# Patient Record
Sex: Female | Born: 1935 | Race: White | Hispanic: No | Smoking: Former smoker
Health system: Southern US, Community
[De-identification: ages and names within clinical notes are randomized; demographics above are authoritative.]

## PROBLEM LIST (undated history)

## (undated) DIAGNOSIS — E785 Hyperlipidemia, unspecified: Secondary | ICD-10-CM

## (undated) DIAGNOSIS — I38 Endocarditis, valve unspecified: Secondary | ICD-10-CM

## (undated) DIAGNOSIS — R011 Cardiac murmur, unspecified: Secondary | ICD-10-CM

## (undated) DIAGNOSIS — K429 Umbilical hernia without obstruction or gangrene: Secondary | ICD-10-CM

## (undated) DIAGNOSIS — E0590 Thyrotoxicosis, unspecified without thyrotoxic crisis or storm: Secondary | ICD-10-CM

## (undated) DIAGNOSIS — M545 Low back pain, unspecified: Secondary | ICD-10-CM

## (undated) DIAGNOSIS — I4891 Unspecified atrial fibrillation: Secondary | ICD-10-CM

## (undated) DIAGNOSIS — Z7901 Long term (current) use of anticoagulants: Secondary | ICD-10-CM

## (undated) DIAGNOSIS — K922 Gastrointestinal hemorrhage, unspecified: Secondary | ICD-10-CM

## (undated) DIAGNOSIS — I5032 Chronic diastolic (congestive) heart failure: Secondary | ICD-10-CM

## (undated) DIAGNOSIS — IMO0001 Reserved for inherently not codable concepts without codable children: Secondary | ICD-10-CM

## (undated) DIAGNOSIS — I219 Acute myocardial infarction, unspecified: Secondary | ICD-10-CM

## (undated) DIAGNOSIS — G4700 Insomnia, unspecified: Secondary | ICD-10-CM

## (undated) DIAGNOSIS — Z8739 Personal history of other diseases of the musculoskeletal system and connective tissue: Secondary | ICD-10-CM

## (undated) DIAGNOSIS — I10 Essential (primary) hypertension: Secondary | ICD-10-CM

## (undated) DIAGNOSIS — L0390 Cellulitis, unspecified: Secondary | ICD-10-CM

## (undated) DIAGNOSIS — G8929 Other chronic pain: Secondary | ICD-10-CM

## (undated) DIAGNOSIS — Z8719 Personal history of other diseases of the digestive system: Secondary | ICD-10-CM

## (undated) DIAGNOSIS — N200 Calculus of kidney: Secondary | ICD-10-CM

## (undated) DIAGNOSIS — K649 Unspecified hemorrhoids: Secondary | ICD-10-CM

## (undated) DIAGNOSIS — F418 Other specified anxiety disorders: Secondary | ICD-10-CM

## (undated) DIAGNOSIS — J189 Pneumonia, unspecified organism: Secondary | ICD-10-CM

## (undated) DIAGNOSIS — Z5189 Encounter for other specified aftercare: Secondary | ICD-10-CM

## (undated) DIAGNOSIS — I739 Peripheral vascular disease, unspecified: Secondary | ICD-10-CM

## (undated) DIAGNOSIS — K219 Gastro-esophageal reflux disease without esophagitis: Secondary | ICD-10-CM

## (undated) DIAGNOSIS — G43909 Migraine, unspecified, not intractable, without status migrainosus: Secondary | ICD-10-CM

## (undated) DIAGNOSIS — R609 Edema, unspecified: Secondary | ICD-10-CM

## (undated) DIAGNOSIS — L0291 Cutaneous abscess, unspecified: Secondary | ICD-10-CM

## (undated) DIAGNOSIS — I495 Sick sinus syndrome: Secondary | ICD-10-CM

## (undated) DIAGNOSIS — N189 Chronic kidney disease, unspecified: Secondary | ICD-10-CM

## (undated) DIAGNOSIS — Z950 Presence of cardiac pacemaker: Secondary | ICD-10-CM

## (undated) DIAGNOSIS — M1990 Unspecified osteoarthritis, unspecified site: Secondary | ICD-10-CM

## (undated) DIAGNOSIS — I2510 Atherosclerotic heart disease of native coronary artery without angina pectoris: Secondary | ICD-10-CM

## (undated) DIAGNOSIS — D509 Iron deficiency anemia, unspecified: Secondary | ICD-10-CM

## (undated) HISTORY — DX: Pneumonia, unspecified organism: J18.9

## (undated) HISTORY — PX: CARDIOVERSION: SHX1299

## (undated) HISTORY — DX: Cutaneous abscess, unspecified: L02.91

## (undated) HISTORY — DX: Long term (current) use of anticoagulants: Z79.01

## (undated) HISTORY — DX: Gastrointestinal hemorrhage, unspecified: K92.2

## (undated) HISTORY — PX: VAGINAL HYSTERECTOMY: SUR661

## (undated) HISTORY — DX: Essential (primary) hypertension: I10

## (undated) HISTORY — DX: Sick sinus syndrome: I49.5

## (undated) HISTORY — DX: Edema, unspecified: R60.9

## (undated) HISTORY — DX: Cellulitis, unspecified: L03.90

## (undated) HISTORY — PX: CORONARY ANGIOPLASTY WITH STENT PLACEMENT: SHX49

## (undated) HISTORY — PX: CHOLECYSTECTOMY: SHX55

## (undated) HISTORY — PX: BACK SURGERY: SHX140

## (undated) HISTORY — DX: Atherosclerotic heart disease of native coronary artery without angina pectoris: I25.10

## (undated) HISTORY — PX: CATARACT EXTRACTION W/ INTRAOCULAR LENS  IMPLANT, BILATERAL: SHX1307

## (undated) HISTORY — DX: Gastro-esophageal reflux disease without esophagitis: K21.9

## (undated) HISTORY — PX: JOINT REPLACEMENT: SHX530

## (undated) HISTORY — PX: COLONOSCOPY: SHX174

## (undated) HISTORY — PX: CARDIAC CATHETERIZATION: SHX172

## (undated) HISTORY — PX: ESOPHAGOGASTRODUODENOSCOPY: SHX1529

## (undated) HISTORY — PX: TONSILLECTOMY: SUR1361

---
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
## 1969-04-15 HISTORY — PX: OTHER SURGICAL HISTORY: SHX169

## 1998-01-18 ENCOUNTER — Emergency Department (HOSPITAL_COMMUNITY): Admission: EM | Admit: 1998-01-18 | Discharge: 1998-01-18 | Payer: Self-pay | Admitting: Emergency Medicine

## 1998-09-21 ENCOUNTER — Ambulatory Visit (HOSPITAL_COMMUNITY): Admission: RE | Admit: 1998-09-21 | Discharge: 1998-09-21 | Payer: Self-pay | Admitting: Family Medicine

## 1998-09-21 ENCOUNTER — Encounter: Payer: Self-pay | Admitting: Family Medicine

## 1999-12-27 ENCOUNTER — Emergency Department (HOSPITAL_COMMUNITY): Admission: EM | Admit: 1999-12-27 | Discharge: 1999-12-27 | Payer: Self-pay | Admitting: *Deleted

## 1999-12-31 ENCOUNTER — Ambulatory Visit (HOSPITAL_COMMUNITY): Admission: RE | Admit: 1999-12-31 | Discharge: 1999-12-31 | Payer: Self-pay | Admitting: Family Medicine

## 1999-12-31 ENCOUNTER — Encounter: Payer: Self-pay | Admitting: Family Medicine

## 2000-01-25 ENCOUNTER — Encounter: Payer: Self-pay | Admitting: Emergency Medicine

## 2000-01-25 ENCOUNTER — Emergency Department (HOSPITAL_COMMUNITY): Admission: EM | Admit: 2000-01-25 | Discharge: 2000-01-26 | Payer: Self-pay | Admitting: Emergency Medicine

## 2002-03-05 ENCOUNTER — Ambulatory Visit (HOSPITAL_COMMUNITY): Admission: RE | Admit: 2002-03-05 | Discharge: 2002-03-05 | Payer: Self-pay | Admitting: *Deleted

## 2002-03-05 ENCOUNTER — Encounter: Payer: Self-pay | Admitting: *Deleted

## 2002-05-02 ENCOUNTER — Encounter: Payer: Self-pay | Admitting: Neurosurgery

## 2002-05-02 ENCOUNTER — Encounter: Admission: RE | Admit: 2002-05-02 | Discharge: 2002-05-02 | Payer: Self-pay | Admitting: Neurosurgery

## 2002-05-22 ENCOUNTER — Ambulatory Visit (HOSPITAL_COMMUNITY): Admission: RE | Admit: 2002-05-22 | Discharge: 2002-05-22 | Payer: Self-pay | Admitting: Unknown Physician Specialty

## 2002-05-22 ENCOUNTER — Encounter: Payer: Self-pay | Admitting: Unknown Physician Specialty

## 2002-06-14 ENCOUNTER — Encounter: Admission: RE | Admit: 2002-06-14 | Discharge: 2002-06-14 | Payer: Self-pay | Admitting: Neurosurgery

## 2002-06-14 ENCOUNTER — Encounter: Payer: Self-pay | Admitting: Neurosurgery

## 2002-06-28 ENCOUNTER — Encounter: Admission: RE | Admit: 2002-06-28 | Discharge: 2002-06-28 | Payer: Self-pay | Admitting: Neurosurgery

## 2002-06-28 ENCOUNTER — Encounter: Payer: Self-pay | Admitting: Neurosurgery

## 2003-05-30 ENCOUNTER — Encounter: Payer: Self-pay | Admitting: Neurosurgery

## 2003-05-30 ENCOUNTER — Encounter: Admission: RE | Admit: 2003-05-30 | Discharge: 2003-05-30 | Payer: Self-pay | Admitting: Neurosurgery

## 2003-06-18 ENCOUNTER — Encounter: Admission: RE | Admit: 2003-06-18 | Discharge: 2003-06-18 | Payer: Self-pay | Admitting: Neurosurgery

## 2003-07-18 ENCOUNTER — Encounter: Admission: RE | Admit: 2003-07-18 | Discharge: 2003-07-18 | Payer: Self-pay | Admitting: Neurosurgery

## 2003-08-12 ENCOUNTER — Ambulatory Visit: Admission: RE | Admit: 2003-08-12 | Discharge: 2003-08-12 | Payer: Self-pay | Admitting: Neurosurgery

## 2003-08-16 HISTORY — PX: LAMINECTOMY AND MICRODISCECTOMY LUMBAR SPINE: SHX1913

## 2003-09-05 ENCOUNTER — Ambulatory Visit (HOSPITAL_COMMUNITY): Admission: RE | Admit: 2003-09-05 | Discharge: 2003-09-06 | Payer: Self-pay | Admitting: Neurosurgery

## 2004-03-29 ENCOUNTER — Emergency Department (HOSPITAL_COMMUNITY): Admission: EM | Admit: 2004-03-29 | Discharge: 2004-03-29 | Payer: Self-pay | Admitting: Emergency Medicine

## 2004-10-08 ENCOUNTER — Inpatient Hospital Stay (HOSPITAL_COMMUNITY): Admission: EM | Admit: 2004-10-08 | Discharge: 2004-10-16 | Payer: Self-pay | Admitting: Emergency Medicine

## 2004-10-08 ENCOUNTER — Ambulatory Visit: Payer: Self-pay | Admitting: Internal Medicine

## 2004-10-11 ENCOUNTER — Encounter: Payer: Self-pay | Admitting: Internal Medicine

## 2004-10-18 ENCOUNTER — Emergency Department (HOSPITAL_COMMUNITY): Admission: EM | Admit: 2004-10-18 | Discharge: 2004-10-18 | Payer: Self-pay | Admitting: Emergency Medicine

## 2004-10-25 ENCOUNTER — Ambulatory Visit: Payer: Self-pay

## 2004-10-25 ENCOUNTER — Ambulatory Visit: Payer: Self-pay | Admitting: Cardiovascular Disease

## 2004-11-02 ENCOUNTER — Inpatient Hospital Stay (HOSPITAL_COMMUNITY): Admission: EM | Admit: 2004-11-02 | Discharge: 2004-11-08 | Payer: Self-pay | Admitting: *Deleted

## 2004-11-02 ENCOUNTER — Ambulatory Visit: Payer: Self-pay | Admitting: Internal Medicine

## 2004-11-05 ENCOUNTER — Ambulatory Visit: Payer: Self-pay | Admitting: Cardiology

## 2004-11-10 ENCOUNTER — Ambulatory Visit: Payer: Self-pay | Admitting: *Deleted

## 2004-11-12 ENCOUNTER — Ambulatory Visit: Payer: Self-pay | Admitting: Cardiology

## 2004-11-12 ENCOUNTER — Ambulatory Visit: Payer: Self-pay | Admitting: Cardiovascular Disease

## 2004-12-20 ENCOUNTER — Ambulatory Visit: Payer: Self-pay | Admitting: Cardiovascular Disease

## 2004-12-22 ENCOUNTER — Ambulatory Visit: Payer: Self-pay | Admitting: Cardiovascular Disease

## 2004-12-27 ENCOUNTER — Ambulatory Visit: Payer: Self-pay | Admitting: Cardiology

## 2004-12-28 ENCOUNTER — Ambulatory Visit: Payer: Self-pay | Admitting: Cardiology

## 2004-12-28 ENCOUNTER — Inpatient Hospital Stay (HOSPITAL_BASED_OUTPATIENT_CLINIC_OR_DEPARTMENT_OTHER): Admission: RE | Admit: 2004-12-28 | Discharge: 2004-12-28 | Payer: Self-pay | Admitting: Cardiology

## 2004-12-31 ENCOUNTER — Ambulatory Visit: Payer: Self-pay | Admitting: Cardiovascular Disease

## 2005-01-04 ENCOUNTER — Ambulatory Visit: Payer: Self-pay | Admitting: Internal Medicine

## 2005-01-06 ENCOUNTER — Ambulatory Visit: Payer: Self-pay | Admitting: Cardiology

## 2005-01-13 ENCOUNTER — Ambulatory Visit: Payer: Self-pay | Admitting: Cardiovascular Disease

## 2005-01-17 ENCOUNTER — Ambulatory Visit: Payer: Self-pay | Admitting: Cardiovascular Disease

## 2005-01-19 ENCOUNTER — Inpatient Hospital Stay (HOSPITAL_COMMUNITY): Admission: RE | Admit: 2005-01-19 | Discharge: 2005-01-20 | Payer: Self-pay | Admitting: Cardiology

## 2005-01-26 ENCOUNTER — Ambulatory Visit: Payer: Self-pay | Admitting: Cardiovascular Disease

## 2005-02-03 ENCOUNTER — Ambulatory Visit: Payer: Self-pay | Admitting: Cardiovascular Disease

## 2005-02-08 ENCOUNTER — Ambulatory Visit: Payer: Self-pay | Admitting: Cardiovascular Disease

## 2005-02-23 ENCOUNTER — Ambulatory Visit: Payer: Self-pay | Admitting: Internal Medicine

## 2005-03-03 ENCOUNTER — Ambulatory Visit: Payer: Self-pay | Admitting: Cardiology

## 2005-03-03 ENCOUNTER — Ambulatory Visit: Payer: Self-pay

## 2005-03-07 ENCOUNTER — Ambulatory Visit: Payer: Self-pay | Admitting: Cardiology

## 2005-03-11 ENCOUNTER — Ambulatory Visit: Payer: Self-pay | Admitting: Cardiology

## 2005-03-18 ENCOUNTER — Ambulatory Visit: Payer: Self-pay | Admitting: Cardiovascular Disease

## 2005-03-28 ENCOUNTER — Ambulatory Visit: Payer: Self-pay | Admitting: Cardiology

## 2005-04-08 ENCOUNTER — Ambulatory Visit: Payer: Self-pay | Admitting: Internal Medicine

## 2005-04-08 ENCOUNTER — Emergency Department (HOSPITAL_COMMUNITY): Admission: EM | Admit: 2005-04-08 | Discharge: 2005-04-08 | Payer: Self-pay | Admitting: Emergency Medicine

## 2005-04-15 ENCOUNTER — Ambulatory Visit: Payer: Self-pay | Admitting: Cardiology

## 2005-05-09 ENCOUNTER — Ambulatory Visit: Payer: Self-pay | Admitting: Cardiology

## 2005-06-08 ENCOUNTER — Ambulatory Visit: Payer: Self-pay | Admitting: Cardiology

## 2005-06-17 ENCOUNTER — Ambulatory Visit: Payer: Self-pay | Admitting: Cardiology

## 2005-09-30 ENCOUNTER — Ambulatory Visit: Payer: Self-pay | Admitting: Cardiovascular Disease

## 2005-10-05 ENCOUNTER — Ambulatory Visit: Payer: Self-pay | Admitting: Cardiovascular Disease

## 2005-12-12 ENCOUNTER — Ambulatory Visit: Payer: Self-pay | Admitting: Cardiovascular Disease

## 2005-12-12 ENCOUNTER — Ambulatory Visit: Payer: Self-pay | Admitting: Cardiology

## 2006-05-02 ENCOUNTER — Ambulatory Visit: Payer: Self-pay | Admitting: Cardiovascular Disease

## 2006-06-30 ENCOUNTER — Ambulatory Visit: Payer: Self-pay | Admitting: Cardiovascular Disease

## 2006-07-27 ENCOUNTER — Ambulatory Visit: Payer: Self-pay

## 2006-08-29 ENCOUNTER — Ambulatory Visit: Payer: Self-pay | Admitting: Cardiovascular Disease

## 2006-09-15 ENCOUNTER — Ambulatory Visit: Payer: Self-pay | Admitting: Cardiovascular Disease

## 2006-09-15 ENCOUNTER — Ambulatory Visit (HOSPITAL_COMMUNITY): Admission: RE | Admit: 2006-09-15 | Discharge: 2006-09-15 | Payer: Self-pay | Admitting: Cardiovascular Disease

## 2006-09-21 ENCOUNTER — Ambulatory Visit: Payer: Self-pay | Admitting: Cardiovascular Disease

## 2006-10-26 ENCOUNTER — Ambulatory Visit: Payer: Self-pay | Admitting: Cardiovascular Disease

## 2006-10-26 LAB — CONVERTED CEMR LAB
BUN: 25 mg/dL — ABNORMAL HIGH (ref 6–23)
Basophils Absolute: 0 10*3/uL (ref 0.0–0.1)
Calcium: 9.3 mg/dL (ref 8.4–10.5)
Chloride: 106 meq/L (ref 96–112)
Creatinine, Ser: 2 mg/dL — ABNORMAL HIGH (ref 0.4–1.2)
INR: 0.8 — ABNORMAL LOW (ref 0.9–2.0)
MCHC: 33.6 g/dL (ref 30.0–36.0)
Monocytes Relative: 8.9 % (ref 3.0–11.0)
Pro B Natriuretic peptide (BNP): 196 pg/mL — ABNORMAL HIGH (ref 0.0–100.0)
Prothrombin Time: 11.3 s (ref 10.0–14.0)
RBC: 4.88 M/uL (ref 3.87–5.11)
RDW: 14.2 % (ref 11.5–14.6)
aPTT: 33.5 s (ref 26.5–36.5)

## 2006-10-31 ENCOUNTER — Inpatient Hospital Stay (HOSPITAL_BASED_OUTPATIENT_CLINIC_OR_DEPARTMENT_OTHER): Admission: RE | Admit: 2006-10-31 | Discharge: 2006-10-31 | Payer: Self-pay | Admitting: Cardiovascular Disease

## 2006-10-31 ENCOUNTER — Ambulatory Visit: Payer: Self-pay | Admitting: Cardiovascular Disease

## 2006-11-23 ENCOUNTER — Ambulatory Visit: Payer: Self-pay | Admitting: Cardiovascular Disease

## 2007-01-14 HISTORY — PX: INSERT / REPLACE / REMOVE PACEMAKER: SUR710

## 2007-01-19 ENCOUNTER — Ambulatory Visit: Payer: Self-pay | Admitting: Cardiovascular Disease

## 2007-01-19 ENCOUNTER — Inpatient Hospital Stay (HOSPITAL_COMMUNITY): Admission: EM | Admit: 2007-01-19 | Discharge: 2007-01-23 | Payer: Self-pay | Admitting: Emergency Medicine

## 2007-01-30 ENCOUNTER — Ambulatory Visit: Payer: Self-pay | Admitting: Cardiology

## 2007-01-30 LAB — CONVERTED CEMR LAB
INR: 5 — ABNORMAL HIGH (ref 0.9–2.0)
Prothrombin Time: 28.9 s — ABNORMAL HIGH (ref 10.0–14.0)

## 2007-02-05 ENCOUNTER — Ambulatory Visit: Payer: Self-pay | Admitting: Cardiovascular Disease

## 2007-02-05 ENCOUNTER — Ambulatory Visit: Payer: Self-pay | Admitting: Cardiology

## 2007-02-05 ENCOUNTER — Ambulatory Visit: Payer: Self-pay | Admitting: Internal Medicine

## 2007-02-05 ENCOUNTER — Inpatient Hospital Stay (HOSPITAL_COMMUNITY): Admission: AD | Admit: 2007-02-05 | Discharge: 2007-02-14 | Payer: Self-pay | Admitting: Cardiovascular Disease

## 2007-02-12 ENCOUNTER — Ambulatory Visit: Payer: Self-pay | Admitting: Gastroenterology

## 2007-02-13 HISTORY — PX: AV NODE ABLATION: SHX1209

## 2007-02-26 ENCOUNTER — Inpatient Hospital Stay (HOSPITAL_COMMUNITY): Admission: EM | Admit: 2007-02-26 | Discharge: 2007-03-03 | Payer: Self-pay | Admitting: *Deleted

## 2007-02-26 ENCOUNTER — Ambulatory Visit: Payer: Self-pay | Admitting: Cardiology

## 2007-02-26 ENCOUNTER — Ambulatory Visit: Payer: Self-pay | Admitting: Cardiovascular Disease

## 2007-02-27 ENCOUNTER — Encounter: Payer: Self-pay | Admitting: Cardiovascular Disease

## 2007-03-02 ENCOUNTER — Encounter: Payer: Self-pay | Admitting: Cardiovascular Disease

## 2007-03-09 ENCOUNTER — Ambulatory Visit: Payer: Self-pay | Admitting: Cardiology

## 2007-03-16 ENCOUNTER — Ambulatory Visit: Payer: Self-pay | Admitting: Internal Medicine

## 2007-03-23 ENCOUNTER — Ambulatory Visit: Payer: Self-pay | Admitting: Gastroenterology

## 2007-03-28 ENCOUNTER — Ambulatory Visit: Payer: Self-pay | Admitting: Internal Medicine

## 2007-04-02 ENCOUNTER — Ambulatory Visit: Payer: Self-pay | Admitting: Internal Medicine

## 2007-04-02 LAB — CONVERTED CEMR LAB
BUN: 27 mg/dL — ABNORMAL HIGH (ref 6–23)
CO2: 29 meq/L (ref 19–32)
GFR calc Af Amer: 30 mL/min
Potassium: 3.8 meq/L (ref 3.5–5.1)

## 2007-04-05 ENCOUNTER — Encounter: Payer: Self-pay | Admitting: Endocrinology

## 2007-04-06 ENCOUNTER — Ambulatory Visit: Payer: Self-pay | Admitting: Gastroenterology

## 2007-04-10 ENCOUNTER — Ambulatory Visit: Payer: Self-pay | Admitting: Cardiology

## 2007-04-12 ENCOUNTER — Encounter: Payer: Self-pay | Admitting: Gastroenterology

## 2007-05-04 ENCOUNTER — Ambulatory Visit: Payer: Self-pay

## 2007-05-05 ENCOUNTER — Emergency Department (HOSPITAL_COMMUNITY): Admission: EM | Admit: 2007-05-05 | Discharge: 2007-05-05 | Payer: Self-pay | Admitting: Emergency Medicine

## 2007-05-11 ENCOUNTER — Ambulatory Visit: Payer: Self-pay | Admitting: Cardiovascular Disease

## 2007-05-14 DIAGNOSIS — I10 Essential (primary) hypertension: Secondary | ICD-10-CM

## 2007-05-14 DIAGNOSIS — F329 Major depressive disorder, single episode, unspecified: Secondary | ICD-10-CM

## 2007-05-14 DIAGNOSIS — K259 Gastric ulcer, unspecified as acute or chronic, without hemorrhage or perforation: Secondary | ICD-10-CM

## 2007-05-14 DIAGNOSIS — K219 Gastro-esophageal reflux disease without esophagitis: Secondary | ICD-10-CM | POA: Insufficient documentation

## 2007-05-14 DIAGNOSIS — I4891 Unspecified atrial fibrillation: Secondary | ICD-10-CM | POA: Insufficient documentation

## 2007-05-14 DIAGNOSIS — I509 Heart failure, unspecified: Secondary | ICD-10-CM

## 2007-05-14 DIAGNOSIS — N259 Disorder resulting from impaired renal tubular function, unspecified: Secondary | ICD-10-CM | POA: Insufficient documentation

## 2007-06-16 HISTORY — PX: INSERT / REPLACE / REMOVE PACEMAKER: SUR710

## 2007-06-26 ENCOUNTER — Ambulatory Visit: Payer: Self-pay | Admitting: Internal Medicine

## 2007-06-26 ENCOUNTER — Ambulatory Visit: Payer: Self-pay | Admitting: Cardiovascular Disease

## 2007-06-26 LAB — CONVERTED CEMR LAB
BUN: 32 mg/dL — ABNORMAL HIGH (ref 6–23)
Basophils Relative: 1.4 % — ABNORMAL HIGH (ref 0.0–1.0)
CO2: 26 meq/L (ref 19–32)
Calcium: 9.4 mg/dL (ref 8.4–10.5)
Eosinophils Absolute: 0.1 10*3/uL (ref 0.0–0.6)
GFR calc Af Amer: 27 mL/min
GFR calc non Af Amer: 22 mL/min
INR: 1 (ref 0.8–1.0)
Lymphocytes Relative: 22.3 % (ref 12.0–46.0)
Monocytes Relative: 10.1 % (ref 3.0–11.0)
Neutro Abs: 3.4 10*3/uL (ref 1.4–7.7)
Platelets: 262 10*3/uL (ref 150–400)
Prothrombin Time: 12 s (ref 10.9–13.3)
aPTT: 26.1 s (ref 21.7–29.8)

## 2007-07-05 ENCOUNTER — Ambulatory Visit: Payer: Self-pay | Admitting: Internal Medicine

## 2007-07-05 ENCOUNTER — Observation Stay (HOSPITAL_COMMUNITY): Admission: RE | Admit: 2007-07-05 | Discharge: 2007-07-06 | Payer: Self-pay | Admitting: Internal Medicine

## 2007-07-19 ENCOUNTER — Ambulatory Visit: Payer: Self-pay

## 2007-08-01 ENCOUNTER — Emergency Department (HOSPITAL_COMMUNITY): Admission: EM | Admit: 2007-08-01 | Discharge: 2007-08-02 | Payer: Self-pay | Admitting: Emergency Medicine

## 2007-08-02 ENCOUNTER — Encounter: Payer: Self-pay | Admitting: Endocrinology

## 2007-08-07 ENCOUNTER — Ambulatory Visit: Payer: Self-pay | Admitting: Endocrinology

## 2007-08-07 DIAGNOSIS — E0590 Thyrotoxicosis, unspecified without thyrotoxic crisis or storm: Secondary | ICD-10-CM

## 2007-08-15 ENCOUNTER — Ambulatory Visit: Payer: Self-pay | Admitting: Cardiology

## 2007-08-15 ENCOUNTER — Ambulatory Visit: Payer: Self-pay | Admitting: Cardiovascular Disease

## 2007-08-15 LAB — CONVERTED CEMR LAB
Sed Rate: 33 mm/hr — ABNORMAL HIGH (ref 0–25)
Total CK: 49 units/L (ref 7–177)

## 2007-08-29 ENCOUNTER — Encounter: Admission: RE | Admit: 2007-08-29 | Discharge: 2007-08-29 | Payer: Self-pay | Admitting: Endocrinology

## 2007-09-10 ENCOUNTER — Ambulatory Visit: Payer: Self-pay | Admitting: Internal Medicine

## 2007-09-16 HISTORY — PX: AV FISTULA PLACEMENT, BRACHIOCEPHALIC: SHX1207

## 2007-09-18 ENCOUNTER — Ambulatory Visit: Payer: Self-pay | Admitting: Endocrinology

## 2007-09-20 ENCOUNTER — Encounter (HOSPITAL_COMMUNITY): Admission: RE | Admit: 2007-09-20 | Discharge: 2007-12-19 | Payer: Self-pay | Admitting: Nephrology

## 2007-09-24 ENCOUNTER — Ambulatory Visit: Payer: Self-pay | Admitting: Surgery

## 2007-09-25 ENCOUNTER — Ambulatory Visit: Payer: Self-pay | Admitting: Cardiovascular Disease

## 2007-10-12 ENCOUNTER — Ambulatory Visit: Payer: Self-pay | Admitting: *Deleted

## 2007-10-12 ENCOUNTER — Ambulatory Visit (HOSPITAL_COMMUNITY): Admission: RE | Admit: 2007-10-12 | Discharge: 2007-10-12 | Payer: Self-pay | Admitting: Surgery

## 2007-10-25 ENCOUNTER — Ambulatory Visit: Payer: Self-pay | Admitting: *Deleted

## 2007-11-08 ENCOUNTER — Ambulatory Visit: Payer: Self-pay | Admitting: *Deleted

## 2007-11-09 ENCOUNTER — Ambulatory Visit: Payer: Self-pay | Admitting: Internal Medicine

## 2008-02-19 ENCOUNTER — Ambulatory Visit: Payer: Self-pay | Admitting: Cardiovascular Disease

## 2008-02-19 ENCOUNTER — Inpatient Hospital Stay (HOSPITAL_COMMUNITY): Admission: EM | Admit: 2008-02-19 | Discharge: 2008-02-26 | Payer: Self-pay | Admitting: Emergency Medicine

## 2008-02-20 ENCOUNTER — Encounter: Payer: Self-pay | Admitting: Cardiovascular Disease

## 2008-03-10 ENCOUNTER — Ambulatory Visit: Payer: Self-pay | Admitting: Cardiovascular Disease

## 2008-04-08 ENCOUNTER — Ambulatory Visit: Payer: Self-pay | Admitting: Cardiovascular Disease

## 2008-07-01 ENCOUNTER — Ambulatory Visit: Payer: Self-pay | Admitting: Internal Medicine

## 2008-09-09 ENCOUNTER — Ambulatory Visit: Payer: Self-pay | Admitting: Cardiovascular Disease

## 2008-11-10 ENCOUNTER — Encounter: Admission: RE | Admit: 2008-11-10 | Discharge: 2008-11-10 | Payer: Self-pay | Admitting: Orthopedic Surgery

## 2008-11-20 ENCOUNTER — Encounter: Payer: Self-pay | Admitting: Internal Medicine

## 2008-12-25 DIAGNOSIS — Z950 Presence of cardiac pacemaker: Secondary | ICD-10-CM | POA: Insufficient documentation

## 2008-12-25 DIAGNOSIS — L0291 Cutaneous abscess, unspecified: Secondary | ICD-10-CM | POA: Insufficient documentation

## 2008-12-25 DIAGNOSIS — I2510 Atherosclerotic heart disease of native coronary artery without angina pectoris: Secondary | ICD-10-CM | POA: Insufficient documentation

## 2008-12-25 DIAGNOSIS — D649 Anemia, unspecified: Secondary | ICD-10-CM | POA: Insufficient documentation

## 2008-12-25 DIAGNOSIS — R609 Edema, unspecified: Secondary | ICD-10-CM | POA: Insufficient documentation

## 2008-12-25 DIAGNOSIS — L0390 Cellulitis, unspecified: Secondary | ICD-10-CM

## 2009-01-13 HISTORY — PX: TOTAL KNEE ARTHROPLASTY: SHX125

## 2009-01-15 ENCOUNTER — Ambulatory Visit: Payer: Self-pay

## 2009-01-15 ENCOUNTER — Telehealth (INDEPENDENT_AMBULATORY_CARE_PROVIDER_SITE_OTHER): Payer: Self-pay

## 2009-01-19 ENCOUNTER — Ambulatory Visit: Payer: Self-pay

## 2009-01-19 ENCOUNTER — Ambulatory Visit: Payer: Self-pay | Admitting: Cardiovascular Disease

## 2009-01-26 ENCOUNTER — Telehealth: Payer: Self-pay | Admitting: Cardiovascular Disease

## 2009-02-10 ENCOUNTER — Telehealth: Payer: Self-pay | Admitting: Cardiovascular Disease

## 2009-02-24 ENCOUNTER — Telehealth (INDEPENDENT_AMBULATORY_CARE_PROVIDER_SITE_OTHER): Payer: Self-pay | Admitting: *Deleted

## 2009-02-27 ENCOUNTER — Ambulatory Visit: Payer: Self-pay | Admitting: Cardiology

## 2009-02-27 ENCOUNTER — Encounter (INDEPENDENT_AMBULATORY_CARE_PROVIDER_SITE_OTHER): Payer: Self-pay | Admitting: Orthopedic Surgery

## 2009-02-27 ENCOUNTER — Inpatient Hospital Stay (HOSPITAL_COMMUNITY): Admission: RE | Admit: 2009-02-27 | Discharge: 2009-03-05 | Payer: Self-pay | Admitting: Orthopedic Surgery

## 2009-03-02 ENCOUNTER — Encounter: Payer: Self-pay | Admitting: Cardiology

## 2009-03-03 ENCOUNTER — Encounter (INDEPENDENT_AMBULATORY_CARE_PROVIDER_SITE_OTHER): Payer: Self-pay | Admitting: *Deleted

## 2009-03-04 ENCOUNTER — Encounter (INDEPENDENT_AMBULATORY_CARE_PROVIDER_SITE_OTHER): Payer: Self-pay | Admitting: Orthopedic Surgery

## 2009-03-04 ENCOUNTER — Ambulatory Visit: Payer: Self-pay | Admitting: *Deleted

## 2009-03-25 ENCOUNTER — Encounter: Payer: Self-pay | Admitting: Cardiology

## 2009-03-25 LAB — CONVERTED CEMR LAB: Prothrombin Time: 11.6 s

## 2009-03-30 ENCOUNTER — Encounter: Payer: Self-pay | Admitting: Cardiovascular Disease

## 2009-03-30 LAB — CONVERTED CEMR LAB
POC INR: 1.3
Prothrombin Time: 14.2 s

## 2009-04-06 ENCOUNTER — Encounter: Payer: Self-pay | Admitting: Cardiology

## 2009-04-06 LAB — CONVERTED CEMR LAB
POC INR: 1.3
Prothrombin Time: 14.3 s

## 2009-04-13 ENCOUNTER — Encounter: Payer: Self-pay | Admitting: Cardiology

## 2009-04-21 ENCOUNTER — Encounter: Payer: Self-pay | Admitting: Cardiology

## 2009-04-21 ENCOUNTER — Telehealth: Payer: Self-pay | Admitting: Cardiovascular Disease

## 2009-04-21 ENCOUNTER — Ambulatory Visit: Payer: Self-pay | Admitting: Cardiovascular Disease

## 2009-04-23 ENCOUNTER — Encounter: Payer: Self-pay | Admitting: Cardiovascular Disease

## 2009-04-28 ENCOUNTER — Ambulatory Visit: Payer: Self-pay | Admitting: Cardiology

## 2009-05-04 ENCOUNTER — Ambulatory Visit: Payer: Self-pay | Admitting: Cardiology

## 2009-05-15 ENCOUNTER — Ambulatory Visit: Payer: Self-pay | Admitting: Internal Medicine

## 2009-05-18 ENCOUNTER — Encounter: Payer: Self-pay | Admitting: Cardiovascular Disease

## 2009-05-28 ENCOUNTER — Ambulatory Visit: Payer: Self-pay | Admitting: Cardiology

## 2009-05-28 LAB — CONVERTED CEMR LAB: POC INR: 1.7

## 2009-05-29 ENCOUNTER — Encounter (INDEPENDENT_AMBULATORY_CARE_PROVIDER_SITE_OTHER): Payer: Self-pay | Admitting: *Deleted

## 2009-06-10 ENCOUNTER — Encounter (INDEPENDENT_AMBULATORY_CARE_PROVIDER_SITE_OTHER): Payer: Self-pay | Admitting: *Deleted

## 2009-06-11 ENCOUNTER — Ambulatory Visit: Payer: Self-pay | Admitting: Cardiology

## 2009-06-11 LAB — CONVERTED CEMR LAB: POC INR: 2.9

## 2009-06-24 ENCOUNTER — Encounter (HOSPITAL_COMMUNITY): Admission: RE | Admit: 2009-06-24 | Discharge: 2009-08-14 | Payer: Self-pay | Admitting: Nephrology

## 2009-06-25 ENCOUNTER — Ambulatory Visit: Payer: Self-pay | Admitting: Cardiovascular Disease

## 2009-06-25 ENCOUNTER — Ambulatory Visit: Payer: Self-pay | Admitting: Internal Medicine

## 2009-07-23 ENCOUNTER — Ambulatory Visit: Payer: Self-pay | Admitting: Cardiovascular Disease

## 2009-07-23 LAB — CONVERTED CEMR LAB: POC INR: 2.2

## 2009-08-03 ENCOUNTER — Ambulatory Visit: Payer: Self-pay | Admitting: Gastroenterology

## 2009-08-03 DIAGNOSIS — R195 Other fecal abnormalities: Secondary | ICD-10-CM

## 2009-08-03 LAB — CONVERTED CEMR LAB
ALT: 20 units/L (ref 0–35)
Albumin: 3.7 g/dL (ref 3.5–5.2)
Alkaline Phosphatase: 80 units/L (ref 39–117)
Basophils Absolute: 0 10*3/uL (ref 0.0–0.1)
CO2: 27 meq/L (ref 19–32)
Eosinophils Relative: 2.3 % (ref 0.0–5.0)
Glucose, Bld: 84 mg/dL (ref 70–99)
Monocytes Absolute: 0.5 10*3/uL (ref 0.1–1.0)
Monocytes Relative: 6.7 % (ref 3.0–12.0)
Neutrophils Relative %: 73 % (ref 43.0–77.0)
Platelets: 183 10*3/uL (ref 150.0–400.0)
Potassium: 3.9 meq/L (ref 3.5–5.1)
Prothrombin Time: 25.2 s — ABNORMAL HIGH (ref 9.1–11.7)
RDW: 15.4 % — ABNORMAL HIGH (ref 11.5–14.6)
Sodium: 137 meq/L (ref 135–145)
Total Protein: 7.6 g/dL (ref 6.0–8.3)
WBC: 6.9 10*3/uL (ref 4.5–10.5)

## 2009-08-12 ENCOUNTER — Encounter: Payer: Self-pay | Admitting: Cardiovascular Disease

## 2009-08-13 ENCOUNTER — Telehealth: Payer: Self-pay | Admitting: Cardiovascular Disease

## 2009-08-19 ENCOUNTER — Ambulatory Visit: Payer: Self-pay | Admitting: Cardiovascular Disease

## 2009-08-31 ENCOUNTER — Ambulatory Visit: Payer: Self-pay | Admitting: Gastroenterology

## 2009-09-02 ENCOUNTER — Ambulatory Visit: Payer: Self-pay | Admitting: Cardiovascular Disease

## 2009-09-02 LAB — CONVERTED CEMR LAB: POC INR: 1.1

## 2009-09-04 ENCOUNTER — Telehealth: Payer: Self-pay | Admitting: Gastroenterology

## 2009-09-09 ENCOUNTER — Ambulatory Visit: Payer: Self-pay | Admitting: Cardiology

## 2009-09-09 LAB — CONVERTED CEMR LAB: POC INR: 1.2

## 2009-09-15 ENCOUNTER — Telehealth: Payer: Self-pay | Admitting: Gastroenterology

## 2009-09-23 ENCOUNTER — Ambulatory Visit: Payer: Self-pay | Admitting: Cardiology

## 2009-09-23 LAB — CONVERTED CEMR LAB: POC INR: 2.1

## 2009-10-02 ENCOUNTER — Ambulatory Visit: Payer: Self-pay | Admitting: Cardiology

## 2009-10-02 LAB — CONVERTED CEMR LAB: POC INR: 3.6

## 2009-10-15 ENCOUNTER — Ambulatory Visit: Payer: Self-pay | Admitting: Cardiology

## 2009-10-15 LAB — CONVERTED CEMR LAB: POC INR: 3.3

## 2009-10-29 ENCOUNTER — Ambulatory Visit: Payer: Self-pay | Admitting: Internal Medicine

## 2009-10-29 LAB — CONVERTED CEMR LAB: POC INR: 4.6

## 2009-11-04 ENCOUNTER — Encounter: Payer: Self-pay | Admitting: Cardiovascular Disease

## 2009-11-12 ENCOUNTER — Telehealth: Payer: Self-pay | Admitting: Gastroenterology

## 2009-11-24 ENCOUNTER — Ambulatory Visit: Payer: Self-pay | Admitting: Gastroenterology

## 2009-11-24 DIAGNOSIS — R197 Diarrhea, unspecified: Secondary | ICD-10-CM

## 2009-11-25 ENCOUNTER — Encounter (INDEPENDENT_AMBULATORY_CARE_PROVIDER_SITE_OTHER): Payer: Self-pay | Admitting: *Deleted

## 2009-11-25 ENCOUNTER — Telehealth (INDEPENDENT_AMBULATORY_CARE_PROVIDER_SITE_OTHER): Payer: Self-pay | Admitting: *Deleted

## 2009-11-25 LAB — CONVERTED CEMR LAB
Basophils Absolute: 0 10*3/uL (ref 0.0–0.1)
CO2: 29 meq/L (ref 19–32)
Chloride: 101 meq/L (ref 96–112)
Creatinine, Ser: 2.1 mg/dL — ABNORMAL HIGH (ref 0.4–1.2)
Eosinophils Absolute: 0.2 10*3/uL (ref 0.0–0.7)
Hemoglobin: 10.7 g/dL — ABNORMAL LOW (ref 12.0–15.0)
INR: 1.4 — ABNORMAL HIGH (ref 0.8–1.0)
Lymphocytes Relative: 24.2 % (ref 12.0–46.0)
MCHC: 33.8 g/dL (ref 30.0–36.0)
Monocytes Relative: 11.6 % (ref 3.0–12.0)
Neutrophils Relative %: 60.1 % (ref 43.0–77.0)
Platelets: 220 10*3/uL (ref 150.0–400.0)
Potassium: 3.8 meq/L (ref 3.5–5.1)
Prothrombin Time: 14.4 s — ABNORMAL HIGH (ref 9.1–11.7)
RDW: 16.7 % — ABNORMAL HIGH (ref 11.5–14.6)
Sodium: 142 meq/L (ref 135–145)

## 2009-11-26 ENCOUNTER — Encounter: Payer: Self-pay | Admitting: Cardiovascular Disease

## 2009-11-26 ENCOUNTER — Ambulatory Visit (HOSPITAL_COMMUNITY): Admission: RE | Admit: 2009-11-26 | Discharge: 2009-11-26 | Payer: Self-pay | Admitting: Gastroenterology

## 2009-11-26 ENCOUNTER — Ambulatory Visit: Payer: Self-pay | Admitting: Gastroenterology

## 2009-11-30 ENCOUNTER — Telehealth: Payer: Self-pay | Admitting: Gastroenterology

## 2009-12-02 ENCOUNTER — Telehealth: Payer: Self-pay | Admitting: Cardiovascular Disease

## 2009-12-03 ENCOUNTER — Encounter: Payer: Self-pay | Admitting: Cardiology

## 2009-12-08 ENCOUNTER — Telehealth: Payer: Self-pay | Admitting: Gastroenterology

## 2009-12-09 ENCOUNTER — Ambulatory Visit: Payer: Self-pay | Admitting: Gastroenterology

## 2009-12-09 ENCOUNTER — Telehealth: Payer: Self-pay | Admitting: Cardiovascular Disease

## 2009-12-09 ENCOUNTER — Telehealth (INDEPENDENT_AMBULATORY_CARE_PROVIDER_SITE_OTHER): Payer: Self-pay | Admitting: *Deleted

## 2009-12-10 ENCOUNTER — Telehealth: Payer: Self-pay | Admitting: Cardiovascular Disease

## 2009-12-10 ENCOUNTER — Ambulatory Visit: Payer: Self-pay | Admitting: Internal Medicine

## 2009-12-10 DIAGNOSIS — K921 Melena: Secondary | ICD-10-CM | POA: Insufficient documentation

## 2009-12-10 LAB — CONVERTED CEMR LAB
Basophils Absolute: 0 10*3/uL (ref 0.0–0.1)
Eosinophils Relative: 3.6 % (ref 0.0–5.0)
HCT: 32.2 % — ABNORMAL LOW (ref 36.0–46.0)
Hemoglobin: 11.1 g/dL — ABNORMAL LOW (ref 12.0–15.0)
Lymphocytes Relative: 18.3 % (ref 12.0–46.0)
Lymphs Abs: 0.8 10*3/uL (ref 0.7–4.0)
Monocytes Relative: 8.7 % (ref 3.0–12.0)
Neutro Abs: 3.2 10*3/uL (ref 1.4–7.7)
RDW: 15.8 % — ABNORMAL HIGH (ref 11.5–14.6)
WBC: 4.6 10*3/uL (ref 4.5–10.5)

## 2009-12-15 ENCOUNTER — Encounter: Payer: Self-pay | Admitting: Physician Assistant

## 2009-12-22 ENCOUNTER — Ambulatory Visit: Payer: Self-pay | Admitting: Cardiovascular Disease

## 2009-12-22 ENCOUNTER — Ambulatory Visit: Payer: Self-pay | Admitting: Gastroenterology

## 2009-12-22 LAB — CONVERTED CEMR LAB: POC INR: 2.5

## 2010-01-13 ENCOUNTER — Ambulatory Visit: Payer: Self-pay | Admitting: Cardiology

## 2010-01-13 ENCOUNTER — Ambulatory Visit: Payer: Self-pay | Admitting: Cardiovascular Disease

## 2010-01-18 ENCOUNTER — Ambulatory Visit: Payer: Self-pay | Admitting: Gastroenterology

## 2010-01-19 LAB — CONVERTED CEMR LAB
Basophils Relative: 0.7 % (ref 0.0–3.0)
Eosinophils Relative: 2 % (ref 0.0–5.0)
Hemoglobin: 11.4 g/dL — ABNORMAL LOW (ref 12.0–15.0)
Lymphocytes Relative: 19.6 % (ref 12.0–46.0)
MCHC: 33.5 g/dL (ref 30.0–36.0)
MCV: 80.9 fL (ref 78.0–100.0)
Neutro Abs: 3.4 10*3/uL (ref 1.4–7.7)
Neutrophils Relative %: 69.3 % (ref 43.0–77.0)
RBC: 4.2 M/uL (ref 3.87–5.11)
WBC: 4.9 10*3/uL (ref 4.5–10.5)

## 2010-02-10 ENCOUNTER — Ambulatory Visit: Payer: Self-pay | Admitting: Cardiology

## 2010-02-17 ENCOUNTER — Telehealth: Payer: Self-pay | Admitting: Gastroenterology

## 2010-03-02 ENCOUNTER — Ambulatory Visit: Payer: Self-pay | Admitting: Internal Medicine

## 2010-03-02 ENCOUNTER — Encounter (HOSPITAL_COMMUNITY): Admission: RE | Admit: 2010-03-02 | Discharge: 2010-05-06 | Payer: Self-pay | Admitting: Nephrology

## 2010-03-02 LAB — CONVERTED CEMR LAB: POC INR: 2.8

## 2010-03-08 ENCOUNTER — Encounter: Payer: Self-pay | Admitting: Cardiovascular Disease

## 2010-03-22 ENCOUNTER — Telehealth (INDEPENDENT_AMBULATORY_CARE_PROVIDER_SITE_OTHER): Payer: Self-pay | Admitting: *Deleted

## 2010-04-07 ENCOUNTER — Ambulatory Visit: Payer: Self-pay | Admitting: Internal Medicine

## 2010-04-07 ENCOUNTER — Ambulatory Visit: Payer: Self-pay | Admitting: Gastroenterology

## 2010-04-07 LAB — CONVERTED CEMR LAB: POC INR: 1.7

## 2010-04-08 LAB — CONVERTED CEMR LAB
Basophils Absolute: 0 10*3/uL (ref 0.0–0.1)
Basophils Relative: 0.4 % (ref 0.0–3.0)
Eosinophils Relative: 1.6 % (ref 0.0–5.0)
HCT: 40.2 % (ref 36.0–46.0)
Hemoglobin: 13.5 g/dL (ref 12.0–15.0)
Lymphocytes Relative: 18.6 % (ref 12.0–46.0)
Lymphs Abs: 1.1 10*3/uL (ref 0.7–4.0)
Monocytes Relative: 7.7 % (ref 3.0–12.0)
Neutro Abs: 4.4 10*3/uL (ref 1.4–7.7)
RBC: 4.84 M/uL (ref 3.87–5.11)

## 2010-04-16 ENCOUNTER — Telehealth: Payer: Self-pay | Admitting: Cardiovascular Disease

## 2010-04-21 ENCOUNTER — Ambulatory Visit: Payer: Self-pay | Admitting: Cardiovascular Disease

## 2010-04-30 ENCOUNTER — Telehealth: Payer: Self-pay | Admitting: Cardiovascular Disease

## 2010-05-04 ENCOUNTER — Telehealth: Payer: Self-pay | Admitting: Cardiovascular Disease

## 2010-05-05 ENCOUNTER — Encounter: Payer: Self-pay | Admitting: Physician Assistant

## 2010-05-05 ENCOUNTER — Ambulatory Visit: Payer: Self-pay

## 2010-05-05 ENCOUNTER — Encounter: Payer: Self-pay | Admitting: Internal Medicine

## 2010-05-05 DIAGNOSIS — I2510 Atherosclerotic heart disease of native coronary artery without angina pectoris: Secondary | ICD-10-CM

## 2010-05-10 ENCOUNTER — Encounter: Payer: Self-pay | Admitting: Gastroenterology

## 2010-05-11 ENCOUNTER — Telehealth (INDEPENDENT_AMBULATORY_CARE_PROVIDER_SITE_OTHER): Payer: Self-pay | Admitting: *Deleted

## 2010-05-23 IMAGING — CT CT LOW EXTREM EXAM
3 series · 16 of 33 positions shown, 19 images · non-contrast
Comparison: NONE

CLINICAL DATA: Left knee pain. 

CT LEFT KNEE WITHOUT INTRAVENOUS CONTRAST
TECHNIQUE: Multiple axial images were obtained along with 
sagittal and coronal reformats.

[Series 2: — · axial · 0.33mm/px · z∈[+669,+846]mm · 8 of 71 slices shown, 10 images]
[im 6/71  soft-tissue]
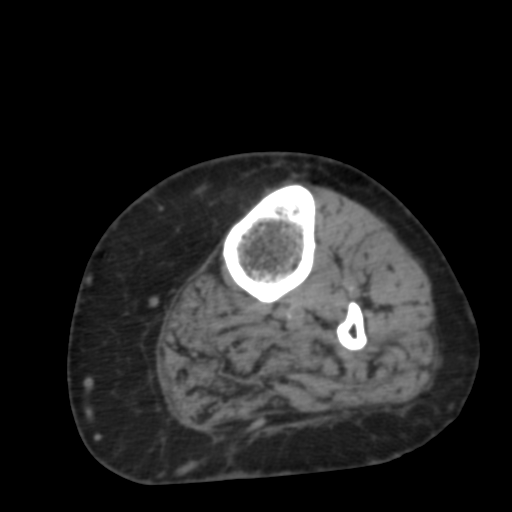
[im 6/71  bone]
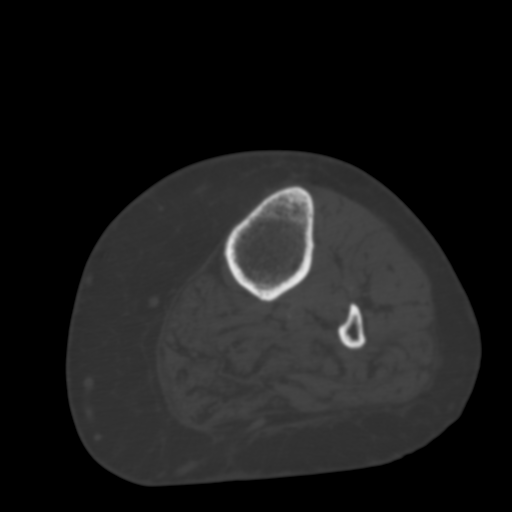
[im 17/71  bone]
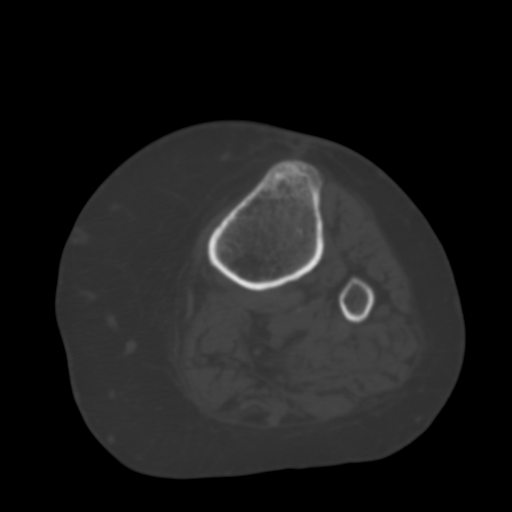
[im 22/71  bone]
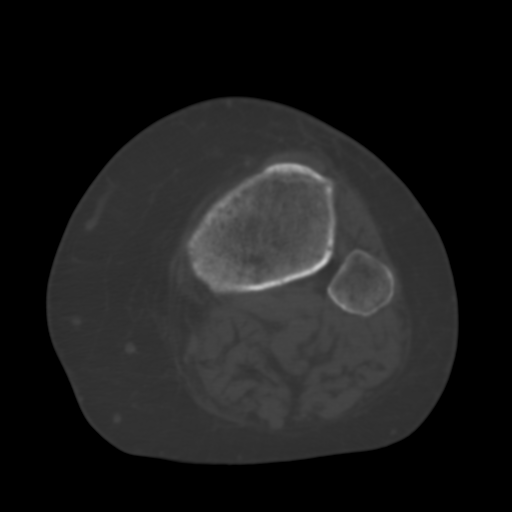
[im 33/71  bone]
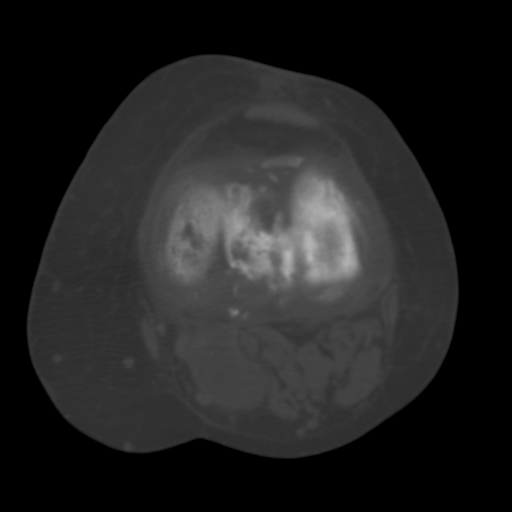
[im 38/71  soft-tissue]
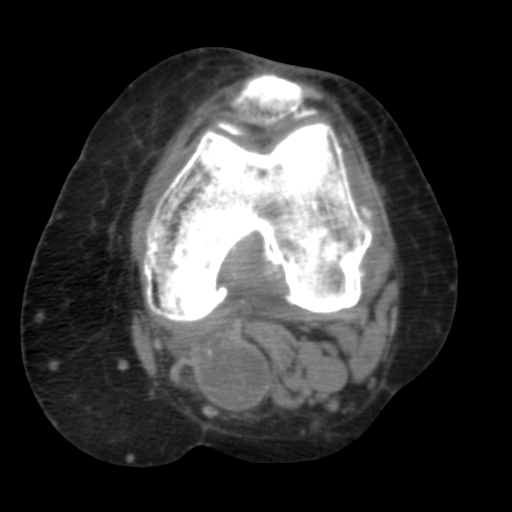
[im 38/71  bone]
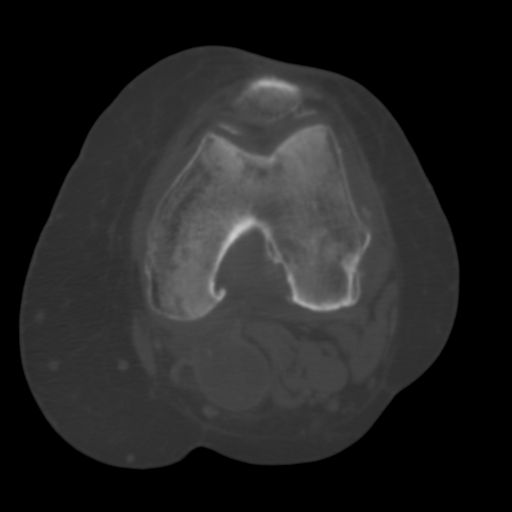
[im 49/71  bone]
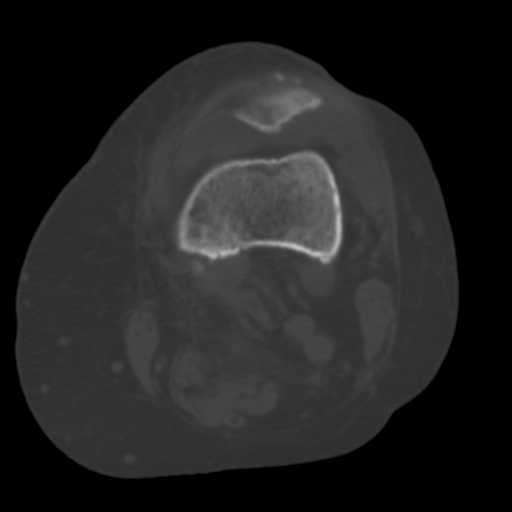
[im 54/71  bone]
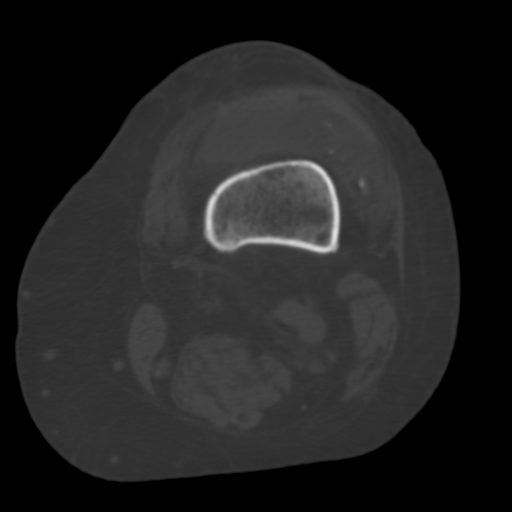
[im 65/71  bone]
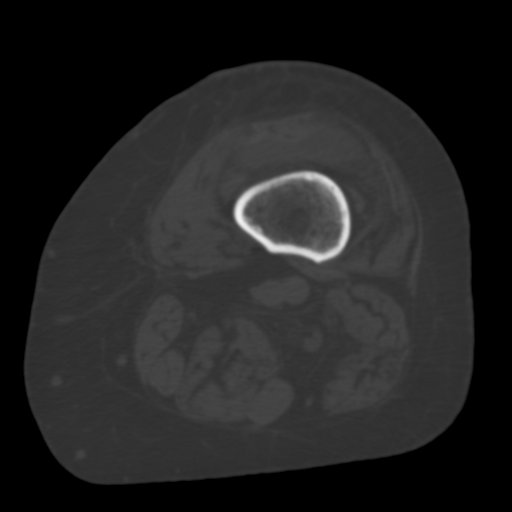

[coronals · coronal · 0.41mm/px · 3 of 32 slices shown]
[im 7/32  bone]
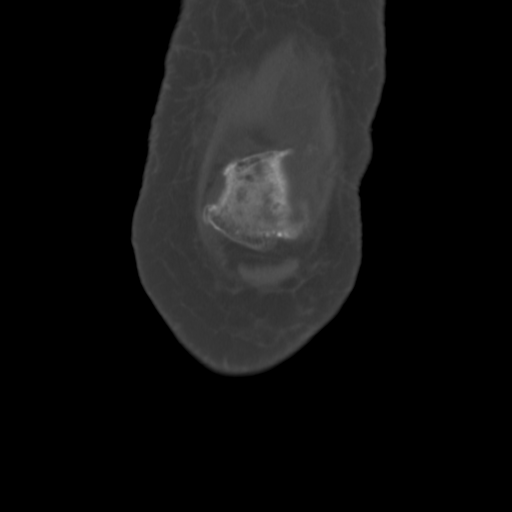
[im 13/32  bone]
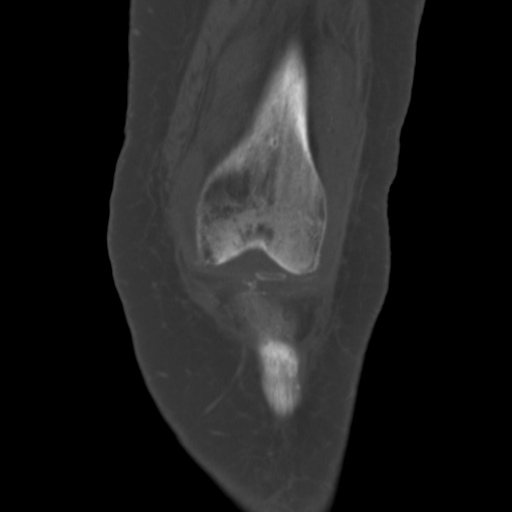
[im 19/32  bone]
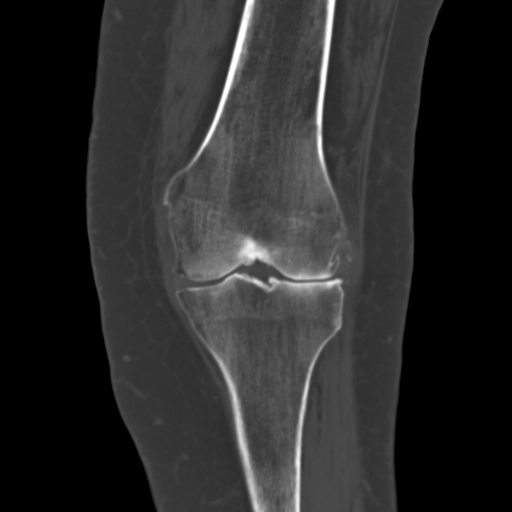

[sagittals · sagittal · 0.41mm/px · 5 of 32 slices shown, 6 images]
[im 11/32  bone]
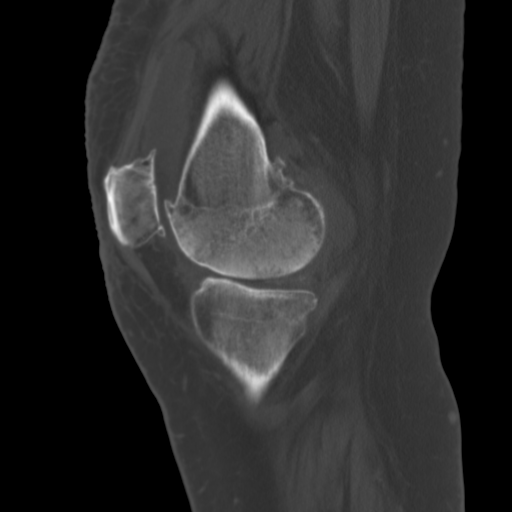
[im 13/32  bone]
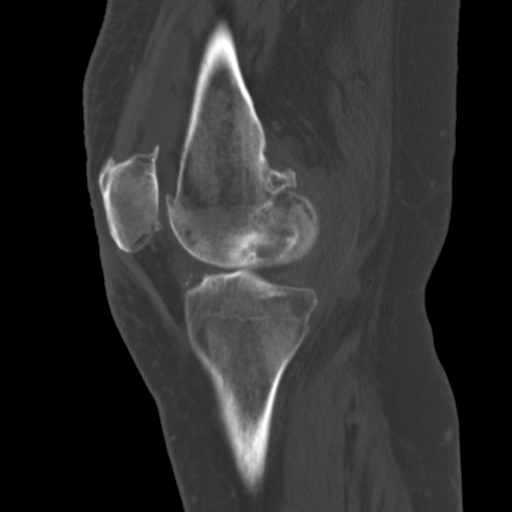
[im 16/32  soft-tissue]
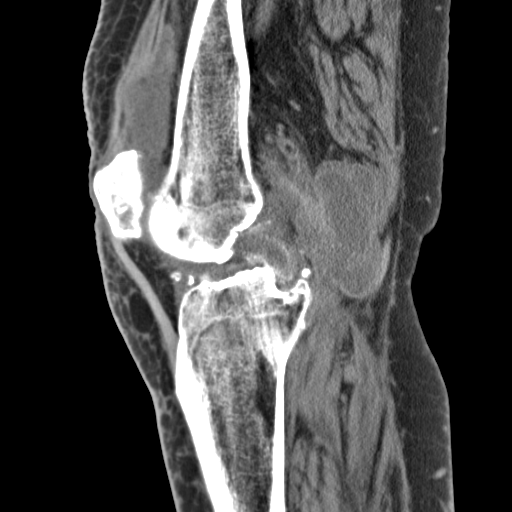
[im 16/32  bone]
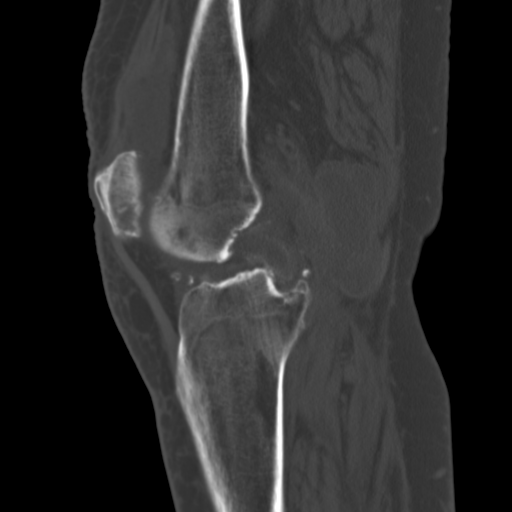
[im 19/32  bone]
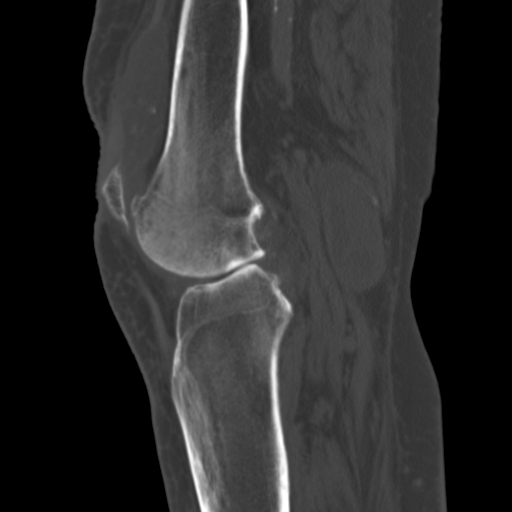
[im 21/32  bone]
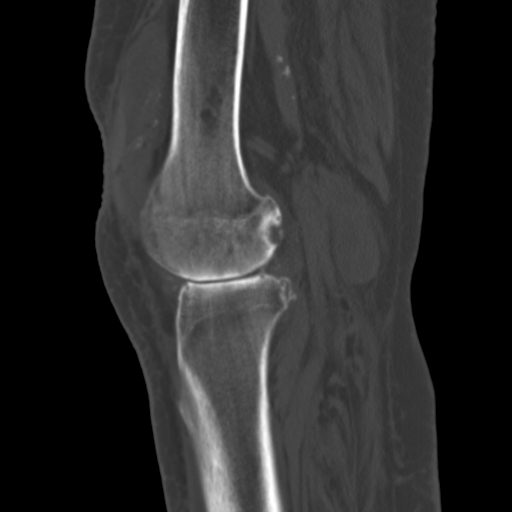

[16 of 33 positions shown; findings below may reference images not displayed]

FINDINGS: Moderate-marked joint space narrowing is noted medially 
and laterally, predominating laterally with mild spurring. No 
evidence of macrofracture. Tiny osseous densities are noted within 
the joint space consistent with loose bodies in the 1-2 mm size 
range. Patellofemoral spurring is present.  No evidence of 
adjacent large soft tissue mass.  There is a joint effusion in the 
suprapatellar bursa.  No large osteochondral defects or erosions 
are identified.
IMPRESSION: Extensive degenerative changes of the left knee.  
Joint effusion.  Tiny joint loose bodies.  No evidence of 
macrofracture. Naraine, Matthew electronically reviewed on 
01/16/2008 Dict Date: 01/16/2008  Tran Date:  01/16/2008 DAS  JLM

## 2010-06-03 ENCOUNTER — Telehealth (INDEPENDENT_AMBULATORY_CARE_PROVIDER_SITE_OTHER): Payer: Self-pay | Admitting: *Deleted

## 2010-06-11 ENCOUNTER — Encounter (INDEPENDENT_AMBULATORY_CARE_PROVIDER_SITE_OTHER): Payer: Self-pay | Admitting: *Deleted

## 2010-06-29 ENCOUNTER — Ambulatory Visit: Payer: Self-pay | Admitting: Internal Medicine

## 2010-07-13 ENCOUNTER — Ambulatory Visit: Payer: Self-pay | Admitting: Cardiovascular Disease

## 2010-07-13 ENCOUNTER — Encounter: Payer: Self-pay | Admitting: Gastroenterology

## 2010-08-10 ENCOUNTER — Encounter: Payer: Self-pay | Admitting: Cardiovascular Disease

## 2010-08-10 ENCOUNTER — Telehealth: Payer: Self-pay | Admitting: Gastroenterology

## 2010-08-18 ENCOUNTER — Ambulatory Visit: Admit: 2010-08-18 | Payer: Self-pay | Admitting: Gastroenterology

## 2010-09-05 ENCOUNTER — Encounter: Payer: Self-pay | Admitting: Family Medicine

## 2010-09-06 ENCOUNTER — Telehealth (INDEPENDENT_AMBULATORY_CARE_PROVIDER_SITE_OTHER): Payer: Self-pay | Admitting: *Deleted

## 2010-09-08 ENCOUNTER — Encounter: Payer: Self-pay | Admitting: Cardiovascular Disease

## 2010-09-14 NOTE — Medication Information (Signed)
Summary: rov/tm  Anticoagulant Therapy  Managed by: Cloyde Reams, RN, BSN Referring MD: Charlton Haws, MD PCP: [name], MD Supervising MD: Ladona Ridgel MD, Sharlot Gowda Indication 1: Atrial Fibrillation Lab Used: [facility] [place] Site: [address] INR POC 4.6 INR RANGE 2-3  Dietary changes: no    Health status changes: no    Bleeding/hemorrhagic complications: no    Recent/future hospitalizations: no    Any changes in medication regimen? no    Recent/future dental: no  Any missed doses?: yes     Details: Missed 1/2 tablet on Mondays.    Is patient compliant with meds? yes      Comments: Pt states she has been taking 1 tablet daily except 1.5 tablets on MWF.    Allergies: 1)  ! * Propylthiouracial 2)  ! * Amiodarone  Anticoagulation Management History:      The patient is taking warfarin and comes in today for a routine follow up visit.  Positive risk factors for bleeding include an age of 74 years or older, history of GI bleeding, and presence of serious comorbidities.  The bleeding index is 'high risk'.  Positive CHADS2 values include History of CHF and History of HTN.  Negative CHADS2 values include Age > 53 years old.  Her last INR was 2.4 ratio.  Anticoagulation responsible provider: Ladona Ridgel MD, Sharlot Gowda.  INR POC: 4.6.  Cuvette Lot#: [id number].  Exp: 12/2010.    Anticoagulation Management Assessment/Plan:      The patient's current anticoagulation dose is Warfarin sodium 2 mg tabs: Use as directed by Anticoagulation Clinic.  The target INR is 2.0-3.0.  The next INR is due 11/11/2009.  Anticoagulation instructions were given to patient.  Results were reviewed/authorized by Cloyde Reams, RN, BSN.  She was notified by Cloyde Reams RN.         Prior Anticoagulation Instructions: INR 3.3 Skip today's dose then change dose to 1 pill everyday except 1.5 pills on Mondays and Fridays. Recheck in 2 weeks.   Current Anticoagulation Instructions: INR 4.6  Skip today  and tomorrow's dosage of coumadin, then start taking 1 tablet daily except 1.5 tablets on Mondays.  Recheck in 10 days.

## 2010-09-14 NOTE — Assessment & Plan Note (Signed)
Summary: device/saf   Visit Type:  Follow-up Referring Provider:  na Primary Provider:  [name], MD   History of Present Illness: [name] returns today for followup.  She is a pleasant 76 yo woman with a h/o HTN, CRI, symptomatic bradycardia and heart block.  She is s/p BiV PM.  She has  occaisional palpitations.  No other complaints. She admits to being sedentary.  She has tried to lose some weight.  Current Medications (verified): 1)  Simvastatin 40 Mg  Tabs (Simvastatin) .... Take 1 By Mouth Qhs 2)  Aspirin 81 Mg  Tbec (Aspirin) .... Take 1 By Mouth Qd 3)  Metoprolol Tartrate 50 Mg Tabs (Metoprolol Tartrate) .... 2 Tabs Two Times A Day 4)  Furosemide 80 Mg Tabs (Furosemide) .... Take One Tablet By Mouth Daily. 5)  Alprazolam 0.25 Mg  Tabs (Alprazolam) .... Take 1 By Mouth Three Times A Day Prn 6)  Warfarin Sodium 2 Mg Tabs (Warfarin Sodium) .... Use As Directed By Anticoagulation Clinic 7)  Protonix 40 Mg Tbec (Pantoprazole Sodium) .... Take 1 Tablet Daily 8)  Fluoxetine Hcl 20 Mg Caps (Fluoxetine Hcl) .Marland Kitchen.. 1 Tab By Mouth Once Daily 9)  Calcitriol 0.25 Mcg Caps (Calcitriol) .Marland Kitchen.. 1 Tab By Mouth Once Daily 10)  Chlordiazepoxide-Amitriptyline 5-12.5 Mg Tabs (Chlordiazepoxide-Amitriptyline) .Marland Kitchen.. 1 Tab By Mouth Once Daily 11)  Klor-Con 10 10 Meq Cr-Tabs (Potassium Chloride) .Marland Kitchen.. 1 Tab Two Times A Day 12)  Cyanocobalamin 1000 Mcg/ml Soln (Cyanocobalamin) .... Inject One Ml Everymonth. 13)  Imodium A-D 2 Mg Tabs (Loperamide Hcl) .... One Tablet By Mouth Two Times A Day As Needed 14)  Mag-Ox 400 400 Mg Tabs (Magnesium Oxide) .... Two Times A Day 15)  Percocet 5-325 Mg Tabs (Oxycodone-Acetaminophen) .... 1/2  Tab Four Times A Day  Allergies: 1)  ! * Propylthiouracial 2)  ! * Amiodarone  Past History:  Past Medical History: Last updated: 12/10/2009 PACEMAKER (ICD-V45.Marland Kitchen01) BRADYCARDIA (ICD-427.89) ANEMIA (ICD-285.9) HYPERPLASTIC GASTRIC POLYP 4/11 CELLULITIS  (ICD-682.9) CAD, UNSPECIFIED SITE (ICD-414.00) EDEMA (ICD-782.3) HYPERTHYROIDISM (ICD-242.90) RENAL INSUFFICIENCY (ICD-588.9) HYPERTENSION (ICD-401.9) GASTROINTESTINAL HEMORRHAGE, HX OF (ICD-V12.79)-GASTRIC ULCER 2008 GERD (ICD-530.81) DEPRESSION (ICD-311) CONGESTIVE HEART FAILURE (ICD-428.0) ATRIAL FIBRILLATION (ICD-427.31) ANTICOAGULATION THERAPY (ICD-V58.61)   Past Surgical History: Last updated: 12/10/2009 Permanent pacemaker EGD (04/06/2007)/COLON 2/11/EGD 4/11 Percutaneous transluminal coronary angioplasty Stress Myoview 03/04/2005) Transthoracic Echocardiogram (02/27/2007)   Review of Systems  The patient denies chest pain, syncope, dyspnea on exertion, and peripheral edema.    Vital Signs:  Patient profile:   77 year old female Height:      67 inches Weight:      184 pounds BMI:     28.92 Pulse rate:   63 / minute BP sitting:   154 / 92  (left arm)  Vitals Entered By: Laurance Flatten CMA (June 29, 2010 12:36 PM)  Physical Exam  General:   Well-nournished, in no acute distress. Neck: No JVD, HJR, Bruit, or thyroid enlargement Lungs: No tachypnea, clear without wheezing, rales, or rhonchi Cardiovascular: RRR, PMI not displaced, heart sounds normal, no murmurs, gallops, bruit, thrill, or heave. Abdomen: BS normal. Soft without organomegaly, masses, lesions or tenderness. Extremities: without cyanosis, clubbing or edema. Good distal pulses bilateral SKin: Warm, no lesions or rashes  Musculoskeletal: No deformities Neuro: no focal signs    PPM Specifications Following MD:  Alexandria Bunting, MD     PPM Vendor:  [employer]     PPM Model Number:  [id number]     PPM Serial Number:  [id number] PPM DOI:  07/05/2007      Lead 1    Location: atrial     DOI: 02/09/2007     Model #: 5076     Serial #: [id number]     Status: active Lead 2    Location: RV     DOI: 02/09/2007     Model #: [id number]     Serial #: [id number]     Status: active Lead 3    Location: LV     DOI: 07/05/2007      Model #: [id number]     Serial #: [id number]     Status: active   Indications:  CHF,tachy-brady syndrome   PPM Follow Up Battery Voltage:  3.003 V     Battery Est. Longevity:  6.5 yrs     Pacer Dependent:  Yes     Right Ventricle  Amplitude: PACED mV, Impedance: 544 ohms, Threshold: 1.00 V at 0.40 msec Left Ventricle  Impedance: 633 ohms, Threshold: 1.00 V at 0.40 msec  Episodes MS Episodes:  0     Coumadin:  No Ventricular High Rate:  0     Ventricular Pacing:  95.4%  Parameters Mode:  VVIR     Lower Rate Limit:  60     Upper Rate Limit:  130 Next Cardiology Appt Due:  12/14/2010 Tech Comments:  NORMAL DEVICE FUNCTION.  NO EPISODES SINCE LAST CHECK.  NO CHANGES MADE. ROV IN 6 MTHS W/DEVICE CLINIC. [name]  June 29, 2010 12:41 PM MD Comments:  Agree with above.  Impression & Recommendations:  Problem # 1:  PACEMAKER, PERMANENT (ICD-V45.01) Her device is working normally.  Will recheck in several months.  Problem # 2:  HYPERTENSION (ICD-401.9) Her blood pressure has been well controlled. A low sodium diet is requested. Her updated medication list for this problem includes:    Aspirin 81 Mg Tbec (Aspirin) .Marland Kitchen... Take 1 by mouth qd    Metoprolol Tartrate 50 Mg Tabs (Metoprolol tartrate) .Marland Kitchen... 2 tabs two times a day    Furosemide 80 Mg Tabs (Furosemide) .Marland Kitchen... Take one tablet by mouth daily.  Problem # 3:  CHF (ICD-428.0) Her symptoms are class 2.  Continue meds as below. A low sodium diet is requested. Her updated medication list for this problem includes:    Aspirin 81 Mg Tbec (Aspirin) .Marland Kitchen... Take 1 by mouth qd    Metoprolol Tartrate 50 Mg Tabs (Metoprolol tartrate) .Marland Kitchen... 2 tabs two times a day    Furosemide 80 Mg Tabs (Furosemide) .Marland Kitchen... Take one tablet by mouth daily.    Warfarin Sodium 2 Mg Tabs (Warfarin sodium) ..... Use as directed by anticoagulation clinic  Patient Instructions: 1)  Your physician wants you to follow-up in:  6 months in the device clinic and 12  months with Dr Court Joy will receive a reminder letter in the mail two months in advance. If you don't receive a letter, please call our office to schedule the follow-up appointment.

## 2010-09-14 NOTE — Assessment & Plan Note (Signed)
Summary: F6M/DM   Referring Provider:  na Primary Provider:  [name], MD  CC:  check up.  History of Present Illness: [name] returns today for followup.  She is a pleasant 76 yo woman with a h/o HTN, CRI, symptomatic bradycardia and heart block.  She is s/p BiV PM.  She has  occaisional palpitations.  No other complaints. She admits to being sedentary.  She has tried to lose some weight.  One of her sons is living off her and causes a lot of distress in her life.  He is unemployed and [name].  I spoke with her other son [name] about intervening in this situation and he will try to be supportive  Current Problems (verified): 1)  CHF  (ICD-428.0) 2)  Cad, Native Vessel  (ICD-414.01) 3)  Blood in Stool-melena  (ICD-578.1) 4)  Congestive Heart Failure  (ICD-428.0) 5)  Diarrhea  (ICD-787.91) 6)  Nonspecific Abnormal Finding in Stool Contents  (ICD-792.1) 7)  Preoperative Examination  (ICD-V72.84) 8)  Pacemaker, Permanent  (ICD-V45.01) 9)  Bradycardia  (ICD-427.89) 10)  Anemia  (ICD-285.9) 11)  Cellulitis  (ICD-682.9) 12)  Cad, Unspecified Site  (ICD-414.00) 13)  Edema  (ICD-782.3) 14)  Hyperthyroidism  (ICD-242.90) 15)  Renal Insufficiency  (ICD-588.9) 16)  Hypertension  (ICD-401.9) 17)  Gastrointestinal Hemorrhage, Hx of  (ICD-V12.79) 18)  Gerd  (ICD-530.81) 19)  Depression  (ICD-311) 20)  Congestive Heart Failure  (ICD-428.0) 21)  Atrial Fibrillation  (ICD-427.31) 22)  Anticoagulation Therapy  (ICD-V58.61)  Current Medications (verified): 1)  Simvastatin 40 Mg  Tabs (Simvastatin) .... Take 1 By Mouth Qhs 2)  Aspirin 81 Mg  Tbec (Aspirin) .... Take 1 By Mouth Qd 3)  Metoprolol Tartrate 50 Mg Tabs (Metoprolol Tartrate) .... 2 Tabs Two Times A Day 4)  Furosemide 80 Mg Tabs (Furosemide) .... Take One Tablet By Mouth Daily. 5)  Alprazolam 0.25 Mg  Tabs (Alprazolam) .... Take 1 By Mouth Three Times A Day Prn 6)  Warfarin Sodium 2 Mg Tabs (Warfarin Sodium) .... Use  As Directed By Anticoagulation Clinic 7)  Protonix 40 Mg Tbec (Pantoprazole Sodium) .... Take 1 Tablet Daily 8)  Fluoxetine Hcl 20 Mg Caps (Fluoxetine Hcl) .Marland Kitchen.. 1 Tab By Mouth Once Daily 9)  Calcitriol 0.25 Mcg Caps (Calcitriol) .Marland Kitchen.. 1 Tab By Mouth Once Daily 10)  Chlordiazepoxide-Amitriptyline 5-12.5 Mg Tabs (Chlordiazepoxide-Amitriptyline) .Marland Kitchen.. 1 Tab By Mouth Once Daily 11)  Klor-Con 10 10 Meq Cr-Tabs (Potassium Chloride) .Marland Kitchen.. 1 Tab Two Times A Day 12)  Cyanocobalamin 1000 Mcg/ml Soln (Cyanocobalamin) .... Inject One Ml Everymonth. 13)  Imodium A-D 2 Mg Tabs (Loperamide Hcl) .... One Tablet By Mouth Two Times A Day As Needed 14)  Mag-Ox 400 400 Mg Tabs (Magnesium Oxide) .... Two Times A Day 15)  Percocet 5-325 Mg Tabs (Oxycodone-Acetaminophen) .... 1/2  Tab Four Times A Day 16)  Ferrous Gluconate 324 (38 Fe) Mg Tabs (Ferrous Gluconate) .... One Tablet By Mouth Once Daily 17)  Vitamin B-12 1000 Mcg Subl (Cyanocobalamin) .Marland Kitchen.. 1cc Im Once Monthly 18)  Sucralfate 1 Gm Tabs (Sucralfate) .... One Tablet By Mouth Three Times A Day  Allergies (verified): 1)  ! * Propylthiouracial 2)  ! * Amiodarone  Past History:  Past Medical History: Last updated: 12/10/2009 PACEMAKER (ICD-V45.Marland Kitchen01) BRADYCARDIA (ICD-427.89) ANEMIA (ICD-285.9) HYPERPLASTIC GASTRIC POLYP 4/11 CELLULITIS (ICD-682.9) CAD, UNSPECIFIED SITE (ICD-414.00) EDEMA (ICD-782.3) HYPERTHYROIDISM (ICD-242.90) RENAL INSUFFICIENCY (ICD-588.9) HYPERTENSION (ICD-401.9) GASTROINTESTINAL HEMORRHAGE, HX OF (ICD-V12.79)-GASTRIC ULCER 2008 GERD (ICD-530.81) DEPRESSION (ICD-311) CONGESTIVE HEART FAILURE (ICD-428.0)  ATRIAL FIBRILLATION (ICD-427.31) ANTICOAGULATION THERAPY (ICD-V58.61)   Past Surgical History: Last updated: 12/10/2009 Permanent pacemaker EGD (04/06/2007)/COLON 2/11/EGD 4/11 Percutaneous transluminal coronary angioplasty Stress Myoview 03/04/2005) Transthoracic Echocardiogram (02/27/2007)   Family History: Last updated:  12/10/2009 no thyroid disease. No FH of Colon Cancer:  Social History: Last updated: 01/19/2009 patient is widowed and retired.  She is here today with her son. Tobacco Use - No.  Alcohol Use - no Activity limited by knee pain  Review of Systems       Denies fever, malais, weight loss, blurry vision, decreased visual acuity, cough, sputum, SOB, hemoptysis, pleuritic pain, palpitaitons, heartburn, abdominal pain, melena, lower extremity edema, claudication, or rash.   Vital Signs:  Patient profile:   76 year old female Height:      67 inches Weight:      182 pounds BMI:     28.61 Pulse rate:   68 / minute Resp:     14 per minute BP sitting:   140 / 84  (left arm)  Vitals Entered By: Kem Parkinson (July 13, 2010 11:27 AM)  Physical Exam  General:  Affect appropriate Healthy:  appears stated age HEENT: normal Neck supple with no adenopathy JVP normal no bruits no thyromegaly Lungs clear with no wheezing and good diaphragmatic motion Heart:  S1/S2 systolic murmur no ,rub, gallop or click PMI normal Abdomen: benighn, BS positve, no tenderness, no AAA no bruit.  No HSM or HJR Distal pulses intact with no bruits No edema Neuro non-focal Skin warm and dry    PPM Specifications Following MD:  Lewayne Bunting, MD     PPM Vendor:  [employer]     PPM Model Number:  [id number]     PPM Serial Number:  [id number] PPM DOI:  07/05/2007      Lead 1    Location: atrial     DOI: 02/09/2007     Model #: 5076     Serial #: [id number]     Status: active Lead 2    Location: RV     DOI: 02/09/2007     Model #: 6213     Serial #: [id number]     Status: active Lead 3    Location: LV     DOI: 07/05/2007     Model #: 2952     Serial #: [id number]     Status: active   Indications:  CHF,tachy-brady syndrome   PPM Follow Up Pacer Dependent:  Yes      Episodes Coumadin:  No  Parameters Mode:  VVIR     Lower Rate Limit:  60     Upper Rate Limit:  130  Impression &  Recommendations:  Problem # 1:  CAD, NATIVE VESSEL (ICD-414.01) Stable no angina Her updated medication list for this problem includes:    Aspirin 81 Mg Tbec (Aspirin) .Marland Kitchen... Take 1 by mouth qd    Metoprolol Tartrate 50 Mg Tabs (Metoprolol tartrate) .Marland Kitchen... 2 tabs two times a day    Warfarin Sodium 2 Mg Tabs (Warfarin sodium) ..... Use as directed by anticoagulation clinic  Problem # 2:  CONGESTIVE HEART FAILURE (ICD-428.0) Euvolemic with no PND or orthopnea  Continue current meds Her updated medication list for this problem includes:    Aspirin 81 Mg Tbec (Aspirin) .Marland Kitchen... Take 1 by mouth qd    Metoprolol Tartrate 50 Mg Tabs (Metoprolol tartrate) .Marland Kitchen... 2 tabs two times a day    Furosemide 80 Mg Tabs (Furosemide) .Marland Kitchen... Take one tablet by mouth  daily.    Warfarin Sodium 2 Mg Tabs (Warfarin sodium) ..... Use as directed by anticoagulation clinic  Problem # 3:  PACEMAKER, PERMANENT (ICD-V45.01) Normal function with no syncope  Problem # 4:  HYPERTENSION (ICD-401.9) Well controlled Her updated medication list for this problem includes:    Aspirin 81 Mg Tbec (Aspirin) .Marland Kitchen... Take 1 by mouth qd    Metoprolol Tartrate 50 Mg Tabs (Metoprolol tartrate) .Marland Kitchen... 2 tabs two times a day    Furosemide 80 Mg Tabs (Furosemide) .Marland Kitchen... Take one tablet by mouth daily.  Problem # 5:  DEPRESSION (ICD-311) In NSR with pacer back up now.  Continue anticoagulation with coumadin  Patient Instructions: 1)  Your physician wants you to follow-up in: 6 MONTHS  You will receive a reminder letter in the mail two months in advance. If you don't receive a letter, please call our office to schedule the follow-up appointment.

## 2010-09-14 NOTE — Medication Information (Signed)
Summary: ccr  Anticoagulant Therapy  Managed by: Weston Brass, PharmD Referring MD: Charlton Haws, MD PCP: [name], MD Supervising MD: Graciela Husbands MD, Viviann Spare Indication 1: Atrial Fibrillation Lab Used: [facility] [place] Site: [address] INR POC 2.8 INR RANGE 2-3  Dietary changes: no    Health status changes: no    Bleeding/hemorrhagic complications: no    Recent/future hospitalizations: no    Any changes in medication regimen? no    Recent/future dental: no  Any missed doses?: no       Is patient compliant with meds? yes       Allergies: 1)  ! * Propylthiouracial 2)  ! * Amiodarone  Anticoagulation Management History:      The patient is taking warfarin and comes in today for a routine follow up visit.  Positive risk factors for bleeding include an age of 74 years or older, history of GI bleeding, and presence of serious comorbidities.  The bleeding index is 'high risk'.  Positive CHADS2 values include History of CHF and History of HTN.  Negative CHADS2 values include Age > 4 years old.  Her last INR was 1.4 ratio.  Anticoagulation responsible provider: Graciela Husbands MD, Viviann Spare.  INR POC: 2.8.  Cuvette Lot#: [id number].  Exp: 05/2011.    Anticoagulation Management Assessment/Plan:      The patient's current anticoagulation dose is Warfarin sodium 2 mg tabs: Use as directed by Anticoagulation Clinic.  The target INR is 2.0-3.0.  The next INR is due 03/30/2010.  Anticoagulation instructions were given to patient.  Results were reviewed/authorized by Weston Brass, PharmD.  She was notified by Dillard Cannon.         Prior Anticoagulation Instructions: INR 4.9  Skip today and tomorrow's dose of Coumadin then resume same dose of 1 tablet every day except 1 1/2 tablets on Monday.   Current Anticoagulation Instructions: INR 2.8  Continue same dose of 1 tab daily except 1.5 tabs on Monday.  Re-check INR in 4 weeks.

## 2010-09-14 NOTE — Progress Notes (Signed)
Summary: Pace maker beating hard  Phone Note Call from Patient Call back at [employer] [phone]   Caller: Patient Summary of Call: Pt pace maker beating hard agian Initial call taken by: Judie Grieve,  June 03, 2010 12:13 PM  Follow-up for Phone Call        C/O intermittent stimulation when she lays on her left side, states"It had quit for a while but is happening again."  We will check her at her scheduled appt. with Dr. Ladona Ridgel in November.   Follow-up by: Altha Harm, LPN,  June 03, 2010 5:25 PM

## 2010-09-14 NOTE — Procedures (Signed)
Summary: Colonoscopy  Patient: [name] Note: All result statuses are Final unless otherwise noted.  Tests: (1) Colonoscopy (COL)   COL Colonoscopy           DONE     [facility]     [address]     [place], [place]  [address]           COLONOSCOPY PROCEDURE REPORT           PATIENT:  [name], [name]  MR#:  [id number]     BIRTHDATE:  11-Feb-1935, 74 yrs. old  GENDER:  female           ENDOSCOPIST:  Rachael Fee, MD     Referred by:  Irena Cords, M.D.           PROCEDURE DATE:  08/31/2009     PROCEDURE:  Colonoscopy, Diagnostic     ASA CLASS:  Class II     INDICATIONS:  dark stools, anemia (recent EGDs)           MEDICATIONS:   Fentanyl 75 mcg IV, Versed 7 mg IV           DESCRIPTION OF PROCEDURE:   After the risks benefits and     alternatives of the procedure were thoroughly explained, informed     consent was obtained.  Digital rectal exam was performed and     revealed no rectal masses.   The LB [id number] endoscope     was introduced through the anus and advanced to the cecum, which     was identified by both the appendix and ileocecal valve, without     limitations.  The quality of the prep was adequate, using     MoviPrep.  The instrument was then slowly withdrawn as the colon     was fully examined.     <<PROCEDUREIMAGES>>           FINDINGS:  External hemorrhoids were found. These were small and     not thrombosed.  This was otherwise a normal examination of the     colon (see image1, image2, and image3).   Retroflexed views in the     rectum revealed no abnormalities.    The scope was then withdrawn     from the patient and the procedure completed.           COMPLICATIONS:  None           ENDOSCOPIC IMPRESSION:     1) External hemorrhoids     2) Otherwise normal examination; no polyps or cancers     RECOMMENDATIONS:     1) Continue current colorectal screening recommendations for     "routine risk" patients with a repeat  colonoscopy in 10 years.           REPEAT EXAM:  10 years           ______________________________     Rachael Fee, MD           cc: [name], MD           n.     Rosalie Doctor:   Rachael Fee at 08/31/2009 11:06 AM           Pearletha Forge, [phone]  Note: An exclamation mark (!) indicates a result that was not dispersed into the flowsheet. Document Creation Date: 08/31/2009 11:06 AM _______________________________________________________________________  (1) Order result status: Final Collection or observation date-time: 08/31/2009 11:01 Requested date-time:  Receipt date-time:  Reported date-time:  Referring Physician:   Ordering Physician: Rob Bunting [phone]) Specimen Source:  Source: Launa Grill Order Number: [id number] Lab site:   Appended Document: Colonoscopy    Clinical Lists Changes  Observations: Added new observation of COLONNXTDUE: 08/2019 (08/31/2009 15:21)

## 2010-09-14 NOTE — Letter (Signed)
Summary: [facility] Office Note  [facility] Office Note   Imported By: Roderic Ovens 12/17/2009 11:48:23  _____________________________________________________________________  External Attachment:    Type:   Image     Comment:   External Document

## 2010-09-14 NOTE — Progress Notes (Signed)
Summary: Triage  Phone Note Call from Patient   Caller: son [name]  [phone] Call For: Dr. Christella Hartigan Reason for Call: Talk to Nurse Summary of Call: Pt is having diarrhea and her stool is dark. Wants to know if something can be prescribed.  Initial call taken by: Karna Christmas,  November 12, 2009 3:02 PM  Follow-up for Phone Call        pt will ues immodium for the diarrhea and scheduled appt for 4/12.  I explained that she must keep this appt she has no showed the last two that have been scheduled.  She will call if the immodium does not work and will stop it if she becomes constipated.  Follow-up by: Chales Abrahams CMA Duncan Dull),  November 12, 2009 3:10 PM  Additional Follow-up for Phone Call Additional follow up Details #1::        i agree Additional Follow-up by: Rachael Fee MD,  November 12, 2009 8:16 PM

## 2010-09-14 NOTE — Letter (Signed)
Summary: [facility]  [facility]   Imported By: Sherian Rein 05/18/2010 09:16:23  _____________________________________________________________________  External Attachment:    Type:   Image     Comment:   External Document

## 2010-09-14 NOTE — Medication Information (Signed)
Summary: rov/cb  Anticoagulant Therapy  Managed by: Weston Brass, PharmD Referring MD: Charlton Haws, MD PCP: [name], MD Supervising MD: Riley Kill MD, Maisie Fus Indication 1: Atrial Fibrillation Lab Used: [facility] [place] Site: [address] INR POC 4.9 INR RANGE 2-3  Dietary changes: yes       Details: less vitamin k intake   Health status changes: yes       Details: having diarrhea  Bleeding/hemorrhagic complications: no    Recent/future hospitalizations: no    Any changes in medication regimen? yes       Details: on cipro 500mg  bid for diarrhea.  Has 1 day left   Recent/future dental: no  Any missed doses?: no       Is patient compliant with meds? yes       Allergies: 1)  ! * Propylthiouracial 2)  ! * Amiodarone  Anticoagulation Management History:      The patient is taking warfarin and comes in today for a routine follow up visit.  Positive risk factors for bleeding include an age of 74 years or older, history of GI bleeding, and presence of serious comorbidities.  The bleeding index is 'high risk'.  Positive CHADS2 values include History of CHF and History of HTN.  Negative CHADS2 values include Age > 74 years old.  Her last INR was 1.4 ratio.  Anticoagulation responsible provider: Riley Kill MD, Maisie Fus.  INR POC: 4.9.  Cuvette Lot#: [id number].  Exp: 04/2011.    Anticoagulation Management Assessment/Plan:      The patient's current anticoagulation dose is Warfarin sodium 2 mg tabs: Use as directed by Anticoagulation Clinic.  The target INR is 2.0-3.0.  The next INR is due 02/24/2010.  Anticoagulation instructions were given to patient.  Results were reviewed/authorized by Weston Brass, PharmD.  She was notified by Weston Brass PharmD.         Prior Anticoagulation Instructions: INR 2.5. Take 1 tablet daily except 1.5 tablets on Monday.  Notify Coumadin Clinic of any procedures.  Recheck in 4 weeks.  Current Anticoagulation Instructions: INR 4.9  Skip today  and tomorrow's dose of Coumadin then resume same dose of 1 tablet every day except 1 1/2 tablets on Monday.

## 2010-09-14 NOTE — Progress Notes (Signed)
  Phone Note Other Incoming   Request: Send information Summary of Call: Records received from [facility]. 2 pages fowarded to Dr. Christella Hartigan for review.

## 2010-09-14 NOTE — Medication Information (Signed)
Summary: rov/tm  Anticoagulant Therapy  Managed by: Cloyde Reams, RN, BSN Referring MD: Charlton Haws, MD PCP: [name], MD Supervising MD: Clifton James MD, Cristal Deer Indication 1: Atrial Fibrillation Lab Used: [facility] [place] Site: [address] INR POC 2.0 INR RANGE 2-3  Dietary changes: no    Health status changes: no    Bleeding/hemorrhagic complications: no    Recent/future hospitalizations: yes       Details: Pt scheduled for colonoscopy on 08/26/09, will be stopping coumadin 5 days prior to procedure.    Any changes in medication regimen? yes       Details: Starting on Vit B12 shots once monthly.  Recent/future dental: no  Any missed doses?: no       Is patient compliant with meds? yes      Comments: DFr [name] has OK'd pt to come off coumadin 4-5 days prior to colonoscopy without lovenox.    Current Medications (verified): 1)  Klor-Con M20 20 Meq  Tbcr (Potassium Chloride Crys Cr) .... Take 1 By Mouth Two Times A Day Qd 2)  Simvastatin 40 Mg  Tabs (Simvastatin) .... Take 1 By Mouth Qhs 3)  Aspirin 81 Mg  Tbec (Aspirin) .... Take 1 By Mouth Qd 4)  Metoprolol Tartrate 50 Mg Tabs (Metoprolol Tartrate) .... Take One Tablet By Mouth Twice A Day 5)  Furosemide 80 Mg Tabs (Furosemide) .... Take One Tablet By Mouth Daily. 6)  Alprazolam 0.25 Mg  Tabs (Alprazolam) .... Take 1 By Mouth Three Times A Day Prn 7)  Ferrous Sulfate 325 (65 Fe) Mg  Tabs (Ferrous Sulfate) .Marland Kitchen.. 1 Tab By Mouth Once Daily 8)  Warfarin Sodium 2 Mg Tabs (Warfarin Sodium) .... Use As Directed By Anticoagulation Clinic 9)  Protonix 40 Mg Tbec (Pantoprazole Sodium) .... Take 1 Tablet Daily 10)  Fluoxetine Hcl 20 Mg Caps (Fluoxetine Hcl) .Marland Kitchen.. 1 Tab By Mouth Once Daily 11)  Calcitriol 0.25 Mcg Caps (Calcitriol) .Marland Kitchen.. 1 Tab By Mouth Once Daily 12)  Chlordiazepoxide-Amitriptyline 5-12.5 Mg Tabs (Chlordiazepoxide-Amitriptyline) .Marland Kitchen.. 1 Tab By Mouth Once Daily 13)  Moviprep 100 Gm  Solr  (Peg-Kcl-Nacl-Nasulf-Na Asc-C) .... As Per Prep Instructions. 14)  Klor-Con 10 10 Meq Cr-Tabs (Potassium Chloride) .... Take 1 Tablet By Mouth Once A Day in Addition To Two Times A Day.  Allergies (verified): 1)  ! * Propylthiouracial 2)  ! * Amiodarone  Anticoagulation Management History:      The patient is taking warfarin and comes in today for a routine follow up visit.  Positive risk factors for bleeding include an age of 74 years or older, history of GI bleeding, and presence of serious comorbidities.  The bleeding index is 'high risk'.  Positive CHADS2 values include History of CHF and History of HTN.  Negative CHADS2 values include Age > 74 years old.  Her last INR was 2.4 ratio.  Anticoagulation responsible provider: Clifton James MD, Cristal Deer.  INR POC: 2.0.  Cuvette Lot#: [id number].  Exp: 09/2010.    Anticoagulation Management Assessment/Plan:      The patient's current anticoagulation dose is Warfarin sodium 2 mg tabs: Use as directed by Anticoagulation Clinic.  The target INR is 2.0-3.0.  The next INR is due 74/19/2011.  Anticoagulation instructions were given to patient.  Results were reviewed/authorized by Cloyde Reams, RN, BSN.  She was notified by Cloyde Reams RN.         Prior Anticoagulation Instructions: INR 2.2 Continue taking 2mg s everyday except 3mg s on Mondays, Wednesdays and Fridays.  Recheck in 4 weeks.   Current Anticoagulation Instructions: INR 2.0  Continue on same dosage of coumadin 1 tablet daily except 1.5 tablets on Mondays, Wednesdays, and Fridays until off for colonoscopy.  Will Recheck coumadin 1 week after colonoscopy.

## 2010-09-14 NOTE — Progress Notes (Signed)
Summary: triage  Phone Note Call from Patient Call back at [phone]   Caller: Patient Call For: Dr. Christella Hartigan Reason for Call: Talk to Nurse Summary of Call: black stools since EGD and diarrhea pt wants Path results Initial call taken by: Vallarie Mare,  November 30, 2009 3:39 PM  Follow-up for Phone Call        Path results not available pt will continue to take immodium as needed everyday.   Follow-up by: Chales Abrahams CMA Duncan Dull),  November 30, 2009 3:51 PM

## 2010-09-14 NOTE — Assessment & Plan Note (Signed)
Summary: f57m/dm   Referring Provider:  na Primary Provider:  [name], MD  CC:  checj up.  History of Present Illness: [name] is seen today for F/U of HTN, coumadin and afib.  She has had a horrible time with diarhea since her colonoscopy and is seeing Dr Christella Hartigan in F/U soon.  She also indicates that she is having difficulty swallowing.  She appears to have had an upper endoscopy in February and there was no mention of esophageal stricture but she may have a motility problem  Her heart is fine.  She denies SOB, palpitaitons, edema, SSCP or syncope Her stools have not been bloody.  She is compliant with her coumadin but it will need to be held soon for F/U colonoscopy.  Her pacer is working fine and she will see Dr Ladona Ridgel in 6 months.  Current Problems (verified): 1)  Blood in Stool-melena  (ICD-578.1) 2)  Congestive Heart Failure  (ICD-428.00) 3)  Diarrhea  (ICD-787.91) 4)  Nonspecific Abnormal Finding in Stool Contents  (ICD-792.1) 5)  Preoperative Examination  (ICD-V72.84) 6)  Pacemaker  (ICD-V45.Marland Kitchen01) 7)  Bradycardia  (ICD-427.89) 8)  Anemia  (ICD-285.9) 9)  Cellulitis  (ICD-682.9) 10)  Cad, Unspecified Site  (ICD-414.00) 11)  Edema  (ICD-782.3) 12)  Hyperthyroidism  (ICD-242.90) 13)  Renal Insufficiency  (ICD-588.9) 14)  Hypertension  (ICD-401.9) 15)  Gastrointestinal Hemorrhage, Hx of  (ICD-V12.79) 16)  Gerd  (ICD-530.81) 17)  Depression  (ICD-311) 18)  Congestive Heart Failure  (ICD-428.0) 19)  Atrial Fibrillation  (ICD-427.31) 20)  Anticoagulation Therapy  (ICD-V58.61)  Current Medications (verified): 1)  Simvastatin 40 Mg  Tabs (Simvastatin) .... Take 1 By Mouth Qhs 2)  Aspirin 81 Mg  Tbec (Aspirin) .... Take 1 By Mouth Qd 3)  Metoprolol Tartrate 50 Mg Tabs (Metoprolol Tartrate) .... Take One Tablet By Mouth Twice A Day 4)  Furosemide 80 Mg Tabs (Furosemide) .... Take One Tablet By Mouth Daily. 5)  Alprazolam 0.25 Mg  Tabs (Alprazolam) .... Take 1 By Mouth Three Times A  Day Prn 6)  Warfarin Sodium 2 Mg Tabs (Warfarin Sodium) .... Use As Directed By Anticoagulation Clinic 7)  Protonix 40 Mg Tbec (Pantoprazole Sodium) .... Take 1 Tablet Daily 8)  Fluoxetine Hcl 20 Mg Caps (Fluoxetine Hcl) .Marland Kitchen.. 1 Tab By Mouth Once Daily 9)  Calcitriol 0.25 Mcg Caps (Calcitriol) .Marland Kitchen.. 1 Tab By Mouth Once Daily 10)  Chlordiazepoxide-Amitriptyline 5-12.5 Mg Tabs (Chlordiazepoxide-Amitriptyline) .Marland Kitchen.. 1 Tab By Mouth Once Daily 11)  Klor-Con 10 10 Meq Cr-Tabs (Potassium Chloride) .... Take 1 Tablet By Mouth Once A Day in Addition To Two Times A Day. 12)  Cyanocobalamin 1000 Mcg/ml Soln (Cyanocobalamin) .... Inject One Ml Everymonth. 13)  Imodium A-D 2 Mg Tabs (Loperamide Hcl) .... One Tablet By Mouth Two Times A Day As Needed  Allergies (verified): 1)  ! * Propylthiouracial 2)  ! * Amiodarone  Past History:  Past Medical History: Last updated: 12/10/2009 PACEMAKER (ICD-V45.Marland Kitchen01) BRADYCARDIA (ICD-427.89) ANEMIA (ICD-285.9) HYPERPLASTIC GASTRIC POLYP 4/11 CELLULITIS (ICD-682.9) CAD, UNSPECIFIED SITE (ICD-414.00) EDEMA (ICD-782.3) HYPERTHYROIDISM (ICD-242.90) RENAL INSUFFICIENCY (ICD-588.9) HYPERTENSION (ICD-401.9) GASTROINTESTINAL HEMORRHAGE, HX OF (ICD-V12.79)-GASTRIC ULCER 2008 GERD (ICD-530.81) DEPRESSION (ICD-311) CONGESTIVE HEART FAILURE (ICD-428.0) ATRIAL FIBRILLATION (ICD-427.31) ANTICOAGULATION THERAPY (ICD-V58.61)   Past Surgical History: Last updated: 12/10/2009 Permanent pacemaker EGD (04/06/2007)/COLON 2/11/EGD 4/11 Percutaneous transluminal coronary angioplasty Stress Myoview 03/04/2005) Transthoracic Echocardiogram (02/27/2007)   Family History: Last updated: 12/10/2009 no thyroid disease. No FH of Colon Cancer:  Social History: Last updated: 01/19/2009 patient is widowed and  retired.  She is here today with her son. Tobacco Use - No.  Alcohol Use - no Activity limited by knee pain  Review of Systems       Denies fever, malais, weight  loss, blurry vision, decreased visual acuity, cough, sputum, SOB, hemoptysis, pleuritic pain, palpitaitons, heartburn, abdominal pain, melena, lower extremity edema, claudication, or rash.   Vital Signs:  Patient profile:   76 year old female Height:      67 inches Weight:      189 pounds BMI:     29.71 Pulse rate:   64 / minute Resp:     12 per minute BP sitting:   140 / 82  (left arm)  Vitals Entered By: Kem Parkinson (January 13, 2010 11:20 AM)  Physical Exam  General:  Affect appropriate Healthy:  appears stated age HEENT: normal Neck supple with no adenopathy JVP normal no bruits no thyromegaly Lungs clear with no wheezing and good diaphragmatic motion Heart:  S1/S2 no murmur,rub, gallop or click PMI normal Abdomen: benighn, BS positve, no tenderness, no AAA no bruit.  No HSM or HJR Distal pulses intact with no bruits No edema Neuro non-focal Skin warm and dry    PPM Specifications Following MD:  Lewayne Bunting, MD     PPM Vendor:  [employer]     PPM Model Number:  [id number]     PPM Serial Number:  [id number] PPM DOI:  07/05/2007      Lead 1    Location: atrial     DOI: 02/09/2007     Model #: [id number]     Serial #: [id number]     Status: active Lead 2    Location: RV     DOI: 02/09/2007     Model #: [id number]     Serial #: [id number]     Status: active Lead 3    Location: LV     DOI: 07/05/2007     Model #: 2952     Serial #: [id number]     Status: active   Indications:  CHF,tachy-brady syndrome   PPM Follow Up Pacer Dependent:  Yes      Episodes Coumadin:  No  Parameters Mode:  VVIR     Lower Rate Limit:  60     Upper Rate Limit:  130  Impression & Recommendations:  Problem # 1:  HYPERTENSION (ICD-401.9) Well controlled Her updated medication list for this problem includes:    Aspirin 81 Mg Tbec (Aspirin) .Marland Kitchen... Take 1 by mouth qd    Metoprolol Tartrate 50 Mg Tabs (Metoprolol tartrate) .Marland Kitchen... Take one tablet by mouth twice a day    Furosemide 80 Mg Tabs (Furosemide)  .Marland Kitchen... Take one tablet by mouth daily.  Problem # 2:  ATRIAL FIBRILLATION (ICD-427.31) Good rate control and anticoagulation.  Continue BB and F.U in coumadin clinic Her updated medication list for this problem includes:    Aspirin 81 Mg Tbec (Aspirin) .Marland Kitchen... Take 1 by mouth qd    Metoprolol Tartrate 50 Mg Tabs (Metoprolol tartrate) .Marland Kitchen... Take one tablet by mouth twice a day    Warfarin Sodium 2 Mg Tabs (Warfarin sodium) ..... Use as directed by anticoagulation clinic  Problem # 3:  PACEMAKER (ICD-V45.Marland Kitchen01) Normal back up function  F/U Dr Ladona Ridgel  Patient Instructions: 1)  Your physician recommends that you schedule a follow-up appointment in: 6 MONTHS

## 2010-09-14 NOTE — Progress Notes (Signed)
Summary: Pt request call  Phone Note Call from Patient Call back at [phone]   Caller: Patient Reason for Call: Talk to Nurse Summary of Call: Pt request call Initial call taken by: Judie Grieve,  December 02, 2009 12:59 PM  Follow-up for Phone Call        spoke with pt son, the pt wanted dr [name] to be aware that dr Christella Hartigan has taken her off the coumadin because of poss bleeding ulcers. will make dr [name] aware and dr Christella Hartigan notes can be reviewed Deliah Goody, RN  December 02, 2009 3:20 PM  Follow-up by: Colon Branch, MD, [facility],  December 02, 2009 5:10 PM

## 2010-09-14 NOTE — Medication Information (Signed)
Summary: rov/sl  Anticoagulant Therapy  Managed by: Eda Keys, PharmD Referring MD: Charlton Haws, MD PCP: [name], MD Supervising MD: Clifton James MD, Cristal Deer Indication 1: Atrial Fibrillation Lab Used: [facility] [place] Site: [address] INR POC 3.0 INR RANGE 2-3  Dietary changes: no    Health status changes: no    Bleeding/hemorrhagic complications: no    Recent/future hospitalizations: no    Any changes in medication regimen? no    Recent/future dental: no  Any missed doses?: no       Is patient compliant with meds? yes       Allergies: 1)  ! * Propylthiouracial 2)  ! * Amiodarone  Anticoagulation Management History:      Positive risk factors for bleeding include an age of 74 years or older, history of GI bleeding, and presence of serious comorbidities.  The bleeding index is 'high risk'.  Positive CHADS2 values include History of CHF and History of HTN.  Negative CHADS2 values include Age > 35 years old.  Her last INR was 1.4 ratio.  Anticoagulation responsible provider: Clifton James MD, Cristal Deer.  INR POC: 3.0.  Cuvette Lot#: [id number].  Exp: 05/2011.    Anticoagulation Management Assessment/Plan:      The patient's current anticoagulation dose is Warfarin sodium 2 mg tabs: Use as directed by Anticoagulation Clinic.  The target INR is 2.0-3.0.  The next INR is due 05/12/2010.  Anticoagulation instructions were given to patient.  Results were reviewed/authorized by Eda Keys, PharmD.  She was notified by Kennieth Francois.         Prior Anticoagulation Instructions: INR 1.7  Take 2 tablets today then increase dose to 1 tablet every day except 1 1/2 tablets on Monday and Friday.  Recheck INR in 2 weeks.   Current Anticoagulation Instructions: INR 3.0  Continue taking one tablet every day except one and one-half tablet on Monday and Friday.  We will see you in three weeks

## 2010-09-14 NOTE — Letter (Signed)
Summary: EGD Instructions  [facility]  [address] [place], [place] [address]   Phone: [phone]  Fax: [phone]       [name]    10/03/1934    MRN: [id number]       Procedure Day /Date:11/26/09     Arrival Time: 1 pm     Procedure Time:2 pm     Location of Procedure:                     [facility] ( Outpatient Registration)    PREPARATION FOR ENDOSCOPY   On 11/26/09  THE DAY OF THE PROCEDURE:  1.   No solid foods, milk or milk products are allowed after midnight the night before your procedure.  2.   Do not drink anything colored red or purple.  Avoid juices with pulp.  No orange juice.  3.  You may drink clear liquids until 10 am , which is 4 hours before your procedure.                                                                                                CLEAR LIQUIDS INCLUDE: Water Jello Ice Popsicles Tea (sugar ok, no milk/cream) Powdered fruit flavored drinks Coffee (sugar ok, no milk/cream) Gatorade Juice: apple, white grape, white cranberry  Lemonade Clear bullion, consomm, broth Carbonated beverages (any kind) Strained chicken noodle soup Hard Candy   MEDICATION INSTRUCTIONS  Unless otherwise instructed, you should take regular prescription medications with a small sip of water as early as possible the morning of your procedure.  Diabetic patients - see separate instructions.   Stay on Coumadin  per Dr Christella Hartigan              OTHER INSTRUCTIONS  You will need a responsible adult at least 74 years of age to accompany you and drive you home.   This person must remain in the waiting room during your procedure.  Wear loose fitting clothing that is easily removed.  Leave jewelry and other valuables at home.  However, you may wish to bring a book to read or an iPod/MP3 player to listen to music as you wait for your procedure to start.  Remove all body piercing jewelry and leave at home.  Total  time from sign-in until discharge is approximately 2-3 hours.  You should go home directly after your procedure and rest.  You can resume normal activities the day after your procedure.  The day of your procedure you should not:   Drive   Make legal decisions   Operate machinery   Drink alcohol   Return to work  You will receive specific instructions about eating, activities and medications before you leave.    The above instructions have been reviewed and explained to me by   Chales Abrahams CMA Duncan Dull)  November 25, 2009 12:41 PM     I fully understand and can verbalize these instructions over the phone Date 11/25/09

## 2010-09-14 NOTE — Medication Information (Signed)
Summary: rov/ewj  Anticoagulant Therapy  Managed by: Bethena Midget, RN, BSN Referring MD: Charlton Haws, MD PCP: [name], MD Supervising MD: Jens Som MD, Arlys John Indication 1: Atrial Fibrillation Lab Used: [facility] [place] Site: [address] INR POC 3.3 INR RANGE 2-3  Dietary changes: yes       Details: sl increase in green leafy veggies  Health status changes: no    Bleeding/hemorrhagic complications: no    Recent/future hospitalizations: no    Any changes in medication regimen? no    Recent/future dental: no  Any missed doses?: no       Is patient compliant with meds? yes       Allergies: 1)  ! * Propylthiouracial 2)  ! * Amiodarone  Anticoagulation Management History:      The patient is taking warfarin and comes in today for a routine follow up visit.  Positive risk factors for bleeding include an age of 66 years or older, history of GI bleeding, and presence of serious comorbidities.  The bleeding index is 'high risk'.  Positive CHADS2 values include History of CHF and History of HTN.  Negative CHADS2 values include Age > 38 years old.  Her last INR was 2.4 ratio.  Anticoagulation responsible provider: Jens Som MD, Arlys John.  INR POC: 3.3.  Cuvette Lot#: [id number].  Exp: 12/2010.    Anticoagulation Management Assessment/Plan:      The patient's current anticoagulation dose is Warfarin sodium 2 mg tabs: Use as directed by Anticoagulation Clinic.  The target INR is 2.0-3.0.  The next INR is due 10/29/2009.  Anticoagulation instructions were given to patient.  Results were reviewed/authorized by Bethena Midget, RN, BSN.  She was notified by Bethena Midget, RN, BSN.         Prior Anticoagulation Instructions: INR 3.6  Skip today's dosage of coumadin, then resume dosage 1 tablet daily except 1.5 tablets on Mondays, Wednesdays, and Fridays.  Recheck in 2 weeks.    Current Anticoagulation Instructions: INR 3.3 Skip today's dose then change dose to 1 pill  everyday except 1.5 pills on Mondays and Fridays. Recheck in 2 weeks.

## 2010-09-14 NOTE — Medication Information (Signed)
Summary: rov/ewj  Anticoagulant Therapy  Managed by: Bethena Midget, RN Referring MD: Charlton Haws, MD PCP: [name], MD Supervising MD: [name] MD, Theron Arista Indication 1: Atrial Fibrillation Lab Used: [facility] [place] Site: [address] INR POC 1.1 INR RANGE 2-3  Dietary changes: no    Health status changes: no    Bleeding/hemorrhagic complications: no    Recent/future hospitalizations: yes       Details: Had colonoscopy 1/17. Last dose of coumadin 1/11. Restarted 1/17.  Any changes in medication regimen? no    Recent/future dental: no  Any missed doses?: yes     Details: Due to colnooscopy.  Is patient compliant with meds? yes       Allergies: 1)  ! * Propylthiouracial 2)  ! * Amiodarone  Anticoagulation Management History:      The patient is taking warfarin and comes in today for a routine follow up visit.  Positive risk factors for bleeding include an age of 42 years or older, history of GI bleeding, and presence of serious comorbidities.  The bleeding index is 'high risk'.  Positive CHADS2 values include History of CHF and History of HTN.  Negative CHADS2 values include Age > 25 years old.  Her last INR was 2.4 ratio.  Anticoagulation responsible provider: [name] MD, Theron Arista.  INR POC: 1.1.  Cuvette Lot#: [id number].  Exp: 11/2010.    Anticoagulation Management Assessment/Plan:      The patient's current anticoagulation dose is Warfarin sodium 2 mg tabs: Use as directed by Anticoagulation Clinic.  The target INR is 2.0-3.0.  The next INR is due 09/09/2009.  Anticoagulation instructions were given to patient.  Results were reviewed/authorized by Bethena Midget, RN.  She was notified by Lew Dawes, PharmD Candidate.         Prior Anticoagulation Instructions: INR 2.0  Continue on same dosage of coumadin 1 tablet daily except 1.5 tablets on Mondays, Wednesdays, and Fridays until off for colonoscopy.  Will Recheck coumadin 1 week after colonoscopy.     Current Anticoagulation Instructions: INR 1.1  Take 2 tablets today and tomorrow then resume current dose of 1 tablet daily except 1.5 tablets on Mondays, Wednesdays, and Fridays. Recheck in 1 week.

## 2010-09-14 NOTE — Progress Notes (Signed)
Summary: Triage  Phone Note Call from Patient Call back at Home Phone [phone]   Caller: Patient Call For: Dr. Christella Hartigan Reason for Call: Talk to Nurse Summary of Call: Pt. had a colonoscopy this past Monday and has had severe Diarrhea Initial call taken by: Karna Christmas,  September 04, 2009 3:35 PM  Follow-up for Phone Call        Pt is having 2 episodes of diarrhea a day since Wed.  Had colon on  Monday.  She is not eating very much.  Not drinking alot.  She has not seen any blood.  Has some cramping with the bowel movements.  Has tried a diarrhea medication given to her by her PCP last summer. She doesn't know the name.   Has not helped.  Follow-up by: Chales Abrahams CMA Duncan Dull),  September 04, 2009 4:09 PM  Additional Follow-up for Phone Call Additional follow up Details #1::        should take a single OTC immodium shortly after waking up every morning.  needs rov with me on monday afternoon Additional Follow-up by: Rachael Fee MD,  September 04, 2009 4:16 PM    Additional Follow-up for Phone Call Additional follow up Details #2::    pt aware appt made for 09/07/09 Follow-up by: Chales Abrahams CMA Duncan Dull),  September 04, 2009 4:23 PM

## 2010-09-14 NOTE — Procedures (Signed)
Summary: Upper Endoscopy  Patient: [name] Note: All result statuses are Final unless otherwise noted.  Tests: (1) Upper Endoscopy (EGD)   EGD Upper Endoscopy       DONE     [facility]     [address] [place], [place]  [address]           ENDOSCOPY PROCEDURE REPORT           PATIENT:  [name], [name]  MR#:  [id number]     BIRTHDATE:  [birth year], 75 yrs. old  GENDER:  female     ENDOSCOPIST:  Rachael Fee, MD     PROCEDURE DATE:  11/26/2009     PROCEDURE:  EGD with biopsy, EGD for control of bleeding     ASA CLASS:  Class II     INDICATIONS:  chronic dark stools, slowly drifting hemoglobin (was     13.7 12/10, was 10.7 yesterday); colonoscopy 2011 no clear cause;     h/o PUD     MEDICATIONS:  Fentanyl 50 mcg IV, Versed 7 mg IV     TOPICAL ANESTHETIC:  Cetacaine Spray           DESCRIPTION OF PROCEDURE:   After the risks benefits and     alternatives of the procedure were thoroughly explained, informed     consent was obtained.  The  endoscope was introduced through the     mouth and advanced to the second portion of the duodenum, without     limitations.  The instrument was slowly withdrawn as the mucosa     was fully examined.     <<PROCEDUREIMAGES>>           There was a 1cm, inflammatory appearing distal gastric polyp. This     was quite friable, there were actually a few small specs of fresh     blood in distlal stomach and ON the polyp. A single mucosal biopsy     was taken from the polyp and there was more than the usual oozing     from the site and so 1cc of dilute epinephrine was injected with     very quick cessation of bleeding (see image2, image1, image6, and     image8).  Otherwise the examination was normal (see image7,     image4, and image3).    Retroflexed views revealed no     abnormalities.    The scope was then withdrawn from the patient     and the procedure completed.           COMPLICATIONS:  None           ENDOSCOPIC  IMPRESSION:     1) Distal gastric polyp that appeared inflammtory; single biopsy     created more than usual bleeding, this was treated with single     injection of dilute epinephrine.  I think this poylp is the source     of her anemia, chronically dark stools.     2) Otherwise normal examination           RECOMMENDATIONS:     If the polyp is proven to be hyperplastic AND she continues to     be anemic then will repeat EGD and attempt to ablate the lesion     with APC.     She is on coumadin and undoubtedly her thin blood contributes to     the amount of blood loss created by the  polyp.           ______________________________     Rachael Fee, MD           cc: [name], MD           n.     Rosalie Doctor:   Rachael Fee at 11/26/2009 03:47 PM           Pearletha Forge, [id number]  Note: An exclamation mark (!) indicates a result that was not dispersed into the flowsheet. Document Creation Date: 11/26/2009 3:47 PM _______________________________________________________________________  (1) Order result status: Final Collection or observation date-time: 11/26/2009 15:34 Requested date-time:  Receipt date-time:  Reported date-time:  Referring Physician:   Ordering Physician: Rob Bunting [phone]) Specimen Source:  Source: Launa Grill Order Number: [id number] Lab site:

## 2010-09-14 NOTE — Progress Notes (Signed)
Summary: Triage  Phone Note Call from Patient Call back at [phone]   Caller: Patient Call For: Dr. Christella Hartigan Reason for Call: Talk to Nurse Summary of Call: pt. has diarrhea, has been taking Diphenatropine. The meds work until she stops taking it and then the diarrhea comes back Initial call taken by: Karna Christmas,  February 17, 2010 1:54 PM  Follow-up for Phone Call        Saw Dr.Little last wk  and he gave her Lomotil and an antibiotic and ordered stool studies but she says as soon as she holds med. gets urgent diarrhea and can't get specimen.Took 2 Lomotil this am so pt.  advised to re-try for specimen when the med.  slows the stool dow.and to check with Dr.Little how to adjust Lomotil so specimen can be obtained. Follow-up by: Teryl Lucy RN,  February 17, 2010 2:09 PM  Additional Follow-up for Phone Call Additional follow up Details #1::        i agree Additional Follow-up by: Rachael Fee MD,  February 17, 2010 4:05 PM

## 2010-09-14 NOTE — Progress Notes (Signed)
  Phone Note Outgoing Call   Call placed by: Lisabeth Devoid RN,  December 09, 2009 4:50 PM Summary of Call: Talked with [name] son [name].  He Left papers here today for Dr. [name] about ? bleeding ulcers and that his mother was taken off coumadin per Dr. Clarene Duke.  Son would like a call back after Dr. [name] reviews papers. I told him Dr. [name] would be in tomorrow (4/28).    Follow-up for Phone Call        Being off coumadin is fine since she just had a gastirc polyp removed.  ? When is her F/U with Christella Hartigan and how long is she going to be off coumadin Follow-up by: Colon Branch, MD, [facility],  December 10, 2009 8:59 AM  Additional Follow-up for Phone Call Additional follow up Details #1::        Spoke with son [name].  Mrs. Eischen has a return appt today with PA of Dr. Christella Hartigan at 11:00am.  She is still have black diarrhea according to son but patient says she is "feeling okay". Additional Follow-up by: Lisabeth Devoid RN,  December 10, 2009 10:19 AM

## 2010-09-14 NOTE — Progress Notes (Signed)
Summary: triage  Phone Note Call from Patient Call back at [phone]   Caller: Son Call For: Christella Hartigan Reason for Call: Talk to Nurse Summary of Call: Son states that patient has severe diarrhea (bloody) and patient is unable to hold it. Son states taht she was suppose to have cbc done but she is unable to have it done because of her diarrhea, wants to know what he should do. Initial call taken by: Tawni Levy,  December 08, 2009 10:25 AM  Follow-up for Phone Call        Pt. is only using Immodium 1 three times a day. Advised  to use according to instructions and to take 2 as soon as she has first loose stool and then wear a depends and come for blood work today or tomorrow.Son says she is having about 3 loose stools a day.Her pcp- Dr.James Little also gave her something for diarrhea but son doesn't know the name. Advised to  not use both meds. Follow-up by: Teryl Lucy RN,  December 08, 2009 11:01 AM  Additional Follow-up for Phone Call Additional follow up Details #1::        ok

## 2010-09-14 NOTE — Medication Information (Signed)
Summary: rov/ln  Anticoagulant Therapy  Managed by: Weston Brass, PharmD Referring MD: Charlton Haws, MD PCP: [name], MD Supervising MD: Johney Frame MD, Fayrene Fearing Indication 1: Atrial Fibrillation Lab Used: [facility] [place] Site: [address] INR POC 1.7 INR RANGE 2-3  Dietary changes: no    Health status changes: no    Bleeding/hemorrhagic complications: no    Recent/future hospitalizations: no    Any changes in medication regimen? no    Recent/future dental: no  Any missed doses?: no       Is patient compliant with meds? yes       Allergies: 1)  ! * Propylthiouracial 2)  ! * Amiodarone  Anticoagulation Management History:      The patient is taking warfarin and comes in today for a routine follow up visit.  Positive risk factors for bleeding include an age of 74 years or older, history of GI bleeding, and presence of serious comorbidities.  The bleeding index is 'high risk'.  Positive CHADS2 values include History of CHF and History of HTN.  Negative CHADS2 values include Age > 2 years old.  Her last INR was 1.4 ratio.  Anticoagulation responsible provider: Yan Pankratz MD, Fayrene Fearing.  INR POC: 1.7.  Exp: 05/2011.    Anticoagulation Management Assessment/Plan:      The patient's current anticoagulation dose is Warfarin sodium 2 mg tabs: Use as directed by Anticoagulation Clinic.  The target INR is 2.0-3.0.  The next INR is due 04/21/2010.  Anticoagulation instructions were given to patient.  Results were reviewed/authorized by Weston Brass, PharmD.  She was notified by Weston Brass PharmD.         Prior Anticoagulation Instructions: INR 2.8  Continue same dose of 1 tab daily except 1.5 tabs on Monday.  Re-check INR in 4 weeks.  Current Anticoagulation Instructions: INR 1.7  Take 2 tablets today then increase dose to 1 tablet every day except 1 1/2 tablets on Monday and Friday.  Recheck INR in 2 weeks.

## 2010-09-14 NOTE — Medication Information (Signed)
Summary: Coumadin Clinic  Anticoagulant Therapy  Managed by: Inactive Referring MD: Charlton Haws, MD PCP: [name], MD Supervising MD: Ladona Ridgel MD, Sharlot Gowda Indication 1: Atrial Fibrillation Lab Used: [facility] [place] Site: [address] INR RANGE 2-3          Comments: Coumadin d/c'ed by Dr. Christella Hartigan- possible bleeding ulcers   Allergies: 1)  ! * Propylthiouracial 2)  ! * Amiodarone  Anticoagulation Management History:      Positive risk factors for bleeding include an age of 30 years or older, history of GI bleeding, and presence of serious comorbidities.  The bleeding index is 'high risk'.  Positive CHADS2 values include History of CHF and History of HTN.  Negative CHADS2 values include Age > 39 years old.  Her last INR was 1.4 ratio.  Anticoagulation responsible provider: Ladona Ridgel MD, Sharlot Gowda.  Exp: 12/2010.    Anticoagulation Management Assessment/Plan:      The patient's current anticoagulation dose is Warfarin sodium 2 mg tabs: Use as directed by Anticoagulation Clinic.  The target INR is 2.0-3.0.  The next INR is due 11/11/2009.  Anticoagulation instructions were given to patient.  Results were reviewed/authorized by Inactive.         Prior Anticoagulation Instructions: INR 4.6  Skip today and tomorrow's dosage of coumadin, then start taking 1 tablet daily except 1.5 tablets on Mondays.  Recheck in 10 days.

## 2010-09-14 NOTE — Progress Notes (Signed)
Summary: pt's heart beating hard  Phone Note Call from Patient   Caller: Patient [phone] Reason for Call: Talk to Nurse Summary of Call: pt calling re heart beating hard when she lays on her left side-pls advise Initial call taken by: Glynda Jaeger,  April 30, 2010 12:26 PM  Follow-up for Phone Call        04/30/10--1100am--pt's daughter calling stating mother is c/o heart beating hard when she lies on left side--pt unable to call herself because she has no minutes on phone--daughter is calling for mother--advised --please go to mothers's house and call me back so i may speak to her--daughter agrees- 9/16--1530pm--called daughter back but she is at store shopping for food for mother--advised--please go to mothers's home so i may speak with her--waiting for return call 04/30/10--1600pm--spoke with pt who stated she was feeling better--advised pt to go ahead and take extra metoprolol when she has this hard heart beat--she doesn't have to wait until evening--also advised to try NTG if this discomfort starts again, if discomfort goes away she should go to nearest ED --pt agrees--nt Follow-up by: Ledon Snare, RN,  April 30, 2010 4:25 PM

## 2010-09-14 NOTE — Medication Information (Signed)
Summary: ccr/jss  Anticoagulant Therapy  Managed by: Bethena Midget, RN, BSN Referring MD: Charlton Haws, MD PCP: [name], MD Supervising MD: [name] MD, Theron Arista Indication 1: Atrial Fibrillation Lab Used: [facility] [place] Site: [address] INR POC 2.5 INR RANGE 2-3  Dietary changes: yes       Details: appetite poor at present  Health status changes: yes       Details: Having N&V, diarrhea occasionally  Bleeding/hemorrhagic complications: no    Recent/future hospitalizations: no    Any changes in medication regimen? yes       Details: Now on Align daily. Iron stopped   Recent/future dental: no  Any missed doses?: no       Is patient compliant with meds? yes      Comments: Seeing Dr Christella Hartigan today.   Allergies: 1)  ! * Propylthiouracial 2)  ! * Amiodarone  Anticoagulation Management History:      The patient is taking warfarin and comes in today for a routine follow up visit.  Positive risk factors for bleeding include an age of 43 years or older, history of GI bleeding, and presence of serious comorbidities.  The bleeding index is 'high risk'.  Positive CHADS2 values include History of CHF and History of HTN.  Negative CHADS2 values include Age > 37 years old.  Her last INR was 1.4 ratio.  Anticoagulation responsible provider: [name] MD, Theron Arista.  INR POC: 2.5.  Cuvette Lot#: [id number].  Exp: 03/2011.    Anticoagulation Management Assessment/Plan:      The patient's current anticoagulation dose is Warfarin sodium 2 mg tabs: Use as directed by Anticoagulation Clinic.  The target INR is 2.0-3.0.  The next INR is due 01/05/2010.  Anticoagulation instructions were given to patient.  Results were reviewed/authorized by Bethena Midget, RN, BSN.  She was notified by Bethena Midget, RN, BSN.         Prior Anticoagulation Instructions: INR 4.6  Skip today and tomorrow's dosage of coumadin, then start taking 1 tablet daily except 1.5 tablets on Mondays.  Recheck in 10  days.    Current Anticoagulation Instructions: INR 2.5 Contiue 1 pill everyday except 1.5 pills on Mondays. Recheck in 2 weeks.

## 2010-09-14 NOTE — Miscellaneous (Signed)
Summary: dx correction  Clinical Lists Changes  Problems: Changed problem from PACEMAKER (ICD-V45..01) to PACEMAKER, PERMANENT (ICD-V45.01)  changed the incorrect dx code to correct dx code 

## 2010-09-14 NOTE — Medication Information (Signed)
Summary: rov/eh  Anticoagulant Therapy  Managed by: Shelby Dubin, PharmD, BCPS, CPP Referring MD: Charlton Haws, MD PCP: [name], MD Supervising MD: Jens Som MD, Arlys John Indication 1: Atrial Fibrillation Lab Used: [facility] [place] Site: [address] INR POC 1.2 INR RANGE 2-3  Dietary changes: no    Health status changes: no    Bleeding/hemorrhagic complications: no    Recent/future hospitalizations: no    Any changes in medication regimen? no    Recent/future dental: no  Any missed doses?: no       Is patient compliant with meds? yes       Allergies: 1)  ! * Propylthiouracial 2)  ! * Amiodarone  Anticoagulation Management History:      The patient comes in today for her initial visit for anticoagulation therapy.  Positive risk factors for bleeding include an age of 74 years or older, history of GI bleeding, and presence of serious comorbidities.  The bleeding index is 'high risk'.  Positive CHADS2 values include History of CHF and History of HTN.  Negative CHADS2 values include Age > 35 years old.  Her last INR was 2.4 ratio.  Anticoagulation responsible provider: Jens Som MD, Arlys John.  INR POC: 1.2.  Cuvette Lot#: [id number].  Exp: 11/2010.    Anticoagulation Management Assessment/Plan:      The patient's current anticoagulation dose is Warfarin sodium 2 mg tabs: Use as directed by Anticoagulation Clinic.  The target INR is 2.0-3.0.  The next INR is due 09/16/2009.  Anticoagulation instructions were given to patient.  Results were reviewed/authorized by Shelby Dubin, PharmD, BCPS, CPP.  She was notified by Ysidro Evert, Pharm D Candidate.         Prior Anticoagulation Instructions: INR 1.1  Take 2 tablets today and tomorrow then resume current dose of 1 tablet daily except 1.5 tablets on Mondays, Wednesdays, and Fridays. Recheck in 1 week.  Current Anticoagulation Instructions: INR: 1.2 Take 2 tablets today and tomorrow then change dosage to 3mg  (1.5  tablet) daily except 2mg  (1tablet) on Tuesdays, Thursdays and Saturdays Recheck in 1 week

## 2010-09-14 NOTE — Miscellaneous (Signed)
Summary: new med: magnesium  Clinical Lists Changes  Medications: Added new medication of MAG-OX 400 400 MG TABS (MAGNESIUM OXIDE) two times a day

## 2010-09-14 NOTE — Procedures (Signed)
Summary: Endoscopy Procedure Report   Endoscopy Procedure Report   Imported By: Roderic Ovens 12/17/2009 11:52:48  _____________________________________________________________________  External Attachment:    Type:   Image     Comment:   External Document

## 2010-09-14 NOTE — Assessment & Plan Note (Signed)
Review of gastrointestinal problems: 1. Gastric ulcers, noted June, 2008 by EGD: several small antral ulcers seen CLO testing negative, she was taking daily NSAIDs And was on Coumadin. Repeat EGD September, 2008 showed healed gastric ulcers. 2. Routine risk for colon cancer, colonoscopy 2011 found no polyps or cancers. Repeat recall recommended at 10 years; Hemorrhoids noted   History of Present Illness Visit Type: Follow-up Visit Primary GI MD: Rob Bunting MD Primary Provider: [name], MD Requesting Provider: Terrial Rhodes, MD Chief Complaint: continuing diarrhea History of Present Illness:      shortly after her colonoscoy in january, she began to have diarrhea.  Immodium helps (one a day) for the past week.  The diarrhea is black.  She is on iron daily.,  does not take Pepto.  she has had no overt hematemesis, overt red rectal bleeding. She complained of similar black stools back in December and a CBC at that time was normal.  she will have 2-3 loose stools a day (without the immodium).    every once in a while she will take excedrine daily.  she takes 81mg  asa a day.  On coumadin.           Current Medications (verified): 1)  Klor-Con M20 20 Meq  Tbcr (Potassium Chloride Crys Cr) .... Take 1 By Mouth Two Times A Day Qd 2)  Simvastatin 40 Mg  Tabs (Simvastatin) .... Take 1 By Mouth Qhs 3)  Aspirin 81 Mg  Tbec (Aspirin) .... Take 1 By Mouth Qd 4)  Metoprolol Tartrate 50 Mg Tabs (Metoprolol Tartrate) .... Take One Tablet By Mouth Twice A Day 5)  Furosemide 80 Mg Tabs (Furosemide) .... Take One Tablet By Mouth Daily. 6)  Alprazolam 0.25 Mg  Tabs (Alprazolam) .... Take 1 By Mouth Three Times A Day Prn 7)  Ferrous Sulfate 325 (65 Fe) Mg  Tabs (Ferrous Sulfate) .Marland Kitchen.. 1 Tab By Mouth Once Daily 8)  Warfarin Sodium 2 Mg Tabs (Warfarin Sodium) .... Use As Directed By Anticoagulation Clinic 9)  Protonix 40 Mg Tbec (Pantoprazole Sodium) .... Take 1 Tablet Daily 10)  Fluoxetine  Hcl 20 Mg Caps (Fluoxetine Hcl) .Marland Kitchen.. 1 Tab By Mouth Once Daily 11)  Calcitriol 0.25 Mcg Caps (Calcitriol) .Marland Kitchen.. 1 Tab By Mouth Once Daily 12)  Chlordiazepoxide-Amitriptyline 5-12.5 Mg Tabs (Chlordiazepoxide-Amitriptyline) .Marland Kitchen.. 1 Tab By Mouth Once Daily 13)  Klor-Con 10 10 Meq Cr-Tabs (Potassium Chloride) .... Take 1 Tablet By Mouth Once A Day in Addition To Two Times A Day. 14)  Cyanocobalamin 1000 Mcg/ml Soln (Cyanocobalamin) .... Inject One Ml Everymonth. 15)  Carafate 1 Gm Tabs (Sucralfate) .... Take T 1 Tablet By Mouth Three Times A Day Before Meals  Allergies (verified): 1)  ! * Propylthiouracial 2)  ! * Amiodarone  Vital Signs:  Patient profile:   76 year old female Height:      67 inches Weight:      202.13 pounds BMI:     31.77 BSA:     2.03 Pulse rate:   72 / minute Pulse rhythm:   regular BP sitting:   142 / 72  (left arm)  Vitals Entered By: Hortense Ramal CMA Duncan Dull) (November 24, 2009 3:38 PM)  Physical Exam  Additional Exam:  Constitutional: generally well appearing Psychiatric: alert and oriented times 3 Abdomen: soft, non-tender, non-distended, normal bowel sounds    Impression & Recommendations:  Problem # 1:  Black stools, diarrhea clinically she does not appear to be anemic. She was nonanemic when she  complained of similar black stools 3-4 months ago. I think it is very unlikely that weeks to months worth of daily black stools fraction melena. More likely this is iron staining of her stools. Her diarrhea is under good control as long as she takes one Imodium a day and she should continue that. Since she does take periodic NSAIDs, daily aspirin, daily Coumadin and has had a personal history of gastric ulcers I think it is reasonable to recheck her CBC now. If she is anemic now proceed with EGD. If she is not anemic then she will simply continue Imodium daily.  Other Orders: TLB-CBC Platelet - w/Differential (85025-CBCD) TLB-BMP (Basic Metabolic Panel-BMET)  (80048-METABOL) TLB-PT (Protime) (85610-PTP)  Patient Instructions: 1)  You will get lab test(s) done today (cbc, bmet, inr). 2)  If you are anemic, will schedule EGD. 3)  If you are not anemic, then you couldnot have been bleeding all this time and so you should continue immodium every day. 4)  The medication list was reviewed and reconciled.  All changed / newly prescribed medications were explained.  A complete medication list was provided to the patient / caregiver.

## 2010-09-14 NOTE — Progress Notes (Signed)
Summary: cx fee?  Phone Note Call from Patient Call back at [facility] Phone [phone]   Caller: Patient Call For: Dr. Christella Hartigan Reason for Call: Talk to Nurse Summary of Call: pt cx'ed same day appt (09/15/2009 @ 2:45PM)... appt was for chronic diarrhea and pt called same day to report that she now has the "diarrhea under control" and would not need to come in... I asked the pt when did her symptoms improve and she said two days ago... informed pt she may be charged a cx fee Dr. Christella Hartigan, do you wish to charge pt with same day cx fee? Initial call taken by: Vallarie Mare,  September 15, 2009 8:45 AM  Follow-up for Phone Call        no, but please let her know that the next time we will charge for last minute cancellations. Follow-up by: Rachael Fee MD,  September 15, 2009 9:41 AM    Additional Follow-up for Phone Call Additional follow up Details #2::    Explained future cancellations to pt... pt resch'ed appt as well

## 2010-09-14 NOTE — Progress Notes (Signed)
Summary: lab reminder  Phone Note Outgoing Call Call back at Home Phone [phone]   Call placed by: Chales Abrahams CMA Duncan Dull),  March 22, 2010 11:43 AM Summary of Call: called to remind pt to have labs Initial call taken by: Chales Abrahams CMA Duncan Dull),  March 22, 2010 11:43 AM

## 2010-09-14 NOTE — Progress Notes (Signed)
  Walk in Patient Form Recieved " Pt left Office Notes from Dr.Little's Office" sent to Message Nurse [facility]  December 09, 2009 3:17 PM

## 2010-09-14 NOTE — Assessment & Plan Note (Signed)
Summary: diarrhea, black stools/pl      [name] pt   History of Present Illness Visit Type: Follow-up Visit Primary GI MD: Rob Bunting MD Primary Provider: [name], MD Requesting Provider: n/a  Chief Complaint: Diarrhea, and black stools  History of Present Illness:   75 YO FEMALE KNOWN TO DR. [name] WHO UNDERWENT EGD ON 4/14 FOR EVALUATION OF ANEMIA AND DARK STOOLS. SHE WAS FOUND TO HAVE A FRIABLE GASTRIC POLYP/1 CM IN SIZE,INFLAMMATORY APPEARING. THIS WAS BX'D THEN INJECTED WITH EPINEPHRINE. BX SHOWS  A HYPERPLASTIC POLYP. SHE HAS BEEN ON COUMADIN LONG TERM,WAS ON COUMADIN AT TIME OF BX -IT HAS SINCE BEEN HELD. SHE COMES IN TODAY WITH C/O PERSISTENT VERY DARK STOOLS AND DIARRHEA. SHE REPORTS ON SET OF DIARRHEA SINCE COLONOSCOPY IN 2/11 AND HAS FREQUENT LIQUID STOOLS -OCCASIONAL INCONTINENCE.SHE USES IMMODIUM WITH GOOD RESULTS BUT IF SHE DOESN'T TAKE CANNOT LEAVE THE HOUSE.CURRENTLY USING 1-2 DAILY.   GI Review of Systems      Denies abdominal pain, acid reflux, belching, bloating, chest pain, dysphagia with liquids, dysphagia with solids, heartburn, loss of appetite, nausea, vomiting, vomiting blood, and  weight loss.      Reports black tarry stools, change in bowel habits, and  diarrhea.     Denies anal fissure, diverticulosis, fecal incontinence, heme positive stool, hemorrhoids, irritable bowel syndrome, jaundice, light color stool, liver problems, rectal bleeding, and  rectal pain.    Current Medications (verified): 1)  Simvastatin 40 Mg  Tabs (Simvastatin) .... Take 1 By Mouth Qhs 2)  Aspirin 81 Mg  Tbec (Aspirin) .... Take 1 By Mouth Qd 3)  Metoprolol Tartrate 50 Mg Tabs (Metoprolol Tartrate) .... Take One Tablet By Mouth Twice A Day 4)  Furosemide 80 Mg Tabs (Furosemide) .... Take One Tablet By Mouth Daily. 5)  Alprazolam 0.25 Mg  Tabs (Alprazolam) .... Take 1 By Mouth Three Times A Day Prn 6)  Ferrous Sulfate 325 (65 Fe) Mg  Tabs (Ferrous Sulfate) .Marland Kitchen.. 1 Tab By Mouth Once  Daily 7)  Warfarin Sodium 2 Mg Tabs (Warfarin Sodium) .... Use As Directed By Anticoagulation Clinic 8)  Protonix 40 Mg Tbec (Pantoprazole Sodium) .... Take 1 Tablet Daily 9)  Fluoxetine Hcl 20 Mg Caps (Fluoxetine Hcl) .Marland Kitchen.. 1 Tab By Mouth Once Daily 10)  Calcitriol 0.25 Mcg Caps (Calcitriol) .Marland Kitchen.. 1 Tab By Mouth Once Daily 11)  Chlordiazepoxide-Amitriptyline 5-12.5 Mg Tabs (Chlordiazepoxide-Amitriptyline) .Marland Kitchen.. 1 Tab By Mouth Once Daily 12)  Klor-Con 10 10 Meq Cr-Tabs (Potassium Chloride) .... Take 1 Tablet By Mouth Once A Day in Addition To Two Times A Day. 13)  Cyanocobalamin 1000 Mcg/ml Soln (Cyanocobalamin) .... Inject One Ml Everymonth. 14)  Imodium A-D 2 Mg Tabs (Loperamide Hcl) .... One Tablet By Mouth Two Times A Day  Allergies (verified): 1)  ! * Propylthiouracial 2)  ! * Amiodarone  Past History:  Past Medical History: PACEMAKER (ICD-V45.Marland Kitchen01) BRADYCARDIA (ICD-427.89) ANEMIA (ICD-285.9) HYPERPLASTIC GASTRIC POLYP 4/11 CELLULITIS (ICD-682.9) CAD, UNSPECIFIED SITE (ICD-414.00) EDEMA (ICD-782.3) HYPERTHYROIDISM (ICD-242.90) RENAL INSUFFICIENCY (ICD-588.9) HYPERTENSION (ICD-401.9) GASTROINTESTINAL HEMORRHAGE, HX OF (ICD-V12.79)-GASTRIC ULCER 2008 GERD (ICD-530.81) DEPRESSION (ICD-311) CONGESTIVE HEART FAILURE (ICD-428.0) ATRIAL FIBRILLATION (ICD-427.31) ANTICOAGULATION THERAPY (ICD-V58.61)   Past Surgical History: Permanent pacemaker EGD (04/06/2007)/COLON 2/11/EGD 4/11 Percutaneous transluminal coronary angioplasty Stress Myoview 03/04/2005) Transthoracic Echocardiogram (02/27/2007)   Family History: no thyroid disease. No FH of Colon Cancer:  Social History: Reviewed history from 01/19/2009 and no changes required. patient is widowed and retired.  She is here today with her son. Tobacco Use -  No.  Alcohol Use - no Activity limited by knee pain  Review of Systems       The patient complains of arthritis/joint pain.  The patient denies allergy/sinus,  anemia, anxiety-new, back pain, blood in urine, breast changes/lumps, change in vision, confusion, cough, coughing up blood, depression-new, fainting, fatigue, fever, headaches-new, hearing problems, heart murmur, heart rhythm changes, itching, menstrual pain, muscle pains/cramps, night sweats, nosebleeds, pregnancy symptoms, shortness of breath, skin rash, sleeping problems, sore throat, swelling of feet/legs, swollen lymph glands, thirst - excessive , urination - excessive , urination changes/pain, urine leakage, vision changes, and voice change.         ROS OTHERWISE AS IN HPI  Vital Signs:  Patient profile:   76 year old female Height:      67 inches Weight:      198 pounds Pulse rate:   76 / minute Pulse rhythm:   regular BP sitting:   128 / 80  (left arm) Cuff size:   regular  Vitals Entered By: Ok Anis CMA (December 10, 2009 10:48 AM)  Physical Exam  General:  Well developed, well nourished, no acute distress. Head:  Normocephalic and atraumatic. Eyes:  PERRLA, no icterus. Lungs:  Clear throughout to auscultation. Heart:  Regular rate and rhythm; no murmurs, rubs,  or bruits. Abdomen:  SOFT, NONTENDER, NO MASSOR HSM,BS+ Rectal:  DARK BROWN STOOL, HEME NEGATIVE Extremities:  No clubbing, cyanosis, edema or deformities noted. Neurologic:  Alert and  oriented x4;  grossly normal neurologically.weakness noted.   Psych:  Alert and cooperative. Normal mood and affect.   Impression & Recommendations:  Problem # 1:  BLOOD IN STOOL-MELENA (ICD-578.1) Assessment New 75 YO FEMALE S/P BX OF FRIABLE GASTRIC POLYP 11/26/09 ,WHILE ON COUMADIN-NOW WITH C/O DARK STOOLS  X SEVERAL DAYS.-STOOL IS HEME NEGATIVE ON EXAM TODAY. DARK STOOL LIKELY SECONDARY TO IRON  CBC 12/09/09 -HGB 11.1 ,IMPROVED WAS 10.7 4/12 COUMADIN HAS BEEN ON HOLD -BELIEVE OK RO RESTART FOLLOW UP WITH DR. [name] TO DISCUSS REMOVING POLYP  Problem # 2:  DIARRHEA (ICD-787.91) Assessment: Unchanged DIARRHEA X 2 MONTHS  ,ONSET POST COLONOSCOPY;ETIOLOGY NOT CLEAR,NO NEW MEDS,?INFECTIOUS  CONTINUE IMMODIUM,ONE EVERY MORNING THEN AS NEEDED ADD TRIAL OF ALIGN ONE DAILY X ONE MONTH CHECK STOOL CULTURES,WBC'S Orders: T-Culture, Stool ([id number]) T-Culture, C-Diff Toxin A/B ([id number]) T-Stool Giardia / Crypto- EIA ([id number]) T-Stool for O&P ([id number]) T-Fecal WBC ([id number])  Problem # 3:  CONGESTIVE HEART FAILURE (ICD-428.00) Assessment: Comment Only  Problem # 4:  PACEMAKER (ICD-V45.Marland Kitchen01) Assessment: Comment Only  Problem # 5:  CAD, UNSPECIFIED SITE (ICD-414.00) Assessment: Comment Only  Problem # 6:  RENAL INSUFFICIENCY (ICD-588.9) Assessment: Comment Only  Patient Instructions: 1)  Go to the lab, basement level. 2)  Continue Imodium daily, at least 1 daily in the morning. 3)  Stop the Iron.  4)  Restart Coumadin.  5)  We made you ann appointment to follow up with Dr. [name] on 12-22-09.  6)  Copy sent to : [name], MD 7)  Take 1 Align capsule daily x 28 days. 8)  The medication list was reviewed and reconciled.  All changed / newly prescribed medications were explained.  A complete medication list was provided to the patient / caregiver.

## 2010-09-14 NOTE — Progress Notes (Signed)
Summary: Chest pains last night  Phone Note Call from Patient Call back at Home Phone [phone]   Caller: Patient Summary of Call: Chest pains  last night took Nitro Initial call taken by: Judie Grieve,  May 04, 2010 2:20 PM  Follow-up for Phone Call        I talked with pt by telephone--she states she continues to have irregular pulse that doesn't  seem to be helped by taking extra metoprolol--she states it feels like at fib that she has had in the past--she did verify she is taking coumadin--I discussed with Paula-she is unable to interogate pt's pacemaker over the telephone--I have given pt appt to see PA tomorrow at 11:45--pt also mention some  chest pain --the last episode was last night  --she  is aware that she should take NTG as needed for chest  pain and if it is unrelieved with NTG she should call 911--pt agreed with this plan and will have son drive her to appt tomorrow

## 2010-09-14 NOTE — Progress Notes (Signed)
Summary: Pt heart beating hard  Phone Note Call from Patient Call back at Home Phone [phone]   Caller: Patient Summary of Call: Pt  request call regarding her heart beating hard just when down on left side Initial call taken by: Judie Grieve,  April 16, 2010 9:05 AM  Follow-up for Phone Call        spoke with pt, she is having hard heart beats in the evening when she lies on her left side. she thinks it is beating fast but has not counted her pulse. she c/o occ SOB. pt instructed to take an extra 1/2 metoprolol in the evening if needed. she will call with further problems Deliah Goody, RN  April 16, 2010 1:09 PM  Follow-up by: Deliah Goody, RN,  April 16, 2010 1:01 PM

## 2010-09-14 NOTE — Progress Notes (Signed)
Summary: EGD  Phone Note Outgoing Call Call back at [facility] Phone [phone]   Call placed by: Chales Abrahams CMA Duncan Dull),  November 25, 2009 11:26 AM Summary of Call: left message on machine to call back regarding EGD tomorrow at [facility] Initial call taken by: Chales Abrahams CMA Duncan Dull),  November 25, 2009 11:26 AM  Follow-up for Phone Call        OK to stay on coumadin Follow-up by: Rachael Fee MD,  November 25, 2009 11:30 AM  Additional Follow-up for Phone Call Additional follow up Details #1::        pt aware and instructed. Additional Follow-up by: Chales Abrahams CMA Duncan Dull),  November 25, 2009 12:39 PM

## 2010-09-14 NOTE — Medication Information (Signed)
Summary: rov/ewj  Anticoagulant Therapy  Managed by: Cloyde Reams, RN, BSN Referring MD: Charlton Haws, MD PCP: [name], MD Supervising MD: Shirlee Latch MD, Dalton Indication 1: Atrial Fibrillation Lab Used: [facility] [place] Site: [address] INR POC 3.6 INR RANGE 2-3  Dietary changes: yes       Details: Decr appetite d/t illness.  Health status changes: no    Bleeding/hemorrhagic complications: no    Recent/future hospitalizations: no    Any changes in medication regimen? yes       Details: Started on azithromycin 250mg  yesterday x 5 days.    Recent/future dental: no  Any missed doses?: no       Is patient compliant with meds? yes       Allergies: 1)  ! * Propylthiouracial 2)  ! * Amiodarone  Anticoagulation Management History:      The patient is taking warfarin and comes in today for a routine follow up visit.  Positive risk factors for bleeding include an age of 74 years or older, history of GI bleeding, and presence of serious comorbidities.  The bleeding index is 'high risk'.  Positive CHADS2 values include History of CHF and History of HTN.  Negative CHADS2 values include Age > 52 years old.  Her last INR was 2.4 ratio.  Anticoagulation responsible provider: Shirlee Latch MD, [place].  INR POC: 3.6.  Cuvette Lot#: [id number].  Exp: 11/2010.    Anticoagulation Management Assessment/Plan:      The patient's current anticoagulation dose is Warfarin sodium 2 mg tabs: Use as directed by Anticoagulation Clinic.  The target INR is 2.0-3.0.  The next INR is due 10/16/2009.  Anticoagulation instructions were given to patient.  Results were reviewed/authorized by Cloyde Reams, RN, BSN.  She was notified by Cloyde Reams RN.         Prior Anticoagulation Instructions: INR 2.1  Continue on same dosage 1 tablet daily except 1.5 tablets on Mondays, Wednesdays, and Fridays.  Recheck in 10 days.    Current Anticoagulation Instructions: INR 3.6  Skip today's dosage of  coumadin, then resume dosage 1 tablet daily except 1.5 tablets on Mondays, Wednesdays, and Fridays.  Recheck in 2 weeks.

## 2010-09-14 NOTE — Assessment & Plan Note (Signed)
Summary: rov   Referring Provider:  na Primary Provider:  [name], MD  CC:  chest pain.  History of Present Illness: This is a 76 year old white female patient with history of paroxysmal atrial fibrillation, and coronary artery disease with a stent to the LAD in 2009. Her last stress myoview test was in June 2010 which was normal.  Patient complains of several month history of worsening palpitations. She says she notices it most when she lays on her left side but it does occur with activity. She also has been having twinges of chest pain that move around from her left chest to the center of her chest that only last a few seconds. She denies any chest tightness, pressure, radiation of the pain, dizziness, or presyncope. She does have chronic dyspnea on exertion that has not changed.    Current Medications (verified): 1)  Simvastatin 40 Mg  Tabs (Simvastatin) .... Take 1 By Mouth Qhs 2)  Aspirin 81 Mg  Tbec (Aspirin) .... Take 1 By Mouth Qd 3)  Metoprolol Tartrate 50 Mg Tabs (Metoprolol Tartrate) .Marland Kitchen.. 1 Tab Every Am 2 Tabs Evry Pm 4)  Furosemide 80 Mg Tabs (Furosemide) .... Take One Tablet By Mouth Daily. 5)  Alprazolam 0.25 Mg  Tabs (Alprazolam) .... Take 1 By Mouth Three Times A Day Prn 6)  Warfarin Sodium 2 Mg Tabs (Warfarin Sodium) .... Use As Directed By Anticoagulation Clinic 7)  Protonix 40 Mg Tbec (Pantoprazole Sodium) .... Take 1 Tablet Daily 8)  Fluoxetine Hcl 20 Mg Caps (Fluoxetine Hcl) .Marland Kitchen.. 1 Tab By Mouth Once Daily 9)  Calcitriol 0.25 Mcg Caps (Calcitriol) .Marland Kitchen.. 1 Tab By Mouth Once Daily 10)  Chlordiazepoxide-Amitriptyline 5-12.5 Mg Tabs (Chlordiazepoxide-Amitriptyline) .Marland Kitchen.. 1 Tab By Mouth Once Daily 11)  Klor-Con 10 10 Meq Cr-Tabs (Potassium Chloride) .Marland Kitchen.. 1 Tab Two Times A Day 12)  Cyanocobalamin 1000 Mcg/ml Soln (Cyanocobalamin) .... Inject One Ml Everymonth. 13)  Imodium A-D 2 Mg Tabs (Loperamide Hcl) .... One Tablet By Mouth Two Times A Day As Needed 14)  Mag-Ox 400  400 Mg Tabs (Magnesium Oxide) .... Two Times A Day 15)  Percocet 5-325 Mg Tabs (Oxycodone-Acetaminophen) .... 1/2  Tab Four Times A Day  Allergies: 1)  ! * Propylthiouracial 2)  ! * Amiodarone  Past History:  Past Medical History: Last updated: 12/10/2009 PACEMAKER (ICD-V45.Marland Kitchen01) BRADYCARDIA (ICD-427.89) ANEMIA (ICD-285.9) HYPERPLASTIC GASTRIC POLYP 4/11 CELLULITIS (ICD-682.9) CAD, UNSPECIFIED SITE (ICD-414.00) EDEMA (ICD-782.3) HYPERTHYROIDISM (ICD-242.90) RENAL INSUFFICIENCY (ICD-588.9) HYPERTENSION (ICD-401.9) GASTROINTESTINAL HEMORRHAGE, HX OF (ICD-V12.79)-GASTRIC ULCER 2008 GERD (ICD-530.81) DEPRESSION (ICD-311) CONGESTIVE HEART FAILURE (ICD-428.0) ATRIAL FIBRILLATION (ICD-427.31) ANTICOAGULATION THERAPY (ICD-V58.61)   Past Surgical History: Last updated: 12/10/2009 Permanent pacemaker EGD (04/06/2007)/COLON 2/11/EGD 4/11 Percutaneous transluminal coronary angioplasty Stress Myoview 03/04/2005) Transthoracic Echocardiogram (02/27/2007)   Review of Systems       see history of present illness.Patient drinks 3 cups of coffee a day.  Vital Signs:  Patient profile:   76 year old female Height:      67 inches Weight:      179 pounds BMI:     28.14 Pulse rate:   69 / minute Resp:     12 per minute BP sitting:   152 / 95  (left arm)  Vitals Entered By: Kem Parkinson (May 05, 2010 11:51 AM)  Physical Exam  General:   Well-nournished, in no acute distress. Neck: No JVD, HJR, Bruit, or thyroid enlargement Lungs: No tachypnea, clear without wheezing, rales, or rhonchi Cardiovascular: RRR, PMI not displaced, heart sounds normal, no  murmurs, gallops, bruit, thrill, or heave. Abdomen: BS normal. Soft without organomegaly, masses, lesions or tenderness. Extremities: without cyanosis, clubbing or edema. Good distal pulses bilateral SKin: Warm, no lesions or rashes  Musculoskeletal: No deformities Neuro: no focal signs    EKG  Procedure date:   05/05/2010  Findings:      paced rhythm with underlying atrial flutter  PPM Specifications Following MD:  Lewayne Bunting, MD     PPM Vendor:  [employer]     PPM Model Number:  [id number]     PPM Serial Number:  [id number] PPM DOI:  07/05/2007      Lead 1    Location: atrial     DOI: 02/09/2007     Model #: [id number]     Serial #: [id number]     Status: active Lead 2    Location: RV     DOI: 02/09/2007     Model #: [id number]     Serial #: [id number]     Status: active Lead 3    Location: LV     DOI: 07/05/2007     Model #: [id number]     Serial #: [id number]     Status: active   Indications:  CHF,tachy-brady syndrome   PPM Follow Up Pacer Dependent:  Yes      Episodes Coumadin:  No  Parameters Mode:  VVIR     Lower Rate Limit:  60     Upper Rate Limit:  130  Impression & Recommendations:  Problem # 1:  ATRIAL FIBRILLATION (ICD-427.31) Patient had a pacemaker check today and was having some diaphragmatic stimulation when laying on her left side. They reduced her from 2.5-2. I will also increase her metoprolol to 100 mg b.i.d. Her updated medication list for this problem includes:    Aspirin 81 Mg Tbec (Aspirin) .Marland Kitchen... Take 1 by mouth qd    Metoprolol Tartrate 50 Mg Tabs (Metoprolol tartrate) .Marland Kitchen... 2 tabs two times a day    Warfarin Sodium 2 Mg Tabs (Warfarin sodium) ..... Use as directed by anticoagulation clinic  Problem # 2:  CAD, NATIVE VESSEL (ICD-414.01)  Her updated medication list for this problem includes:    Aspirin 81 Mg Tbec (Aspirin) .Marland Kitchen... Take 1 by mouth qd    Metoprolol Tartrate 50 Mg Tabs (Metoprolol tartrate) .Marland Kitchen... 2 tabs two times a day    Warfarin Sodium 2 Mg Tabs (Warfarin sodium) ..... Use as directed by anticoagulation clinic  Problem # 3:  HYPERTENSION (ICD-401.9)  Her updated medication list for this problem includes:    Aspirin 81 Mg Tbec (Aspirin) .Marland Kitchen... Take 1 by mouth qd    Metoprolol Tartrate 50 Mg Tabs (Metoprolol tartrate) .Marland Kitchen... 2 tabs two times a day    Furosemide 80 Mg  Tabs (Furosemide) .Marland Kitchen... Take one tablet by mouth daily.  Problem # 4:  ANTICOAGULATION THERAPY (ICD-V58.61) Patient is on Coumadin for her atrial fibrillation.  Other Orders: EKG w/ Interpretation (93000)  Patient Instructions: 1)  Your physician recommends that you schedule a follow-up appointment in: AS SCHEDULED 2)  Your physician has recommended you make the following change in your medication: METOPROLOL TO 100 MG two times a day  3)  Call if he continued to have palpitations or chest pain. Prescriptions: METOPROLOL TARTRATE 50 MG TABS (METOPROLOL TARTRATE) 2 TABS two times a day  #120 x 11   Entered by:   Scherrie Bateman, LPN   Authorized by:   Marletta Lor, PA-C   Signed by:  Scherrie Bateman, LPN on 16/05/9603   Method used:   Electronically to        [facility] [phone]* (retail)       [address]       [place], [place]  [address]       Ph: [phone] or [phone]       Fax: [phone]   RxID:   [id number]

## 2010-09-14 NOTE — Assessment & Plan Note (Signed)
Review of gastrointestinal problems: 1. Gastric ulcers, noted June, 2008 by EGD: several small antral ulcers seen CLO testing negative, she was taking daily NSAIDs And was on Coumadin. Repeat EGD September, 2008 showed healed gastric ulcers.  Repeat EGD April, 2011 found inflamed appearing, hyperplastic on biopsy, distal gastric polyp that was very friable. Question that this was the source of her chronic anemia. 2. Routine risk for colon cancer, colonoscopy 2011 found no polyps or cancers. Repeat recall recommended at 10 years; Hemorrhoids noted   History of Present Illness Visit Type: Follow-up Visit Primary GI MD: Rob Bunting MD Primary Provider: [name], MD Requesting Provider: n/a  Chief Complaint: diarrhea History of Present Illness:    She stopped iron tablets and black stools resolved.  She is not having much diarrhea either.  Has not needed any immodium in 2-3 days.  Was going 10times a day, very loose stools.  She is getting constipated recently.           Current Medications (verified): 1)  Simvastatin 40 Mg  Tabs (Simvastatin) .... Take 1 By Mouth Qhs 2)  Aspirin 81 Mg  Tbec (Aspirin) .... Take 1 By Mouth Qd 3)  Metoprolol Tartrate 50 Mg Tabs (Metoprolol Tartrate) .... Take One Tablet By Mouth Twice A Day 4)  Furosemide 80 Mg Tabs (Furosemide) .... Take One Tablet By Mouth Daily. 5)  Alprazolam 0.25 Mg  Tabs (Alprazolam) .... Take 1 By Mouth Three Times A Day Prn 6)  Warfarin Sodium 2 Mg Tabs (Warfarin Sodium) .... Use As Directed By Anticoagulation Clinic 7)  Protonix 40 Mg Tbec (Pantoprazole Sodium) .... Take 1 Tablet Daily 8)  Fluoxetine Hcl 20 Mg Caps (Fluoxetine Hcl) .Marland Kitchen.. 1 Tab By Mouth Once Daily 9)  Calcitriol 0.25 Mcg Caps (Calcitriol) .Marland Kitchen.. 1 Tab By Mouth Once Daily 10)  Chlordiazepoxide-Amitriptyline 5-12.5 Mg Tabs (Chlordiazepoxide-Amitriptyline) .Marland Kitchen.. 1 Tab By Mouth Once Daily 11)  Klor-Con 10 10 Meq Cr-Tabs (Potassium Chloride) .... Take 1 Tablet By  Mouth Once A Day in Addition To Two Times A Day. 12)  Cyanocobalamin 1000 Mcg/ml Soln (Cyanocobalamin) .... Inject One Ml Everymonth. 13)  Imodium A-D 2 Mg Tabs (Loperamide Hcl) .... One Tablet By Mouth Two Times A Day As Needed  Allergies (verified): 1)  ! * Propylthiouracial 2)  ! * Amiodarone  Vital Signs:  Patient profile:   76 year old female Height:      67 inches Weight:      199 pounds BMI:     31.28 BSA:     2.02 Pulse rate:   76 / minute Pulse rhythm:   regular BP sitting:   142 / 80  (left arm) Cuff size:   regular  Vitals Entered By: Ok Anis CMA (Dec 22, 2009 1:11 PM)  Physical Exam  Additional Exam:  Constitutional: generally well appearing Psychiatric: alert and oriented times 3 Abdomen: soft, non-tender, non-distended, normal bowel sounds    Impression & Recommendations:  Problem # 1:  Distal gastric inflammatory polyp this polyp was quite friable and may indeed be causing chronic iron deficiency anemia. She stopped her iron and her black stools went away. Her diarrhea is improving as well. Her hemoglobin was up at last check to 11.1. We will repeat her CBC in one month and if her hemoglobin is trending down off the iron then we will repeat EGD with treatment to the inflammatory polyps, likely APC. She will continue on Coumadin.  Patient Instructions: 1)  Repeat CBC in 4 weeks,  if Hb trending down then we will schedule for repeat EGD, treatment to the stomach polyp. 2)  For now, stay off iron.  Continue on coumadin. 3)  The medication list was reviewed and reconciled.  All changed / newly prescribed medications were explained.  A complete medication list was provided to the patient / caregiver.

## 2010-09-14 NOTE — Cardiovascular Report (Signed)
Summary: Office Visit   Office Visit   Imported By: Roderic Ovens 05/24/2010 11:46:55  _____________________________________________________________________  External Attachment:    Type:   Image     Comment:   External Document

## 2010-09-14 NOTE — Medication Information (Signed)
Summary: rov/tm  Anticoagulant Therapy  Managed by: Cloyde Reams, RN, BSN Referring MD: Charlton Haws, MD PCP: [name], MD Supervising MD: Jens Som MD, Arlys John Indication 1: Atrial Fibrillation Lab Used: [facility] [place] Site: [address] INR POC 2.1 INR RANGE 2-3  Dietary changes: no    Health status changes: no    Bleeding/hemorrhagic complications: no    Recent/future hospitalizations: no    Any changes in medication regimen? no    Recent/future dental: no  Any missed doses?: no       Is patient compliant with meds? yes      Comments: Pt states she has been taking 1.5 tablets on MWF, 1 tablet all other days.    Allergies (verified): 1)  ! * Propylthiouracial 2)  ! * Amiodarone  Anticoagulation Management History:      The patient is taking warfarin and comes in today for a routine follow up visit.  Positive risk factors for bleeding include an age of 66 years or older, history of GI bleeding, and presence of serious comorbidities.  The bleeding index is 'high risk'.  Positive CHADS2 values include History of CHF and History of HTN.  Negative CHADS2 values include Age > 62 years old.  Her last INR was 2.4 ratio.  Anticoagulation responsible provider: Jens Som MD, Arlys John.  INR POC: 2.1.  Cuvette Lot#: [id number].  Exp: 11/2010.    Anticoagulation Management Assessment/Plan:      The patient's current anticoagulation dose is Warfarin sodium 2 mg tabs: Use as directed by Anticoagulation Clinic.  The target INR is 2.0-3.0.  The next INR is due 10/02/2009.  Anticoagulation instructions were given to patient.  Results were reviewed/authorized by Cloyde Reams, RN, BSN.  She was notified by Cloyde Reams RN.         Prior Anticoagulation Instructions: INR: 1.2 Take 2 tablets today and tomorrow then change dosage to 3mg  (1.5 tablet) daily except 2mg  (1tablet) on Tuesdays, Thursdays and Saturdays Recheck in 1 week  Current Anticoagulation Instructions: INR  2.1  Continue on same dosage 1 tablet daily except 1.5 tablets on Mondays, Wednesdays, and Fridays.  Recheck in 10 days.

## 2010-09-14 NOTE — Progress Notes (Signed)
Summary: Questions about coumadin checks  Phone Note Call from Patient Call back at [phone]   Caller: Patient Summary of Call: Pt have questions about coumadin checks Initial call taken by: Judie Grieve,  December 10, 2009 1:05 PM  Follow-up for Phone Call        Spoke with pt's son.  Per GI today, she can restart Coumadin.  Informed son to restart at her old dose of 1 tablet every day except 1 1/2 tablets on Monday.  Will recheck INR on 5/6 at 2:45 Follow-up by: Weston Brass PharmD,  December 10, 2009 4:50 PM

## 2010-09-14 NOTE — Miscellaneous (Signed)
Summary: dx correction   Clinical Lists Changes  Problems: Changed problem from CONGESTIVE HEART FAILURE (ICD-428.00) to CONGESTIVE HEART FAILURE (ICD-428.0) changed the incorrect dx code to correct dx code Donna Keene  June 11, 2010 1:56 PM 

## 2010-09-14 NOTE — Medication Information (Signed)
Summary: ccr  Anticoagulant Therapy  Managed by: Elaina Pattee, PharmD Referring MD: Charlton Haws, MD PCP: [name], MD Supervising MD: Riley Kill MD, Maisie Fus Indication 1: Atrial Fibrillation Lab Used: [facility] [place] Site: [address] INR POC 2.5 INR RANGE 2-3  Dietary changes: yes       Details: Appetite remains poor.  Health status changes: yes       Details: Diarrhea unchanged.  Seeing Dr. Christella Hartigan 01/18/10.  Bleeding/hemorrhagic complications: no    Recent/future hospitalizations: no    Any changes in medication regimen? no    Recent/future dental: no  Any missed doses?: no       Is patient compliant with meds? yes       Allergies: 1)  ! * Propylthiouracial 2)  ! * Amiodarone  Anticoagulation Management History:      The patient is taking warfarin and comes in today for a routine follow up visit.  Positive risk factors for bleeding include an age of 74 years or older, history of GI bleeding, and presence of serious comorbidities.  The bleeding index is 'high risk'.  Positive CHADS2 values include History of CHF and History of HTN.  Negative CHADS2 values include Age > 74 years old.  Her last INR was 1.4 ratio.  Anticoagulation responsible provider: Riley Kill MD, Maisie Fus.  INR POC: 2.5.  Cuvette Lot#: [id number].  Exp: 03/2011.    Anticoagulation Management Assessment/Plan:      The patient's current anticoagulation dose is Warfarin sodium 2 mg tabs: Use as directed by Anticoagulation Clinic.  The target INR is 2.0-3.0.  The next INR is due 02/10/2010.  Anticoagulation instructions were given to patient.  Results were reviewed/authorized by Elaina Pattee, PharmD.  She was notified by Elaina Pattee, PharmD.         Prior Anticoagulation Instructions: INR 2.5 Contiue 1 pill everyday except 1.5 pills on Mondays. Recheck in 2 weeks.   Current Anticoagulation Instructions: INR 2.5. Take 1 tablet daily except 1.5 tablets on Monday.  Notify Coumadin Clinic of  any procedures.  Recheck in 4 weeks.

## 2010-09-14 NOTE — Procedures (Signed)
Summary: Cardiology Device Clinic   Current Medications (verified): 1)  Simvastatin 40 Mg  Tabs (Simvastatin) .... Take 1 By Mouth Qhs 2)  Aspirin 81 Mg  Tbec (Aspirin) .... Take 1 By Mouth Qd 3)  Metoprolol Tartrate 50 Mg Tabs (Metoprolol Tartrate) .... 2 Tabs Two Times A Day 4)  Furosemide 80 Mg Tabs (Furosemide) .... Take One Tablet By Mouth Daily. 5)  Alprazolam 0.25 Mg  Tabs (Alprazolam) .... Take 1 By Mouth Three Times A Day Prn 6)  Warfarin Sodium 2 Mg Tabs (Warfarin Sodium) .... Use As Directed By Anticoagulation Clinic 7)  Protonix 40 Mg Tbec (Pantoprazole Sodium) .... Take 1 Tablet Daily 8)  Fluoxetine Hcl 20 Mg Caps (Fluoxetine Hcl) .Marland Kitchen.. 1 Tab By Mouth Once Daily 9)  Calcitriol 0.25 Mcg Caps (Calcitriol) .Marland Kitchen.. 1 Tab By Mouth Once Daily 10)  Chlordiazepoxide-Amitriptyline 5-12.5 Mg Tabs (Chlordiazepoxide-Amitriptyline) .Marland Kitchen.. 1 Tab By Mouth Once Daily 11)  Klor-Con 10 10 Meq Cr-Tabs (Potassium Chloride) .Marland Kitchen.. 1 Tab Two Times A Day 12)  Cyanocobalamin 1000 Mcg/ml Soln (Cyanocobalamin) .... Inject One Ml Everymonth. 13)  Imodium A-D 2 Mg Tabs (Loperamide Hcl) .... One Tablet By Mouth Two Times A Day As Needed 14)  Mag-Ox 400 400 Mg Tabs (Magnesium Oxide) .... Two Times A Day 15)  Percocet 5-325 Mg Tabs (Oxycodone-Acetaminophen) .... 1/2  Tab Four Times A Day  Allergies (verified): 1)  ! * Propylthiouracial 2)  ! * Amiodarone  PPM Specifications Following MD:  Lewayne Bunting, MD     PPM Vendor:  [employer]     PPM Model Number:  [id number]     PPM Serial Number:  [id number] PPM DOI:  07/05/2007      Lead 1    Location: atrial     DOI: 02/09/2007     Model #: [id number]     Serial #: [id number]     Status: active Lead 2    Location: RV     DOI: 02/09/2007     Model #: [id number]     Serial #: [id number]     Status: active Lead 3    Location: LV     DOI: 07/05/2007     Model #: [id number]     Serial #: [id number]     Status: active   Indications:  CHF,tachy-brady syndrome   PPM Follow Up Battery Voltage:   3.002 V     Battery Est. Longevity:  6 yrs     Pacer Dependent:  Yes     Right Ventricle  Amplitude: 8.00 mV, Impedance: 563 ohms, Threshold: 1.00 V at 0.40 msec Left Ventricle  Impedance: 633 ohms, Threshold: 1.00 V at 0.40 msec  Episodes MS Episodes:  0     Coumadin:  No Ventricular High Rate:  0     Ventricular Pacing:  97.9%  Parameters Mode:  VVIR     Lower Rate Limit:  60     Upper Rate Limit:  130 Next Cardiology Appt Due:  06/29/2010 Tech Comments:  PT SEEING PA--PT HAVING PROBLEMS W/DIAPHRAMATIC STIM WHEN LAYING ON LEFT SIDE.  NORMAL DEVICE FUNCTION.  CHANGED LV OUTPUT FROM 2.50 TO 2.00 DUE TO LV THRESHOLD BEING 1.00 @ 0.52ms.  PT LAID ON LEFT SIDE W/NO DIAPHRAMATIC STIM.  ROV 06-29-10 W/GT. [name]  May 05, 2010 1:32 PM

## 2010-09-16 ENCOUNTER — Encounter: Payer: Self-pay | Admitting: Internal Medicine

## 2010-09-16 NOTE — Medication Information (Signed)
Summary: Coumadin Clinic  Anticoagulant Therapy  Managed by: Inactive Referring MD: Charlton Haws, MD PCP: [name], MD Supervising MD: [name] MD, Theron Arista Indication 1: Atrial Fibrillation Lab Used: [facility] [place] Site: [address] INR RANGE 2-3          Comments: Pt states Dr Clarene Duke in [place] follows coumadin   Allergies: 1)  ! * Propylthiouracial 2)  ! * Amiodarone  Anticoagulation Management History:      Positive risk factors for bleeding include an age of 74 years or older, history of GI bleeding, and presence of serious comorbidities.  The bleeding index is 'high risk'.  Positive CHADS2 values include History of CHF and History of HTN.  Negative CHADS2 values include Age > 74 years old.  Her last INR was 1.4 ratio.  Anticoagulation responsible provider: [name] MD, Theron Arista.  Exp: 05/2011.    Anticoagulation Management Assessment/Plan:      The patient's current anticoagulation dose is Warfarin sodium 2 mg tabs: Use as directed by Anticoagulation Clinic.  The target INR is 2.0-3.0.  The next INR is due 05/12/2010.  Anticoagulation instructions were given to patient.  Results were reviewed/authorized by Inactive.         Prior Anticoagulation Instructions: INR 3.0  Continue taking one tablet every day except one and one-half tablet on Monday and Friday.  We will see you in three weeks

## 2010-09-16 NOTE — Progress Notes (Signed)
Summary: lab reminder  Phone Note Outgoing Call Call back at Home Phone [phone]   Call placed by: Chales Abrahams CMA Duncan Dull),  September 06, 2010 8:25 AM Summary of Call: called and reminded pt to have labs Initial call taken by: Chales Abrahams CMA Duncan Dull),  September 06, 2010 8:25 AM

## 2010-09-16 NOTE — Progress Notes (Signed)
Summary: triage  Phone Note Call from Patient Call back at [phone]   Caller: Patient Call For: Dr Juanda Chance Reason for Call: Talk to Nurse Summary of Call: Patient wants to speak to nurse, thinks that she might have bleeding from the stomach again. Initial call taken by: Tawni Levy,  August 10, 2010 1:42 PM  Follow-up for Phone Call        Pt. says Dr.Coladonato ordered iron infusions  one for this week and one for next week and when she told Dr.Little he felt she should be re-evaluated  for her anemia.P. will call [facility] and have the records faxed over for review Pt. made aware that.Dr. Christella Hartigan is out of the office this week. Follow-up by: Teryl Lucy RN,  August 10, 2010 3:25 PM  Additional Follow-up for Phone Call Additional follow up Details #1::        Dr Christella Hartigan the records are on your desk for review. Additional Follow-up by: Chales Abrahams CMA Duncan Dull),  August 11, 2010 8:37 AM    Additional Follow-up for Phone Call Additional follow up Details #2::    I reviewed records:  TIBC level slightly low, otherwise iron tests (ferritin, serum iron, iron saturation) were all normal.  No CBC done.  please call her.  I'm not at all sure she is having problems with stomach bleeding.  she needs cbc checked this week Follow-up by: Rachael Fee MD,  August 16, 2010 10:58 AM  Additional Follow-up for Phone Call Additional follow up Details #3:: Details for Additional Follow-up Action Taken: left message on machine to call back Chales Abrahams CMA Duncan Dull)  August 17, 2010 8:19 AM   pt aware will come in this week

## 2010-09-22 NOTE — Letter (Signed)
Summary: [facility] Office Visit Note   [facility] Office Visit Note   Imported By: Roderic Ovens 09/14/2010 16:19:42  _____________________________________________________________________  External Attachment:    Type:   Image     Comment:   External Document

## 2010-10-05 DIAGNOSIS — I4891 Unspecified atrial fibrillation: Secondary | ICD-10-CM

## 2010-10-07 ENCOUNTER — Encounter: Payer: Self-pay | Admitting: Internal Medicine

## 2010-10-07 ENCOUNTER — Ambulatory Visit (INDEPENDENT_AMBULATORY_CARE_PROVIDER_SITE_OTHER): Payer: Medicare Other | Admitting: Internal Medicine

## 2010-10-07 DIAGNOSIS — I5022 Chronic systolic (congestive) heart failure: Secondary | ICD-10-CM

## 2010-10-07 DIAGNOSIS — I4891 Unspecified atrial fibrillation: Secondary | ICD-10-CM

## 2010-10-07 DIAGNOSIS — I10 Essential (primary) hypertension: Secondary | ICD-10-CM

## 2010-10-07 DIAGNOSIS — I495 Sick sinus syndrome: Secondary | ICD-10-CM

## 2010-10-12 NOTE — Assessment & Plan Note (Signed)
Summary: rov. per pt call.gd   Referring Provider:  na Primary Provider:  [name], MD   History of Present Illness: [name] returns today for followup.  She is a pleasant 76 yo woman with a h/o HTN, CRI, symptomatic bradycardia and heart block.  She is s/p BiV PM.  She has  occaisional palpitations.  No other complaints. She admits to being sedentary.  She notes that her PPM will occaisionally migrate when she lays on her right side.   Current Medications (verified): 1)  Lovastatin 10 Mg Tabs (Lovastatin) .... Take One Tablet By Mouth Daily At Bedtime 2)  Aspirin 81 Mg  Tbec (Aspirin) .... Take 1 By Mouth Qd 3)  Metoprolol Tartrate 50 Mg Tabs (Metoprolol Tartrate) .Marland Kitchen.. 1 Tablet  Two Times A Day 4)  Furosemide 80 Mg Tabs (Furosemide) .... Take One Tablet By Mouth Daily. 5)  Alprazolam 0.25 Mg  Tabs (Alprazolam) .... Take 1 By Mouth Three Times A Day Prn 6)  Warfarin Sodium 2 Mg Tabs (Warfarin Sodium) .... Use As Directed By Anticoagulation Clinic 7)  Protonix 40 Mg Tbec (Pantoprazole Sodium) .... Take 1 Tablet Daily 8)  Fluoxetine Hcl 20 Mg Caps (Fluoxetine Hcl) .Marland Kitchen.. 1 Tab By Mouth Once Daily 9)  Calcitriol 0.25 Mcg Caps (Calcitriol) .Marland Kitchen.. 1 Tab By Mouth Two Times A Day 10)  Chlordiazepoxide-Amitriptyline 5-12.5 Mg Tabs (Chlordiazepoxide-Amitriptyline) .Marland Kitchen.. 1 Tab By Mouth Once Daily 11)  Klor-Con 10 10 Meq Cr-Tabs (Potassium Chloride) .Marland Kitchen.. 1 Tab Two Times A Day 12)  Imodium A-D 2 Mg Tabs (Loperamide Hcl) .... One Tablet By Mouth Two Times A Day As Needed 13)  Mag-Ox 400 400 Mg Tabs (Magnesium Oxide) .... Two Times A Day 14)  Percocet 5-325 Mg Tabs (Oxycodone-Acetaminophen) .... 1/2  Tab Four Times A Day 15)  Ferrous Gluconate 324 (38 Fe) Mg Tabs (Ferrous Gluconate) .... One Tablet By Mouth Once Daily 16)  Sucralfate 1 Gm Tabs (Sucralfate) .... One Tablet By Mouth Three Times A Day 17)  Chlordiazepoxide-Amitriptyline 5-12.5 Mg Tabs (Chlordiazepoxide-Amitriptyline) .... At  Bedtime 18)  Amlodipine Besylate 5 Mg Tabs (Amlodipine Besylate) .... Take One Tablet By Mouth Daily  Allergies: 1)  ! * Propylthiouracial 2)  ! * Amiodarone  Past History:  Past Medical History: Last updated: 12/10/2009 PACEMAKER (ICD-V45.Marland Kitchen01) BRADYCARDIA (ICD-427.89) ANEMIA (ICD-285.9) HYPERPLASTIC GASTRIC POLYP 4/11 CELLULITIS (ICD-682.9) CAD, UNSPECIFIED SITE (ICD-414.00) EDEMA (ICD-782.3) HYPERTHYROIDISM (ICD-242.90) RENAL INSUFFICIENCY (ICD-588.9) HYPERTENSION (ICD-401.9) GASTROINTESTINAL HEMORRHAGE, HX OF (ICD-V12.79)-GASTRIC ULCER 2008 GERD (ICD-530.81) DEPRESSION (ICD-311) CONGESTIVE HEART FAILURE (ICD-428.0) ATRIAL FIBRILLATION (ICD-427.31) ANTICOAGULATION THERAPY (ICD-V58.61)   Past Surgical History: Last updated: 12/10/2009 Permanent pacemaker EGD (04/06/2007)/COLON 2/11/EGD 4/11 Percutaneous transluminal coronary angioplasty Stress Myoview 03/04/2005) Transthoracic Echocardiogram (02/27/2007)   Review of Systems       The patient complains of peripheral edema.  The patient denies chest pain, syncope, and dyspnea on exertion.    Vital Signs:  Patient profile:   76 year old female Height:      67 inches Weight:      183 pounds BMI:     28.77 Pulse rate:   68 / minute BP sitting:   130 / 80  Physical Exam  General:  Affect appropriate Healthy:  appears stated age HEENT: normal Neck supple with no adenopathy JVP normal no bruits no thyromegaly Lungs clear with no wheezing and good diaphragmatic motion Heart:  S1/S2 systolic murmur no ,rub, gallop or click PMI normal. Well healed PPM insertion. Abdomen: benighn, BS positve, no tenderness, no AAA no bruit.  No HSM or HJR Distal pulses intact with no bruits No edema Neuro non-focal Skin warm and dry    PPM Specifications Following MD:  Lewayne Bunting, MD     PPM Vendor:  [employer]     PPM Model Number:  [id number]     PPM Serial Number:  [id number] PPM DOI:  07/05/2007      Lead 1    Location: atrial      DOI: 02/09/2007     Model #: 5076     Serial #: [id number]     Status: active Lead 2    Location: RV     DOI: 02/09/2007     Model #: 9326     Serial #: [id number]     Status: active Lead 3    Location: LV     DOI: 07/05/2007     Model #: [id number]     Serial #: [id number]     Status: active   Indications:  CHF,tachy-brady syndrome   PPM Follow Up Pacer Dependent:  Yes      Episodes Coumadin:  No  Parameters Mode:  VVIR     Lower Rate Limit:  60     Upper Rate Limit:  130 MD Comments:  Normal device function.  Impression & Recommendations:  Problem # 1:  PACEMAKER, PERMANENT (ICD-V45.01) Her device is working normally. Will recheck in several months.  Problem # 2:  CONGESTIVE HEART FAILURE (ICD-428.0) Her symptoms are well controlled. Continue a low sodium diet and meds as below. Her updated medication list for this problem includes:    Aspirin 81 Mg Tbec (Aspirin) .Marland Kitchen... Take 1 by mouth qd    Metoprolol Tartrate 50 Mg Tabs (Metoprolol tartrate) .Marland Kitchen... 1 tablet  two times a day    Furosemide 80 Mg Tabs (Furosemide) .Marland Kitchen... Take one tablet by mouth daily.    Warfarin Sodium 2 Mg Tabs (Warfarin sodium) ..... Use as directed by anticoagulation clinic    Amlodipine Besylate 5 Mg Tabs (Amlodipine besylate) .Marland Kitchen... Take one tablet by mouth daily  Problem # 3:  ATRIAL FIBRILLATION (ICD-427.31) Her rates appear to be well controlled. Continue meds as below. Her updated medication list for this problem includes:    Aspirin 81 Mg Tbec (Aspirin) .Marland Kitchen... Take 1 by mouth qd    Metoprolol Tartrate 50 Mg Tabs (Metoprolol tartrate) .Marland Kitchen... 1 tablet  two times a day    Warfarin Sodium 2 Mg Tabs (Warfarin sodium) ..... Use as directed by anticoagulation clinic  Patient Instructions: 1)  Your physician wants you to follow-up in: 6 months with device clinic and 12 months with  Dr Court Joy will receive a reminder letter in the mail two months in advance. If you don't receive a letter, please call our office  to schedule the follow-up appointment. 2)  Your physician recommends that you continue on your current medications as directed. Please refer to the Current Medication list given to you today.

## 2010-10-26 NOTE — Letter (Signed)
Summary: [facility] Patient Note   [facility] Patient Note   Imported By: Roderic Ovens 10/20/2010 10:54:49  _____________________________________________________________________  External Attachment:    Type:   Image     Comment:   External Document

## 2010-10-26 NOTE — Cardiovascular Report (Signed)
Summary: Office Visit   Office Visit   Imported By: Roderic Ovens 10/20/2010 12:21:51  _____________________________________________________________________  External Attachment:    Type:   Image     Comment:   External Document

## 2010-11-21 LAB — IRON AND TIBC
Saturation Ratios: 13 % — ABNORMAL LOW (ref 20–55)
UIBC: 146 ug/dL

## 2010-11-21 LAB — COMPREHENSIVE METABOLIC PANEL
ALT: 18 U/L (ref 0–35)
Albumin: 2.4 g/dL — ABNORMAL LOW (ref 3.5–5.2)
Albumin: 3.9 g/dL (ref 3.5–5.2)
BUN: 37 mg/dL — ABNORMAL HIGH (ref 6–23)
Calcium: 7.9 mg/dL — ABNORMAL LOW (ref 8.4–10.5)
Calcium: 9.3 mg/dL (ref 8.4–10.5)
Creatinine, Ser: 2.26 mg/dL — ABNORMAL HIGH (ref 0.4–1.2)
GFR calc Af Amer: 13 mL/min — ABNORMAL LOW (ref >60)
Glucose, Bld: 132 mg/dL — ABNORMAL HIGH (ref 70–99)
Glucose, Bld: 99 mg/dL (ref 70–99)
Sodium: 134 mEq/L — ABNORMAL LOW (ref 135–145)
Total Protein: 5.5 g/dL — ABNORMAL LOW (ref 6.0–8.3)
Total Protein: 7.1 g/dL (ref 6.0–8.3)

## 2010-11-21 LAB — CBC
HCT: 28.4 % — ABNORMAL LOW (ref 36.0–46.0)
HCT: 29.7 % — ABNORMAL LOW (ref 36.0–46.0)
HCT: 29.8 % — ABNORMAL LOW (ref 36.0–46.0)
HCT: 42.1 % (ref 36.0–46.0)
Hemoglobin: 10 g/dL — ABNORMAL LOW (ref 12.0–15.0)
Hemoglobin: 10.2 g/dL — ABNORMAL LOW (ref 12.0–15.0)
Hemoglobin: 10.3 g/dL — ABNORMAL LOW (ref 12.0–15.0)
Hemoglobin: 11.2 g/dL — ABNORMAL LOW (ref 12.0–15.0)
Hemoglobin: 9.3 g/dL — ABNORMAL LOW (ref 12.0–15.0)
Hemoglobin: 9.8 g/dL — ABNORMAL LOW (ref 12.0–15.0)
MCHC: 34.4 g/dL (ref 30.0–36.0)
MCHC: 34.7 g/dL (ref 30.0–36.0)
MCHC: 34.8 g/dL (ref 30.0–36.0)
MCHC: 34.7 g/dL (ref 30.0–36.0)
MCV: 91.1 fL (ref 78.0–100.0)
MCV: 91.2 fL (ref 78.0–100.0)
MCV: 91.1 fL (ref 78.0–100.0)
Platelets: 133 10*3/uL — ABNORMAL LOW (ref 150–400)
Platelets: 158 10*3/uL (ref 150–400)
RBC: 2.94 MIL/uL — ABNORMAL LOW (ref 3.87–5.11)
RBC: 3.09 MIL/uL — ABNORMAL LOW (ref 3.87–5.11)
RBC: 3.26 MIL/uL — ABNORMAL LOW (ref 3.87–5.11)
RBC: 3.57 MIL/uL — ABNORMAL LOW (ref 3.87–5.11)
RDW: 13.4 % (ref 11.5–15.5)
RDW: 13.4 % (ref 11.5–15.5)
RDW: 13.8 % (ref 11.5–15.5)
RDW: 14 % (ref 11.5–15.5)
RDW: 14.2 % (ref 11.5–15.5)
RDW: 13.7 % (ref 11.5–15.5)
WBC: 7.9 10*3/uL (ref 4.0–10.5)

## 2010-11-21 LAB — BASIC METABOLIC PANEL
BUN: 59 mg/dL — ABNORMAL HIGH (ref 6–23)
CO2: 21 mEq/L (ref 19–32)
CO2: 22 mEq/L (ref 19–32)
Calcium: 7.8 mg/dL — ABNORMAL LOW (ref 8.4–10.5)
Calcium: 8.2 mg/dL — ABNORMAL LOW (ref 8.4–10.5)
Chloride: 103 mEq/L (ref 96–112)
Creatinine, Ser: 3.71 mg/dL — ABNORMAL HIGH (ref 0.4–1.2)
GFR calc Af Amer: 20 mL/min — ABNORMAL LOW (ref >60)
GFR calc Af Amer: 24 mL/min — ABNORMAL LOW (ref >60)
GFR calc non Af Amer: 12 mL/min — ABNORMAL LOW (ref >60)
GFR calc non Af Amer: 20 mL/min — ABNORMAL LOW (ref >60)
Glucose, Bld: 135 mg/dL — ABNORMAL HIGH (ref 70–99)
Glucose, Bld: 137 mg/dL — ABNORMAL HIGH (ref 70–99)
Potassium: 3.9 mEq/L (ref 3.5–5.1)
Sodium: 136 mEq/L (ref 135–145)
Sodium: 137 mEq/L (ref 135–145)
Sodium: 139 mEq/L (ref 135–145)

## 2010-11-21 LAB — URINE CULTURE
Colony Count: NO GROWTH
Culture: NO GROWTH

## 2010-11-21 LAB — CREATININE, URINE, RANDOM: Creatinine, Urine: 141.4 mg/dL

## 2010-11-21 LAB — CROSSMATCH: ABO/RH(D): O POS

## 2010-11-21 LAB — URINALYSIS, ROUTINE W REFLEX MICROSCOPIC
Ketones, ur: NEGATIVE mg/dL
Nitrite: NEGATIVE
Specific Gravity, Urine: 1.01 (ref 1.005–1.030)
pH: 6 (ref 5.0–8.0)

## 2010-11-21 LAB — APTT: aPTT: 37 seconds (ref 24–37)

## 2010-11-21 LAB — DIFFERENTIAL
Lymphocytes Relative: 21 % (ref 12–46)
Monocytes Absolute: 0.4 10*3/uL (ref 0.1–1.0)
Monocytes Relative: 7 % (ref 3–12)
Neutro Abs: 4.2 10*3/uL (ref 1.7–7.7)
Neutrophils Relative %: 70 % (ref 43–77)

## 2010-11-21 LAB — PHOSPHORUS: Phosphorus: 3.6 mg/dL (ref 2.3–4.6)

## 2010-11-21 LAB — TYPE AND SCREEN: Antibody Screen: NEGATIVE

## 2010-11-21 LAB — URINALYSIS, MICROSCOPIC ONLY
Glucose, UA: NEGATIVE mg/dL
Protein, ur: NEGATIVE mg/dL
Urobilinogen, UA: 0.2 mg/dL (ref 0.0–1.0)

## 2010-11-21 LAB — MAGNESIUM: Magnesium: 2.1 mg/dL (ref 1.5–2.5)

## 2010-11-21 LAB — PROTIME-INR
INR: 1.1 (ref 0.00–1.49)
INR: 1 (ref 0.00–1.49)

## 2010-12-20 ENCOUNTER — Telehealth: Payer: Self-pay | Admitting: Cardiovascular Disease

## 2010-12-20 NOTE — Telephone Encounter (Signed)
Pt wants to talk about appt she just made and her coumadin being out of wack

## 2010-12-21 NOTE — Telephone Encounter (Signed)
SPOKE WITH PT HAS BEEN HAVING CHEST PAIN OVER THE LAST 2 WEEKS  HAS TAKEN 4 NTG  IN THAT TIME PERIOD WITH RELIEF  ALSO RAN OUT OF COUMADIN D/T PMD  NOT IN OFFICE PER PT  RESTARTED COUMADIN LAST WEEK  PT STATES  PMD INSTRUCTED HER TO SEE CARDIOLOGISTS AND HAVE COUMADIN CHECKED  HAS APPT ON 01/06/11 WITH DR [name]  OFFERED AN APPT WITH [facility] FOR POSSIBLE EARLIER APPT PT DECLINED AT THIS TIME INSTRUCTED IF S/S INCREASE OR HAS TO TAKE 3 NTG WITH NO RELIEF THAN NEEDS TO  GO TO NEAREST ER.PT VERBALIZED UNDERSTANDING./CY

## 2010-12-23 ENCOUNTER — Encounter: Payer: Medicare Other | Admitting: *Deleted

## 2010-12-28 ENCOUNTER — Encounter: Payer: Medicare Other | Admitting: *Deleted

## 2010-12-28 NOTE — Assessment & Plan Note (Signed)
[facility]                            CARDIOLOGY OFFICE NOTE   [name], [name]                    MRN:          [id number]  DATE:08/15/2007                            DOB:          Oct 15, 1934    [name] returns today for follow-up.  She just had her pacemaker upgraded  to a BiV AICD.  Unfortunately, [name] has really been going downhill in  regards to her heart.  She has had atrial arrhythmias.  We have been  unable to maintain sinus rhythm with her.  She has failed cardioversions  x3 and is intolerant to amiodarone.  She has had an AV nodal ablation.  She has had recurrent atrial fibrillation as well as left-sided flutter.  She has seen Dr. Ladona Ridgel for this.   She has had problems taking her Coumadin with the GI bleeds, and she has  been falling a lot.  We currently do not have her on Coumadin.  I am  somewhat concerned about this since there is a question of  hyperthyroidism, and she may have had a reaction to PTU sodas  incompletely treated.  Apparently, her primary care doctor is to give  her a dose of iodine.  I am not sure if this is in regard to treatment  with radioactive iodine or diagnostic test.   Her rash has now cleared up.  She was in the hospital at the end of  November for her BiV AICD upgrade.  Despite having good LV function, she  does have MR and has had diastolic heart failure which is functional  class III.   In talking to [name], her heart actually seems stable.  She is not having  PND, orthopnea.  She is not having any significant shortness of breath  after her BiV AICD.  Her creatinine actually improved somewhat into the  1.8 range from 2.3, indicating improved cardiac output.   Her review of systems, however, is remarkable for a lot of problems with  ambulation.  She has difficulty lifting her legs.  She was recently in  the emergency room after falling and required stitches to her head.  Between her previous GI bleed,  instability on her feet, and multiple  medical problems, we have her off of warfarin currently.   I am concerned about her inability to walk.  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  I do not know workup has been done, but  she certainly needs this looked into more aggressively.   Her current medication include:  1. Lisinopril 40 a day.  2. Actonel 35 mg a week/  3. Klor-Con 20 b.i.d.  4. Simvastatin to be stopped in light of her leg weakness.  5. Aspirin a day.  6. Metoprolol 50 b.i.d.  7. Lasix 40 a day.  8. Protonix 40 a day.   Her exam is remarkable for a chronically ill-appearing elderly female in  no distress.  Weight is 171, blood pressure is 150/80, pulse is 66 and  regular with probable pacing and underlying atrial flutter, respiratory  rate 16.  HEENT:  Unremarkable.  Carotids normal without  bruit.  No lymphadenopathy, thyromegaly, JVP  elevation.  No goiter.  LUNGS:  Were clear.  Good diaphragmatic motion.  No wheezing.  AICD site  is well-healed.  S1-S2 with an MR murmur.  PMI is not increased or  displaced.  ABDOMEN:  Benign.  Bowel sounds positive.  No hepatosplenomegaly.  No  hepatojugular reflux.  Distal pulses are intact.  There is no evidence of vascular  insufficiency.  No lower extremity edema.  Her skin has multiple ecchymoses on it.  She has a residual bump on the  right occipital area from her fall.  MUSCULAR EXAM:  Is remarkable for significant limb-girdle weakness.  She  has a very hard time lifting her legs up.  She really cannot oppose her  knees to her chest.  There is no other focal neurological deficit.  She just seems very  stiff.   IMPRESSION:  1. Chronic atrial fibrillation/flutter status post ablation with BiV      AICD, currently stable.  Continue current dose of metoprolol 50      b.i.d., currently off Coumadin until her GI issues and instability      with ambulation issues are addressed.  2. Hypertension, currently  reasonably controlled.  Continue lisinopril      40 a day.  3. Hypercholesterolemia.  She does not have marked coronary disease,      and we will stop her simvastatin and check a CPK to make sure that      this is not related to her leg weakness.  4. Question limb-girdle disease.  Neurology consult will be needed.      Will do a CT scan to rule out spinal stenosis and assess her pelvic      anatomy.  We will also check some blood work in regards to a sed      rate and a rheumatoid factor, CPK.  5. Question hyperthyroidism in the setting of drug allergy to PTU.      Check TSH and T4.  This may be related her myopathy as well.      Follow-up with Dr. Clarene Duke.  6. Mitral insufficiency may be more related to her class III heart      failure symptoms than anything else.  Not an operative candidate.      Continue current dose of Lasix 40 a day.  Follow-up echo in about 2      months now that she has a BiV AICD.  7. Osteoporosis.  Continue Actonel 35 mg a week.   We will try to get the PT and OT arrange for [name] at home.  She clearly  needs physical therapy to keep her legs limber.  We will see what the  neurologists have to think and what her CT scans show in regards to  etiology.  She will need a walker and not just a cane.   Again, given her history of GI bleeding and recent fall lab, I prefer to  have her off Coumadin despite her high risk for stroke.     [name]. [name], MD, [facility]  Electronically Signed    PCN/MedQ  DD: 08/15/2007  DT: 08/15/2007  Job #: [id number]

## 2010-12-28 NOTE — Assessment & Plan Note (Signed)
[facility]                         ELECTROPHYSIOLOGY OFFICE NOTE   SASHAY, FELLING                    MRN:          [id number]  DATE:09/10/2007                            DOB:          April 11, 1935    Ms. Stcharles returns today for follow-up.  She is a very pleasant  elderly woman with a history of recent ICD upgrade, who returns today  with concerns about her ICD site and her device being loose, also with  elevation in her blood pressure.  The patient otherwise had no specific  complaints today.  She was noted to have improvement in her serum  creatinine after her bi-V ICD insertion.  Also of note, since she  underwent biopsy upgrade her blood pressure has been more elevated than  usual despite not missing any of her blood pressure medications.   Ms. Luellen's medicines today include:  1. Lisinopril 40 mg a day.  2. Actonel 35 mg weekly.  3. Klor-Con 20 mEq b.i.d.  4. Simvastatin 40 mg daily.  5. Aspirin 81 mg a day.  6. Metoprolol 50 mg twice daily.  7. Furosemide 1-1/2 tablets of 40 mg daily.  8. Pantoprazole 40 mg a day.  9. Iron supplements.   PHYSICAL EXAM:  She is a pleasant elderly woman in no acute distress.  The blood pressure was 190/96 with a pulse of 76 and regular,  respirations were 18.  The weight was 177 pounds.  NECK:  No jugular distention.  LUNGS:  Clear bilaterally to auscultation.  No wheezes, rales or rhonchi  were present.  CARDIOVASCULAR:  Regular rate and rhythm with normal S1 and S2.  There  was a soft S4 gallop.  EXTREMITIES:  No cyanosis, clubbing or edema.   IMPRESSION:  1. Uncontrolled hypertension.  2. Status post implantable cardioverter-defibrillator implantation      (biventricular).   DISCUSSION:  Ms. Mullet is stable.  Her exam demonstrates no problems  with her healing at her pacemaker/defibrillator insertion site.  She is  quite hypertensive today and besides maintaining a low-sodium  diet, I  have recommended that she increase her dose of metoprolol to 75 mg twice  daily.  Will see her back for follow-up in several weeks to see how her  blood pressure is doing.    Doylene Canning. Ladona Ridgel, MD  Electronically Signed   GWT/MedQ  DD: 09/10/2007  DT: 09/11/2007  Job #: [id number]

## 2010-12-28 NOTE — Discharge Summary (Signed)
[name], [name]             ACCOUNT NO.:  [id number]   MEDICAL RECORD NO.:  [id number]          PATIENT TYPE:  INP   LOCATION:  6733                         FACILITY:  [facility]   PHYSICIAN:  Peter C. [name], MD, FACCDATE OF BIRTH:  Jan 18, 1935   DATE OF ADMISSION:  02/19/2008  DATE OF DISCHARGE:  02/26/2008                               DISCHARGE SUMMARY   PRIMARY CARDIOLOGIST:  Theron Arista C. [name], MD, [facility]   PRIMARY CARE PHYSICIAN:  Terrial Rhodes, MD   PROCEDURES PERFORMED DURING HOSPITALIZATION:  1. Cardiac catheterization completed by Dr. Bonnee Quin on July 8, 90%      proximal LAD stenosis.  2. Status post drug-eluting stent 2.5 x 13-mm Cypher stent to the      proximal LAD, reducing it from 95% to 0%.   FINAL DISCHARGE DIAGNOSES:  1. Coronary artery disease.      a.     Status post cardiac catheterization revealing multivessel       disease with prominent 95% lesion in the proximal left anterior       descending.      b.     Status post drug-eluting stent to the proximal left anterior       descending using a Cypher stent on February 22, 2008.  2. Chronic renal insufficiency.  3. Iron-deficiency anemia.  4. History of gastrointestinal bleed on Coumadin.  5. History of atrial flutter, not a Coumadin candidate.  6. Previous atrioventricular nodal ablation with pacemaker.  7. History of diastolic heart failure and normal left ventricular      function.  8. Postcatheterization hematoma without pseudoaneurysm.  9. Hyperthyroidism with allergic reaction to PTU.   HOSPITAL COURSE:  This is a 76 year old female patient of Dr. Lewayne Bunting and Dr. Charlton Haws, who was admitted to the emergency room on  February 19, 2008, with complaints of chest pain.  The patient had a history  of CAD with a previous stent to the OM and the last catheterization in  2008 of March showed stent to be patent.  The patient did have a history  of an ostial septal perforator lesion which was not felt to  be  clinically significant.  Over the 2 weeks prior to admission, the  patient had angina symptoms and exertional shortness of breath and  aching in her chest with radiation to the arm and shoulder.  Some relief  with sublingual nitroglycerin.  The patient was admitted, placed on IV  heparin and nitroglycerin patch, and scheduled for cardiac  catheterization.   The patient did undergo cardiac catheterization per Dr. Bonnee Quin.  Please see Dr. Rosalyn Charters thorough cardiac catheterization note for more  details.  It was found that she had a 95% proximal LAD lesion and felt  to be a good candidate for PCI of that lesion.  In the interim, the  patient remained on Plavix, aspirin, and heparin drip, and underwent PCI  on February 22, 2008, using a Cypher drug-eluting stent.  She had some  oozing at her sheath site and a small hematoma noted with hypertension  postprocedure.  The patient was  monitored very closely concerning this  over the next several days.  There was no evidence of pseudoaneurysm or  bleed; however, the patient did have a good bit of ecchymosis noted.  It  was also noted that the patient's BUN and creatinine was rising  throughout hospitalization and the patient was started on some mild  hydration.  Her lisinopril was discontinued and Lasix was discontinued  at that time.  The patient was also found to have some mild anemia and  had a history of iron-deficiency anemia and was restarted on p.o.  ferrous sulfate.  The patient had no signs of active bleeding.  The  patient stabilized on day of discharge with BUN and creatinine being  monitored very closely off of Lasix and lisinopril.  It was also advised  that the patient not be on Coumadin secondary to the hematoma and  ecchymosis.  On day of discharge, the patient's creatinine was 1.52, her  hematocrit was 30.4.  The patient did not receive any blood transfusions  during hospitalization.  On day of discharge, the patient was seen  and  examined by Dr. Charlton Haws and found to be stable.  The patient's  blood pressure was 160/80.  The patient continued to have a large amount  of ecchymosis at the right groin site.  There was no pain.  There was no  active bleeding seen.  The patient will be discharged on Plavix and  aspirin along with iron and Protonix.  The patient has been advised not  to be on any Coumadin.  She will follow up with Dr. [name] in 2 weeks to  readdress the need for Coumadin or not.  In the interim, the patient  will have a BMET drawn approximately 5 days to evaluate kidney status  and will be placed back on Norvasc and Lasix prior to discharge, but  continue to hold lisinopril.   DISCHARGE LABORATORY DATA:  Sodium 142, potassium 4.1, chloride 113, CO2  25, glucose 95, BUN 27, creatinine 1.57, hematocrit 30.4, PT on  admission 12.3, INR 0.9.  Chest x-ray dated July 7 no acute  cardiopulmonary process.  Echocardiogram completed on July 8 overall  left ventricular systolic function normal with an EF of 60%, aortic  valve mildly calcified, mild mitral valve regurg, but atrium moderately  dilated.  There was appearance of the catheter or pacing wire in the  right ventricle.  Right atrium was moderately dilated.   Discussion of discharge medications was had with Dr. Olga Millers,  doctor of the day on February 26, 2008, as Dr. [name] was unavailable to  discuss discharge meds.   DISCHARGE MEDICATIONS:  1. Metoprolol 50 mg twice a day.  2. Simvastatin 40 mg at bedtime.  3. Plavix 75 mg daily.  4. Aspirin 325 daily.  5. Protonix 40 mg daily.  6. Iron, ferrous sulfate 325 mg daily.  7. Nitroglycerin 0.4 mg p.r.n.  8. Norvasc 5 mg daily.  9. Lasix 60 mg daily.  10.The patient has been advised to hold lisinopril and Coumadin until      seen by Dr. [name].   FOLLOW-UP PLANS AND APPOINTMENT:  1. The patient is scheduled for a BMET to evaluate renal function on      Tuesday, July 20 at 8:30 a.m.  2.  The patient is to follow up with Dr. Charlton Haws on July 27 at      12:15 p.m.  3. The patient is to follow up with Dr. Arrie Aran, primary  care      physician for continued medical management.  4. The patient has been advised to contact our office for evidence for      any questions or recurrence of chest pain.  5. The patient has been advised to wash the right groin site for      evidence of swelling, recurrence of hematoma, or frank bleeding.   Time spent with the patient to include physician's time 45 minutes.      Bettey Mare. Lyman Bishop, NP      [name]. [name], MD, [facility]  Electronically Signed    KML/MEDQ  D:  02/26/2008  T:  02/26/2008  Job:  [id number]   cc:   Terrial Rhodes, M.D.

## 2010-12-28 NOTE — Consult Note (Signed)
[name], [name]             ACCOUNT NO.:  [id number]   MEDICAL RECORD NO.:  [id number]          PATIENT TYPE:  INP   LOCATION:  2015                         FACILITY:  [facility]   PHYSICIAN:  Terrial Rhodes, M.D.DATE OF BIRTH:  1935/05/12   DATE OF CONSULTATION:  03/01/2007  DATE OF DISCHARGE:                                 CONSULTATION   REASON FOR CONSULTATION:  Acute on chronic renal failure.   CONSULTING PHYSICIAN:  [name]. [name], M.D.   HISTORY OF PRESENT ILLNESS:  [name] is a 76 year old Caucasian  female with past medical history significant for hypertension,  polycystic kidney disease, chronic kidney disease stage 3 to 4 with  baseline creatinine ranging from 1.9-2.2 and a recent diagnosis of  atrial fibrillation with rapid ventricular rate and sick sinus syndrome  status post pacemaker on February 09, 2007 who was admitted on February 26, 2007  with increasing shortness of breath and fatigue.  The patient had been  hospitalized at the end of June and early July with atrial fibrillation,  diastolic heart failure, pacemaker placement and then ablation on February 13, 2007.  She was then seen by her cardiologist when she was complaining  of increasing fatigue and shortness of breath.  She had also felt poorly  for the last several days prior to admission with dyspnea on exertion,  orthopnea.  She has also been very nauseous since her hospital admission  and has been vomiting all day yesterday and unable to keep clear fluids  down or foods.  She still has nausea but no vomiting today.  Of note,  she had been on an ACE inhibitor and lisinopril on discharge  in early July and was taking up through her time prior to admission, but  since she was admitted, it has been on hold.  She also has been  receiving Lasix IV 40 mg twice a day.  The patient denies any intake of  nonsteroidals or Cox-2 inhibitors and otherwise doing well.   He has no known drug allergies.   PAST  MEDICAL HISTORY:  1. Atrial fibrillation status post cardioversion on January 22, 2007 and      ablation on February 13, 2007.  2. Sick sinus syndrome status post pacemaker placement on June 27.  3. Diastolic heart failure, most recent echocardiogram with an EF of      50%.  4. Coronary artery disease.  5. Hypertension.  6. Chronic kidney disease, stage 3 to 4, presumably due to polycystic      kidney disease with a baseline creatinine of 1.90-0.2 with multiple      episodes of acute on chronic renal failure.  7. Gastroesophageal reflux disease.  8. GI bleed 16 from ulcers on June 2008 after she was placed on      Coumadin therapy  9. Depression.  10.Status post cholecystectomy.  11.Status post hysterectomy.  12.Status post appendectomy.  13.Hypertension.  14.Anemia.   CURRENT MEDICATIONS:  1. Potassium chloride 40 mEq a day.  2. Protonix 40 mg b.i.d.  3. Lasix 40 mg IV b.i.d.  4. Metoprolol 50 mg b.i.d.  5.  Imdur 30 mg a day.  6. Aspirin 81 mg a day.  7. Coumadin sliding scale.  8. Amiodarone 400 mg b.i.d.  9. Heparin drip.  10.Xanax p.r.n.  11.Ambien p.r.n.  12.Tylenol p.r.n.  13.Robitussin p.r.n.  14.Zofran p.r.n.  15.Imodium p.r.n.  16.Maalox p.r.n.  17.Milk of magnesia p.r.n.   FAMILY HISTORY:  Mother died in her 62s from a perforated colon.  She  also had polycystic kidney disease but was not on dialysis.  She has  family history strong with polycystic kidney disease on the maternal  side.  Her brother is hearing dialysis and is followed up in our office.   SOCIAL HISTORY:  She lives alone.  She has 2 sons.  She quit tobacco on  January 17, 2007 at the time of her last admission.  No alcohol.  No drug  use.   REVIEW OF SYSTEMS:  In general, the patient has had some nausea,  decreased appetite and increasing fatigue.  HEENT:  No tinnitus,  dysphagia, odynophagia, vertigo, epistaxis.  CARDIAC:  She is having  some chest pain associated with cough, some shortness of breath,  dyspnea  on exertion, two pillow orthopnea.  PULMONARY:  She has had a cough that  has been nonproductive, hemoptysis.  GASTROINTESTINAL:  She had nausea  and vomiting all day yesterday.  She has nausea today but no vomiting.  GENITOURINARY:  No dysuria, pyuria, hematuria, urgency, frequency,  retention.  NEUROLOGIC:  She does occasionally have some low back pain  as well as epigastric pain and suprapubic pain intermittently.  NEUROLOGIC:  No rashes, lumps or bumps.  HEMATOLOGIC:  No abnormal  bleeding or bruising.  All other systems negative.   PHYSICAL EXAMINATION:  Well-developed, well-nourished female in no  apparent distress.  Temperature is 97, pulse 81, blood pressure is  128/80, respiratory rate 20.  HEENT EXAM:  Normocephalic, atraumatic.  Pupils equal, round, react to  light.  Extraocular movements intact.  No icterus.  Oropharynx without  lesions.  NECK:  Supple with full range of motion.  No lymphadenopathy or bruits.  LUNGS:  Have minimal crackles at the bases that cleared with coughing.  No dullness to percussion.  CARDIAC EXAM:  Is irregularly irregular.  No precordial rub appreciated.  ABDOMINAL EXAM:  Normoactive bowel sounds, soft, nontender, no guarding  or rebound.  No bruits appreciated.  EXTREMITIES:  No clubbing, cyanosis or edema.  NEUROLOGIC EXAM:  Was grossly intact.   LABORATORY DATA:  White blood cell count 4.5, hemoglobin 10.5, platelets  203.  Sodium 132, potassium 3.6, chloride 99, CO2 of 22, BUN 38,  creatinine 2.48, glucose 90.   ASSESSMENT/PLAN:  1. Acute on chronic renal failure.  The patient's creatinine rose from      2 at time of admission to 2.5 today.  However, this is after she      has been vomiting for the last 36 hours and has not been able to      keep anything down p.o. and still getting Lasix IV.  This is likely      prerenal azotemia, due to volume contraction and ATN.  She has also      been off of her ACE inhibitor since admission.   Will continue to      hold this and will decrease her Lasix to once a day, hold the p.m.      dose and follow her renal function closely.  2. Chronic kidney disease, stage 3 to 4 secondary to  polycystic kidney      disease.  Even with a creatinine of 2, her GFR is 125 mL per minute      and is likely related to her polycystic kidney disease.  At this      time, we will obtain an ultrasound to evaluate size and number of      kidneys as well as the cysts.  She also has a history of kidney      stones and will also evaluate for presence of stones.  Also educate      the patient on the severity of her kidney disease, and she will      need to follow closely in our office in preparation for dialysis as      her brother is now doing.  We will have her watch educational tapes      about dialysis, both hemo and peritoneal.  3. Congestive heart failure.  EF was 50%.  She responded to Lasix.      Will decrease the dose to once a day and continue to follow.  4. Atrial fibrillation.  She is due for cardioversion in the morning.      We will continue to follow.  5. Nausea and vomiting.  Unclear etiology but likely related to      amiodarone.  Also in the differential is kidney stones or urinary      tract infection.  We will order ultrasound as above as well as      urinalysis and culture and sensitivity.  Also check LFTs because of      amiodarone therapy and check an amylase and lipase.  6. Anemia.  The patient does have a history of GI bleed and chronic      kidney disease.  Will follow her hemoglobin.  If it remains below      11 and her iron stores are adequate, we may need to initiate EPO      therapy.  7. Peptic ulcer disease on Protonix.  8. Coronary artery disease.  Last catheterization was in March 2008.      She had negative renal artery stenosis, and she is otherwise      stable.  9. Secondary hyperparathyroidism.  Will check calcium, phosphorous,      anti-PTH levels.   Thank you  for this consultation.           ______________________________  Terrial Rhodes, M.D.     JC/MEDQ  D:  03/01/2007  T:  03/02/2007  Job:  [id number]

## 2010-12-28 NOTE — H&P (Signed)
NAMEDINIA, [name]             ACCOUNT NO.:  [id number]   MEDICAL RECORD NO.:  [id number]          PATIENT TYPE:  INP   LOCATION:  4732                         FACILITY:  [facility]   PHYSICIAN:  Peter C. [name], MD, FACCDATE OF BIRTH:  05-Jun-1935   DATE OF ADMISSION:  02/05/2007  DATE OF DISCHARGE:  02/14/2007                              HISTORY & PHYSICAL   HISTORY OF PRESENT ILLNESS:  [name] is a 76 year old patient who  was just admitted to the hospital from February 05, 2007 to February 14, 2007.   She has a history of coronary artery disease with stent to the OM in  2006.  Her catheterization in March of 2008 showed it to be widely  patent.  She has hypertension.  She has had diastolic heart failure.  She has had recurrent atrial fibrillation.  She is status post two  previous cardioversions.  She has not been on antiarrhythmic's.  She was  admitted to the hospital on February 05, 2007 coming in with a significant  bleed on supratherapeutic Coumadin.  It is not clear why her INR shot up  so high.  Her hemoglobin dropped to 6.0.  She subsequently was found to  have, I believe,  duodenal and gastric ulcers.  Her aspirin, Plavix and  Coumadin were held.  While in the hospital her atrial fibrillation was  difficult to control.  She was on dig and three times a day Lopressor.  She subsequently underwent pacemaker therapy with an ablation.  She was  not cardioverted due to the lack of anticoagulation.  She came to the  office today with increasing shortness of breath.  It appeared that she  was in congestive heart failure.  She had increasing lower extremity  edema, PND and orthopnea.  She did not notice any change in her stools.  She has been off her blood thinners and I don't think she has bled  again.  [name] was sick enough to need admission to the hospital.   REVIEW OF SYSTEMS:  Remarkable for no significant coughing, no chest  pain.  She has had significant PND and orthopnea.  She has  had a weight  gain.  She started back on her Lasix on her own.   She has noted palpitations.   PAST MEDICAL HISTORY:  Is as noted.  She has sick sinus syndrome with  PAF. She has had atrial fibrillation, coronary artery disease with  previous stenting to the OM, pacemaker and ablation placement, anemia  with GI bleed secondary to ulcers in the setting of nonsteroidal use for  arthritis. Hypertension.  She has had minor chronic renal insufficiency  with a creatinine of 2.0.  She is a previous smoker.  There is a history  of depression and GERD.   PAST SURGICAL HISTORY:  Includes cholecystectomy, hysterectomy and  appendectomy.   MEDICATIONS:  Her medications normally include:  1. Aspirin and Plavix, which have been held.  2. Coumadin, which has also been held.  3. Klor-Con 40 mEq a day.  4. Zantac 150 mg daily.  5. Lopressor 50 mg t.i.d.  6. Plavix  75 mg a day.  7. An aspirin a day.  8. Actonel.  9. Xanax p.r.n.  10.Imdur 30 mg daily.  11.Lexapro 10 mg daily.  12.Lisinopril 40 mg daily.  13.Lasix 20 mg a day.   ALLERGIES:  She has no known drug allergies.   FAMILY HISTORY:  Noncontributory.   SOCIAL HISTORY:  She is widowed.  She has two sons that I have met.  One  was with her today.  They are very supportive.  Up until her recent  illnesses she had been fairly active and continues to drive.  She does  not currently smoke or drink.  She is a previous smoker.   PHYSICAL EXAMINATION:  VITAL SIGNS:  Her exam is currently remarkable  for being V-paced in atrial fibrillation at a rate of 90.  Her blood  pressure is 120/70.  Her respiratory rate is 20 with some labored  breathing.  She was not weighed.  She is afebrile.  HEENT:  Normal.  NECK:  JVP is mildly elevated.  There are no carotid bruits.  There is  no lymphadenopathy and no thyromegaly.  LUNGS:  Have bilateral rales about quarter to a half way up.  HEART:  There is an S1 and S2 with normal heart sounds.  There is  no  murmur.  PMI is normal.  ABDOMEN:  Protuberant.  Bowel sounds are positive.  There is no  tenderness.  There is some ecchymoses from Lovenox shots.  There is no  abdominal aortic aneurysm.  No epigastric pain.  EXTREMITIES:  Femoral pulses are +2.  There is +2 lower extremity edema  bilaterally.  NEUROLOGICAL:  Nonfocal.  There is no muscular weakness.   CLINICAL DATA:  EKG shows V-pacing.  We interrogated her pacemaker and  it appears she has been in atrial fibrillation since February 14, 2007.  As I  recall she was not cardioverted due to the lack of anticoagulation.   IMPRESSION:  1. Probable continued diastolic dysfunction.  She needs to be      hospitalized.  We will put her on telemetry, put her on 3L of O2      and monitor her rates.  We will decrease her atrial fibrillation      tracking rate to 80.  I will have Dr. Ladona Ridgel reassess her.  We will      place her back on Lopressor 50 b.i.d.  In regards to possible      antiarrhythmic's she does have a history of renal failure with a      creatinine of 2.0.  She does not have marked left ventricular      hypertrophy and she does have coronary disease which is clearly      stable with a recent catheterization in March showing widely patent      stents.  I suspect our choices would include amiodarone and      Betapace.  I am less inclined to use Tikosyn with the renal issues.      I will ask Dr. Ladona Ridgel to help make this decision, but I think she      clearly needs an antiarrhythmic as part of her hospitalization.  2. Anticoagulation.  I doubt that she has bled so as long as her      hemoglobin is above 10 we will restart her heparin and Coumadin and      hope that she does not bleed again.  I suspect that nonsteroidal's,  aspirin and Plavix may be more of a culprit in regards to her      bleeding.  3. Congestive heart failure, probably diastolic.  Rate control and      cardioversion will help.  We will place her on Lasix 40 mg  IV      b.i.d.  Will continue to monitor her I's and O's and weights.  She      will have a STAT chest x-ray when she arrives to confirm the      diagnosis.  4. Coronary artery disease is currently stable, not having angina.      Unfortunately, I think she will have to be off      Plavix and possibly aspirin as well given her gastrointestinal      bleeding and needs to be on Coumadin.  5. As I recall she had a bare metal stent placed back in 2006.  6. We will continue her oral nitrates along as her blood pressure      tolerates this.      [name]. [name], MD, [facility]  Electronically Signed     PCN/MEDQ  D:  02/26/2007  T:  02/26/2007  Job:  [id number]

## 2010-12-28 NOTE — Cardiovascular Report (Signed)
[name], [name]             ACCOUNT NO.:  [id number]   MEDICAL RECORD NO.:  [id number]          PATIENT TYPE:  INP   LOCATION:  6733                         FACILITY:  [facility]   PHYSICIAN:  Arturo Morton. Riley Kill, MD, FACCDATE OF BIRTH:  1935/08/14   DATE OF PROCEDURE:  02/20/2008  DATE OF DISCHARGE:  02/26/2008                            CARDIAC CATHETERIZATION   INDICATIONS:  This patient is a 76 year old patient of Dr. [name], who  has had some recurrent chest pain.  The current study was done to assess  coronary anatomy.  Because of contrast issues, no ventriculogram was to  be performed.  We did do left heart pressures.   DESCRIPTION OF PROCEDURE:  The patient was brought to the cath lab and  prepped and draped in usual fashion.  Through the puncture, the femoral  artery was entered and a 5-French sheath was paced placed.  Views of  left and right coronaries were obtained with total of 22 mL of contrast.  No ventriculography was performed.  She was taken to the holding area in  satisfactory clinical condition.   HEMODYNAMIC DATA:  1. Central aortic pressure was 188/83.  2. Left ventricular pressure 175/60.  3. There was no gradient or pullback across aortic valve.   ANGIOGRAPHIC DATA:  1. On plain fluoroscopy, the coronaries were heavily calcified.  2. Left anterior descending artery is heavily calcified.  The left      main is short and is a common ostium without significant narrowing.      The LAD itself has a dense calcification proximally with a high-      grade 90% stenosis.  After this, there is diffuse luminal      irregularity with 40 and 30% narrowings to take off of the      diagonals.  There is moderate haziness and probably what represents      diffuse disease in the distal vessel, although no high-grade focal      obstruction.  3. The previous stented site in the ramus intermedius remains widely      patent.  There is luminal irregularity throughout the  remainder of      the ramus intermedius, with probably 30% narrowing before a      downturn, but it does not appear to be critically diseased and it      bifurcates distally.  4. The AV circumflex provides a modest size AV circumflex branch.      There is a fairly small marginal branch, which has about 80% ostial      narrowing, but is certainly not very ideal for a percutaneous      intervention.  5. The right coronary artery has also calcified.  There is diffuse      luminal irregularity with segmental 30% narrowing and a mid focal      stenosis of 50%.  The distal vessel provides a posterior descending      and posterolateral branch.  The distal PDA probably has about 40%      narrowing at a bend point, but no critical stenosis.  This is just  before the bifurcation of the distal PDA.   CONCLUSION:  Progression of disease in the left anterior descending  artery.   PLAN:  Renal consultation will be obtained.  We will discuss this with  them.  She will likely need percutaneous stenting of the LAD with  adjunctive rotational atherectomy for plaque modification.  This will be  discussed with the patient's family in detail.      Arturo Morton. Riley Kill, MD, [facility]  Electronically Signed     TDS/MEDQ  D:  05/22/2008  T:  05/22/2008  Job:  [id number]   cc:   [name]. [name], MD, [facility]

## 2010-12-28 NOTE — Consult Note (Signed)
NAMEDELAYNEE, ALRED             ACCOUNT NO.:  [id number]   MEDICAL RECORD NO.:  [id number]          PATIENT TYPE:  INP   LOCATION:  2040                         FACILITY:  [facility]   PHYSICIAN:  Aram Beecham B. Eliott Nine, M.D.DATE OF BIRTH:  07/10/1935   DATE OF CONSULTATION:  03/01/2009  DATE OF DISCHARGE:                                 CONSULTATION   REASON FOR CONSULTATION:  We are asked by Dr. Madelon Lips to see this  patient because of acute on chronic renal insufficiency.   HISTORY OF PRESENT ILLNESS:  She is a 76 year old white female who has a  history of chronic kidney disease with a baseline creatinine of around  2.2. The etiology is polycystic kidney disease. She underwent a left  total knee replacement on February 27, 2009. Preoperative labs, which were  done on the 12th showed a creatinine of 2.26. One day postoperative,  creatinine increased to 2.78 and this morning was 3.71. According to  urine output records, she has been non-oliguric, although only 600 to  700 cc of urine are recorded every day. Blood pressure has been running  in the low 90's to low 100's. Her IV fluids were increased this  afternoon to 100 cc per hour. She also has had some low grade fever and  a urine culture has been ordered.   She  has not received any nonsteroidal's.   There has been some postoperative hemoglobin variation with a drop from  14.6 on the 12th to 10.0 today.   She was taking Lisinopril and Lasix as an outpatient and both were held  for 24 hours prior to her surgery.   PAST MEDICAL HISTORY:  1. Chronic kidney disease - I believe her baseline creatinine is      around 2.2 with an estimated GFR in the mid 20's, making her stage      4. Etiology is polycystic kidney disease and she also has hepatic      cysts as extra-renal manifestations.  2. Status post failed right brachiocephalic AV fistula February 2009.  3. Coronary artery disease with history of myocardial infarction,      status post  Rota-blade atherectomy and drug eluting stent to the      LAD June 2009 by [facility] (she is followed by Dr. Theodoro Clock)      - ejection fraction 60% by 2-D echocardiogram July 2009 but does      have a history of diastolic congestive heart failure.  4. History of pacemaker for tachy-brady syndrome with history of      complete heart block post AV nodal ablation in the past.  5. PAF.  6. History of GI bleed.  7. Obesity.  8. Secondary hyperparathyroidism.  9. History of GERD.  10.History of cholecystectomy, appendectomy, and hysterectomy.  11.DJD.  12.History of lower extremity cellulitis.   ALLERGIES:  PROPYLURACIL (rash), AMIODARONE (question thyroiditis.)   CURRENT MEDICATIONS:  1. Amlodipine 10 mg daily.  2. Calcitriol 0.25 mcg Monday,Wednesday, and Friday.  3. Docusate 100 mg b.i.d.  4. Lovenox 30 mg b.i.d.  5. Iron 325 a day.  6. Lopressor 50 mg  b.i.d.  7. Protonix 40 mg daily.  8. Zocor 40 mg daily.  9. She is receiving p.r.n. doses of Tylenol, Robaxin, Oxycodone/APAP,      Docusate, Ambien and Dilaudid.  10.Her outpatient medications also included Furosemide 80 mg b.i.d.      and Lisinopril 40 mg per day, both of which were held 24 hours      prior to surgery and have not been given during this      hospitalization.  11.She was also taking Klor-Con and was on Omeprazole instead of      Protonix.   FAMILY HISTORY:  Positive for polycystic disease in her mother and her  only brother Lenox Ponds, who is on dialysis at the [facility]   SOCIAL HISTORY:  The patient has been a widow for 3 years. Husband died  of a myocardial infarction. She lives alone. She has 2 sons. She has a  remote tobacco history and drinks about a glass of wine a week. She says  she does not do anything for fun because of her knee.   REVIEW OF SYSTEMS:  At the present time is positive for an episode  earlier today of nausea, which was associated with substernal chest   discomfort. She had an episode of vomiting. She is complaining of pain  in her knee. She had the sensation earlier today that her heart was  beating too fast but this has resolved. She denies dysuria, hematuria,  chronic problems with swelling. She has had no skin rashes. No diarrhea.  She has not noticed any bleeding.   PHYSICAL EXAMINATION:  GENERAL:  She is a very nice older female lying  flat in bed on room air. She complains of knee pain. Her left lower  extremity is on CPM machine.  VITAL SIGNS:  Blood pressure 116/47 but has been as low today as 90/47.  NECK:  She has no JVD in a supine position.  CARDIAC:  Rhythm is regular at present. S1 and S2. No audible S3. A 1-  2/6 murmur upper sternal border. She has a pacemaker in the left chest,  which is non-inflamed and non-tender.  LUNGS:  Clear.  ABDOMEN:  Nondistended. She has scars from her prior abdominal  procedures. Bowel sounds are normally active. There was no focal  tenderness.  EXTREMITIES:  The right leg showed trace pretibial edema. Left leg was  in a CPM machine with ace wrap and dressings in place, which were not  removed. She has a scar in the right antecubital space from a prior  brachiocephalic AV fistula, which is not functional.  GENITOURINARY:  Foley catheter is draining dark yellow urine.   LABORATORY DATA:  This evening sodium 134, potassium 3.8, chloride 105,  CO2 is 20, BUN 62, creatinine 4.06, calcium 7.9, albumin 2.4, SGOT 83,  SGPT 18, WBC 7,800, hemoglobin 10. (Of note, labs on February 23, 2009  showed a creatinine of 2.26, hemoglobin of 14.6, urinalysis negative,  and urine culture negative.) Urinalysis from this evening as well as  urine electrolytes are pending.   Chest x-ray from this afternoon showed decreased lung volumes but no  edema and ultrasound was ordered by the primary service.   IMPRESSION:  A 76 year old white female with coronary artery disease,  history of atrial fibrillation, baseline  chronic kidney disease stage 4  with a baseline creatinine of around 2.2, s/  left total knee  replacement with:   1. History of chronic kidney disease Stage  4 secondary to polycystic      kidney disease.  2. Acute on chronic renal failure with doubling of creatinine up to 4      following total knee replacement. I suspect this is just primarily      hemodynamic in origin given her lowish blood pressures and her      postoperative drop in hemoglobin. Her ace inhibitor and diuretic      have been very appropriately on hold since 24 hours preoperatively      and should continue to be on hold. Her creatinine may well be      reaching a plateau, as there has been only a 0.3 mg per deciliter      rise in the last 12 hours.     a.  At this time I would simply hold her anti-hypertensive medications  (Norvasc) and it may be necessary to decrease her beta blocker but I  would not do this yet, as per    cardiology's outpatient notes, this was to optimize preoperative and  perioperative cardiac protection and also for heart rate control in the  event of rapid atrial fibrillation.    b.  I agree with gentle IV fluids at 100 cc per hour, as you are  doing.    c.  Her ace inhibitor and diuretics were held preoperatively and would  certainly continue to hold those at this time.    d.  Urinalysis and urine electrolytes have been ordered and I will  followup on these when available, along with her renal ultrasound.   1. Status post left total knee replacement.  Per Dr. Madelon Lips.  2. Coronary artery disease.  ASA and Plavix currently on hold (held 1      week preop also per patient)  3. History of atrial fibrillation.  4. History of diastolic congestive heart failure.  5. Secondary hyperparathyroidism, on Calcitriol.  6. History of gastrointestinal bleed in the past - this will need to      be kept in mind, given her fairly substantial drop in hemoglobin      since February 23, 2009.    Thanks for  asking [place] to see her. Hopefully her renal function will  plateau and things will turn around in the next 24 to 48 hours or so.   cc: Dr. Arrie Aran    Dr. Van Clines B. Eliott Nine, M.D.  Electronically Signed     CBD/MEDQ  D:  03/01/2009  T:  03/01/2009  Job:  [id number]

## 2010-12-28 NOTE — Op Note (Signed)
[name], [name]             ACCOUNT NO.:  [id number]   MEDICAL RECORD NO.:  [id number]          PATIENT TYPE:  INP   LOCATION:  2015                         FACILITY:  [facility]   PHYSICIAN:  [name]. [name], MD, FACCDATE OF BIRTH:  Nov 11, 1934   DATE OF PROCEDURE:  DATE OF DISCHARGE:                               OPERATIVE REPORT   TRANSESOPHAGEAL ECHOCARDIOGRAM WITH CARDIOVERSION:   INDICATION:  A 76 year old patient with a recurrent atrial fibrillation.  No anticoagulation due to GI bleed, congestive heart failure. TEE  cardioversion was indicated to restore sinus rhythm.  The patient has  just had a pacemaker placed.  She is pacer-dependent.  I had the  pacemaker representative with [place] at the time.   Interrogation of the atrial lead showed that she was in fairly rapid  atrial flutter or fibrillation.  The patient was sedated with 100 mcg of  fentanyl and 5 mg of Versed.  Using digital technique, an Omniplane  probe was advanced through esophagus without incident.  Transgastric  imaging showed normal LV function with an EF of 55-60% with mild LVH.  Mitral valve was mildly thickened.  There was moderate central mitral  insufficiency.  There was moderate biatrial enlargement.  The atrial  septum was intact.  There was no ASD.  Aortic valve was trileaflet and  sclerotic.  There was a pacemaker wire traversing the tricuspid valve.  There was moderate TR.  Imaging of the left atrial appendage showed no  spontaneous contrast or thrombus.  There was no significant aortic  debris.   Since we had such good sedation, the patient did not need to be  anesthetized.  She had a single biphasic 200-joule shock and normal  sinus rhythm was restored.   The patient tolerated the procedure well.  Her pacemaker will be  reprogrammed by the pacemaker representative.  She will be AV-paced at a  lower rate of 68.   She will be able to go home once her Coumadin is therapeutic.      [name].  [name], MD, [facility]  Electronically Signed     PCN/MEDQ  D:  03/02/2007  T:  03/03/2007  Job:  [id number]

## 2010-12-28 NOTE — Consult Note (Signed)
NAMESYERRA, [name] NO.:  [id number]   MEDICAL RECORD NO.:  [id number]          PATIENT TYPE:  INP   LOCATION:  4732                         FACILITY:  [facility]   PHYSICIAN:  Duke Salvia, MD, FACCDATE OF BIRTH:  1935/02/21   DATE OF CONSULTATION:  DATE OF DISCHARGE:                                 CONSULTATION   Thank you very much for asking [place] see Sharyne Peach in consultation  because of paroxysmal and rapid atrial fibrillation.   [name] is a 76 year old woman with ischemic heart disease, prior  OM stenting with near normal left ventricular function, with a history  of recurrent paroxysmal atrial fibrillation, with a rapid ventricular  response, now having undergone two hospitalizations in the last couple  of weeks, both of which have been precipitated by congestive heart  failure symptoms secondary to the rapid AF.  She underwent Cardioversion  2 weeks ago with TE guidance; unfortunately, this was held for only a  brief period of time.  She returns because of typical symptoms of  shortness of breath, nausea and lightheadedness.   The chart describes previous exposure to propafenone and/or flecainide,  discontinued because of coronary disease, as well as amiodarone; I do  not find in the charts the reasons for the discontinuing of the  amiodarone.  She has had significant bradycardia with junctional rhythms  and other things described both in the office and in the inpatient  setting, which have resulted in the down titration of her AV nodal  blocking agents.   Other congestive symptoms have included nocturnal dyspnea and peripheral  edema.   Her thromboembolic risk factors are notable for a) age,  b)  hypertension, c) congestive heart failure.   PAST MEDICAL HISTORY:  In addition to the above, is notable for:  1. Renal insufficiency with a creatinine about 2.  2. GE reflux disease.  3. Hypertension.  4. Ongoing tobacco use, although she  does has not smoked now in 2      weeks.   PAST SURGICAL HISTORY:  Notable for:  1. Cholecystectomy.  2. Hysterectomy.  3. Bladder or bowel surgery for tachy.  4. Appendectomy.   SOCIAL HISTORY:  She is widowed.  She lives alone.  She has two children  who are involved in her care.  Apparently, there is some significant  psychosocial stress outlined in previous notes.   REVIEW OF SYSTEMS:  Is otherwise noncontributory.   EXAMINATION:  GENERAL:  She is an elderly Caucasian female appearing her  stated age of 76.  VITAL SIGNS:  Heart rate is 116 and irregular.  Blood pressure 90/56.  NECK:  Veins were about 8 cm.  Carotids were brisk and full.  BACK:  Without kyphosis or scoliosis.  LUNGS:  Clear.  HEART:  Sounds were regular with an early systolic murmur.  ABDOMEN:  Soft and protuberant without tenderness.  There are active  bowel sounds.  Femoral pulses were 2+.  Distal pulses were intact.  There is trace edema.  NEUROLOGICAL:  Exam was grossly normal.  Her skin was warm and dry.   Electrocardiogram  dated this morning demonstrated atrial fibrillation at  a rate of 148. __________  /0.7/0.26.   LABORATORY:  Notable for an INR of greater than 10, a CBC with  hemoglobin of 6.6 and a hematocrit 19, compared to a hemoglobin of 11.5  two weeks ago.  Other labs are notable for BUN of 82 and a creatinine of  2.24.  Previous BUN was 25 with a creatinine of 1.65.   IMPRESSION:  1. Atrial fibrillation with a rapid ventricular response.  2. History of recurrent bradycardia causing down titration of AV nodal      blocking medications.  3. Anemia with an elevated INR and hypotension.  4. Previous amiodarone but can not find why it was stopped.  5. Congestive heart failure.  Acute on chronic diastolic, related to      #3 and #1.  6. Coronary artery disease with previously normal left ventricular      function of prior OM stenting.  7. Renal insufficiency worsened by #1 and #3.  8.  Hypertension, currently not an issue.   [name] is very ill with her anemia and hypotension.  She needs  transfusion reversal of her INR.   Thereafter, the issues of tachycardia and bradycardia can be sorted out.  I think the atrial fibrillation is indeed recalcitrant, and I am not  sure why the amiodarone was stopped, but mainly it was because of  bradycardia, and if that is the case, backup brady pacing might be  beneficial.   In addition, AV nodal blocking agents have been stopped.  Also, because  of bradycardia and certainly backup brady pacing would be useful for  that.  Based on the above; therefore, recommend:  1. Transfusion.  2. Mainly ICU support.  3. DC cardioversion, if that is the plan for evaluation for the      anemia.  4. Anemia probably prior to discharge.  5. Rate control.  Will have to follow the above.      Duke Salvia, MD, [facility]  Electronically Signed     SCK/MEDQ  D:  02/05/2007  T:  02/06/2007  Job:  [id number]

## 2010-12-28 NOTE — Discharge Summary (Signed)
[name], [name]             ACCOUNT NO.:  [id number]   MEDICAL RECORD NO.:  [id number]          PATIENT TYPE:  INP   LOCATION:  4711                         FACILITY:  [facility]   PHYSICIAN:  Peter C. [name], MD, FACCDATE OF BIRTH:  21-Jan-1935   DATE OF ADMISSION:  01/19/2007  DATE OF DISCHARGE:  01/23/2007                               DISCHARGE SUMMARY   Primary care physician:  Dr. Clarene Duke in [place].  Primary cardiologist:  Dr. Charlton Haws.   DISCHARGE DIAGNOSIS:  Atrial fibrillation with rapid ventricular  response.   SECONDARY DIAGNOSES:  1. Coronary artery disease coronary artery disease.      a.     January 19, 2005, status post percutaneous coronary intervention       of the obtuse marginal #1 with a 2.2 x 30 mm Cypher drug-eluting       stent.  2. Mild mitral regurgitation and mild tricuspid regurgitation.  3. Hypertension.  4. Chronic diastolic congestive heart failure.  5. Chronic renal insufficiency.  6. Gastroesophageal reflux disease.  7. Ongoing tobacco abuse.   ALLERGIES:  No known drug allergies.   PROCEDURES:  Transesophageal echocardiogram.   HISTORY OF PRESENT ILLNESS:  A 76 year old Caucasian female with the  above problem list, who was in her usual of health until approximately 4-  6 weeks prior to admission when she experienced tachypalpitations,  worsening dyspnea on exertion, decreased exercise tolerance, two-pillow  orthopnea, and increasing lower extremity edema and abdominal girth.  She saw her primary care [name], who increased her beta-blocker dose  and also added digoxin without any improvement in symptoms.  Because of  ongoing dyspnea and palpitations, her son brought her to the [facility]  ED on January 19, 2007, where she was found to be in atrial fibrillation  with RVR with rates in the 110s.  She was admitted for further  evaluation.   HOSPITAL COURSE:  The patient had a minimal elevation in troponin to  0.07 with normal CKs and MBs.  She  was placed on heparin and initiated  on Coumadin therapy and as she did not convert on her own, arrangements  were made arrangements were made for TEE and cardioversion.  On June 9  she was taken for a TEE, which showed no left atrial or left atrial  appendage thrombus.  Cardioversion then was successfully performed with  reversion to sinus and then after about 30 beats of sinus rhythm, she  reverted back to atrial fibrillation.  A second shock as delivered and  she was able to maintain sinus rhythm with rates in the mid to high 40s.  Because she was bradycardic, her digoxin was discontinued and her beta-  blocker dose was decreased to 25 mg daily, to be resumed on June 12.  She has been counseled on the importance of smoking cessation and has  also been giving heart failure and Coumadin education.  We will plan to  follow her up in Coumadin Clinic later this week and will make  arrangements for follow-up with Dr. [name] next week.  We do have  concern that she has some  degree of sick sinus syndrome and potentially  tachybrady syndrome, which may eventually require permanent pacemaker  placement.   DISCHARGE LABORATORY DATA:  Hemoglobin 11.5, hematocrit 34.5, WBC 3.7,  platelets 135, MCV 82.7.  Sodium 140, potassium 3.8, chloride 106, CO2  27, BUN 25, creatinine 1.64, glucose 89.  PT 31.1, INR 2.8.  CK 37, MB  1.9, troponin I 0.06.  Calcium 8.6.  magnesium 1.5.  TSH was low at  0.294 with a normal free T4 at 1.58 and a normal free T3 at 2.6.  Digoxin was 1.0.  BNP was 662.0.   DISPOSITION:  The patient is being discharged home today in good  condition.   FOLLOW-UP PLANS AND APPOINTMENTS:  The patient has Coumadin Clinic  follow-up on June 13 at 10:45 a.m..  Will follow up with Dr. [name] next  week and we will contact her with that appointment.   DISCHARGE MEDICATIONS:  1. Metoprolol ER 25 mg daily.  2. Aspirin 81 mg daily.  3. Plavix 75 mg daily.  4. Coumadin 5 mg q.h.s.  5.  Prozac 20 mg daily.  6. Clonidine 0.2 mg b.i.d.  7. Simvastatin 40 mg q.h.s.  8. Lasix 20 mg daily.  9. Klor-Con 20 mEq daily.  10.Zantac 150 mg daily.  11.Actonel 35 mg every Sunday.  12.Nitroglycerin 0.4 mg sublingual p.r.n. chest pain.   OUTSTANDING LAB STUDIES:  None.   DURATION OF DISCHARGE ENCOUNTER:  45 minutes.      Nicolasa Ducking, ANP      [name]. [name], MD, [facility]  Electronically Signed    CB/MEDQ  D:  01/23/2007  T:  01/24/2007  Job:  [id number]   cc:   Dr. Clarene Duke

## 2010-12-28 NOTE — Assessment & Plan Note (Signed)
[facility]                         ELECTROPHYSIOLOGY OFFICE NOTE   [name], [name]                    MRN:          [id number]  DATE:11/09/2007                            DOB:          Aug 08, 1935    [name] returns today for followup.  She is a very pleasant  elderly woman with multiple medical problems including symptomatic  bradycardia as well as chronic atrial fibrillation as well as chronic  renal insufficiency with near end-stage renal disease.  The patient  recently had a fistula placed but it apparently has not matured  appropriately and has not thought to be usable for hemodialysis.  Fortunately, she has not required dialysis.  She is pending evaluation  for revision versus a Gore-Tex graft to be placed.  The patient notes  that she has continued to be bothered by generalized fatigue and  headache, peripheral edema and dyspnea with exertion.  She has had no  syncope.   MEDICATIONS:  1. Lisinopril 40 daily.  2. Actonel 30 weekly.  3. Potassium 20 twice daily.  4. Simvastatin 40 q.h.s.  5. Aspirin 81 daily.  6. Furosemide 40 mg 1/2 tablet daily.  7. Metoprolol 75 mg twice daily.  8. Xanax and Ambien p.r.n.   EXAM:  She is a pleasant ill-appearing elderly woman who is no acute  distress.  Her blood pressure was 173/90.  The pulse was 67 and irregular.  The  respirations were 18.  The weight was 184 pounds.  NECK:  Revealed 7 cm jugular distention.  There was no thyromegaly.  The  trachea was midline.  LUNGS:  Clear bilaterally to auscultation.  No wheezes, rales, or  rhonchi are present.  CARDIOVASCULAR:  Irregularly irregular rhythm with normal S1-S2.  ABDOMEN:  Soft, nontender.  EXTREMITIES:  Demonstrated trace peripheral edema in lower extremities.   Interrogation of the pacemaker demonstrates a Medtronic InSync.  Fib  waves were 1 mV.  There were no R waves secondary to complete heart  block.  The impedance was 427  in the Atrium, 564 in the right ventricle,  and 655 in the left ventricle.  Threshold was 1.4, both in the right  ventricle and the left ventricle.  Battery voltage was over 3 volts.  She was left in the DDIR mode at a lower rate of 60.  She was V pacing  97% of the time.   IMPRESSION:  1. Atrial fibrillation.  2. Congestive heart failure.  3. Status post arteriovenous node ablation and pacemaker (bi-V).  4. Chronic renal insufficiency.   DISCUSSION:  [name] is stable today.  I have encouraged her to  maintain a low-sodium diet and to work hard on keeping her blood  pressure under control.  She will likely, in the future, require  dialysis, but at the present time she does not.  We will see her back in  for pacemaker followup in November.     Doylene Canning. Ladona Ridgel, MD  Electronically Signed    GWT/MedQ  DD: 11/09/2007  DT: 11/10/2007  Job #: [id number]

## 2010-12-28 NOTE — Consult Note (Signed)
NAMENAJAT, OLAZABAL NO.:  [id number]   MEDICAL RECORD NO.:  [id number]          PATIENT TYPE:  INP   LOCATION:  2040                         FACILITY:  [facility]   PHYSICIAN:  Luis Abed, MD, FACCDATE OF BIRTH:  [birth year]   DATE OF CONSULTATION:  DATE OF DISCHARGE:                                 CONSULTATION   Ms. Kwolek is a very pleasant 76 year old female who is currently  hospitalized after total knee replacement by Dr. Madelon Lips.  She is  actually doing well.  She does have renal insufficiency and the renal  consults are seeing her.  The patient has a baseline creatinine that is  significantly elevated related to polycystic disease.  She has had  worsening in her renal function since being here and this is being  managed by the Renal team.  Her creatinine is up to 3.85.   The patient has a history of coronary artery disease.  She had a complex  LAD intervention in July 2009.  She was seen by Dr. [name] in the office  on January 19, 2009.  At that time, her Myoview scan showed no significant  ischemia.  I have reviewed all of Dr. Fabio Bering notes.  Dr. [name] felt  that the patient was stable and that she could go ahead with knee  surgery.  He gave permission for her aspirin and Plavix to be held.  He  wanted her to have very careful attention after her surgery to watch for  atrial fibrillation and to be sure that her anticoagulation status was  fully in place.  We are asked today to comment on whether or not her  aspirin and Plavix should be restarted.  As mentioned, the aspirin and  Plavix were stopped 5 days before admission.  Historically, the patient  has a normal ejection fraction of 60%.  She does have a history of  paroxysmal atrial fibrillation.  There is a history of a GI bleed.  However, she has tolerated aspirin and Plavix well recently.  She was  admitted with a hemoglobin in the range of 14.  She also has a permanent  pacemaker that was placed  in the past for sick sinus syndrome.  There is  a history of hypertension.  There is a history of peripheral edema.   PAST MEDICAL HISTORY:  The patient has a pacemaker as mentioned.  She  has had some bradycardia in the past.  There is a history of anemia and  she has had cellulitis in the past.  There is a history of edema.  The  patient has had hyperthyroidism by history.  There is renal  insufficiency and hypertension.  She has GERD.  She has had some  depression.  There has been some diastolic heart failure over time.  She  has atrial fibrillation that is paroxysmal.   ALLERGIES:  PROPYLTHIOURACIL is listed as an allergy.   MEDICATIONS:  Currently in the hospital, she is on Lovenox, Colace,  pantoprazole, Norvasc 10, Lopressor 50 b.i.d., simvastatin 40,  calcitriol, iron, and pain medicines.   SOCIAL HISTORY:  The patient is  widowed and retired.  She does not smoke  or use alcohol.   FAMILY HISTORY:  There is no strong family history of coronary disease  and her family history otherwise is unremarkable.   REVIEW OF SYSTEMS:  The patient has not been bothered by weight gain or  weight loss.  She has no fevers, chills, or skin rashes.  She is not  having any headaches.  There has been no change in her vision or her  hearing.  Her musculoskeletal problems of course relate to her knee  problem.  She does not have a cough.  There is no chest pain.  There is  no shortness of breath.  She has not had any dizziness.  She is not  having any burning in her urine.  She has no GI symptoms.  She is not  having any GYN symptoms or GU symptoms.  All other systems are reviewed  and are negative.   PHYSICAL EXAMINATION:  VITAL SIGNS:  The patient is quite stable today.  Her blood pressure is 118/57 with a pulse of 60.  Respirations are 18.  Temperature is 98.8.  Her O2 saturation is 95% on 2 L.  GENERAL:  The patient is oriented to person, time, and place.  Affect is  normal.  HEENT:  Head  is normocephalic.  No xanthelasma.  She has normal  extraocular motion.  She is not having any active dental abnormalities  at this time.  NECK:  Supple.  There is no jugular venous distention.  There are no  carotid bruits.  LUNGS:  A few scattered rhonchi.  CARDIAC:  S1 and S2.  There are no clicks or significant murmurs.  ABDOMEN:  Soft.  There are no masses or bruits.  EXTREMITIES:  She has the wrapping on her left leg from her knee  surgery.  There is no significant peripheral edema.  NEUROLOGIC:  The patient is oriented to person, time, and place.  Affect  is normal.   LABORATORY DATA:  Hemoglobin is 9.8 today.  This has come down from an  admission of 14.  This is being monitored.  Her BUN is 63 with a  creatinine of 3.85.  This is slightly improved from yesterday.  Potassium is 3.9.  Chest x-ray on March 01, 2009, revealed some  atelectasis but no signs of heart failure.  EKG is not yet available.  Monitor telemetry strips revealed that her rhythm is paced.   PROBLEMS:  1. Chronic kidney disease with worsening renal function this      admission.  She is being seen by the Nephrology team.  2. Coronary artery disease.  She has a complex LAD lesion with an      intervention in July 2009.  Myoview in June 2010 showed no      ischemia.  Because of the complex LAD lesion, we will want her on      aspirin and Plavix going forward.  I have written for it to be      started today if Dr. Madelon Lips is okay with this from the viewpoint      of the patient's knee and her hemoglobin.  3. History of ejection fraction 60%.  4. History of paroxysmal atrial fibrillation.  The patient appears to      be in regular rhythm.  We will need a 12-lead to be sure about her      underlying rhythm as she is paced at a regular rhythm.  At  this      moment, I am not sure about her Coumadin status.  She is not on      Coumadin today.  She is on Lovenox.  I will be calling Dr. [name]      to get further  information.  The patient tells me that she has not      been on Coumadin recently.  However, the chart seems to reflect      that she may well have been on Coumadin within the past year.  She      is protected today with Lovenox and aspirin and Plavix are to be      added as soon as possible.  I will speak with Dr. [name] about      whether or not Coumadin is to be added at some point.  Certainly      not until we have a better handle on her hemoglobin.  5. Status post knee replacement this admission by Dr. Madelon Lips, stable.  6. Aspirin and Plavix therapy.  See the note above.  7. Status post permanent pacemaker, stable.  8. History of hypertension, stable.  9. History of edema.  Her volume status is stable.  She is lying flat      in bed.  Her INR was slightly positive yesterday.  Careful      attention to her INR will be important especially with her Renal      status.  10.Question Coumadin.  See the discussion above and we will need input      from Dr. [name].      Luis Abed, MD, [facility]  Electronically Signed     JDK/MEDQ  D:  03/02/2009  T:  03/03/2009  Job:  [id number]   cc:   [name]. [name], MD, [facility]

## 2010-12-28 NOTE — Op Note (Signed)
[name], [name]             ACCOUNT NO.:  [id number]   MEDICAL RECORD NO.:  [id number]          PATIENT TYPE:  INP   LOCATION:  4732                         FACILITY:  [facility]   PHYSICIAN:  Doylene Canning. Ladona Ridgel, MD    DATE OF BIRTH:  [birth year]   DATE OF PROCEDURE:  02/13/2007  DATE OF DISCHARGE:                               OPERATIVE REPORT   PROCEDURE PERFORMED:  AV node ablation.   INDICATIONS:  Symptomatic atrial fibrillation with a rapid ventricular  response, with the patient having failed multiple AV nodal blocking  drugs, most recently a combination of metoprolol 200 mg a day, Lanoxin  0.125 a day, and despite this heart rates averaging over 110 beats per  minute.   INTRODUCTION:  The patient is a very pleasant 76 year old woman who was  admitted to the hospital with atrial fibrillation and rapid ventricular  response as well as a GI bleed.  The patient, unfortunately, was deemed  not to be a particularly good candidate for maintenance of sinus rhythm  secondary to her need to not be on Coumadin secondary to her GI bleeding  and is now referred for AV node ablation after previously undergoing  pacemaker implantation several days ago.  Of note, the patient initially  was hoped to be able to have a controlled ventricular rate with a  combination of AV nodal blocking drugs, but despite high doses of these  drugs her ventricular rate remained over 100 beats per minute.   PROCEDURE:  After informed was obtained, the patient was taken to the  diagnostic EP lab in the fasting state.  After the usual preparation and  draping, intravenous fentanyl and midazolam were given for sedation.  A  7-French quadripolar catheter was inserted percutaneously into the right  femoral vein and advanced up to the right atrium.  Mapping was carried  out along the AV node region, where the HV interval was 53 msec.  Initially a single RF energy application was delivered to the His bundle-  AV node  region, resulting in the creation of complete heart block.  After 5 minutes there was no conduction and no escape rhythm.  After  approximately 10 minutes, however, the AV conduction returned with an  escape rate between 70 and 90 beats per minute.  After deliberation as  to whether or not this might be satisfactory to obtain rate control, the  patient's rate actually increased and the 7-French quadripolar ablation  catheter was repositioned inside the right atrium and additional mapping  was carried out and subsequent RF energy application was also carried  out.  Additional 8 RF energy applications were subsequently delivered  and the patient developed complete heart block.  At this time 15 minutes  was allowed to elapse and no recurrent AV conduction was demonstrated,  and the catheter was removed.  The patient's pacemaker was reprogrammed  to VVIR at a rate of 90 beats per minute, and she was returned to her  room in satisfactory condition.   COMPLICATIONS:  There were no immediate procedure complications.   RESULTS:  This demonstrates successful AV node ablation  in a patient  with atrial fibrillation and a rapid ventricular response, resulting in  the creation of complete heart block.      Doylene Canning. Ladona Ridgel, MD  Electronically Signed     GWT/MEDQ  D:  02/13/2007  T:  02/13/2007  Job:  [id number]   cc:   [name]. [name], MD, [facility]

## 2010-12-28 NOTE — Op Note (Signed)
NAMECLARECE, [name]             ACCOUNT NO.:  [id number]   MEDICAL RECORD NO.:  [id number]          PATIENT TYPE:  OBV   LOCATION:  3729                         FACILITY:  [facility]   PHYSICIAN:  Doylene Canning. Ladona Ridgel, MD    DATE OF BIRTH:  10/06/1934   DATE OF PROCEDURE:  07/05/2007  DATE OF DISCHARGE:                               OPERATIVE REPORT   PROCEDURE PERFORMED:  Upgrade to a biventricular pacemaker from a  previously implanted dual-chamber pacemaker   INTRODUCTION:  The patient is a 76 year old woman with a history of  multiple medical problems including a nonischemic cardiomyopathy and  congestive heart failure with EF of 40%.  She has moderate to severe  mitral regurgitation.  She has pacing induced left bundle branch block  after AV node ablation for rapid atrial fibrillation and flutter with  uncontrolled ventricular response.  She now has underlying complete  heart block with a prolonged QRS duration of over 160 milliseconds, and  she is now referred for upgrade to a BiV pacemaker.   PROCEDURE:  After informed consent was obtained, the patient was taken  to the diagnostic EP lab in the fasting state.  After usual preparation  and draping, intravenous fentanyl and midazolam was given for sedation.  Thirty mL of lidocaine was infiltrated into the left infraclavicular  region.  A 5-cm incision was carried out over this region, and  electrocautery was utilized to dissect down to the fascial plane.  The  left subclavian vein was subsequently punctured, and the Medtronic MB II  guiding catheter was advanced into the right atrium.  A 6-French  hexapolar EP catheter was utilized to cannulate the coronary sinus which  was carried out without particular difficulty.  Having accomplished  this, the subselective catheter was utilized to subselectively cannulate  the posterolateral vein.  A Luge angioplasty guidewire was advanced out  the distance about two/thirds from base to apex.  A  subselective Attain  catheter was then advanced over the guide wire, and contrast venography  was carried out with a total of 4 mL of contrast demonstrating a  moderate to small posterolateral vein.  There was also evidence of a  high lateral vein also which was moderate to small in size.  With this  information, the Attain subselective catheter was removed, and the  Medtronic model 4193 88-cm passive fixation unipolar LV pacing lead  serial number [id number] was advanced into the posterolateral vein of  the left ventricle.  Ten volt pacing did not stimulate the diaphragm.  The lead was advanced about halfway from base to apex.  In this  location, the threshold was approximately 1 volt at 0.5 milliseconds,  and diaphragmatic stimulation was not seen at 10 volts.  The lead at  this point was liberated from its guiding catheter without difficulty  and secured to the subpectoralis fascia with a figure-of-eight silk  suture.  The sewing sleeve was also secured with silk suture.  Kanamycin  was then utilized to irrigate the pocket, and electrocautery was  utilized to enter the subcutaneous pocket.  The previously implanted  Medtronic dual-chamber pacemaker  was removed, and the new Medtronic  InSync III model 8042 BiV pacemaker serial number [id number] was  connected to the right atrial LV and RV leads and placed back in the  subcutaneous pocket.  The pocket was then irrigated with kanamycin.  It  should be noted that the pocket was revised with electrocautery to  assure appropriate size of the pocket to accommodate the new larger  biventricular pacemaker.  Following this, the patient's incision was  closed with a layer of 2-0 Vicryl followed by a layer of 3-0 Vicryl  followed by a layer of 4-0 Vicryl.  Benzoin was painted on skin.  Steri-  Strips were applied, and a pressure dressing was placed, and the patient  was returned to her room in satisfactory condition.   COMPLICATIONS:  There were  no immediate procedure complications.   RESULTS:  This demonstrates successful upgrade to a biventricular  pacemaker in a patient with class III heart failure, pacing-induced left  bundle branch block and a wide QRS with approximately 8 mL of IV  contrast utilized to do the procedure.      Doylene Canning. Ladona Ridgel, MD  Electronically Signed     GWT/MEDQ  D:  07/05/2007  T:  07/05/2007  Job:  [id number]   cc:   [name]. [name], MD, [facility]  Terrial Rhodes, M.D.

## 2010-12-28 NOTE — H&P (Signed)
[facility] ADMISSION   Alexandria, [name]                    MRN:          [id number]  DATE:02/05/2007                            DOB:          March 06, 1935    REASON FOR VISIT:  [name] was seen as an add on today in the office.  She  has been feeling ill since Friday.  She has had increasing palpitations  with shortness of breath and anxiety.   She has had sick sinus syndrome.  She has previously been on flecainide  which was stopped for coronary disease, amiodarone and propafenone. She  has had refractory PAF.  She just had a TEE guided cardioversion January 22, 2007.  At the time there was no clot.  She had mild left ventricular  hypertrophy with an ejection fraction of 55%.  There was mild to  moderate MR and a trivial PFO.   She was actually somewhat difficult to cardiovert requiring two biphasic  shocks at 200 joules.  She subsequently had significant bradycardia.  She is very sensitive to beta blockers.  I told [name] today that I  thought she needed to be admitted to the hospital.  She is fairly  symptomatic currently with a respiratory rate of 24 and a blood pressure  of 95.  Her INR was reading high at the Coumadin clinic.  Two weeks ago  it was Rx at 2.8.  She will need a stat PT and Hb/Hct at the hospital.  My preference would be to admit her for acute cardioversion since she  has been therapeutic on her Coumadin since her TEE cardioversion.  Subsequently she will have an EP evaluation for pacemaker and AV node  ablation.   The patient has had a recent catheterization and her coronaries are  stable.  She has not had any significant chest pain.  She was feeling  ill on Friday.  She had some diaphoresis.  She noted increasing  palpitations and she felt she was back in atrial fibrillation.  She did  not want to go to the hospital.  She waxed and waned over the weekend  and this morning had marked  shortness of breath. She also feels that her  kidneys are not working well as her urinary output has decreased.   REVIEW OF SYSTEMS:  Continued use of Aleve and ? darker stools   MEDICATIONS:  1. Klor-Con 40 mEq daily.  2. Zantac 150 mg daily.  3. Lopressor 25 mg b.i.d.  4. Plavix 75 mg daily.  5. Aspirin  6. Actonel 325 mcg weekly.  7. Xanax p.r.n.  8. Imdur 30 mg daily.  9. Lexapro 10 mg daily.  10.Lisinopril 40 mg daily.  11.Lasix 20 mg daily.   FAMILY HISTORY:  Noncontributory.   SOCIAL HISTORY:  She is widowed.  She lives by herself.  Her son was  with her today.  She is fairly active when she is not sick.  She does  not drink or smoke.   ALLERGIES:  She has no known drug allergies.   PHYSICAL EXAMINATION:  GENERAL APPEARANCE:  Her exam is remarkable for a  fairly sick-appearing 76 year old female with some shortness of breath.  VITAL SIGNS:  Respiratory rate 24, blood pressure 92/50, pulse is 148.  She is afebrile.  Weight 180.  HEENT:  Normal.  Carotids have no bruits.  NECK:  There is no thyromegaly and no lymphadenopathy.  LUNGS:  The lungs are currently clear with good diaphragmatic motion.  HEART:  There is an S1 and S2 with a systolic ejection murmur.  PMI is  normal.  ABDOMEN:  Benign.  There is some ecchymosis from recent Lovenox shots.  Bowel sounds are positive.  There is no hepatosplenomegaly and no  hepatojugular reflux.  No abdominal aortic aneurysm.  EXTREMITIES:  Distal pulses are intact with no edema.  There are mild  varicosities.  NEUROLOGICAL:  Nonfocal.  SKIN:  Warm and dry.   CLINICAL DATA:  Electrocardiogram shows rapid atrial fibrillation.   IMPRESSION AND PLANS:  1. Recurrent rapid atrial fibrillation with sick sinus syndrome, admit      for acute cardioversion.  2. Check STAT PT.  Currently on aspirin, Plavix and Coumadin for      anticoagulation.  No left atrial appendage thrombus by TEE on January 22, 2007 on systemic Coumadin since  then.  3. Electrophysiology evaluation for possible pacemaker given sick      sinus syndrome with recurrent rapid atrial fibrillation.  4. History of coronary artery disease not having angina.  Stent patent      with no residual disease by recent catheterization.  Continue      aspirin, Plavix and beta blocker.  5. Darker stools with likely supra Rx INR, check guaics Hb/Hct to R/O      gi bleed.  BP will be supported with fluids until we know her lab      work   Further recommendations will be based on her response to cardioversion  and electrophysiology evaluation.     [name]. [name], MD, [facility]  Electronically Signed    PCN/MedQ  DD: 02/05/2007  DT: 02/05/2007  Job #: [id number]

## 2010-12-28 NOTE — Assessment & Plan Note (Signed)
OFFICE VISIT   MACY, POLIO J  DOB:  [birth year]                                       11/08/2007  ZOXWR#:[id number]   [name] returned to the office today after undergoing a right  brachiocephalic arteriovenous fistula.  This was a small borderline vein  at 3 mm.  There were no plans to place permanent access unless the  fistula matured.   The fistula has thrombosed.  She has a 2+ right radial pulse.  No  perioperative complications evident.   BP 151/89, pulse 72 per minute, regular, respirations 18 per minute.   Occluded right brachiocephalic arteriovenous fistula.  No plans at this  time to place permanent access until referred back from Nephrology.   Balinda Quails, M.D.  Electronically Signed   PGH/MEDQ  D:  11/08/2007  T:  11/09/2007  Job:  [id number]   cc:   Terrial Rhodes, M.D.

## 2010-12-28 NOTE — Procedures (Signed)
CEPHALIC VEIN MAPPING   INDICATION:  Preoperative cephalic vein measure for dialysis fistula.   HISTORY:   EXAM:   The right cephalic vein is compressible.   Diameter measurements range from 0.18 cm AP to 0.34 cm AP in the upper  arm.   The left cephalic vein is compressible.   Diameter measurements range from 0.12 cm AP to 0.18 cm AP.   See attached worksheet for all measurements.   IMPRESSION:  Patent bilateral cephalic veins which are not of acceptable  diameter for use as a dialysis access site on the left.  It is of  acceptable diameter on the right.   ___________________________________________  V. Charlena Cross, MD   DP/MEDQ  D:  09/24/2007  T:  09/25/2007  Job:  [id number]

## 2010-12-28 NOTE — H&P (Signed)
[name], [name]             ACCOUNT NO.:  [id number]   MEDICAL RECORD NO.:  [id number]          PATIENT TYPE:  INP   LOCATION:  6529                         FACILITY:  [facility]   PHYSICIAN:  Alexandria C. [name], MD, FACCDATE OF BIRTH:  1935/05/23   DATE OF ADMISSION:  01/19/2007  DATE OF DISCHARGE:                              HISTORY & PHYSICAL   PRIMARY CARDIOLOGIST:  Dr. Charlton [name].   PRIMARY CARE [name]:  Dr. Clarene [name] in [place].   PATIENT PROFILE:  A 76 year old Caucasian female with prior history of  CAD and paroxysmal atrial fibrillation, who presents with a 4-6 week  history of tachy palpitations and worsening dyspnea on exertion.   PROBLEM LIST:  1. Coronary artery disease.      a.     January 19, 2005, cardiac catheterizations post PCI of the OM1       with 2.5 x 30-mm Cipher drug-eluting stent placed.  2. Paroxysmal atrial fibrillation.      a.     A 2-D echocardiogram February 2006, EF 55-65%.  3. Mild MR and mild TR.  4. Hypertension.  5. Chronic diastolic congestive heart failure.  6. Chronic renal insufficiency.  7. GERD.  8. Ongoing tobacco abuse, smokes about two packs of cigarettes per      month.   HISTORY OF PRESENT ILLNESS:  A 76 year old Caucasian female with history  of CAD, PAF and diastolic heart failure.  She was in her usual state of  health approximately 4-6 weeks ago, when she began to experience tachy  palpitations, worsening dyspnea on exertion, decrease in exercise  tolerance, two-pillow orthopnea, as well as recliner orthopnea,  increasing lower extremity edema and increasing abdominal girth.  She  saw her primary care my earlier this week and her Toprol was increased  to 100 mg daily, and digoxin was added to her regimen.  She has had no  changes in symptoms or palpitations, since addition or changes in  medications, and today she felt more short of breath while in a walking  supermarket.  Her son brought her into the [facility] ED where she  is a-  fib with IVR and the rates in 110's.  She currently denies any chest  pain or shortness of breath.   ALLERGIES:  NO KNOWN DRUG ALLERGIES.   HOME MEDICATIONS:  1. Potassium chloride 40 mEq daily.  2. Zantac 150 mg daily.  3. Plavix 75 mg daily.  4. Aspirin 81 mg q.d.  5. Lisinopril 40 mg q.d.  6. Digoxin 0.25 mg q.d.  7. Toprol XL 100 mg q.d.  8. Fluoxetine 20 mg q.d.  9. Clonidine 0.2 mg b.i.d.  10.Lasix 20 mg q.d.   FAMILY HISTORY:  Mother died of sepsis and perforated colon at age 97.  Father died in his 49s from a motor vehicle accident.  She has one  brother who has a history of CVAs and kidney disease.   SOCIAL HISTORY:  She lives in [place] by herself.  She is retired from  [employer].  She smokes about 2 packs of cigarettes per  month and has  done so for many years.  She denies any alcohol or drugs  and does not routinely exercise and/or sticking to any sort of diet.   REVIEW OF SYSTEMS:  She notes some sensation of throat swelling, feeling  as though her uvula is a wet sock in the back of her throat, and has  also had a cough.  She had some as chest pressure that lasts or occurs  intermittently seconds at a time.  She has had two-pillow to recliner  orthopnea for the past week.  She had increasing abdominal girth and  lower extremity edema.  All other systems reviewed are negative.   PHYSICAL EXAM:  VITAL SIGNS:  Temperature 98.2, heart rate 110,  respirations 20, blood pressure 146/96, pulse ox 99% on 2 liters.  GENERAL:  Pleasant white female in no acute distress.  Awake, alert and  oriented x3.  NECK:  No bruits, JVD.  LUNGS:  Respirations regular, unlabored, clear to auscultation.  CARDIAC:  Irregularly irregular, S1, S2.  No S3, S4 or murmurs.  ABDOMEN:  Round, soft, nontender.  Bowel sounds present x4.  EXTREMITIES:  Warm, dry, pink with trace bilateral lower extremity  edema.  Dorsalis pedis, posterior tibial pulses 1+ and equal   bilaterally.  HEENT:  Normal.  NEUROLOGIC:  Grossly intact, nonfocal.   Chest x-ray shows no acute disease.  EKG shows a-fib/flutter at the rate  of 90 beats per minute, normal axis, no acute ST-T changes.   LAB WORK:  Hemoglobin 12.3, hematocrit 37, WBC 4.3, platelets 175,  sodium 140, potassium 3.1, chloride 107, CO2 22, BUN 25, creatinine 2,  glucose 91, CK-MB less than 1, troponin-I less than .05, BNP 662,  digoxin 1.  1. A-fib/flutter, paroxysmal but apparently present now for the past 4-      6 weeks.  Plan to admit, check TSH, magnesium.  She is hypokalemic      and will replace.  To continue beta-blocker, dig and add heparin      and Coumadin.  If does not convert her over the weekend, plan for      TEE and cardioversion on Monday.  Check an echo in the meantime.  2. Acute/chronic diastolic congestive heart failure.  EF in 2006 was      normal.  Symptoms likely related to #1.  Change Lasix to b.i.d. and      otherwise continue beta blocker.  BP controlled with clonidine.      She is reporting some throat swelling, and there is question of      mild angioedema, as well as persistent non-productive cough.  Ace      is going to be held.  3. Coronary artery disease.  She had a brief fleeting chest pain over      the past 4-6 weeks.  It is atypical.  Will check cardiac markers.      Continue aspirin, beta blocker, Plavix.  Will add statin therapy,      as she has never been on a Statin before and has known coronary      disease.  4. Chronic renal insufficiency, creatinine 2.0 today.  Her previous      range appears to be between 1.5 and 2.5.  5. Hypertension.  Blood pressure is elevated.  Will continue to follow      and adjust medications as necessary.  6. GERD.  Continue Zantac.  7. Tobacco abuse cessation strongly advised.  8. Throat swelling.  She has been evaluated  by the ER staff who feel      she may have some mild angioedema.  Will hold her ACE inhibitor.     They are  going to treat her with some Solu-Medrol.  9. Depression.  Continue fluoxetine.      [name], ANP      Alexandria Pick. [name], MD, [facility]  Electronically Signed    CB/MEDQ  D:  01/19/2007  T:  01/19/2007  Job:  [id number]

## 2010-12-28 NOTE — Assessment & Plan Note (Signed)
[facility]                            CARDIOLOGY OFFICE NOTE   [name]                    MRN:          [id number]  DATE:09/09/2008                            DOB:          Dec 17, 1934    [name] returns today for followup.  She is a elderly patient with  atrial fibrillation, coronary artery disease, sick-sinus syndrome with a  backup pacer, and chronic lower extremity edema.  She unfortunately has  continued horrible pain in her left knee.  She needs to have a left knee  replacement.  The patient had a very complex intervention to the LAD  diagonal branch on January 23, 2008.   She required adjunctive rotational atherectomy.   She also has a drug-eluting stent in the circumflex coronary artery.  We  had prohibited her from having surgery in her knee during the first 6  months of the procedure.  However, this coming July, she will have her  drug-eluting stents in for a year.  It is clear that [name]'s quality of  life is suffering greatly.  I told her we would do a pharmacological  stress test in June and so long as that is low-risk or nonischemic, she  would be cleared to have surgery with Dr. Madelon Lips, the first part of  July.  She understands that her age and with her coronary artery  disease, she is at moderate risk regardless.  The patient has chronic  AFib.  She has had a GI bleed.  I believe back in 2007.  Since it is  very important for her to be on aspirin and Plavix in regards to her  coronary artery disease, we have not placed her back on Coumadin.  I  explained to her that she would need to be on Coumadin once again in the  periop setting for knee replacement.   I think, we will also need to switch the patient's H2 blocker.  She is  on omeprazole, and I think I would prefer Protonix.  This will protect  her better in the stressful setting of the knee replacement and also  will not interact with Plavix.   The patient  has a backup pacemaker in for her sick-sinus syndrome.  We  will maximize her metoprolol to make sure her resting heart rate is in  the 50s to protect her as well as possible   In talking to Kauri, her activity is severely limited by knee pain, but  she has not had any angina, dyspnea, syncope, or palpitations.   There has been no recurrent GI bleeding.   ALLERGIES:  She is allergic to PTU.   MEDICATIONS:  She is on;  1. Omeprazole 20 mg a day.  2. Actonel 35 mg Monday, Wednesday, and Friday.  3. Lisinopril 40 a day.  4. Amlodipine 10 a day.  5. Plavix 75 a day.  6. Iron.  7. Metoprolol 50 b.i.d.  8. Simvastatin 40 a day.  9. Lasix 40 b.i.d.  10.Zolpidem 10 mg a day.   REVIEW OF SYSTEMS:  Otherwise remarkable for some cellulitis in the left  lower extremity.  She has been scratching her legs a lot.  She had some  small pustules show up and I believe her primary care doctor put her on  doxycycline recently.  This seems to be improving.  Her review of  systems otherwise negative.   PHYSICAL EXAMINATION:  GENERAL:  Remarkable for an elderly female.  She  is in AFib with pacer backup.  Her affect is appropriate.  VITAL SIGNS:  Her weight is 199; blood pressure 140/80; and pulse 64,  occasionally irregular.  HEENT:  Unremarkable.  NECK:  Carotids are normal without bruit.  No lymphadenopathy,  thyromegaly, or JVP elevation.  LUNGS:  Clear, good diaphragmatic motion.  No wheezing.  HEART:  S1 and S2 with a systolic ejection murmur.  PMI normal.  ABDOMEN:  Benign.  Bowel sounds positive.  No AAA.  No tenderness.  No  bruit.  No hepatosplenomegaly.  No hepatojugular reflux.  EXTREMITIES:  Distal pulses are intact.  She has +1 edema bilaterally  with healing cellulitis in left lower extremity.  There is crepitus with  decreased range of motion in the left knee.  NEURO:  Nonfocal.  SKIN:  Otherwise warm and dry.   Her EKG shows a paced rhythm with underlying AFib.    IMPRESSION:  1. In regards to preoperative clearance, she will be cleared for knee      surgery in July.  As long as her Myoview in June is nonischemic and      she does not have any recurrent symptoms, she will be able to stop      her Plavix 5 days before knee surgery.  We will optimize her beta-      blockade therapy prior to surgery.  2. History of reflux.  We will switch her omeprazole over to Protonix      in regards to any possible interaction with Plavix, to optimize her      anticoagulation, and also just decrease the risk of      gastrointestinal bleeding during the stress of a knee surgery.  3. Atrial fibrillation, good rate control.  Beta-blocker to be      optimized prior to knee surgery.  The patient has pacemaker backup.      She will follow up in the pacemaker clinic.  She is not pacemaker      dependent.  4. Lower extremity edema.  Continue Lasix b.i.d., elevate legs at the      end of the day, and low-sodium diet.  5. Cellulitis.  Continue doxycycline.  Followup with primary care MD.      Again, it is extremely important for her legs to be in good shape      as possible prior to knee surgery to decrease the risk of deep vein      thrombosis and recurrent infection.  6. Hypertension, currently well controlled.  Continue current dose of      lisinopril and amlodipine.  7. Anemia.  Continue iron replacement.  Followup primary care MD  8. History of gout.  Continue Zoldipram.  Fortunately, she has not had      any exacerbations on her b.i.d. diuretic.  She will continue to      have her uric acid monitored by her primary care MD.  9. Coronary artery disease, not having angina.  Continue aspirin,      Plavix, and beta-blocker.  Myoview to be done in June and if      nonischemic, clear to  have orthopedic surgery.     [name]. [name], MD, [facility]  Electronically Signed    PCN/MedQ  DD: 09/09/2008  DT: 09/10/2008  Job #: [id number]   cc:   Dyke Brackett, M.D.

## 2010-12-28 NOTE — Discharge Summary (Signed)
NAMEJOLICIA, [name]             ACCOUNT NO.:  [id number]   MEDICAL RECORD NO.:  [id number]          PATIENT TYPE:  INP   LOCATION:  5017                         FACILITY:  [facility]   PHYSICIAN:  Dyke Brackett, M.D.    DATE OF BIRTH:  03/18/1935   DATE OF ADMISSION:  02/27/2009  DATE OF DISCHARGE:                               DISCHARGE SUMMARY   ADMITTING DIAGNOSIS:  Left-knee osteoarthritis.   FINAL DIAGNOSES:  Status post left total knee replacement for left-knee  osteoarthritis.   PHYSICIAN:  Dyke Brackett, M.D.   ASSISTANT:  Sharol Given, P.A.   CONSULTATIONS:  [name]. [name], MD, F.A.C.C., Cardiology.  Renal consult.   PROCEDURE NOTE:  Patient was admitted to the hospital with left-knee  severe osteoarthritis, had a left total knee replacement, done by Dr.  Madelon Lips, Kateri Plummer, P.A. assisting, and then transferred to 2000  telemetry monitored bed in stable condition from the PACU.   First postoperative day was on February 28, 2009.  Patient was doing well.  We requested both cardiology and renal consults with neither of them  coming on that day at that point.  She was slow with physical therapy,  but vitals were stable and apparently there were no labs at that point  when they rounded on July 17.  She was doing well, overall though, as  far as pain controlled with a PCA.   Postoperative day #2 would have been on March 01, 2009.  Patient was  doing fairly well.  There were some concerns about her kidney function  as she does have chronic renal insufficiency and, despite the renal  consult, they had still not seen her at that point, so they reconsulted  and the renal doctors came to see the patient to help adjusting with her  creatinine, being at 3.71.  Otherwise, patient was doing well and stable  from orthopedic standpoint.  Pain was controlled.  Slow with physical  therapy.   Postoperative day #3, March 02, 2009, patient's chest x-ray was negative.  Hemoglobin was  9.8, creatinine was 3.85, improving slightly.  Medically  stable in regards to her total knee replacement, doing well with that.  We consulted cardiology the second time to get them to come see her.  Dr. [name] started following the patient in regards to her history of A-  Fib and stent placement a little over a year ago.   Postoperative day #4, March 03, 2009, patient was doing well, except  still now bowel movement at that point so we ordered an acute abdominal  series that came out negative.  Orthopedically she was stable.  Hemoglobin was at 9.3, so cardiology recommended giving one unit of  blood, which they transfused.  Vital signs were stable.  She was  afebrile.  Waiting to hear from cardiology and renal about when she  could be discharged to a skilled nursing facility.   Postoperative day #5, March 04, 2009, hemoglobin at 10.2, vital signs  were stable, afebrile.  Her nausea has improved with change also to  hydrocodone from Percocet per medicine.  Left lower  extremity  neurovascularly intact.  She had mild tenderness with palpation of her  calf that was still very soft.  Recommended before discharging to a  skilled nursing facility a venous Doppler ultrasound.  Pending that  being negative and approval from renal, plan to discharge to a skilled  nursing facility today, on March 04, 2009, as cardiology okayed it as  long as she is placed on Coumadin and she will be on Lovenox bridge  until her Coumadin level is up to between 2 and 3.   ASSESSMENT AND PLAN:  [name] is a 76 year old female, status post  left total knee arthroplasty, history of atrial fibrillation, chronic  renal insufficiency.  She is going to be discharged today, on March 04, 2009, to a skilled nursing facility, as long as approved by renal, and  she has a negative venous Doppler ultrasound of the left lower extremity  prior to discharge.  I spoke with cardiology and Dr. [name] recommended  her to be on  Coumadin for at least a month and to have Lovenox to help  bridge until she gets to Coumadin level of 2-3.  Renal is going to have  to give the final okay to let her go to the skilled nursing facility  today, but if that occurs, she will be discharged to a skilled nursing  facility, partial weightbearing, 50%, on that left lower extremity.  The  CPM machine should be 0-90 for 6-8 hours a day.  Until she gets to 90  degrees, increase 3-5 degrees per day.  She should be seen as a followup  by Dr. Madelon Lips in his office and call for appointment at [phone], which  should be 14 days postoperatively, to get staple removal and recheck in  the office.  Her initial operation date was on February 27, 2009, so  approximately around July 30 to August 1 is when she should be seen  postoperatively in the office.   DISCHARGE MEDICATIONS:  1. Coumadin, dose per pharmacy for a level of 2-3.  2. Lovenox 40 mg subcu at 8 a.m. each day until therapeutic with      Coumadin.  3. Norco 7.5/325 one to two tablets q. 4-6 h p.r.n. pain.  4. Amlodipine 10 mg daily.  5. Metoprolol 50 mg 2 tablets p.o. twice a day in the a.m. and p.m.  6. Simvastatin 40 mg p.o. q.h.s.  7. Calcitriol 0.25 mcg p.o.  8. Ferrous sulfate 325 mg p.o. daily.  9. Aspirin 81 mg p.o. daily.  10.Senokot S p.o. q.h.s. p.r.n.  11.Zofran 4 mg p.o. q. 6 h p.r.n. nausea.  12.Ambien 5 mg p.o. q.h.s. p.r.n.  13.Robaxin 500 mg one tablet p.o. q. 6-8 h p.r.n. pain, spasm.  Would      not take at the same time as Ambien.   Her other home medications were held at this point by renal physicians,  which is furosemide, which is 80 mg two times a day, was held for kidney  function.  Same for lisinopril 40 mg daily, which was held, and  potassium chloride 20 mEq two tabs per day was also held, all to help  her kidney function.  Dr. Arrie Aran is the renal doctor following the  patient.  They can be contacted to help determine when to start back up  those  medicines.      Sharol Given, PA      Dyke Brackett, M.D.  Electronically Signed    JBS/MEDQ  D:  03/04/2009  T:  03/04/2009  Job:  [id number]

## 2010-12-28 NOTE — Assessment & Plan Note (Signed)
[facility]                            CARDIOLOGY OFFICE NOTE   Alexandria, [name]                    MRN:          [id number]  DATE:04/08/2008                            DOB:          [birth year]    [name] is a elderly octogenarian, who is a 76 year old patient,  we have seen here at [facility] for quite some time.  We were asked to  clear her for left knee surgery with Dr. Madelon Lips.   The patient unfortunately has significant coronary artery disease.  She  has had a distant history of a stent to the OM.  She was recently  hospitalized in July 2009 for unstable angina.  She had a very complex  procedure to the proximal LAD.  This was done by Dr. Riley Kill, involved  heavily calcified lesion that was rotablated and then had a drug-eluting  stent placed.   Unfortunately, the patient has not been taking her Plavix recently.  She  indicated that she ran out of her prescription.  I explained to [name]  that there was extremely important for her to be on Plavix given the  high risk nature of the LAD lesion and the drug-eluting stent.   The patient has a previous history of iron-deficiency anemia and GI  bleeding.  She has been in chronic atrial fibrillation.  Since she  needed to be on aspirin and Plavix, we stopped her Coumadin.  I do not  feel comfortable having the patient on all 3 drugs with her history of  GI bleeding.   Currently, her coronary artery disease has taken precedence and I  thought that the Coumadin would be the best anticoagulant to stop.  From  a cardiac perspective, her angina has gone.  She is ambulatory and doing  well.  However, she does have left knee pain and has significant  osteoarthritis, clearly she would benefit from a knee replacement.  I  told [name], I would probably not want her to proceed with this for at  least 6 months after her complex intervention and most likely a year  would be better.  I do not know if Dr.  Madelon Lips can do any other steroid  injections or palliative therapy, but the ideal time to try to proceed  with left knee surgery would be in July 2010.   I called her prescription back into Plavix to the [facility] in [place].   PAST MEDICAL HISTORY:  Otherwise, remarkable for hyperthyroidism with an  allergy to PTU; history of diastolic heart failure, history of AV node  ablation with pacemaker, history of atrial fib flutter, history of GI  bleed on Coumadin, and chronic renal insufficiency, which is a little  unusual.  She had a fistula placed by Dr. Madilyn Fireman earlier this year, but  her creatinine is running 1.5-1.6.   REVIEW OF SYSTEMS:  Otherwise negative.   CURRENT MEDICATIONS:  On discharge were suppose to be on;  1. Metoprolol 50 b.i.d.  2. Simvastatin 40 a day.  3. Plavix 75 a day.  4. Aspirin.  5. Protonix 40 a day.  6. Iron.  7. Norvasc 5 a day.  8. Lasix 60 a day.  9. Lisinopril and Coumadin had been held.   ALLERGIES:  She is allergic to PTU, LATEX, and IVP DYE.   FAMILY HISTORY:  Noncontributory.   The patient lives by herself.  She is independent.  She does not smoke  or drink.  She has a family and children who help her.  She continues to  drive.   PHYSICAL EXAMINATION:  GENERAL:  Remarkable for an elderly white female  in no distress.  VITAL SIGNS:  Her blood pressure is 150/80; pulse is 70 and  electronically paced; and respiratory rate 14, afebrile.  HEENT:  Unremarkable.  NECK:  Carotids are without bruit.  No lymphadenopathy, thyromegaly, or  JVP elevation.  LUNGS:  Clear with good diaphragmatic motion.  No wheezing.  S1 and S2.  Soft systolic murmur.  PMI normal.  ABDOMEN:  Protuberant.  Bowel sounds positive.  No AAA.  No tenderness.  No bruit.  No hepatosplenomegaly or hepatojugular reflux.  No  tenderness.  EXTREMITIES:  Distal pulses are intact.  No edema.  NEURO:  Nonfocal.  SKIN:  Warm and dry.  No muscular weakness.   IMPRESSION:  1. In  regards to preop clearance, I would not clear the patient for      surgery.  Right now, she is too close to her complex intervention      and has not been taking her Plavix regularly.  I suspect the best      approach if the patient can tolerate her knee pain would be to wait      until July 2010.  Prior to surgery, we would do a Myoview to rule      out ischemia.  I explained the importance to review of taking her      Plavix.  2. Sick sinus syndrome with the pacemaker and atrial fibrillation.      Currently not on Coumadin.  Depending on how she does with regular      Plavix and aspirin, we may need to reinstitute this, but she had a      gastrointestinal bleed and has an iron deficiency anemia.  She will      need further gastrointestinal workup.  3. Pacemaker therapy functioning normally.  Followup EP office.      Battery level, good.  4. Hypertension, currently well controlled.  Continue current      medications.  5. Chronic renal failure, followup with renal.  This has always been      somewhat of an enigma to me.  Her creatinine never seen that bad,      but she had a fistula placed earlier this year in February 2009 by      Dr. Madilyn Fireman.  Clearly it will be an issue in the future, as I suspect      she will need heart catheterization to further workup for heart      disease in the future, and there is also an issue with her heart      catheterization regarding IVP dye allergy as I prefer to      premedicate the patient.   [name] will follow up with me in January and a note was sent to Dr.  Candise Bowens office indicating that we did not want her to have orthopedic  surgery at this time.     [name]. [name], MD, [facility]  Electronically Signed    PCN/MedQ  DD: 04/08/2008  DT:  04/09/2008  Job #: [id number]   cc:   Dyke Brackett, M.D.

## 2010-12-28 NOTE — Cardiovascular Report (Signed)
NAMEJANELLI, [name]             ACCOUNT NO.:  [id number]   MEDICAL RECORD NO.:  [id number]          PATIENT TYPE:  INP   LOCATION:  4711                         FACILITY:  [facility]   PHYSICIAN:  Peter C. [name], MD, FACCDATE OF BIRTH:  01/04/1935   DATE OF PROCEDURE:  DATE OF DISCHARGE:                            CARDIAC CATHETERIZATION   TRANSESOPHAGEAL ECHOCARDIOGRAM CARDIOVERSION:   INDICATIONS:  A 76 year old patient with recurrent atrial fibrillation.  INR 1.8.  The patient on heparin at the time of the procedure.   Note should be made that the patient has sick sinus syndrome.  She tends  to be excessively bradycardic, low-dose beta blockers only were used  prior to the cardioversion.   PROCEDURE:  The patient was sedated with 100 mcg of fentanyl and 5 mg of  Versed.  Using digital technique, an Omniplane probe was advanced into  the distal esophagus without incident.   Transgastric imaging revealed mild LVH, ejection fraction 55%.  There  are no regional wall motion abnormalities.  Mitral valve was mildly  thickened with mild to moderate MR.  There was mild left atrial  enlargement and mild right atrial enlargement.  Right-sided cardiac  chambers were normal.  There was small PFO by color flow.  Left atrial  appendage was well-visualized in orthogonal planes.  There was no formed  thrombus.  Imaging of the aorta showed it to be trileaflet with  sclerosis and no stenosis.  Imaging of the aorta showed no significant  debris.   FINAL IMPRESSION:  1. Mild left ventricular hypertrophy, ejection fraction 55%.  2. No left atrial appendage thrombus.  3. Mild to moderate mitral regurgitation.  4. Trivial PFO.  5. Normal right-sided cardiac chambers.   The patient was well sedated with fentanyl and Versed.  Cardioversion  ensued.  The patient received two synchronized biphasic shock at 200  joules.  She converted the first time to sinus rhythm, but reverted to A  fib after  about 30 beats.  A second shock was delivered and she  converted to normal sinus rhythm at a rate of 48 and maintained this.   IMPRESSION:  Successful TEE-guided cardioversion.   The patient will have her digoxin stopped.  She will be maintained on  low-dose beta blocker.  I suspect she will need a pacemaker in the  future.  She tolerated the procedure well.  So long as her INR is  greater than 2 in the morning, we will stop her heparin and discharge  her home.      [name]. [name], MD, [facility]  Electronically Signed     PCN/MEDQ  D:  01/22/2007  T:  01/22/2007  Job:  [id number]   cc:   echo lab

## 2010-12-28 NOTE — Op Note (Signed)
[name], [name]             ACCOUNT NO.:  [id number]   MEDICAL RECORD NO.:  [id number]          PATIENT TYPE:  INP   LOCATION:  4732                         FACILITY:  [facility]   PHYSICIAN:  Doylene Canning. Ladona Ridgel, MD    DATE OF BIRTH:  02-Aug-1935   DATE OF PROCEDURE:  02/09/2007  DATE OF DISCHARGE:                               OPERATIVE REPORT   PROCEDURE PERFORMED:  Implantation of a dual-chamber pacemaker.   INDICATIONS:  Symptomatic tachy-brady syndrome with persistent atrial  fibrillation.   INTRODUCTION:  The patient is a 76 year old woman who has a history of  atrial fibrillation in the past as well as sinus bradycardia who is  admitted to hospital with atrial fibrillation and rapid ventricular  response.  She was found to have a GI bleed and was treated this but  continued to have persistent rapid atrial fibrillation and is now  referred for permanent pacemaker insertion.   PROCEDURE:  After informed consent was obtained the patient is taken to  diagnostic EP lab in fasting state.  After usual preparation and  draping, intravenous fentanyl and midazolam was given for sedation.  30  mL lidocaine was infiltrated in the left infraclavicular region.  A 5 cm  incision was carried out over this region.  Electrocautery utilized to  dissect down to the fascial plane.  The left subclavian vein was  punctured x2 after 10 mL of contrast injected into the left upper  extremity demonstrated vein to be patent.  The Medtronic model 5076 52-  cm active fixation pacing lead serial number [id number] was advanced in  the right ventricle and a Medtronic model 5076 45-cm active fixation  pacing lead serial number [id number] was advanced to the right atrium.  Mapping was subsequently carried out the right ventricle at the final  site on the RV septum. The R-waves measured 9 mV and the pacing  impedance with lead actively fixed was 700 ohms, threshold 0.3 volts at  0.5 milliseconds.  10 volts  pacing did not stimulate the diaphragm.  With the ventricular lead in satisfactory position, attention was then  turned to atrial lead which was placed in the anterolateral portion of  the right atrial appendage.  The fibrillation waves measured  approximately 1.52 mV.  The pacing in the AOO mode with lead actively  fixed demonstrated an impedance of 530 ohms and 10 volts pacing did not  stimulate the diaphragm.  These satisfactory parameters the leads were  secured to subpectoralis fascia with a figure-of-eight silk suture and  the sewing sleeve was also secured with silk suture.  Electrocautery  utilized to make subcutaneous pocket.  Kanamycin irrigation was utilized  to irrigate the pocket.  Electrocautery was utilized to assure  hemostasis.  The Medtronic Sensia dual chamber pacemaker serial number  [id number] was connected to the atrial and ventricular pacing leads and  placed back in the subcutaneous pocket.  Generator secured with silk  suture.  Additional kanamycin was utilized to irrigate the pocket.  The  incision closed with layer of 2-0 Vicryl followed by layer of 3-0  Vicryl.  Benzoin painted on  skin, Steri-Strips were applied and pressure  dressing was placed.  The patient was returned to her room in  satisfactory condition.   COMPLICATIONS:  There were no immediate procedure complications.   RESULTS:  Demonstrates successful implantation of a Medtronic dual-  chamber pacemaker in a patient with symptomatic tachy-brady syndrome and  A fib.      Doylene Canning. Ladona Ridgel, MD  Electronically Signed     GWT/MEDQ  D:  02/09/2007  T:  02/10/2007  Job:  [id number]   cc:   Alexandria Pick. [name], MD, [facility]

## 2010-12-28 NOTE — Cardiovascular Report (Signed)
[name], [name]             ACCOUNT NO.:  [id number]   MEDICAL RECORD NO.:  [id number]          [name] TYPE:  INP   LOCATION:  2924                         FACILITY:  [facility]   PHYSICIAN:  Alexandria Morton. Riley Kill, MD, FACCDATE OF BIRTH:  Nov 06, 1934   DATE OF PROCEDURE:  02/22/2008  DATE OF DISCHARGE:                            CARDIAC CATHETERIZATION   INDICATIONS:  [name] is a 76 year old who presented with unstable  angina.  She has had previous stenting of Alexandria circumflex coronary  artery.  She underwent repeat cardiac catheterization.  This  demonstrated a critical proximal stenosis in Alexandria left anterior  descending artery.  There is also circumflex marginal disease, and this  was felt to be significant.  However, Alexandria vessel was somewhat smaller.  Because of Alexandria proximal lesion, we recommended an attempted percutaneous  intervention.  Of note, Alexandria vessel was heavily calcified.  Thus  catheterization was recommended.   [name] and her family were apprised of all Alexandria risks.  I discussed  Alexandria case with Dr. Eden [name] regarding type of stent.  I then discussed this  with [name], and subsequently with her son.  Because of her  diabetes, we elected to place a drug-eluting stent, albeit given Alexandria  previous issues.  Also [name] was hydrated well and pretreated with  Mucomyst and saline with discontinuation of ACE inhibition to prevent  contrast-induced nephropathy.   PROCEDURE:  Percutaneous stenting of Alexandria proximal left anterior  descending artery with adjunctive rotational atherectomy.   DESCRIPTION OF PROCEDURE:  [name] was brought to Alexandria  catheterization laboratory and prepped and draped in Alexandria usual fashion  after informed consent.  Through an anterior puncture, Alexandria left femoral  artery was entered.  Bivalirudin was administered according to protocol  and ACT checked and found to be appropriate for percutaneous  intervention.  We initially tried a 3-5 and then  a 3-0 guiding catheter.  Subsequently, a 4-0 guiding catheter was utilized and provided  reasonable backup.  We considered Alexandria possibility of intravascular  ultrasound to gauge Alexandria need for percutaneous rotational atherectomy  because of Alexandria extraordinary calcification of Alexandria left anterior  descending artery.  However, because of Alexandria diffuseness of Alexandria disease  distal to Alexandria lesion, I elected not to do this and instead elected to  recommend rotational atherectomy.  Of note, [name] has an  indwelling permanent pacemaker.  We then passed a Roto-Floppy wire into  Alexandria distal vessel, and intracoronary nitroglycerin was administered.  Two passes were performed without significant drop in rotational speed.  There did appear to be a smoother, improved appearance to Alexandria vessel.  Following this, dilatation was done with a 2.0 and then a 2.25-mm  balloon.  Following this, we elected to place a 13-mm stent.  A Cypher  drug-eluting stent was then placed at Alexandria lesion site.  Alexandria Cypher  extended proximally into a somewhat larger part of Alexandria artery, so Alexandria  artery is postdilated using a 3-mm balloon.  We then placed a proximal  balloon of 3.5 mm because of Alexandria size of Alexandria proximal vessel, and  post  dilatations were done up to a size of approximately 3.4 mm.  Alexandria highest  in-stent pressure using a noncompliant balloon was 15 atmospheres.  We  then went back because of a small area of narrowing in Alexandria stent itself  and post dilated this up to 14-15 atmospheres again and there was marked  improvement in appearance of Alexandria artery.  There was some slight oozing  at Alexandria sheath but after removal of Alexandria catheter, this resolved.  There  is a small hematoma.  [name]'s pressure was elevated, so we started  IV nitroglycerin.   ANGIOGRAPHIC DATA:  Alexandria left anterior descending artery is heavily  calcified.  There is a high-grade, heavily calcified lesion proximally.  Just distal to this, vessel opens up and  then there is another area of  about 40% narrowing and Alexandria vessel then opens up.  There is diffuse  luminal irregularity throughout Alexandria vessel beyond this location.  Following stenting, this 95% stenosis was reduced to 0% with what  appeared to be good apposition and edges.  Alexandria ramus intermedius vessel  which has been previously stented is widely patent.  Alexandria AV circumflex  has a stenosis overlying that of a smaller marginal branch that was also  shown to [name]'s family.   CONCLUSION:  Successful percutaneous stenting of Alexandria left anterior  descending artery with adjunctive rotational atherectomy.   DISPOSITION:  [name] will need to remain on aspirin and Plavix.  She does have a drug-eluting stent in Alexandria circumflex.  There has been a  history of GI bleeding in Alexandria past approximately 1 year ago, so at one  year decision will need to be made.  She will be followed closely by Dr.  Eden [name].  In addition, Alexandria family is aware because of Alexandria previous  bleeding episode.  They have elected not to use warfarin  anticoagulation.      Alexandria Morton. Riley Kill, MD, [facility]  Electronically Signed     TDS/MEDQ  D:  02/22/2008  T:  02/23/2008  Job:  [id number]   cc:   Alexandria Pick. Alexandria Emms, MD, [facility]  [name], M.D.

## 2010-12-28 NOTE — Assessment & Plan Note (Signed)
[facility]                            CARDIOLOGY OFFICE NOTE   [name], [name]                    MRN:          [id number]  DATE:03/10/2008                            DOB:          29-Apr-1935    [name] returns for followup.  She was just discharged from the hospital on  February 26, 2008.  She had recurrent angina and had a stent placed to her  LAD.  She had a previous stent to circumflex artery.  I believe the  patient had a little bit of bruising after her cath with no  pseudoaneurysm.  She is in chronic atrial fibrillation with ventricular  pacing.   She is currently still under a lot of stress.  Her son is living with  her along with the 68-year-old granddaughter.  [name] used to living by  herself.  She is under a lot of stress from having the son and child  there.  She had previously been on Prozac.  She thinks she is depressed.  She is crying a lot.  She is trying to make an appointment to see Dr.  Clarene Duke, but clearly needs to be on an antidepressant.   REVIEW OF SYSTEMS:  Remarkable for no significant palpitations.  She is  having some epigastric pain and it is hard to tell whether it is angina  or not.  I think it is related to her stress.  I have told her to take  some antacids to see if this helps and if not to take her nitro.  I will  reevaluate this in 3-4 weeks.  Otherwise, her review of systems is  negative.  She is having increased lower extremity edema as well.  Her  Lasix had been increased to 1-1/2 tablets once a day.   CURRENT MEDICATIONS:  1. Amlodipine 5 mg a day.  2. Lisinopril 40 a day.  3. Warfarin as directed.  4. Actonel 35 a day.  5. Klor-Con 20 b.i.d.  6. Simvastatin 40 a day.  7. An aspirin a day.  8. Lasix 40 mg 1-1/2 tablets a day.  9. Pantoprazole 40 a day.  10.Iron.  11.Metoprolol 75 b.i.d.  12.Plavix 75 a day.   PHYSICAL EXAMINATION:  GENERAL:  Her exam is remarkable for elderly  white female in no  distress.  VITAL SIGNS:  Weight is 190, blood pressure 150/88, pulse 78 with V  pacing, respiratory rate 14, and afebrile.  HEENT:  Unremarkable.  NECK:  Carotids normal without bruit.  No lymphadenopathy, thyromegaly,  or JVP elevation.  LUNGS:  Clear.  Good diaphragmatic motion.  No wheezing.  The patient in  good position on the left clavicle with minor bruising.  HEART:  S1 and S2 with normal heart sounds.  PMI normal.  ABDOMEN:  Benign.  Bowel sounds positive.  No AAA, no epigastric pain,  no tenderness, no hepatosplenomegaly, no hepatojugular reflux.  EXTREMITIES:  Distal pulses are intact with bilateral varicosities, left  greater than right and +1 edema bilaterally.   EKG shows V pacing with AFib.   IMPRESSION:  1. Coronary artery disease, recent stent  to the left anterior      descending artery.  Continue aspirin and Plavix.  Epigastric pain      sounds noncardiac.  See what her response to nitroglycerine is.      Follow up in 4 weeks.  2. Hypertension, currently well controlled.  Continue current dose of      lisinopril and amlodipine.  3. Lower extremity edema.  Increase Lasix to b.i.d.  Continue      potassium supplementation.  Not at all secondary to heart, some of      it due to lower extremity varicosities.  4. Depression.  Prozac 20 mg a day will be called in the [employer] in      [place].  Follow up with Dr. Clarene Duke.  5. Epigastric pain, question related to her to gastroesophageal reflux      disease or reflux.  Continue pantoprazole 40 a day.   I will see [name] back in 3 to 4 weeks.  We will see how her epigastric  pain and edema are doing.  She has had a lot of hospitalizations  recently and I would like to avoid another heart cath since we had such  a nice result with a stent to her LAD.     [name]. [name], MD, [facility]  Electronically Signed    PCN/MedQ  DD: 03/10/2008  DT: 03/11/2008  Job #: [id number]

## 2010-12-28 NOTE — Discharge Summary (Signed)
NAMEPERCILLA, TWETEN             ACCOUNT NO.:  [id number]   MEDICAL RECORD NO.:  [id number]          PATIENT TYPE:  INP   LOCATION:  4732                         FACILITY:  [facility]   PHYSICIAN:  Madolyn Frieze. Jens Som, MD, FACCDATE OF BIRTH:  07-04-1935   DATE OF ADMISSION:  02/05/2007  DATE OF DISCHARGE:  02/14/2007                               DISCHARGE SUMMARY   PRIMARY CARDIOLOGIST:  Dr. Maurine Cane.   PRIMARY CARE PHYSICIAN:  Dr. [name] in [place], [place].   ELECTROPHYSIOLOGIST:  Dr. Lewayne Bunting.   GASTROENTEROLOGY:  Will be Dr. Rob Bunting.   DISCHARGING DIAGNOSES:  1. Recurrent rapid atrial fibrillation with sick sinus syndrome,      status post electrophysiology consult this admission.  2. Status post implantation of a dual-chamber pacemaker (Medtronic      dual-chamber device) secondary to symptomatic tachybrady syndrome      and atrial fibrillation.  3. Status post atrioventricular node ablation this admission.  4. Anemia/gastrointestinal bleed secondary to ulcers in the setting of      over-the-counter nonsteroidal antiinflammatory use in the setting      of ongoing anticoagulation therapy with aspirin, Plavix and      Coumadin with supratherapeutic INR this admission.  On admission,      INR greater than 10.2, PT greater than 90.  5. Hypertension.   PAST MEDICAL HISTORY:  1. Depression.  2. Tobacco use.  3. GERD.  4. Hypertension.  5. Renal insufficiency with creatinine of 2.  6. Cholecystectomy.  7. Hysterectomy.  8. Appendectomy.  9. History of paroxysmal atrial fibrillation with anticoagulation      therapy status post cardioversion approximately 2 weeks prior to      discharge for the atrial fibrillation; however, the patient      remained in sinus rhythm for only a brief period time.  10.Coronary artery disease, prior OM stenting with near-normal left      ventricular function.  11.Congestive heart failure secondary to rapid atrial  fibrillation      (acute-on-chronic diastolic).  12.Hypertension.   CONSULTS THIS ADMISSION:  1. Dr. Berton Mount for electrophysiology February 05, 2007.  2. Dr. Christella Hartigan with [facility] a on February 06, 2007.   PROCEDURES THIS ADMISSION:  1. Implantation of a dual-chamber Medtronic pacemaker on February 09, 2007.  2. EGD by Dr. Christella Hartigan on February 07, 2007.  EGD results showing antral      ulcers, likely NSAID/aspirin related.  Biopsies taken to check for      H. pylori.  Very elevated INR contributed to the amount of blood      loss.  Ulcers are most likely source of the patient's melena and      anemia.  3. AV node ablation on February 13, 2007, by Dr. Lewayne Bunting.   HOSPITAL COURSE:  Ms. Heuberger is a 76 year old Caucasian female  followed by Dr. Maurine Cane who was seen by Dr. [name] on February 05, 2007,  as an add-on in the office.  The patient complained of palpitations with  increased shortness of breath.  Patient  with known history of paroxysmal  atrial fibrillation.  The patient with previous history of attempted  DCCVs, previous flecainide therapy with subsequent significant  bradycardia.  The patient very sensitive to beta blockers.  Dr. [name]  felt the patient should be admitted to [facility] for further  evaluation, as her INR was also found to be supratherapeutic.  The  patient would also need an EP evaluation for pacemaker and AV node  ablation.  The patient was admitted to [facility], found to have a hemoglobin  of 6.6 and hematocrit of 19.9 with a PT/INR of 90 and greater than 10.2  INR on day of admission.  The patient received 2 units of fresh frozen  plasma and 2 units of packed rbc's.  Cardioversion was cancelled.  Her  Lopressor was titrated to improve rate.  The patient was made n.p.o. On  June 24, hemoglobin 8.4.  The patient was transfused with total of two  additional units of packed rbc's for a total of 4.  PT was repeated with  INR 3.5.  GI was asked to consult.  They saw the  patient in consultation  with plans for EGD as stated above.  On June 25, hemoglobin 10.6, PT/INR  3.7.  The patient was given a unit of fresh frozen plasma.  Lasix,  Plavix, Coumadin were discontinued.  Continued to manage rate with  Lopressor.  The patient to endoscopy laboratory, results as stated  above.  The patient tolerated the procedure without complications.  Proceed with EP evaluation and permanent pacemaker after GI workup.  The  patient to the procedure room for pacemaker implantation by Dr. Ladona Ridgel.  The patient tolerated the procedure without complications.  Continued to  follow the patient's PT/INR and CBC closely.  On June 26, hemoglobin  10.4, INR 2.4.  On July 1, it was decided to proceed with the AV node  ablation.  The patient to the catheterization laboratory by Dr. Ladona Ridgel,  tolerated the procedure without complications.  Medtronic device  interrogated and reprogrammed.  Also on July 1, Dr. Jens Som in to  examine and assess the patient.  Afebrile, blood pressure 140/79, heart  rate 83, saturation 98% on room air.  Right groin post ablation site  stable.  The patient being discharged home.  CBC obtained prior to  discharge:  H&H stable at 10.4 and 31.1; platelet count 182,000.   At time of discharge, medication list has been narrowed down.  The  patient has been asked to hold the following medicines until she follows  up with Dr. [name]:  No aspirin, Plavix, Coumadin, Lasix, potassium,  clonidine or lisinopril.  No Aleve, Motrin, ibuprofen/NSAIDs.  The  patient can continue her antidepressant.  She was on either Lexapro or  Prozac; the patient cannot confirm which one.  She will check her  medications when she gets home.  According to the medication sheet from  home she is on Prozac 20 mg daily.  She will also continue her Xanax as  previously prescribed.  New medications include Protonix 40 mg daily.  I  have given her a prescription for this and Lopressor 50 mg p.o.  b.i.d.   She has a follow-up appointment with Dr. [name] July 14 at 4:15 p.m. at  which time she will need a CBC and PT/INR redrawn; Dr. Ladona Ridgel at the  pacer clinic July 16 at 10:40 a.m.  She has a follow-up appointment with  [facility] on August 8 at 1 p.m., and Dr. Christella Hartigan  scheduled her for EGD  on August 22 at 9:30.  The patient has  been given the post pacemaker discharge instructions along with heart  failure education and weight chart and smoking cessation instructions.   Duration of discharge encounter is 60 minutes.      Dorian Pod, ACNP      Madolyn Frieze. Jens Som, MD, [facility]  Electronically Signed    MB/MEDQ  D:  02/14/2007  T:  02/14/2007  Job:  [id number]   cc:   Bryon Lions, MD

## 2010-12-28 NOTE — Discharge Summary (Signed)
[name], [name]             ACCOUNT NO.:  [id number]   MEDICAL RECORD NO.:  [id number]          PATIENT TYPE:  INP   LOCATION:  2015                         FACILITY:  [facility]   PHYSICIAN:  [name]. [name], MD, FACCDATE OF BIRTH:  [birth year]   DATE OF ADMISSION:  02/26/2007  DATE OF DISCHARGE:  03/03/2007                               DISCHARGE SUMMARY   SUMMARY OF HISTORY:  [name] is a 76 year old female who was  recently discharged from the hospital on July 2 with a GI bleed and  anemia requiring transfusion.  This was also complicated by atrial  fibrillation, for which she underwent ablation and pacemaker insertion.  She presented to the office on July 14 to Dr. [name] with increasing  shortness of breath, lower extremity edema, PND and orthopnea.  Thus,  her admission.   PAST MEDICAL HISTORY:  1. Sick sinus syndrome with PAF.  2. Atrial fibrillation.  3. Coronary artery disease with stenting to the OM.  4. Pacemaker ablation.  5. Anemia with GI bleed secondary to ulcers in the setting of      nonsteroidal use for arthritis.  6. Hypertension.  7. Minor chronic renal insufficiency.  8. Prior tobacco use.  9. Depression.  10.GERD.   LABORATORY:  Admission H&H is 10.8 and 32.6, normal indices.  Platelets  212, WBC 3.3.  Prior to discharge on the 19th H&H was 9.6 and 28.5,  normal indices, platelets 155, WBC 4.3.  Admission PTT was 29, PT 13.6.  prior to discharge PT was 37.7, INR 2.5.  Admission sodium was 141,  potassium 3.3, BUN 33, creatinine 2.0, glucose 99.  Prior to discharge  sodium was 137, potassium 3.5, BUN 37, creatinine 2.38, glucose 121.  She had normal LFTs on the 18th.  Amylase and lipase on the 18th were 30  and 22, respectively.  Iron studies on the 18th showed an iron level low  at 19, TIBC 335, percent saturation low at 6, ferritin 29.  Parathyroid  was high at 179.2, calcium total PTH was 8.7.  Urinary creatinine was  93.2.  Urine sodium was  21.  Urinalysis on the 17th was unremarkable.  However, on the 18th this was rechecked and showed a large amount of  bacteria, moderate blood, protein and leukocytes.  Urine culture on the  17th was positive for E. coli.  Chest x-ray on the 14th showed mild CHF  with bibasilar edema, atelectasis and effusion.  Chest x-ray on the 17th  showed decreasing pleural effusions and bibasilar atelectasis.  Abdominal ultrasound showed severe polycystic kidney disease with cysts  in the liver as well, bilateral effusions.  EKGs showed atrial  fibrillation with a controlled ventricular rate.  Echocardiogram showed  EF of 55-60%, mild LVH, paradoxical interventricular septal motion, mild  aortic valve thickness, moderate MR with eccentric jet, left atrial  enlargement, minimal pericardial effusion versus epicardial adipose  tissue.   HOSPITAL COURSE:  Dr. [name] admitted [name] and placed her on IV  Lasix and changed her Lopressor to 50 mg b.i.d.  Antiarrhythmics were  discussed with Dr. Ladona Ridgel.  Heparin and  Coumadin were resumed while  following her hemoglobin.  Pharmacy assisted with management.  After  discussing with Dr. Ladona Ridgel, Dr. [name] started the patient on 400 mg of  amiodarone b.i.d. with anticipation of TEE cardioversion on Friday.  Echocardiogram was performed as previously described.  On July 16 the  patient was doing well.  Telemetry continued to show V-pacing.  Robitussin-DM was prescribed for a cough and renal was asked to evaluate  the patient secondary to increased creatinine.  By the 18th she was not  having any discomfort.  She was hungry and was feeling much better.  ACE  inhibitor remained on hold.  An abdominal ultrasound was performed to  further evaluate her nausea and vomiting.  On the 18th Dr. [name]  performed DC cardioversion with TEE.  This did not show any evidence of  thrombus, EF of 68%, and restored normal sinus rhythm.  Pacemaker was  reprogrammed.  It was  noted that she is pacer-dependent.  On the 19th  renal felt that she could be discharged from their standpoint.  They  recommended Cipro 500 mg daily for her urinary tract infection.  Dr.  [name] after evaluation agreed and felt that the patient could be  discharged home.   DISCHARGE DIAGNOSES:  1. Chronic acute diastolic congestive heart failure.  2. Chronic kidney disease, stage IV.  3. Atrial fibrillation.  4. Status post transesophageal echocardiogram cardioversion restoring      normal sinus rhythm.  5. Hypokalemia.  6. Normocytic anemia, iron-deficiency.  7. Hypertension.  8. Increased BUN and creatinine with lisinopril on hold.  9. Reinitiation of anticoagulation.  10.History is noted below.   PROCEDURES PERFORMED:  TEE cardioversion by Dr. [name] on March 02, 2007.   DISPOSITION:  The patient is discharged home.  Asked to maintain low-  sodium, heart-healthy renal diet.  Activities were not restricted.  She  was asked to follow up with Dr. [name] in 2 weeks  and to keep  appointment with Dr. Salena Saner.   She received new prescriptions for Cipro 500 mg daily for 5 days and  amiodarone 200 mg b.i.d.  Her Lasix was increased to 40 mg daily and her  Lopressor was decreased to 50 mg b.i.d.  She was asked to continue  Coumadin 5 mg half a tablet daily except for 1 tablet on Tuesday (this  was decreased as she is going home on Cipro).  Imdur 30 mg daily,  aspirin 81 mg daily, Zantac 150 mg daily, Klor-Con 40 mEq daily, Lexapro  10 mg daily, Xanax p.r.n., Actonel as previously.  She was advised not  to take her lisinopril, to bring all medications to all appointments.  She was asked to call Dr. [name] to arrange a 2-week follow-up  appointment and to keep her appointment with Dr. Salena Saner.   DISCHARGE TIME:  35 minutes.      Joellyn Rued, PA-C      [name]. [name], MD, [facility]  Electronically Signed    EW/MEDQ  D:  03/03/2007  T:  03/03/2007  Job:  [id number]   cc:   Terrial Rhodes,  M.D.

## 2010-12-28 NOTE — Consult Note (Signed)
**Note [name]-Identified via Obfuscation** NAMELANGSTON, [name]             ACCOUNT NO.:  [id number]   MEDICAL RECORD NO.:  [id number]          PATIENT TYPE:  INP   LOCATION:  4703                         FACILITY:  [facility]   PHYSICIAN:  [name]. [name], MD, FACCDATE OF BIRTH:  [birth year]   DATE OF CONSULTATION:  02/19/2008  DATE OF DISCHARGE:                                 CONSULTATION   [name] is a delightful 76 year old patient of [name]. [name]  and myself.  She is admitted to the emergency room with chest pain.   She has a history of coronary artery disease with a previous stent to  the OM.  Her last heart catheterization on October 31, 2006, showed the  stent to be patent.  She did have an ostial septal perforator lesion  which was not thought to be clinically significant and not amenable to  angioplasty.   She has had some diastolic function in the past, but has normal LV  function.   She has chronic left-sided atrial flutter with sick sinus syndrome and  has had a pacemaker placed.   She is currently not on Coumadin due to a previous GI bleed.   The patient has had 2 weeks of what sounds like classic angina.  She has  been having exertional chest pain.  There is an aching in her chest.  It  can radiate to her arm and shoulder.  She has been taking multiple  nitroglycerins.   She does have a history of chronic renal failure.  Her creatinine is 10  to run about 2.2 days to be worse.  She had a fistula placed I believe  last year by Dr. Madilyn Fireman and left upper extremity unfortunately thrombosed  and is not functional.  She has not required dialysis yet.  I had a long  discussion with Osmara and her son.  She has been under a little bit of  stress lately.  The son  is being separated and is now living with her  granddaughter.  He is less than year old, is a very active and Shontavia is  used to living by herself; however, that being said, her symptoms sound  like classic angina and given her known coronary disease,  I think she  needs to have a heart cath.  There is significant risk to her kidneys.  She understands this.  I think we can do a diagnostic angiogram with  limited dye when I last cath her March, we used less than 30 mL of  contrast.  The patient will have a right heart cath as well as cores  only.   She is currently painfree.  She does not like to take sublingual nitro  because of a headache, but she has been doing quite frequently over the  last 2 weeks.  Her review of system is otherwise negative.   Her past medical history is remarkable for hyperthyroidism.  She had a  bad reaction to PTU.  She has a history of atrial flutter not on  Coumadin due to a GI bleed, previous AV nodal ablation with pacemaker by  Dr. Ladona Ridgel, history  of diastolic heart failure, history of chronic renal  insufficiency, creatinine is running about 2.2.   The patient has a history of hypertension and also moderate-to-severe  mitral insufficiency.   Her review of system is otherwise negative.   She is allergic to BTU.   MEDICATIONS:  Her medications on admission included.  1. Aspirin a day.  2. Lisinopril 48.  3. Actonel 30 weekly.  4. Potassium 20 b.i.d.  5. Simvastatin 40 a day.  6. Aspirin 81 mg a day.  7. Lasix 40 mg half a tablet daily.  8. Metoprolol 75 b.i.d.  9. Xanax p.r.n.   Family history is noncontributory.   The patient lives by herself.  She is widowed.  Her son recently moved  in with her.  She is active.  She does all activities of daily living  and does not smoke or drink.   PHYSICAL EXAMINATION:  VITAL SIGNS:  Her exam is remarkable for being  ventricularly paced at a rate of 61, blood pressure is 130/70,  respiratory 14, afebrile.  GENERAL:  Affect appropriate.  HEENT:  Unremarkable.  CAROTIDS:  Without bruit.  No lymphadenopathy, thyromegaly, JVP  elevation.  LUNGS:  Clear with diaphragmatic motion.  No wheezing.  HEART:  S1 and S2 with a systolic murmur at the apex.  The  pacer is on  the left clavicle in good position.  PMI is normal.  ABDOMEN:  Benign,  bowel sounds positive.  No AAA.  No tenderness, no bruit.  No  hepatosplenomegaly, or hepatojugular reflux.  Distal pulses are intact,  there is +1 edema bilaterally.  NEURO:  Nonfocal.  SKIN:  Warm and dry.  No muscular weakness.   EKG shows atrial flutter with ventricular pacing not interpretable for  ischemia.  Chest x-ray shows pacemaker wires in good position with no  acute pulmonary process and no significant cardiomegaly.   Lab work so far is remarkable for negative point care enzymes, CBC shows  a hematocrit of 42.4 and i-STAT shows a potassium of 4.1 and a  creatinine of 1.9, with a BUN of 30.   IMPRESSION:  Chest pain highly suggestive angina in the setting of known  coronary disease and a previous stent to the OM.  The patient will be  started on heparin and loaded with Plavix.  She will continue her  aspirin, nitro will be given as needed.  Her heart rate is already quite  low.  She has pacemaker backup and we will continue her Lopressor at a  slightly lower dose of 50 b.i.d.  She will be hydrated overnight.  She  will be referred from right heart cath and cores only diagnostic  angiogram tomorrow.  She will be ruled out for myocardial infarction.  Echocardiogram may be obtained prior to cath if possible to assess MR  and LV function.  1. 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  2. History of chronic renal failure.  The patient will be hydrated      with normal saline.  We will hold her Lasix and ACE inhibitor.  She      understands the risk of acute on chronic renal failure with her      cath and also the possibility of temporary dialysis.  If her      creatinine were to bump significantly we will have renal involved      they know her.  Unfortunately her fistula is nonfunctional and if      her creatinine  really worsens, she would need a temporary catheter.  3. History of GI bleed.  This has been quiescent.  We will guaiac her      stools.  Her hematocrit is stable.  We will follow her hematocrit      while on heparin, aspirin, and Plavix.   The plan was discussed at length with Courtne and she is willing to  proceed.      [name]. [name], MD, [facility]  Electronically Signed     PCN/MEDQ  D:  02/19/2008  T:  02/20/2008  Job:  [id number]

## 2010-12-28 NOTE — Assessment & Plan Note (Signed)
[facility]                            CARDIOLOGY OFFICE NOTE   [name], [name]                    MRN:          [id number]  DATE:05/11/2007                            DOB:          30-Jun-1935    [name] returns today for follow-up.  She has had quite a time of it with  her atrial fibrillation and flutter.  She has failed previous  cardioversions x2.  She subsequently was placed on amiodarone.  She  continued to have PAF with difficult to control rates.  She had a GI  bleed on Coumadin.  After discussing with EP we decided to ablate her AV  node and place a pacemaker.  Since hospital discharge she has had a  horrendous rash.  Dr. Clarene Duke has been working with this.  Her only new  medication has been PTU.  I do not recall her being diagnosed with  hyperthyroidism recently, but apparently she has.  It is not clear to me  that the PTU is causing a rash.  She actually was in the emergency room  Saturday for it.  Initially there was a question of whether it was  impetigo and she was treated with antibiotics.   The patient then was told it may be shingles.   She has not had any palpitations, PND, or orthopnea.  She has not had  chest pain.  She has had diastolic heart failure in the past and is  currently euvolemic.   We have not restarted her Coumadin since she had a GI bleed and has this  horrendous rash.  I would like to wait another four weeks.   REVIEW OF SYSTEMS:  Otherwise negative.   CURRENT MEDICATIONS:  1. Lisinopril 40 a day.  2. Actonel 35 a week.  3. Klor-Con 20 b.i.d.  4. Simvastatin 40 a day.  5. An aspirin a day.  6. Metoprolol 50 b.i.d.  7. Furosemide 40 a day.  8. Pantoprazole 40 a day.  9. Doxycycline 100 b.i.d. currently.   PHYSICAL EXAMINATION:  Her exam is remarkable for an elderly white  female in no distress.  Her pulse is 60 and paced.  Blood pressure is  120/70, respiratory rate is 14.  She is afebrile.  She has a  generalized body rash, particularly under her arms and torso.  SKIN:  There are reddish papules with some crusted lesions.  It is not  typical of a Zoster dermatome.  They are currently not weeping.  HEENT:  Unremarkable.  NECK:  Thyroid is not palpable.  No lymphadenopathy, no thyromegaly, no  JVP elevations.  LUNGS:  Clear to diaphragmatic motion, no wheezing.  HEART:  Her pacemaker has slid a bit under her left collarbone and is  actually somewhat low and difficult to palpate.  There is an S1/S2 soft  systolic murmur.  ABDOMEN:  Benign, bowel sounds positive.  No tenderness, no  hepatosplenomegaly, no hepatojugular reflux.  EXTREMITIES:  Femorals are +2 bilaterally without bruit.  PTs are +2.  There is no lower extremity edema.  Interestingly, the rash has fairly  spared her legs.  NEUROLOGIC:  Nonfocal.  No lymphadenopathy.   EKG shows V pacing with atrial flutter.   IMPRESSION:  1. Recurrent and persistent atrial fibrillation/flutter.  For the time      being will leave her off her Coumadin.  Rate control is good.  She      is euvolemic, not having dyspnea.  I will see her back in four      weeks to make a distinction as to whether to restart her Coumadin.      I would like to have her rash resolved and have at least three      guaiac cards showing negative blood in her stool.  2. Rate control, currently adequate.  She is status post ablation.      She will continue her beta blocker.  3. Antiarrhythmics.  She has been on amiodarone in the past.  I am not      sure that we would be successful in converting her; however, she is      in coarse flutter now.  After these problems are resolved and she      can go back on Coumadin I may refer her back to EP in regards to      possible flutter ablation.  4. Rash.  I will try to get her an appointment with [facility]      [facility]ermatology today.  She will continue her Doxycycline and local      skin care.  5. Hypercholesterolemia.   Continue simvastatin 40 a day.  No evidence      of myalgias.  Check LFTs in six months.  6. History of diastolic heart related to atrial fibrillation.      Currently stable and euvolemic.  Continue current dose of Lasix and      potassium.     [name]. [name], MD, [facility]  Electronically Signed    PCN/MedQ  DD: 05/11/2007  DT: 05/11/2007  Job #: [id number]

## 2010-12-28 NOTE — Op Note (Signed)
NAMESHARMA, [name]             ACCOUNT NO.:  [id number]   MEDICAL RECORD NO.:  [id number]          PATIENT TYPE:  INP   LOCATION:  2040                         FACILITY:  [facility]   PHYSICIAN:  Dyke Brackett, M.D.    DATE OF BIRTH:  02-27-1935   DATE OF PROCEDURE:  02/27/2009  DATE OF DISCHARGE:                               OPERATIVE REPORT   INDICATIONS:  A 76 year old with severe valgus arthrosis of the left  knee thought to be amenable to hospitalization and total knee  replacement.   PREOPERATIVE DIAGNOSIS:  Severe osteoarthritis, valgus, almost left  knee.   POSTOPERATIVE DIAGNOSIS:  Severe osteoarthritis, valgus, almost left  knee.   OPERATION:  Left total knee replacement (LCS standard femur, standard  patella, 3 peg metal back with size 3 tibia with 15-mm bearing).   SURGEON:  Dyke Brackett, MD   ASSISTANT:  PA   TOURNIQUET TIME:  67 minutes.   DESCRIPTION OF PROCEDURE:  Sterilely prepped and draped, exsanguination  of the leg with elevation of tourniquet to 375.  Straight incision,  medial parapatellar approach to the knee made, cutting the most diseased  compartment approximately 3-4 mm below the most diseased lateral side  followed by an anterior-posterior femoral cut with flexion gap being 15  mm.  This was matched by 4-degree distal valgus cut with a flexion gap  matched at 15.  Chamfer cuts were made in the keel hole for the  prosthesis.  Attention was then directed to the tibia.  Plate released,  so the PCL was carried out with resection of the menisci.  The tibia was  sized to a size 3 tibia with a keel hole cut with the tibial prosthesis  without difficulty.  Trial was placed with tibia and femur with 15-mm  bearing and leaving about 14-15 native patella after cutting the patella  with a guide.  Trial reduction of all 3 components was carried out, full  extension.  She had applied slight flexion contracture preoperatively.  This was negated after the  placement of the trial followed by correction  of the valgus noted as well.  Full extension.  No instability to bearing  and good stability of the collateral ligaments.   The components were cemented in tibia followed by femur and the cement  allowed to harden.  Excess cement removed.  Trial bearing was placed.  It was removed.  Excess cement was removed from the posterior aspect of  the  knee.  Tourniquet was released to allow assessment of the posterior  aspect.  No excessive bleeding was noted.  Hemovac drain was placed,  exiting superolaterally, closed with #1 Ethibond, 2-0 Vicryl, and skin  clips.  Taken to recovery room in stable condition.  Lightly compressive  sterile dressing applied.       Dyke Brackett, M.D.  Electronically Signed     WDC/MEDQ  D:  02/27/2009  T:  02/27/2009  Job:  [id number]

## 2010-12-28 NOTE — Assessment & Plan Note (Signed)
[facility]                         ELECTROPHYSIOLOGY OFFICE NOTE   SHIELA, [name]                    MRN:          [id number]  DATE:06/26/2007                            DOB:          December 06, 1934    HISTORY OF PRESENT ILLNESS:  [name] is referred back today by Dr.  Charlton Haws for additional evaluation of shortness of breath and  consideration for treatment of her atrial fibrillation/flutter.  The  patient is a very pleasant 76 year old woman with a history of atrial  fibrillation who underwent AV node ablation and pacemaker back in June.  She unfortunately has had continued problems with shortness of breath,  fatigue and weakness.  Her problems are multifactorial including  hypertension, near end-stage renal disease (category IV kidney disease),  and since we last saw her, she complains of dyspnea walking across the  street or doing any significant exertion.  Her heart failure symptoms  are class III.  This is despite having preserved LV function and fairly  moderate to severe mitral regurgitation.  Her paced QRS duration  unfortunately is 170 milliseconds.  Her underlying rhythm is coarse  atrial fibrillation versus left atrial flutter, I suspect the latter.   PHYSICAL EXAMINATION:  GENERAL:  She is a pleasant, elderly appearing  woman in no acute distress.  VITAL SIGNS:  Blood pressure today was 150/84, pulse 60 and regular,  respirations 18, weight 171 pounds.  NECK:  An 8 cm jugular venous distention.  No thyromegaly.  Trachea was  midline.  Carotids 2+ and symmetric.  LUNGS:  Clear bilaterally to auscultation.  No wheezes, rales or rhonchi  present.  No increased work of breathing.  CARDIOVASCULAR:  Regular rate and rhythm with normal S1 and split S2.  EXTREMITIES:  Demonstrated trace peripheral edema bilaterally.   IMPRESSION:  1. Persistent atrial fibrillation/flutter.  2. Hypertension.  3. Near but not quite end-stage  kidney disease.  4. Congestive heart failure class III with predominance of diastolic      dysfunction and mitral regurgitation.  5. Intolerance to amiodarone.  6. Hypertension.  7. Dyslipidemia.   DISPOSITION:  [name] congestive heart failure symptoms are  certainly multifactorial with volume overload playing a role.  However,  she clearly has a very wide paced QRS, and because of her renal failure  and because of her inability to take Amiodarone, there is no good  medical option to control her atrial arrhythmias and maintain sinus  rhythm.  To this end, I have recommended that we consider upgrade to a  biventricular pacemaker to see if we can help  improve her shortness of breath.  We will plan on scheduling this at the  earliest possible convenient time.     Doylene Canning. Ladona Ridgel, MD  Electronically Signed    GWT/MedQ  DD: 06/26/2007  DT: 06/27/2007  Job #: [id number]   cc:   Terrial Rhodes, M.D.

## 2010-12-28 NOTE — Assessment & Plan Note (Signed)
[facility]                            CARDIOLOGY OFFICE NOTE   [name], [name]                    MRN:          [id number]  DATE:03/28/2007                            DOB:          1934/08/28    SUBJECTIVE:  This is a 76 year old white female patient of Dr. Theron Arista  Nishan's who was just put in the hospital for recurrent atrial  fibrillation.  She recently had a GI bleed requiring transfusions  complicated by atrial fibrillation and underwent ablation and pacemaker  insertion.  She went back into atrial fibrillation and had heart failure  secondary to diastolic dysfunction and was diuresed.  She underwent TEE-  guided cardioversion.  There was no thrombus.  Ejection fraction was 68%  and she was restored to normal sinus rhythm and pacemaker was  reprogrammed.  She is pacer dependent.  She also developed renal  insufficiency and was placed on Cipro for a urinary tract infection and  will be followed by renal as an outpatient.   Since she has been home she is doing well.  She does have some dyspnea  on exertion if she walks a long ways but she denies any palpitations,  orthopnea, paroxysmal nocturnal dyspnea, dizziness or presyncope.  She  denies any black stools and feels like her anemia is stable.  She just  had blood work last week by Dr. Clarene Duke for this but she does not know if  her renal profile was drawn as well.   CURRENT MEDICATIONS:  1. Warfarin as directed.  2. Lisinopril 40 mg daily.  3. Actonel 35 mg weekly.  4. Klor-Con 20 mEq b.i.d.  5. Alprazolam 5 mg t.i.d.  6. Simvastatin 40 mg q.h.s.  7. Aspirin 81 mg daily.  8. Fluoxetine 20 mg daily.  9. Metoprolol 50 mg b.i.d.  10.Furosemide 40 mg daily.  11.Pantoprazole 40 mg daily.  12.Pacerone 200 mg b.i.d.   PHYSICAL EXAMINATION:  GENERAL APPEARANCE:  This is a pleasant 72-year-  old white female in no acute distress.  VITAL SIGNS:  Blood pressure initially was 159/88.  When I  retook, it  was 120/80.  Pulse 60.  Weight 184.  NECK:  Is without JVD, HJR, bruits or thyroid enlargement.  LUNGS:  Clear anteriorly, posteriorly and laterally.  HEART:  Regular rate and rhythm at 62 beats per minute.  Normal S1 and  S2 with a 2/6 systolic ejection murmur at the left sternal border.  ABDOMEN:  Soft without organomegaly, masses, lesions or abnormal  tenderness.  EXTREMITIES:  Trace of edema bilaterally in the ankles.  Positive distal  pulses.   CLINICAL DATA:  EKG looks like normal sinus rhythm, ventricular paced,  with some magnet AV pacing.   IMPRESSION:  1. Recurrent atrial fibrillation, status post TEE-guided cardioversion      with amiodarone.  2. Congestive heart failure secondary to diastolic dysfunction,      stable.  3. Recent gastrointestinal bleed requiring transfusion.  4. Status post pacemaker ablation.  5. Chronic kidney disease, stage 4.  6. Hypertension.  7. Coumadin therapy.  8. History of depression.  9. Gastroesophageal  reflux disease.   PLAN:  At this time, the patient is doing quite well from a cardiac  standpoint.  I will not make any medication changes.  I have asked that  she send [place] her blood work from Dr. Fredirick Maudlin.  She is due to have an  endoscopy at the end of this month as well as followup with renal.  She  will see Dr. [name] back in one to two months and she will have her INR  checked today.      Jacolyn Reedy, PA-C  Electronically Signed      Doylene Canning. Ladona Ridgel, MD  Electronically Signed   ML/MedQ  DD: 03/28/2007  DT: 03/29/2007  Job #: [id number]   cc:   Dr. Wilford Sports, [place]  Terrial Rhodes, M.D.

## 2010-12-28 NOTE — Assessment & Plan Note (Signed)
OFFICE VISIT   [name], [name]  DOB:  June 22, 1935                                       09/24/2007  AVWUJ#:[id number]   HISTORY:  This is a 76 year old female seen at the request of Dr.  Arrie Aran for evaluation of fistula.  The patient is not yet on  dialysis.  She is right-handed.  She has a pacemaker access to the left  side.  She has no history of blood clots.  She does not have any upper  arm swelling.   REVIEW OF SYSTEMS:  GENERAL:  Positive for weight loss.  She weighs 170  pounds.  CARDIAC:  Positive for murmur and atrial fibrillation.  PULMONARY:  Negative.  GI:  History of bleeding ulcer back in June.  GU:  Positive for kidney disease.  VASCULAR:  Pain in the legs with walking as well as with lying flat.  NEUROLOGICAL:  Positive for dizziness and headaches.  ORTHO:  Positive for arthritis, joint pain, muscle pain or rash.  PSYCHIATRIC:  Positive for depression and nervousness.  ENT:  Recent change in eyesight and hearing.  HEME:  Positive for bleeding and anemia.   PAST MEDICAL HISTORY:  Hypertension, previous heart attack, congestive  heart failure, bleeding ulcer.   PAST SURGICAL HISTORY:  A pacemaker, total abdominal hysterectomy,  appendectomy, cholecystectomy.   MEDICATIONS:  1. Lisinopril 40 mg per day.  2. Potassium 20 mEq once daily.  3. Aspirin 81 mg per day.  4. Iron sulfate 325 daily.  5. Ambien 10 mg daily at bedtime.  6. Simvastatin 40 mg daily at bedtime.  7. Pantoprazole 40 mg per day.  8. Metoprolol 50 mg per day.  9. Lasix 40 mg per day.  10.Actonel 35 mg once a day.   ALLERGIES:  Thyroid ? medicine.   PHYSICAL EXAMINATION:  Vital signs:  Heart rate 68, blood pressure is  183/90.  General:  She is well-appearing and in no acute distress.  Cardiovascular:  Regular rhythm.  Pulmonary:  Lungs are clear  bilaterally.  Extremities:  Palpable right brachial pulse.  Extremities  are warm and well perfused.   Psychiatric:  She is alert and oriented x3.  Neurological:  Cranial nerves II-XII are grossly intact.   DIAGNOSTIC STUDIES:  Vein mapping was performed of both upper  extremities today.  Her left arm is not a candidate for fistula.  Her  right arm reveals a cephalic vein which measures 0.18 at the antecubital  crease.  Proximal to that it gets to 0.24 and up to 0.34.   ASSESSMENT:  Need for fistula for dialysis access.   PLAN:  Dr. Arrie Aran has requested that only an arteriovenous fistula  be placed.  I have explained to the patient that her distal cephalic  vein is of marginal diameter for fistula.  I told the patient that we  could attempt to explore the vein and see if it dilated in the operating  room.  If it did we would proceed with a right upper arm cephalic vein  fistula.  If the vein does not dilate or I feel is not optimal for a  fistula we would stop at this time given that Dr. Arrie Aran does not  wish for [place] to place a graft.  I will also speak with him further  regarding this matter.  I have no  problem exploring this vein to see if  it is accessible.  As of now, I will plan to only perform a fistula and  not place a graft should the vein not be of adequate diameter.  This  will be scheduled for within the next 1-2 weeks.   Jorge Ny, MD  Electronically Signed   VWB/MEDQ  D:  09/24/2007  T:  09/26/2007  Job:  [id number]   cc:   Terrial Rhodes, M.D.

## 2010-12-28 NOTE — Op Note (Signed)
NAMESHEQUILLA, GOODGAME             ACCOUNT NO.:  [id number]   MEDICAL RECORD NO.:  [id number]          PATIENT TYPE:  AMB   LOCATION:  SDS                          FACILITY:  [facility]   PHYSICIAN:  Balinda Quails, M.D.    DATE OF BIRTH:  1935-04-14   DATE OF PROCEDURE:  10/12/2007  DATE OF DISCHARGE:  10/12/2007                               OPERATIVE REPORT   SURGEON:  Denman George, MD   ASSISTANT:  Gae Bon, PA.   ANESTHETIC:  Local with MAC.   ANESTHESIOLOGIST:  Edwards.   PREOPERATIVE DIAGNOSIS:  Chronic renal insufficiency.   POSTOPERATIVE DIAGNOSIS:  Chronic renal insufficiency.   PROCEDURE:  Creation of right brachiocephalic arteriovenous fistula.   OPERATIVE PROCEDURE:  The patient brought to the operating room in  stable condition.  Placed in a supine position, right arm prepped and  draped in a sterile fashion.  Skin and subcutaneous tissues instilled  with 1% Xylocaine with epinephrine.  Transverse skin incision made  through the right antecubital fossa.  The right cephalic vein  identified.  This was mobilized with tributaries ligated with 4-0 silk  and divided.  The vein ligated distally with 2-0 silk, divided, and  dilated with heparin saline solution.  A 3 mm vein.  Vein controlled  with a bulldog clamp.  Deep dissection carried down to expose the  brachial artery.  This was freed and encircled with a vessel loop.  The  patient administered 3000 units of heparin eventually.  The brachial  artery controlled proximally and distally with bulldog clamps.  Arteriotomy made.  The vein anastomosed end-to-side to the brachial  artery using running 7-0 Prolene suture.  Clamps were then removed.  Excellent flow present.  Adequate hemostasis obtained.  Sponge and  instrument counts correct.   Subcutaneous tissue closed with running 3-0 Vicryl suture.  Skin closed  with 4-0 running nylon.  Sterile dressings applied.  The patient  tolerated the procedure well.   Transferred to the recovery room in  stable condition.      Balinda Quails, M.D.  Electronically Signed     PGH/MEDQ  D:  10/12/2007  T:  10/13/2007  Job:  [id number]

## 2010-12-28 NOTE — Assessment & Plan Note (Signed)
 HEALTHCARE                         ELECTROPHYSIOLOGY OFFICE NOTE   RESHANDA, LEWEY                    MRN:          [id number]  DATE:07/01/2008                            DOB:          1935/06/09    Ms. Falconi returns today for followup.  She is a very pleasant  elderly obese woman with a history of symptomatic tachy-brady syndrome,  now with complete heart block after AV node ablation.  She is status  post BiV pacemaker insertion.  She returns today for followup.  She  recently notes that she back in August had a coronary stent placed.  She  returns today for followup.  She denies chest pain.  Her dyspnea is  baseline.   CURRENT MEDICATIONS:  1. Lisinopril 40 a day.  2. Amlodipine 10 a day.  3. Plavix 75 a day.  4. Potassium 20 mEq 2 tablets daily.  5. Metoprolol 50 twice a day.  6. Simvastatin 40 a day.  7. Pantoprazole 40 a day.  8. Furosemide 40 twice a day.  9. Actonel 35 once a week.  10.Zolpidem 10 mg as needed at night.   PHYSICAL EXAMINATION:  GENERAL:  She is a pleasant chronically ill  appearing woman in no acute distress.  VITAL SIGNS:  The blood pressure was 130/77, the pulse 64 and regular,  respirations were 18.  The weight was 202 pounds.  This is up 10 pounds  from her visit back in July.  NECK:  7 cm jugular venous distention.  No thyromegaly.  LUNGS:  Clear bilaterally to auscultation.  There are rales in the bases  bilaterally.  There are no wheezes or rhonchi today.  ABDOMINAL:  Obese, nontender, nondistended.  EXTREMITIES:  No edema.   IMPRESSION:  1. Symptomatic bradycardia.  2. Status post pacemaker insertion.  3. Hypertension.  4. Chronic renal insufficiency.   DISCUSSION:  Overall, Ms. Giovanni is stable.  Her pacemaker is working  normally.  She has not yet had to undergo dialysis.  Blood pressure is  reasonably well-controlled.  She is 99% V paced.  We will plan on seeing  the patient back for  pacemaker follow-up in 1 year.     Doylene Canning. Ladona Ridgel, MD  Electronically Signed    GWT/MedQ  DD: 07/01/2008  DT: 07/02/2008  Job #: [id number]

## 2010-12-28 NOTE — Discharge Summary (Signed)
[name], [name]             ACCOUNT NO.:  [id number]   MEDICAL RECORD NO.:  [id number]          PATIENT TYPE:  OBV   LOCATION:  3729                         FACILITY:  [facility]   PHYSICIAN:  Doylene Canning. Ladona Ridgel, MD    DATE OF BIRTH:  1935/07/07   DATE OF ADMISSION:  07/05/2007  DATE OF DISCHARGE:  07/06/2007                               DISCHARGE SUMMARY   INTOLERANCES:  1. AMIODARONE which may have caused thyroiditis.  2. PTU (propylthiouracil) caused rash.   FINAL DIAGNOSIS:  1. Discharging day #1 status post explantation of existing Medtronic      SENSIA dual-chamber pacemaker with implantation of a Medtronic      InSync III dual-chamber pacemaker with CRT-P capability  2. Continued fatigue, weakness after AV node ablation and pacemaker      implantation for tachybrady syndrome.  3. The patient has New York Heart Association Class III chronic      systolic congestive heart failure despite ejection fraction 50-55%      at echocardiogram July 2008.  4. BiV paced QRS 176 to 145 postprocedure.   SECONDARY DIAGNOSES:  1. History of atrial fibrillation and left-sided atrial flutter.      a.     Failed direct current cardioversion x2.      b.     Intolerant of AMIODARONE secondary to the thyroiditis.      c.     Rash with PROPYLTHIOURACIL,      d.     Gastrointestinal bleed on Coumadin therapy for atrial       fibrillation-flutter.      e.     Atrial fibrillation with difficult rate control status post       AV node ablation February 13, 2007, Dr. Ladona Ridgel.      f.     Dual-chamber pacemaker implanted February 09, 2007, for       tachybrady syndrome, a Medtronic SENSIA dual-chamber device.  2. Hypertension.  3. Stage IV end-stage kidney disease.  4. Moderate to severe mitral regurgitation, not surgical candidate.  5. Dyslipidemia.   PROCEDURE:  July 05, 2007, explantation of existing Medtronic SENSIA  dual-chamber pacemaker with implantation of Medtronic InSync III dual-  chamber  pacemaker with implantation of the left ventricular lead through  the coronary sinus, Dr. Lewayne Bunting.  The patient has had no  postprocedural complications including no hematoma, no chest pain, no  significant dysrhythmias.  Her device interrogates well after the  procedure.  Chest x-ray shows that the lead is in the appropriate  position. Mobility of the left arm and incision care have been discussed  with the patient.   BRIEF HISTORY:  [name] is a 76 year old lady who underwent AV  node ablation and pacemaker implantation in June and early July of this  year. She has a history of atrial fibrillation and left-sided atrial  flutter.   Unfortunately, she has had continued problems with shortness of breath,  fatigue and weakness. The patient also has hypertension, near end-stage  kidney disease, and, since her hospitalization in late June-early July,  has dyspnea just walking across a  street or doing any significant  exertion.  Her New York Heart Association congestive heart failure  symptoms are Class III.   The patient does have preserved left ventricular function.  She does  have moderate to severe mitral regurgitation.  Her paced QRS duration is  170 milliseconds, and her underlying rhythm is a course atrial  fibrillation versus a left-sided atrial flutter.  [name]  congestive heart failure symptoms are multifactorial. Volume overload  plays a role; however, she has a wide-based QRS.  She is unable to  tolerate AMIODARONE.  There are no good medical options to control her  atrial arrhythmia and to maintain sinus rhythm.  Because of all of this,  Dr. Ladona Ridgel recommended upgrade of the pacemaker to a biventricular  pacemaker to see if this can help with her symptoms of dyspnea.   HOSPITAL COURSE:  The patient presents electively November 20. She  underwent pacemaker change out with implantation of a BiV pacemaker, a  Medtronic InSync III.  The patient has done well  in the postprocedural  period and discharges on November 21.  She is asked to keep her incision  dry for the next 7 days and sponge bathe until Thursday, November 27.  She will take an antibiotic, Keflex 250 mg 1 tablet 1/2 hour before  breakfast, lunch, dinner, and bedtime for the next 5 days.  She also  goes home on her regular medications which are:  1. Lisinopril 40 mg daily.  2. Fluoxetine 20 mg each evening.  3. Tramadol 50 mg twice daily.  4. Lasix 40 mg daily.  5. Metoprolol 50 mg twice daily.  6. Simvastatin 40 mg daily at bedtime.  7. Pantoprazole or Protonix 40 mg daily.  8. Enteric-coated aspirin 81 mg daily.  9. Klor-Con 20 mEq daily.  10.Actonel 35 mg each Sunday.   She follows up at [facility] pacer clinic on Thursday, December  4, at 12 noon.   LABORATORY STUDIES PERTINENT TO THIS ADMISSION:  Complete blood count  taken November 11:  Hemoglobin 10.1, hematocrit 30.8, platelets 262. Pro  time is 12, INR 1. Sodium is 139, potassium 3.5, chloride 102, carbonate  26, glucose 87, BUN 32 and creatinine 2.3.   Dictating thank you      Maple Mirza, PA      Doylene Canning. Ladona Ridgel, MD  Electronically Signed    GM/MEDQ  D:  07/06/2007  T:  07/06/2007  Job:  [id number]   cc:   [name]. [name], MD, [facility]  Terrial Rhodes, M.D.  Dr. Clarene Duke, [place], [place]

## 2010-12-28 NOTE — Assessment & Plan Note (Signed)
[facility]                            CARDIOLOGY OFFICE NOTE   OTILA, STARN                    MRN:          [id number]  DATE:06/26/2007                            DOB:          22-Jan-1935    Zyaira returns today for followup.  She has had a fairly complicated  course lately.  She had pacemaker placed for sick sinus syndrome, PAF,  and A flutter.  There were issues regarding the possibility of an  ablation, but it was never done.  She then had a severe rash on PTU.  I  sent her over to Dr. Nicholas Lose, and she has been placing cream on it.  It is  slowly resolving.  She had previously been on Pacerone, and this has  stopped.  I do not have recent labs on her, but on April 03, 2007, her  TSH was suppressed at 0.092.  I do not know whether this was related to  her Pacerone or not, but it needs to be followed up.  Anisa feels like  there is a problem with her pacemaker.  She seems to be getting shocked  in the middle of the night and feels a sharp pain.  She also feels that  there may be a loose wire under her skin.  She has not had marked  palpitations.  She gets occasional exertional dyspnea.  There has been  no chest pain outside of these nightly shocks.   As I recall, she is fairly pacer dependent.  She had an EKG on September  26 in our office that showed coarse flutter with ventricular pacing.   The patient has good LV function by echo with an EF of 55-65% with mild  aortic root dilatation and moderate-to-severe MR.  Her last echo was  done on February 27, 2007.   I told Alyah that I would like her to see EP again. We will interrogate  her device today and check a chest x-ray.  I think she still may be a  candidate for ablation and/or of her AV node versus flutter ablation.   Her review of systems is otherwise negative.   CURRENT MEDICATIONS:  1. Lisinopril 40 daily.  2. Actonel 35 weekly.  3. Klor-Con 20 b.i.d.  4. Simvastatin 40 a day.  5. Aspirin daily.  6. Metoprolol 50 b.i.d.  7. Lasix 40 daily.  8. Pantoprazole 40 a day.  9. Fluoxetine 20 daily.   PHYSICAL EXAMINATION:  Her exam is remarkable for blood pressure 134/68,  pulse 60 and regular, flutter with V pacing, 134/78, afebrile.  SKIN:  Resolving generalized rash.  HEENT:  Unremarkable.  Carotids are normal without bruit.  No lymphadenopathy, thyromegaly, or  JVP elevation.  LUNGS:  Clear with good diaphragmatic motion.  Pacemaker is under the  left clavicle.  She appears to have excess wire coiled around the can.  Her pacer pocket is otherwise fine.  S1 and S2 with an MR murmur.  PMI increased but not laterally displaced.  ABDOMEN:  Benign.  Bowel sounds positive.  No hepatosplenomegaly,  hepatojugular reflux, no AAA.  Distal pulses  are intact.  No edema.   EKG showing A flutter with V pacing.   IMPRESSION:  1. Status post pacemaker, question malfunction.  Check parameters.  I      do not think that the excess lead felt under the skin is a problem,      but we should be able to detect high resistance if there is any      disconnected coil.  Will also have EP see her.  2. History of atrial fibrillation, status post cardioversion, previous      amiodarone therapy, now in coarse flutter.  Refer to      electrophysiology again for possible ablation, either of the      atrioventricular node or flutter circuit.  3. Thyroid problems:  To follow up with thyroid doctor.  I am not sure      who saw her in the hospital.  She cannot take PTU because of a      rash.  This will need to be taken care of in regards to her atrial      fibrillation/flutter.  4. Mitral regurgitation:  Currently not a surgical candidate, given      her advanced age.  Continue current dose of Lasix.  No congestive      symptoms.  Currently euvolemic.  Ask EP if left sided lead and      resync therapy may be of benefit.   I will see Gerilyn back after EP evaluation.     [name]. [name],  MD, [facility]  Electronically Signed    PCN/MedQ  DD: 06/26/2007  DT: 06/26/2007  Job #: [id number]

## 2010-12-28 NOTE — Assessment & Plan Note (Signed)
[facility]                            CARDIOLOGY OFFICE NOTE   [name], [name]                    MRN:          [id number]  DATE:09/25/2007                            DOB:          30-Apr-1935    [name] returns today for follow-up.   She has had significant congestive heart failure with refractory atrial  arrhythmias and left-sided flutter.   Her last echo showed her EF was 55-60% but she has had significant  diastolic heart failure.   She has had issues in the past with hyperthyroidism and had a severe  rash with PTU.  This was finally cleared up.   The patient also has had significant problems with her lower back.  We  did a CT scan which did not show significant spinal stenosis or at least  bad enough to warrant referral for surgery.  Her B12 level was normal.  We tried to get her referred to neurology who she has not seen yet and  her primary care MD.   The patient has moderate to severe MR and this has also contributed to  some of her dyspnea and heart failure.  She is not a surgical candidate.   She has been evaluated by Dr. Ladona Ridgel on multiple occasions and he really  feels that her flutter is left-sided and would be very high risk for  ablation.   The patient has mild chronic renal insufficiency.  Her creatinine runs  about 2 to 2.2.  She sees Dr. Arrie Aran for this.  I was somewhat  surprised that she was supposed to see Dr. Valhalla Sink for a fistula  placement.  I really do not think that her kidney function was that bad  and I asked her to talk to Dr. Arrie Aran about this before proceeding.   REVIEW OF SYSTEMS:  Otherwise negative.   Exam is remarkable for chronically ill elderly white female in no  distress.  Weight is 175, blood pressure is 150/80, pulse is 64 and  paced, afebrile.  HEENT:  Unremarkable.  NECK:  Carotids are without bruit, no lymphadenopathy, thyromegaly, JVP  elevation.  LUNGS:  Clear  diaphragmatic motion.  No wheezing.  CARDIAC:  There is an S1-S2 with a  murmur.  PMI is mildly increased but  not displaced.  ABDOMEN:  Benign.  Bowel sounds positive.  No  hepatosplenomegaly or GI reflux.  No bruit.  EXTREMITIES:  Distal pulses intact, no edema.  NEURO:  Nonfocal.  SKIN:  Warm and dry.   Her current medications include lisinopril 40 a day, Actonel 35 a day,  simvastatin 40 a day and aspirin a day, Lasix 60 a day, Protonix 40 a  day, iron and metoprolol 75 b.i.d.  She also takes potassium 40 a day,  Plavix 75 a day, Imdur 30 a day, Lexapro 10 a day.   IMPRESSION:  1. Atrial arrhythmias, refractory, continue beta-blocker therapy with      pacemaker backup.   I will have to look back through the chart.  She is not on Coumadin.  I  am not quite sure the history behind this.  1. Mitral insufficiency and not an operative candidate.  Euvolemic.      Continue current dose of Lasix  2. Chronic renal failure.  Follow-up with Dr. Arrie Aran.  I am not      sure that she needs to have a fistula placed at this time.  3. Lower extremity weakness and pain.  No spinal stenosis by CT.      Follow-up neurology and primary care MD.     [name]. [name], MD, [facility]  Electronically Signed    PCN/MedQ  DD: 09/25/2007  DT: 09/26/2007  Job #: [id number]

## 2010-12-31 NOTE — Assessment & Plan Note (Signed)
[name]                            CARDIOLOGY OFFICE NOTE   [name], [name]                    MRN:          [id number]  DATE:08/29/2006                            DOB:          03/10/1935    [name] returns today for followup for her multiple cardiac issues. She  has been under a lot of stress lately. She has a daughter-in-law that  went to prison and has had some difficulties helping to care for her  grandson.   Her husband's health is not good. She is under a lot of stress. She has  been having chest pain over the last month. She has a history of stent  to the OM. She has been compliant with her aspirin and Plavix. Her EKG  today was fine. The chest pain is sharp, it is not necessarily  exertional, it was not helped by nitro, it does not sound typical of  angina but we will have to watch this closely.   She also has a history of PAF. Her event monitor from [facility] does  show occasional episodes of recurrent atrial fibrillation, however, she  is already on aspirin and Plavix and I have not wanted to put her back  on Coumadin.   I think this is particularly is the case if she needs a repeat heart  catheterization for chest pain.   We talked at length about her mood. She is on Xanax and I suggested to  her that she talk to Dr. Clarene [name] about starting Prozac or another  antidepressant.   She also has a history of hypertension which is being borderline  controlled. She has been compliant with her Cardizem, Lopressor, and  Lasix.   Since her blood pressure is still running somewhat high, I told her I  would add long acting nitroglycerine both for her blood pressure and for  her chest pain to see if this helps.   REVIEW OF SYSTEMS:  Although as markable for no TIAs, no really  significant palpitations, no PND, orthopnea.   Her medications include;  1. Klor-Con 40 a day.  2. Cardizem 300 a day.  3. Zantac 150 a day.  4. Lopressor  50 a day.  5. Plavix 75 a day.  6. Lasix 40 a day.  7. An aspirin a day.  8. Actonel.  9. Xanax.   PHYSICAL EXAMINATION:  She looks well. Blood pressure is 158/88. She is  in sinus rhythm at a rate of 70.  HEENT: Normal. There are no carotid bruits.  LUNGS: Clear with an S1, S2  with a soft systolic murmur.  ABDOMEN: Benign.  LOWER EXTREMITY: Intact pulse, no edema.  NEURO: Nonfocal.   EKG: Normal today with no acute changes.   IMPRESSION:  Multiple cardiac issues to address. History of stenting of  the obtuse marginal with recurrent chest pain, not classically anginal  sounding. Add Imdur to current medication. Follow up in 1 to 2 weeks to  decide if heart catheterization needed. Stress level increased due to  family matters. Continue Xanax, consider adding SSRI per primary care.   Paroxysmal  atrial fibrillation with some recurrent bouts, continue  Cardizem and Toprol for rate control, given her known coronary artery  disease, will not start antiarrhythmic agent. Continue aspirin and  Plavix but not Coumadin.   Hypertension also to be treated with the addition of Imdur, continue  current medications including; Cardizem, Toprol, and Lasix.   I will see the patient back in a week and we will make a decision at  that time if she needs a repeat heart catheterization.     [name]. Alexandria Emms, MD, [name]  Electronically Signed    PCN/MedQ  DD: 08/29/2006  DT: 08/29/2006  Job #: [id number]

## 2010-12-31 NOTE — Cardiovascular Report (Signed)
NAMEKEISI, [name]             ACCOUNT NO.:  [id number]   MEDICAL RECORD NO.:  [id number]          PATIENT TYPE:  OIB   LOCATION:  6501                         FACILITY:  [facility]   PHYSICIAN:  Salvadore Farber, M.D. LHCDATE OF BIRTH:  [birth year]   DATE OF PROCEDURE:  12/28/2004  DATE OF DISCHARGE:                              CARDIAC CATHETERIZATION   PROCEDURES:  1.  Left heart catheterization.  2.  Coronary angiography.   INDICATIONS:  Ms. Ellerby is a 76 year old lady with atrial fibrillation  manifested as palpitations and dyspnea.  Contemplation is being given to  initiation of flecainide.  She is referred for diagnostic angiography to  exclude significant coronary disease before starting this medicine.   PROCEDURAL TECHNIQUE:  Informed consent was obtained.  Under 1% lidocaine  local anesthesia, a 4 French sheath was placed in the right common femoral  artery.  Due to renal insufficiency, the patient was pretreated with sodium  bicarbonate per protocol.  Diagnostic angiography was performed using JL4  and JR4 catheters.  Left heart catheterization was performed using a pigtail  catheter.  Ventriculography was deferred due to her renal dysfunction.  Sheaths were then removed.  Complete hemostasis was obtained.  She was then  transferred to the holding room in stable condition.   COMPLICATIONS:  None.   FINDINGS:  1.  LV:  149/21/26.  There is no aortic stenosis on pullback.  2.  Left main:  Angiographically normal.  The vessel was very large.  3.  LAD:  Moderate sized vessel giving rise to a small first and third      diagonal and moderate sized second diagonal.  The proximal vessel has      two calcified lesions of 30% and 40%.  4.  Circumflex:  A moderate sized vessel giving rise to two obtuse      marginals.  The first marginal has a proximal 80% stenosis.  5.  RCA:  A moderate sized dominant vessel.  The PDA arises high on the      vessel.  There is a 20% stenosis  proximally.   IMPRESSION/RECOMMENDATION:  The patient has significant single-vessel  coronary disease.  Flecainide is contraindicated.     WED/MEDQ  D:  12/28/2004  T:  12/28/2004  Job:  [id number]   cc:   Charlton Haws, M.D.

## 2010-12-31 NOTE — Assessment & Plan Note (Signed)
[facility]                            CARDIOLOGY OFFICE NOTE   JAHNESSA, VANDUYN                    MRN:          [id number]  DATE:09/15/2006                            DOB:          Feb 07, 1935    [name] returns today for followup.  The patient has had PAF.  She also  had sick sinus syndrome.  I have adjusted her medications and stopped  her Pacerone in the past.  The patient has been under a lot of stress  lately.  She had been having chest pain.  She has a history of stent to  the OM.  She was placed on Imdur last time I saw her and her chest pains  are now gone.  She was also started on Zoloft 10 mg by her primary care  M.D.  She has not had chest pain, her stress level has improved and she  feels like her heart rate has been in regular rhythm.   Her medications include:  1. Klor-Con 40 a day.  2. Cardizem Long-Acting 300 a day.  3. Zantac 150 a day.  4. Lopressor 50 a day.  5. Plavix 75 a day.  6. Lasix 40 a day.  7. An aspirin a day.  8. Imdur 30 a day.  9. Lexapro 10 a day.   EXAM:  She is in a junctional rhythm at a rate of 45.  In the past, we  have had EKGs that showed junctional or sinus brady at 46.  On her last  visit, she was in sinus rhythm at a rate of 70.  Her blood pressure is  elevated at 158/88.  Lungs are clear.  Carotids are normal.  There is a  soft systolic ejection murmur.  Abdomen is benign.  Lower extremities  with intact pulses, no edema.   IMPRESSION:  The patient has sick sinus syndrome with PAF.  Currently, I  am happy with the aspirin and Plavix to treat her coronary disease more  than her PAF.  I do not think there is an indication for Coumadin.  I  explained to Leanza that there is a high likelihood at some point in the  future she will need a pacemaker.   I am not sure it will be possible to control her PAF and prevent  bradycardia.  She is currently asymptomatic.   She has a narrow complex escape.  I  told her not to take her Lopressor  for the next two days.  We will stop her Cardizem.  Since her blood  pressure continues to be an issue, we will start her on an ACE  inhibitor, lisinopril 40 a day.  Since she is somewhat slow, I will  check a stat BMET today to make sure her kidney function and potassium  are not abnormal, particularly in light of starting an ACE inhibitor.  Jeannette knows not to start taking her lisinopril until she gets a call  from [place], letting her know that her potassium and creatinine are in  normal range.  I will see her back next week.  Hopefully, her heart  rate  will be up at a higher level and we will maintain her on a beta blocker  only.  If she has recurrent rapid atrial fibrillation, I will then refer  her to our electrophysiologist for a pacemaker, so that we can treat her  higher rates more effectively.  For the time being, her chest pain is  gone.  I do not think that there is a current indication for stress  testing or repeat heart catheterization.  If the pain returns and her  rhythm is stable, we will entertain the thought of an adenosine Myoview.  However, given her slow heart rates at this time and inability to  exercise on the treadmill, I prefer to treat her medically.  I will see  her back next week.     [name]. [name], MD, [facility]  Electronically Signed    PCN/MedQ  DD: 09/15/2006  DT: 09/15/2006  Job #: [id number]

## 2010-12-31 NOTE — Discharge Summary (Signed)
[name], [name]             ACCOUNT NO.:  [id number]   MEDICAL RECORD NO.:  [id number]          PATIENT TYPE:  INP   LOCATION:  4715                         FACILITY:  [facility]   PHYSICIAN:  Deirdre Peer. Polite, M.D. DATE OF BIRTH:  23-Oct-1934   DATE OF ADMISSION:  10/08/2004  DATE OF DISCHARGE:                                 DISCHARGE SUMMARY   DISCHARGE DIAGNOSES:  1.  Congestive heart failure, echocardiogram showing preserved ejection      fraction of 55% to 65%, no wall motion abnormalities.  2.  Atrial fibrillation with rapid ventricular response, patient being      discharged in normal sinus rhythm status post cardioversion, currently      on Coumadin with international normalized ratio of 1.7, will be given      Lovenox until INR is therapeutic.  3.  Azotemia secondary to overdiuresis plus/minus ACE inhibitor, the      patient's discharge creatinine 1.5, patient being discharged off ACE      inhibitor, as she has preserved left ventricular function on      echocardiogram.  4.  Polycystic kidney disease.  5.  Reported history of myocardial infarction.  6.  Tobacco abuse.   DISCHARGE MEDICATIONS:  1.  Lopressor 100 mg b.i.d.  2.  Cardizem 360 mg daily.  3.  Coumadin 5 mg daily.  4.  Lasix 20 mg daily.  5.  K-Dur 20 mEq one daily.   DISPOSITION:  The patient is being discharged home in stable condition in  normal sinus rhythm and asked to follow up with [facility] for  outpatient followup.  The patient is also asked to followup with her primary  MD on Monday and have PT and INR checked.  Please note discharge INR of 1.7.   CONSULTATIONS:  [facility].   IMAGING PROCEDURES:  1.  Two-dimensional echocardiogram showed an EF of 55% to 65%, no wall      motion abnormalities.  2.  CT of chest negative for PE, did show polycystic kidneys.  3.  Chest x-ray on February 24 showed mild pulmonary edema with possible      early CHF.   DISCHARGE LABORATORIES:   CBC within normal limits.  INR 1.7.  Creatinine  1.5.  Cardiac enzymes negative for ischemia x3, BNP on admission 224.  Lipid  panel:  Total cholesterol 173, LDL 92, HDL 68.   HISTORY OF PRESENT ILLNESS:  This is a 76 year old female with the above  medical problems who presented to the ED with complaints of dyspnea.  In the  ED the patient was evaluated and had an elevated BNP, abnormal chest x-ray  showing CHF.  Therefore, admission was deemed necessary for further  evaluation and treatment.  Please see the dictated H&P for further details.   PAST MEDICAL HISTORY:  As stated above.   MEDICATIONS ON ADMISSION:  Hydrochlorothiazide, Norvasc, Altace, K-Dur,  Prevacid, aspirin.   ALLERGIES:  No known drug allergies.   REVIEW OF SYSTEMS:  Per admission H&P.   HOSPITAL COURSE:  The patient was admitted to a telemetry floor bed for  evaluation  and treatment of CHF which was most likely secondary to her  atrial fibrillation with rapid ventricular response.  The patient was  started on heparin.  Rate control was achieved with IV Cardizem.  The  patient was seen in consultation by cardiology.  The patient had multiple  studies, i.e., cardiac enzymes which were negative.  The patient had a 2-D  echo which showed preserved EF.  The patient had a CAT scan of the chest  which was negative for PE.  It was noted on an outpatient basis that the  patient had a Cardiolite stress test with an EF of 70% with no obvious  ischemia on the scan.  The patient's medications were titrated until she  became euvolemic.  Ultimately, cardiology decided to cardiovert this  patient.  The patient underwent direct cardioversion on March 03, has been  maintained in normal sinus rhythm.  The patient's INR is 1.7.  The patient  has been cleared for discharge by cardiology with Lovenox until her INR is  therapeutic.  The patient will require outpatient followup with cardiology  and her primary MD.   Problem 2.  Cough,  questionably secondary to postnasal drip versus ACE  inhibitor.  The patient was treated conservatively; however, when her ACE  inhibitor was stopped her cough improved.  This will need to be continued to  be followed on an outpatient basis.   The patient has several other medical problems which are stable at the time  of this dictation.  The patient will continue outpatient medications for her  other chronic medical problems.  Please note that discharge medications that  the patient should be taking are listed in the medications section, which  include Lopressor, Cardizem, Coumadin, Lasix and K-Dur.      RDP/MEDQ  D:  10/16/2004  T:  10/17/2004  Job:  [id number]

## 2010-12-31 NOTE — Discharge Summary (Signed)
NAMEJAKEIA, [name] NO.:  [id number]   MEDICAL RECORD NO.:  [id number]          PATIENT TYPE:  INP   LOCATION:  3739                         FACILITY:  [facility]   PHYSICIAN:  [name], M.D.  DATE OF BIRTH:  06-06-1935   DATE OF ADMISSION:  11/02/2004  DATE OF DISCHARGE:                           DISCHARGE SUMMARY - REFERRING   PROCEDURE:  DC cardioversion November 05, 2004.   REASON FOR ADMISSION:  [name] is a 76 year old female, patient of Dr.  Charlton [name], recently hospitalized for paroxysmal atrial fibrillation in  March, at which time she failed DC cardioversion, with failed DC  cardioversion in February of this year, and who now returned to the  emergency room with atrial fibrillation with rapid ventricular response and  associated congestive heart failure.  Please refer to dictated admission  note for full details.   LABORATORY DATA:  INR 1.5 at discharge, 5.5 at admission.  Sodium 3.5, BUN  26, creatinine 1.6 at discharge (peak creatinine 1.9)  BNP 264 at discharge  (462 on admission).  Normal CBC prior to discharge.   Admission chest x-ray:  Cardiomegaly, no evidence of failure.   HOSPITAL COURSE:  Patient was admitted by Dr. Duke [name] for treatment  of paroxysmal atrial fibrillation with rapid ventricular response and  associated congestive heart failure.  [name] recommended initiation of  propafenone 150 mg q.8h. with plans to proceed with DC cardioversion over  the next several days.  She was also placed on IV Lasix for aggressive  diuretic treatment and associated congestive heart failure.  Coumadin was  adjusted, given elevated INR (5.5 on admission with subsequent peak level of  6.3).   Patient had good clinical response to diuretic regimen and serial BNP showed  downward trend.   On hospital day #3, patient was referred for DC cardioversion, with  successful conversion to sinus bradycardia after application of 120  joules.  There were no noted complications, however, patient subsequently reverted  back to atrial fibrillation the following day.  [name] also noted  that she had missed two doses of Rhythmol secondary to bradycardia.  He  conferred with [name] regarding the bradycardia on propafenone,  and patient was switched to [name] 100 b.i.d. with plans for a repeat DC  cardioversion the following Monday.   On schedule day of repeat DC cardioversion, however, patient was seen by Dr.  Charlton [name], who noted that patient was anxious to go home given that it  was her birthday.  He felt that she was clinically stable, with compensated  congestive heart failure.  He, thus recommended a dose of Lovenox prior to  discharge, with a Coumadin dose of 10 mg.  Patient will continue on  Flecainide and will return to the office later this week for early protime  follow-up and office visit with [name].   At the time of discharge, patient's medication regimen was reviewed by Dr.  Eden [name], with final recommendations as listed in discharge medications.   DISCHARGE MEDICATIONS:  1.  Coumadin 10 mg daily.  2.  Flecainide 100 mg b.i.d.  3.  Cardizem 300 mg daily.  4.  Lopressor 50 mg b.i.d.  5.  Lasix 40 mg daily.  6.  K-Dur 40 mEq daily.  7.  Nexium one tablet daily.   DISCHARGE INSTRUCTIONS:  1.  Follow-up with [name] on Wednesday, November 10, 2004, at      10:15 a.m.  2.  Follow-up with [name] on Friday, November 12, 2004, at 2:30 p.m.   DISCHARGE DIAGNOSIS:  1.  Recurrent paroxysmal atrial fibrillation with rapid ventricular      response.      1.  Failed direct current  cardioversion November 05, 2004.      2.  Flecainide load.      3.  Failed direct current cardioversion February 2006.  2.  Chronic Coumadin.      1.  Supratherapeutic INR on admission.  3.  Congestive heart failure.  4.  Normal left ventricular function.      1.  Negative Cardiolite January  2005.  5.  History of hypertension.  6.  Gastroesophageal reflux disease.  7.  Hypokalemia.  8.  Mild renal insufficiency.      GS/MEDQ  D:  11/08/2004  T:  11/08/2004  Job:  [id number]   cc:   [name]. [name], M.D.  [address]  [place] [address]  Fax: [phone]

## 2010-12-31 NOTE — Assessment & Plan Note (Signed)
[name]                            CARDIOLOGY OFFICE NOTE   [name]                    MRN:          [id number]  DATE:10/26/2006                            DOB:          July 06, 1935    [name] returns today for followup.  She has had multiple cardiac  problems.   Her last catheterization was in June of 2006 and at that time she had a  stent placed to the first obtuse marginal branch.   She has had atrial fibrillation and is status post recent cardioversion.  We now have her off Coumadin.  She had also previously been on  amiodarone, which is stopped.  She has been maintaining sinus rhythm.  She is having increasing shortness of breath, hypertension, and chest  pain.  The last time we saw her, her BUN was elevated at 30 and her  creatinine was 2.16.  We tried to get a hold of her to have her lower  her Lasix dose, but she did not have a phone.  She came into the office  today and we took advantage of the time to go over her results.   I was concerned about the patient's chest pain.  It is anginal-sounding.  She has not taken nitro for it.   She has been maintained on aspirin and Plavix.  I told her we would hold  her diuretic for the time being and arrange for her to have an  outpatient right and left heart cath.   I am concerned about stent restenosis in regards to the patient's blood  pressure.  We will look at her renal arteries.  We will have to see if  her creatinine falls below 2 off of her diuretic.   She would appear to be prerenal based on her BUN of 30.  We will recheck  her lab work today and see if she has to hold her Lasix all together or  just cut it back to 20 a day.  We will check a BNP at the time.  I am  not sure why she should be short of breath.  She has had good left  ventricular function in the past.   CURRENT MEDICATIONS:  1. Klor-Con 40 a day.  2. Zantac 150 a day.  3. Lopressor 50 a day.  4. Plavix  75 a day.  5. Lasix 40 a day to be decreased to 20 or held depending on her labs.  6. An aspirin a day.  7. Actonel 35 mcg a week.  8. Xanax p.r.n.  9. Imdur 30 a day.  10.Lexapro 10 a day.  11.Lisinopril 40 a day.   EXAM:  Blood pressure is elevated at 170/80, pulse 70 and regular.  HEENT:  Normal.  Carotids are normal without bruit.  LUNGS:  Currently clear with no rales.  There is an S1, S2 with systolic murmur.  ABDOMEN:  Benign.  LOWER EXTREMITIES:  Intact pulses with no edema.   ELECTROCARDIOGRAM:  Sinus rhythm with nonspecific ST-T wave changes and  possible previous inferior wall MI.   IMPRESSION:  1. History  of stent to the circumflex.  On aspirin and Plavix.  2. Recurrent chest pain.  3. Need for diagnostic catheterization.  4. Shortness of breath despite elevated BUN and creatinine in a      history of normal left ventricular function.  She may have      diastolic dysfunction.  However, she is currently prerenal and we      are needing to hold or back off on her diuretics.  We will check      her lab work today, including a BNP.  Arrangements will be made for      her to have a diagnostic catheterization once her BNP and BMET are      known and her creatinine is under 2.   Further recommendation will be based on the results of her heart  catheterization.   After her cath, we may need to institute hydralazine or some other non-  nephrotoxic blood pressure medicine, as her shortness of breath may be  form diastolic failure and hypertension.     Alexandria Pick. [name], MD, [facility]  Electronically Signed    PCN/MedQ  DD: 10/26/2006  DT: 10/27/2006  Job #: [id number]

## 2010-12-31 NOTE — Op Note (Signed)
[name], [name]                         ACCOUNT NO.:  [id number]   MEDICAL RECORD NO.:  [id number]                   PATIENT TYPE:  OIB   LOCATION:  3014                                 FACILITY:  [facility]   PHYSICIAN:  Reinaldo Meeker, M.D.              DATE OF BIRTH:  21-Jan-1935   DATE OF PROCEDURE:  09/05/2003  DATE OF DISCHARGE:                                 OPERATIVE REPORT   PREOPERATIVE DIAGNOSIS:  Spondylosis and herniated disc L3-L4, left.   POSTOPERATIVE DIAGNOSIS:  Spondylosis and herniated disc L3-L4, left.   PROCEDURE:  Left L3-L4 interlaminar laminotomy with decompression of  underlying L4 nerve root followed by left L3-L4 microdiscectomy.   SURGEON:  Reinaldo Meeker, M.D.   ASSISTANT:  Donalee Citrin, M.D.   PROCEDURE IN DETAIL:  After being placed in the prone position, the  patient's back was prepped and draped in the usual sterile fashion.  Localizing x-ray was taken prior to the incision to identify the appropriate  level.  A midline incision was made above the spinous processes of L3 and  L4.  Using Bovie cutting current, the incision was carried to the spinous  processes.  Subperiosteal dissection was then carried out on the left side  of the spinous processes and lamina and the self-retaining retractor was  placed for exposure.  A second x-ray showed we approached the appropriate  level.  Using a high speed drill, the inferior 1/2 of the L3 lamina and the  medial 1/3 of the facet joint were removed.  We then removed the superior  1/3 of the L4 lamina.  Residual bone and the ligamentum flavum was removed  in a piecemeal fashion.  The microscope was draped and brought on the field  and used for the remainder of the case.  Residual bone and ligamentum flavum  were removed to decompress the adenoid and thecal sac and L4 nerve root  which was followed out to its foramen.  The screw was identified and found  to be focally ruptured towards the foramen.  After  coagulating all bleeders,  it was incised with 15 blade.  Using pituitary rongeurs and curets, the  herniated fragment was removed but the disc space could not be entered due  to marked collapse.  At this point, large amounts of irrigation were carried  out.  Any bleeding was controlled with bipolar coagulation and Gelfoam.  The  wound was then closed using interrupted Vicryl of the muscle and fascia,  subcutaneous and subcuticular tissues, and staples on the skin.  A sterile  dressing was then applied and the patient was extubated and taken to the  recovery room in stable condition.  Reinaldo Meeker, M.D.    ROK/MEDQ  D:  09/05/2003  T:  09/05/2003  Job:  [id number]

## 2010-12-31 NOTE — Consult Note (Signed)
[name], MAGOUIRK NO.:  [id number]   MEDICAL RECORD NO.:  [id number]          PATIENT TYPE:  EMS   LOCATION:  MAJO                         FACILITY:  [facility]   PHYSICIAN:  Learta Codding, M.D. LHCDATE OF BIRTH:  09-02-1934   DATE OF CONSULTATION:  10/18/2004  DATE OF DISCHARGE:  10/18/2004                                   CONSULTATION   REASON FOR CONSULTATION:  Evaluation of a patient who was recently admitted  with atrial fibrillation, mild CHF, post cardioversion, now with recurrent  atrial fibrillation.   HISTORY OF PRESENT ILLNESS:  The patient is a 76 year old female with a  prior history of mild congestive heart failure.  The patient was recently  admitted to the hospitalist service with complaints of palpitations and  shortness of breath.  The patient was found to be in atrial fibrillation  with rapid ventricular response.  It was felt that rapid atrial fibrillation  was contributing to the patient's heart failure symptoms.  The patient was  diuresed and rate controlled.  She ultimately underwent a TEE-guided  cardioversion, and was restored to normal sinus rhythm by Dr. Andee Lineman.  The  patient then was discharged on October 16, 2004 after clearance by Dr. Ladona Ridgel.  The patient now presents back with atrial fibrillation.  She was seen by Dr.  Clarene Duke in his office in [place] for PT/INR check.  The patient's heart rate  was approximately 130 beats per minute, and she was felt to be again in mild  CHF.  Apparently, Dr. [name] was contacted and felt the patient needed to be  seen in the emergency room.  I have been called to evaluate the patient  around 10 o'clock in the evening after the patient had been several hours in  the emergency room.  The patient's heart rate is around 110-120 beats per  minute now.  The patient is quite comfortable.  She denies any significant  shortness of breath.  She does say that she gets mild dyspnea upon exertion  which is somewhat  chronic.  She has, however, no orthopnea or PND.  She has  no palpitations.  The patient otherwise feels she is at baseline.  After I  had talked with the patient and reviewed her medication, it became clear  that she was confused about how to take her medications.  She was supposed  to be on Lopressor 100 mg p.o. b.i.d.  She was only taking this once a day.  She was taking the Cardizem 360 mg as directed.  Her labs in the ER were  also noted to have a therapeutic INR at 2.7 (the patient had briefly been on  outpatient Lovenox).  Her BUN and creatinine were stable at 1.6 (the  patient, based on creatinines, appeared to be between 1.8 to 1.6).   PAST MEDICAL HISTORY:  1.  As outlined above with recent hospitalization for atrial fibrillation,      rapid ventricular response, and mild congestive heart failure.  2.  A 2-D echo was performed with an ejection fraction of 55% to 65%.  No  wall motion abnormalities.  This was also confirmed by the TEE, which      normal ejection fraction.  CT of the chest performed during his last      hospitalization was negative for pulmonary embolism.  Notable for      polycystic kidney disease.   FAMILY HISTORY AND SOCIAL HISTORY:  Reviewed.   MEDICATIONS:  1.  Warfarin 5 mg daily.  2.  KCl 20 mEq p.o. daily.  3.  Lasix 20 mg p.o. daily.  4.  Lopressor 100 mg p.o. b.i.d.  5.  Cardizem CD 260 mg p.o. daily.   REVIEW OF SYSTEMS:  As outlined above.   PHYSICAL EXAMINATION:  VITAL SIGNS:  Blood pressure 136/85, heart rate 100  beats per minute, temperature afebrile.  NECK:  Normal carotid upstrokes.  No carotid bruits.  No definite JVD.  LUNGS:  A few crackles at the bases bilaterally.  HEART:  Irregular rate and rhythm with normal S1 and S2 and no definite  pathological murmurs.  ABDOMEN:  Soft.  EXTREMITIES:  1 to 2+ peripheral edema.   LABORATORY WORK:  Hemoglobin is 14.2, white count 7.0, platelet count 234,  INR 2.7.  Sodium is 131, potassium  4.2, BUN 29, and creatinine 1.6.   PROBLEM LIST:  1.  Atrial fibrillation with rapid ventricular response, recurrent, status      post  failed cardioversion.  2.  Mild congestive heart failure.  3.  Mild chronic renal insufficiency.  Baseline creatinine of 1.3 to 1.5.  4.  Anticoagulation on warfarin therapy.  5.  Medical noncompliance with Lopressor, as the patient was confused about      how to take her medications.  6.  History of azotemia secondary to over-diuresis and ACE inhibitor use.  7.  Normal left ventricular systolic function.  8.  Rule out for pulmonary embolism during last hospitalization.   PLAN:  I talked with the patient today in the emergency room.  The patient  is hemodynamically perfectly stable.  She is indeed back in atrial  fibrillation, but her heart rate is poorly controlled due to the fact that  she was confused on how take her Lopressor.  At this point in time, there is  no clear reason to admit the patient, but she can rather be followed up as  an outpatient.  I have resumed her Lopressor back at 100 b.i.d., including  addition of her Cardizem CD at 360 mg p.o. daily.  The patient essentially  has long term 2 options.  She could stay in atrial fibrillation with rate  control.  She is rather asymptomatic.  She will need to continue on warfarin  anticoagulation.  When she comes back to the office, the decision could be  made to consider starting her on an outpatient basis on amiodarone, and then  readmit her for an elective cardioversion.  I have given the patient the  pros and cons of thesevarious options, and she is not particularly keen in  doing another cardioversion.  I do think a rate control strategy is  perfectly acceptable in this patient.  I did increase her Lasix just for a  few days, but will have to follow her creatinine very carefully, as her BNP  level was not particularly high in the ER tonight.  She already has a prior history over-diuresis.   I have asked her to double up on her Lasix for the  next 5 days ONLY.  I will have the office call  tomorrow to set up an  appointment for her to come back for a BMET in the next couple of days, and  will also have her see the P.A. prior to her visit with Dr. [name] on November 08, 2004.  Dr. [name] can, at that time, make definitive  recommendations whether he wants this patient to be back in normal sinus  rhythm or continue a rate control strategy, the latter which I would favor.  Antiarrhythmic drug therapy would include amiodarone, and would certainly  make for a more complicated medical regimen.  The final decision will be  left to Dr. [name].      GED/MEDQ  D:  10/18/2004  T:  10/18/2004  Job:  [id number]   cc:   Charlton Haws, M.D.   [facility]  [address]  [place]  [place] [address]  Fax: [phone]

## 2010-12-31 NOTE — H&P (Signed)
[name], [name]             ACCOUNT NO.:  [id number]   MEDICAL RECORD NO.:  [id number]          PATIENT TYPE:  EMS   LOCATION:  MINO                         FACILITY:  [facility]   PHYSICIAN:  Jackie Plum, M.D.DATE OF BIRTH:  06-Jan-1935   DATE OF ADMISSION:  10/08/2004  DATE OF DISCHARGE:                                HISTORY & PHYSICAL   CHIEF COMPLAINT:  Dyspnea.   HISTORY OF PRESENT ILLNESS:  The patient is a 76 year old Caucasian lady  with history of CHF who presents with the above complaint.  According to the  patient she had been having progressively worsening shortness of breath for  1 month.  She had concurrent increasing lower extremity edema. She has had  some cough which is productive of whitish sputum.  She is not having a fever  or chills. She has had difficulty sleeping at because of worsening shortness  of breath when lying flat at night and has had to sit up most of night.  She  denies dysuria or frequency of micturition. She has had some mild  palpitations. No dizziness. No headaches. No nausea or vomiting. No  abdominal pain, constipation or diarrhea. No heat or cold intolerance. No  polyuria or polydipsia.  Due to her progressive symptoms she came to the ED  whereupon an x-ray revealed cardiomegaly with mild edema. Her BNP was  elevated.  Hospitalist service was therefore asked to evaluate the patient  for admission for presumptive congestive heart failure.   PAST MEDICAL HISTORY:  1.  Positive of history of a hiatal hernia.  2.  History of congestive heart failure.  3.  Previous myocardial infarction.  4.  History of polycystic kidneys.   ALLERGIES:  No known drug allergies.   MEDICATIONS:  1.  Hydrochlorothiazide 25 mg daily.  2.  Norvasc 5 mg daily.  3.  Altace 10 mg daily.  4.  Kay Ciel 20 mEq daily.  5.  Prevacid 30 mg daily.  6.  Aspirin.  7.  _______for a recent postoperative infection.  8.  Heart disease.   SOCIAL HISTORY:  The  patient live by herself; she is separated. She has a  grown child who does not live at home. She has smoked one pack of cigarettes  for more than 30 years (currently has smoked one pack of cigarettes over the  past week). She does not drink alcohol. She independent in activities of  daily living.   PHYSICAL EXAMINATION:  VITAL SIGNS:  Blood pressure was 128/77, pulse 144,  respirations 20, temperature 97.3, O2 saturation 98% on 2 liters of oxygen  per nasal cannula. At time of admission she was in acute pulmonary distress.  GENERAL:  She looks older than her stated age of 53 years.  HEENT:  Normocephalic, atraumatic. Pupils are equal, round and reactive to  light. Extraocular movements are intact. Pharynx was moist, no exudates or  erythema.  LUNGS:  She had decreased breath sounds bilaterally at the bases with a few  fine rales bilaterally over the lower one-third of the lung fields.  CARDIAC:  Irregularly irregular, no gallops.  ABDOMEN:  Obese, soft, nontender.  EXTREMITIES: No cyanosis. She has 3+ pitting pedal edema.  CNS EXAM:  Nonfocal.   LABORATORY DATA:  X-ray was mentioned above. White blood cell count 5900,  hemoglobin 12.4, hematocrit 35.6, MCV 85.8, platelet count 175,000. Sodium  135, potassium 3.5, chloride 100, CO2 26, glucose 106, BUN 19, creatinine  1.2, AST 86, ALT 56, otherwise complete metabolic panel was unremarkable.  Point of care cardiac markers were negative. Her BNP was 224.6 picogram, mL.  Mildly elevated D-dimer of 0.60.   IMPRESSION:  A 76 year old lady with history of previous myocardial  infarction and heart disease who complains of dyspnea which has been  progressively worsening. She has had paroxysmal nocturnal dyspnea and  orthopnea as well as increasing lower extremity edema. X-rays shows  cardiomegaly with mild edema. Her EKG looks like MAT but cannot rule out  atrial fibrillation. I think I do see some different P waves.  I do not see  any acute  ST, T wave changes.  Her D-dimer is mildly elevated and no follow  up CT has been ordered.  1.  Heart failure.  2.  Probable atrial fibrillation with rapid ventricular response.   The patient is admitted to the hospital and will be monitored. Will start  Cardizem IV, if 20 mg bolus does not control her heart rate she would need a  cardiac panel cycling, TSH and echocardiogram.  Will place a 12-lead EKG and  start her on Lasix diuresis.  We have consulted cardiology who are here to  see the patient to help [place] determine her rhythm abnormality.  Will obtain a  fasting lipid panel in the morning. Further recommendations will be made  after results of the studies.      GO/MEDQ  D:  10/08/2004  T:  10/08/2004  Job:  [id number]

## 2010-12-31 NOTE — Discharge Summary (Signed)
[name], ROMO             ACCOUNT NO.:  [id number]   MEDICAL RECORD NO.:  [id number]          PATIENT TYPE:  INP   LOCATION:  6527                         FACILITY:  [facility]   PHYSICIAN:  Salvadore Farber, M.D. LHCDATE OF BIRTH:  1935/04/20   DATE OF ADMISSION:  01/19/2005  DATE OF DISCHARGE:  01/20/2005                                 DISCHARGE SUMMARY   PROCEDURES:  Cypher stenting, first obtuse marginal artery, January 19, 2005.   REASON FOR ADMISSION:  [name] is a 76 year old female, a patient of  Dr. Charlton Haws, with history of recurrent atrial fibrillation, status post  failed DC cardioversion and chemical cardioversion, recently referred for  diagnostic cardiac catheterization on Dec 28, 2004.  This revealed an 80%  proximal first obtuse marginal artery.  The patient is now referred for  elective percutaneous intervention.   LABORATORY DATA:  WBC 5, hematocrit 35, platelets 200 at discharge.  Sodium  140, potassium 3.8, BUN 20, creatinine 1.3 at discharge.  CPK/MB 0.9 at  discharge.   HOSPITAL COURSE:  The patient presented for elective percutaneous  intervention, performed by Dr. Randa Evens, with successful Cypher  stenting of an 80% proximal first obtuse marginal lesion with no noted  complications.   The patient was kept for overnight observation and cleared for discharge the  following morning by Dr. Samule Ohm with no noted complications at the right  groin incision site.   MEDICATION ADJUSTMENTS:  Dr. Samule Ohm recommended treatment with Plavix for at  least one year, aspirin 81 mg daily and discontinuation of both flecainide  and Lopressor.  The patient was started on Toprol XL 150 mg daily.   Of note, I also discussed with Ms. Icenhower her previous diuretic regimen  (Demadex, Zaroxolyn and supplemental potassium).  This was apparently held  for almost one week prior to her catheterization.  Given her absence of any  symptoms, I instructed her to not  resume these medications until further  discussion with Dr. [name], at time of scheduled followup.   DISCHARGE MEDICATIONS:  1.  Plavix 75 mg daily (at least one year).  2.  Coated aspirin 81 mg daily.  3.  Coumadin 2.5 mg daily.  4.  Toprol XL 150 mg daily.  5.  Cartia XT 300 mg daily.  6.  Nexium 40 mg daily.  7.  Nitrostat 0.4 mg as directed.   DISCHARGE INSTRUCTIONS:  1.  Stop flecainide, Lopressor and Imdur.  2.  Do not resume Demadex, Zaroxolyn and supplemental potassium until      further instructions.  3.  Follow up at [facility] on Monday, January 24, 2005 at 9:15      a.m.   The patient will keep previously scheduled followup appointment with Dr.  [name] on Wednesday, January 26, 2005 at 9:45 a.m.   DISCHARGE DIAGNOSES:  1.  Single-vessel coronary artery disease, status post Cypher stenting of      proximal first obtuse marginal artery, January 19, 2005.  2.  Recurrent atrial fibrillation and flutter.  3.  Chronic Coumadin.  4.  History of normal left ventricular function.  5.  History of congestive heart failure.  6.  Chronic renal insufficiency.  7.  Hypertension.  8.  Gastroesophageal reflux disease.       GS/MEDQ  D:  01/20/2005  T:  01/20/2005  Job:  [id number]

## 2010-12-31 NOTE — Assessment & Plan Note (Signed)
[facility]                            CARDIOLOGY OFFICE NOTE   Alexandria, [name]                    MRN:          [id number]  DATE:11/23/2006                            DOB:          October 19, 1934    [name] returns today for follow up.  I did a heart catheterization on her  October 31, 2006.  She had a previous stent to the OM and was having chest  pain.  The stent was widely patent.  She had significant hypertension.  At the time, we looked at her renal arteries, and she had no renal  artery stenosis.  Her right heart pressures were also fine.   The patient would appear to have primarily hypertensive disease now.  Her blood pressure is quite elevated in the office today with systolics  over 200.  She has been compliant with her medications.  I told her to  go to Dr. Fayrene Fearing Little's office in [place] at least twice a week to get  her blood pressures checked, as she has been compliant with her  medications which include diuretics, ACE inhibitors, Imdur and a beta  blocker.  Unfortunately, I think there is no other possibility but to  add the medicine after some thought.  I think we will add clonidine 0.2  b.i.d., and I will see her back in 8 weeks.  She has history of  paroxysmal atrial fibrillation.  She has not had any significant  palpitations or recurrences.  She used to be on Pacerone, and we have  stopped this.   CURRENT MEDICATIONS:  1. Klor-Con 40 a day.  2. Zantac 150 a day.  3. Lopressor 50 a day.  4. Plavix 75 a day.  5. Aspirin daily.  6. Imdur 30 a day.  7. Lisinopril 40 a day.  8. Baby aspirin.   REVIEW OF SYSTEMS:  Remarkable for some easy bruising.  She has not had  any recurrent palpitations.  She gets muscular-type chest pain, which  does not sound anginal, and her recent cath showed a widely patent stent  to the circumflex.  She has not had any TIAs or CVAs and has not been  getting any headaches.  A 10-point review of  systems is otherwise  negative.   PHYSICAL EXAMINATION:  VITAL SIGNS:  The blood pressure is elevated at  194/89, pulse 65 and regular.  HEENT:  Normal.  NECK:  Carotids are normal without bruits.  LUNGS:  Clear.  There is an S1 and an S2 with an S4 gallop.  ABDOMEN:  Benign.  EXTREMITIES:  Lower extremities with intact pulses, no edema.  Cath site  is well healed.  NEUROLOGIC:  Nonfocal.  SKIN:  Warm and dry.   IMPRESSION:  1. History of coronary artery disease with stent to the circumflex      widely patent by catheterization.  Continue aspirin and Plavix.  2. History of paroxysmal atrial fibrillation, maintaining sinus      rhythm.  3. Hypertension, not adequately controlled.  To add Klonopin 0.2      b.i.d.  Follow up outpatient blood pressure checks  with Dr. Clarene Duke.      Follow up with me in 8 weeks.   She does not have renal artery stenosis by cath, and I suspect she has  essential hypertension.     [name]. [name], MD, [facility]  Electronically Signed    PCN/MedQ  DD: 11/23/2006  DT: 11/23/2006  Job #: [id number]

## 2010-12-31 NOTE — Cardiovascular Report (Signed)
[name], [name]             ACCOUNT NO.:  [id number]   MEDICAL RECORD NO.:  [id number]          PATIENT TYPE:  OIB   LOCATION:  1961                         FACILITY:  [facility]   PHYSICIAN:  Peter C. [name], MD, FACCDATE OF BIRTH:  [birth year]   DATE OF PROCEDURE:  10/31/2006  DATE OF DISCHARGE:                            CARDIAC CATHETERIZATION   [name] is a 76 year old patient who has a history of stenting of  the OM.  She has been having increasing dyspnea, hypertension and chest  pain.  The study was done to assess for pulmonary hypertension, in-stent  restenosis and renal artery stenosis.   Because of the patient's creatinine of 10, limited dye was used; 25 cc  of contrast was used.  An left ventriculogram was not performed.   Left main coronary had a 20% tubular stenosis.   Left anterior descending artery had 30-40% multiple discrete lesions in  the proximal and mid vessel.  There was a 95% ostial first septal  perforator stenosis.   The circumflex coronary artery is nondominant.  There is 30% distal  disease.  The first obtuse marginal branch was widely patent and  stented.  The right coronary artery was dominant.  There was 30% tubuler  lesions in the proximal and mid portion.  The distal vessel was normal.   The patient had no critical coronary disease outside of the first septal  perforator.   Right heart pressures were done due to the patient's dyspnea.  Mean RA  pressure was 4, RV pressure was 28/2, PA pressure was 28/17, mean  pulmonary capillary wedge pressure was 8.  LV pressure is 138/6.  Aortic  pressure was 145/84.   Selective renal arteriography showed there to be single large renal  arteries bilaterally with no significant stenoses.   A total of 26 cc of dye was used.   IMPRESSION:  The patient does not have restenosis.  The septal  perforator is not amenable to angioplasty and should not be at high risk  for recurrent events.   The stent in  the OM is widely patent.  She does not have significant  pulmonary hypertension.  Her diuretics will continue to be held.   We will check her creatinine in 48 hours.   Depending on her renal insufficiency, we may switch her from ACE  inhibitors to hydralazine and nitrates.   Overall, the patient primarily would appear to have hypertensive disease  without renal artery stenosis or pulmonary hypertension.   Her stent is widely patent and her prognosis should be good.      [name]. [name], MD, [facility]  Electronically Signed     PCN/MEDQ  D:  10/31/2006  T:  10/31/2006  Job:  [id number]

## 2010-12-31 NOTE — Assessment & Plan Note (Signed)
[facility]                                 ON-CALL NOTE   KYRIANA, YANKEE                       MRN:          [id number]  DATE:08/20/2006                            DOB:          Jul 24, 1935    PRIMARY CARDIOLOGIST:  Dr. Charlton Haws.   I received a page from [facility] regarding an event monitor being worn  by Sharyne Peach that reportedly showed atrial fibrillation with heart  rates ranging from 70 to 100 beats per minute.  It was explained to me  that the patient has a known history of atrial fibrillation.  I asked  that representative strips be faxed to our office for further review by  Dr. [name].     Jonelle Sidle, MD  Electronically Signed    SGM/MedQ  DD: 08/20/2006  DT: 08/20/2006  Job #: [id number]   cc:   [name]. [name], MD, [facility]

## 2010-12-31 NOTE — Op Note (Signed)
NAMEROCIO, ROAM             ACCOUNT NO.:  [id number]   MEDICAL RECORD NO.:  [id number]          PATIENT TYPE:  INP   LOCATION:  3739                         FACILITY:  [facility]   PHYSICIAN:  Rollene Rotunda, M.D.   DATE OF BIRTH:  1935/05/20   DATE OF PROCEDURE:  11/05/2004  DATE OF DISCHARGE:                                 OPERATIVE REPORT   PROCEDURE:  Elective direct current cardioversion.   INDICATIONS:  Symptomatic atrial fibrillation.   PROCEDURE NOTE:  The patient, after informed consent, was sedated using  sodium Pentothal by Dr. Jacklynn Bue.  The airway was maintained with bag  ventilation.  She was cardioverted x1 with 120 joules of biphasic energy.  She converted to sinus bradycardia with premature atrial contractions.  There were no apparent complications.   The patient will have her beta blocker and calcium channel blocker held; now  that she is no longer in atrial fibrillation.  She will remain on telemetry.      JH/MEDQ  D:  11/05/2004  T:  11/06/2004  Job:  [id number]   cc:   [name], M.D.  [place], [place]

## 2010-12-31 NOTE — Assessment & Plan Note (Signed)
[facility]                              CARDIOLOGY OFFICE NOTE   EVLYN, AMASON                      MRN:          [id number]  DATE:06/30/2006                            DOB:          May 11, 1935    Shantana returns today for followup.   The last time I saw her she had significant bradycardia and we had lowered  her beta blocker.  She has a history of PAF, palpitations, hypertension,  lower extremity edema.  Her Coumadin was stopped earlier this fall.  I  suspect this is part of sick sinus syndrome for her.  Her Pacerone was also  stopped at that time.   She has been doing fairly well.  Her primary complaint is lower extremity  edema.  On review of systems, she does get occasional palpitations, maybe  once every 2-3 weeks.  She denied any significant chest pain, PND,  orthopnea.   EXAMINATION:  VITAL SIGNS:  The blood pressure is 130/80, pulse is 70 and  regular.  HEENT:  Normal.  There is no lymphadenopathy, no thyromegaly.  Carotids are  normal.  LUNGS:  Clear.  HEART:  There is an S1, S2 with a soft systolic murmur.  ABDOMEN:  Benign.  EXTREMITIES:  Distal pulses are intact.  There is trace edema.  NEUROLOGIC:  Nonfocal.  SKIN:  Warm and dry.   IMPRESSION:  Stable coronary artery disease, previous stenting of the obtuse  marginal branch in June 2006 by Dr. Samule Ohm.  Continue aspirin and Plavix  therapy.   Paroxysmal atrial fibrillation maintaining sinus rhythm.  If her  palpitations were to continue we would get an event monitor for her, but  they are very infrequent.  She will continue her Lopressor at the current  lower dose given her bradycardia before.   Her hypertension is well controlled on the combination of beta blockers and  Cardizem.   In regards to her lower extremity edema, she will increase her Lasix from 40  in the morning to 40 in the morning and 20 at night.  We will have to  recheck her BUN and creatinine.  The  last time they were checked in  September on higher dosages of medication her BUN was 37 and her creatinine  was 2.4.  However, I think in terms of her symptoms of lower extremity edema  she can probably tolerate a little extra Lasix at night.   I will see her back in about 8 weeks.    [name]. [name], MD, [facility]  Electronically Signed   PCN/MedQ  DD: 06/30/2006  DT: 06/30/2006  Job #: [id number]

## 2010-12-31 NOTE — H&P (Signed)
NAMEDELENA, CASEBEER NO.:  [id number]   MEDICAL RECORD NO.:  [id number]          PATIENT TYPE:  EMS   LOCATION:  MAJO                         FACILITY:  [facility]   PHYSICIAN:  Duke Salvia, M.D.  DATE OF BIRTH:  May 15, 1935   DATE OF ADMISSION:  11/02/2004  DATE OF DISCHARGE:                                HISTORY & PHYSICAL   SUMMARY OF HISTORY:  [name] is a 76 year old white female who  presents to [facility] emergency room complaining of multiple symptoms. She  states that since being seen in the ER on October 18, 2004, by Dr. Andee Lineman she  has not had any energy, she has continued to have dyspnea on exertion, and  also experiencing an exertional abdominal heaviness radiating into her chest  just from walking room to room. Symptoms are relieved with rest. She also  notes an increased heart rate with any type of exertion. Today the symptoms  were much worse. While walking to the kitchen she states that she just felt  awful. Everything was much worse than usual and she had associated nausea.  She did not have any vomiting or diaphoresis and she rested without relief.  She called Dr. Andee Lineman on his cell phone and he stated that he would return  her call. However, her symptoms continued to get worse and not be relieved  with rest. Thus, she called EMS and was transported to the ER for  evaluation. On arrival to the emergency room her blood pressure was 84/62  and her heart rate was 125.  Since resting in the emergency room she states  that she feels somewhat better, however if she moves her symptoms will  return.   PAST MEDICAL HISTORY:  Notable for hospitalization from October 08, 2004,  through October 16, 2004, secondary to atrial fibrillation with a rapid rate.  Echocardiogram during that admission showed an ejection fraction of 65%. She  underwent DC cardioversion restoring normal sinus rhythm and was  anticoagulated. During that admission TSH was within  normal limits at 1.447.  She returned to the ER on October 18, 2004, was seen by Dr. Andee Lineman, and was  noted to be in recurrent atrial fibrillation. Dr. Andee Lineman evaluated her and  felt that there was a miscommunication in regards to her medications and  felt that otherwise she was doing well. Thus her medications were clarified  and arrangements for her to follow up with Dr. [name] in the office. Her  office appointment is scheduled for the 27th. Her medical history is also  notable for chronic renal insufficiency with a baseline creatinine of 1.3 to  1.5. Polycystic kidneys. Her renal insufficiency is exacerbated by ACE  inhibitors and over diuresis. She has a history of hypertension, CHF, hiatal  hernia, GERD. Her last lipids on November 06, 2004, showed a total cholesterol  of 173, triglycerides 63, HDL 68, LDL 92.  Her last adenosine Cardiolite was  on August 22, 2003, showing an EF of 70% with no ischemia or scar.   PAST SURGICAL HISTORY:  Notable for lumbar surgery, cholecystectomy,  appendectomy, and hysterectomy.  ALLERGIES:  She has no known drug allergies.   MEDICATIONS:  Prior to admission include Cardizem CD 360 daily, metoprolol  100 mg b.i.d., Lasix 20 mg daily, Kay Ciel 20 daily, warfarin 5 mg daily. It  is noted that she has not had her PT-INR checked since Tuesday.  Aleve 220  p.r.n. and Nexium 40 mg daily.   SOCIAL HISTORY:  The patient resides in [place] alone. She is a retired  Metallurgist. She is currently separated. She has two sons ages 52 and  76. She quit smoking in February 2006 and prior to that a pack per day for  50 years. She denies any alcohol, drugs, herbal medications. She tries to  maintain a low salt diet and she does not exercise.   FAMILY HISTORY:  Notable for the death of her mother at the age of 42 with a  ruptured colon; her father at the age of 64 with an MVA. She has a brother  who also has polycystic kidney disease and is waiting for a  transplant.   REVIEW OF SYSTEMS:  Notable for an approximate 20-pound weight gain in one  month, glasses, recent nose bleeds, hearing loss in the right ear, partial  lower plate and upper plate, wheezing, orthopnea, PND, unable to sleep,  urinary straining, menopause, generalized weakness and anxiety, back  arthralgias, GERD, in addition to the above.   PHYSICAL EXAMINATION:  GENERAL: A well-developed, well-nourished, pleasant,  white female in no apparent distress.  VITAL SIGNS: Temperature 97.2, pulse 111 at this time, respirations 18,  blood pressure is up to 112/55. 96% saturation on room air.  HEENT: Normocephalic and atraumatic. PERRLA. EOMs intact. Glasses and  partial dentures.  NECK: Supple without thyromegaly, adenopathy, JVD, or carotid bruits.  HEART: Irregularly irregular rapid rhythm without murmurs, rubs, clicks, or  gallops. Pulses are symmetrical and intact without abdominal or femoral  bruits.  LUNGS: Symmetrical excursion. Breath sounds are diminished throughout  bilaterally.  SKIN/INTEGUMENT: Intact without rashes or lesions.  ABDOMEN: Obese, bowel sounds present, without organomegaly, masses, or  tenderness.  EXTREMITIES: Negative cyanosis or clubbing. She has 2+ pitting edema in her  lower extremities.  MUSCULOSKELETAL/NEUROLOGIC: Unremarkable.   LABORATORY DATA:  Chest x-ray shows cardiomegaly. No active disease. EKG  shows atrial fibrillation with a ventricular rate of 120, normal axis,  delayed R wave.  Unchanged from October 18, 2004.   H&H 11.9 and 35.6 with normal indices. Platelet count 187,000, WBC 5.6.  Sodium 142, potassium 3.5, BUN 25, creatinine 1.6, glucose 123, BNP 461.7.  PTT 57, PT 34.2, INR 5.5.   IMPRESSION:  1.  Dyspnea on exertion/congestive heart failure related to atrial      fibrillation with a rapid ventricular rate.  2.  Atrial fibrillation with a rapid ventricular rate associated with above      and hypertension. 3.   Supratherapeutic INR. It is notable that she has not had a PT-INR check      since her March 4th discharge, nor during that admission did she receive      Coumadin education.  4.  Normocytic anemia, which is stable.  5.  Chronic renal insufficiency, which is also stable. Please refer to past      medical history.   DISPOSITION:  Dr. Graciela Husbands reviewed the patient's history, spoke with, and  examined the patient. Will admit the patient for diuresis. A Foley catheter  will be placed and removed when her symptoms have improved. We will hold her  Coumadin with her elevated INR. We will continue his home medications. Dr.  Graciela Husbands is started propafenone 225  mg p.o. q.8h. and is scheduling a DC cardioversion for Thursday after  medications are on board. It is noted while in the emergency room I had her  watch atrial fibrillation and Coumadin videos for continued education. The  patient and her son are agreeable to this plan.      EW/MEDQ  D:  11/02/2004  T:  11/02/2004  Job:  [id number]   cc:   [name]  [address]  [place]  [place] [address]  Fax: [phone]   Charlton Haws, M.D.

## 2010-12-31 NOTE — Assessment & Plan Note (Signed)
[facility]                            CARDIOLOGY OFFICE NOTE   [name], [name]                    MRN:          [id number]  DATE:09/21/2006                            DOB:          Jul 15, 1935    [name] returns today for followup.  She has a history of stenting of the  circumflex in 2006.  She has had PAF with sick sinus syndrome.  The last  time I saw her we had converted her, but then she became junctional.  She was fairly hemodynamically stable, and we decided to stop her  Cardizem and lower her Lopressor.  Today, she is in sinus rhythm at a  rate of 55.  She has not had any significant syncope.  She does get  occasional palpitations.  We had another long discussion regarding the  need for pacemaker in the future.  For the time being, I think we can  wait on this, and I also think that we can keep her off Coumadin.  She  continues to get chest pain, however, it is not exertional.  She has not  tried nitroglycerine for it, and I do not think she needs a heart  catheterization at this time.   CURRENT MEDICATIONS:  1. Klor-Con 40 a day.  2. Zantac 150 a day.  3. Lopressor has been cut back to 12.5 once a day.  4. Plavix 75 a day.  5. Lasix 40 a day.  6. Aspirin a day.  7. Imdur 30 a day.  8. Lexapro 10 a day.   Her amiodarone has been stopped.  Her Cardizem has been stopped.   EXAMINATION:  She is in sinus rhythm at a rate of 54.  She has bruising on her hands.  HEENT:  Normal.  There is no carotid bruits.  LUNGS:  Clear.  There is an S1 and S2 with normal heart sounds.  ABDOMEN:  Benign.  LOWER EXTREMITIES:  Intact pulses.  No edema.  EKG shows sinus rhythm without acute changes, and a PR interval of 186.   IMPRESSION:  Coronary disease status post circumflex stent with atypical  pain.  I will see her back in 3 to 4 weeks.  Prefer not to repeat a  heart catheterization at this time.  She will continues her current  medications.   Beta blockers are now cut back as far as they can be,  given her sick sinus syndrome. Patient will continue her aspirin and  Plavix.   She will take nitro for her pain to see if this helps.  We will make  further decision about how to workup her chest pain in 3 to 4 weeks.  For the time being, her sick sinus syndrome is  controlled.  She is maintaining sinus rhythm and no longer junctional.  AV nodal blocking drugs will have to be watched in the future.     [name]. [name], MD, [facility]  Electronically Signed    PCN/MedQ  DD: 09/21/2006  DT: 09/21/2006  Job #: [id number]

## 2010-12-31 NOTE — Cardiovascular Report (Signed)
[name], [name]             ACCOUNT NO.:  [id number]   MEDICAL RECORD NO.:  [id number]          PATIENT TYPE:  INP   LOCATION:  6527                         FACILITY:  [facility]   PHYSICIAN:  Salvadore Farber, M.D. LHCDATE OF BIRTH:  09/03/1934   DATE OF PROCEDURE:  01/19/2005  DATE OF DISCHARGE:                              CARDIAC CATHETERIZATION   PROCEDURE:  Cypher stent placement in the proximal portion of the first  obtuse marginal branch.   INDICATIONS FOR PROCEDURE:  [name] is a 76 year old lady with  paroxysmal atrial fibrillation who presents with stable angina.  She  underwent diagnostic angiography in mid May that demonstrated single vessel  disease involving the proximal portion of a large first obtuse marginal  branch.  Due to her angina which is refractory to medical therapy, she was  referred for percutaneous intervention.   PROCEDURE TECHNIQUE:  Informed consent was obtained.  The patient had been  preloaded on Plavix in the days prior to the procedure.  Under 1% lidocaine  local anesthesia, a 6 French sheath was placed in the right common femoral  artery using the modified Seldinger technique.  Anticoagulation was  initiated with bivalirudin.  ACT was confirmed to be greater than 225  seconds.  A 3.5 guide was advanced over a wire engaged the ostium of the  left main.  A Prowater wire was advanced to the distal vessel without  difficulty.  The lesion was then directly stented using a 2.5 by 13 mm  Cypher deployed at 16 atmospheres.  The stent was then post dilated using a  2.5 by 12 mm Quantum at 16 atmospheres.  There remained a residual stenosis  of approximately 20%.  Therefore, a 2.5 by 8 mm Quantum was advanced to the  mid portion of the stent and inflated to 22 atmospheres.  This reduced the  residual stenosis to 0%.  Repeat angiography demonstrated no residual  stenosis, TIMI III flow to the distal vasculature, and no dissection.  The  catheter  and wires were then removed.  The patient tolerated the procedure  well and was transferred to the holding room in stable condition.   COMPLICATIONS:  None.   IMPRESSION/RECOMMENDATION:  Successful drug eluting stent placement in the  lesion of the first obtuse marginal branch.  The patient will be resumed on  her Coumadin this evening.  She should continue Plavix for at least a year  in combination with Coumadin and aspirin.  Aspirin will be reduced to 81 mg  per day.  January 18, 2005       WED/MEDQ  D:  01/19/2005  T:  01/19/2005  Job:  [id number]   cc:   Charlton Haws, M.D.

## 2010-12-31 NOTE — Assessment & Plan Note (Signed)
[facility]                              CARDIOLOGY OFFICE NOTE   [name]                    MRN:          [id number]  DATE:05/02/2006                            DOB:          August 25, 1934    [name] is here for follow-up.  She has had PAF in the past.  She has  maintained sinus rhythm.  I stopped her Coumadin recently.  She still has  significant bruising in her arms.  She is taking an aspirin a day.  We will  check her platelet count and PT/PTT today.   She has sick sinus syndrome.  She is currently bradycardic with a heart rate  of 41.  This is fairly asymptomatic, although, she does feel sluggish.  She  is on both Cardizem and Toprol.  I told her to cut her Toprol in half and I  would see her back in 6-8 weeks to see what her resting heart rate is.  I  would prefer it to be in the mid to upper 50's.  If need be, we may need to  stop her beta blocker altogether.   However, the last time I saw her, we stopped her Coumadin and Pacerone.  We  will also check her potassium since she is taking that with her Lasix.   Aside from feeling sluggish, she has not had any presyncope, palpitations,  chest pain, or dyspnea.   PHYSICAL EXAMINATION:  GENERAL:  She looks well.  There is bruising on both  arms.  VITAL SIGNS:  Blood pressure 150/80, pulse 41.  LUNGS:  Clear.  Carotids normal.  HEART:  S1 and S2 with systolic ejection murmur.  ABDOMEN:  Benign.  EXTREMITIES:  Intact pulses, no edema.   IMPRESSION:  Significant hypertension and paroxysmal atrial fibrillation  controlled with Cardizem.  Decrease metoprolol dose to 25 mg a day for  excessive bradycardia.  Continue her current aspirin and Plavix for her  ischemic cardiomyopathy.  The patient has had a previous stent to the first  obtuse marginal branch.   I believe this was in June of 2006.  She does not have any significant  ischemia.  Once we get her medications squared away, she  may need a follow-  up Myoview.  Her last Myoview done in July of 2006 was nonischemic with an  EF of 56%.                               [name]. Alexandria Emms, MD, [facility]    PCN/MedQ  DD:  05/02/2006  DT:  05/03/2006  Job #:  [id number]

## 2011-01-05 ENCOUNTER — Encounter: Payer: Self-pay | Admitting: Cardiovascular Disease

## 2011-01-06 ENCOUNTER — Ambulatory Visit (INDEPENDENT_AMBULATORY_CARE_PROVIDER_SITE_OTHER): Payer: Medicare Other | Admitting: Cardiovascular Disease

## 2011-01-06 ENCOUNTER — Encounter: Payer: Self-pay | Admitting: Cardiovascular Disease

## 2011-01-06 ENCOUNTER — Encounter: Payer: Self-pay | Admitting: *Deleted

## 2011-01-06 ENCOUNTER — Telehealth: Payer: Self-pay | Admitting: *Deleted

## 2011-01-06 ENCOUNTER — Ambulatory Visit (INDEPENDENT_AMBULATORY_CARE_PROVIDER_SITE_OTHER): Payer: Medicare Other | Admitting: *Deleted

## 2011-01-06 ENCOUNTER — Encounter: Payer: Medicare Other | Admitting: *Deleted

## 2011-01-06 VITALS — BP 130/60 | HR 72 | Resp 12 | Ht 67.0 in | Wt 186.0 lb

## 2011-01-06 DIAGNOSIS — R079 Chest pain, unspecified: Secondary | ICD-10-CM

## 2011-01-06 DIAGNOSIS — I4891 Unspecified atrial fibrillation: Secondary | ICD-10-CM

## 2011-01-06 DIAGNOSIS — N259 Disorder resulting from impaired renal tubular function, unspecified: Secondary | ICD-10-CM

## 2011-01-06 DIAGNOSIS — I10 Essential (primary) hypertension: Secondary | ICD-10-CM

## 2011-01-06 DIAGNOSIS — I2510 Atherosclerotic heart disease of native coronary artery without angina pectoris: Secondary | ICD-10-CM

## 2011-01-06 LAB — CBC WITH DIFFERENTIAL/PLATELET
Eosinophils Relative: 2.5 % (ref 0.0–5.0)
HCT: 37.7 % (ref 36.0–46.0)
Hemoglobin: 12.9 g/dL (ref 12.0–15.0)
Lymphs Abs: 1.3 10*3/uL (ref 0.7–4.0)
Monocytes Relative: 6.4 % (ref 3.0–12.0)
Neutro Abs: 3 10*3/uL (ref 1.4–7.7)
RDW: 15.6 % — ABNORMAL HIGH (ref 11.5–14.6)
WBC: 4.7 10*3/uL (ref 4.5–10.5)

## 2011-01-06 LAB — BASIC METABOLIC PANEL
GFR: 24.59 mL/min — ABNORMAL LOW (ref >60.00)
Glucose, Bld: 94 mg/dL (ref 70–99)
Potassium: 3.2 mEq/L — ABNORMAL LOW (ref 3.5–5.1)
Sodium: 141 mEq/L (ref 135–145)

## 2011-01-06 NOTE — Assessment & Plan Note (Signed)
Hold Lasix and hydrate for cath.  Baseline Cr around 1.8

## 2011-01-06 NOTE — Patient Instructions (Signed)
Lab today---BMP/CBC 786.50  414.01  Cardiac Catheterization scheduled  for 01/12/11 --see instruction sheet.  Schedule an appointment in the coumadin clinic 1 week after the cardiac cath on 01/12/11.  Schedule an appointment to see Dr [name] about 2 weeks after the cath 01/12/11.

## 2011-01-06 NOTE — Assessment & Plan Note (Signed)
Good rate control with pacer back up.  Hold anticoagulation for cath.  No need for lovenox overlapp

## 2011-01-06 NOTE — Assessment & Plan Note (Signed)
Angina with history of LAD stent 2009.  Cath next Wendsday

## 2011-01-06 NOTE — Progress Notes (Signed)
[name] returns today for followup.  She is a pleasant 76 yo woman with a h/o HTN, CRI, symptomatic bradycardia and heart block.  She is s/p BiV PM.  She has  occaisional palpitations.  No other complaints. She admits to being sedentary.  She has tried to lose some weight.  One of her sons is living off her and causes a lot of distress in her life.  He is unemployed and [name].  I spoke with her other son [name] about intervening in this situation and he will try to be supportive  She has had a stent to LAD and nonischemic myovue in June 2010 She has been having angina.  Taken nitro on 6 occasions last month.  May need RTKR with Cafry.  Last knee surgery was complicated and required long rehab. Has not had rest pain.  She has mild CRF with baseline Cr around 1.8.  Will hold lasix and KCL prior to cath and hydrate morning of.  On coumadin for afib.  Cath next Wendsday with Excell Seltzer and hold coumadin Sunday.  Stat PT on arrival.  Risks discussed and she is willing to proceed.  ROS: Denies fever, malais, weight loss, blurry vision, decreased visual acuity, cough, sputum, SOB, hemoptysis, pleuritic pain, palpitaitons, heartburn, abdominal pain, melena, lower extremity edema, claudication, or rash.   General: Affect appropriate Healthy:  appears stated age HEENT: normal Neck supple with no adenopathy JVP normal no bruits no thyromegaly Lungs clear with no wheezing and good diaphragmatic motion Heart:  S1/S2 no murmur,rub, gallop or click PMI normal Abdomen: benighn, BS positve, no tenderness, no AAA no bruit.  No HSM or HJR Distal pulses intact with no bruits No edema Neuro non-focal Skin warm and dry No muscular weakness   Current Outpatient Prescriptions  Medication Sig Dispense Refill  . ALPRAZolam (XANAX) 0.25 MG tablet Take 0.25 mg by mouth 3 (three) times daily as needed.        Marland Kitchen amitriptyline-chlordiazePOXIDE (LIMBITROL) 5-12.5 MG per tablet Take 1 tablet by mouth daily.         Marland Kitchen amLODipine (NORVASC) 5 MG tablet Take 5 mg by mouth daily.        Marland Kitchen aspirin 81 MG tablet Take 81 mg by mouth daily.        . calcitRIOL (ROCALTROL) 0.25 MCG capsule Take 0.25 mcg by mouth 2 (two) times daily.       . ferrous gluconate (FERGON) 325 MG tablet Take 325 mg by mouth daily with breakfast.        . FLUoxetine (PROZAC) 20 MG capsule Take 20 mg by mouth daily.        . furosemide (LASIX) 80 MG tablet Take 80 mg by mouth daily.        Marland Kitchen loperamide (IMODIUM) 2 MG capsule Take 2 mg by mouth as needed.        . lovastatin (MEVACOR) 10 MG tablet Take 10 mg by mouth at bedtime.        . magnesium oxide (MAG-OX) 400 MG tablet Take 400 mg by mouth 2 (two) times daily.        . metoprolol (LOPRESSOR) 50 MG tablet Take 100 mg by mouth 2 (two) times daily.       Marland Kitchen oxyCODONE-acetaminophen (PERCOCET) 5-325 MG per tablet Take 1 tablet by mouth every 4 (four) hours as needed.        . pantoprazole (PROTONIX) 40 MG tablet Take 40 mg by mouth daily.        Marland Kitchen  potassium chloride (KLOR-CON) 10 MEQ CR tablet Take 10 mEq by mouth daily.       . sucralfate (CARAFATE) 1 G tablet Take 1 g by mouth 3 (three) times daily.        Marland Kitchen warfarin (COUMADIN) 2 MG tablet Take by mouth as directed.          Allergies  Amiodarone  Electrocardiogram:  Assessment and Plan

## 2011-01-06 NOTE — Assessment & Plan Note (Signed)
Well controlled.  Continue current medications and low sodium Dash type diet.    

## 2011-01-06 NOTE — Telephone Encounter (Signed)
Notes Recorded by Wendall Stade, MD on 01/06/2011 at 5:58 PM Cath scheduled for 5/30 Going to take KCL over weekend and hold Lasix starting Sunday. Stat BMET and PT 5/30 Ok to due cath if Cr under 2.0 Baseline 1.8     I discussed Dr Fabio Bering recommendations with pt and she verbalized understanding.

## 2011-01-12 ENCOUNTER — Ambulatory Visit (HOSPITAL_COMMUNITY)
Admission: RE | Admit: 2011-01-12 | Discharge: 2011-01-12 | Disposition: A | Discharge status: Home / Self Care | Payer: Medicare Other | Source: Ambulatory Visit | Attending: Cardiovascular Disease | Admitting: Cardiovascular Disease

## 2011-01-12 DIAGNOSIS — R079 Chest pain, unspecified: Secondary | ICD-10-CM | POA: Insufficient documentation

## 2011-01-12 DIAGNOSIS — I2510 Atherosclerotic heart disease of native coronary artery without angina pectoris: Secondary | ICD-10-CM

## 2011-01-12 DIAGNOSIS — I129 Hypertensive chronic kidney disease with stage 1 through stage 4 chronic kidney disease, or unspecified chronic kidney disease: Secondary | ICD-10-CM | POA: Insufficient documentation

## 2011-01-12 DIAGNOSIS — N189 Chronic kidney disease, unspecified: Secondary | ICD-10-CM | POA: Insufficient documentation

## 2011-01-12 DIAGNOSIS — Z9861 Coronary angioplasty status: Secondary | ICD-10-CM | POA: Insufficient documentation

## 2011-01-12 LAB — BASIC METABOLIC PANEL
CO2: 26 mEq/L (ref 19–32)
Chloride: 100 mEq/L (ref 96–112)
Glucose, Bld: 92 mg/dL (ref 70–99)
Potassium: 2.9 mEq/L — ABNORMAL LOW (ref 3.5–5.1)
Sodium: 136 mEq/L (ref 135–145)

## 2011-01-14 ENCOUNTER — Telehealth: Payer: Self-pay | Admitting: Cardiovascular Disease

## 2011-01-14 NOTE — Telephone Encounter (Signed)
Pt d/c from 2 days ago and was told to get PT checked 2 days after starting back.  That would be today.  She already has appt. 6-7.  Is it ok for her to wait.

## 2011-01-14 NOTE — Telephone Encounter (Signed)
Returned patient phone call concerning anticoagulation clinic appointment and coumadin dosing following her heart catheterization done on 01/12/11.  Patient was instructed to take 4mg  coumadin today and 3mg  coumadin tomorrow, then resume pre-procedure dosing.  Patient is to return to the clinic 01/20/11 for recheck of INR.  Patient repeated instructions and stated understanding.  Windell Hummingbird, RN

## 2011-01-19 NOTE — Cardiovascular Report (Signed)
NAMEWILLIS, KUIPERS             ACCOUNT NO.:  [id number]  MEDICAL RECORD NO.:  [id number]           PATIENT TYPE:  O  LOCATION:  [facility]                         FACILITY:  [facility]  PHYSICIAN:  Lorine Bears, MD     DATE OF BIRTH:  1935/02/12  DATE OF PROCEDURE: DATE OF DISCHARGE:  01/12/2011                           CARDIAC CATHETERIZATION   PRIMARY CARDIOLOGIST:  Theron Arista C. [name], MD, [facility]  PROCEDURES PERFORMED: 1. Left heart catheterization. 2. Coronary angiography.  INDICATIONS AND CLINICAL HISTORY:  This is a 76 year old female with known history of coronary artery disease, status post angioplasty and stent placement to the left circumflex as well as the left anterior descending artery.  She presented with increased symptoms of chest pain. Due to her known history of coronary artery disease and recurrent chest pain, cardiac catheterization was recommended.  The patient is known to have chronic kidney disease with a baseline creatinine around 2.  I explained to the patient that she is at higher risk of contrast-induced nephropathy.  She was brought earlier and underwent hydration before the procedure.  Risks, benefits and alternatives were discussed with the patient.  ACCESS:  Right femoral artery.  CONTRAST USE:  28 mL.  STUDY DETAILS:  A standard informed consent was obtained.  The right groin area was prepped in a sterile fashion.  It was anesthetized with 1% lidocaine.  A 5-French sheath was placed in the right femoral artery after an anterior puncture.  Coronary angiography was performed with a JL-4 and JR-4 catheters.  Left ventricular angiography was not performed due to chronic kidney disease.  However, a pigtail catheter was used to record the pressure in the left ventricle as well as pullback across the aortic valve.  The patient tolerated the procedure well with no immediate complications.  STUDY FINDINGS:  Hemodynamic findings:  Left ventricular pressure  is 154/19 with a left ventricular end-diastolic pressure of 24 mmHg. Central aortic pressure is 155/82 with a mean pressure of 111 mmHg.  Left ventricular angiography was not performed due to chronic kidney disease.  CORONARY ANGIOGRAPHY:    Left main coronary artery:  The vessel was overall short and free of any significant disease.  Left anterior descending artery:  The vessel is noted to be heavily calcified.  There is a stent in the proximal segment which is patent with minimal restenosis.  In the proximal segment distal to the stent, there is 30% stenosis.  The mid LAD has another 30% stenosis after the first septal.  The rest of the LAD has mild atherosclerosis without obstructive disease.  First diagonal is small in size and free of significant disease.  Second diagonal is normal in size with mild ostial disease.  Third diagonal is medium in size and free of significant disease.  First septal branch is a medium size branch with an 80% ostial stenosis.  Left circumflex artery:  The vessel is medium in size and nondominant.It is moderately calcified.  A stent is noted in OM-1, which has diffuse 10-20% in-stent restenosis.  The mid circumflex has a 20% stenosis, but otherwise no other obstructive disease.  Right coronary artery:  The vessel is heavily calcified.  Proximally, there was a diffuse 30-40% disease.  In the mid segment, there is 50-60% stenosis, which does not seem to be significantly changed from previous cardiac catheterization.  The rest of the right coronary artery is free of significant disease.  STUDY CONCLUSIONS: 1. No evidence of obstructive coronary artery disease. 2. Patent stents in the left anterior descending artery as well as OM-     1.  There is moderate mid RCA stenosis, which does not seem to be     significantly different from most recent cardiac catheterization. 3. Moderate hypertension and elevated left ventricular end-diastolic      pressure.  RECOMMENDATIONS:  Recommend continuing medical therapy for coronary artery disease.  We will hydrate the patient after the procedure.  Only 28 mL of contrast was used.     Lorine Bears, MD     MA/MEDQ  D:  01/12/2011  T:  01/13/2011  Job:  [id number]  cc:   [name]. [name], MD, [facility]  Electronically Signed by Lorine Bears MD on 01/19/2011 09:54:36 AM

## 2011-01-20 ENCOUNTER — Encounter: Payer: Medicare Other | Admitting: *Deleted

## 2011-01-25 ENCOUNTER — Encounter: Payer: Medicare Other | Admitting: *Deleted

## 2011-01-27 ENCOUNTER — Ambulatory Visit: Payer: Medicare Other | Admitting: Cardiovascular Disease

## 2011-01-27 ENCOUNTER — Encounter: Payer: Medicare Other | Admitting: *Deleted

## 2011-03-24 ENCOUNTER — Encounter: Payer: Medicare Other | Admitting: *Deleted

## 2011-03-28 ENCOUNTER — Encounter: Payer: Medicare Other | Admitting: *Deleted

## 2011-03-31 ENCOUNTER — Ambulatory Visit (INDEPENDENT_AMBULATORY_CARE_PROVIDER_SITE_OTHER): Payer: Medicare Other | Admitting: *Deleted

## 2011-03-31 DIAGNOSIS — I4891 Unspecified atrial fibrillation: Secondary | ICD-10-CM

## 2011-04-14 ENCOUNTER — Encounter: Payer: Self-pay | Admitting: Cardiovascular Disease

## 2011-04-14 ENCOUNTER — Ambulatory Visit (INDEPENDENT_AMBULATORY_CARE_PROVIDER_SITE_OTHER): Payer: Medicare Other | Admitting: Cardiovascular Disease

## 2011-04-14 ENCOUNTER — Ambulatory Visit (INDEPENDENT_AMBULATORY_CARE_PROVIDER_SITE_OTHER): Payer: Medicare Other | Admitting: *Deleted

## 2011-04-14 ENCOUNTER — Ambulatory Visit (INDEPENDENT_AMBULATORY_CARE_PROVIDER_SITE_OTHER): Payer: Self-pay | Admitting: Cardiovascular Disease

## 2011-04-14 DIAGNOSIS — I10 Essential (primary) hypertension: Secondary | ICD-10-CM

## 2011-04-14 DIAGNOSIS — R0989 Other specified symptoms and signs involving the circulatory and respiratory systems: Secondary | ICD-10-CM

## 2011-04-14 DIAGNOSIS — I2510 Atherosclerotic heart disease of native coronary artery without angina pectoris: Secondary | ICD-10-CM

## 2011-04-14 DIAGNOSIS — I4891 Unspecified atrial fibrillation: Secondary | ICD-10-CM

## 2011-04-14 DIAGNOSIS — Z950 Presence of cardiac pacemaker: Secondary | ICD-10-CM

## 2011-04-14 LAB — PROTIME-INR: INR: 2.1 ratio — ABNORMAL HIGH (ref 0.8–1.0)

## 2011-04-14 NOTE — Progress Notes (Addendum)
Ms. Colclasure returns today for followup. She is a pleasant 76 yo woman with a h/o HTN, CRI, symptomatic bradycardia and heart block. She is s/p BiV PM. She has occaisional palpitations. No other complaints. She admits to being sedentary. She has tried to lose some weight. One of her sons is living off her and causes a lot of distress in her life. He is unemployed and [name]. I spoke with her other son [name] about intervening in this situation and he will try to be supportive  She has had a stent to LAD and nonischemic myovue in June 2010   Still contemplating knee surgery.  Told her her heart and kidneys were ok for surgery at this time.  She will think about it  Cath 5/12 Arida for chest pain No evidence of obstructive coronary artery disease.  2. Patent stents in the left anterior descending artery as well as OM-  1. There is moderate mid RCA stenosis, which does not seem to be  significantly different from most recent cardiac catheterization.  3. Moderate hypertension and elevated left ventricular end-diastolic  pressure.   ROS: Denies fever, malais, weight loss, blurry vision, decreased visual acuity, cough, sputum, SOB, hemoptysis, pleuritic pain, palpitaitons, heartburn, abdominal pain, melena, lower extremity edema, claudication, or rash.  All other systems reviewed and negative  General: Affect appropriate Healthy:  appears stated age HEENT: normal Neck supple with no adenopathy JVP normal no bruits no thyromegaly Lungs clear with no wheezing and good diaphragmatic motion Heart:  S1/S2 no murmur,rub, gallop or click PMI normal Abdomen: benighn, BS positve, no tenderness, no AAA no bruit.  No HSM or HJR Distal pulses intact with no bruits No edema Neuro non-focal Skin warm and dry No muscular weakness   Current Outpatient Prescriptions  Medication Sig Dispense Refill  . ALPRAZolam (XANAX) 0.25 MG tablet Take 0.25 mg by mouth 3 (three) times daily as needed.        Marland Kitchen  amitriptyline-chlordiazePOXIDE (LIMBITROL) 5-12.5 MG per tablet Take 1 tablet by mouth daily.        Marland Kitchen amLODipine (NORVASC) 5 MG tablet Take 5 mg by mouth daily.        Marland Kitchen aspirin 81 MG tablet Take 81 mg by mouth daily.        . calcitRIOL (ROCALTROL) 0.25 MCG capsule Take 0.25 mcg by mouth 2 (two) times daily.       . ferrous gluconate (FERGON) 325 MG tablet Take 325 mg by mouth daily with breakfast.        . FLUoxetine (PROZAC) 20 MG capsule Take 20 mg by mouth daily.        . furosemide (LASIX) 80 MG tablet 1/2 tab po qd      . loperamide (IMODIUM) 2 MG capsule Take 2 mg by mouth as needed.        . lovastatin (MEVACOR) 10 MG tablet Take 10 mg by mouth at bedtime.        . magnesium oxide (MAG-OX) 400 MG tablet Take 400 mg by mouth daily.       . metoprolol (LOPRESSOR) 50 MG tablet Take 100 mg by mouth 2 (two) times daily.       Marland Kitchen oxyCODONE-acetaminophen (PERCOCET) 5-325 MG per tablet Take 1 tablet by mouth every 4 (four) hours as needed.        . pantoprazole (PROTONIX) 40 MG tablet Take 40 mg by mouth daily.        . potassium chloride (KLOR-CON) 10 MEQ  CR tablet Take 10 mEq by mouth daily.       . sucralfate (CARAFATE) 1 G tablet Take 1 g by mouth 3 (three) times daily.        Marland Kitchen warfarin (COUMADIN) 2 MG tablet Take by mouth as directed.          Allergies  Amiodarone  Electrocardiogram:   Afib/flutter with escape V pacing at rate of 70  Assessment and Plan

## 2011-04-14 NOTE — Patient Instructions (Signed)
Your physician wants you to follow-up in: 6 MONTHS You will receive a reminder letter in the mail two months in advance. If you don't receive a letter, please call our office to schedule the follow-up appointment. 

## 2011-04-14 NOTE — Assessment & Plan Note (Signed)
Normal function  F/U Dr Ladona Ridgel

## 2011-04-14 NOTE — Assessment & Plan Note (Signed)
Well controlled.  Continue current medications and low sodium Dash type diet.    

## 2011-04-14 NOTE — Assessment & Plan Note (Signed)
Good rate control and anticoagulation  

## 2011-04-14 NOTE — Assessment & Plan Note (Signed)
Stable with no angina and good activity level.  Continue medical Rx  

## 2011-04-21 ENCOUNTER — Encounter: Payer: Medicare Other | Admitting: *Deleted

## 2011-05-06 LAB — POCT I-STAT 4, (NA,K, GLUC, HGB,HCT)
Glucose, Bld: 77
Operator id: 181601
Potassium: 4.1

## 2011-05-10 ENCOUNTER — Encounter: Payer: Medicare Other | Admitting: *Deleted

## 2011-05-12 LAB — BASIC METABOLIC PANEL
BUN: 18
BUN: 20
BUN: 27 — ABNORMAL HIGH
CO2: 24
CO2: 24
CO2: 24
CO2: 24
CO2: 25
CO2: 26
Calcium: 8.5
Calcium: 8.5
Calcium: 8.5
Calcium: 9.4
Chloride: 107
Chloride: 110
Chloride: 111
Chloride: 113 — ABNORMAL HIGH
Chloride: 113 — ABNORMAL HIGH
Chloride: 114 — ABNORMAL HIGH
Creatinine, Ser: 1.4 — ABNORMAL HIGH
Creatinine, Ser: 1.42 — ABNORMAL HIGH
Creatinine, Ser: 1.43 — ABNORMAL HIGH
Creatinine, Ser: 1.57 — ABNORMAL HIGH
Creatinine, Ser: 1.64 — ABNORMAL HIGH
GFR calc Af Amer: 38 — ABNORMAL LOW
GFR calc Af Amer: 39 — ABNORMAL LOW
GFR calc Af Amer: 44 — ABNORMAL LOW
GFR calc non Af Amer: 36 — ABNORMAL LOW
Glucose, Bld: 108 — ABNORMAL HIGH
Glucose, Bld: 93
Glucose, Bld: 96
Potassium: 3.6
Potassium: 4.3
Sodium: 138
Sodium: 142

## 2011-05-12 LAB — CBC
HCT: 27.7 — ABNORMAL LOW
HCT: 36.7
HCT: 37.3
Hemoglobin: 13.7
Hemoglobin: 9.7 — ABNORMAL LOW
MCHC: 33.6
MCHC: 34
MCHC: 34.1
MCHC: 35
MCV: 88.5
MCV: 88.6
MCV: 88.7
MCV: 88.8
MCV: 89.1
MCV: 89.4
MCV: 89.8
Platelets: 117 — ABNORMAL LOW
Platelets: 120 — ABNORMAL LOW
Platelets: 168
RBC: 3.12 — ABNORMAL LOW
RBC: 3.13 — ABNORMAL LOW
RBC: 3.43 — ABNORMAL LOW
RBC: 4.2
RDW: 15.4
RDW: 15.5
RDW: 15.8 — ABNORMAL HIGH
WBC: 4.1
WBC: 5
WBC: 5.4

## 2011-05-12 LAB — PREPARE RBC (CROSSMATCH)

## 2011-05-12 LAB — POCT I-STAT, CHEM 8
BUN: 30 — ABNORMAL HIGH
Calcium, Ion: 1.13
Creatinine, Ser: 1.9 — ABNORMAL HIGH
Sodium: 140
TCO2: 27

## 2011-05-12 LAB — POCT CARDIAC MARKERS
CKMB, poc: <1 — ABNORMAL LOW
Operator id: 272551
Troponin i, poc: <0.05

## 2011-05-12 LAB — TYPE AND SCREEN: ABO/RH(D): O POS

## 2011-05-12 LAB — APTT: aPTT: 34

## 2011-05-12 LAB — CARDIAC PANEL(CRET KIN+CKTOT+MB+TROPI): Troponin I: <0.01

## 2011-05-24 LAB — PROTIME-INR
INR: 1
Prothrombin Time: 13.3

## 2011-05-24 LAB — BASIC METABOLIC PANEL
BUN: 35 — ABNORMAL HIGH
CO2: 21
Calcium: 8.7
Chloride: 102
Creatinine, Ser: 2.2 — ABNORMAL HIGH
GFR calc Af Amer: 27 — ABNORMAL LOW

## 2011-05-24 LAB — CBC
Platelets: 248
RDW: 19.6 — ABNORMAL HIGH
WBC: 5.9

## 2011-05-24 LAB — APTT: aPTT: 30

## 2011-05-26 LAB — DIFFERENTIAL
Basophils Relative: 0
Eosinophils Absolute: 0.2
Lymphs Abs: 1.1
Monocytes Relative: 9
Neutro Abs: 3.3
Neutrophils Relative %: 66

## 2011-05-26 LAB — CBC
MCV: 72.8 — ABNORMAL LOW
Platelets: 240
RBC: 4.55
WBC: 5

## 2011-05-26 LAB — BASIC METABOLIC PANEL
BUN: 47 — ABNORMAL HIGH
Calcium: 7.9 — ABNORMAL LOW
Creatinine, Ser: 1.99 — ABNORMAL HIGH
GFR calc Af Amer: 30 — ABNORMAL LOW

## 2011-05-26 LAB — SEDIMENTATION RATE: Sed Rate: 15

## 2011-05-30 LAB — CBC
HCT: 30.3 — ABNORMAL LOW
HCT: 30.8 — ABNORMAL LOW
HCT: 31.3 — ABNORMAL LOW
HCT: 31.5 — ABNORMAL LOW
Hemoglobin: 10.1 — ABNORMAL LOW
Hemoglobin: 10.3 — ABNORMAL LOW
Hemoglobin: 10.5 — ABNORMAL LOW
MCHC: 33.5
MCV: 83.3
MCV: 84.2
MCV: 84.8
Platelets: 155
Platelets: 196
Platelets: 203
RBC: 3.57 — ABNORMAL LOW
RBC: 3.69 — ABNORMAL LOW
RBC: 3.74 — ABNORMAL LOW
RDW: 14.8 — ABNORMAL HIGH
RDW: 15.3 — ABNORMAL HIGH
WBC: 3.1 — ABNORMAL LOW
WBC: 3.7 — ABNORMAL LOW
WBC: 4.5

## 2011-05-30 LAB — HEPARIN LEVEL (UNFRACTIONATED)
Heparin Unfractionated: 0.31
Heparin Unfractionated: 0.49
Heparin Unfractionated: 0.54

## 2011-05-30 LAB — PROTIME-INR
INR: 1.1
INR: 1.4
INR: 2.6 — ABNORMAL HIGH
Prothrombin Time: 14.3
Prothrombin Time: 29.6 — ABNORMAL HIGH
Prothrombin Time: 37.7 — ABNORMAL HIGH

## 2011-05-30 LAB — URINALYSIS, ROUTINE W REFLEX MICROSCOPIC
Bilirubin Urine: NEGATIVE
Glucose, UA: NEGATIVE
Ketones, ur: NEGATIVE
Nitrite: NEGATIVE
Nitrite: NEGATIVE
Protein, ur: 100 — AB
Specific Gravity, Urine: 1.007
Urobilinogen, UA: 0.2
pH: 6.5

## 2011-05-30 LAB — COMPREHENSIVE METABOLIC PANEL
Alkaline Phosphatase: 64
BUN: 40 — ABNORMAL HIGH
Chloride: 99
GFR calc non Af Amer: 19 — ABNORMAL LOW
Glucose, Bld: 103 — ABNORMAL HIGH
Potassium: 3.3 — ABNORMAL LOW
Total Bilirubin: 0.7

## 2011-05-30 LAB — URINE MICROSCOPIC-ADD ON

## 2011-05-30 LAB — BASIC METABOLIC PANEL
BUN: 37 — ABNORMAL HIGH
CO2: 28
Calcium: 8.5
Chloride: 101
Chloride: 99
Creatinine, Ser: 2.38 — ABNORMAL HIGH
GFR calc Af Amer: 24 — ABNORMAL LOW
GFR calc non Af Amer: 19 — ABNORMAL LOW
Glucose, Bld: 121 — ABNORMAL HIGH
Potassium: 3.6
Sodium: 132 — ABNORMAL LOW

## 2011-05-30 LAB — SODIUM, URINE, RANDOM: Sodium, Ur: 21

## 2011-05-30 LAB — FERRITIN: Ferritin: 29 (ref 10–291)

## 2011-05-30 LAB — CREATININE, URINE, RANDOM: Creatinine, Urine: 93.2

## 2011-05-30 LAB — IRON AND TIBC: TIBC: 335

## 2011-05-30 LAB — LIPASE, BLOOD: Lipase: 22

## 2011-05-30 LAB — AMYLASE: Amylase: 30

## 2011-05-30 LAB — URINE CULTURE: Colony Count: >=100000

## 2011-05-30 LAB — PHOSPHORUS: Phosphorus: 4.6

## 2011-05-31 LAB — CBC
Hemoglobin: 10.4 — ABNORMAL LOW
Hemoglobin: 10.8 — ABNORMAL LOW
MCHC: 33.2
MCHC: 33.3
MCV: 84.2
MCV: 86.3
RBC: 3.61 — ABNORMAL LOW
RBC: 3.87
RDW: 14.7 — ABNORMAL HIGH
WBC: 5.2

## 2011-05-31 LAB — BASIC METABOLIC PANEL
CO2: 24
Calcium: 8.4
Chloride: 106
Creatinine, Ser: 2 — ABNORMAL HIGH
GFR calc Af Amer: 30 — ABNORMAL LOW
Glucose, Bld: 99

## 2011-05-31 LAB — DIFFERENTIAL
Basophils Absolute: 0
Basophils Relative: 1
Eosinophils Absolute: 0.1
Monocytes Absolute: 0.4
Monocytes Relative: 13 — ABNORMAL HIGH
Neutro Abs: 2
Neutrophils Relative %: 62

## 2011-05-31 LAB — PROTIME-INR: INR: 1.1

## 2011-06-01 LAB — DIFFERENTIAL
Basophils Absolute: 0
Basophils Absolute: 0
Eosinophils Relative: 3
Lymphocytes Relative: 15
Lymphocytes Relative: 19
Lymphs Abs: 0.7
Lymphs Abs: 0.8
Monocytes Absolute: 0.4
Monocytes Absolute: 0.4
Monocytes Relative: 9
Neutro Abs: 3
Neutro Abs: 3.5

## 2011-06-01 LAB — PROTIME-INR
INR: 1.2
INR: 1.7 — ABNORMAL HIGH
Prothrombin Time: 20.5 — ABNORMAL HIGH
Prothrombin Time: 37 — ABNORMAL HIGH
Prothrombin Time: 39.5 — ABNORMAL HIGH
Prothrombin Time: >90 — ABNORMAL HIGH

## 2011-06-01 LAB — CROSSMATCH: Antibody Screen: NEGATIVE

## 2011-06-01 LAB — CBC
HCT: 19.9 — ABNORMAL LOW
HCT: 24.7 — ABNORMAL LOW
HCT: 30.4 — ABNORMAL LOW
HCT: 30.7 — ABNORMAL LOW
Hemoglobin: 10.2 — ABNORMAL LOW
Hemoglobin: 10.4 — ABNORMAL LOW
Hemoglobin: 8.4 — ABNORMAL LOW
MCHC: 33.3
MCHC: 33.7
MCV: 84.6
MCV: 86.8
MCV: 87.2
Platelets: 130 — ABNORMAL LOW
Platelets: 179
RBC: 2.89 — ABNORMAL LOW
RBC: 3.5 — ABNORMAL LOW
RBC: 3.52 — ABNORMAL LOW
RBC: 3.63 — ABNORMAL LOW
RDW: 15.3 — ABNORMAL HIGH
RDW: 15.4 — ABNORMAL HIGH
RDW: 15.9 — ABNORMAL HIGH
WBC: 4.2
WBC: 4.3
WBC: 4.9

## 2011-06-01 LAB — HEMOGLOBIN AND HEMATOCRIT, BLOOD
HCT: 24.8 — ABNORMAL LOW
Hemoglobin: 8.4 — ABNORMAL LOW

## 2011-06-01 LAB — BASIC METABOLIC PANEL
BUN: 62 — ABNORMAL HIGH
BUN: 82 — ABNORMAL HIGH
CO2: 20
CO2: 26
Calcium: 8.6
Chloride: 111
Chloride: 113 — ABNORMAL HIGH
Creatinine, Ser: 2.1 — ABNORMAL HIGH
Creatinine, Ser: 2.24 — ABNORMAL HIGH
GFR calc Af Amer: 28 — ABNORMAL LOW
GFR calc Af Amer: 51 — ABNORMAL LOW
GFR calc non Af Amer: 23 — ABNORMAL LOW
GFR calc non Af Amer: 42 — ABNORMAL LOW
Glucose, Bld: 112 — ABNORMAL HIGH
Potassium: 3.6
Potassium: 3.6
Potassium: 3.7
Sodium: 142

## 2011-06-01 LAB — PREPARE RBC (CROSSMATCH)

## 2011-06-01 LAB — PREPARE FRESH FROZEN PLASMA

## 2011-06-02 LAB — PROTIME-INR
INR: 1
INR: 1.2
INR: 1.2
INR: 1.8 — ABNORMAL HIGH
INR: 2.8 — ABNORMAL HIGH
Prothrombin Time: 21.9 — ABNORMAL HIGH
Prothrombin Time: 31.1 — ABNORMAL HIGH

## 2011-06-02 LAB — T4, FREE: Free T4: 1.58

## 2011-06-02 LAB — APTT
aPTT: 184 — ABNORMAL HIGH
aPTT: >200

## 2011-06-02 LAB — CK TOTAL AND CKMB (NOT AT ARMC)
CK, MB: 1.7
Relative Index: INVALID

## 2011-06-02 LAB — POCT CARDIAC MARKERS
Myoglobin, poc: 93.2
Operator id: 279831

## 2011-06-02 LAB — BASIC METABOLIC PANEL
BUN: 24 — ABNORMAL HIGH
Calcium: 8.6
Chloride: 107
Chloride: 107
GFR calc Af Amer: 37 — ABNORMAL LOW
GFR calc Af Amer: 37 — ABNORMAL LOW
GFR calc non Af Amer: 25 — ABNORMAL LOW
GFR calc non Af Amer: 30 — ABNORMAL LOW
GFR calc non Af Amer: 31 — ABNORMAL LOW
Glucose, Bld: 168 — ABNORMAL HIGH
Glucose, Bld: 89
Potassium: 3.5
Potassium: 3.9
Sodium: 140
Sodium: 140
Sodium: 141

## 2011-06-02 LAB — URINALYSIS, ROUTINE W REFLEX MICROSCOPIC
Ketones, ur: NEGATIVE
Nitrite: NEGATIVE
Protein, ur: 30 — AB
pH: 6

## 2011-06-02 LAB — DIFFERENTIAL
Basophils Relative: 1
Eosinophils Absolute: 0.2
Eosinophils Relative: 4
Monocytes Absolute: 0.4
Monocytes Relative: 8

## 2011-06-02 LAB — I-STAT 8, (EC8 V) (CONVERTED LAB)
BUN: 25 — ABNORMAL HIGH
Bicarbonate: 22
Chloride: 107
Glucose, Bld: 91
pCO2, Ven: 36.5 — ABNORMAL LOW
pH, Ven: 7.389 — ABNORMAL HIGH

## 2011-06-02 LAB — CBC
HCT: 34.8 — ABNORMAL LOW
Hemoglobin: 11.5 — ABNORMAL LOW
Hemoglobin: 11.6 — ABNORMAL LOW
Hemoglobin: 11.6 — ABNORMAL LOW
Hemoglobin: 11.9 — ABNORMAL LOW
Hemoglobin: 12.3
MCHC: 33.1
MCHC: 33.6
MCV: 83.8
MCV: 83.8
Platelets: 132 — ABNORMAL LOW
Platelets: 135 — ABNORMAL LOW
RBC: 4.16
RBC: 4.42
RDW: 15.2 — ABNORMAL HIGH
RDW: 15.3 — ABNORMAL HIGH
RDW: 15.7 — ABNORMAL HIGH
WBC: 3.7 — ABNORMAL LOW
WBC: 3.7 — ABNORMAL LOW
WBC: 3.8 — ABNORMAL LOW

## 2011-06-02 LAB — CARDIAC PANEL(CRET KIN+CKTOT+MB+TROPI)
CK, MB: 2
Total CK: 49
Troponin I: 0.06
Troponin I: 0.07 — ABNORMAL HIGH

## 2011-06-02 LAB — MAGNESIUM: Magnesium: 1.5

## 2011-06-02 LAB — HEPARIN LEVEL (UNFRACTIONATED): Heparin Unfractionated: 1.07 — ABNORMAL HIGH

## 2011-06-02 LAB — POCT I-STAT CREATININE
Creatinine, Ser: 2 — ABNORMAL HIGH
Operator id: 279831

## 2011-06-02 LAB — TSH: TSH: 0.294 — ABNORMAL LOW

## 2011-06-02 LAB — T3, FREE: T3, Free: 2.6 (ref 2.3–4.2)

## 2011-09-13 ENCOUNTER — Telehealth: Payer: Self-pay | Admitting: *Deleted

## 2011-09-13 NOTE — Telephone Encounter (Signed)
Patient has not been monitored in coumadin clinic for some time, have called patient to see if patient is off coumadin, being managed by another provider or has just missed appointments. Will also contact patient via mail to contact us with an update on coumadin management.

## 2011-09-19 ENCOUNTER — Encounter: Payer: Medicare Other | Admitting: *Deleted

## 2011-09-30 ENCOUNTER — Encounter: Payer: Self-pay | Admitting: *Deleted

## 2011-10-11 ENCOUNTER — Ambulatory Visit: Payer: Medicare Other | Admitting: Cardiovascular Disease

## 2011-10-24 ENCOUNTER — Other Ambulatory Visit (HOSPITAL_COMMUNITY): Payer: Self-pay | Admitting: Family Medicine

## 2011-10-24 DIAGNOSIS — IMO0001 Reserved for inherently not codable concepts without codable children: Secondary | ICD-10-CM

## 2011-10-26 ENCOUNTER — Ambulatory Visit (HOSPITAL_COMMUNITY)
Admission: RE | Admit: 2011-10-26 | Discharge: 2011-10-26 | Disposition: A | Discharge status: Home / Self Care | Payer: Medicare Other | Source: Ambulatory Visit | Attending: Family Medicine | Admitting: Family Medicine

## 2011-10-26 DIAGNOSIS — K219 Gastro-esophageal reflux disease without esophagitis: Secondary | ICD-10-CM | POA: Insufficient documentation

## 2011-10-26 DIAGNOSIS — K449 Diaphragmatic hernia without obstruction or gangrene: Secondary | ICD-10-CM | POA: Insufficient documentation

## 2011-10-26 DIAGNOSIS — R1310 Dysphagia, unspecified: Secondary | ICD-10-CM | POA: Insufficient documentation

## 2011-10-26 DIAGNOSIS — K209 Esophagitis, unspecified without bleeding: Secondary | ICD-10-CM | POA: Insufficient documentation

## 2011-10-29 ENCOUNTER — Other Ambulatory Visit: Payer: Self-pay

## 2011-10-29 ENCOUNTER — Inpatient Hospital Stay (HOSPITAL_COMMUNITY)
Admission: EM | Admit: 2011-10-29 | Discharge: 2011-11-01 | DRG: 603 | Disposition: A | Discharge status: Home Health | Payer: Medicare Other | Source: Ambulatory Visit | Attending: Internal Medicine | Admitting: Internal Medicine

## 2011-10-29 ENCOUNTER — Encounter (HOSPITAL_COMMUNITY): Payer: Self-pay

## 2011-10-29 DIAGNOSIS — I129 Hypertensive chronic kidney disease with stage 1 through stage 4 chronic kidney disease, or unspecified chronic kidney disease: Secondary | ICD-10-CM | POA: Diagnosis present

## 2011-10-29 DIAGNOSIS — F3289 Other specified depressive episodes: Secondary | ICD-10-CM | POA: Diagnosis present

## 2011-10-29 DIAGNOSIS — N259 Disorder resulting from impaired renal tubular function, unspecified: Secondary | ICD-10-CM | POA: Diagnosis present

## 2011-10-29 DIAGNOSIS — L02219 Cutaneous abscess of trunk, unspecified: Principal | ICD-10-CM | POA: Diagnosis present

## 2011-10-29 DIAGNOSIS — L0390 Cellulitis, unspecified: Secondary | ICD-10-CM

## 2011-10-29 DIAGNOSIS — D649 Anemia, unspecified: Secondary | ICD-10-CM | POA: Diagnosis present

## 2011-10-29 DIAGNOSIS — I4891 Unspecified atrial fibrillation: Secondary | ICD-10-CM | POA: Diagnosis present

## 2011-10-29 DIAGNOSIS — L03311 Cellulitis of abdominal wall: Secondary | ICD-10-CM | POA: Diagnosis present

## 2011-10-29 DIAGNOSIS — N183 Chronic kidney disease, stage 3 unspecified: Secondary | ICD-10-CM | POA: Diagnosis present

## 2011-10-29 DIAGNOSIS — I509 Heart failure, unspecified: Secondary | ICD-10-CM | POA: Diagnosis present

## 2011-10-29 DIAGNOSIS — E039 Hypothyroidism, unspecified: Secondary | ICD-10-CM | POA: Diagnosis present

## 2011-10-29 DIAGNOSIS — I2510 Atherosclerotic heart disease of native coronary artery without angina pectoris: Secondary | ICD-10-CM | POA: Diagnosis present

## 2011-10-29 DIAGNOSIS — Z9861 Coronary angioplasty status: Secondary | ICD-10-CM

## 2011-10-29 DIAGNOSIS — K219 Gastro-esophageal reflux disease without esophagitis: Secondary | ICD-10-CM | POA: Diagnosis present

## 2011-10-29 DIAGNOSIS — A4902 Methicillin resistant Staphylococcus aureus infection, unspecified site: Secondary | ICD-10-CM | POA: Diagnosis present

## 2011-10-29 DIAGNOSIS — Z950 Presence of cardiac pacemaker: Secondary | ICD-10-CM | POA: Diagnosis present

## 2011-10-29 DIAGNOSIS — Z87891 Personal history of nicotine dependence: Secondary | ICD-10-CM

## 2011-10-29 DIAGNOSIS — Z7901 Long term (current) use of anticoagulants: Secondary | ICD-10-CM

## 2011-10-29 DIAGNOSIS — I10 Essential (primary) hypertension: Secondary | ICD-10-CM | POA: Diagnosis present

## 2011-10-29 DIAGNOSIS — I5032 Chronic diastolic (congestive) heart failure: Secondary | ICD-10-CM | POA: Diagnosis present

## 2011-10-29 DIAGNOSIS — F329 Major depressive disorder, single episode, unspecified: Secondary | ICD-10-CM | POA: Diagnosis present

## 2011-10-29 DIAGNOSIS — D638 Anemia in other chronic diseases classified elsewhere: Secondary | ICD-10-CM | POA: Diagnosis present

## 2011-10-29 DIAGNOSIS — Z79899 Other long term (current) drug therapy: Secondary | ICD-10-CM

## 2011-10-29 LAB — CBC
Hemoglobin: 12.7 g/dL (ref 12.0–15.0)
MCHC: 32.8 g/dL (ref 30.0–36.0)
RDW: 15.7 % — ABNORMAL HIGH (ref 11.5–15.5)

## 2011-10-29 LAB — DIFFERENTIAL
Basophils Absolute: 0 10*3/uL (ref 0.0–0.1)
Basophils Relative: 1 % (ref 0–1)
Neutro Abs: 4.5 10*3/uL (ref 1.7–7.7)
Neutrophils Relative %: 71 % (ref 43–77)

## 2011-10-29 LAB — BASIC METABOLIC PANEL
Chloride: 100 mEq/L (ref 96–112)
GFR calc Af Amer: 27 mL/min — ABNORMAL LOW (ref >90)
Potassium: 4 mEq/L (ref 3.5–5.1)
Sodium: 137 mEq/L (ref 135–145)

## 2011-10-29 LAB — PROTIME-INR
INR: 1.99 — ABNORMAL HIGH (ref 0.00–1.49)
Prothrombin Time: 22.9 seconds — ABNORMAL HIGH (ref 11.6–15.2)

## 2011-10-29 LAB — APTT: aPTT: 51 seconds — ABNORMAL HIGH (ref 24–37)

## 2011-10-29 MED ORDER — ACETAMINOPHEN 325 MG PO TABS: 650.0000 mg | ORAL_TABLET | ORAL | Status: DC | PRN

## 2011-10-29 MED ORDER — SODIUM CHLORIDE 0.9 % IV SOLN
1000.0000 mL | INTRAVENOUS | Status: DC
  Administered 2011-10-29: 1000 mL via INTRAVENOUS

## 2011-10-29 MED ORDER — SODIUM CHLORIDE 0.9 % IJ SOLN
3.0000 mL | Freq: Two times a day (BID) | INTRAMUSCULAR | Status: DC
  Administered 2011-10-29 – 2011-11-01 (×3): 3 mL via INTRAVENOUS

## 2011-10-29 MED ORDER — CHLORDIAZEPOXIDE-AMITRIPTYLINE 5-12.5 MG PO TABS: 1.0000 | ORAL_TABLET | Freq: Every day | ORAL | Status: DC

## 2011-10-29 MED ORDER — PANTOPRAZOLE SODIUM 40 MG PO TBEC
40.0000 mg | DELAYED_RELEASE_TABLET | Freq: Every day | ORAL | Status: DC
  Administered 2011-10-30 – 2011-11-01 (×3): 40 mg via ORAL
  Filled 2011-10-29 (×3): qty 1

## 2011-10-29 MED ORDER — SODIUM CHLORIDE 0.9 % IV SOLN
INTRAVENOUS | Status: DC
  Administered 2011-10-29 – 2011-10-31 (×3): via INTRAVENOUS

## 2011-10-29 MED ORDER — SIMVASTATIN 5 MG PO TABS
5.0000 mg | ORAL_TABLET | Freq: Every day | ORAL | Status: DC
  Administered 2011-10-30 – 2011-10-31 (×2): 5 mg via ORAL
  Filled 2011-10-29 (×3): qty 1

## 2011-10-29 MED ORDER — MORPHINE SULFATE 4 MG/ML IJ SOLN: 4.0000 mg | INTRAMUSCULAR | Status: DC | PRN

## 2011-10-29 MED ORDER — ALBUTEROL SULFATE (5 MG/ML) 0.5% IN NEBU: 2.5000 mg | INHALATION_SOLUTION | RESPIRATORY_TRACT | Status: DC | PRN

## 2011-10-29 MED ORDER — WARFARIN SODIUM 2 MG PO TABS
2.0000 mg | ORAL_TABLET | Freq: Every day | ORAL | Status: DC
  Filled 2011-10-29: qty 1.5

## 2011-10-29 MED ORDER — AMITRIPTYLINE HCL 25 MG PO TABS
12.5000 mg | ORAL_TABLET | Freq: Every day | ORAL | Status: DC
  Administered 2011-10-30 – 2011-10-31 (×3): 12.5 mg via ORAL
  Filled 2011-10-29 (×4): qty 0.5

## 2011-10-29 MED ORDER — WARFARIN SODIUM 3 MG PO TABS
3.0000 mg | ORAL_TABLET | Freq: Once | ORAL | Status: AC
  Administered 2011-10-29: 3 mg via ORAL
  Filled 2011-10-29: qty 1

## 2011-10-29 MED ORDER — ONDANSETRON HCL 4 MG/2ML IJ SOLN: 4.0000 mg | Freq: Four times a day (QID) | INTRAMUSCULAR | Status: DC | PRN

## 2011-10-29 MED ORDER — ACETAMINOPHEN 325 MG PO TABS
650.0000 mg | ORAL_TABLET | Freq: Four times a day (QID) | ORAL | Status: DC | PRN
  Administered 2011-10-31: 650 mg via ORAL
  Filled 2011-10-29: qty 2

## 2011-10-29 MED ORDER — LIDOCAINE-EPINEPHRINE 2 %-1:100000 IJ SOLN
1.7000 mL | Freq: Once | INTRAMUSCULAR | Status: AC
  Administered 2011-10-29: 1.7 mL via INTRADERMAL

## 2011-10-29 MED ORDER — MAGNESIUM OXIDE 400 MG PO TABS
400.0000 mg | ORAL_TABLET | Freq: Every day | ORAL | Status: DC
  Administered 2011-10-29 – 2011-11-01 (×4): 400 mg via ORAL
  Filled 2011-10-29 (×4): qty 1

## 2011-10-29 MED ORDER — SODIUM CHLORIDE 0.9 % IV SOLN
1000.0000 mL | Freq: Once | INTRAVENOUS | Status: AC
Start: 2011-10-29 — End: 2011-10-29
  Administered 2011-10-29: 1000 mL via INTRAVENOUS

## 2011-10-29 MED ORDER — ONDANSETRON HCL 4 MG PO TABS: 4.0000 mg | ORAL_TABLET | Freq: Four times a day (QID) | ORAL | Status: DC | PRN

## 2011-10-29 MED ORDER — AMLODIPINE BESYLATE 5 MG PO TABS
5.0000 mg | ORAL_TABLET | Freq: Every day | ORAL | Status: DC
  Administered 2011-10-30 – 2011-11-01 (×3): 5 mg via ORAL
  Filled 2011-10-29 (×4): qty 1

## 2011-10-29 MED ORDER — CHLORDIAZEPOXIDE HCL 5 MG PO CAPS
5.0000 mg | ORAL_CAPSULE | Freq: Every day | ORAL | Status: DC
  Administered 2011-10-29 – 2011-11-01 (×4): 5 mg via ORAL
  Filled 2011-10-29 (×4): qty 1

## 2011-10-29 MED ORDER — SENNA 8.6 MG PO TABS
1.0000 | ORAL_TABLET | Freq: Two times a day (BID) | ORAL | Status: DC
  Administered 2011-10-30 – 2011-10-31 (×3): 8.6 mg via ORAL
  Filled 2011-10-29 (×7): qty 1

## 2011-10-29 MED ORDER — OXYCODONE-ACETAMINOPHEN 5-325 MG PO TABS
1.0000 | ORAL_TABLET | ORAL | Status: DC | PRN
  Administered 2011-10-30: 1 via ORAL
  Administered 2011-10-30: 0.5 via ORAL
  Filled 2011-10-29 (×2): qty 1

## 2011-10-29 MED ORDER — ZOLPIDEM TARTRATE 5 MG PO TABS: 5.0000 mg | ORAL_TABLET | Freq: Every evening | ORAL | Status: DC | PRN

## 2011-10-29 MED ORDER — ASPIRIN EC 81 MG PO TBEC
81.0000 mg | DELAYED_RELEASE_TABLET | Freq: Every day | ORAL | Status: DC
  Administered 2011-10-30 – 2011-11-01 (×3): 81 mg via ORAL
  Filled 2011-10-29 (×4): qty 1

## 2011-10-29 MED ORDER — MORPHINE SULFATE 2 MG/ML IJ SOLN: 1.0000 mg | INTRAMUSCULAR | Status: DC | PRN

## 2011-10-29 MED ORDER — CLINDAMYCIN PHOSPHATE 600 MG/50ML IV SOLN
600.0000 mg | Freq: Three times a day (TID) | INTRAVENOUS | Status: DC
  Administered 2011-10-29 – 2011-10-30 (×4): 600 mg via INTRAVENOUS
  Filled 2011-10-29 (×7): qty 50

## 2011-10-29 MED ORDER — FERROUS GLUCONATE 324 (38 FE) MG PO TABS
325.0000 mg | ORAL_TABLET | Freq: Every day | ORAL | Status: DC
  Filled 2011-10-29: qty 1

## 2011-10-29 MED ORDER — FERROUS GLUCONATE 324 (38 FE) MG PO TABS
324.0000 mg | ORAL_TABLET | Freq: Every day | ORAL | Status: DC
  Administered 2011-10-30 – 2011-11-01 (×3): 324 mg via ORAL
  Filled 2011-10-29 (×4): qty 1

## 2011-10-29 MED ORDER — METOPROLOL TARTRATE 100 MG PO TABS
100.0000 mg | ORAL_TABLET | Freq: Two times a day (BID) | ORAL | Status: DC
  Administered 2011-10-29 – 2011-11-01 (×6): 100 mg via ORAL
  Filled 2011-10-29 (×7): qty 1

## 2011-10-29 MED ORDER — SUCRALFATE 1 G PO TABS
1.0000 g | ORAL_TABLET | Freq: Three times a day (TID) | ORAL | Status: DC
  Administered 2011-10-29 – 2011-11-01 (×8): 1 g via ORAL
  Filled 2011-10-29 (×10): qty 1

## 2011-10-29 MED ORDER — ACETAMINOPHEN 650 MG RE SUPP: 650.0000 mg | Freq: Four times a day (QID) | RECTAL | Status: DC | PRN

## 2011-10-29 MED ORDER — LOPERAMIDE HCL 2 MG PO CAPS
2.0000 mg | ORAL_CAPSULE | Freq: Every day | ORAL | Status: DC | PRN
  Filled 2011-10-29: qty 1

## 2011-10-29 MED ORDER — CLINDAMYCIN PHOSPHATE 600 MG/50ML IV SOLN
600.0000 mg | Freq: Three times a day (TID) | INTRAVENOUS | Status: DC
  Administered 2011-10-29: 600 mg via INTRAVENOUS
  Filled 2011-10-29: qty 50

## 2011-10-29 MED ORDER — ASPIRIN 81 MG PO TABS: 81.0000 mg | ORAL_TABLET | Freq: Every day | ORAL | Status: DC

## 2011-10-29 MED ORDER — ZOLPIDEM TARTRATE 5 MG PO TABS
5.0000 mg | ORAL_TABLET | Freq: Every evening | ORAL | Status: DC | PRN
  Administered 2011-10-30: 5 mg via ORAL
  Filled 2011-10-29: qty 1

## 2011-10-29 MED ORDER — FUROSEMIDE 40 MG PO TABS
40.0000 mg | ORAL_TABLET | Freq: Every day | ORAL | Status: DC
  Administered 2011-10-30 – 2011-11-01 (×3): 40 mg via ORAL
  Filled 2011-10-29 (×4): qty 1

## 2011-10-29 MED ORDER — POTASSIUM CHLORIDE CRYS ER 10 MEQ PO TBCR
10.0000 meq | EXTENDED_RELEASE_TABLET | Freq: Every day | ORAL | Status: DC
  Administered 2011-10-29 – 2011-11-01 (×4): 10 meq via ORAL
  Filled 2011-10-29 (×4): qty 1

## 2011-10-29 NOTE — Progress Notes (Signed)
ANTICOAGULATION/ANTIBIOTIC CONSULT NOTE - Initial Consult  Pharmacy Consult for Coumadin/Clindamycin Indication: h/o afib and abdominal wall cellulitis/abscess  Allergies  Allergen Reactions  . Amiodarone     Patient Measurements: Weight: 200 lb 9.9 oz (91 kg) (b scale)  Vital Signs: Temp: 97.7 F (36.5 C) (03/16 1937) Temp src: Oral (03/16 1446) BP: 164/84 mmHg (03/16 1937) Pulse Rate: 60  (03/16 1937)  Labs:  [facility] 10/29/11 1543  HGB 12.7  HCT 38.7  PLT 221  APTT 51*  LABPROT 22.9*  INR 1.99*  HEPARINUNFRC --  CREATININE 2.01*  CKTOTAL --  CKMB --  TROPONINI --   The CrCl is unknown because both a height and weight (above a minimum accepted value) are required for this calculation.  Medical History: Past Medical History  Diagnosis Date  . Cardiac pacemaker in situ   . Other specified cardiac dysrhythmias     bradycardia  . Anemia, unspecified   . Cellulitis and abscess of unspecified site   . CAD (coronary artery disease)   . Edema   . Hypothyroid   . Renal insufficiency   . Hypertension   . Gastrointestinal hemorrhage   . GERD (gastroesophageal reflux disease)   . Depression   . CHF (congestive heart failure)   . A-fib   . Warfarin anticoagulation   . SSS (sick sinus syndrome)   . Anemia   . GI bleed     due to ulcers  . H/O: hysterectomy     Medications:  Prescriptions prior to admission  Medication Sig Dispense Refill  . amitriptyline-chlordiazePOXIDE (LIMBITROL) 5-12.5 MG per tablet Take 1 tablet by mouth daily.        Marland Kitchen amLODipine (NORVASC) 5 MG tablet Take 5 mg by mouth daily.        Marland Kitchen aspirin 81 MG tablet Take 81 mg by mouth daily.        . ferrous gluconate (FERGON) 325 MG tablet Take 325 mg by mouth daily with breakfast.        . furosemide (LASIX) 80 MG tablet Take 40 mg by mouth daily.       Marland Kitchen loperamide (IMODIUM) 2 MG capsule Take 2 mg by mouth daily as needed. For diarrhea      . lovastatin (MEVACOR) 10 MG tablet Take 10 mg by  mouth at bedtime.        . magnesium oxide (MAG-OX) 400 MG tablet Take 400 mg by mouth daily.       . metoprolol (LOPRESSOR) 50 MG tablet Take 100 mg by mouth 2 (two) times daily.       Marland Kitchen oxyCODONE-acetaminophen (PERCOCET) 5-325 MG per tablet Take 1 tablet by mouth every 4 (four) hours as needed. For pain      . pantoprazole (PROTONIX) 40 MG tablet Take 40 mg by mouth daily.        . potassium chloride (KLOR-CON) 10 MEQ CR tablet Take 10 mEq by mouth 2 (two) times daily.       . sucralfate (CARAFATE) 1 G tablet Take 1 g by mouth 3 (three) times daily.        Marland Kitchen warfarin (COUMADIN) 2 MG tablet Take 2-2.5 mg by mouth daily. Pt takes 1 & 1/2 tabs on m, Friday, and 1 tab all other days        Assessment: 77 y/o female patient on chronic coumadin admitted with abdominal wall cellulitis/abscess requiring antiobiotics. INR slightly subtherapeutic, has not taken dose today. Admit with elevated scr but no need to  renal adjust clindamycin.   Goal: INR2-3  Plan:  Coumadin 3mg  today and continue clindamycin 600mg  IV q8h.  Verlene Mayer, PharmD, BCPS Pager [phone] 10/29/2011,7:48 PM

## 2011-10-29 NOTE — ED Provider Notes (Signed)
History     CSN: [id number]  Arrival date & time 10/29/11  1440   First MD Initiated Contact with Patient 10/29/11 1524      Chief Complaint  Patient presents with  . Abscess    (Consider location/radiation/quality/duration/timing/severity/associated sxs/prior treatment) HPI Comments: Patient with a history of A. fib, CHF, hypertension, renal insufficiency, CAD and cellulitis presents emergency department with chief complaint of suprapubic abscess.  Patient denies any fevers, night sweats, chills.  Patient did not have a history of diabetes, steroid use, cancer or, or any other immunosuppressive disease.  Patient states that her symptoms began Wednesday and that she noticed purulent drainage from the abscess yesterday.  Patient skin erythema has been gradually spreading.  Patient has no other complaints at this time.  Patient is a 77 y.o. female presenting with abscess.  Abscess  Pertinent negatives include no fever.    Past Medical History  Diagnosis Date  . Cardiac pacemaker in situ   . Other specified cardiac dysrhythmias     bradycardia  . Anemia, unspecified   . Cellulitis and abscess of unspecified site   . CAD (coronary artery disease)   . Edema   . Hypothyroid   . Renal insufficiency   . Hypertension   . Gastrointestinal hemorrhage   . GERD (gastroesophageal reflux disease)   . Depression   . CHF (congestive heart failure)   . A-fib   . Warfarin anticoagulation   . SSS (sick sinus syndrome)   . Anemia   . GI bleed     due to ulcers  . H/O: hysterectomy     Past Surgical History  Procedure Date  . Pacemaker insertion   . Esophagogastroduodenoscopy   . Coronary angioplasty     percutanous transluminal   . Cholecystectomy   . Appendectomy   . Cardiac cath 2006, 2008, 2009, 01/13/2011  . Lumbar disc surgery     Family History  Problem Relation Age of Onset  . Colon cancer Neg Hx   . Thyroid disease Neg Hx   . Stroke Brother   . Kidney disease Brother    . Other Mother     Sepsis and perforated colon  . Other Father     Motor vehicle accident    History  Substance Use Topics  . Smoking status: Former Games developer  . Smokeless tobacco: Not on file  . Alcohol Use: No    OB History    Grav Para Term Preterm Abortions TAB SAB Ect Mult Living                  Review of Systems  Constitutional: Negative for fever, chills, diaphoresis and activity change.       Denies night sweats  HENT: Negative for neck pain and neck stiffness.   Eyes: Negative for visual disturbance.  Respiratory: Negative for shortness of breath.   Cardiovascular: Negative for chest pain.  Gastrointestinal: Negative for abdominal pain.  Genitourinary: Negative for dysuria, urgency and frequency.  Musculoskeletal: Negative for gait problem.  Skin: Positive for color change. Negative for rash.       abscess  Neurological: Negative for dizziness, light-headedness and headaches.  Hematological: Negative for adenopathy.  All other systems reviewed and are negative.    Allergies  Amiodarone  Home Medications   Current Outpatient Rx  Name Route Sig Dispense Refill  . CHLORDIAZEPOXIDE-AMITRIPTYLINE 5-12.5 MG PO TABS Oral Take 1 tablet by mouth daily.      Marland Kitchen AMLODIPINE BESYLATE 5 MG PO  TABS Oral Take 5 mg by mouth daily.      . ASPIRIN 81 MG PO TABS Oral Take 81 mg by mouth daily.      Marland Kitchen FERROUS GLUCONATE 325 MG PO TABS Oral Take 325 mg by mouth daily with breakfast.      . FUROSEMIDE 80 MG PO TABS Oral Take 40 mg by mouth daily.     Marland Kitchen LOPERAMIDE HCL 2 MG PO CAPS Oral Take 2 mg by mouth daily as needed. For diarrhea    . LOVASTATIN 10 MG PO TABS Oral Take 10 mg by mouth at bedtime.      Marland Kitchen MAGNESIUM OXIDE 400 MG PO TABS Oral Take 400 mg by mouth daily.     Marland Kitchen METOPROLOL TARTRATE 50 MG PO TABS Oral Take 100 mg by mouth 2 (two) times daily.     . OXYCODONE-ACETAMINOPHEN 5-325 MG PO TABS Oral Take 1 tablet by mouth every 4 (four) hours as needed. For pain    .  PANTOPRAZOLE SODIUM 40 MG PO TBEC Oral Take 40 mg by mouth daily.      Marland Kitchen POTASSIUM CHLORIDE 10 MEQ PO TBCR Oral Take 10 mEq by mouth 2 (two) times daily.     . SUCRALFATE 1 G PO TABS Oral Take 1 g by mouth 3 (three) times daily.      . WARFARIN SODIUM 2 MG PO TABS Oral Take 2-2.5 mg by mouth daily. Pt takes 1 & 1/2 tabs on m, Friday, and 1 tab all other days      BP 156/79  Pulse 66  Temp(Src) 97.6 F (36.4 C) (Oral)  Resp 18  SpO2 96%  Physical Exam  Nursing note and vitals reviewed. Constitutional: She is oriented to person, place, and time. She appears well-developed and well-nourished. She does not have a sickly appearance. She does not appear ill. No distress.  HENT:  Head: Normocephalic and atraumatic.  Eyes: Conjunctivae and EOM are normal.  Neck: Normal range of motion. Neck supple.  Cardiovascular: Normal rate and regular rhythm.   Pulmonary/Chest: Effort normal and breath sounds normal.  Musculoskeletal: She exhibits no edema.  Lymphadenopathy:       Head (right side): No submental, no preauricular and no posterior auricular adenopathy present.       Head (left side): No submental, no submandibular, no preauricular and no posterior auricular adenopathy present.    She has no axillary adenopathy.  Neurological: She is alert and oriented to person, place, and time.  Skin: Skin is warm and dry. No rash noted. She is not diaphoretic.       2cm sized abscess located midline in suprapubic region. Extreme tenderness to palpation. Not currently draining. Abscess is fluctuant with notable warmth, surrounding erythema, & skin dimpling.     ED Course  Procedures (including critical care time)  Labs Reviewed  CBC - Abnormal; Notable for the following:    RDW 15.7 (*)    All other components within normal limits  BASIC METABOLIC PANEL - Abnormal; Notable for the following:    BUN 24 (*)    Creatinine, Ser 2.01 (*)    GFR calc non Af Amer 23 (*)    GFR calc Af Amer 27 (*)     All other components within normal limits  PROTIME-INR - Abnormal; Notable for the following:    Prothrombin Time 22.9 (*)    INR 1.99 (*)    All other components within normal limits  APTT - Abnormal; Notable for  the following:    aPTT 51 (*)    All other components within normal limits  DIFFERENTIAL  CULTURE, BLOOD (ROUTINE X 2)  CULTURE, BLOOD (ROUTINE X 2)   No results found.   No diagnosis found. INCISION AND DRAINAGE Performed by: Jaci Carrel Consent: Verbal consent obtained. Risks and benefits: risks, benefits and alternatives were discussed Type: abscess  Body area: suprapubic region, midline  Anesthesia: local infiltration  Local anesthetic: lidocaine 2% w epinephrine  Anesthetic total: 1.5 ml  Complexity: complex Blunt dissection to break up loculations  Drainage: purulent  Drainage amount: mild  Packing material: 1/4 in iodoform gauze  Patient tolerance: Patient tolerated the procedure well with no immediate complications.      MDM  Abscess complicated by cellulitis  Patient has been given 600IV clindamycin while in the emergency department and has tolerated I&D well.  Patient to be admitted for antibiotic therapy and observation. The patient appears reasonably stabilized for admission considering the current resources, flow, and capabilities available in the ED at this time, and I doubt any other [facility] requiring further screening and/or treatment in the ED prior to admission.  Admit [facility] 3, telemetry, Cellulitis   Medical screening examination/treatment/procedure(s) were conducted as a shared visit with non-physician practitioner(s) and myself.  I personally evaluated the patient during the encounter 77 yo woman with cellulitis of the suprapubic region.  She is not diabetic.  Exam shows an abscess with surrounding cellulitis on the suprapubic region.  The is no crepitance or subcutaneous emphysema.  Ms. Drue Novel incised and drained the abscess,  started pt on clindamycin.  Recommended admission for continued antibiotic treatment. Osvaldo Human, M.D.       Jaci Carrel, PA-C 10/29/11 1741  Carleene Cooper III, MD 10/30/11 0730

## 2011-10-29 NOTE — H&P (Signed)
PATIENT DETAILS Name: [name] Age: 77 y.o. Sex: female Date of Birth: [birth year] Admit Date: 10/29/2011 QIO:[email or web address],WUXLK, MD, MD   CHIEF COMPLAINT:  Lower abdominal erythema and swelling for the past 2 days  HPI: Patient is a 77 year old Caucasian female with a past medical history of atrial fibrillation on chronic Coumadin therapy, history of bradycardia status post permanent pacemaker implantation, hypertension, history of coronary artery disease status post PTCA presented to the hospital for the above-noted complaints. Family for the past 3 days patient has been having swelling and erythema in the anterior abdominal wall and a suprapubic area. She denies any fever but complained that the swelling did worsen over the past 1 or 2 days and hence she came to the ED. She was found to have a small abscess in the suprapubic area, she  underwent incision and drainage here in the emergency room, however she has a large cellulitic surrounding area and has now been transferred to the hospitalist service for admission for intravenous antibiotics. Patient currently denies any other complaints like chest pain or shortness of breath. She denies any headache. She denies any nausea, vomiting or diarrhea. She denies any leg swelling as well.   ALLERGIES:   Allergies  Allergen Reactions  . Amiodarone     PAST MEDICAL HISTORY: Past Medical History  Diagnosis Date  . Cardiac pacemaker in situ   . Other specified cardiac dysrhythmias     bradycardia  . Anemia, unspecified   . Cellulitis and abscess of unspecified site   . CAD (coronary artery disease)   . Edema   . Hypothyroid   . Renal insufficiency   . Hypertension   . Gastrointestinal hemorrhage   . GERD (gastroesophageal reflux disease)   . Depression   . CHF (congestive heart failure)   . A-fib   . Warfarin anticoagulation   . SSS (sick sinus syndrome)   . Anemia   . GI bleed     due to ulcers  . H/O: hysterectomy     PAST  SURGICAL HISTORY: Past Surgical History  Procedure Date  . Pacemaker insertion   . Esophagogastroduodenoscopy   . Coronary angioplasty     percutanous transluminal   . Cholecystectomy   . Appendectomy   . Cardiac cath 2006, 2008, 2009, 01/13/2011  . Lumbar disc surgery     MEDICATIONS AT HOME: Prior to Admission medications   Medication Sig Start Date End Date Taking? Authorizing Provider  amitriptyline-chlordiazePOXIDE (LIMBITROL) 5-12.5 MG per tablet Take 1 tablet by mouth daily.     Yes Historical Provider, MD  amLODipine (NORVASC) 5 MG tablet Take 5 mg by mouth daily.     Yes Historical Provider, MD  aspirin 81 MG tablet Take 81 mg by mouth daily.     Yes Historical Provider, MD  ferrous gluconate (FERGON) 325 MG tablet Take 325 mg by mouth daily with breakfast.     Yes Historical Provider, MD  furosemide (LASIX) 80 MG tablet Take 40 mg by mouth daily.    Yes Historical Provider, MD  loperamide (IMODIUM) 2 MG capsule Take 2 mg by mouth daily as needed. For diarrhea   Yes Historical Provider, MD  lovastatin (MEVACOR) 10 MG tablet Take 10 mg by mouth at bedtime.     Yes Historical Provider, MD  magnesium oxide (MAG-OX) 400 MG tablet Take 400 mg by mouth daily.    Yes Historical Provider, MD  metoprolol (LOPRESSOR) 50 MG tablet Take 100 mg by mouth 2 (two) times daily.  Yes Historical Provider, MD  oxyCODONE-acetaminophen (PERCOCET) 5-325 MG per tablet Take 1 tablet by mouth every 4 (four) hours as needed. For pain   Yes Historical Provider, MD  pantoprazole (PROTONIX) 40 MG tablet Take 40 mg by mouth daily.     Yes Historical Provider, MD  potassium chloride (KLOR-CON) 10 MEQ CR tablet Take 10 mEq by mouth 2 (two) times daily.    Yes Historical Provider, MD  sucralfate (CARAFATE) 1 G tablet Take 1 g by mouth 3 (three) times daily.     Yes Historical Provider, MD  warfarin (COUMADIN) 2 MG tablet Take 2-2.5 mg by mouth daily. Pt takes 1 & 1/2 tabs on m, Friday, and 1 tab all other days    Yes Historical Provider, MD    FAMILY HISTORY: Family History  Problem Relation Age of Onset  . Colon cancer Neg Hx   . Thyroid disease Neg Hx   . Stroke Brother   . Kidney disease Brother   . Other Mother     Sepsis and perforated colon  . Other Father     Motor vehicle accident    SOCIAL HISTORY:  reports that she has quit smoking. She does not have any smokeless tobacco history on file. She reports that she does not drink alcohol or use illicit drugs.  REVIEW OF SYSTEMS:  Constitutional:   No  weight loss, night sweats,  Fevers, chills, fatigue.  HEENT:    No headaches, Difficulty swallowing,Tooth/dental problems,Sore throat,  No sneezing, itching, ear ache, nasal congestion, post nasal drip,   Cardio-vascular: No chest pain,  Orthopnea, PND, swelling in lower extremities, anasarca, dizziness, palpitations  GI:  No heartburn, indigestion, abdominal pain, nausea, vomiting, diarrhea, change in bowel habits, loss of appetite  Resp: No shortness of breath with exertion or at rest.  No excess mucus, no productive cough, No non-productive cough,  No coughing up of blood.No change in color of mucus.No wheezing.No chest wall deformity  Skin:  no rash or lesions.  GU:  no dysuria, change in color of urine, no urgency or frequency.  No flank pain.  Musculoskeletal: No joint pain or swelling.  No decreased range of motion.  No back pain.  Psych: No change in mood or affect. No depression or anxiety.  No memory loss.   PHYSICAL EXAM: Blood pressure 156/79, pulse 66, temperature 97.6 F (36.4 C), temperature source Oral, resp. rate 18, SpO2 96.00%.  General appearance :Awake, alert, not in any distress. Speech Clear. Not toxic Looking HEENT: Atraumatic and Normocephalic, pupils equally reactive to light and accomodation Neck: supple, no JVD. No cervical lymphadenopathy.  Chest:Good air entry bilaterally, no added sounds  CVS: S1 S2 regular, no murmurs.  Abdomen: Bowel  sounds present, Non tender and not distended with no gaurding, rigidity or rebound. Approximately 10 cm of cellulitic area in the anterior abdominal wall along the midline in the suprapubic region, she is status post I&D and packing. The cellulitic area does not involve the perineal area of the vulva area. Extremities: B/L Lower Ext shows no edema, both legs are warm to touch, with  dorsalis pedis pulses palpable. Chronic venous stasis changes noted. Neurology: Awake alert, and oriented X 3, CN II-XII intact, Non focal,  Skin:No Rash Wounds:N/A  LABS ON ADMISSION:   Basename 10/29/11 1543  NA 137  K 4.0  CL 100  CO2 24  GLUCOSE 87  BUN 24*  CREATININE 2.01*  CALCIUM 9.2  MG --  PHOS --   No  results found for this basename: AST:2,ALT:2,ALKPHOS:2,BILITOT:2,PROT:2,ALBUMIN:2 in the last 72 hours No results found for this basename: LIPASE:2,AMYLASE:2 in the last 72 hours  Basename 10/29/11 1543  WBC 6.3  NEUTROABS 4.5  HGB 12.7  HCT 38.7  MCV 87.4  PLT 221   No results found for this basename: CKTOTAL:3,CKMB:3,CKMBINDEX:3,TROPONINI:3 in the last 72 hours No results found for this basename: DDIMER:2 in the last 72 hours No components found with this basename: POCBNP:3   RADIOLOGIC STUDIES ON ADMISSION: Dg Esophagus  10/26/2011  *RADIOLOGY REPORT*  Clinical Data: Dysphasia, reflux  ESOPHOGRAM/BARIUM SWALLOW  Technique:  Combined double contrast and single contrast examination performed using effervescent crystals, thick barium liquid, and thin barium liquid.  Fluoroscopy time:  1.26 minutes.  Comparison:  None.  Findings:  Normal oral phase of swallowing.  No laryngeal penetration or tracheobronchial aspiration.  Mildly diminished primary esophageal peristalsis.  No fixed esophageal narrowing or stricture.  A 13 mm barium tablet passed into the stomach without delay.  Small hiatal hernia.  The patient was unable to completely clear her esophagus in the prone position.  However, at  least mild gastroesophageal reflux was visualized.  IMPRESSION: Mildly diminished primary esophageal peristalsis.  No fixed esophageal narrowing or stricture.  Small hiatal hernia.  Mild gastroesophageal reflux.  Original Report Authenticated By: Charline Bills, M.D.    ASSESSMENT AND PLAN: Present on Admission:  .Cellulitis and abscess of abdominal wall -Status post incision and drainage her in the emergency room, per RN cultures have been sent  -She has been started on intravenous clindamycin here in the emergency room, I will continue this for now.  -I have asked the RN to mark the cellulitic area so that we can gauge clinical progress  -Patient is afebrile, stable vital signs and does not have a toxic appearance -Will need to follow culture sensitivities. -We'll admit to telemetry unit for 24 hours, this can be discontinued if no major events on telemetry in the next 24 hours.  Marland KitchenRENAL INSUFFICIENCY-chronic kidney disease stage III-IV -Creatinine currently close to usual baseline  -Monitor electrolytes closely   .HYPERTENSION -Currently controlled here in the emergency room, we'll continue amlodipine and metoprolol.   Marland KitchenGERD -Continue Protonix and sucralfate.  -Currently no complaints of abdominal pain.   Marland Kitchen History of BRADYCARDIA -Permanent pacemaker in place   .CAD, UNSPECIFIED SITE -Status post PTCA  -Continue with aspirin  -Currently chest pain-free   .Atrial fibrillation -On chronic Coumadin therapy, currently rate controlled  -Will check 12-lead EKG  -Coumadin will be managed by pharmacy   .Congestive heart failure, unspecified -Likely diastolic heart failure, clinically compensated  .ANEMIA -Hemoglobin stable at 12.7, we'll continue with iron supplementation  Further plan will depend as patient's clinical course evolves and further radiologic and laboratory data become available. Patient will be monitored closely.  DVT Prophylaxis: -Not needed as on chronic  Coumadin therapy  Code Status: Full code  Total time spent for admission equals 45 minutes.  Jeoffrey Massed 10/29/2011, 6:11 PM

## 2011-10-29 NOTE — ED Provider Notes (Signed)
Date: 10/29/2011  Rate: 60  Rhythm: Atrial flutter, ventricular pacing.   QRS Axis: left  Intervals: normal  ST/T Wave abnormalities: normal  Conduction Disutrbances:none  Narrative Interpretation: Abnormal EKG.  Old EKG Reviewed: unchanged    Carleene Cooper III, MD 10/29/11 [phone]

## 2011-10-29 NOTE — ED Notes (Signed)
Here for knot on lower abd and purulent drainage from navel, sts that hx of umbilical hernia. wednesday

## 2011-10-30 LAB — BASIC METABOLIC PANEL
CO2: 26 mEq/L (ref 19–32)
Chloride: 104 mEq/L (ref 96–112)
Creatinine, Ser: 2.03 mg/dL — ABNORMAL HIGH (ref 0.50–1.10)
Sodium: 139 mEq/L (ref 135–145)

## 2011-10-30 LAB — CBC
Hemoglobin: 10.6 g/dL — ABNORMAL LOW (ref 12.0–15.0)
MCV: 85.6 fL (ref 78.0–100.0)
Platelets: 167 10*3/uL (ref 150–400)
RBC: 3.76 MIL/uL — ABNORMAL LOW (ref 3.87–5.11)
WBC: 5.2 10*3/uL (ref 4.0–10.5)

## 2011-10-30 MED ORDER — WARFARIN SODIUM 2 MG PO TABS
2.0000 mg | ORAL_TABLET | Freq: Once | ORAL | Status: AC
  Administered 2011-10-30: 2 mg via ORAL
  Filled 2011-10-30 (×2): qty 1

## 2011-10-30 MED ORDER — WARFARIN - PHARMACIST DOSING INPATIENT: Freq: Every day | Status: DC

## 2011-10-30 NOTE — Progress Notes (Signed)
ANTICOAGULATION/ANTIBIOTIC CONSULT NOTE - Follow Up Consult  Pharmacy Consult for Coumadin/Clindamycin Indication: h/o afib and abdominal wall cellulitis/abscess  Assessment: 77 yo female on coumadin PTA for afib, who was admitted with abdominal wall cellulitis/abscess requiring antiobiotics. INR today is therapeutic at 2.46, no bleeding issues reported.    Home coumadin dose: 2mg  daily except 3mg  on Mon & Fri  Goals of Therapy: INR 2.0-3.0  Plan:  -Coumadin 2mg  PO x1 today. Will try to keep on home dose -Check INR in AM  Bayard Beaver. Saul Fordyce, PharmD Pager: [phone]  10/30/2011 1:15 PM    Allergies  Allergen Reactions  . Amiodarone     Patient Measurements: Height: 5\' 7"  (170.2 cm) Weight: 199 lb 8.3 oz (90.5 kg) IBW/kg (Calculated) : 61.6   Vital Signs: Temp: 98.2 F (36.8 C) (03/17 0900) Temp src: Oral (03/17 0900) BP: 143/69 mmHg (03/17 1048) Pulse Rate: 60  (03/17 1048)  Labs:  [facility] 10/30/11 0455 10/29/11 1543  HGB 10.6* 12.7  HCT 32.2* 38.7  PLT 167 221  APTT -- 51*  LABPROT 27.1* 22.9*  INR 2.46* 1.99*  HEPARINUNFRC -- --  CREATININE 2.03* 2.01*  CKTOTAL -- --  CKMB -- --  TROPONINI -- --   Estimated Creatinine Clearance: 28.1 ml/min (by C-G formula based on Cr of 2.03).  Medical History: Past Medical History  Diagnosis Date  . Cardiac pacemaker in situ   . Other specified cardiac dysrhythmias     bradycardia  . Anemia, unspecified   . Cellulitis and abscess of unspecified site   . CAD (coronary artery disease)   . Edema   . Hypothyroid   . Renal insufficiency   . Hypertension   . Gastrointestinal hemorrhage   . GERD (gastroesophageal reflux disease)   . Depression   . CHF (congestive heart failure)   . A-fib   . Warfarin anticoagulation   . SSS (sick sinus syndrome)   . Anemia   . GI bleed     due to ulcers  . H/O: hysterectomy     Medications:  Prescriptions prior to admission  Medication Sig Dispense Refill  .  amitriptyline-chlordiazePOXIDE (LIMBITROL) 12.5-5 MG per tablet Take 1 tablet by mouth daily.      Marland Kitchen amLODipine (NORVASC) 5 MG tablet Take 5 mg by mouth daily.        Marland Kitchen aspirin 81 MG tablet Take 81 mg by mouth daily.        . ferrous gluconate (FERGON) 325 MG tablet Take 325 mg by mouth daily with breakfast.       . furosemide (LASIX) 80 MG tablet Take 40 mg by mouth daily.       Marland Kitchen loperamide (IMODIUM) 2 MG capsule Take 2 mg by mouth daily as needed. For diarrhea      . lovastatin (MEVACOR) 10 MG tablet Take 10 mg by mouth at bedtime.        . magnesium oxide (MAG-OX) 400 MG tablet Take 400 mg by mouth daily.       . metoprolol (LOPRESSOR) 50 MG tablet Take 100 mg by mouth 2 (two) times daily.       Marland Kitchen oxyCODONE-acetaminophen (PERCOCET) 5-325 MG per tablet Take 1 tablet by mouth every 4 (four) hours as needed. For pain      . pantoprazole (PROTONIX) 40 MG tablet Take 40 mg by mouth daily.        . potassium chloride (KLOR-CON) 10 MEQ CR tablet Take 10 mEq by mouth 2 (two) times daily.       Marland Kitchen  sucralfate (CARAFATE) 1 G tablet Take 1 g by mouth 3 (three) times daily.        Marland Kitchen warfarin (COUMADIN) 2 MG tablet Take 2-2.5 mg by mouth daily. Pt takes 1 & 1/2 tabs on m, Friday, and 1 tab all other days      . DISCONTD: amitriptyline-chlordiazePOXIDE (LIMBITROL) 5-12.5 MG per tablet Take 1 tablet by mouth daily.

## 2011-10-30 NOTE — Progress Notes (Signed)
Attempted to call and give report RN at lunch. Will attempt again.[name]

## 2011-10-30 NOTE — Progress Notes (Signed)
Subjective: Doing better, suprapubic area not as tender  Objective: Vital signs in last 24 hours: Temp:  [97.6 F (36.4 C)-98.6 F (37 C)] 97.6 F (36.4 C) (03/17 0635) Pulse Rate:  [60-66] 60  (03/17 0900) Resp:  [16-19] 16  (03/17 0900) BP: (124-164)/(66-84) 124/79 mmHg (03/17 0900) SpO2:  [96 %-99 %] 99 % (03/17 0635) Weight:  [91 kg (200 lb 9.9 oz)] 91 kg (200 lb 9.9 oz) (03/16 1937) Weight change:  Last BM Date: 10/29/11  Intake/Output from previous day: 03/16 0701 - 03/17 0700 In: 1350 [P.O.:250; I.V.:1000; IV Piggyback:100] Out: 1075 [Urine:1075] Total I/O In: 600 [P.O.:600] Out: -    Physical Exam: General: Alert, awake, oriented x3, in no acute distress. HEENT: No bruits, no goiter. Heart: Regular rate and rhythm, without murmurs, rubs, gallops. Lungs: Clear to auscultation bilaterally. Abdomen: Soft, nontender, nondistended, positive bowel sounds. Large Suprapubic area of erythema and induration, 1cm I&D site with minimal purulence and gauze insitu Extremities: No clubbing cyanosis or edema with positive pedal pulses. Neuro: Grossly intact, nonfocal.    Lab Results: Basic Metabolic Panel:  Basename 10/30/11 0455 10/29/11 1543  NA 139 137  K 3.4* 4.0  CL 104 100  CO2 26 24  GLUCOSE 117* 87  BUN 26* 24*  CREATININE 2.03* 2.01*  CALCIUM 8.6 9.2  MG -- --  PHOS -- --   Liver Function Tests: No results found for this basename: AST:2,ALT:2,ALKPHOS:2,BILITOT:2,PROT:2,ALBUMIN:2 in the last 72 hours No results found for this basename: LIPASE:2,AMYLASE:2 in the last 72 hours No results found for this basename: AMMONIA:2 in the last 72 hours CBC:  Basename 10/30/11 0455 10/29/11 1543  WBC 5.2 6.3  NEUTROABS -- 4.5  HGB 10.6* 12.7  HCT 32.2* 38.7  MCV 85.6 87.4  PLT 167 221   Cardiac Enzymes: No results found for this basename: CKTOTAL:3,CKMB:3,CKMBINDEX:3,TROPONINI:3 in the last 72 hours BNP: No results found for this basename: PROBNP:3 in the last  72 hours D-Dimer: No results found for this basename: DDIMER:2 in the last 72 hours CBG: No results found for this basename: GLUCAP:6 in the last 72 hours Hemoglobin A1C: No results found for this basename: HGBA1C in the last 72 hours Fasting Lipid Panel: No results found for this basename: CHOL,HDL,LDLCALC,TRIG,CHOLHDL,LDLDIRECT in the last 72 hours Thyroid Function Tests: No results found for this basename: TSH,T4TOTAL,FREET4,T3FREE,THYROIDAB in the last 72 hours Anemia Panel: No results found for this basename: VITAMINB12,FOLATE,FERRITIN,TIBC,IRON,RETICCTPCT in the last 72 hours Coagulation:  Basename 10/30/11 0455 10/29/11 1543  LABPROT 27.1* 22.9*  INR 2.46* 1.99*   Urine Drug Screen: Drugs of Abuse  No results found for this basename: labopia, cocainscrnur, labbenz, amphetmu, thcu, labbarb    Alcohol Level: No results found for this basename: ETH:2 in the last 72 hours Urinalysis: No results found for this basename: COLORURINE:2,APPERANCEUR:2,LABSPEC:2,PHURINE:2,GLUCOSEU:2,HGBUR:2,BILIRUBINUR:2,KETONESUR:2,PROTEINUR:2,UROBILINOGEN:2,NITRITE:2,LEUKOCYTESUR:2 in the last 72 hours  Recent Results (from the past 240 hour(s))  WOUND CULTURE     Status: Normal (Preliminary result)   Collection Time   10/29/11  5:35 PM      Component Value Range Status Comment   Specimen Description WOUND ABDOMEN   Final    Special Requests LOWER   Final    Gram Stain     Final    Value: MODERATE WBC PRESENT, PREDOMINANTLY PMN     NO SQUAMOUS EPITHELIAL CELLS SEEN     MODERATE GRAM POSITIVE COCCI IN PAIRS   Culture Culture reincubated for better growth   Final    Report Status PENDING  Incomplete     Studies/Results: No results found.  Medications: Scheduled Meds:   . sodium chloride  1,000 mL Intravenous Once  . amitriptyline  12.5 mg Oral QHS  . amLODipine  5 mg Oral Daily  . aspirin EC  81 mg Oral Daily  . chlordiazePOXIDE  5 mg Oral Daily  . clindamycin (CLEOCIN) IV  600 mg  Intravenous Q8H  . ferrous gluconate  324 mg Oral Q breakfast  . furosemide  40 mg Oral Daily  . lidocaine-EPINEPHrine  1.7 mL Intradermal Once  . magnesium oxide  400 mg Oral Daily  . metoprolol  100 mg Oral BID  . pantoprazole  40 mg Oral Daily  . potassium chloride  10 mEq Oral Daily  . senna  1 tablet Oral BID  . simvastatin  5 mg Oral q1800  . sodium chloride  3 mL Intravenous Q12H  . sucralfate  1 g Oral TID  . warfarin  3 mg Oral Once  . DISCONTD: amitriptyline-chlordiazePOXIDE  1 tablet Oral Daily  . DISCONTD: aspirin  81 mg Oral Daily  . DISCONTD: clindamycin (CLEOCIN) IV  600 mg Intravenous Q8H  . DISCONTD: ferrous gluconate  325 mg Oral Q breakfast  . DISCONTD: warfarin  2-3 mg Oral Daily   Continuous Infusions:   . sodium chloride 40 mL/hr at 10/29/11 2146  . DISCONTD: sodium chloride 1,000 mL (10/29/11 1826)   PRN Meds:.acetaminophen, acetaminophen, albuterol, loperamide, morphine, ondansetron (ZOFRAN) IV, ondansetron, oxyCODONE-acetaminophen, zolpidem, DISCONTD: acetaminophen, DISCONTD: morphine, DISCONTD: ondansetron (ZOFRAN) IV, DISCONTD: zolpidem  Assessment/Plan:  ASSESSMENT AND PLAN:  Present on Admission:  .Cellulitis and abscess of abdominal wall  -Status post incision and drainage in ER FU wound cultures Wound care consult Continue IV Clindamycin  Cut down IVF  .  Marland KitchenRENAL INSUFFICIENCY-chronic kidney disease stage III-IV  stable  .HYPERTENSION  -continue amlodipine and metoprolol.   Marland KitchenGERD  -Continue Protonix and sucralfate.   Marland Kitchen History of BRADYCARDIA  -Permanent pacemaker in place   .CAD, UNSPECIFIED SITE  -Status post PTCA , Continue aspirin   .Atrial fibrillation  -On chronic Coumadin therapy, currently rate controlled  -Coumadin will be managed by pharmacy   .Congestive heart failure, unspecified  -Likely diastolic heart failure, clinically compensated   DVT Prophylaxis:  -Not needed as on chronic Coumadin therapy   Code Status:    Full code  DC tele, ambulate    LOS: 1 day   [facility] Pager: [phone] 10/30/2011, 10:26 AM

## 2011-10-31 LAB — PROTIME-INR
INR: 2.67 — ABNORMAL HIGH (ref 0.00–1.49)
Prothrombin Time: 28.9 seconds — ABNORMAL HIGH (ref 11.6–15.2)

## 2011-10-31 LAB — CBC
Hemoglobin: 10 g/dL — ABNORMAL LOW (ref 12.0–15.0)
RBC: 3.53 MIL/uL — ABNORMAL LOW (ref 3.87–5.11)

## 2011-10-31 LAB — BASIC METABOLIC PANEL
CO2: 27 mEq/L (ref 19–32)
Glucose, Bld: 100 mg/dL — ABNORMAL HIGH (ref 70–99)
Potassium: 3.5 mEq/L (ref 3.5–5.1)
Sodium: 136 mEq/L (ref 135–145)

## 2011-10-31 MED ORDER — VANCOMYCIN HCL 1000 MG IV SOLR
750.0000 mg | INTRAVENOUS | Status: DC
  Administered 2011-10-31 – 2011-11-01 (×2): 750 mg via INTRAVENOUS
  Filled 2011-10-31 (×3): qty 750

## 2011-10-31 MED ORDER — ENSURE CLINICAL ST REVIGOR PO LIQD
237.0000 mL | Freq: Two times a day (BID) | ORAL | Status: DC
  Administered 2011-11-01: 237 mL via ORAL

## 2011-10-31 MED ORDER — WARFARIN SODIUM 3 MG PO TABS
3.0000 mg | ORAL_TABLET | Freq: Once | ORAL | Status: AC
  Administered 2011-10-31: 3 mg via ORAL
  Filled 2011-10-31: qty 1

## 2011-10-31 NOTE — Consult Note (Signed)
WOC consult Note Reason for Consult: s/p I and D of suprapubic abscess. Pt still has considerable induration in this area and erythema extending aprox. 4cm circumferentially.   Wound type: s/p I &D  Measurement: actual opening is only 0.2cm x 0.2cm x 0.5, opening had slightly closed over, I opened again with blunt end of sterile applicator, but was unable to express any drainage with palpation of this area Wound ZOX:WRUEAV to visualize Drainage (amount, consistency, odor) minimal yellow, no odor Periwound: indurated, erythematous  Dressing procedure/placement/frequency: will have bedside nurse pack with iodoform packing once arrives to floor.  I have packed with corner of 2x2 gauze at present to facilitate drainage. Discussed with pt and bedside nursing.  Explained that area may need further opening per surgeon if induration and erythema does not improve with abtx.  Re consult if needed, will not follow at this time. Thanks  Aleeta Schmaltz [employer], CWOCN [phone])

## 2011-10-31 NOTE — Progress Notes (Addendum)
ANTICOAGULATION CONSULT NOTE - Follow Up Consult  Pharmacy Consult for Coumadin Indication: h/o afib  Assessment: 77 yo female on coumadin PTA for afib, who was admitted with abdominal wall cellulitis/abscess requiring antiobiotics. INR today is therapeutic at 2.67, no bleeding issues reported.    Home coumadin dose: 2mg  daily except 3mg  on Mon & Fri  Goals of Therapy: INR 2.0-3.0  Plan:  -Coumadin 3 mg PO x1 today. Will try to keep on home dose -Check INR in AM, if still rising tomorrow, may need to reduce dose some.  Reece Leader, Pharm D 10/31/2011 2:56 PM     Allergies  Allergen Reactions  . Amiodarone     Patient Measurements: Height: 5\' 7"  (170.2 cm) Weight: 199 lb 8.3 oz (90.5 kg) IBW/kg (Calculated) : 61.6   Vital Signs: Temp: 97.8 F (36.6 C) (03/18 1311) Temp src: Oral (03/18 1311) BP: 123/74 mmHg (03/18 1311) Pulse Rate: 60  (03/18 1311)  Labs:  Basename 10/31/11 0620 10/30/11 0455 10/29/11 1543  HGB 10.0* 10.6* --  HCT 30.2* 32.2* 38.7  PLT 159 167 221  APTT -- -- 51*  LABPROT 28.9* 27.1* 22.9*  INR 2.67* 2.46* 1.99*  HEPARINUNFRC -- -- --  CREATININE 2.18* 2.03* 2.01*  CKTOTAL -- -- --  CKMB -- -- --  TROPONINI -- -- --   Estimated Creatinine Clearance: 26.2 ml/min (by C-G formula based on Cr of 2.18).  Medical History: Past Medical History  Diagnosis Date  . Cardiac pacemaker in situ   . Other specified cardiac dysrhythmias     bradycardia  . Anemia, unspecified   . Cellulitis and abscess of unspecified site   . CAD (coronary artery disease)   . Edema   . Hypothyroid   . Renal insufficiency   . Hypertension   . Gastrointestinal hemorrhage   . GERD (gastroesophageal reflux disease)   . Depression   . CHF (congestive heart failure)   . A-fib   . Warfarin anticoagulation   . SSS (sick sinus syndrome)   . Anemia   . GI bleed     due to ulcers  . H/O: hysterectomy     Medications:  Prescriptions prior to admission  Medication  Sig Dispense Refill  . amitriptyline-chlordiazePOXIDE (LIMBITROL) 12.5-5 MG per tablet Take 1 tablet by mouth daily.      Marland Kitchen amLODipine (NORVASC) 5 MG tablet Take 5 mg by mouth daily.        Marland Kitchen aspirin 81 MG tablet Take 81 mg by mouth daily.        . ferrous gluconate (FERGON) 325 MG tablet Take 325 mg by mouth daily with breakfast.       . furosemide (LASIX) 80 MG tablet Take 40 mg by mouth daily.       Marland Kitchen loperamide (IMODIUM) 2 MG capsule Take 2 mg by mouth daily as needed. For diarrhea      . lovastatin (MEVACOR) 10 MG tablet Take 10 mg by mouth at bedtime.        . magnesium oxide (MAG-OX) 400 MG tablet Take 400 mg by mouth daily.       . metoprolol (LOPRESSOR) 50 MG tablet Take 100 mg by mouth 2 (two) times daily.       Marland Kitchen oxyCODONE-acetaminophen (PERCOCET) 5-325 MG per tablet Take 1 tablet by mouth every 4 (four) hours as needed. For pain      . pantoprazole (PROTONIX) 40 MG tablet Take 40 mg by mouth daily.        Marland Kitchen  potassium chloride (KLOR-CON) 10 MEQ CR tablet Take 10 mEq by mouth 2 (two) times daily.       . sucralfate (CARAFATE) 1 G tablet Take 1 g by mouth 3 (three) times daily.        Marland Kitchen warfarin (COUMADIN) 2 MG tablet Take 2-2.5 mg by mouth daily. Pt takes 1 & 1/2 tabs on m, Friday, and 1 tab all other days      . DISCONTD: amitriptyline-chlordiazePOXIDE (LIMBITROL) 5-12.5 MG per tablet Take 1 tablet by mouth daily.

## 2011-10-31 NOTE — Progress Notes (Signed)
Subjective: Doing better, suprapubic area not as tender  Objective: Vital signs in last 24 hours: Temp:  [97.4 F (36.3 C)-97.8 F (36.6 C)] 97.8 F (36.6 C) (03/18 1311) Pulse Rate:  [59-66] 60  (03/18 1311) Resp:  [18-20] 18  (03/18 1311) BP: (123-150)/(74-85) 123/74 mmHg (03/18 1311) SpO2:  [93 %-96 %] 96 % (03/18 1311) Weight change: -0.5 kg (-1 lb 1.6 oz) Last BM Date: 10/29/11  Intake/Output from previous day: 03/17 0701 - 03/18 0700 In: 720 [P.O.:720] Out: 275 [Urine:275] Total I/O In: 360 [P.O.:360] Out: -    Physical Exam: General: Alert, awake, oriented x3, in no acute distress. HEENT: No bruits, no goiter. Heart: Regular rate and rhythm, without murmurs, rubs, gallops. Lungs: Clear to auscultation bilaterally. Abdomen: Soft, nontender, nondistended, positive bowel sounds. Large Suprapubic triangular area of erythema and induration, 1cm I&D site with minimal purulence and gauze insitu, redness still around marked margins Extremities: No clubbing cyanosis or edema with positive pedal pulses. Neuro: Grossly intact, nonfocal.    Lab Results: Basic Metabolic Panel:  Basename 10/31/11 0620 10/30/11 0455  NA 136 139  K 3.5 3.4*  CL 100 104  CO2 27 26  GLUCOSE 100* 117*  BUN 30* 26*  CREATININE 2.18* 2.03*  CALCIUM 8.6 8.6  MG -- --  PHOS -- --   Liver Function Tests: No results found for this basename: AST:2,ALT:2,ALKPHOS:2,BILITOT:2,PROT:2,ALBUMIN:2 in the last 72 hours No results found for this basename: LIPASE:2,AMYLASE:2 in the last 72 hours No results found for this basename: AMMONIA:2 in the last 72 hours CBC:  Basename 10/31/11 0620 10/30/11 0455 10/29/11 1543  WBC 4.4 5.2 --  NEUTROABS -- -- 4.5  HGB 10.0* 10.6* --  HCT 30.2* 32.2* --  MCV 85.6 85.6 --  PLT 159 167 --   Cardiac Enzymes: No results found for this basename: CKTOTAL:3,CKMB:3,CKMBINDEX:3,TROPONINI:3 in the last 72 hours BNP: No results found for this basename: PROBNP:3 in  the last 72 hours D-Dimer: No results found for this basename: DDIMER:2 in the last 72 hours CBG: No results found for this basename: GLUCAP:6 in the last 72 hours Hemoglobin A1C: No results found for this basename: HGBA1C in the last 72 hours Fasting Lipid Panel: No results found for this basename: CHOL,HDL,LDLCALC,TRIG,CHOLHDL,LDLDIRECT in the last 72 hours Thyroid Function Tests: No results found for this basename: TSH,T4TOTAL,FREET4,T3FREE,THYROIDAB in the last 72 hours Anemia Panel: No results found for this basename: VITAMINB12,FOLATE,FERRITIN,TIBC,IRON,RETICCTPCT in the last 72 hours Coagulation:  Basename 10/31/11 0620 10/30/11 0455  LABPROT 28.9* 27.1*  INR 2.67* 2.46*   Urine Drug Screen: Drugs of Abuse  No results found for this basename: labopia,  cocainscrnur,  labbenz,  amphetmu,  thcu,  labbarb    Alcohol Level: No results found for this basename: ETH:2 in the last 72 hours Urinalysis: No results found for this basename: COLORURINE:2,APPERANCEUR:2,LABSPEC:2,PHURINE:2,GLUCOSEU:2,HGBUR:2,BILIRUBINUR:2,KETONESUR:2,PROTEINUR:2,UROBILINOGEN:2,NITRITE:2,LEUKOCYTESUR:2 in the last 72 hours  Recent Results (from the past 240 hour(s))  CULTURE, BLOOD (ROUTINE X 2)     Status: Normal (Preliminary result)   Collection Time   10/29/11  3:50 PM      Component Value Range Status Comment   Specimen Description BLOOD RIGHT ARM   Final    Special Requests BOTTLES DRAWN AEROBIC ONLY 10CC   Final    Culture  Setup Time [id number]   Final    Culture     Final    Value:        BLOOD CULTURE RECEIVED NO GROWTH TO DATE CULTURE WILL BE HELD FOR 5  DAYS BEFORE ISSUING A FINAL NEGATIVE REPORT   Report Status PENDING   Incomplete   CULTURE, BLOOD (ROUTINE X 2)     Status: Normal (Preliminary result)   Collection Time   10/29/11  3:55 PM      Component Value Range Status Comment   Specimen Description BLOOD LEFT ARM   Final    Special Requests BOTTLES DRAWN AEROBIC AND ANAEROBIC 10CC    Final    Culture  Setup Time [id number]   Final    Culture     Final    Value:        BLOOD CULTURE RECEIVED NO GROWTH TO DATE CULTURE WILL BE HELD FOR 5 DAYS BEFORE ISSUING A FINAL NEGATIVE REPORT   Report Status PENDING   Incomplete   WOUND CULTURE     Status: Normal (Preliminary result)   Collection Time   10/29/11  5:35 PM      Component Value Range Status Comment   Specimen Description WOUND ABDOMEN   Final    Special Requests LOWER   Final    Gram Stain     Final    Value: MODERATE WBC PRESENT, PREDOMINANTLY PMN     NO SQUAMOUS EPITHELIAL CELLS SEEN     MODERATE GRAM POSITIVE COCCI IN PAIRS   Culture     Final    Value: ABUNDANT STAPHYLOCOCCUS AUREUS     Note: RIFAMPIN AND GENTAMICIN SHOULD NOT BE USED AS SINGLE DRUGS FOR TREATMENT OF STAPH INFECTIONS.   Report Status PENDING   Incomplete     Studies/Results: No results found.  Medications: Scheduled Meds:    . amitriptyline  12.5 mg Oral QHS  . amLODipine  5 mg Oral Daily  . aspirin EC  81 mg Oral Daily  . chlordiazePOXIDE  5 mg Oral Daily  . ferrous gluconate  324 mg Oral Q breakfast  . furosemide  40 mg Oral Daily  . magnesium oxide  400 mg Oral Daily  . metoprolol  100 mg Oral BID  . pantoprazole  40 mg Oral Daily  . potassium chloride  10 mEq Oral Daily  . senna  1 tablet Oral BID  . simvastatin  5 mg Oral q1800  . sodium chloride  3 mL Intravenous Q12H  . sucralfate  1 g Oral TID  . vancomycin  750 mg Intravenous Q24H  . warfarin  2 mg Oral ONCE-1800  . Warfarin - Pharmacist Dosing Inpatient   Does not apply q1800  . DISCONTD: clindamycin (CLEOCIN) IV  600 mg Intravenous Q8H   Continuous Infusions:    . sodium chloride 10 mL/hr at 10/30/11 1051   PRN Meds:.acetaminophen, acetaminophen, albuterol, loperamide, morphine, ondansetron (ZOFRAN) IV, ondansetron, oxyCODONE-acetaminophen, zolpidem  Assessment/Plan:  ASSESSMENT AND PLAN:  Present on Admission:  .Cellulitis and abscess of abdominal wall    -Status post incision and drainage in ER unchanged FU wound cultures Wound care consult Change to IV Vancomycin Gentle IVF  .  Marland KitchenRENAL INSUFFICIENCY-chronic kidney disease stage III-IV  stable  .HYPERTENSION  -continue amlodipine and metoprolol.   Marland KitchenGERD  -Continue Protonix and sucralfate.   Marland Kitchen History of BRADYCARDIA  -Permanent pacemaker in place   .CAD, UNSPECIFIED SITE  -Status post PTCA , Continue aspirin   .Atrial fibrillation  -On chronic Coumadin therapy, currently rate controlled  -Coumadin will be managed by pharmacy   .Congestive heart failure, unspecified  -Likely diastolic heart failure, clinically compensated   DVT Prophylaxis:  -Not needed as on chronic Coumadin therapy  Code Status:  Full code   ambulate    LOS: 2 days   [facility] Pager: [phone] 10/31/2011, 1:21 PM

## 2011-10-31 NOTE — Progress Notes (Signed)
ANTIBIOTIC CONSULT NOTE - INITIAL  Pharmacy Consult for vancomycin Indication: suprapubic cellulitis  Allergies  Allergen Reactions  . Amiodarone     Patient Measurements: Height: 5\' 7"  (170.2 cm) Weight: 199 lb 8.3 oz (90.5 kg) IBW/kg (Calculated) : 61.6   Vital Signs: Temp: 97.8 F (36.6 C) (03/18 0505) Temp src: Oral (03/18 0505) BP: 130/78 mmHg (03/18 0505) Pulse Rate: 64  (03/18 0505)  Labs:  Basename 10/31/11 0620 10/30/11 0455 10/29/11 1543  WBC 4.4 5.2 6.3  HGB 10.0* 10.6* 12.7  PLT 159 167 221  LABCREA -- -- --  CREATININE -- 2.03* 2.01*   Estimated Creatinine Clearance: 28.1 ml/min (by C-G formula based on Cr of 2.03).  Microbiology: Recent Results (from the past 720 hour(s))  CULTURE, BLOOD (ROUTINE X 2)     Status: Normal (Preliminary result)   Collection Time   10/29/11  3:50 PM      Component Value Range Status Comment   Specimen Description BLOOD RIGHT ARM   Final    Special Requests BOTTLES DRAWN AEROBIC ONLY 10CC   Final    Culture  Setup Time [id number]   Final    Culture     Final    Value:        BLOOD CULTURE RECEIVED NO GROWTH TO DATE CULTURE WILL BE HELD FOR 5 DAYS BEFORE ISSUING A FINAL NEGATIVE REPORT   Report Status PENDING   Incomplete   CULTURE, BLOOD (ROUTINE X 2)     Status: Normal (Preliminary result)   Collection Time   10/29/11  3:55 PM      Component Value Range Status Comment   Specimen Description BLOOD LEFT ARM   Final    Special Requests BOTTLES DRAWN AEROBIC AND ANAEROBIC 10CC   Final    Culture  Setup Time [id number]   Final    Culture     Final    Value:        BLOOD CULTURE RECEIVED NO GROWTH TO DATE CULTURE WILL BE HELD FOR 5 DAYS BEFORE ISSUING A FINAL NEGATIVE REPORT   Report Status PENDING   Incomplete   WOUND CULTURE     Status: Normal (Preliminary result)   Collection Time   10/29/11  5:35 PM      Component Value Range Status Comment   Specimen Description WOUND ABDOMEN   Final    Special Requests LOWER    Final    Gram Stain     Final    Value: MODERATE WBC PRESENT, PREDOMINANTLY PMN     NO SQUAMOUS EPITHELIAL CELLS SEEN     MODERATE GRAM POSITIVE COCCI IN PAIRS   Culture     Final    Value: ABUNDANT STAPHYLOCOCCUS AUREUS     Note: RIFAMPIN AND GENTAMICIN SHOULD NOT BE USED AS SINGLE DRUGS FOR TREATMENT OF STAPH INFECTIONS.   Report Status PENDING   Incomplete     Medical History: Past Medical History  Diagnosis Date  . Cardiac pacemaker in situ   . Other specified cardiac dysrhythmias     bradycardia  . Anemia, unspecified   . Cellulitis and abscess of unspecified site   . CAD (coronary artery disease)   . Edema   . Hypothyroid   . Renal insufficiency   . Hypertension   . Gastrointestinal hemorrhage   . GERD (gastroesophageal reflux disease)   . Depression   . CHF (congestive heart failure)   . A-fib   . Warfarin anticoagulation   . SSS (sick sinus  syndrome)   . Anemia   . GI bleed     due to ulcers  . H/O: hysterectomy     Medications:  Prescriptions prior to admission  Medication Sig Dispense Refill  . amitriptyline-chlordiazePOXIDE (LIMBITROL) 12.5-5 MG per tablet Take 1 tablet by mouth daily.      Marland Kitchen amLODipine (NORVASC) 5 MG tablet Take 5 mg by mouth daily.        Marland Kitchen aspirin 81 MG tablet Take 81 mg by mouth daily.        . ferrous gluconate (FERGON) 325 MG tablet Take 325 mg by mouth daily with breakfast.       . furosemide (LASIX) 80 MG tablet Take 40 mg by mouth daily.       Marland Kitchen loperamide (IMODIUM) 2 MG capsule Take 2 mg by mouth daily as needed. For diarrhea      . lovastatin (MEVACOR) 10 MG tablet Take 10 mg by mouth at bedtime.        . magnesium oxide (MAG-OX) 400 MG tablet Take 400 mg by mouth daily.       . metoprolol (LOPRESSOR) 50 MG tablet Take 100 mg by mouth 2 (two) times daily.       Marland Kitchen oxyCODONE-acetaminophen (PERCOCET) 5-325 MG per tablet Take 1 tablet by mouth every 4 (four) hours as needed. For pain      . pantoprazole (PROTONIX) 40 MG tablet Take  40 mg by mouth daily.        . potassium chloride (KLOR-CON) 10 MEQ CR tablet Take 10 mEq by mouth 2 (two) times daily.       . sucralfate (CARAFATE) 1 G tablet Take 1 g by mouth 3 (three) times daily.        Marland Kitchen warfarin (COUMADIN) 2 MG tablet Take 2-2.5 mg by mouth daily. Pt takes 1 & 1/2 tabs on m, Friday, and 1 tab all other days      . DISCONTD: amitriptyline-chlordiazePOXIDE (LIMBITROL) 5-12.5 MG per tablet Take 1 tablet by mouth daily.         Scheduled:    . amitriptyline  12.5 mg Oral QHS  . amLODipine  5 mg Oral Daily  . aspirin EC  81 mg Oral Daily  . chlordiazePOXIDE  5 mg Oral Daily  . ferrous gluconate  324 mg Oral Q breakfast  . furosemide  40 mg Oral Daily  . magnesium oxide  400 mg Oral Daily  . metoprolol  100 mg Oral BID  . pantoprazole  40 mg Oral Daily  . potassium chloride  10 mEq Oral Daily  . senna  1 tablet Oral BID  . simvastatin  5 mg Oral q1800  . sodium chloride  3 mL Intravenous Q12H  . sucralfate  1 g Oral TID  . warfarin  2 mg Oral ONCE-1800  . Warfarin - Pharmacist Dosing Inpatient   Does not apply q1800  . DISCONTD: clindamycin (CLEOCIN) IV  600 mg Intravenous Q8H   Assessment: 77yo female had been on clinda, now to begin vancomycin for suprapubic cellulitis.  Goal of Therapy:  Vancomycin trough level 10-15 mcg/ml  Plan:  Will begin vancomycin 750mg  IV Q24H and monitor CBC, Cx, levels prn.  Colleen Can PharmD BCPS 10/31/2011,7:48 AM

## 2011-10-31 NOTE — Progress Notes (Signed)
Received iodoform gauze for suprapubic wound; placed small strip into wound site using sterile technique with Qtip; covered with 2X2 gauze and tape; dressing will not stay on without tape; patient wanted mesh underwear off. Will Monitor for drainage through the dressing; no drainage at present.

## 2011-10-31 NOTE — Progress Notes (Addendum)
INITIAL ADULT NUTRITION ASSESSMENT Date: 10/31/2011   Time: 1:19 PM  Reason for Assessment: Nutrition Risk - Problems chewing or swallowing foods and/or liquids  ASSESSMENT: Female 77 y.o.  Dx: Cellulitis of abdominal wall  Hx:  Past Medical History  Diagnosis Date  . Cardiac pacemaker in situ   . Other specified cardiac dysrhythmias     bradycardia  . Anemia, unspecified   . Cellulitis and abscess of unspecified site   . CAD (coronary artery disease)   . Edema   . Hypothyroid   . Renal insufficiency   . Hypertension   . Gastrointestinal hemorrhage   . GERD (gastroesophageal reflux disease)   . Depression   . CHF (congestive heart failure)   . A-fib   . Warfarin anticoagulation   . SSS (sick sinus syndrome)   . Anemia   . GI bleed     due to ulcers  . H/O: hysterectomy     Related Meds:     . amitriptyline  12.5 mg Oral QHS  . amLODipine  5 mg Oral Daily  . aspirin EC  81 mg Oral Daily  . chlordiazePOXIDE  5 mg Oral Daily  . ferrous gluconate  324 mg Oral Q breakfast  . furosemide  40 mg Oral Daily  . magnesium oxide  400 mg Oral Daily  . metoprolol  100 mg Oral BID  . pantoprazole  40 mg Oral Daily  . potassium chloride  10 mEq Oral Daily  . senna  1 tablet Oral BID  . simvastatin  5 mg Oral q1800  . sodium chloride  3 mL Intravenous Q12H  . sucralfate  1 g Oral TID  . vancomycin  750 mg Intravenous Q24H  . warfarin  2 mg Oral ONCE-1800  . Warfarin - Pharmacist Dosing Inpatient   Does not apply q1800  . DISCONTD: clindamycin (CLEOCIN) IV  600 mg Intravenous Q8H     Ht: 5\' 7"  (170.2 cm)  Wt: 199 lb 8.3 oz (90.5 kg)  Ideal Wt: 61.4 kg % Ideal Wt: 147.4%  Usual Wt: 195 lb (88.6 kg) per patient report % Usual Wt: 102%  Body mass index is 31.25 kg/(m^2).Class I Obesity  Food/Nutrition Related Hx: Wound care has been consulted due to cellulitis. No BMs to document  Patient reported poor appetite prior to admission, rarely able to consume three  meals a day. Continues to have issues with swallowing most foods. Has been able to consume about 50% of meals since admitted per pt. Per documentation pt consumed 100% of Lunch.   Labs:  CMP     Component Value Date/Time   NA 136 10/31/2011 0620   K 3.5 10/31/2011 0620   CL 100 10/31/2011 0620   CO2 27 10/31/2011 0620   GLUCOSE 100* 10/31/2011 0620   BUN 30* 10/31/2011 0620   CREATININE 2.18* 10/31/2011 0620   CALCIUM 8.6 10/31/2011 0620   CALCIUM 8.7 03/02/2007 0505   PROT 7.6 08/03/2009 1028   ALBUMIN 3.7 08/03/2009 1028   AST 26 08/03/2009 1028   ALT 20 08/03/2009 1028   ALKPHOS 80 08/03/2009 1028   BILITOT 0.7 08/03/2009 1028   GFRNONAA 21* 10/31/2011 0620   GFRAA 24* 10/31/2011 0620      Intake/Output Summary (Last 24 hours) at 10/31/11 1325 Last data filed at 10/31/11 1300  Gross per 24 hour  Intake    480 ml  Output      0 ml  Net    480 ml  Diet Order: Cardiac  Supplements/Tube Feeding: N/A  IVF:    sodium chloride Last Rate: 10 mL/hr at 10/30/11 1051    Estimated Nutritional Needs:   Kcal:1750 - 1950 Protein: 55 - 65 gm Fluid: > 1.7 L  Discussed supplement options with patient. She had tried Ensure before, and was willing to consume along with meals to provide adequate energy and protein for post-op recovery. Will recommend a speech evaluation to better assess diet tolerances.   NUTRITION DIAGNOSIS: -Inadequate oral intake (NI-2.1).  Status: Ongoing  RELATED TO: history of dysphagia  AS EVIDENCE BY: < 50% of meals consumed  MONITORING/EVALUATION(Goals): Goal: Consume >/= 90% of estimated needs Monitor: SLP consult, PO intake, supplement tolerance, weights, labs  EDUCATION NEEDS: -No education needs identified at this time  INTERVENTION:  Supplement Ensure Complete BID to provide additional 700 kcal, 26 gram protein and 360 ml free water  Recommend SLP consult to better assess level of dysphagia  RD to follow nutrition care plan   Dietitian  #:[phone]  DOCUMENTATION CODES Per approved criteria  -Obesity Unspecified    Lloyd Huger 10/31/2011, 1:19 PM  Kendell Bane Cornelison [phone]

## 2011-11-01 LAB — WOUND CULTURE

## 2011-11-01 LAB — PROTIME-INR: INR: 2.27 — ABNORMAL HIGH (ref 0.00–1.49)

## 2011-11-01 MED ORDER — DOXYCYCLINE HYCLATE 100 MG PO TABS: 100.0000 mg | ORAL_TABLET | Freq: Two times a day (BID) | ORAL | Status: AC

## 2011-11-01 MED ORDER — WARFARIN SODIUM 2 MG PO TABS
2.0000 mg | ORAL_TABLET | Freq: Once | ORAL | Status: DC
  Filled 2011-11-01: qty 1

## 2011-11-01 NOTE — Progress Notes (Signed)
Utilization Review Completed.Kwadwo Taras T3/19/2013   

## 2011-11-01 NOTE — Progress Notes (Signed)
   CARE MANAGEMENT NOTE 11/01/2011  Patient:  [name], [name]   Account Number:  [id number]  Date Initiated:  11/01/2011  Documentation initiated by:  Darlyne Russian  Subjective/Objective Assessment:   Patient admitted with cellulitis of abdominal wall.     Action/Plan:   Patient lives at home with her son. Plan to discharge home.   Anticipated DC Date:  11/01/2011   Anticipated DC Plan:  HOME W HOME HEALTH SERVICES      DC Planning Services  CM consult      [facility] Choice  HOME HEALTH   Choice offered to / List presented to:  C-1 Patient        HH arranged  HH-1 RN      [facility] agency  [facility]   Status of service:  Completed, signed off Medicare Important Message given?   (If response is "NO", the following Medicare IM given date fields will be blank) Date Medicare IM given:   Date Additional Medicare IM given:    Discharge Disposition:  HOME W HOME HEALTH SERVICES  Per UR Regulation:    If discussed at Long Length of Stay Meetings, dates discussed:    Comments:  PCP: Dr. [name]  11/01/2011 3:45 pm Darlyne Russian RN, CCM [phone]  Met with patient to discuss discharge planning and home health RN visits. She does not recall the name of the agency she had used in the past. Per [facility] the patient was active with them a few years ago. Provided information to patient and she requested to use [facility] for [facility]. Referral to [facility].  Patient to be discharged to home today.

## 2011-11-01 NOTE — Progress Notes (Signed)
ANTICOAGULATION CONSULT NOTE - Follow Up Consult  Pharmacy Consult for Coumadin Indication: h/o afib  Admit Complaint: 77 yo female on coumadin PTA for afib, who was admitted with abdominal wall cellulitis/abscess requiring antiobiotics. Pharmacy consulted to dose warfarin and clindamycin  Home coumadin dose: 2mg  daily except 3mg  on Mon & Fri  Assessment:  Anticoagulation: atrial fibrillation,  INR today is therapeutic , no bleeding issues reported.    Infectious Diseases: Suprapubic abscess: clindamycin per Rx for abdominal wall cellulitis/abscess,  WBC down,  Afebrile   Vancomycin 3/18->  Cultures: 3/16 Blood Cultures - No growth 3/16 Wound - abundant MRSA  Neurology: hx/o depression - amitriptyline, librium  Pulmonary: RA  Cardiovascular: hx/o CAD/HTN/CHF/Afib - BP and HR at goal, amlo, asa, lasix, metoprolol, statin  Gastrointestinal: hx/o GI bleed/ulcers - PPI, sucralfate  Endocrinology: no hx DM - CBGs ok  Fluids/Electrolytes/Nutrition: K = 3.5; on KCL 10 meq daily  Nephrology: CKD Stg 3-4; Scr 2.18, CrCl~28; fairly stable  Hematology/Oncology: CBC ok and stable  PTA Medications: All home medications ordered  Best Practices: DVT Px : coumadin,  Goals of Therapy: INR 2.0-3.0  Plan:  -Coumadin 2 mg PO x1 today. Will try to keep on home dose -  Allergies  Allergen Reactions  . Amiodarone     Patient Measurements: Height: 5\' 7"  (170.2 cm) Weight: 199 lb 8.3 oz (90.5 kg) IBW/kg (Calculated) : 61.6   Vital Signs: Temp: 98 F (36.7 C) (03/19 0932) Temp src: Axillary (03/19 0932) BP: 134/75 mmHg (03/19 0932) Pulse Rate: 66  (03/19 0932)  Labs:  Basename 11/01/11 0500 10/31/11 0620 10/30/11 0455 10/29/11 1543  HGB -- 10.0* 10.6* --  HCT -- 30.2* 32.2* 38.7  PLT -- 159 167 221  APTT -- -- -- 51*  LABPROT 25.4* 28.9* 27.1* --  INR 2.27* 2.67* 2.46* --  HEPARINUNFRC -- -- -- --  CREATININE -- 2.18* 2.03* 2.01*  CKTOTAL -- -- -- --  CKMB -- -- --  --  TROPONINI -- -- -- --   Estimated Creatinine Clearance: 26.2 ml/min (by C-G formula based on Cr of 2.18).  Medical History: Past Medical History  Diagnosis Date  . Cardiac pacemaker in situ   . Other specified cardiac dysrhythmias     bradycardia  . Anemia, unspecified   . Cellulitis and abscess of unspecified site   . CAD (coronary artery disease)   . Edema   . Hypothyroid   . Renal insufficiency   . Hypertension   . Gastrointestinal hemorrhage   . GERD (gastroesophageal reflux disease)   . Depression   . CHF (congestive heart failure)   . A-fib   . Warfarin anticoagulation   . SSS (sick sinus syndrome)   . Anemia   . GI bleed     due to ulcers  . H/O: hysterectomy     Medications:  Prescriptions prior to admission  Medication Sig Dispense Refill  . amitriptyline-chlordiazePOXIDE (LIMBITROL) 12.5-5 MG per tablet Take 1 tablet by mouth daily.      Marland Kitchen amLODipine (NORVASC) 5 MG tablet Take 5 mg by mouth daily.        Marland Kitchen aspirin 81 MG tablet Take 81 mg by mouth daily.        . ferrous gluconate (FERGON) 325 MG tablet Take 325 mg by mouth daily with breakfast.       . furosemide (LASIX) 80 MG tablet Take 40 mg by mouth daily.       Marland Kitchen loperamide (IMODIUM) 2 MG capsule  Take 2 mg by mouth daily as needed. For diarrhea      . lovastatin (MEVACOR) 10 MG tablet Take 10 mg by mouth at bedtime.        . magnesium oxide (MAG-OX) 400 MG tablet Take 400 mg by mouth daily.       . metoprolol (LOPRESSOR) 50 MG tablet Take 100 mg by mouth 2 (two) times daily.       Marland Kitchen oxyCODONE-acetaminophen (PERCOCET) 5-325 MG per tablet Take 1 tablet by mouth every 4 (four) hours as needed. For pain      . pantoprazole (PROTONIX) 40 MG tablet Take 40 mg by mouth daily.        . potassium chloride (KLOR-CON) 10 MEQ CR tablet Take 10 mEq by mouth 2 (two) times daily.       . sucralfate (CARAFATE) 1 G tablet Take 1 g by mouth 3 (three) times daily.        Marland Kitchen warfarin (COUMADIN) 2 MG tablet Take 2-2.5 mg  by mouth daily. Pt takes 1 & 1/2 tabs on m, Friday, and 1 tab all other days      . DISCONTD: amitriptyline-chlordiazePOXIDE (LIMBITROL) 5-12.5 MG per tablet Take 1 tablet by mouth daily.          Lovenia Kim Pharm.D., BCPS Clinical Pharmacist 11/01/2011 10:24 AM Pager: [phone] Phone: [phone]

## 2011-11-01 NOTE — Discharge Summary (Signed)
Physician Discharge Summary  Patient ID: [name] MRN: [id number] DOB/AGE: 1934-12-20 77 y.o.  Admit date: 10/29/2011 Discharge date: 11/01/2011  Primary Care Physician:  [name], MD, MD   Discharge Diagnoses:   1. MRSA Cellulitis of abdominal wall 2.  ANEMIA of chronic disease 3. chronic kidney disease stage 3 4. HYPERTENSION 5.  CAD, status post PTCA 6. paroxysmal Atrial fibrillation on Coumadin 7. history of diastolic Congestive heart failure 8. GERD 9. PACEMAKER, PERMANENT 10. Depression 11. Hypothyroidism   Medication List  As of 11/01/2011  3:17 PM   TAKE these medications         amitriptyline-chlordiazePOXIDE 12.5-5 MG per tablet   Commonly known as: LIMBITROL   Take 1 tablet by mouth daily.      amLODipine 5 MG tablet   Commonly known as: NORVASC   Take 5 mg by mouth daily.      aspirin 81 MG tablet   Take 81 mg by mouth daily.      doxycycline 100 MG tablet   Commonly known as: VIBRA-TABS   Take 1 tablet (100 mg total) by mouth 2 (two) times daily. For 7days      ferrous gluconate 325 MG tablet   Commonly known as: FERGON   Take 325 mg by mouth daily with breakfast.      furosemide 80 MG tablet   Commonly known as: LASIX   Take 40 mg by mouth daily.      loperamide 2 MG capsule   Commonly known as: IMODIUM   Take 2 mg by mouth daily as needed. For diarrhea      lovastatin 10 MG tablet   Commonly known as: MEVACOR   Take 10 mg by mouth at bedtime.      magnesium oxide 400 MG tablet   Commonly known as: MAG-OX   Take 400 mg by mouth daily.      metoprolol 50 MG tablet   Commonly known as: LOPRESSOR   Take 100 mg by mouth 2 (two) times daily.      oxyCODONE-acetaminophen 5-325 MG per tablet   Commonly known as: PERCOCET   Take 1 tablet by mouth every 4 (four) hours as needed. For pain      potassium chloride 10 MEQ CR tablet   Commonly known as: KLOR-CON   Take 10 mEq by mouth 2 (two) times daily.      PROTONIX 40 MG tablet     Generic drug: pantoprazole   Take 40 mg by mouth daily.      sucralfate 1 G tablet   Commonly known as: CARAFATE   Take 1 g by mouth 3 (three) times daily.      warfarin 2 MG tablet   Commonly known as: COUMADIN   Take 2-2.5 mg by mouth daily. Pt takes 1 & 1/2 tabs on m, Friday, and 1 tab all other days             Disposition and Follow-up:  1. Dr. Clarene Duke in one week  Significant Diagnostic Studies:  No results found.  Brief H and P: Patient is a 77 year old Caucasian female with a past medical history of atrial fibrillation on chronic Coumadin therapy, history of bradycardia status post permanent pacemaker implantation, hypertension, history of coronary artery disease status post PTCA presented to the hospital for the above-noted complaints. Family for the past 3 days patient has been having swelling and erythema in the anterior abdominal wall and a suprapubic area. She denies any fever but  complained that the swelling did worsen over the past 1 or 2 days and hence she came to the ED. She was found to have a small abscess in the suprapubic area, she underwent incision and drainage here in the emergency room, however she has a large cellulitic surrounding area and has now been transferred to the hospitalist service for admission for intravenous antibiotics. Patient currently denies any other complaints like chest pain or shortness of breath. She denies any headache. She denies any nausea, vomiting or diarrhea. She denies any leg swelling as well.  Hospital Course:  .Cellulitis and abscess of abdominal wall  -Status post incision and drainage in ER  WOUND CULTURES GREW MRSA, she was originally treated with clindamycin subsequently transitioned to IV vancomycin and being discharged home on oral doxycycline to complete a ten-day course of antibiotics. She has had good clinical improvement with antibiotic therapy She will have a wound care nurse followup at home She is advised to  followup with primary care physician in one week .  Marland KitchenRENAL INSUFFICIENCY-chronic kidney disease stage III-IV  stable   .HYPERTENSION  -continue amlodipine and metoprolol.   Marland KitchenGERD  -Continue Protonix and sucralfate.   Marland Kitchen History of BRADYCARDIA  -Permanent pacemaker in place   .CAD, UNSPECIFIED SITE  -Status post PTCA , Continue aspirin   .Atrial fibrillation  -On chronic Coumadin therapy, currently rate controlled    .Congestive heart failure, unspecified  -Likely diastolic heart failure, clinically compensated       Time spent on Discharge:  Signed: Chrystal Zeimet [facility]  11/01/2011, 3:17 PM

## 2011-11-04 LAB — CULTURE, BLOOD (ROUTINE X 2)
Culture  Setup Time: 201303162007
Culture  Setup Time: 201303162007
Culture: NO GROWTH

## 2011-11-08 ENCOUNTER — Encounter: Payer: Medicare Other | Admitting: Internal Medicine

## 2011-11-09 ENCOUNTER — Ambulatory Visit: Payer: Medicare Other | Admitting: Cardiovascular Disease

## 2011-11-23 ENCOUNTER — Emergency Department (HOSPITAL_COMMUNITY): Payer: Medicare Other

## 2011-11-23 ENCOUNTER — Inpatient Hospital Stay (HOSPITAL_COMMUNITY)
Admission: EM | Admit: 2011-11-23 | Discharge: 2011-11-25 | DRG: 292 | Disposition: A | Discharge status: Home / Self Care | Payer: Medicare Other | Source: Ambulatory Visit | Attending: Internal Medicine | Admitting: Internal Medicine

## 2011-11-23 ENCOUNTER — Encounter (HOSPITAL_COMMUNITY): Payer: Self-pay | Admitting: *Deleted

## 2011-11-23 DIAGNOSIS — K219 Gastro-esophageal reflux disease without esophagitis: Secondary | ICD-10-CM | POA: Diagnosis present

## 2011-11-23 DIAGNOSIS — Z9861 Coronary angioplasty status: Secondary | ICD-10-CM

## 2011-11-23 DIAGNOSIS — I5033 Acute on chronic diastolic (congestive) heart failure: Secondary | ICD-10-CM

## 2011-11-23 DIAGNOSIS — I509 Heart failure, unspecified: Secondary | ICD-10-CM

## 2011-11-23 DIAGNOSIS — N184 Chronic kidney disease, stage 4 (severe): Secondary | ICD-10-CM | POA: Diagnosis present

## 2011-11-23 DIAGNOSIS — I2510 Atherosclerotic heart disease of native coronary artery without angina pectoris: Secondary | ICD-10-CM | POA: Diagnosis present

## 2011-11-23 DIAGNOSIS — N179 Acute kidney failure, unspecified: Secondary | ICD-10-CM

## 2011-11-23 DIAGNOSIS — I129 Hypertensive chronic kidney disease with stage 1 through stage 4 chronic kidney disease, or unspecified chronic kidney disease: Secondary | ICD-10-CM | POA: Diagnosis present

## 2011-11-23 DIAGNOSIS — Z7901 Long term (current) use of anticoagulants: Secondary | ICD-10-CM

## 2011-11-23 DIAGNOSIS — Z95810 Presence of automatic (implantable) cardiac defibrillator: Secondary | ICD-10-CM

## 2011-11-23 DIAGNOSIS — I5043 Acute on chronic combined systolic (congestive) and diastolic (congestive) heart failure: Principal | ICD-10-CM | POA: Diagnosis present

## 2011-11-23 DIAGNOSIS — I4891 Unspecified atrial fibrillation: Secondary | ICD-10-CM | POA: Diagnosis present

## 2011-11-23 DIAGNOSIS — R079 Chest pain, unspecified: Secondary | ICD-10-CM

## 2011-11-23 DIAGNOSIS — E039 Hypothyroidism, unspecified: Secondary | ICD-10-CM | POA: Diagnosis present

## 2011-11-23 HISTORY — DX: Unspecified osteoarthritis, unspecified site: M19.90

## 2011-11-23 HISTORY — DX: Personal history of other diseases of the digestive system: Z87.19

## 2011-11-23 HISTORY — DX: Peripheral vascular disease, unspecified: I73.9

## 2011-11-23 HISTORY — DX: Unspecified atrial fibrillation: I48.91

## 2011-11-23 HISTORY — DX: Migraine, unspecified, not intractable, without status migrainosus: G43.909

## 2011-11-23 HISTORY — DX: Encounter for other specified aftercare: Z51.89

## 2011-11-23 HISTORY — DX: Acute myocardial infarction, unspecified: I21.9

## 2011-11-23 HISTORY — DX: Thyrotoxicosis, unspecified without thyrotoxic crisis or storm: E05.90

## 2011-11-23 HISTORY — DX: Umbilical hernia without obstruction or gangrene: K42.9

## 2011-11-23 HISTORY — DX: Reserved for inherently not codable concepts without codable children: IMO0001

## 2011-11-23 HISTORY — DX: Cardiac murmur, unspecified: R01.1

## 2011-11-23 LAB — URINALYSIS, ROUTINE W REFLEX MICROSCOPIC
Bilirubin Urine: NEGATIVE
Protein, ur: NEGATIVE mg/dL
Urobilinogen, UA: 0.2 mg/dL (ref 0.0–1.0)

## 2011-11-23 LAB — CBC
MCH: 28.5 pg (ref 26.0–34.0)
MCHC: 32.7 g/dL (ref 30.0–36.0)
Platelets: 176 10*3/uL (ref 150–400)
RBC: 3.89 MIL/uL (ref 3.87–5.11)
RDW: 15.8 % — ABNORMAL HIGH (ref 11.5–15.5)

## 2011-11-23 LAB — PROTIME-INR: Prothrombin Time: 36 seconds — ABNORMAL HIGH (ref 11.6–15.2)

## 2011-11-23 LAB — PRO B NATRIURETIC PEPTIDE: Pro B Natriuretic peptide (BNP): 4964 pg/mL — ABNORMAL HIGH (ref 0–450)

## 2011-11-23 LAB — URINE MICROSCOPIC-ADD ON

## 2011-11-23 LAB — BASIC METABOLIC PANEL
Calcium: 9.3 mg/dL (ref 8.4–10.5)
GFR calc Af Amer: 25 mL/min — ABNORMAL LOW (ref >90)
GFR calc non Af Amer: 21 mL/min — ABNORMAL LOW (ref >90)
Sodium: 140 mEq/L (ref 135–145)

## 2011-11-23 LAB — CARDIAC PANEL(CRET KIN+CKTOT+MB+TROPI): Troponin I: <0.3 ng/mL (ref <0.30)

## 2011-11-23 MED ORDER — MAGNESIUM OXIDE 400 MG PO TABS
400.0000 mg | ORAL_TABLET | Freq: Every day | ORAL | Status: DC
  Administered 2011-11-24 – 2011-11-25 (×2): 400 mg via ORAL
  Filled 2011-11-23 (×2): qty 1

## 2011-11-23 MED ORDER — ACETAMINOPHEN 325 MG PO TABS: 650.0000 mg | ORAL_TABLET | ORAL | Status: DC | PRN

## 2011-11-23 MED ORDER — SIMVASTATIN 5 MG PO TABS
5.0000 mg | ORAL_TABLET | Freq: Every day | ORAL | Status: DC
  Administered 2011-11-24: 5 mg via ORAL
  Filled 2011-11-23 (×2): qty 1

## 2011-11-23 MED ORDER — CHLORDIAZEPOXIDE-AMITRIPTYLINE 5-12.5 MG PO TABS: 1.0000 | ORAL_TABLET | Freq: Every day | ORAL | Status: DC

## 2011-11-23 MED ORDER — ASPIRIN 325 MG PO TABS: 325.0000 mg | ORAL_TABLET | ORAL | Status: DC

## 2011-11-23 MED ORDER — FUROSEMIDE 10 MG/ML IJ SOLN
80.0000 mg | Freq: Two times a day (BID) | INTRAMUSCULAR | Status: DC
  Administered 2011-11-23 – 2011-11-25 (×4): 80 mg via INTRAVENOUS
  Filled 2011-11-23 (×6): qty 8

## 2011-11-23 MED ORDER — PANTOPRAZOLE SODIUM 40 MG PO TBEC
40.0000 mg | DELAYED_RELEASE_TABLET | Freq: Every day | ORAL | Status: DC
  Administered 2011-11-24 – 2011-11-25 (×2): 40 mg via ORAL
  Filled 2011-11-23 (×2): qty 1

## 2011-11-23 MED ORDER — SODIUM CHLORIDE 0.9 % IJ SOLN: 3.0000 mL | INTRAMUSCULAR | Status: DC | PRN

## 2011-11-23 MED ORDER — ONDANSETRON HCL 4 MG/2ML IJ SOLN: 4.0000 mg | Freq: Four times a day (QID) | INTRAMUSCULAR | Status: DC | PRN

## 2011-11-23 MED ORDER — SODIUM CHLORIDE 0.9 % IJ SOLN
3.0000 mL | Freq: Two times a day (BID) | INTRAMUSCULAR | Status: DC
  Administered 2011-11-23 – 2011-11-25 (×4): 3 mL via INTRAVENOUS

## 2011-11-23 MED ORDER — AMLODIPINE BESYLATE 5 MG PO TABS
5.0000 mg | ORAL_TABLET | Freq: Every day | ORAL | Status: DC
  Administered 2011-11-24 – 2011-11-25 (×2): 5 mg via ORAL
  Filled 2011-11-23 (×2): qty 1

## 2011-11-23 MED ORDER — OXYCODONE-ACETAMINOPHEN 5-325 MG PO TABS
1.0000 | ORAL_TABLET | ORAL | Status: DC | PRN
  Administered 2011-11-25: 1 via ORAL
  Filled 2011-11-23: qty 1

## 2011-11-23 MED ORDER — ASPIRIN 81 MG PO CHEW
CHEWABLE_TABLET | ORAL | Status: AC
  Administered 2011-11-23: 243 mg via ORAL
  Filled 2011-11-23: qty 3

## 2011-11-23 MED ORDER — SODIUM CHLORIDE 0.9 % IV SOLN: 250.0000 mL | INTRAVENOUS | Status: DC | PRN

## 2011-11-23 MED ORDER — FERROUS GLUCONATE 324 (38 FE) MG PO TABS
325.0000 mg | ORAL_TABLET | Freq: Every day | ORAL | Status: DC
  Administered 2011-11-24: 324 mg via ORAL
  Filled 2011-11-23 (×3): qty 1

## 2011-11-23 MED ORDER — METOPROLOL TARTRATE 100 MG PO TABS
100.0000 mg | ORAL_TABLET | Freq: Two times a day (BID) | ORAL | Status: DC
  Administered 2011-11-23 – 2011-11-25 (×4): 100 mg via ORAL
  Filled 2011-11-23 (×5): qty 1

## 2011-11-23 MED ORDER — SUCRALFATE 1 G PO TABS
1.0000 g | ORAL_TABLET | Freq: Three times a day (TID) | ORAL | Status: DC
  Administered 2011-11-23 – 2011-11-25 (×5): 1 g via ORAL
  Filled 2011-11-23 (×7): qty 1

## 2011-11-23 MED ORDER — ZOLPIDEM TARTRATE 5 MG PO TABS: 5.0000 mg | ORAL_TABLET | Freq: Every evening | ORAL | Status: DC | PRN

## 2011-11-23 MED ORDER — POTASSIUM CHLORIDE CRYS ER 20 MEQ PO TBCR
40.0000 meq | EXTENDED_RELEASE_TABLET | Freq: Two times a day (BID) | ORAL | Status: DC
  Administered 2011-11-23 – 2011-11-25 (×4): 40 meq via ORAL
  Filled 2011-11-23 (×5): qty 2

## 2011-11-23 MED ORDER — ALPRAZOLAM 0.25 MG PO TABS
0.2500 mg | ORAL_TABLET | Freq: Two times a day (BID) | ORAL | Status: DC
  Administered 2011-11-23 – 2011-11-25 (×4): 0.25 mg via ORAL
  Filled 2011-11-23 (×3): qty 1

## 2011-11-23 MED ORDER — ASPIRIN 81 MG PO CHEW
324.0000 mg | CHEWABLE_TABLET | Freq: Once | ORAL | Status: AC
  Administered 2011-11-23: 243 mg via ORAL

## 2011-11-23 MED ORDER — ASPIRIN EC 81 MG PO TBEC
81.0000 mg | DELAYED_RELEASE_TABLET | Freq: Every day | ORAL | Status: DC
Start: 2011-11-24 — End: 2011-11-25
  Administered 2011-11-24 – 2011-11-25 (×2): 81 mg via ORAL
  Filled 2011-11-23 (×2): qty 1

## 2011-11-23 MED ORDER — WARFARIN - PHARMACIST DOSING INPATIENT
Freq: Every day | Status: DC
  Administered 2011-11-24: 18:00:00

## 2011-11-23 NOTE — ED Notes (Signed)
Patient undressed and in a gown. Cardiac monitor, pulse oximetry, and blood pressure cuff on. 

## 2011-11-23 NOTE — ED Notes (Signed)
Cardiology is at the bedside 

## 2011-11-23 NOTE — H&P (Signed)
History and Physical   Patient ID: [name] MRN: [id number], DOB/AGE: 04-07-1935   Admit date: 11/23/2011 Date of Consult: 11/23/2011   Primary Physician: [name], MD, MD Primary Cardiologist: Charlton Haws, MD  Pt. Profile: [name] is a 77yo female with PMHx significant for CAD (s/p DES-LCx), symptomatic bradycardia (SSS noted on history s/p AVN ablation, [employer] SENSIA Bi-V ICD implantation, upgraded 2008), permanent atrial fibrillation (on chronic Coumadin anticoagulation), HTN, CKD and GERD who presents to [facility] ED today with complaints of chest pain and shortness of breath. She notes that when she has chest pain, she always feels SOB but sometimes she will feel dyspnea without chest pain.  Patient has a history of GIB secondary to gastric ulcers in 2007.   Most recent cath in 10/09 revealed progressive LAD disease with a complex intervention of rotational atherectomy.   Most recent echo in 07/09 revealed LVEF 60%, mild LVH, moderate biatrial dilatation.  Most recent Myoview 06/10 revealed no evidence of ischemia.   Problem List: Past Medical History  Diagnosis Date  . Cardiac pacemaker in situ   . Other specified cardiac dysrhythmias     bradycardia  . Anemia, unspecified   . Cellulitis and abscess of unspecified site   . CAD (coronary artery disease)   . Edema   . Hypothyroid   . Renal insufficiency   . Hypertension   . Gastrointestinal hemorrhage   . GERD (gastroesophageal reflux disease)   . Depression   . CHF (congestive heart failure)   . A-fib   . Warfarin anticoagulation   . SSS (sick sinus syndrome)   . Anemia   . GI bleed     due to ulcers  . H/O: hysterectomy     Past Surgical History  Procedure Date  . Pacemaker insertion   . Esophagogastroduodenoscopy   . Coronary angioplasty     percutanous transluminal   . Cholecystectomy   . Appendectomy   . Cardiac cath 2006, 2008, 2009, 01/13/2011  . Lumbar disc surgery      Allergies:   Allergies  Allergen Reactions  . Amiodarone Hives     She reports increased sob and wheezing. She reports LE swelling. She endorses intermittent chest pain over the last few weeks. She describes the pain as burning. Associated with shortness of breath. She denies cough.   Upon ED arrival, EKG reveals atrial fibrillation, V-paced. POC and first set of troponin WNL. pBNP elevated at 4964. CXR without acute cardiopulmonary abnormalities. VSS.   Home Medications: Prior to Admission medications   Medication Sig Start Date End Date Taking? Authorizing Provider  ALPRAZolam (XANAX) 0.25 MG tablet Take 0.25 mg by mouth 2 (two) times daily.   Yes Historical Provider, MD  amitriptyline-chlordiazePOXIDE (LIMBITROL) 12.5-5 MG per tablet Take 1 tablet by mouth daily.   Yes Historical Provider, MD  amLODipine (NORVASC) 5 MG tablet Take 5 mg by mouth daily.     Yes Historical Provider, MD  aspirin 81 MG tablet Take 81 mg by mouth daily.     Yes Historical Provider, MD  ferrous gluconate (FERGON) 325 MG tablet Take 325 mg by mouth daily with breakfast.    Yes Historical Provider, MD  furosemide (LASIX) 80 MG tablet Take 40 mg by mouth daily.    Yes Historical Provider, MD  loperamide (IMODIUM) 2 MG capsule Take 2 mg by mouth daily as needed. For diarrhea   Yes Historical Provider, MD  lovastatin (MEVACOR) 10 MG tablet Take 10 mg by mouth at  bedtime.     Yes Historical Provider, MD  magnesium oxide (MAG-OX) 400 MG tablet Take 400 mg by mouth daily.    Yes Historical Provider, MD  metoprolol (LOPRESSOR) 50 MG tablet Take 100 mg by mouth 2 (two) times daily.    Yes Historical Provider, MD  oxyCODONE-acetaminophen (PERCOCET) 5-325 MG per tablet Take 1 tablet by mouth every 4 (four) hours as needed. For pain   Yes Historical Provider, MD  pantoprazole (PROTONIX) 40 MG tablet Take 40 mg by mouth daily.     Yes Historical Provider, MD  potassium chloride (KLOR-CON) 10 MEQ CR tablet Take 10 mEq by mouth 2 (two)  times daily.    Yes Historical Provider, MD  sucralfate (CARAFATE) 1 G tablet Take 1 g by mouth 3 (three) times daily.     Yes Historical Provider, MD  warfarin (COUMADIN) 2 MG tablet Take 2-3 mg by mouth at bedtime. Pt takes 1 & 1/2 tabs on m, Friday, and 1 tab all other days   Yes Historical Provider, MD    Inpatient Medications:     . aspirin  324 mg Oral Once  . DISCONTD: aspirin  325 mg Oral STAT    (Not in a hospital admission)  Family History  Problem Relation Age of Onset  . Colon cancer Neg Hx   . Thyroid disease Neg Hx   . Stroke Brother   . Kidney disease Brother   . Other Mother     Sepsis and perforated colon  . Other Father     Motor vehicle accident     History   Social History  . Marital Status: Widowed    Spouse Name: N/A    Number of Children: N/A  . Years of Education: N/A   Occupational History  . Not on file.   Social History Main Topics  . Smoking status: Former Smoker -- 0.2 packs/day    Quit date: 10/28/2005  . Smokeless tobacco: Never Used  . Alcohol Use: No  . Drug Use: No  . Sexually Active: Not on file   Other Topics Concern  . Not on file   Social History Narrative  . No narrative on file     Review of Systems: General:  negative for chills, fever, night sweats or weight changes.  Cardiovascular:  negative for chest pain, dyspnea on exertion, edema, orthopnea, palpitations, paroxysmal nocturnal dyspnea or shortness of breath Dermatological:  negative for rash Respiratory: negative for cough or wheezing Urologic: negative for hematuria Abdominal:  negative for nausea, vomiting, diarrhea, bright red blood per rectum, melena, or hematemesis Neurologic:  negative for visual changes, syncope, or dizziness All other systems reviewed and are otherwise negative except as noted above.  Physical Exam:  General:  Well developed, well nourished, in no acute distress. Head: Normocephalic, atraumatic, sclera non-icteric, no xanthomas,  nares are without discharge.  Neck:  Negative for carotid bruits. JVD 7 cm elevated. Lungs: Rales noted bibasilar, or rhonchi. Breathing is unlabored. Heart:  RRR with S1 S2. No murmurs, rubs, or gallops appreciated. Abdomen: Obese Soft, non-tender, non-distended with normoactive bowel sounds. No hepatomegaly. No rebound/guarding. No obvious abdominal masses. Msk:  Strength and tone appears normal for age. Extremities:  3 + edema.  Distal pedal pulses are 2+ and equal bilaterally. Neuro: Alert and oriented X 3. Moves all extremities spontaneously. Psych:   Responds to questions appropriately with a normal affect.  Labs: Recent Labs  Basename 11/23/11 1139   WBC 4.7   HGB 11.1*  HCT 33.9*   MCV 87.1   PLT 176   No results found for this basename: VITAMINB12,FOLATE,FERRITIN,TIBC,IRON,RETICCTPCT in the last 72 hours No results found for this basename: DDIMER:2 in the last 72 hours  Lab 11/23/11 1139  NA 140  K 4.2  CL 106  CO2 24  BUN 32*  CREATININE 2.14*  CALCIUM 9.3  PROT --  BILITOT --  ALKPHOS --  ALT --  AST --  AMYLASE --  LIPASE --  GLUCOSE 114*   No results found for this basename: HGBA1C in the last 72 hours Recent Labs  Basename 11/23/11 1437   CKTOTAL --   CKMB --   CKMBINDEX --   TROPONINI <0.30   No components found with this basename: POCBNP No results found for this basename: CHOL,HDL,LDLCALC,TRIG,CHOLHDL,LDLDIRECT in the last 72 hours No results found for this basename: TSH,T4TOTAL,FREET3,T3FREE,THYROIDAB in the last 72 hours  Radiology/Studies: Dg Chest 2 View  11/23/2011  *RADIOLOGY REPORT*  Clinical Data: Chest pain and shortness of breath.  History of coronary artery disease.  CHEST - 2 VIEW  Comparison: 07/19 and 02/27/2009  Findings: Heart size is normal.  Triple lead pacer in place. Chronic elevation of the right hemidiaphragm.  Pulmonary vascularity is normal and the lungs are clear.  No effusion.  IMPRESSION: No acute abnormalities.   Original Report Authenticated By: Gwynn Burly, M.D.   Dg Esophagus  10/26/2011  *RADIOLOGY REPORT*  Clinical Data: Dysphasia, reflux  ESOPHOGRAM/BARIUM SWALLOW  Technique:  Combined double contrast and single contrast examination performed using effervescent crystals, thick barium liquid, and thin barium liquid.  Fluoroscopy time:  1.26 minutes.  Comparison:  None.  Findings:  Normal oral phase of swallowing.  No laryngeal penetration or tracheobronchial aspiration.  Mildly diminished primary esophageal peristalsis.  No fixed esophageal narrowing or stricture.  A 13 mm barium tablet passed into the stomach without delay.  Small hiatal hernia.  The patient was unable to completely clear her esophagus in the prone position.  However, at least mild gastroesophageal reflux was visualized.  IMPRESSION: Mildly diminished primary esophageal peristalsis.  No fixed esophageal narrowing or stricture.  Small hiatal hernia.  Mild gastroesophageal reflux.  Original Report Authenticated By: Charline Bills, M.D.    ZOX:[id number] with BiV PPM  ASSESSMENT AND PLAN:  1. Acute/chronic systolic/diastolic heart failure 2. Atrial fib 3. S/p BiV ICD   Signed, R. Hurman Horn, PA-C 11/23/2011, 3:52 PM   Cardiology Attending  Patient seen and examined independently. I have reviewed his findiings with Mr. Shaune Spittle. Will admit and treat with IV lasix. Will rule out for MI which I doubt. Additional rec's to follow.

## 2011-11-23 NOTE — ED Notes (Signed)
Pt received to [facility] 9 with c/o chest discomfort onset 2-3 days ago with shortness of breath. Pt is A/A/Ox4, skin is warm and dry, respiration is even and unlabored.

## 2011-11-23 NOTE — ED Notes (Signed)
Pulse oximetry remained 96%-99% while ambulating.

## 2011-11-23 NOTE — ED Notes (Signed)
Patient with complaints of chest pain 2 days ago.  She states her sx are worse when she lays down.  Patient with no complaints of chest pain at present.  She states she did take a nitro today at 0800.  Patient has hx of hf.  Patient with some swelling in her ankles bil.  Patient lungs clear.  ekg with paced rhythm.

## 2011-11-23 NOTE — ED Provider Notes (Signed)
History     CSN: [id number]  Arrival date & time 11/23/11  1122   First MD Initiated Contact with Patient 11/23/11 1134      Chief Complaint  Patient presents with  . Shortness of Breath  . Chest Pain    (Consider location/radiation/quality/duration/timing/severity/associated sxs/prior treatment) HPI  77 year old female with history of atrial fibrillation on Coumadin, history of CAD with cardiac pacemaker, and also history of CHF presents with chief complaints of shortness of breath. Patient states for the past several days she has been having increased shortness of breath. Her shortness of breath is worse when she lays flat or when she exerts herself. She usually sleeps with one pillow but last night she has to sleep with 2 pillows. She also noticed increased swelling to the lower leg. Patient has cough that is nonproductive but denies fever, chills, or rash. Patient also complaining of intermittent bouts of chest pain. Pain is retrosternal, described as a achy sensation that is nonexertional. Pain usually lasting for seconds to minutes and resolve. States pain improves with nitroglycerin. She also experiencing some chest pain this morning but that has resolved. Patient states she has been watching her diet. She has also had been taking her medication as prescribed. She took 80mg  of Lasix this morning.   Past Medical History  Diagnosis Date  . Cardiac pacemaker in situ   . Other specified cardiac dysrhythmias     bradycardia  . Anemia, unspecified   . Cellulitis and abscess of unspecified site   . CAD (coronary artery disease)   . Edema   . Hypothyroid   . Renal insufficiency   . Hypertension   . Gastrointestinal hemorrhage   . GERD (gastroesophageal reflux disease)   . Depression   . CHF (congestive heart failure)   . A-fib   . Warfarin anticoagulation   . SSS (sick sinus syndrome)   . Anemia   . GI bleed     due to ulcers  . H/O: hysterectomy     Past Surgical History   Procedure Date  . Pacemaker insertion   . Esophagogastroduodenoscopy   . Coronary angioplasty     percutanous transluminal   . Cholecystectomy   . Appendectomy   . Cardiac cath 2006, 2008, 2009, 01/13/2011  . Lumbar disc surgery     Family History  Problem Relation Age of Onset  . Colon cancer Neg Hx   . Thyroid disease Neg Hx   . Stroke Brother   . Kidney disease Brother   . Other Mother     Sepsis and perforated colon  . Other Father     Motor vehicle accident    History  Substance Use Topics  . Smoking status: Former Smoker -- 0.2 packs/day    Quit date: 10/28/2005  . Smokeless tobacco: Never Used  . Alcohol Use: No    OB History    Grav Para Term Preterm Abortions TAB SAB Ect Mult Living                  Review of Systems  All other systems reviewed and are negative.    Allergies  Amiodarone  Home Medications   Current Outpatient Rx  Name Route Sig Dispense Refill  . ALPRAZOLAM 0.25 MG PO TABS Oral Take 0.25 mg by mouth 2 (two) times daily.    . CHLORDIAZEPOXIDE-AMITRIPTYLINE 5-12.5 MG PO TABS Oral Take 1 tablet by mouth daily.    Marland Kitchen AMLODIPINE BESYLATE 5 MG PO TABS Oral Take 5  mg by mouth daily.      . ASPIRIN 81 MG PO TABS Oral Take 81 mg by mouth daily.      Marland Kitchen FERROUS GLUCONATE 325 MG PO TABS Oral Take 325 mg by mouth daily with breakfast.     . FUROSEMIDE 80 MG PO TABS Oral Take 40 mg by mouth daily.     Marland Kitchen LOPERAMIDE HCL 2 MG PO CAPS Oral Take 2 mg by mouth daily as needed. For diarrhea    . LOVASTATIN 10 MG PO TABS Oral Take 10 mg by mouth at bedtime.      Marland Kitchen MAGNESIUM OXIDE 400 MG PO TABS Oral Take 400 mg by mouth daily.     Marland Kitchen METOPROLOL TARTRATE 50 MG PO TABS Oral Take 100 mg by mouth 2 (two) times daily.     . OXYCODONE-ACETAMINOPHEN 5-325 MG PO TABS Oral Take 1 tablet by mouth every 4 (four) hours as needed. For pain    . PANTOPRAZOLE SODIUM 40 MG PO TBEC Oral Take 40 mg by mouth daily.      Marland Kitchen POTASSIUM CHLORIDE 10 MEQ PO TBCR Oral Take 10 mEq  by mouth 2 (two) times daily.     . SUCRALFATE 1 G PO TABS Oral Take 1 g by mouth 3 (three) times daily.      . WARFARIN SODIUM 2 MG PO TABS Oral Take 2-3 mg by mouth at bedtime. Pt takes 1 & 1/2 tabs on m, Friday, and 1 tab all other days      BP 148/78  Pulse 63  Temp(Src) 97.7 F (36.5 C) (Oral)  Resp 12  SpO2 99%  Physical Exam  Constitutional: She is oriented to person, place, and time. She appears well-developed and well-nourished.       In no acute respiratory distress, appeared nontoxic, is alert and oriented.  HENT:  Head: Normocephalic and atraumatic.  Mouth/Throat: Oropharynx is clear and moist. No oropharyngeal exudate.  Eyes: Conjunctivae are normal.  Neck: Neck supple.  Cardiovascular: Normal rate and regular rhythm.  Exam reveals no gallop and no friction rub.   No murmur heard. Pulmonary/Chest: Effort normal and breath sounds normal. No respiratory distress. She has no wheezes. She has no rales. She exhibits no tenderness.  Abdominal: Soft. There is no tenderness.  Musculoskeletal: She exhibits edema.       bilateral 1+ pitting edema to lower extremity.  Palpable pedal pulses, brisk cap refills.    Lymphadenopathy:    She has no cervical adenopathy.  Neurological: She is alert and oriented to person, place, and time.  Skin: Skin is warm.    ED Course  Procedures (including critical care time)  Labs Reviewed  CBC - Abnormal; Notable for the following:    Hemoglobin 11.1 (*)    HCT 33.9 (*)    RDW 15.8 (*)    All other components within normal limits  BASIC METABOLIC PANEL - Abnormal; Notable for the following:    Glucose, Bld 114 (*)    BUN 32 (*)    Creatinine, Ser 2.14 (*)    GFR calc non Af Amer 21 (*)    GFR calc Af Amer 25 (*)    All other components within normal limits  PRO B NATRIURETIC PEPTIDE - Abnormal; Notable for the following:    Pro B Natriuretic peptide (BNP) 4964.0 (*)    All other components within normal limits  PROTIME-INR -  Abnormal; Notable for the following:    Prothrombin Time 36.0 (*)  INR 3.54 (*)    All other components within normal limits  POCT I-STAT TROPONIN I  URINALYSIS, ROUTINE W REFLEX MICROSCOPIC   Dg Chest 2 View  11/23/2011  *RADIOLOGY REPORT*  Clinical Data: Chest pain and shortness of breath.  History of coronary artery disease.  CHEST - 2 VIEW  Comparison: 07/19 and 02/27/2009  Findings: Heart size is normal.  Triple lead pacer in place. Chronic elevation of the right hemidiaphragm.  Pulmonary vascularity is normal and the lungs are clear.  No effusion.  IMPRESSION: No acute abnormalities.  Original Report Authenticated By: Gwynn Burly, M.D.     No diagnosis found.  Results for orders placed during the hospital encounter of 11/23/11  CBC      Component Value Range   WBC 4.7  4.0 - 10.5 (K/uL)   RBC 3.89  3.87 - 5.11 (MIL/uL)   Hemoglobin 11.1 (*) 12.0 - 15.0 (g/dL)   HCT 40.9 (*) 81.1 - 46.0 (%)   MCV 87.1  78.0 - 100.0 (fL)   MCH 28.5  26.0 - 34.0 (pg)   MCHC 32.7  30.0 - 36.0 (g/dL)   RDW 91.4 (*) 78.2 - 15.5 (%)   Platelets 176  150 - 400 (K/uL)  BASIC METABOLIC PANEL      Component Value Range   Sodium 140  135 - 145 (mEq/L)   Potassium 4.2  3.5 - 5.1 (mEq/L)   Chloride 106  96 - 112 (mEq/L)   CO2 24  19 - 32 (mEq/L)   Glucose, Bld 114 (*) 70 - 99 (mg/dL)   BUN 32 (*) 6 - 23 (mg/dL)   Creatinine, Ser 9.56 (*) 0.50 - 1.10 (mg/dL)   Calcium 9.3  8.4 - 21.3 (mg/dL)   GFR calc non Af Amer 21 (*) >90 (mL/min)   GFR calc Af Amer 25 (*) >90 (mL/min)  PRO B NATRIURETIC PEPTIDE      Component Value Range   Pro B Natriuretic peptide (BNP) 4964.0 (*) 0 - 450 (pg/mL)  PROTIME-INR      Component Value Range   Prothrombin Time 36.0 (*) 11.6 - 15.2 (seconds)   INR 3.54 (*) 0.00 - 1.49   URINALYSIS, ROUTINE W REFLEX MICROSCOPIC      Component Value Range   Color, Urine YELLOW  YELLOW    APPearance CLEAR  CLEAR    Specific Gravity, Urine 1.008  1.005 - 1.030    pH 5.5  5.0  - 8.0    Glucose, UA NEGATIVE  NEGATIVE (mg/dL)   Hgb urine dipstick TRACE (*) NEGATIVE    Bilirubin Urine NEGATIVE  NEGATIVE    Ketones, ur NEGATIVE  NEGATIVE (mg/dL)   Protein, ur NEGATIVE  NEGATIVE (mg/dL)   Urobilinogen, UA 0.2  0.0 - 1.0 (mg/dL)   Nitrite NEGATIVE  NEGATIVE    Leukocytes, UA NEGATIVE  NEGATIVE   POCT I-STAT TROPONIN I      Component Value Range   Troponin i, poc 0.01  0.00 - 0.08 (ng/mL)   Comment 3           URINE MICROSCOPIC-ADD ON      Component Value Range   Squamous Epithelial / LPF RARE  RARE    WBC, UA 0-2  <3 (WBC/hpf)   RBC / HPF 3-6  <3 (RBC/hpf)   Bacteria, UA RARE  RARE    Dg Chest 2 View  11/23/2011  *RADIOLOGY REPORT*  Clinical Data: Chest pain and shortness of breath.  History of coronary artery  disease.  CHEST - 2 VIEW  Comparison: 07/19 and 02/27/2009  Findings: Heart size is normal.  Triple lead pacer in place. Chronic elevation of the right hemidiaphragm.  Pulmonary vascularity is normal and the lungs are clear.  No effusion.  IMPRESSION: No acute abnormalities.  Original Report Authenticated By: Gwynn Burly, M.D.    Date: 11/23/2011  Rate: 74  Rhythm: atrial fibrillation  QRS Axis: left  Intervals: V-paced complexes  ST/T Wave abnormalities: paced rhythm  Conduction Disutrbances:paced rhythm  Narrative Interpretation:   Old EKG Reviewed: unchanged     MDM  Patient symptoms is likely secondary to congestive heart failure. Her pro BNP is 4964 today, but her CXR shows no obvious signs of effusion. We do not have any recent BNP to compare with.  Last documented BNP was 662 four years ago. Lower extremities are mildly edematous but no frank edema noted.       ECG shows no acute changes and troponin is negative.  She is supratherapeutic with INR of 3.54.  She will need to hold her coumadin dose today. Her BUN and Creatinine is at her baseline.  Pt has gain 5 lbs in 4-5 days.  Care discussed with my attending. Will obtain delta troponin  as pt experienced cp last night.      2:59 PM Patient maintains good oxygenation with ambulation.  No acute resp distress.  CP free.  No changes in delta troponin. ECG shows afib and paced rhythm.  My attending has consulted with cardiology, who agrees to see pt in ED for further evaluation.    Fayrene Helper, PA-C 11/23/11 1544  Fayrene Helper, PA-C 11/23/11 1545

## 2011-11-23 NOTE — Progress Notes (Signed)
ANTICOAGULATION CONSULT NOTE - Initial Consult  Pharmacy Consult for coumadin Indication: atrial fibrillation  Allergies  Allergen Reactions  . Amiodarone Hives  . Propylthiouracil Rash    Patient Measurements: Weight: 207 lb (93.895 kg)  Vital Signs: Temp: 97.7 F (36.5 C) (04/10 1133) Temp src: Oral (04/10 1133) BP: 139/67 mmHg (04/10 1555) Pulse Rate: 62  (04/10 1555)  Labs:  Basename 11/23/11 1437 11/23/11 1155 11/23/11 1139  HGB -- -- 11.1*  HCT -- -- 33.9*  PLT -- -- 176  APTT -- -- --  LABPROT -- 36.0* --  INR -- 3.54* --  HEPARINUNFRC -- -- --  CREATININE -- -- 2.14*  CKTOTAL -- -- --  CKMB -- -- --  TROPONINI <0.30 -- --   The CrCl is unknown because both a height and weight (above a minimum accepted value) are required for this calculation.  Medical History: Past Medical History  Diagnosis Date  . Cardiac pacemaker in situ   . Other specified cardiac dysrhythmias     bradycardia  . Anemia, unspecified   . Cellulitis and abscess of unspecified site   . CAD (coronary artery disease)   . Edema   . Hypothyroid   . Renal insufficiency   . Hypertension   . Gastrointestinal hemorrhage   . GERD (gastroesophageal reflux disease)   . Depression   . CHF (congestive heart failure)   . A-fib   . Warfarin anticoagulation   . SSS (sick sinus syndrome)   . Anemia   . GI bleed     due to ulcers  . H/O: hysterectomy    Assessment: 55 yof on chronic coumadin therapy presented to the ED with complaints of chest pain and shortness of breath. Pt has a significant cardiac history and also noted history of anemia and GIB. INR today is supratherapeutic but no bleeding noted. Pt is also anemia but platelets are WNL.   Goal of Therapy:  INR 2-3   Plan:  1. No coumadin today 2. Daily INR  Maxie Slovacek, Drake Leach 11/23/2011,4:48 PM

## 2011-11-24 ENCOUNTER — Encounter (HOSPITAL_COMMUNITY): Payer: Self-pay | Admitting: General Practice

## 2011-11-24 DIAGNOSIS — I059 Rheumatic mitral valve disease, unspecified: Secondary | ICD-10-CM

## 2011-11-24 DIAGNOSIS — I509 Heart failure, unspecified: Secondary | ICD-10-CM

## 2011-11-24 LAB — CBC
HCT: 32.4 % — ABNORMAL LOW (ref 36.0–46.0)
Hemoglobin: 10.5 g/dL — ABNORMAL LOW (ref 12.0–15.0)
MCH: 27.9 pg (ref 26.0–34.0)
MCHC: 32.4 g/dL (ref 30.0–36.0)
MCV: 86.2 fL (ref 78.0–100.0)

## 2011-11-24 LAB — BASIC METABOLIC PANEL
BUN: 35 mg/dL — ABNORMAL HIGH (ref 6–23)
Calcium: 8.9 mg/dL (ref 8.4–10.5)
Creatinine, Ser: 2.41 mg/dL — ABNORMAL HIGH (ref 0.50–1.10)
GFR calc non Af Amer: 19 mL/min — ABNORMAL LOW (ref >90)
Glucose, Bld: 96 mg/dL (ref 70–99)
Potassium: 3.5 mEq/L (ref 3.5–5.1)

## 2011-11-24 LAB — CARDIAC PANEL(CRET KIN+CKTOT+MB+TROPI)
CK, MB: 1.7 ng/mL (ref 0.3–4.0)
Troponin I: <0.3 ng/mL (ref <0.30)
Troponin I: <0.3 ng/mL (ref <0.30)

## 2011-11-24 LAB — PROTIME-INR: INR: 3.72 — ABNORMAL HIGH (ref 0.00–1.49)

## 2011-11-24 MED ORDER — AMITRIPTYLINE HCL 25 MG PO TABS
12.5000 mg | ORAL_TABLET | Freq: Every day | ORAL | Status: DC
  Administered 2011-11-24 – 2011-11-25 (×2): 12.5 mg via ORAL
  Filled 2011-11-24 (×2): qty 0.5

## 2011-11-24 MED ORDER — CHLORDIAZEPOXIDE HCL 5 MG PO CAPS
5.0000 mg | ORAL_CAPSULE | Freq: Every day | ORAL | Status: DC
  Administered 2011-11-24 – 2011-11-25 (×2): 5 mg via ORAL
  Filled 2011-11-24 (×2): qty 1

## 2011-11-24 NOTE — Progress Notes (Signed)
Patient ID: [name], female   DOB: May 10, 1935, 77 y.o.   MRN: [id number]    Subjective:  Denies SSCP, palpitations or Dyspnea   Objective:  Filed Vitals:   11/23/11 1745 11/23/11 2220 11/24/11 0235 11/24/11 0500  BP: 166/81 151/74 125/66   Pulse: 64 61 60   Temp: 97.6 F (36.4 C) 97.2 F (36.2 C) 98 F (36.7 C)   TempSrc: Oral Oral Oral   Resp: 18 18 18    Height: 5\' 9"  (1.753 m)     Weight: 203 lb 11.3 oz (92.4 kg)   201 lb 4.5 oz (91.3 kg)  SpO2: 100% 98% 95%     Intake/Output from previous day:  Intake/Output Summary (Last 24 hours) at 11/24/11 0824 Last data filed at 11/24/11 0816  Gross per 24 hour  Intake    480 ml  Output   2050 ml  Net  -1570 ml    Physical Exam: Affect appropriate Healthy:  appears stated age HEENT: normal Neck supple with no adenopathy JVP normal no bruits no thyromegaly Lungs clear with no wheezing and good diaphragmatic motion Heart:  S1/S2 no murmur, no rub, gallop or click PMI normal Abdomen: benighn, BS positve, no tenderness, no AAA no bruit.  No HSM or HJR Distal pulses intact with no bruits No edema Neuro non-focal Skin warm and dry No muscular weakness Pacer under left clavicle   Lab Results: Basic Metabolic Panel:  Basename 11/24/11 0311 11/23/11 1139  NA 140 140  K 3.5 4.2  CL 106 106  CO2 25 24  GLUCOSE 96 114*  BUN 35* 32*  CREATININE 2.41* 2.14*  CALCIUM 8.9 9.3  MG -- --  PHOS -- --     Basename 11/24/11 0311 11/23/11 1139  WBC 4.5 4.7  NEUTROABS -- --  HGB 10.5* 11.1*  HCT 32.4* 33.9*  MCV 86.2 87.1  PLT 152 176   Cardiac Enzymes:  Basename 11/24/11 0312 11/23/11 2024 11/23/11 1437  CKTOTAL 42 59 --  CKMB 1.7 1.9 --  CKMBINDEX -- -- --  TROPONINI <0.30 <0.30 <0.30    Imaging: Dg Chest 2 View  11/23/2011  *RADIOLOGY REPORT*  Clinical Data: Chest pain and shortness of breath.  History of coronary artery disease.  CHEST - 2 VIEW  Comparison: 07/19 and 02/27/2009  Findings: Heart  size is normal.  Triple lead pacer in place. Chronic elevation of the right hemidiaphragm.  Pulmonary vascularity is normal and the lungs are clear.  No effusion.  IMPRESSION: No acute abnormalities.  Original Report Authenticated By: Gwynn Burly, M.D.    Cardiac Studies:    Telemetry: afib v pacing  Echo:   Medications:     . ALPRAZolam  0.25 mg Oral BID  . amLODipine  5 mg Oral Daily  . aspirin  324 mg Oral Once  . aspirin EC  81 mg Oral Daily  . chloridazePOXIDE-amitriptyline  1 tablet Oral Daily  . ferrous gluconate  325 mg Oral Q breakfast  . furosemide  80 mg Intravenous BID  . magnesium oxide  400 mg Oral Daily  . metoprolol  100 mg Oral BID  . pantoprazole  40 mg Oral Q1200  . potassium chloride  40 mEq Oral BID  . simvastatin  5 mg Oral q1800  . sodium chloride  3 mL Intravenous Q12H  . sucralfate  1 g Oral TID  . Warfarin - Pharmacist Dosing Inpatient   Does not apply q1800  . DISCONTD: aspirin  325 mg Oral  STAT       Assessment/Plan:  CAD:  R/O no pain this am  Atypical pain no indication for cath especially with renal function and Rx INR CHF:  Improved.  Will have to watch prerenal azotemia Continue iv lasix today and change to PO in am Afib:  S/P AV node ablation for rate and AICD backup INR high No coumadin today.  Charlton Haws 11/24/2011, 8:24 AM

## 2011-11-24 NOTE — Plan of Care (Signed)
Problem: Phase I Progression Outcomes Goal: EF % per last Echo/documented,Core Reminder form on chart Outcome: Completed/Met Date Met:  11/24/11 55-60(11/2011)

## 2011-11-24 NOTE — Progress Notes (Signed)
INITIAL ADULT NUTRITION ASSESSMENT Date: 11/24/2011   Time: 10:00 AM Reason for Assessment: Nutrition risk, dysphagia  ASSESSMENT: Female 77 y.o.  Dx: CHF  Hx:  Past Medical History  Diagnosis Date  . Cardiac pacemaker in situ   . Cellulitis and abscess of unspecified site   . CAD (coronary artery disease)   . Edema   . Renal insufficiency   . Hypertension   . Gastrointestinal hemorrhage   . GERD (gastroesophageal reflux disease)   . Depression   . CHF (congestive heart failure)   . Warfarin anticoagulation   . GI bleed     due to ulcers  . Other specified cardiac dysrhythmias     bradycardia  . SSS (sick sinus syndrome)   . Atrial fibrillation with rapid ventricular response     /e-chart 10/2004  . Polycystic kidney disease     /E-chart  . Peripheral vascular disease   . Heart murmur   . Myocardial infarction 1996; ?2006  . Angina   . Shortness of breath on exertion   . Hyperthyroidism   . Anemia, unspecified   . Blood transfusion   . H/O hiatal hernia   . Umbilical hernia     unrepaired (11/24/11)  . Migraines 11/24/11    "I've always had them"  . Arthritis   . Gout     "get it in my feet"    Related Meds:     . ALPRAZolam  0.25 mg Oral BID  . chlordiazePOXIDE  5 mg Oral Daily   And  . amitriptyline  12.5 mg Oral Daily  . amLODipine  5 mg Oral Daily  . aspirin  324 mg Oral Once  . aspirin EC  81 mg Oral Daily  . ferrous gluconate  325 mg Oral Q breakfast  . furosemide  80 mg Intravenous BID  . magnesium oxide  400 mg Oral Daily  . metoprolol  100 mg Oral BID  . pantoprazole  40 mg Oral Q1200  . potassium chloride  40 mEq Oral BID  . simvastatin  5 mg Oral q1800  . sodium chloride  3 mL Intravenous Q12H  . sucralfate  1 g Oral TID  . Warfarin - Pharmacist Dosing Inpatient   Does not apply q1800  . DISCONTD: aspirin  325 mg Oral STAT  . DISCONTD: chloridazePOXIDE-amitriptyline  1 tablet Oral Daily     Ht: 5\' 9"  (175.3 cm)  Wt: 201 lb 4.5 oz  (91.3 kg) (b scale)  Ideal Wt: 65.9 kg % Ideal Wt: 139%  Usual Wt: ~190 lbs (dry wt per pt report) Wt Readings from Last 3 Encounters:  11/24/11 201 lb 4.5 oz (91.3 kg)  10/30/11 199 lb 8.3 oz (90.5 kg)  04/14/11 193 lb (87.544 kg)   % Usual Wt: 105%  Body mass index is 29.72 kg/(m^2). Overweight  Food/Nutrition Related Hx: Recently with decreased appetite. Hx of dysphagia, see in IR 3/13. Eats small bites slowly at home.   Labs:  CMP     Component Value Date/Time   NA 140 11/24/2011 0311   K 3.5 11/24/2011 0311   CL 106 11/24/2011 0311   CO2 25 11/24/2011 0311   GLUCOSE 96 11/24/2011 0311   BUN 35* 11/24/2011 0311   CREATININE 2.41* 11/24/2011 0311   CALCIUM 8.9 11/24/2011 0311   CALCIUM 8.7 03/02/2007 0505   PROT 7.6 08/03/2009 1028   ALBUMIN 3.7 08/03/2009 1028   AST 26 08/03/2009 1028   ALT 20 08/03/2009 1028   ALKPHOS  80 08/03/2009 1028   BILITOT 0.7 08/03/2009 1028   GFRNONAA 19* 11/24/2011 0311   GFRAA 21* 11/24/2011 0311     Intake/Output Summary (Last 24 hours) at 11/24/11 1003 Last data filed at 11/24/11 0943  Gross per 24 hour  Intake    603 ml  Output   2350 ml  Net  -1747 ml     Diet Order: Cardiac, 2 gm sodium, 1.5 L fluid restriction  Supplements/Tube Feeding: none  IVF:    Estimated Nutritional Needs:   Kcal: 1500-1700  Protein: 75-85 gm Fluid:  1.5 L per restrictions  Pt states that she manages her problems swallowing well at home. Denies need for dysphagia diet.   NUTRITION DIAGNOSIS: -Swallowing difficulty (NI-1.1).  Status: Ongoing  RELATED TO: reflux esophagitis   AS EVIDENCE BY: per IR notes   MONITORING/EVALUATION(Goals): Goal: Po intake to meet needs Monitor: PO intake, weight, labs, I/O's  EDUCATION NEEDS: -No education needs identified at this time  INTERVENTION: 1. No nutrition interventions at this time, denies need for texture modification of diet.   Dietitian [phone]  DOCUMENTATION CODES Per approved criteria    -Not Applicable    Clarene Duke MARIE 11/24/2011, 10:00 AM

## 2011-11-24 NOTE — Progress Notes (Signed)
  Echocardiogram 2D Echocardiogram has been performed.  Shea Swalley L 11/24/2011, 10:58 AM

## 2011-11-24 NOTE — Clinical Documentation Improvement (Signed)
CKD DOCUMENTATION CLARIFICATION QUERY   THIS DOCUMENT IS NOT A PERMANENT PART OF THE MEDICAL RECORD  Please update your documentation within the medical record to reflect your response to this query.                                                                                         11/24/11   Dr. [name] and/or Associates,  In a better effort to capture your patient's severity of illness, reflect appropriate length of stay and utilization of resources, a review of the patient medical record has revealed the following indicators:  "Renal insufficiency" documented in H&P  R. Hurman Horn, PA-C  11/23/2011, 3:52 PM   Bun/Cr/GFR      (wf) 36/2.1  /24       01/06/2011 17/1.87/27       01/12/2011 24/2.01/23       10/29/2011 26/2.03/23       10/30/2011 30/2.18/21       10/31/2011 32/2.14/21       11/23/2011 35/2.41/19       11/24/2011    Based on your clinical judgment, please document in the progress notes and discharge summary if a condition below provides greater specificity regarding the patient's renal function:  CKD Stage I -  GFR > OR = 90 CKD Stage II - GFR 60-80 CKD Stage III - GFR 30-59 CKD Stage IV - GFR 15-29 CKD Stage V - GFR < 15 ESRD (End Stage Renal Disease) Other Condition_____________ Unable to Clinically Determine    In responding to this query please exercise your independent judgment.  The fact that a query is asked, does not imply that any particular answer is desired or expected.  Reviewed:  no additional documentation provided  Thank You,  Jerral Ralph  RN BSN Certified Clinical Documentation Specialist: Cell   [phone]  Health Information Management [place]  TO RESPOND TO THE THIS QUERY, FOLLOW THE INSTRUCTIONS BELOW:  1. If needed, update documentation for the patient's encounter via the notes activity.  2. Access this query again and click edit on the In [employer].  3. After updating, or not, click F2 to complete all  highlighted (required) fields concerning your review. Select "additional documentation in the medical record" OR "no additional documentation provided".  4. Click Sign note button.  5. The deficiency will fall out of your In Basket *Please let us know if you are not able to complete this workflow by phone or e-mail (listed below).

## 2011-11-24 NOTE — ED Provider Notes (Signed)
Medical screening examination/treatment/procedure(s) were conducted as a shared visit with non-physician practitioner(s) and myself.  I personally evaluated the patient during the encounter  Discussed with cardiology who will admit for observation.  Cyndra Numbers, MD 11/24/11 1328

## 2011-11-24 NOTE — Progress Notes (Signed)
ANTICOAGULATION CONSULT NOTE - Follow-up Consult  Pharmacy Consult for coumadin Indication: atrial fibrillation  Allergies  Allergen Reactions  . Amiodarone Hives  . Propylthiouracil Rash    Patient Measurements: Height: 5\' 9"  (175.3 cm) Weight: 201 lb 4.5 oz (91.3 kg) (b scale) IBW/kg (Calculated) : 66.2   Vital Signs: Temp: 98 F (36.7 C) (04/11 0235) Temp src: Oral (04/11 0235) BP: 138/68 mmHg (04/11 0941) Pulse Rate: 64  (04/11 0941)  Labs:  Basename 11/24/11 1130 11/24/11 0312 11/24/11 0311 11/23/11 2024 11/23/11 1155 11/23/11 1139  HGB -- -- 10.5* -- -- 11.1*  HCT -- -- 32.4* -- -- 33.9*  PLT -- -- 152 -- -- 176  APTT -- -- -- -- -- --  LABPROT -- -- 37.4* -- 36.0* --  INR -- -- 3.72* -- 3.54* --  HEPARINUNFRC -- -- -- -- -- --  CREATININE -- -- 2.41* -- -- 2.14*  CKTOTAL 50 42 -- 59 -- --  CKMB 1.6 1.7 -- 1.9 -- --  TROPONINI <0.30 <0.30 -- <0.30 -- --   Estimated Creatinine Clearance: 24.3 ml/min (by C-G formula based on Cr of 2.41).   Assessment: 77 yo F on coumadin PTA for hx Afib.  INR remains supratherapeutic.  No bleeding noted.  Will continue to hold dose.   Goal of Therapy:  INR 2-3   Plan:  1. No coumadin today 2. Daily INR  [employer], Pharm.D., BCPS Clinical Pharmacist Pager [phone] 11/24/2011 1:05 PM

## 2011-11-25 DIAGNOSIS — N179 Acute kidney failure, unspecified: Secondary | ICD-10-CM

## 2011-11-25 DIAGNOSIS — I5033 Acute on chronic diastolic (congestive) heart failure: Secondary | ICD-10-CM

## 2011-11-25 DIAGNOSIS — I4891 Unspecified atrial fibrillation: Secondary | ICD-10-CM

## 2011-11-25 DIAGNOSIS — R079 Chest pain, unspecified: Secondary | ICD-10-CM

## 2011-11-25 LAB — CBC
Platelets: 177 10*3/uL (ref 150–400)
RBC: 3.9 MIL/uL (ref 3.87–5.11)
WBC: 4.7 10*3/uL (ref 4.0–10.5)

## 2011-11-25 LAB — PROTIME-INR: INR: 2.09 — ABNORMAL HIGH (ref 0.00–1.49)

## 2011-11-25 LAB — BASIC METABOLIC PANEL
CO2: 25 mEq/L (ref 19–32)
Calcium: 9.1 mg/dL (ref 8.4–10.5)
GFR calc non Af Amer: 19 mL/min — ABNORMAL LOW (ref >90)
Sodium: 139 mEq/L (ref 135–145)

## 2011-11-25 MED ORDER — WARFARIN SODIUM 3 MG PO TABS
3.0000 mg | ORAL_TABLET | Freq: Once | ORAL | Status: DC
  Filled 2011-11-25: qty 1

## 2011-11-25 MED ORDER — FERROUS GLUCONATE 324 (38 FE) MG PO TABS
324.0000 mg | ORAL_TABLET | Freq: Every day | ORAL | Status: DC
  Administered 2011-11-25: 324 mg via ORAL
  Filled 2011-11-25 (×2): qty 1

## 2011-11-25 NOTE — Discharge Summary (Signed)
Discharge Summary   Patient ID: [name],  MRN: [id number], DOB/AGE: 1935-05-08 77 y.o.  Admit date: 11/23/2011 Discharge date: 11/25/2011  Discharge Diagnoses Principal Problem:  *Acute on chronic combined systolic and diastolic congestive heart failure Active Problems:  Atrial fibrillation  Chest pain   Allergies Allergies  Allergen Reactions  . Amiodarone Hives  . Propylthiouracil Rash    Diagnostic Studies/Procedures  CHEST - 2 VIEW- 11/23/11  Comparison: 07/19 and 02/27/2009  Findings: Heart size is normal. Triple lead pacer in place.  Chronic elevation of the right hemidiaphragm. Pulmonary  vascularity is normal and the lungs are clear. No effusion.  IMPRESSION:  No acute abnormalities.   2D ECHOCARDIOGRAM WITH CONTRAST- 11/24/11  Study Conclusions  - Left ventricle: The cavity size was normal. Wall thickness was normal. Systolic function was normal. The estimated ejection fraction was in the range of 55% to 60%. - Mitral valve: Mild to moderate regurgitation. - Left atrium: The atrium was moderately dilated. - Right ventricle: The cavity size was mildly dilated. - Right atrium: The atrium was mildly dilated. - Atrial septum: No defect or patent foramen ovale was identified. - Pulmonary arteries: PA peak pressure: 39mm Hg (S).  ------------------------------------------------------------ Labs, prior tests, procedures, and surgery: Permanent pacemaker system implantation.  Transthoracic echocardiography. M-mode, complete 2D, spectral Doppler, and color Doppler. Height: Height: 175.3cm. Height: 69in. Weight: Weight: 91.3kg. Weight: 200.9lb. Body mass index: BMI: 29.7kg/m^2. Body surface area: BSA: 2.58m^2. Blood pressure: 138/68. Patient status: Inpatient. Location: Echo laboratory.  ------------------------------------------------------------  ------------------------------------------------------------ Left ventricle: The cavity size was  normal. Wall thickness was normal. Systolic function was normal. The estimated ejection fraction was in the range of 55% to 60%.  ------------------------------------------------------------ Aortic valve: Mildly calcified leaflets.  ------------------------------------------------------------ Aorta: The aorta was normal, not dilated, and non-diseased.  ------------------------------------------------------------ Mitral valve: Mildly thickened leaflets . Doppler: Mild to moderate regurgitation. Peak gradient: 4mm Hg (D).  ------------------------------------------------------------ Left atrium: The atrium was moderately dilated.  ------------------------------------------------------------ Atrial septum: No defect or patent foramen ovale was identified.  ------------------------------------------------------------ Right ventricle: The cavity size was mildly dilated. Pacer wire or catheter noted in right ventricle.  ------------------------------------------------------------ Pulmonic valve: Doppler: Trivial regurgitation.  ------------------------------------------------------------ Tricuspid valve: Doppler: Mild regurgitation.  ------------------------------------------------------------ Right atrium: The atrium was mildly dilated.  ------------------------------------------------------------ Pericardium: The pericardium was normal in appearance.  ------------------------------------------------------------ Post procedure conclusions Ascending Aorta:  - The aorta was normal, not dilated, and non-diseased.  ------------------------------------------------------------  2D measurements Normal Doppler Normal Left ventricle measurements LVID ED, 50 mm 43-52 Main pulmonary chord, artery PLAX Pressure, S 39 mm =30 LVID ES, 39 mm 23-38 Hg chord, Left ventricle PLAX Ea, lat 8.68 cm/ ------- FS, chord, 22 % >29 ann, tiss s PLAX DP LVPW, ED 11 mm ------ E/Ea, lat 12.21  ------- IVS/LVPW 0.91 <1.3 ann, tiss ratio, ED DP Ventricular septum Ea, med 5.17 cm/ ------- IVS, ED 10 mm ------ ann, tiss s Aorta DP Root diam, 32 mm ------ E/Ea, med 20.5 ------- ED ann, tiss Left atrium DP AP dim 49 mm ------ Mitral valve AP dim 2.3 cm/m^2 <2.2 Peak E vel 106 cm/ ------- index s Deceleratio 211 ms 150-230 n time Peak 4 mm ------- gradient, D Hg Tricuspid valve Regurg peak 269 cm/ ------- vel s Peak RV-RA 29 mm ------- gradient, S Hg Systemic veins Estimated 10 mm ------- CVP Hg Right ventricle Pressure, S 39 mm <30 Hg Sa vel, lat 13.4 cm/ ------- ann, tiss s DP  History of Present Illness  Ms. Matarazzo is  a 77yo female with PMHx significant for CAD (s/p DES-LCx), symptomatic bradycardia (SSS noted on history s/p AVN ablation, [employer] SENSIA Bi-V ICD implantation, upgraded 2008), permanent atrial fibrillation (on chronic Coumadin anticoagulation), HTN, CKD and GERD who presents to [facility] ED today with complaints of chest pain and shortness of breath.   She presented to [facility] ED on 04/10 with complaints of increased shortness of breath and wheezing with associated increased LE swelling. She did endorse intermittent chest pain over the last few weeks described as burning. She stated that she always becomes short of breath with chest pain, but does not always have chest pain when she is short of breath. She denied diaphoresis, lightheadedness, cough, n/v/d, fevers, chills or sick contacts at home.   In the ED, pBNP was elevated and CXR showed no acute abnormalities. EKG revealed appropriate V-pacing without evidence of ischemic. POC troponin was WNL. INR was found to be supratherapeutic. Baseline renal dysfunction was noted (stage 4). She was subsequently admitted for acute on chronic mixed CHF with plans for IV diuresis.   Hospital Course   She was continued on prior medications excluding Coumadin to allow INR to fall and Lasix PO. Potassium was adequately  supplemented throughout the admission. She diuresed well, and her dyspnea improved the following day and she denied chest pain. There was no evidence of acute abnormalities overnight on telemetry. A 2D echocardiogram was performed as detailed above.  Cardiac biomarkers returned normal x 3 (troponin WNL x 4). Given her lack of objective ischemic evidence, alleviation of symptoms and CKD, the patient did not undergo diagnostic cardiac catheterization. She continued on Lasix IV with tapering output and a mild elevation in Cr.   Today, she is stable, at baseline and will be discharged home. There were no overnight events. Her symptoms improved with a net total output of -3700 cc. She will resume all prior outpatient medications including Lasix PO, KCl and Coumadin. She will follow-up in the office next week and attend all previously-scheduled follow-up appointments. This information has been clearly outlined in the discharge AVS.   Discharge Vitals:  Blood pressure 148/81, pulse 59, temperature 97.2 F (36.2 C), temperature source Oral, resp. rate 20, height 5\' 9"  (1.753 m), weight 91.3 kg (201 lb 4.5 oz), SpO2 97.00%.   Labs: Recent Labs  Basename 11/24/11 0311 11/23/11 1139   WBC 4.5 4.7   HGB 10.5* 11.1*   HCT 32.4* 33.9*   MCV 86.2 87.1   PLT 152 176    Lab 11/24/11 0311 11/23/11 1139  NA 140 140  K 3.5 4.2  CL 106 106  CO2 25 24  BUN 35* 32*  CREATININE 2.41* 2.14*  CALCIUM 8.9 9.3  PROT -- --  BILITOT -- --  ALKPHOS -- --  ALT -- --  AST -- --  AMYLASE -- --  LIPASE -- --  GLUCOSE 96 114*   Recent Labs  Basename 11/24/11 1130 11/24/11 0312 11/23/11 2024   CKTOTAL 50 42 59   CKMB 1.6 1.7 1.9   CKMBINDEX -- -- --   TROPONINI <0.30 <0.30 <0.30   Disposition:  Discharge Orders    Future Appointments: Provider: Department: Dept Phone: Center:   11/30/2011 10:30 AM Dyann Kief, PA [facility] [phone] [facility]   12/08/2011 11:00 AM Marinus Maw, MD  [facility] [phone] [facility]   12/15/2011 11:30 AM Wendall Stade, MD [facility] [phone] [facility]     Follow-up Information    Follow up  with Jacolyn Reedy, PA on 11/30/2011. (As scheduled )    Contact information:   [address] [place] [address] [phone]       Follow up with Lewayne Bunting, MD on 12/08/2011. (At 11:00 AM for ICD follow-up)    Contact information:   [address] [place] [address] [phone]       Follow up with Charlton Haws, MD on 12/15/2011. (At 11:30 as scheduled. )    Contact information:   [address], [address] [place] [address] [phone]         Discharge Medications:  Medication List  As of 11/25/2011 10:44 AM   CONTINUE taking these medications         ALPRAZolam 0.25 MG tablet   Commonly known as: XANAX      amLODipine 5 MG tablet   Commonly known as: NORVASC      aspirin 81 MG tablet      chloridazePOXIDE-amitriptyline 5-12.5 mg per tablet   Commonly known as: LIMBITROL      ferrous gluconate 325 MG tablet   Commonly known as: FERGON      furosemide 80 MG tablet   Commonly known as: LASIX      loperamide 2 MG capsule   Commonly known as: IMODIUM      lovastatin 10 MG tablet   Commonly known as: MEVACOR      magnesium oxide 400 MG tablet   Commonly known as: MAG-OX      metoprolol 50 MG tablet   Commonly known as: LOPRESSOR      oxyCODONE-acetaminophen 5-325 MG per tablet   Commonly known as: PERCOCET      potassium chloride 10 MEQ CR tablet   Commonly known as: KLOR-CON      PROTONIX 40 MG tablet   Generic drug: pantoprazole      sucralfate 1 G tablet   Commonly known as: CARAFATE      warfarin 2 MG tablet   Commonly known as: COUMADIN           Outstanding Labs/Studies: None  Duration of Discharge Encounter: Greater than 30  minutes including physician time.  Signed, R. Hurman Horn, PA-C 11/25/2011, 10:44 AM

## 2011-11-25 NOTE — Progress Notes (Signed)
Pt DC to home. DC instructions  and medications reviewed. Pt questions answered. Pt states understanding.

## 2011-11-25 NOTE — Progress Notes (Signed)
Cosign for Alan Tripp RN assessment, med admin, I/O, and notes. 

## 2011-11-25 NOTE — Discharge Instructions (Signed)
PLEASE REMEMBER TO BRING ALL OF YOUR MEDICATIONS TO EACH OF YOUR FOLLOW-UP OFFICE VISITS.  PLEASE ATTEND ALL APPOINTMENTS ALREADY SCHEDULED FOR YOU.

## 2011-11-25 NOTE — Progress Notes (Signed)
ANTICOAGULATION CONSULT NOTE - Follow Up Consult  Pharmacy Consult for Coumadin Indication: A.Fib  Allergies  Allergen Reactions  . Amiodarone Hives  . Propylthiouracil Rash    Patient Measurements: Height: 5\' 9"  (175.3 cm) Weight: 201 lb 4.5 oz (91.3 kg) (b scale) IBW/kg (Calculated) : 66.2   Vital Signs: Temp: 97.2 F (36.2 C) (04/12 0502) Temp src: Oral (04/12 0502) BP: 136/68 mmHg (04/12 1120) Pulse Rate: 66  (04/12 1120)  Labs:  Basename 11/25/11 1100 11/25/11 0618 11/24/11 1130 11/24/11 0312 11/24/11 0311 11/23/11 2024 11/23/11 1155 11/23/11 1139  HGB 11.3* -- -- -- 10.5* -- -- --  HCT 34.2* -- -- -- 32.4* -- -- 33.9*  PLT 177 -- -- -- 152 -- -- 176  APTT -- -- -- -- -- -- -- --  LABPROT -- 23.8* -- -- 37.4* -- 36.0* --  INR -- 2.09* -- -- 3.72* -- 3.54* --  HEPARINUNFRC -- -- -- -- -- -- -- --  CREATININE 2.41* -- -- -- 2.41* -- -- 2.14*  CKTOTAL -- -- 50 42 -- 59 -- --  CKMB -- -- 1.6 1.7 -- 1.9 -- --  TROPONINI -- -- <0.30 <0.30 -- <0.30 -- --   Estimated Creatinine Clearance: 24.3 ml/min (by C-G formula based on Cr of 2.41).   Medications:  Scheduled:    . ALPRAZolam  0.25 mg Oral BID  . chlordiazePOXIDE  5 mg Oral Daily   And  . amitriptyline  12.5 mg Oral Daily  . amLODipine  5 mg Oral Daily  . aspirin EC  81 mg Oral Daily  . ferrous gluconate  324 mg Oral Q breakfast  . furosemide  80 mg Intravenous BID  . magnesium oxide  400 mg Oral Daily  . metoprolol  100 mg Oral BID  . pantoprazole  40 mg Oral Q1200  . potassium chloride  40 mEq Oral BID  . simvastatin  5 mg Oral q1800  . sodium chloride  3 mL Intravenous Q12H  . sucralfate  1 g Oral TID  . Warfarin - Pharmacist Dosing Inpatient   Does not apply q1800  . DISCONTD: ferrous gluconate  325 mg Oral Q breakfast    Assessment: 77yo female with A.Fib, plan home today.  INR 2.09 with significant drop from > 3, no Vit K given.  No problems noted.  Goal of Therapy:  INR 2-3   Plan:  1.   Coumadin 3mg  today (if still here) 2.  Recommend checking INR on Mon or Tues of next week  Marisue Humble, PharmD Clinical Pharmacist [facility]

## 2011-11-25 NOTE — Progress Notes (Signed)
Patient ID: [name], female   DOB: Jan 02, 1935, 77 y.o.   MRN: [id number]    Subjective:  Denies SSCP, palpitations or Dyspnea   Objective:  Filed Vitals:   11/24/11 0941 11/24/11 1358 11/24/11 2105 11/25/11 0502  BP: 138/68 139/71 139/65 148/81  Pulse: 64 61 60 59  Temp:  97.5 F (36.4 C) 98 F (36.7 C) 97.2 F (36.2 C)  TempSrc:  Oral Oral Oral  Resp: 18 18 18 20   Height:      Weight:      SpO2: 99% 95% 95% 97%    Intake/Output from previous day:  Intake/Output Summary (Last 24 hours) at 11/25/11 4098 Last data filed at 11/25/11 0815  Gross per 24 hour  Intake   1654 ml  Output   3950 ml  Net  -2296 ml    Physical Exam: Affect appropriate Healthy:  appears stated age HEENT: normal Neck supple with no adenopathy JVP normal no bruits no thyromegaly Lungs clear with no wheezing and good diaphragmatic motion Heart:  S1/S2 no murmur, no rub, gallop or click PMI normal Abdomen: benighn, BS positve, no tenderness, no AAA no bruit.  No HSM or HJR Distal pulses intact with no bruits No edema Neuro non-focal Skin warm and dry No muscular weakness Pacer under left clavicle   Lab Results: Basic Metabolic Panel:  Basename 11/24/11 0311 11/23/11 1139  NA 140 140  K 3.5 4.2  CL 106 106  CO2 25 24  GLUCOSE 96 114*  BUN 35* 32*  CREATININE 2.41* 2.14*  CALCIUM 8.9 9.3  MG -- --  PHOS -- --     Basename 11/24/11 0311 11/23/11 1139  WBC 4.5 4.7  NEUTROABS -- --  HGB 10.5* 11.1*  HCT 32.4* 33.9*  MCV 86.2 87.1  PLT 152 176   Cardiac Enzymes:  Basename 11/24/11 1130 11/24/11 0312 11/23/11 2024  CKTOTAL 50 42 59  CKMB 1.6 1.7 1.9  CKMBINDEX -- -- --  TROPONINI <0.30 <0.30 <0.30    Imaging: Dg Chest 2 View  11/23/2011  *RADIOLOGY REPORT*  Clinical Data: Chest pain and shortness of breath.  History of coronary artery disease.  CHEST - 2 VIEW  Comparison: 07/19 and 02/27/2009  Findings: Heart size is normal.  Triple lead pacer in place.  Chronic elevation of the right hemidiaphragm.  Pulmonary vascularity is normal and the lungs are clear.  No effusion.  IMPRESSION: No acute abnormalities.  Original Report Authenticated By: Gwynn Burly, M.D.    Cardiac Studies:    Telemetry: afib v pacing  Echo:   Medications:      . ALPRAZolam  0.25 mg Oral BID  . chlordiazePOXIDE  5 mg Oral Daily   And  . amitriptyline  12.5 mg Oral Daily  . amLODipine  5 mg Oral Daily  . aspirin EC  81 mg Oral Daily  . ferrous gluconate  324 mg Oral Q breakfast  . furosemide  80 mg Intravenous BID  . magnesium oxide  400 mg Oral Daily  . metoprolol  100 mg Oral BID  . pantoprazole  40 mg Oral Q1200  . potassium chloride  40 mEq Oral BID  . simvastatin  5 mg Oral q1800  . sodium chloride  3 mL Intravenous Q12H  . sucralfate  1 g Oral TID  . Warfarin - Pharmacist Dosing Inpatient   Does not apply q1800  . DISCONTD: chloridazePOXIDE-amitriptyline  1 tablet Oral Daily  . DISCONTD: ferrous gluconate  325 mg Oral  Q breakfast       Assessment/Plan:  CAD:  R/O no pain this am  Atypical pain no indication for cath especially with renal function and Rx INR CHF:  Improved.  Clear lungs.  D/C on  Previous dose of lasix 80mg  daily Afib:  S/P AV node ablation for rate and AICD backup INR back down to 2.01  Resume normal dose of coumadin  D/C home has F/U with me in 3 weeks  Charlton Haws 11/25/2011, 8:32 AM

## 2011-11-30 ENCOUNTER — Ambulatory Visit: Payer: Medicare Other | Admitting: Cardiovascular Disease

## 2011-11-30 ENCOUNTER — Encounter: Payer: Self-pay | Admitting: Physician Assistant

## 2011-11-30 ENCOUNTER — Ambulatory Visit (INDEPENDENT_AMBULATORY_CARE_PROVIDER_SITE_OTHER): Payer: Medicare Other | Admitting: Physician Assistant

## 2011-11-30 VITALS — BP 140/80 | HR 62 | Ht 69.0 in | Wt 205.0 lb

## 2011-11-30 DIAGNOSIS — I509 Heart failure, unspecified: Secondary | ICD-10-CM

## 2011-11-30 DIAGNOSIS — I2510 Atherosclerotic heart disease of native coronary artery without angina pectoris: Secondary | ICD-10-CM

## 2011-11-30 DIAGNOSIS — N259 Disorder resulting from impaired renal tubular function, unspecified: Secondary | ICD-10-CM

## 2011-11-30 DIAGNOSIS — I4891 Unspecified atrial fibrillation: Secondary | ICD-10-CM

## 2011-11-30 MED ORDER — FUROSEMIDE 80 MG PO TABS: 80.0000 mg | ORAL_TABLET | ORAL | Status: DC

## 2011-11-30 NOTE — Assessment & Plan Note (Signed)
Patient had recent hospitalization with CHF. We have to be careful with her diuretics because of her stage IV kidney disease. She still has fluid overload on exam today. I will give her extra 40 mg of Lasix today and tomorrow. She is due for blood work today from her renal physicians. We will ask to get those results. She should also follow a 2 g sodium diet that  we will provide.

## 2011-11-30 NOTE — Assessment & Plan Note (Signed)
Followup labs today at [facility]

## 2011-11-30 NOTE — Patient Instructions (Addendum)
Your physician recommends that you keep a follow-up appointment as scheduled Your physician has recommended you make the following change in your medication: TAKE EXTRA 40 mg of Furosemide for 2 days          2 Gram Low Sodium Diet A 2 gram sodium diet restricts the amount of sodium in the diet to no more than 2 g or 2000 mg daily. Limiting the amount of sodium is often used to help lower blood pressure. It is important if you have heart, liver, or kidney problems. Many foods contain sodium for flavor and sometimes as a preservative. When the amount of sodium in a diet needs to be low, it is important to know what to look for when choosing foods and drinks. The following includes some information and guidelines to help make it easier for you to adapt to a low sodium diet. QUICK TIPS  Do not add salt to food.   Avoid convenience items and fast food.   Choose unsalted snack foods.   Buy lower sodium products, often labeled as "lower sodium" or "no salt added."   Check food labels to learn how much sodium is in 1 serving.   When eating at a restaurant, ask that your food be prepared with less salt or none, if possible.  READING FOOD LABELS FOR SODIUM INFORMATION The nutrition facts label is a good place to find how much sodium is in foods. Look for products with no more than 500 to 600 mg of sodium per meal and no more than 150 mg per serving. Remember that 2 g = 2000 mg. The food label may also list foods as:  Sodium-free: Less than 5 mg in a serving.   Very low sodium: 35 mg or less in a serving.   Low-sodium: 140 mg or less in a serving.   Light in sodium: 50% less sodium in a serving. For example, if a food that usually has 300 mg of sodium is changed to become light in sodium, it will have 150 mg of sodium.   Reduced sodium: 25% less sodium in a serving. For example, if a food that usually has 400 mg of sodium is changed to reduced sodium, it will have 300 mg of sodium.    CHOOSING FOODS Grains  Avoid: Salted crackers and snack items. Some cereals, including instant hot cereals. Bread stuffing and biscuit mixes. Seasoned rice or pasta mixes.   Choose: Unsalted snack items. Low-sodium cereals, oats, puffed wheat and rice, shredded wheat. English muffins and bread. Pasta.  Meats  Avoid: Salted, canned, smoked, spiced, pickled meats, including fish and poultry. Bacon, ham, sausage, cold cuts, hot dogs, anchovies.   Choose: Low-sodium canned tuna and salmon. Fresh or frozen meat, poultry, and fish.  Dairy  Avoid: Processed cheese and spreads. Cottage cheese. Buttermilk and condensed milk. Regular cheese.   Choose: Milk. Low-sodium cottage cheese. Yogurt. Sour cream. Low-sodium cheese.  Fruits and Vegetables  Avoid: Regular canned vegetables. Regular canned tomato sauce and paste. Frozen vegetables in sauces. Olives. Rosita Fire. Relishes. Sauerkraut.   Choose: Low-sodium canned vegetables. Low-sodium tomato sauce and paste. Frozen or fresh vegetables. Fresh and frozen fruit.  Condiments  Avoid: Canned and packaged gravies. Worcestershire sauce. Tartar sauce. Barbecue sauce. Soy sauce. Steak sauce. Ketchup. Onion, garlic, and table salt. Meat flavorings and tenderizers.   Choose: Fresh and dried herbs and spices. Low-sodium varieties of mustard and ketchup. Lemon juice. Tabasco sauce. Horseradish.  SAMPLE 2 GRAM SODIUM MEAL PLAN Breakfast / Sodium (  mg)  1 cup low-fat milk / 143 mg   2 slices whole-wheat toast / 270 mg   1 tbs heart-healthy margarine / 153 mg   1 hard-boiled egg / 139 mg   1 small orange / 0 mg  Lunch / Sodium (mg)  1 cup raw carrots / 76 mg    cup hummus / 298 mg   1 cup low-fat milk / 143 mg    cup red grapes / 2 mg   1 whole-wheat pita bread / 356 mg  Dinner / Sodium (mg)  1 cup whole-wheat pasta / 2 mg   1 cup low-sodium tomato sauce / 73 mg   3 oz lean ground beef / 57 mg   1 small side salad (1 cup raw spinach  leaves,  cup cucumber,  cup yellow bell pepper) with 1 tsp olive oil and 1 tsp red wine vinegar / 25 mg  Snack / Sodium (mg)  1 container low-fat vanilla yogurt / 107 mg   3 graham cracker squares / 127 mg  Nutrient Analysis  Calories: 2033   Protein: 77 g   Carbohydrate: 282 g   Fat: 72 g   Sodium: 1971 mg  Document Released: 08/01/2005 Document Revised: 07/21/2011 Document Reviewed: 11/02/2009 [facility] Patient Information 2012 [place], [place].

## 2011-11-30 NOTE — Progress Notes (Signed)
HPI:   [name] is a 77yo female with PMHx significant for CAD (s/p DES-LCx), symptomatic bradycardia (SSS noted on history s/p AVN ablation, [employer] SENSIA Bi-V ICD implantation, upgraded 2008), permanent atrial fibrillation (on chronic Coumadin anticoagulation), HTN, CKD and GERD who presents to [facility] ED with complaints of chest pain and shortness of breath.   She presented to [facility] ED on 04/10 with complaints of increased shortness of breath and wheezing with associated increased LE swelling and some chest pain over the last few weeks described as burning. She was treated for acute on chronic CHF and R/O for an MI.  Her INR was found to be supratherapeutic. Her Coumadin was adjusted. She diuresed and her renal function was followed closely because of her stage IV renal insufficiency.  The patient says she's feeling well without dyspnea or chest pain. She still has some swelling. Her normal weight is about 197 and she is 205 pounds today. She states she is following a low sodium diet.    Allergies  Allergen Reactions  . Amiodarone Hives  . Propylthiouracil Rash    Current Outpatient Prescriptions on File Prior to Visit  Medication Sig Dispense Refill  . ALPRAZolam (XANAX) 0.25 MG tablet Take 0.25 mg by mouth 2 (two) times daily.      Marland Kitchen amitriptyline-chlordiazePOXIDE (LIMBITROL) 12.5-5 MG per tablet Take 1 tablet by mouth daily.      Marland Kitchen amLODipine (NORVASC) 5 MG tablet Take 5 mg by mouth daily.        Marland Kitchen aspirin 81 MG tablet Take 81 mg by mouth daily.        . ferrous gluconate (FERGON) 325 MG tablet Take 325 mg by mouth daily with breakfast.       . furosemide (LASIX) 80 MG tablet Take 40 mg by mouth daily.       Marland Kitchen loperamide (IMODIUM) 2 MG capsule Take 2 mg by mouth daily as needed. For diarrhea      . lovastatin (MEVACOR) 10 MG tablet Take 10 mg by mouth at bedtime.        . magnesium oxide (MAG-OX) 400 MG tablet Take 400 mg by mouth daily.       . metoprolol (LOPRESSOR) 50 MG tablet  Take 100 mg by mouth 2 (two) times daily.       Marland Kitchen oxyCODONE-acetaminophen (PERCOCET) 5-325 MG per tablet Take 1 tablet by mouth every 4 (four) hours as needed. For pain      . potassium chloride (KLOR-CON) 10 MEQ CR tablet Take 10 mEq by mouth 2 (two) times daily.       . sucralfate (CARAFATE) 1 G tablet Take 1 g by mouth 3 (three) times daily.        Marland Kitchen warfarin (COUMADIN) 2 MG tablet Take 2-3 mg by mouth at bedtime. Pt takes 1 & 1/2 tabs on m, Friday, and 1 tab all other days      . pantoprazole (PROTONIX) 40 MG tablet Take 40 mg by mouth daily.        Marland Kitchen DISCONTD: FLUoxetine (PROZAC) 20 MG capsule Take 20 mg by mouth daily.          Past Medical History  Diagnosis Date  . Cardiac pacemaker in situ   . Cellulitis and abscess of unspecified site   . CAD (coronary artery disease)   . Edema   . Renal insufficiency   . Hypertension   . Gastrointestinal hemorrhage   . GERD (gastroesophageal reflux disease)   . Depression   .  CHF (congestive heart failure)   . Warfarin anticoagulation   . GI bleed     due to ulcers  . Other specified cardiac dysrhythmias     bradycardia  . SSS (sick sinus syndrome)   . Atrial fibrillation with rapid ventricular response     /e-chart 10/2004  . Polycystic kidney disease     /E-chart  . Peripheral vascular disease   . Heart murmur   . Myocardial infarction 1996; ?2006  . Angina   . Shortness of breath on exertion   . Hyperthyroidism   . Anemia, unspecified   . Blood transfusion   . H/O hiatal hernia   . Umbilical hernia     unrepaired (11/24/11)  . Migraines 11/24/11    "I've always had them"  . Arthritis   . Gout     "get it in my feet"    Past Surgical History  Procedure Date  . Esophagogastroduodenoscopy   . Tonsillectomy ~ 1960  . Coronary angioplasty     percutanous transluminal   . Cardiac catheterization 2006; 2008; 2009; 01/13/2011  . Back surgery   . Laminectomy and microdiscectomy lumbar spine 08/2003  . Cardioversion 10/2004;  02/2007    /E-chart  . Cholecystectomy   . Appendectomy 1954  . Vaginal hysterectomy ?1981  . Av node ablation 02/2007    /E-chart  . Insert / replace / remove pacemaker 01/2007    initial placement  . Insert / replace / remove pacemaker 06/2007    upgraded/E-chart  . Av fistula placement, brachiocephalic 09/2007    right; "didn't work; my veins were too small"  . Joint replacement   . Total knee arthroplasty 01/2009    left  . Bladder and bowel 1970's    "tacked; damaged my intestines & had to remove some; done @ [facility]"    Family History  Problem Relation Age of Onset  . Colon cancer Neg Hx   . Thyroid disease Neg Hx   . Stroke Brother   . Kidney disease Brother   . Other Mother     Sepsis and perforated colon  . Other Father     Motor vehicle accident    History   Social History  . Marital Status: Widowed    Spouse Name: N/A    Number of Children: N/A  . Years of Education: N/A   Occupational History  . Not on file.   Social History Main Topics  . Smoking status: Former Smoker -- 1.0 packs/day for 50 years    Types: Cigarettes    Quit date: 09/15/2004  . Smokeless tobacco: Never Used  . Alcohol Use: No  . Drug Use: No  . Sexually Active: No   Other Topics Concern  . Not on file   Social History Narrative  . No narrative on file    ROS:see history of present illness otherwise negative   PHYSICAL EXAM: Well-nournished, in no acute distress. Neck: No JVD, HJR, Bruit, or thyroid enlargement Lungs: decreased breath sounds with crackles at both bases, clear in the upper lung fields Cardiovascular: RRR, PMI not displaced,2/6 systolic murmur at the left sternal border and apex, gallops, bruit, thrill, or heave. Abdomen: BS normal. Soft without organomegaly, masses, lesions or tenderness. Extremities: +1-2 edema bilaterally in the lower extremities,without cyanosis, clubbing. Good distal pulses bilateral SKin: Warm, no lesions or rashes  Musculoskeletal:  No deformities Neuro: no focal signs  BP 140/80  Pulse 62  Ht 5\' 9"  (1.753 m)  Wt 205  lb (92.987 kg)  BMI 30.27 kg/m2   HYQ:MVHQIONGEXB paced  2D ECHOCARDIOGRAM WITH CONTRAST- 11/24/11  Study Conclusions  - Left ventricle: The cavity size was normal. Wall thickness was normal. Systolic function was normal. The estimated ejection fraction was in the range of 55% to 60%. - Mitral valve: Mild to moderate regurgitation. - Left atrium: The atrium was moderately dilated. - Right ventricle: The cavity size was mildly dilated. - Right atrium: The atrium was mildly dilated. - Atrial septum: No defect or patent foramen ovale was identified. - Pulmonary arteries: PA peak pressure: 39mm Hg (S).

## 2011-11-30 NOTE — Assessment & Plan Note (Signed)
Patient did have atypical chest pain in the hospital in the setting of heart failure and stripper a therapeutic INR and anemia. This has resolved and she ruled out for an MI. She has had no further chest pain.

## 2011-12-08 ENCOUNTER — Ambulatory Visit (INDEPENDENT_AMBULATORY_CARE_PROVIDER_SITE_OTHER): Payer: Medicare Other | Admitting: Internal Medicine

## 2011-12-08 ENCOUNTER — Encounter: Payer: Self-pay | Admitting: Internal Medicine

## 2011-12-08 VITALS — BP 134/76 | HR 62 | Ht 67.0 in | Wt 211.8 lb

## 2011-12-08 DIAGNOSIS — Z950 Presence of cardiac pacemaker: Secondary | ICD-10-CM

## 2011-12-08 DIAGNOSIS — I5043 Acute on chronic combined systolic (congestive) and diastolic (congestive) heart failure: Secondary | ICD-10-CM

## 2011-12-08 DIAGNOSIS — I4891 Unspecified atrial fibrillation: Secondary | ICD-10-CM

## 2011-12-08 DIAGNOSIS — I509 Heart failure, unspecified: Secondary | ICD-10-CM

## 2011-12-08 LAB — PACEMAKER DEVICE OBSERVATION
BATTERY VOLTAGE: 2.988 V
RV LEAD IMPEDENCE PM: 478 Ohm
RV LEAD THRESHOLD: 1 V
VENTRICULAR PACING PM: 98

## 2011-12-08 NOTE — Assessment & Plan Note (Signed)
Her chronic mixed systolic heart failure is compensated. We discussed the importance of a low sodium diet. We discussed the importance of weighing herself every day. I instructed the patient to increase her Lasix by one tablet a day if her weight was up more the 3 or 4 pounds.

## 2011-12-08 NOTE — Assessment & Plan Note (Signed)
Her ventricular rate appears to be well-controlled. She'll continue her current medical therapy including warfarin.

## 2011-12-08 NOTE — Progress Notes (Signed)
HPI [name] returns today for followup. She is a very pleasant 77 year old woman with a history of chronic atrial fibrillation, chronic systolic and diastolic heart failure, complete heart block, status post biventricular pacemaker. The patient has had several hospitalizations with volume overload. She admits to dietary indiscretion. She is still not weighing herself. She notes that her son is living with her now and does not like to eat low-sodium food. She denies syncope. She has chronic peripheral edema. Her heart failure symptoms are class 2-3. Allergies  Allergen Reactions  . Amiodarone Hives  . Propylthiouracil Rash     Current Outpatient Prescriptions  Medication Sig Dispense Refill  . ALPRAZolam (XANAX) 0.25 MG tablet Take 0.25 mg by mouth 2 (two) times daily.      Marland Kitchen amitriptyline-chlordiazePOXIDE (LIMBITROL) 12.5-5 MG per tablet Take 1 tablet by mouth daily.      Marland Kitchen amLODipine (NORVASC) 5 MG tablet Take 5 mg by mouth daily.        Marland Kitchen aspirin 81 MG tablet Take 81 mg by mouth daily.        . ferrous gluconate (FERGON) 325 MG tablet Take 325 mg by mouth daily with breakfast.       . furosemide (LASIX) 80 MG tablet Take 40 mg by mouth as directed.      . loperamide (IMODIUM) 2 MG capsule Take 2 mg by mouth daily as needed. For diarrhea      . lovastatin (MEVACOR) 10 MG tablet Take 10 mg by mouth at bedtime.        . magnesium oxide (MAG-OX) 400 MG tablet Take 400 mg by mouth daily.       . metoprolol (LOPRESSOR) 50 MG tablet Take 100 mg by mouth 2 (two) times daily.       Marland Kitchen oxyCODONE-acetaminophen (PERCOCET) 5-325 MG per tablet Take 1 tablet by mouth every 4 (four) hours as needed. For pain      . pantoprazole (PROTONIX) 40 MG tablet Take 40 mg by mouth daily.        . potassium chloride (KLOR-CON) 10 MEQ CR tablet Take 10 mEq by mouth 2 (two) times daily.       Marland Kitchen spironolactone (ALDACTONE) 25 MG tablet Take 25 mg by mouth daily.      . sucralfate (CARAFATE) 1 G tablet Take 1 g by  mouth 3 (three) times daily.        Marland Kitchen warfarin (COUMADIN) 2 MG tablet Take 2-3 mg by mouth at bedtime. Pt takes 1 & 1/2 tabs on m, Friday, and 1 tab all other days      . DISCONTD: furosemide (LASIX) 80 MG tablet Take 1 tablet (80 mg total) by mouth as directed.      Marland Kitchen DISCONTD: FLUoxetine (PROZAC) 20 MG capsule Take 20 mg by mouth daily.           Past Medical History  Diagnosis Date  . Cardiac pacemaker in situ   . Cellulitis and abscess of unspecified site   . CAD (coronary artery disease)   . Edema   . Renal insufficiency   . Hypertension   . Gastrointestinal hemorrhage   . GERD (gastroesophageal reflux disease)   . Depression   . CHF (congestive heart failure)   . Warfarin anticoagulation   . GI bleed     due to ulcers  . Other specified cardiac dysrhythmias     bradycardia  . SSS (sick sinus syndrome)   . Atrial fibrillation with rapid ventricular response     /  e-chart 10/2004  . Polycystic kidney disease     /E-chart  . Peripheral vascular disease   . Heart murmur   . Myocardial infarction 1996; ?2006  . Angina   . Shortness of breath on exertion   . Hyperthyroidism   . Anemia, unspecified   . Blood transfusion   . H/O hiatal hernia   . Umbilical hernia     unrepaired (11/24/11)  . Migraines 11/24/11    "I've always had them"  . Arthritis   . Gout     "get it in my feet"    ROS:   All systems reviewed and negative except as noted in the HPI.   Past Surgical History  Procedure Date  . Esophagogastroduodenoscopy   . Tonsillectomy ~ 1960  . Coronary angioplasty     percutanous transluminal   . Cardiac catheterization 2006; 2008; 2009; 01/13/2011  . Back surgery   . Laminectomy and microdiscectomy lumbar spine 08/2003  . Cardioversion 10/2004; 02/2007    /E-chart  . Cholecystectomy   . Appendectomy 1954  . Vaginal hysterectomy ?1981  . Av node ablation 02/2007    /E-chart  . Insert / replace / remove pacemaker 01/2007    initial placement  . Insert /  replace / remove pacemaker 06/2007    upgraded/E-chart  . Av fistula placement, brachiocephalic 09/2007    right; "didn't work; my veins were too small"  . Joint replacement   . Total knee arthroplasty 01/2009    left  . Bladder and bowel 1970's    "tacked; damaged my intestines & had to remove some; done @ [facility]"     Family History  Problem Relation Age of Onset  . Colon cancer Neg Hx   . Thyroid disease Neg Hx   . Stroke Brother   . Kidney disease Brother   . Other Mother     Sepsis and perforated colon  . Other Father     Motor vehicle accident     History   Social History  . Marital Status: Widowed    Spouse Name: N/A    Number of Children: N/A  . Years of Education: N/A   Occupational History  . Not on file.   Social History Main Topics  . Smoking status: Former Smoker -- 1.0 packs/day for 50 years    Types: Cigarettes    Quit date: 09/15/2004  . Smokeless tobacco: Never Used  . Alcohol Use: No  . Drug Use: No  . Sexually Active: No   Other Topics Concern  . Not on file   Social History Narrative  . No narrative on file     BP 134/76  Pulse 62  Ht 5\' 7"  (1.702 m)  Wt 211 lb 12.8 oz (96.072 kg)  BMI 33.17 kg/m2  Physical Exam:  Well appearing elderly woman, NAD HEENT: Unremarkable Neck:  No JVD, no thyromegally Lungs:  Clear with no wheezes, rales, or rhonchi. Well-healed pacemaker incision. HEART:  Regular rate rhythm, no murmurs, no rubs, no clicks Abd:  soft, positive bowel sounds, no organomegally, no rebound, no guarding Ext:  2 plus pulses, 2+ peripheral edema, no cyanosis, no clubbing Skin:  No rashes no nodules Neuro:  CN II through XII intact, motor grossly intact  DEVICE  Normal device function.  See PaceArt for details.   Assess/Plan:

## 2011-12-08 NOTE — Assessment & Plan Note (Signed)
Her biventricular pacemaker is working normally. We'll plan to recheck in several months. 

## 2011-12-08 NOTE — Patient Instructions (Addendum)
Your physician wants you to follow-up in: 6 months in the Device Clinic and 12 months with Dr Court Joy will receive a reminder letter in the mail two months in advance. If you don't receive a letter, please call our office to schedule the follow-up appointment.

## 2011-12-12 ENCOUNTER — Encounter: Payer: Self-pay | Admitting: Cardiovascular Disease

## 2011-12-15 ENCOUNTER — Ambulatory Visit (INDEPENDENT_AMBULATORY_CARE_PROVIDER_SITE_OTHER): Payer: Medicare Other | Admitting: Cardiovascular Disease

## 2011-12-15 ENCOUNTER — Encounter: Payer: Self-pay | Admitting: Cardiovascular Disease

## 2011-12-15 VITALS — BP 142/84 | HR 66 | Ht 67.0 in | Wt 211.0 lb

## 2011-12-15 DIAGNOSIS — I509 Heart failure, unspecified: Secondary | ICD-10-CM

## 2011-12-15 DIAGNOSIS — E0590 Thyrotoxicosis, unspecified without thyrotoxic crisis or storm: Secondary | ICD-10-CM

## 2011-12-15 DIAGNOSIS — I4891 Unspecified atrial fibrillation: Secondary | ICD-10-CM

## 2011-12-15 DIAGNOSIS — Z79899 Other long term (current) drug therapy: Secondary | ICD-10-CM

## 2011-12-15 DIAGNOSIS — I10 Essential (primary) hypertension: Secondary | ICD-10-CM

## 2011-12-15 DIAGNOSIS — I2510 Atherosclerotic heart disease of native coronary artery without angina pectoris: Secondary | ICD-10-CM

## 2011-12-15 NOTE — Assessment & Plan Note (Signed)
Well controlled.  Continue current medications and low sodium Dash type diet.    

## 2011-12-15 NOTE — Patient Instructions (Signed)
Your physician recommends that you schedule a follow-up appointment in: 2 MONTHS WITH DR [facility] Your physician has recommended you make the following change in your medication: STOP  KLOR CON  AND  TAKE SPIRONOLACTONE  25 MG EVERY DAY  FUROSEMIDE  40 MG  TWICE A DAY Your physician recommends that you return for lab work in: TODAY BMET  DX V58.69

## 2011-12-15 NOTE — Assessment & Plan Note (Signed)
Good rate contorl and anticoagulaiton.  Failed [facility]

## 2011-12-15 NOTE — Assessment & Plan Note (Signed)
Improved BiV AICD working well continue current dose aldactone and lasix hold KCL and check BMET

## 2011-12-15 NOTE — Assessment & Plan Note (Signed)
Continjue replacement Rx F/U primary

## 2011-12-15 NOTE — Assessment & Plan Note (Signed)
Stable with no angina and good activity level.  Continue medical Rx  

## 2011-12-15 NOTE — Progress Notes (Signed)
[name] returns today for followup. She is a very pleasant 77 year old woman with a history of chronic atrial fibrillation, chronic systolic and diastolic heart failure, complete heart block, status post biventricular pacemaker. The patient has had several hospitalizations with volume overload. She admits to dietary indiscretion. She is still not weighing herself. She notes that her son is living with her now and does not like to eat low-sodium food. She denies syncope. She has chronic peripheral edema. Her heart failure symptoms are class 2-3.  Has significant for CAD (s/p DES-LCx)  Echo 11/24/11 - Left ventricle: The cavity size was normal. Wall thickness was normal. Systolic function was normal. The estimated ejection fraction was in the range of 55% to 60%. - Mitral valve: Mild to moderate regurgitation. - Left atrium: The atrium was moderately dilated. - Right ventricle: The cavity size was mildly dilated. - Right atrium: The atrium was mildly dilated. - Atrial septum: No defect or patent foramen ovale was identified. - Pulmonary arteries: PA peak pressure: 39mm Hg (S).  Confused about meds. Should be on pravastatin 10 mg for cholesterol  Is on lasix 40 bid and aldactone 25 dialy for diuretic.  Will stop KCL and check BMET today.  ROS: Denies fever, malais, weight loss, blurry vision, decreased visual acuity, cough, sputum, SOB, hemoptysis, pleuritic pain, palpitaitons, heartburn, abdominal pain, melena, lower extremity edema, claudication, or rash.  All other systems reviewed and negative  General: Affect appropriate Healthy:  appears stated age HEENT: normal Neck supple with no adenopathy JVP normal no bruits no thyromegaly Lungs clear with no wheezing and good diaphragmatic motion Heart:  S1/S2 no murmur, no rub, gallop or click PMI normal Abdomen: benighn, BS positve, no tenderness, no AAA no bruit.  No HSM or HJR Distal pulses intact with no bruits No edema Neuro  non-focal Skin warm and dry No muscular weakness   Current Outpatient Prescriptions  Medication Sig Dispense Refill  . ALPRAZolam (XANAX) 0.25 MG tablet Take 0.25 mg by mouth 2 (two) times daily.      Marland Kitchen amitriptyline-chlordiazePOXIDE (LIMBITROL) 12.5-5 MG per tablet Take 1 tablet by mouth daily.      Marland Kitchen amLODipine (NORVASC) 5 MG tablet Take 5 mg by mouth daily.        Marland Kitchen aspirin 81 MG tablet Take 81 mg by mouth daily.        . ferrous gluconate (FERGON) 325 MG tablet Take 325 mg by mouth daily with breakfast.       . furosemide (LASIX) 40 MG tablet Take 40 mg by mouth 2 (two) times daily.      Marland Kitchen lovastatin (MEVACOR) 10 MG tablet Take 10 mg by mouth at bedtime.        . magnesium oxide (MAG-OX) 400 MG tablet Take 400 mg by mouth daily.       . metoprolol (LOPRESSOR) 100 MG tablet Take 100 mg by mouth 2 (two) times daily.      Marland Kitchen oxyCODONE-acetaminophen (PERCOCET) 5-325 MG per tablet Take 1 tablet by mouth every 4 (four) hours as needed. For pain      . pantoprazole (PROTONIX) 40 MG tablet Take 40 mg by mouth daily.        . potassium chloride (KLOR-CON) 10 MEQ CR tablet Take 10 mEq by mouth 2 (two) times daily.       Marland Kitchen spironolactone (ALDACTONE) 25 MG tablet Take 25 mg by mouth daily.      Marland Kitchen warfarin (COUMADIN) 2 MG tablet Take 2-3 mg by  mouth at bedtime. Pt takes 1 & 1/2 tabs on m, Friday, and 1 tab all other days      . DISCONTD: FLUoxetine (PROZAC) 20 MG capsule Take 20 mg by mouth daily.          Allergies  Amiodarone and Propylthiouracil  Electrocardiogram:  11/30/11  A pacing with afib no acute changes  Assessment and Plan

## 2011-12-16 LAB — BASIC METABOLIC PANEL
BUN: 43 mg/dL — ABNORMAL HIGH (ref 6–23)
Calcium: 9.2 mg/dL (ref 8.4–10.5)
Chloride: 98 mEq/L (ref 96–112)
Creatinine, Ser: 3.2 mg/dL — ABNORMAL HIGH (ref 0.4–1.2)
GFR: 15.22 mL/min — ABNORMAL LOW (ref >60.00)

## 2011-12-19 ENCOUNTER — Telehealth: Payer: Self-pay | Admitting: Cardiovascular Disease

## 2011-12-19 DIAGNOSIS — Z79899 Other long term (current) drug therapy: Secondary | ICD-10-CM

## 2011-12-19 MED ORDER — FUROSEMIDE 40 MG PO TABS: 40.0000 mg | ORAL_TABLET | Freq: Every day | ORAL | Status: DC

## 2011-12-19 NOTE — Telephone Encounter (Signed)
Fu call °Pt calling back again °

## 2011-12-19 NOTE — Telephone Encounter (Signed)
PT'S SON AWARE OF LAB RESULTS AND MED CHANGES .Zack Seal

## 2011-12-19 NOTE — Telephone Encounter (Signed)
New Problem:     Patient returned your phone call.  Please call back. 

## 2011-12-19 NOTE — Telephone Encounter (Signed)
LMTCB ./CY 

## 2012-01-10 ENCOUNTER — Other Ambulatory Visit: Payer: Medicare Other

## 2012-01-18 ENCOUNTER — Other Ambulatory Visit: Payer: Medicare Other

## 2012-01-23 ENCOUNTER — Other Ambulatory Visit (INDEPENDENT_AMBULATORY_CARE_PROVIDER_SITE_OTHER): Payer: Medicare Other

## 2012-01-23 DIAGNOSIS — Z79899 Other long term (current) drug therapy: Secondary | ICD-10-CM

## 2012-01-23 LAB — BASIC METABOLIC PANEL
Calcium: 8.7 mg/dL (ref 8.4–10.5)
GFR: 20.22 mL/min — ABNORMAL LOW (ref >60.00)
Glucose, Bld: 86 mg/dL (ref 70–99)
Sodium: 138 mEq/L (ref 135–145)

## 2012-02-07 ENCOUNTER — Telehealth: Payer: Self-pay | Admitting: Cardiovascular Disease

## 2012-02-07 NOTE — Telephone Encounter (Signed)
Pt was not sure who called yesterday. Reviewed found no telephone calls made. She is doing well today. Reminded pt of appt on 02/15/12. Mylo Red RN

## 2012-02-07 NOTE — Telephone Encounter (Signed)
New problem:  Patient calling having trouble with telephone service this week. Returning call back to nurse from yesterday.

## 2012-02-15 ENCOUNTER — Ambulatory Visit: Payer: Medicare Other | Admitting: Cardiovascular Disease

## 2012-03-15 ENCOUNTER — Ambulatory Visit: Payer: Medicare Other | Admitting: Cardiovascular Disease

## 2012-03-19 ENCOUNTER — Encounter: Payer: Self-pay | Admitting: Cardiovascular Disease

## 2012-03-19 ENCOUNTER — Ambulatory Visit (INDEPENDENT_AMBULATORY_CARE_PROVIDER_SITE_OTHER): Payer: Medicare Other | Admitting: Cardiovascular Disease

## 2012-03-19 VITALS — BP 147/87 | HR 65 | Wt 202.0 lb

## 2012-03-19 DIAGNOSIS — I2510 Atherosclerotic heart disease of native coronary artery without angina pectoris: Secondary | ICD-10-CM

## 2012-03-19 DIAGNOSIS — I498 Other specified cardiac arrhythmias: Secondary | ICD-10-CM

## 2012-03-19 DIAGNOSIS — I509 Heart failure, unspecified: Secondary | ICD-10-CM

## 2012-03-19 DIAGNOSIS — Z79899 Other long term (current) drug therapy: Secondary | ICD-10-CM

## 2012-03-19 NOTE — Assessment & Plan Note (Signed)
Stable with no angina and good activity level.  Continue medical Rx  

## 2012-03-19 NOTE — Progress Notes (Signed)
Patient ID: [name], female   DOB: Dec 06, 1934, 77 y.o.   MRN: [id number] [name] returns today for followup. She is a very pleasant 77 year old woman with a history of chronic atrial fibrillation, chronic systolic and diastolic heart failure, complete heart block, status post biventricular pacemaker. The patient has had several hospitalizations with volume overload. She admits to dietary indiscretion. She is still not weighing herself. She notes that her son is living with her now and does not like to eat low-sodium food. She denies syncope. She has chronic peripheral edema. Her heart failure symptoms are class 2-3. Has significant for CAD (s/p DES-LCx)  CRF with Cr about 2.3  Brother died recently He was on dialysis and drank.  Has a scale and dry weight about 200.  Knows how to adjust lasix accordingly.  F/U BMET in 8 weeks  Echo 11/24/11  - Left ventricle: The cavity size was normal. Wall thickness was normal. Systolic function was normal. The estimated ejection fraction was in the range of 55% to 60%. - Mitral valve: Mild to moderate regurgitation. - Left atrium: The atrium was moderately dilated. - Right ventricle: The cavity size was mildly dilated. - Right atrium: The atrium was mildly dilated. - Atrial septum: No defect or patent foramen ovale was identified. - Pulmonary arteries: PA peak pressure: 39mm Hg (S).  ROS: Denies fever, malais, weight loss, blurry vision, decreased visual acuity, cough, sputum, SOB, hemoptysis, pleuritic pain, palpitaitons, heartburn, abdominal pain, melena, lower extremity edema, claudication, or rash.  All other systems reviewed and negative  General: Affect appropriate Healthy:  appears stated age HEENT: normal Neck supple with no adenopathy JVP normal no bruits no thyromegaly Lungs clear with no wheezing and good diaphragmatic motion Heart:  S1/S2 kSEM  murmur, no rub, gallop or click PMI normal Abdomen: benighn, BS positve, no  tenderness, no AAA no bruit.  No HSM or HJR Distal pulses intact with no bruits Plus one bilateral  edema Neuro non-focal Skin warm and dry No muscular weakness   Current Outpatient Prescriptions  Medication Sig Dispense Refill  . ALPRAZolam (XANAX) 0.25 MG tablet Take 0.25 mg by mouth 2 (two) times daily.      Marland Kitchen amitriptyline-chlordiazePOXIDE (LIMBITROL) 12.5-5 MG per tablet Take 1 tablet by mouth daily.      Marland Kitchen amLODipine (NORVASC) 5 MG tablet Take 5 mg by mouth daily.        Marland Kitchen aspirin 81 MG tablet Take 81 mg by mouth daily.        . ferrous gluconate (FERGON) 325 MG tablet Take 325 mg by mouth daily with breakfast.       . furosemide (LASIX) 40 MG tablet Take 80 mg by mouth daily.      Marland Kitchen lovastatin (MEVACOR) 10 MG tablet Take 10 mg by mouth at bedtime.        . magnesium oxide (MAG-OX) 400 MG tablet Take 400 mg by mouth daily.       . metoprolol (LOPRESSOR) 100 MG tablet Take 100 mg by mouth 2 (two) times daily.      Marland Kitchen oxyCODONE-acetaminophen (PERCOCET) 5-325 MG per tablet Take 1 tablet by mouth every 4 (four) hours as needed. For pain      . pantoprazole (PROTONIX) 40 MG tablet Take 40 mg by mouth daily.        Marland Kitchen spironolactone (ALDACTONE) 25 MG tablet Take 25 mg by mouth daily.      Marland Kitchen warfarin (COUMADIN) 2 MG tablet Take 2-3 mg by  mouth at bedtime. Pt takes 1 & 1/2 tabs on m, Friday, and 1 tab all other days      . DISCONTD: FLUoxetine (PROZAC) 20 MG capsule Take 20 mg by mouth daily.          Allergies  Amiodarone and Propylthiouracil  Electrocardiogram:  Assessment and Plan

## 2012-03-19 NOTE — Assessment & Plan Note (Signed)
Difficult to Rx due to CRF and elevated Cr Continue sliding scale lasix and home weights

## 2012-03-19 NOTE — Assessment & Plan Note (Signed)
Well controlled.  Continue current medications and low sodium Dash type diet.    

## 2012-03-19 NOTE — Patient Instructions (Addendum)
Your physician wants you to follow-up in:  6 MONTHS WITH DR Haywood Filler will receive a reminder letter in the mail two months in advance. If you don't receive a letter, please call our office to schedule the follow-up appointment. Your physician recommends that you continue on your current medications as directed. Please refer to the Current Medication list given to you today. Your physician recommends that you return for lab work in:  8 WEEKS  BMET  DX V58.69

## 2012-03-19 NOTE — Assessment & Plan Note (Signed)
Normal functioning pacer.  F/U EPS

## 2012-04-04 ENCOUNTER — Ambulatory Visit: Payer: Self-pay | Admitting: Pharmacist

## 2012-04-04 DIAGNOSIS — I4891 Unspecified atrial fibrillation: Secondary | ICD-10-CM

## 2012-05-08 ENCOUNTER — Telehealth: Payer: Self-pay | Admitting: Cardiovascular Disease

## 2012-05-08 NOTE — Telephone Encounter (Signed)
I spoke with [name] at [facility] and reported the patient's most recent EF was 55-60 % by echo 11/24/11.

## 2012-05-08 NOTE — Telephone Encounter (Signed)
New problem:  Wants to know patient ejection fractions.

## 2012-05-18 ENCOUNTER — Other Ambulatory Visit (INDEPENDENT_AMBULATORY_CARE_PROVIDER_SITE_OTHER): Payer: Medicare Other

## 2012-05-18 DIAGNOSIS — Z79899 Other long term (current) drug therapy: Secondary | ICD-10-CM

## 2012-05-18 LAB — BASIC METABOLIC PANEL
Chloride: 98 mEq/L (ref 96–112)
Creatinine, Ser: 2.8 mg/dL — ABNORMAL HIGH (ref 0.4–1.2)
Sodium: 133 mEq/L — ABNORMAL LOW (ref 135–145)

## 2012-05-23 ENCOUNTER — Other Ambulatory Visit: Payer: Self-pay | Admitting: *Deleted

## 2012-05-23 DIAGNOSIS — Z79899 Other long term (current) drug therapy: Secondary | ICD-10-CM

## 2012-05-23 MED ORDER — SPIRONOLACTONE 25 MG PO TABS: ORAL_TABLET | ORAL | Status: DC

## 2012-05-23 MED ORDER — FUROSEMIDE 40 MG PO TABS: 40.0000 mg | ORAL_TABLET | Freq: Every day | ORAL | Status: DC

## 2012-05-30 ENCOUNTER — Encounter: Payer: Self-pay | Admitting: *Deleted

## 2012-06-02 ENCOUNTER — Inpatient Hospital Stay (HOSPITAL_COMMUNITY)
Admission: EM | Admit: 2012-06-02 | Discharge: 2012-06-06 | DRG: 302 | Disposition: A | Discharge status: Home Health | Payer: Medicare Other | Attending: Cardiology | Admitting: Cardiology

## 2012-06-02 ENCOUNTER — Emergency Department (HOSPITAL_COMMUNITY): Payer: Medicare Other

## 2012-06-02 ENCOUNTER — Encounter (HOSPITAL_COMMUNITY): Payer: Self-pay | Admitting: *Deleted

## 2012-06-02 DIAGNOSIS — F3289 Other specified depressive episodes: Secondary | ICD-10-CM | POA: Diagnosis present

## 2012-06-02 DIAGNOSIS — Z7901 Long term (current) use of anticoagulants: Secondary | ICD-10-CM

## 2012-06-02 DIAGNOSIS — Z8614 Personal history of Methicillin resistant Staphylococcus aureus infection: Secondary | ICD-10-CM

## 2012-06-02 DIAGNOSIS — I059 Rheumatic mitral valve disease, unspecified: Secondary | ICD-10-CM | POA: Diagnosis present

## 2012-06-02 DIAGNOSIS — I200 Unstable angina: Secondary | ICD-10-CM | POA: Diagnosis present

## 2012-06-02 DIAGNOSIS — I5043 Acute on chronic combined systolic (congestive) and diastolic (congestive) heart failure: Secondary | ICD-10-CM

## 2012-06-02 DIAGNOSIS — D649 Anemia, unspecified: Secondary | ICD-10-CM | POA: Diagnosis present

## 2012-06-02 DIAGNOSIS — Q613 Polycystic kidney, unspecified: Secondary | ICD-10-CM

## 2012-06-02 DIAGNOSIS — R946 Abnormal results of thyroid function studies: Secondary | ICD-10-CM | POA: Diagnosis present

## 2012-06-02 DIAGNOSIS — N179 Acute kidney failure, unspecified: Secondary | ICD-10-CM

## 2012-06-02 DIAGNOSIS — Z950 Presence of cardiac pacemaker: Secondary | ICD-10-CM

## 2012-06-02 DIAGNOSIS — Z888 Allergy status to other drugs, medicaments and biological substances status: Secondary | ICD-10-CM

## 2012-06-02 DIAGNOSIS — N184 Chronic kidney disease, stage 4 (severe): Secondary | ICD-10-CM | POA: Diagnosis present

## 2012-06-02 DIAGNOSIS — I4891 Unspecified atrial fibrillation: Secondary | ICD-10-CM

## 2012-06-02 DIAGNOSIS — N289 Disorder of kidney and ureter, unspecified: Secondary | ICD-10-CM | POA: Diagnosis not present

## 2012-06-02 DIAGNOSIS — I129 Hypertensive chronic kidney disease with stage 1 through stage 4 chronic kidney disease, or unspecified chronic kidney disease: Secondary | ICD-10-CM | POA: Diagnosis present

## 2012-06-02 DIAGNOSIS — N189 Chronic kidney disease, unspecified: Secondary | ICD-10-CM

## 2012-06-02 DIAGNOSIS — Z87891 Personal history of nicotine dependence: Secondary | ICD-10-CM

## 2012-06-02 DIAGNOSIS — Z96659 Presence of unspecified artificial knee joint: Secondary | ICD-10-CM

## 2012-06-02 DIAGNOSIS — I2510 Atherosclerotic heart disease of native coronary artery without angina pectoris: Principal | ICD-10-CM | POA: Diagnosis present

## 2012-06-02 DIAGNOSIS — I739 Peripheral vascular disease, unspecified: Secondary | ICD-10-CM | POA: Diagnosis present

## 2012-06-02 DIAGNOSIS — R079 Chest pain, unspecified: Secondary | ICD-10-CM

## 2012-06-02 DIAGNOSIS — I509 Heart failure, unspecified: Secondary | ICD-10-CM | POA: Diagnosis present

## 2012-06-02 DIAGNOSIS — T502X5A Adverse effect of carbonic-anhydrase inhibitors, benzothiadiazides and other diuretics, initial encounter: Secondary | ICD-10-CM | POA: Diagnosis not present

## 2012-06-02 DIAGNOSIS — Z9861 Coronary angioplasty status: Secondary | ICD-10-CM

## 2012-06-02 DIAGNOSIS — E0590 Thyrotoxicosis, unspecified without thyrotoxic crisis or storm: Secondary | ICD-10-CM | POA: Diagnosis present

## 2012-06-02 DIAGNOSIS — K219 Gastro-esophageal reflux disease without esophagitis: Secondary | ICD-10-CM | POA: Diagnosis present

## 2012-06-02 DIAGNOSIS — Z79899 Other long term (current) drug therapy: Secondary | ICD-10-CM

## 2012-06-02 DIAGNOSIS — Z9089 Acquired absence of other organs: Secondary | ICD-10-CM

## 2012-06-02 DIAGNOSIS — M129 Arthropathy, unspecified: Secondary | ICD-10-CM | POA: Diagnosis present

## 2012-06-02 DIAGNOSIS — Z90710 Acquired absence of both cervix and uterus: Secondary | ICD-10-CM

## 2012-06-02 DIAGNOSIS — Z8711 Personal history of peptic ulcer disease: Secondary | ICD-10-CM

## 2012-06-02 DIAGNOSIS — I5033 Acute on chronic diastolic (congestive) heart failure: Secondary | ICD-10-CM

## 2012-06-02 DIAGNOSIS — R7989 Other specified abnormal findings of blood chemistry: Secondary | ICD-10-CM

## 2012-06-02 DIAGNOSIS — K449 Diaphragmatic hernia without obstruction or gangrene: Secondary | ICD-10-CM | POA: Diagnosis present

## 2012-06-02 DIAGNOSIS — M109 Gout, unspecified: Secondary | ICD-10-CM | POA: Diagnosis present

## 2012-06-02 DIAGNOSIS — Z7982 Long term (current) use of aspirin: Secondary | ICD-10-CM

## 2012-06-02 DIAGNOSIS — F329 Major depressive disorder, single episode, unspecified: Secondary | ICD-10-CM | POA: Diagnosis present

## 2012-06-02 HISTORY — DX: Chronic diastolic (congestive) heart failure: I50.32

## 2012-06-02 HISTORY — DX: Endocarditis, valve unspecified: I38

## 2012-06-02 LAB — COMPREHENSIVE METABOLIC PANEL
AST: 18 U/L (ref 0–37)
Albumin: 3.7 g/dL (ref 3.5–5.2)
Alkaline Phosphatase: 73 U/L (ref 39–117)
BUN: 38 mg/dL — ABNORMAL HIGH (ref 6–23)
Chloride: 101 mEq/L (ref 96–112)
Potassium: 4.8 mEq/L (ref 3.5–5.1)
Total Bilirubin: 0.4 mg/dL (ref 0.3–1.2)

## 2012-06-02 LAB — PROTIME-INR: INR: 1.73 — ABNORMAL HIGH (ref 0.00–1.49)

## 2012-06-02 LAB — URINALYSIS, ROUTINE W REFLEX MICROSCOPIC
Glucose, UA: NEGATIVE mg/dL
Hgb urine dipstick: NEGATIVE
Ketones, ur: NEGATIVE mg/dL
Protein, ur: 30 mg/dL — AB

## 2012-06-02 LAB — TROPONIN I: Troponin I: <0.3 ng/mL (ref <0.30)

## 2012-06-02 LAB — PRO B NATRIURETIC PEPTIDE: Pro B Natriuretic peptide (BNP): 5271 pg/mL — ABNORMAL HIGH (ref 0–450)

## 2012-06-02 LAB — MRSA PCR SCREENING: MRSA by PCR: POSITIVE — AB

## 2012-06-02 LAB — CBC
HCT: 38.8 % (ref 36.0–46.0)
Platelets: 210 10*3/uL (ref 150–400)
RDW: 14.8 % (ref 11.5–15.5)
WBC: 6.3 10*3/uL (ref 4.0–10.5)

## 2012-06-02 LAB — POCT I-STAT TROPONIN I

## 2012-06-02 LAB — URINE MICROSCOPIC-ADD ON

## 2012-06-02 MED ORDER — WARFARIN SODIUM 3 MG PO TABS
3.0000 mg | ORAL_TABLET | Freq: Once | ORAL | Status: AC
  Administered 2012-06-02: 3 mg via ORAL
  Filled 2012-06-02: qty 1

## 2012-06-02 MED ORDER — MAGNESIUM OXIDE 400 MG PO TABS
400.0000 mg | ORAL_TABLET | Freq: Every day | ORAL | Status: DC
  Administered 2012-06-02 – 2012-06-05 (×4): 400 mg via ORAL
  Filled 2012-06-02 (×5): qty 1

## 2012-06-02 MED ORDER — AMLODIPINE BESYLATE 5 MG PO TABS
5.0000 mg | ORAL_TABLET | Freq: Every day | ORAL | Status: DC
  Administered 2012-06-03 – 2012-06-06 (×4): 5 mg via ORAL
  Filled 2012-06-02 (×4): qty 1

## 2012-06-02 MED ORDER — FUROSEMIDE 10 MG/ML IJ SOLN: 40.0000 mg | Freq: Once | INTRAMUSCULAR | Status: DC

## 2012-06-02 MED ORDER — ASPIRIN EC 81 MG PO TBEC
81.0000 mg | DELAYED_RELEASE_TABLET | Freq: Every day | ORAL | Status: DC
  Administered 2012-06-03 – 2012-06-06 (×4): 81 mg via ORAL
  Filled 2012-06-02 (×4): qty 1

## 2012-06-02 MED ORDER — OXYCODONE-ACETAMINOPHEN 5-325 MG PO TABS: 0.5000 | ORAL_TABLET | Freq: Three times a day (TID) | ORAL | Status: DC | PRN

## 2012-06-02 MED ORDER — PANTOPRAZOLE SODIUM 40 MG PO TBEC
40.0000 mg | DELAYED_RELEASE_TABLET | Freq: Two times a day (BID) | ORAL | Status: DC
  Administered 2012-06-02 – 2012-06-06 (×8): 40 mg via ORAL
  Filled 2012-06-02 (×8): qty 1

## 2012-06-02 MED ORDER — ALPRAZOLAM 0.25 MG PO TABS
0.5000 mg | ORAL_TABLET | Freq: Two times a day (BID) | ORAL | Status: DC
  Administered 2012-06-02 – 2012-06-06 (×8): 0.5 mg via ORAL
  Filled 2012-06-02: qty 2
  Filled 2012-06-02 (×2): qty 1
  Filled 2012-06-02 (×3): qty 2
  Filled 2012-06-02: qty 1
  Filled 2012-06-02: qty 2
  Filled 2012-06-02: qty 1
  Filled 2012-06-02: qty 2

## 2012-06-02 MED ORDER — FERROUS SULFATE 325 (65 FE) MG PO TABS
325.0000 mg | ORAL_TABLET | Freq: Two times a day (BID) | ORAL | Status: DC
  Administered 2012-06-03 – 2012-06-06 (×7): 325 mg via ORAL
  Filled 2012-06-02 (×9): qty 1

## 2012-06-02 MED ORDER — ACETAMINOPHEN 325 MG PO TABS
650.0000 mg | ORAL_TABLET | ORAL | Status: DC | PRN
  Administered 2012-06-04: 650 mg via ORAL
  Filled 2012-06-02: qty 2

## 2012-06-02 MED ORDER — ONDANSETRON HCL 4 MG/2ML IJ SOLN: 4.0000 mg | Freq: Four times a day (QID) | INTRAMUSCULAR | Status: DC | PRN

## 2012-06-02 MED ORDER — METOPROLOL TARTRATE 100 MG PO TABS
100.0000 mg | ORAL_TABLET | Freq: Two times a day (BID) | ORAL | Status: DC
  Administered 2012-06-02 – 2012-06-06 (×8): 100 mg via ORAL
  Filled 2012-06-02 (×9): qty 1

## 2012-06-02 MED ORDER — PARICALCITOL 1 MCG PO CAPS
1.0000 ug | ORAL_CAPSULE | Freq: Every day | ORAL | Status: DC
  Administered 2012-06-02 – 2012-06-05 (×4): 1 ug via ORAL
  Filled 2012-06-02 (×5): qty 1

## 2012-06-02 MED ORDER — SPIRONOLACTONE 12.5 MG HALF TABLET
12.5000 mg | ORAL_TABLET | Freq: Every day | ORAL | Status: DC
  Administered 2012-06-03 – 2012-06-06 (×4): 12.5 mg via ORAL
  Filled 2012-06-02 (×4): qty 1

## 2012-06-02 MED ORDER — SODIUM CHLORIDE 0.9 % IJ SOLN: 3.0000 mL | INTRAMUSCULAR | Status: DC | PRN

## 2012-06-02 MED ORDER — ATORVASTATIN CALCIUM 10 MG PO TABS
10.0000 mg | ORAL_TABLET | Freq: Every day | ORAL | Status: DC
  Administered 2012-06-03 – 2012-06-05 (×3): 10 mg via ORAL
  Filled 2012-06-02 (×4): qty 1

## 2012-06-02 MED ORDER — SODIUM CHLORIDE 0.9 % IV SOLN: 250.0000 mL | INTRAVENOUS | Status: DC | PRN

## 2012-06-02 MED ORDER — SODIUM CHLORIDE 0.9 % IJ SOLN
3.0000 mL | Freq: Two times a day (BID) | INTRAMUSCULAR | Status: DC
  Administered 2012-06-02 – 2012-06-06 (×8): 3 mL via INTRAVENOUS

## 2012-06-02 MED ORDER — ISOSORBIDE MONONITRATE ER 30 MG PO TB24
30.0000 mg | ORAL_TABLET | Freq: Every day | ORAL | Status: DC
  Administered 2012-06-03 – 2012-06-06 (×4): 30 mg via ORAL
  Filled 2012-06-02 (×5): qty 1

## 2012-06-02 MED ORDER — WARFARIN - PHARMACIST DOSING INPATIENT
Freq: Every day | Status: DC
  Administered 2012-06-05: 18:00:00

## 2012-06-02 NOTE — ED Notes (Signed)
MD at bedside. 

## 2012-06-02 NOTE — ED Provider Notes (Signed)
History     CSN: [id number]  Arrival date & time 06/02/12  1201   First MD Initiated Contact with Patient 06/02/12 1216      Chief Complaint  Patient presents with  . Chest Pain  . Shortness of Breath    (Consider location/radiation/quality/duration/timing/severity/associated sxs/prior treatment) HPI Comments: Patient is a 77 year old female with a history of CAD, atrial fibrillation, chronic systolic and diastolic heart failure, complete heart block, status post biventricular pacemaker and renal insufficiency that presents emergency department complaining of chest pain.  Onset of symptoms began approximately 10 a.m. this morning while patient was having her morning coffee.  Location was described as substernal and left chest with occasional radiation to the right side of chest and the patient's left neck.  Patient describes the pain as a steady burning pressure like sensention.  Severity at time onset was 10/10, now patient is pain-free.  Since onset patient is taking 2 sublingual nitrates and 324 ASA, CP previously relieved by only 1 nitrate. Associated symptoms include shortness of breath, nausea, PND and DOE.  Patient denies diaphoresis, palpitations, syncope, claudication, cough, fever, hemoptysis, abdominal pain.  Note the patient Lasix and aspirin a lack tone were just decreased by her cardiologist to half the original does do to poor kidney function.  Since this time patient states that she's been struggling with her breathing.  Patient has no other complaints at this time.  Cardiologist: Dr. [name], [facility] 11/24/11   - Left ventricle: The cavity size was normal. Wall thickness was normal. Systolic function was normal. The estimated ejection fraction was in the range of 55% to 60%.  Pacemaker: Dr. Ladona Ridgel, Interrigation scheduled for Wednesday.   The history is provided by the patient.    Past Medical History  Diagnosis Date  . Cardiac pacemaker in situ   . Cellulitis  and abscess of unspecified site     a. h/o MSRA cellulitis of abdominal wall.  Marland Kitchen CAD (coronary artery disease)     a. DES to OM 01/2005. b. Rotational atherotomy/DES to prox LAD 02/2008.  . Edema   . Chronic renal insufficiency     Cr ~2.3.  . Hypertension   . GERD (gastroesophageal reflux disease)   . Depression   . Chronic diastolic CHF (congestive heart failure)     a. EF 55-60% 11/2011, dry weight 200.  . Warfarin anticoagulation     a. Followed by PCP.  Marland Kitchen GI bleed     a. EGD 01/2007: antral ulcers related to NSAID/aspirin use.  . SSS (sick sinus syndrome)     a. H/o AV node ablation 2008 secondary to difficult to control afib/flutter, with subsequent pacer.  . Atrial fibrillation with rapid ventricular response     a. H/o amiodarone thyroiditis. b. s/p AV node ablation 2008 with implantation of PPM 01/2007, upgraded to CRT-P 06/2007.  Marland Kitchen Polycystic kidney disease     a. Per remote E-Chart records.  . Peripheral vascular disease   . Hyperthyroidism     a. Hx of intolerance to PTU.  Marland Kitchen Anemia, unspecified   . Blood transfusion   . H/O hiatal hernia   . Umbilical hernia     unrepaired (11/24/11)  . Migraines 11/24/11    "I've always had them"  . Arthritis   . Gout     "get it in my feet"  . Valvular heart disease     Echo 11/2011: mild-mod MR.    Past Surgical History  Procedure Date  . Esophagogastroduodenoscopy   .  Tonsillectomy ~ 1960  . Coronary angioplasty     percutanous transluminal   . Cardiac catheterization 2006; 2008; 2009; 01/13/2011  . Back surgery   . Laminectomy and microdiscectomy lumbar spine 08/2003  . Cardioversion 10/2004; 02/2007    /E-chart  . Cholecystectomy   . Appendectomy 1954  . Vaginal hysterectomy ?1981  . Av node ablation 02/2007    /E-chart  . Insert / replace / remove pacemaker 01/2007    initial placement  . Insert / replace / remove pacemaker 06/2007    upgraded/E-chart  . Av fistula placement, brachiocephalic 09/2007    right; "didn't  work; my veins were too small"  . Joint replacement   . Total knee arthroplasty 01/2009    left  . Bladder and bowel 1970's    "tacked; damaged my intestines & had to remove some; done @ [facility]"    Family History  Problem Relation Age of Onset  . Colon cancer Neg Hx   . Thyroid disease Neg Hx   . Stroke Brother   . Kidney disease Brother   . Other Mother     Sepsis and perforated colon  . Other Father     Motor vehicle accident    History  Substance Use Topics  . Smoking status: Former Smoker -- 1.0 packs/day for 50 years    Types: Cigarettes    Quit date: 09/15/2004  . Smokeless tobacco: Never Used  . Alcohol Use: No    OB History    Grav Para Term Preterm Abortions TAB SAB Ect Mult Living                  Review of Systems  Constitutional: Negative for fever, chills, diaphoresis, activity change and appetite change.  HENT: Negative for congestion and neck pain.   Eyes: Negative for visual disturbance.  Respiratory: Positive for shortness of breath. Negative for cough and wheezing.   Cardiovascular: Positive for chest pain and leg swelling. Negative for palpitations.  Gastrointestinal: Positive for nausea. Negative for vomiting and abdominal pain.  Genitourinary: Negative for dysuria, urgency and frequency.  Musculoskeletal: Negative for myalgias.  Skin: Negative for color change and wound.  Neurological: Negative for dizziness, syncope, weakness, light-headedness, numbness and headaches.  Psychiatric/Behavioral: Negative for confusion.  All other systems reviewed and are negative.    Allergies  Amiodarone and Propylthiouracil  Home Medications   Current Outpatient Rx  Name Route Sig Dispense Refill  . ALPRAZOLAM 0.5 MG PO TABS Oral Take 0.5 mg by mouth 2 (two) times daily.    Marland Kitchen AMLODIPINE BESYLATE 5 MG PO TABS Oral Take 5 mg by mouth daily.      . ASPIRIN EC 81 MG PO TBEC Oral Take 81 mg by mouth daily.    Marland Kitchen FERROUS SULFATE 325 (65 FE) MG PO TABS  Oral Take 325 mg by mouth 2 (two) times daily after a meal.    . FUROSEMIDE 40 MG PO TABS Oral Take 40 mg by mouth daily.    Marland Kitchen LOVASTATIN 10 MG PO TABS Oral Take 10 mg by mouth at bedtime.      Marland Kitchen MAGNESIUM OXIDE 400 MG PO TABS Oral Take 400 mg by mouth at bedtime.     Marland Kitchen METOPROLOL TARTRATE 100 MG PO TABS Oral Take 100 mg by mouth 2 (two) times daily.    . OXYCODONE-ACETAMINOPHEN 5-325 MG PO TABS Oral Take 0.5-1 tablets by mouth 3 (three) times daily as needed. For pain    . PANTOPRAZOLE  SODIUM 40 MG PO TBEC Oral Take 40 mg by mouth 2 (two) times daily.     Marland Kitchen PARICALCITOL 1 MCG PO CAPS Oral Take 1 mcg by mouth at bedtime.    . SPIRONOLACTONE 25 MG PO TABS Oral Take 12.5 mg by mouth daily.    . WARFARIN SODIUM 2 MG PO TABS Oral Take 2-3 mg by mouth at bedtime. Pt takes 1 & 1/2 tabs on monday, Friday, and 1 tab all other days      BP 156/89  Pulse 62  Temp 98 F (36.7 C) (Oral)  Resp 16  Ht 5\' 7"  (1.702 m)  Wt 205 lb (92.987 kg)  BMI 32.11 kg/m2  SpO2 100%  Physical Exam  Nursing note and vitals reviewed. Constitutional: She appears well-developed and well-nourished. No distress.  HENT:  Head: Normocephalic and atraumatic.  Eyes: Conjunctivae normal and EOM are normal. Pupils are equal, round, and reactive to light.  Neck: Normal range of motion. Neck supple. Normal carotid pulses and no JVD present. Carotid bruit is not present. No rigidity. Normal range of motion present.  Cardiovascular: Normal rate, S1 normal, S2 normal, intact distal pulses and normal pulses.  Exam reveals no gallop and no friction rub.   No murmur heard.      Irregularly irregular. 2+ pitting edema bilaterally, intact distal pulses intact, no carotid bruit or JVD.   Pulmonary/Chest: Effort normal and breath sounds normal. No accessory muscle usage or stridor. No respiratory distress. She exhibits no tenderness and no bony tenderness.       LCAB  Abdominal: Bowel sounds are normal.       Soft non tender. Non  pulsatile aorta.   Skin: Skin is warm, dry and intact. No rash noted. She is not diaphoretic. No cyanosis. Nails show no clubbing.    ED Course  Procedures (including critical care time)  Labs Reviewed  PRO B NATRIURETIC PEPTIDE - Abnormal; Notable for the following:    Pro B Natriuretic peptide (BNP) 5271.0 (*)     All other components within normal limits  COMPREHENSIVE METABOLIC PANEL - Abnormal; Notable for the following:    Sodium 134 (*)     Glucose, Bld 105 (*)     BUN 38 (*)     Creatinine, Ser 2.46 (*)     GFR calc non Af Amer 18 (*)     GFR calc Af Amer 21 (*)     All other components within normal limits  URINALYSIS, ROUTINE W REFLEX MICROSCOPIC - Abnormal; Notable for the following:    Protein, ur 30 (*)     All other components within normal limits  PROTIME-INR - Abnormal; Notable for the following:    Prothrombin Time 19.7 (*)     INR 1.73 (*)     All other components within normal limits  CBC  POCT I-STAT TROPONIN I  URINE MICROSCOPIC-ADD ON   Dg Chest Portable 1 View  06/02/2012  *RADIOLOGY REPORT*  Clinical Data: Chest pain.  Shortness of breath.  PORTABLE CHEST - 1 VIEW  Comparison: 11/23/2011  Findings: Pacer unchanged. Low lung volumes are present, causing crowding of the pulmonary vasculature.  Given the low lung volumes and technique, heart size is considered in the upper normal range. Right hemidiaphragm mildly elevated.  IMPRESSION:  1.  Stable appearance of chest, with mild right hemidiaphragmatic elevation but no acute findings.   Original Report Authenticated By: Dellia Cloud, M.D.      1. Chest pain  Date: 06/02/2012  Rate: 60  Rhythm: atrial fibrillation  QRS Axis: left  Intervals: normal  ST/T Wave abnormalities: nonspecific ST changes  Conduction Disutrbances:none  Narrative Interpretation:   Old EKG Reviewed: changes noted  BP 156/89  Pulse 62  Temp 98 F (36.7 C) (Oral)  Resp 16  Ht 5\' 7"  (1.702 m)  Wt 205 lb (92.987  kg)  BMI 32.11 kg/m2  SpO2 100%   Consult: cardiology, [facility] on call for [facility] M[facility]   Chest pain  77 yo F closely followed by [facility] cardiology presented to ER c/o CP that resolved just prior to arrival, but needing more nitro then usual. Concern for unstable angina. In addition pt had recent decrease in her lasix and spironolactone 2 weeks ago d/t increased crt and has had worsening SOB since. Labs and imaging reviewed. Cardiology consulted for disposition. The patient appears reasonably stabilized considering the current resources, flow, and capabilities available in the ED at this time, and I doubt any other [facility] requiring further screening and/or treatment in the ED prior to admission.-- Pt seen and discussed with Dr. Bebe Shaggy who agrees with above plan       Jaci Carrel, PA-C 06/02/12 1610

## 2012-06-02 NOTE — H&P (Signed)
CARDIOLOGY ADMISSION NOTE  Patient ID: [name], MRN: [id number], DOB/AGE: 20-Mar-1935 77 y.o. Admit date: 06/02/2012   Date of Consult: 06/02/2012 Primary Physician: [name], MD Primary Cardiologist: [name]  Chief Complaint: CP, SOB  HPI: [name] is a 77 y/o F with hx of chronic atrial fibrillation s/p AV node ablation with subsequent CRT-P, chronic diastolic CHF, CAD s/p DES to OM 2006 and to LAD 2009, and chronic renal insufficiency (Cr about 2.3 per Dr. Fabio Bering note). She also has a hx of GIB 2008 with antral ulcer secondary to NSAID use but has been tolerated Coumadin without difficulty.  She has been somewhat depressed lately with her brother's recent death. For the past month, she has been noticing L sided chest pain going across her chest associated with throat/ear discomfort that tends to occur when she begins to get upset or worried. Usually she has been taking 1 SL NTG with full relief of her CP. She has also been experienced worsening dyspnea with exertion lately. She had recent BMET 10/4 demonstrating BUN/Cr 51/2.8, and per the patient was instructed to decrease spironolactone to 12.5mg  daily and Lasix to 40mg  daily. Her chest pain pre-dates this, but she has been experiencing increase in weight up to 205 (dry weight 197 per son), LEE, and abdominal bloating. Today while at rest around 10 am she began to develop her usual chest discomfort, only this time it was not relieved with SL NTG (took 2) so she called 911. She was given 4 baby ASA by EMS with gradual improvement in her pain. She is now pain free.   Labs today: BUN/Cr 38/2.46. pBNP 5271. Troponin neg x 1. CXR: stable without acute findings. VSS except mildly hypertensive 151/69. INR followed by family doctor; is 1.73 today. EKG appears vpaced, underlying afib 60bpm.  Past Medical History  Diagnosis Date  . Cardiac pacemaker in situ   . Cellulitis and abscess of unspecified site     a. h/o MSRA cellulitis of  abdominal wall.  Marland Kitchen CAD (coronary artery disease)     a. DES to OM 01/2005. b. Rotational atherotomy/DES to prox LAD 02/2008.  . Edema   . Chronic renal insufficiency     Cr ~2.3.  . Hypertension   . GERD (gastroesophageal reflux disease)   . Depression   . Chronic diastolic CHF (congestive heart failure)     a. EF 55-60% 11/2011, dry weight 200.  . Warfarin anticoagulation     a. Followed by PCP.  Marland Kitchen GI bleed     a. EGD 01/2007: antral ulcers related to NSAID/aspirin use.  . SSS (sick sinus syndrome)     a. H/o AV node ablation 2008 secondary to difficult to control afib/flutter, with subsequent pacer.  . Atrial fibrillation with rapid ventricular response     a. H/o amiodarone thyroiditis. b. s/p AV node ablation 2008 with implantation of PPM 01/2007, upgraded to CRT-P 06/2007.  Marland Kitchen Polycystic kidney disease     a. Per remote E-Chart records.  . Peripheral vascular disease   . Hyperthyroidism     a. Hx of intolerance to PTU.  Marland Kitchen Anemia, unspecified   . Blood transfusion   . H/O hiatal hernia   . Umbilical hernia     unrepaired (11/24/11)  . Migraines 11/24/11    "I've always had them"  . Arthritis   . Gout     "get it in my feet"  . Valvular heart disease     Echo 11/2011: mild-mod MR.  Most Recent Cardiac Studies: Echo 11/24/11  - Left ventricle: The cavity size was normal. Wall thickness was normal. Systolic function was normal. The estimated ejection fraction was in the range of 55% to 60%. - Mitral valve: Mild to moderate regurgitation. - Left atrium: The atrium was moderately dilated. - Right ventricle: The cavity size was mildly dilated. - Right atrium: The atrium was mildly dilated. - Atrial septum: No defect or patent foramen ovale was identified. - Pulmonary arteries: PA peak pressure: 39mm Hg (S).   Cardiac Cath 12/2010: STUDY FINDINGS: Hemodynamic findings: Left ventricular pressure is  154/19 with a left ventricular end-diastolic pressure of 24 mmHg.  Central  aortic pressure is 155/82 with a mean pressure of 111 mmHg.  Left ventricular angiography was not performed due to chronic kidney  disease.  CORONARY ANGIOGRAPHY:  Left main coronary artery: The vessel was  overall short and free of any significant disease.  Left anterior descending artery: The vessel is noted to be heavily  calcified. There is a stent in the proximal segment which is patent  with minimal restenosis. In the proximal segment distal to the stent,  there is 30% stenosis. The mid LAD has another 30% stenosis after the  first septal. The rest of the LAD has mild atherosclerosis without  obstructive disease. First diagonal is small in size and free of  significant disease. Second diagonal is normal in size with mild ostial  disease. Third diagonal is medium in size and free of significant  disease. First septal branch is a medium size branch with an 80% ostial  stenosis.  Left circumflex artery: The vessel is medium in size and nondominant.It is moderately calcified. A stent is noted in OM-1, which has diffuse  10-20% in-stent restenosis. The mid circumflex has a 20% stenosis, but  otherwise no other obstructive disease.  Right coronary artery: The vessel is heavily calcified. Proximally,  there was a diffuse 30-40% disease. In the mid segment, there is 50-60%  stenosis, which does not seem to be significantly changed from previous  cardiac catheterization. The rest of the right coronary artery is free  of significant disease.  STUDY CONCLUSIONS:  1. No evidence of obstructive coronary artery disease.  2. Patent stents in the left anterior descending artery as well as OM-  1. There is moderate mid RCA stenosis, which does not seem to be  significantly different from most recent cardiac catheterization.  3. Moderate hypertension and elevated left ventricular end-diastolic  pressure.  RECOMMENDATIONS: Recommend continuing medical therapy for coronary  artery disease. We will  hydrate the patient after the procedure. Only  28 mL of contrast was used.   Surgical History:  Past Surgical History  Procedure Date  . Esophagogastroduodenoscopy   . Tonsillectomy ~ 1960  . Coronary angioplasty     percutanous transluminal   . Cardiac catheterization 2006; 2008; 2009; 01/13/2011  . Back surgery   . Laminectomy and microdiscectomy lumbar spine 08/2003  . Cardioversion 10/2004; 02/2007    /E-chart  . Cholecystectomy   . Appendectomy 1954  . Vaginal hysterectomy ?1981  . Av node ablation 02/2007    /E-chart  . Insert / replace / remove pacemaker 01/2007    initial placement  . Insert / replace / remove pacemaker 06/2007    upgraded/E-chart  . Av fistula placement, brachiocephalic 09/2007    right; "didn't work; my veins were too small"  . Joint replacement   . Total knee arthroplasty 01/2009    left  .  Bladder and bowel 1970's    "tacked; damaged my intestines & had to remove some; done @ [facility]"     Home Meds: Prior to Admission medications   Medication Sig Start Date End Date Taking? Authorizing Provider  ALPRAZolam Prudy Feeler) 0.5 MG tablet Take 0.5 mg by mouth 2 (two) times daily.   Yes Historical Provider, MD  amLODipine (NORVASC) 5 MG tablet Take 5 mg by mouth daily.     Yes Historical Provider, MD  aspirin EC 81 MG tablet Take 81 mg by mouth daily.   Yes Historical Provider, MD  ferrous sulfate 325 (65 FE) MG tablet Take 325 mg by mouth 2 (two) times daily after a meal.   Yes Historical Provider, MD  furosemide (LASIX) 40 MG tablet Take 40 mg by mouth daily.   Yes Historical Provider, MD  lovastatin (MEVACOR) 10 MG tablet Take 10 mg by mouth at bedtime.     Yes Historical Provider, MD  magnesium oxide (MAG-OX) 400 MG tablet Take 400 mg by mouth at bedtime.    Yes Historical Provider, MD  metoprolol (LOPRESSOR) 100 MG tablet Take 100 mg by mouth 2 (two) times daily.   Yes Historical Provider, MD  oxyCODONE-acetaminophen (PERCOCET) 5-325 MG per tablet Take  0.5-1 tablets by mouth 3 (three) times daily as needed. For pain   Yes Historical Provider, MD  pantoprazole (PROTONIX) 40 MG tablet Take 40 mg by mouth 2 (two) times daily.    Yes Historical Provider, MD  paricalcitol (ZEMPLAR) 1 MCG capsule Take 1 mcg by mouth at bedtime.   Yes Historical Provider, MD  spironolactone (ALDACTONE) 25 MG tablet Take 12.5 mg by mouth daily.   Yes Historical Provider, MD  warfarin (COUMADIN) 2 MG tablet Take 2-3 mg by mouth at bedtime. Pt takes 1 & 1/2 tabs on monday, Friday, and 1 tab all other days   Yes Historical Provider, MD    Inpatient Medications:     Allergies:  Allergies  Allergen Reactions  . Amiodarone Hives  . Propylthiouracil Rash    History   Social History  . Marital Status: Widowed    Spouse Name: N/A    Number of Children: N/A  . Years of Education: N/A   Occupational History  . Not on file.   Social History Main Topics  . Smoking status: Former Smoker -- 1.0 packs/day for 50 years    Types: Cigarettes    Quit date: 09/15/2004  . Smokeless tobacco: Never Used  . Alcohol Use: No  . Drug Use: No  . Sexually Active: No   Other Topics Concern  . Not on file   Social History Narrative  . No narrative on file     Family History  Problem Relation Age of Onset  . Colon cancer Neg Hx   . Thyroid disease Neg Hx   . Stroke Brother   . Kidney disease Brother   . Other Mother     Sepsis and perforated colon  . Other Father     Motor vehicle accident     Review of Systems: General: negative for chills, fever, night sweats or weight changes.  Cardiovascular: see above Dermatological: negative for rash Respiratory: negative for cough or wheezing Urologic: negative for hematuria Abdominal: has had some nausea. No vomiting. diarrhea, bright red blood per rectum, melena, or hematemesis Neurologic: negative for visual changes, syncope, or dizziness Psych: recent depression. Sometimes cries at night when thinking of losing  her brother. Strong willed woman - grandson  states "she's a true Eiben - we tend to keep it all to ourselves" - pt corroborates this. All other systems reviewed and are otherwise negative except as noted above.  Labs: poc troponin negative Lab Results  Component Value Date   WBC 6.3 06/02/2012   HGB 12.9 06/02/2012   HCT 38.8 06/02/2012   MCV 85.5 06/02/2012   PLT 210 06/02/2012     Lab 06/02/12 1335  NA 134*  K 4.8  CL 101  CO2 21  BUN 38*  CREATININE 2.46*  CALCIUM 9.3  PROT 7.7  BILITOT 0.4  ALKPHOS 73  ALT 15  AST 18  GLUCOSE 105*   Radiology/Studies:  Dg Chest Portable 1 View 06/02/2012  *RADIOLOGY REPORT*  Clinical Data: Chest pain.  Shortness of breath.  PORTABLE CHEST - 1 VIEW  Comparison: 11/23/2011  Findings: Pacer unchanged. Low lung volumes are present, causing crowding of the pulmonary vasculature.  Given the low lung volumes and technique, heart size is considered in the upper normal range. Right hemidiaphragm mildly elevated.  IMPRESSION:  1.  Stable appearance of chest, with mild right hemidiaphragmatic elevation but no acute findings.   Original Report Authenticated By: Dellia Cloud, M.D.    EKG: appears vpaced, underlying afib 60bpm  Physical Exam: Blood pressure 156/89, pulse 62, temperature 98 F (36.7 C), temperature source Oral, resp. rate 16, height 5\' 7"  (1.702 m), weight 205 lb (92.987 kg), SpO2 100.00%. General: Well developed, well nourished WF in no acute distress. Head: Normocephalic, atraumatic, sclera non-icteric, no xanthomas, nares are without discharge.  Neck: Negative for carotid bruits. JVD not elevated. Lungs: Coarse BS, faint rales particularly at bases. No wheezes or rhonchi. Breathing is unlabored. Heart: RRR with S1 S2. No murmurs, rubs, or gallops appreciated. Abdomen: Soft, non-tender, protuberant with normoactive bowel sounds. No hepatomegaly. No rebound/guarding. No obvious abdominal masses. Msk:  Strength and tone  appear normal for age. Extremities: No clubbing or cyanosis. 1+ edema.  Distal pedal pulses are 2+ and equal bilaterally. Neuro: Alert and oriented X 3. No facial asymmetry. No focal deficit. Moves all extremities spontaneously. Psych:  Responds to questions appropriately with a normal affect.   Assessment and Plan:   1. Unstable angina with history of CAD s/p DES-OM 2006, DES-LAD 2009 2. Acute on chronic diastolic CHF 3. Recent depression 4. Chronic renal insufficiency, stage IV 5. Chronic afib s/p AV node ablation s/p CRT-P  Difficult situation in light of renal disease. She is definitely volume overloaded on exam but diuresis may be limited by renal function. She is also not an ideal candidate for cath (GFR 18) and thus would attempt medical therapy first. See below for comprehensive recommendations after discussion with Dr. Patty Sermons. She may benefit from social work eval regarding referral outpatient grief counseling as well. The patient does report she has a good support system and is in good spirits today.  Signed, Dayna Dunn PA-C 06/02/2012, 3:07 PM Seen in ER.  Agree with assessment and plans as outlined above.   Exam reveals evidence of definite fluid overload with crackles at lung bases, increased abdominal girth and mild new pedal edema. Weight is up about 7 lb and began when diuretic dose was reduced several weeks ago because of renal concerns. She has had symptoms consistent with angina. Plan will be to optimize cardiac med regimen and hope not to cath in view of her renal disease.  Continue her warfarin per pharmacy protocol. Follow BMETs and weights closely as we seek best balance  between treating her diastolic HF and preserving kidney function. Will add imdur.

## 2012-06-02 NOTE — Progress Notes (Signed)
ANTICOAGULATION CONSULT NOTE - Initial Consult  Pharmacy Consult for Coumadin Indication: atrial fibrillation  Allergies  Allergen Reactions  . Amiodarone Hives  . Propylthiouracil Rash    Patient Measurements: Height: 5\' 7"  (170.2 cm) Weight: 205 lb (92.987 kg) IBW/kg (Calculated) : 61.6   Vital Signs: Temp: 98 F (36.7 C) (10/19 1209) Temp src: Oral (10/19 1209) BP: 156/89 mmHg (10/19 1444) Pulse Rate: 62  (10/19 1444)  Labs:  Basename 06/02/12 1432 06/02/12 1335  HGB -- 12.9  HCT -- 38.8  PLT -- 210  APTT -- --  LABPROT 19.7* --  INR 1.73* --  HEPARINUNFRC -- --  CREATININE -- 2.46*  CKTOTAL -- --  CKMB -- --  TROPONINI -- --    Estimated Creatinine Clearance: 23.1 ml/min (by C-G formula based on Cr of 2.46).   Medical History: Past Medical History  Diagnosis Date  . Cardiac pacemaker in situ   . Cellulitis and abscess of unspecified site     a. h/o MSRA cellulitis of abdominal wall.  Marland Kitchen CAD (coronary artery disease)     a. DES to OM 01/2005. b. Rotational atherotomy/DES to prox LAD 02/2008.  . Edema   . Chronic renal insufficiency     Cr ~2.3.  . Hypertension   . GERD (gastroesophageal reflux disease)   . Depression   . Chronic diastolic CHF (congestive heart failure)     a. EF 55-60% 11/2011, dry weight 200.  . Warfarin anticoagulation     a. Followed by PCP.  Marland Kitchen GI bleed     a. EGD 01/2007: antral ulcers related to NSAID/aspirin use.  . SSS (sick sinus syndrome)     a. H/o AV node ablation 2008 secondary to difficult to control afib/flutter, with subsequent pacer.  . Atrial fibrillation with rapid ventricular response     a. H/o amiodarone thyroiditis. b. s/p AV node ablation 2008 with implantation of PPM 01/2007, upgraded to CRT-P 06/2007.  Marland Kitchen Polycystic kidney disease     a. Per remote E-Chart records.  . Peripheral vascular disease   . Hyperthyroidism     a. Hx of intolerance to PTU.  Marland Kitchen Anemia, unspecified   . Blood transfusion   . H/O hiatal  hernia   . Umbilical hernia     unrepaired (11/24/11)  . Migraines 11/24/11    "I've always had them"  . Arthritis   . Gout     "get it in my feet"  . Valvular heart disease     Echo 11/2011: mild-mod MR.    Medications:  See PTA medication list  Assessment: 77 yo female who presents to the ED with chest pain. Patient takes chronic Coumadin at home for Afib. Pharmacy consulted to manage while inpatient. INR is subtherapeutic and patient has not had her dose today.  Home dose: 2 mg daily except 3 mg on Mon, Fri  Goal of Therapy:  INR 2-3   Plan:  -Coumadin 3 mg po tonight -INR daily  [facility], [address].D., BCPS Clinical Pharmacist Pager: [phone] 06/02/2012 5:04 PM

## 2012-06-02 NOTE — ED Notes (Signed)
Pt presents with a sudden onset of CP midsternal none radiating. No relief with 2 nitro. Pt received 324 ASA MG by EMS

## 2012-06-03 DIAGNOSIS — I5043 Acute on chronic combined systolic (congestive) and diastolic (congestive) heart failure: Secondary | ICD-10-CM

## 2012-06-03 DIAGNOSIS — I4891 Unspecified atrial fibrillation: Secondary | ICD-10-CM

## 2012-06-03 DIAGNOSIS — I509 Heart failure, unspecified: Secondary | ICD-10-CM

## 2012-06-03 LAB — BASIC METABOLIC PANEL
BUN: 38 mg/dL — ABNORMAL HIGH (ref 6–23)
CO2: 24 mEq/L (ref 19–32)
Calcium: 9.3 mg/dL (ref 8.4–10.5)
Calcium: 9.5 mg/dL (ref 8.4–10.5)
Chloride: 102 mEq/L (ref 96–112)
Creatinine, Ser: 2.59 mg/dL — ABNORMAL HIGH (ref 0.50–1.10)
GFR calc non Af Amer: 17 mL/min — ABNORMAL LOW (ref >90)
Glucose, Bld: 89 mg/dL (ref 70–99)
Sodium: 136 mEq/L (ref 135–145)

## 2012-06-03 MED ORDER — CHLORHEXIDINE GLUCONATE CLOTH 2 % EX PADS
6.0000 | MEDICATED_PAD | Freq: Every day | CUTANEOUS | Status: DC
Start: 2012-06-03 — End: 2012-06-06
  Administered 2012-06-03 – 2012-06-06 (×4): 6 via TOPICAL

## 2012-06-03 MED ORDER — FUROSEMIDE 10 MG/ML IJ SOLN
80.0000 mg | Freq: Once | INTRAMUSCULAR | Status: AC
  Administered 2012-06-03: 80 mg via INTRAVENOUS
  Filled 2012-06-03: qty 8

## 2012-06-03 MED ORDER — WARFARIN SODIUM 3 MG PO TABS
3.0000 mg | ORAL_TABLET | Freq: Once | ORAL | Status: AC
  Administered 2012-06-03: 3 mg via ORAL
  Filled 2012-06-03: qty 1

## 2012-06-03 MED ORDER — MUPIROCIN 2 % EX OINT
1.0000 "application " | TOPICAL_OINTMENT | Freq: Two times a day (BID) | CUTANEOUS | Status: DC
  Administered 2012-06-03 – 2012-06-06 (×8): 1 via NASAL
  Filled 2012-06-03: qty 22

## 2012-06-03 NOTE — Progress Notes (Addendum)
Subjective: Breathing is better.  No CP  Skin is dry. Objective: Filed Vitals:   06/02/12 1444 06/02/12 1839 06/02/12 2056 06/03/12 0617  BP: 156/89 150/83 129/82 144/74  Pulse: 62 64 60 71  Temp:  97.7 F (36.5 C) 97.5 F (36.4 C) 97.3 F (36.3 C)  TempSrc:  Oral Oral Oral  Resp: 16 19 20 19   Height:      Weight:      SpO2: 100% 98% 96% 98%   Weight change:  No intake or output data in the 24 hours ending 06/03/12 0957  General: Alert, awake, oriented x3, in no acute distress Neck:  JVP is increased Heart: Regular rate and rhythm, without murmurs, rubs, gallops.  Lungs: Clear to auscultation.  Occasional rale Exemities:  Tr to 1+ edema.   Neuro: Grossly intact, nonfocal.  Tele:  PAced  Lab Results: Results for orders placed during the hospital encounter of 06/02/12 (from the past 24 hour(s))  PRO B NATRIURETIC PEPTIDE     Status: Abnormal   Collection Time   06/02/12  1:01 PM      Component Value Range   Pro B Natriuretic peptide (BNP) 5271.0 (*) 0 - 450 pg/mL  POCT I-STAT TROPONIN I     Status: Normal   Collection Time   06/02/12  1:28 PM      Component Value Range   Troponin i, poc 0.00  0.00 - 0.08 ng/mL   Comment 3           CBC     Status: Normal   Collection Time   06/02/12  1:35 PM      Component Value Range   WBC 6.3  4.0 - 10.5 K/uL   RBC 4.54  3.87 - 5.11 MIL/uL   Hemoglobin 12.9  12.0 - 15.0 g/dL   HCT 16.1  09.6 - 04.5 %   MCV 85.5  78.0 - 100.0 fL   MCH 28.4  26.0 - 34.0 pg   MCHC 33.2  30.0 - 36.0 g/dL   RDW 40.9  81.1 - 91.4 %   Platelets 210  150 - 400 K/uL  COMPREHENSIVE METABOLIC PANEL     Status: Abnormal   Collection Time   06/02/12  1:35 PM      Component Value Range   Sodium 134 (*) 135 - 145 mEq/L   Potassium 4.8  3.5 - 5.1 mEq/L   Chloride 101  96 - 112 mEq/L   CO2 21  19 - 32 mEq/L   Glucose, Bld 105 (*) 70 - 99 mg/dL   BUN 38 (*) 6 - 23 mg/dL   Creatinine, Ser 7.82 (*) 0.50 - 1.10 mg/dL   Calcium 9.3  8.4 - 95.6 mg/dL   Total Protein 7.7  6.0 - 8.3 g/dL   Albumin 3.7  3.5 - 5.2 g/dL   AST 18  0 - 37 U/L   ALT 15  0 - 35 U/L   Alkaline Phosphatase 73  39 - 117 U/L   Total Bilirubin 0.4  0.3 - 1.2 mg/dL   GFR calc non Af Amer 18 (*) >90 mL/min   GFR calc Af Amer 21 (*) >90 mL/min  PROTIME-INR     Status: Abnormal   Collection Time   06/02/12  2:32 PM      Component Value Range   Prothrombin Time 19.7 (*) 11.6 - 15.2 seconds   INR 1.73 (*) 0.00 - 1.49  URINALYSIS, ROUTINE W REFLEX MICROSCOPIC  Status: Abnormal   Collection Time   06/02/12  2:42 PM      Component Value Range   Color, Urine YELLOW  YELLOW   APPearance CLEAR  CLEAR   Specific Gravity, Urine 1.010  1.005 - 1.030   pH 6.5  5.0 - 8.0   Glucose, UA NEGATIVE  NEGATIVE mg/dL   Hgb urine dipstick NEGATIVE  NEGATIVE   Bilirubin Urine NEGATIVE  NEGATIVE   Ketones, ur NEGATIVE  NEGATIVE mg/dL   Protein, ur 30 (*) NEGATIVE mg/dL   Urobilinogen, UA 0.2  0.0 - 1.0 mg/dL   Nitrite NEGATIVE  NEGATIVE   Leukocytes, UA NEGATIVE  NEGATIVE  URINE MICROSCOPIC-ADD ON     Status: Normal   Collection Time   06/02/12  2:42 PM      Component Value Range   Squamous Epithelial / LPF RARE  RARE  MAGNESIUM     Status: Normal   Collection Time   06/02/12  6:48 PM      Component Value Range   Magnesium 2.2  1.5 - 2.5 mg/dL  TROPONIN I     Status: Normal   Collection Time   06/02/12  6:48 PM      Component Value Range   Troponin I <0.30  <0.30 ng/mL  MRSA PCR SCREENING     Status: Abnormal   Collection Time   06/02/12  6:52 PM      Component Value Range   MRSA by PCR POSITIVE (*) NEGATIVE  TROPONIN I     Status: Normal   Collection Time   06/02/12 11:32 PM      Component Value Range   Troponin I <0.30  <0.30 ng/mL  PROTIME-INR     Status: Abnormal   Collection Time   06/03/12  5:00 AM      Component Value Range   Prothrombin Time 21.3 (*) 11.6 - 15.2 seconds   INR 1.93 (*) 0.00 - 1.49  BASIC METABOLIC PANEL     Status: Abnormal    Collection Time   06/03/12  5:00 AM      Component Value Range   Sodium 137  135 - 145 mEq/L   Potassium 4.1  3.5 - 5.1 mEq/L   Chloride 103  96 - 112 mEq/L   CO2 25  19 - 32 mEq/L   Glucose, Bld 89  70 - 99 mg/dL   BUN 38 (*) 6 - 23 mg/dL   Creatinine, Ser 3.08 (*) 0.50 - 1.10 mg/dL   Calcium 9.3  8.4 - 65.7 mg/dL   GFR calc non Af Amer 17 (*) >90 mL/min   GFR calc Af Amer 20 (*) >90 mL/min  TROPONIN I     Status: Normal   Collection Time   06/03/12  5:00 AM      Component Value Range   Troponin I <0.30  <0.30 ng/mL    Studies/Results: @RISRSLT24 @  Medications: Reviewed   Patient Active Hospital Problem List:  1,  CP  Enzymes are negative so far  WOuld continue Rx for CHF  2.  CAD.  S/p DES to OM in 2006 and to LAD in 2009  As noted above.  3.  CHF  Diastolic  LVEF is APril 55 to 84%  Will diurese with lasix  Watch renal function.  Patient would like foley catheter  4  Renal  Will need to follow  Baseline Cr about 2.3.    5.  HTN  BP is a little better today.  5.  Afib  S/p AVN ablation and CRT P  Continue coumadin     LOS: 1 day   [name] 06/03/2012, 9:57 AM

## 2012-06-03 NOTE — ED Provider Notes (Signed)
Medical screening examination/treatment/procedure(s) were conducted as a shared visit with non-physician practitioner(s) and myself.  I personally evaluated the patient during the encounter  EKG reviewed Pt seen/examined and her CP is improved on my evaluation Seen by cardiology who recommend admit Stable in ED   Joya Gaskins, MD 06/03/12 [phone]

## 2012-06-03 NOTE — Progress Notes (Signed)
CRITICAL VALUE ALERT  Critical value received: positive MRSA  Date of notification:  06/01/12  Time of notification:  2200  Critical value read back:no  Nurse who received call: Bernadene Person  MD notified (1st page):    Time of first page:    MD notified (2nd page):  Time of second page:  Responding MD:   Time MD responded:

## 2012-06-03 NOTE — Progress Notes (Signed)
ANTICOAGULATION CONSULT NOTE   Pharmacy Consult for Coumadin Indication: atrial fibrillation  Allergies  Allergen Reactions  . Amiodarone Hives  . Propylthiouracil Rash    Patient Measurements: Height: 5\' 7"  (170.2 cm) Weight: 205 lb (92.987 kg) IBW/kg (Calculated) : 61.6   Vital Signs: Temp: 97.3 F (36.3 C) (10/20 0617) Temp src: Oral (10/20 0617) BP: 138/79 mmHg (10/20 1045) Pulse Rate: 61  (10/20 1045)  Labs:  Basename 06/03/12 0500 06/02/12 2332 06/02/12 1848 06/02/12 1432 06/02/12 1335  HGB -- -- -- -- 12.9  HCT -- -- -- -- 38.8  PLT -- -- -- -- 210  APTT -- -- -- -- --  LABPROT 21.3* -- -- 19.7* --  INR 1.93* -- -- 1.73* --  HEPARINUNFRC -- -- -- -- --  CREATININE 2.59* -- -- -- 2.46*  CKTOTAL -- -- -- -- --  CKMB -- -- -- -- --  TROPONINI <0.30 <0.30 <0.30 -- --    Estimated Creatinine Clearance: 22 ml/min (by C-G formula based on Cr of 2.59).   Medical History: Past Medical History  Diagnosis Date  . Cardiac pacemaker in situ   . Cellulitis and abscess of unspecified site     a. h/o MSRA cellulitis of abdominal wall.  Marland Kitchen CAD (coronary artery disease)     a. DES to OM 01/2005. b. Rotational atherotomy/DES to prox LAD 02/2008.  . Edema   . Chronic renal insufficiency     Cr ~2.3.  . Hypertension   . GERD (gastroesophageal reflux disease)   . Depression   . Chronic diastolic CHF (congestive heart failure)     a. EF 55-60% 11/2011, dry weight 200.  . Warfarin anticoagulation     a. Followed by PCP.  Marland Kitchen GI bleed     a. EGD 01/2007: antral ulcers related to NSAID/aspirin use.  . SSS (sick sinus syndrome)     a. H/o AV node ablation 2008 secondary to difficult to control afib/flutter, with subsequent pacer.  . Atrial fibrillation with rapid ventricular response     a. H/o amiodarone thyroiditis. b. s/p AV node ablation 2008 with implantation of PPM 01/2007, upgraded to CRT-P 06/2007.  Marland Kitchen Polycystic kidney disease     a. Per remote E-Chart records.  .  Peripheral vascular disease   . Hyperthyroidism     a. Hx of intolerance to PTU.  Marland Kitchen Anemia, unspecified   . Blood transfusion   . H/O hiatal hernia   . Umbilical hernia     unrepaired (11/24/11)  . Migraines 11/24/11    "I've always had them"  . Arthritis   . Gout     "get it in my feet"  . Valvular heart disease     Echo 11/2011: mild-mod MR.    Medications:  See PTA medication list  Assessment: 77 yo female who presents to the ED with chest pain. Patient takes chronic Coumadin at home for Afib. Pharmacy consulted to manage while inpatient. INR 1.93 today  Home dose: 2 mg daily except 3 mg on Mon, Fri  Goal of Therapy:  INR 2-3   Plan:  -Coumadin 3 mg po tonight -INR daily  Takelia Urieta, Pharm.D. Clinical Pharmacist Pager: [phone] 06/03/2012 11:37 AM

## 2012-06-04 DIAGNOSIS — I509 Heart failure, unspecified: Secondary | ICD-10-CM

## 2012-06-04 DIAGNOSIS — R079 Chest pain, unspecified: Secondary | ICD-10-CM

## 2012-06-04 DIAGNOSIS — I2510 Atherosclerotic heart disease of native coronary artery without angina pectoris: Secondary | ICD-10-CM

## 2012-06-04 LAB — PRO B NATRIURETIC PEPTIDE: Pro B Natriuretic peptide (BNP): 6449 pg/mL — ABNORMAL HIGH (ref 0–450)

## 2012-06-04 LAB — BASIC METABOLIC PANEL
GFR calc non Af Amer: 16 mL/min — ABNORMAL LOW (ref >90)
Glucose, Bld: 100 mg/dL — ABNORMAL HIGH (ref 70–99)
Potassium: 5.3 mEq/L — ABNORMAL HIGH (ref 3.5–5.1)
Sodium: 133 mEq/L — ABNORMAL LOW (ref 135–145)

## 2012-06-04 LAB — PROTIME-INR: INR: 2.04 — ABNORMAL HIGH (ref 0.00–1.49)

## 2012-06-04 MED ORDER — FUROSEMIDE 10 MG/ML IJ SOLN
80.0000 mg | Freq: Once | INTRAMUSCULAR | Status: AC
  Administered 2012-06-04: 80 mg via INTRAVENOUS

## 2012-06-04 MED ORDER — WARFARIN SODIUM 2 MG PO TABS
2.0000 mg | ORAL_TABLET | Freq: Once | ORAL | Status: AC
  Administered 2012-06-04: 2 mg via ORAL
  Filled 2012-06-04: qty 1

## 2012-06-04 NOTE — Progress Notes (Signed)
ANTICOAGULATION CONSULT NOTE   Pharmacy Consult for Coumadin Indication: atrial fibrillation  Allergies  Allergen Reactions  . Amiodarone Hives  . Propylthiouracil Rash   Labs:  Basename 06/04/12 0516 06/03/12 1100 06/03/12 0500 06/02/12 2332 06/02/12 1848 06/02/12 1432 06/02/12 1335  HGB -- -- -- -- -- -- 12.9  HCT -- -- -- -- -- -- 38.8  PLT -- -- -- -- -- -- 210  APTT -- -- -- -- -- -- --  LABPROT 22.2* -- 21.3* -- -- 19.7* --  INR 2.04* -- 1.93* -- -- 1.73* --  HEPARINUNFRC -- -- -- -- -- -- --  CREATININE 2.69* 2.46* 2.59* -- -- -- --  CKTOTAL -- -- -- -- -- -- --  CKMB -- -- -- -- -- -- --  TROPONINI -- -- <0.30 <0.30 <0.30 -- --    Estimated Creatinine Clearance: 21 ml/min (by C-G formula based on Cr of 2.69).  Assessment: 77 yo female who presents to the ED with chest pain. Patient takes chronic Coumadin at home for Afib.  INR today = 2.04  Home dose: 2 mg daily except 3 mg on Mon, Fri  Goal of Therapy:  INR 2-3   Plan:  -Coumadin 2 mg po tonight -INR daily  Okey Regal, PharmD [phone]  06/04/2012 9:29 AM

## 2012-06-04 NOTE — Progress Notes (Signed)
Subjective: No Chest pressure  No SOB Objective: Filed Vitals:   06/03/12 1045 06/03/12 1300 06/03/12 2030 06/04/12 0510  BP: 138/79 135/77 136/72 136/94  Pulse: 61 65 82 85  Temp:  97.2 F (36.2 C) 97.1 F (36.2 C) 97.5 F (36.4 C)  TempSrc:  Oral Oral Oral  Resp:  20 20 19   Height:      Weight:    202 lb 9.6 oz (91.899 kg)  SpO2:  96% 96% 96%   Weight change: -2 lb 6.4 oz (-1.089 kg)  Intake/Output Summary (Last 24 hours) at 06/04/12 0738 Last data filed at 06/04/12 0511  Gross per 24 hour  Intake    963 ml  Output   3175 ml  Net  -2212 ml    General: Alert, awake, oriented x3, in no acute distress Neck:  JVP is difficult to assess, neck full. Heart: Regular rate and rhythm, without murmurs, rubs, gallops.  Lungs: Clear to auscultation.  No rales or wheezes. Exemities:  Tr edema.   Neuro: Grossly intact, nonfocal.  Tele:  Paced.  afib. Lab Results: Results for orders placed during the hospital encounter of 06/02/12 (from the past 24 hour(s))  BASIC METABOLIC PANEL     Status: Abnormal   Collection Time   06/03/12 11:00 AM      Component Value Range   Sodium 136  135 - 145 mEq/L   Potassium 4.4  3.5 - 5.1 mEq/L   Chloride 102  96 - 112 mEq/L   CO2 24  19 - 32 mEq/L   Glucose, Bld 117 (*) 70 - 99 mg/dL   BUN 38 (*) 6 - 23 mg/dL   Creatinine, Ser 4.09 (*) 0.50 - 1.10 mg/dL   Calcium 9.5  8.4 - 81.1 mg/dL   GFR calc non Af Amer 18 (*) >90 mL/min   GFR calc Af Amer 21 (*) >90 mL/min  BASIC METABOLIC PANEL     Status: Abnormal   Collection Time   06/04/12  5:16 AM      Component Value Range   Sodium 133 (*) 135 - 145 mEq/L   Potassium 5.3 (*) 3.5 - 5.1 mEq/L   Chloride 99  96 - 112 mEq/L   CO2 23  19 - 32 mEq/L   Glucose, Bld 100 (*) 70 - 99 mg/dL   BUN 40 (*) 6 - 23 mg/dL   Creatinine, Ser 9.14 (*) 0.50 - 1.10 mg/dL   Calcium 9.5  8.4 - 78.2 mg/dL   GFR calc non Af Amer 16 (*) >90 mL/min   GFR calc Af Amer 19 (*) >90 mL/min  PROTIME-INR     Status:  Abnormal   Collection Time   06/04/12  5:16 AM      Component Value Range   Prothrombin Time 22.2 (*) 11.6 - 15.2 seconds   INR 2.04 (*) 0.00 - 1.49    Studies/Results: @RISRSLT24 @  Medications: Reviewed   Patient Active Hospital Problem List: No active hospital problems. 1, CP R/O for MI  Prob represents fluid overload. 2. CAD. S/p DES to OM in 2006 and to LAD in 2009 As noted above.  3. CHF Diastolic LVEF is APril 55 to 60%  Wt is still up from baseline. BNP actually higher I would recomm another dose of IV lasix.  Follow output.  Her volume status is very fragile. She is symptomatic with chest pressure with not much extra volume. 4 Renal Cr has been 2.3 to 2.8 over past year.  Would  follow 5. HTN Fair control. 5. Afib S/p AVN ablation and CRT P Continue coumadin    LOS: 2 days   Dietrich Pates 06/04/2012, 7:38 AM

## 2012-06-04 NOTE — Clinical Documentation Improvement (Signed)
CKD DOCUMENTATION CLARIFICATION QUERY   THIS DOCUMENT IS NOT A PERMANENT PART OF THE MEDICAL RECORD  Please update your documentation within the medical record to reflect your response to this query.                                                                                        06/04/12   Dr. Tenny Craw and/Associates,  In a better effort to capture your patient's severity of illness, reflect appropriate length of stay and utilization of resources, a review of the patient medical record has revealed the following indicators:  "Chronic renal insufficiency, stage IV" H&P   06/02/12     Based on your clinical judgment, please clarify and document in the progress notes and discharge summary if a condition below provides greater specificity regarding the patient's "chronic renal insufficiency, stage IV":   - Chronic Kidney Disease Stage I -  GFR > OR = 90  - Chronic Kidney Disease Stage II - GFR 60-89  - Chronic Kidney Disease Stage III - GFR 30-59  - Chronic Kidney Disease Stage IV - GFR 15-29  - Chronic Kidney Disease Stage V - GFR < 15  - ESRD (End Stage Renal Disease)  - Other Condition  - Unable to Clinically determine    In responding to this query please exercise your independent judgment.    The fact that a query is asked, does not imply that any particular answer is desired or expected.  Reviewed: 06/06/12 - stage 3 ckd documented by dr. Riley Kill in pn.  Mathis Dad RN   Thank You,  Jerral Ralph  RN BSN CCDS Certified Clinical Documentation Specialist: Cell   [phone]  Health Information Management [place]  TO RESPOND TO THE THIS QUERY, FOLLOW THE INSTRUCTIONS BELOW:  1. If needed, update documentation for the patient's encounter via the notes activity.  2. Access this query again and click edit on the In [employer].  3. After updating, or not, click F2 to complete all highlighted (required) fields concerning your review. Select "additional  documentation in the medical record" OR "no additional documentation provided".  4. Click Sign note button.  5. The deficiency will fall out of your In Basket *Please let us know if you are not able to complete this workflow by phone or e-mail (listed below).

## 2012-06-04 NOTE — Progress Notes (Signed)
Utilization Review Completed.  

## 2012-06-05 ENCOUNTER — Inpatient Hospital Stay (HOSPITAL_COMMUNITY): Payer: Medicare Other

## 2012-06-05 DIAGNOSIS — N189 Chronic kidney disease, unspecified: Secondary | ICD-10-CM

## 2012-06-05 DIAGNOSIS — N179 Acute kidney failure, unspecified: Secondary | ICD-10-CM

## 2012-06-05 LAB — BASIC METABOLIC PANEL
CO2: 25 mEq/L (ref 19–32)
Calcium: 9.2 mg/dL (ref 8.4–10.5)
Creatinine, Ser: 2.89 mg/dL — ABNORMAL HIGH (ref 0.50–1.10)
Glucose, Bld: 92 mg/dL (ref 70–99)

## 2012-06-05 MED ORDER — FUROSEMIDE 40 MG PO TABS
40.0000 mg | ORAL_TABLET | Freq: Every day | ORAL | Status: DC
  Administered 2012-06-05 – 2012-06-06 (×2): 40 mg via ORAL
  Filled 2012-06-05 (×2): qty 1

## 2012-06-05 MED ORDER — WARFARIN SODIUM 2 MG PO TABS
2.0000 mg | ORAL_TABLET | Freq: Once | ORAL | Status: AC
  Administered 2012-06-05: 2 mg via ORAL
  Filled 2012-06-05: qty 1

## 2012-06-05 NOTE — Progress Notes (Signed)
Patient Name: [name] Date of Encounter: 06/05/2012     Active Problems:  * No active hospital problems. *     SUBJECTIVE  She feels much improved.    CURRENT MEDS    . ALPRAZolam  0.5 mg Oral BID  . amLODipine  5 mg Oral Daily  . aspirin EC  81 mg Oral Daily  . atorvastatin  10 mg Oral q1800  . Chlorhexidine Gluconate Cloth  6 each Topical Q0600  . ferrous sulfate  325 mg Oral BID PC  . isosorbide mononitrate  30 mg Oral Daily  . magnesium oxide  400 mg Oral QHS  . metoprolol  100 mg Oral BID  . mupirocin ointment  1 application Nasal BID  . pantoprazole  40 mg Oral BID  . paricalcitol  1 mcg Oral QHS  . sodium chloride  3 mL Intravenous Q12H  . spironolactone  12.5 mg Oral Daily  . warfarin  2 mg Oral ONCE-1800  . warfarin  2 mg Oral ONCE-1800  . Warfarin - Pharmacist Dosing Inpatient   Does not apply q1800    OBJECTIVE  Filed Vitals:   06/04/12 2138 06/05/12 0500 06/05/12 0517 06/05/12 1004  BP: 125/73  145/78 157/83  Pulse: 60  62 66  Temp: 98 F (36.7 C)  97.3 F (36.3 C)   TempSrc: Oral  Oral   Resp: 18  18   Height:      Weight:  199 lb 15.3 oz (90.7 kg)    SpO2: 95%  97%     Intake/Output Summary (Last 24 hours) at 06/05/12 1318 Last data filed at 06/05/12 1111  Gross per 24 hour  Intake   1260 ml  Output   3125 ml  Net  -1865 ml   Filed Weights   06/02/12 1209 06/04/12 0510 06/05/12 0500  Weight: 205 lb (92.987 kg) 202 lb 9.6 oz (91.899 kg) 199 lb 15.3 oz (90.7 kg)    PHYSICAL EXAM  General: Pleasant, NAD.  Elderly Neuro: Alert and oriented X 3. Moves all extremities spontaneously. Psych: Normal affect. HEENT:  Normal  Neck: Supple without bruits or JVD. Lungs:  Resp regular and unlabored, CTA. Heart: RRR no s3, s4.  Apical murmur.   Abdomen: Soft, non-tender, non-distended, BS + x 4.  Extremities: No clubbing, cyanosis or edema. DP/PT/Radials 2+ and equal bilaterally.  Accessory Clinical Findings  CBC  Basename  06/02/12 1335  WBC 6.3  NEUTROABS --  HGB 12.9  HCT 38.8  MCV 85.5  PLT 210   Basic Metabolic Panel  Basename 06/05/12 0626 06/04/12 0516 06/02/12 1848  NA 137 133* --  K 4.6 5.3* --  CL 99 99 --  CO2 25 23 --  GLUCOSE 92 100* --  BUN 43* 40* --  CREATININE 2.89* 2.69* --  CALCIUM 9.2 9.5 --  MG -- -- 2.2  PHOS -- -- --   Liver Function Tests  Basename 06/02/12 1335  AST 18  ALT 15  ALKPHOS 73  BILITOT 0.4  PROT 7.7  ALBUMIN 3.7   No results found for this basename: LIPASE:2,AMYLASE:2 in the last 72 hours Cardiac Enzymes  Basename 06/03/12 0500 06/02/12 2332 06/02/12 1848  CKTOTAL -- -- --  CKMB -- -- --  CKMBINDEX -- -- --  TROPONINI <0.30 <0.30 <0.30   BNP No components found with this basename: POCBNP:3 D-Dimer No results found for this basename: DDIMER:2 in the last 72 hours Hemoglobin A1C No results found for this basename:  HGBA1C in the last 72 hours Fasting Lipid Panel No results found for this basename: CHOL,HDL,LDLCALC,TRIG,CHOLHDL,LDLDIRECT in the last 72 hours Thyroid Function Tests  Basename 06/02/12 1848  TSH 7.484*  T4TOTAL --  T3FREE --  THYROIDAB --     Radiology/Studies  Dg Chest Portable 1 View  06/02/2012  *RADIOLOGY REPORT*  Clinical Data: Chest pain.  Shortness of breath.  PORTABLE CHEST - 1 VIEW  Comparison: 11/23/2011  Findings: Pacer unchanged. Low lung volumes are present, causing crowding of the pulmonary vasculature.  Given the low lung volumes and technique, heart size is considered in the upper normal range. Right hemidiaphragm mildly elevated.  IMPRESSION:  1.  Stable appearance of chest, with mild right hemidiaphragmatic elevation but no acute findings.   Original Report Authenticated By: Dellia Cloud, M.D.     ASSESSMENT AND PLAN  1.  Chest pain--resolved 2.  Diastolic CHF   ---  Improved, weight down at cost of rising serum Cr.   3.  CKD,    Plan  1.  Resume low dose oral furosemide. 2.  Plan dc  tomorrow. 3.  Recheck CXR today.  Signed, Shawnie Pons MD, [facility], FSCAI

## 2012-06-05 NOTE — Progress Notes (Signed)
ANTICOAGULATION CONSULT NOTE   Pharmacy Consult for Coumadin Indication: atrial fibrillation  Allergies  Allergen Reactions  . Amiodarone Hives  . Propylthiouracil Rash   Labs:  Basename 06/05/12 1610 06/05/12 0626 06/04/12 0516 06/03/12 1100 06/03/12 0500 06/02/12 2332 06/02/12 1848 06/02/12 1335  HGB -- -- -- -- -- -- -- 12.9  HCT -- -- -- -- -- -- -- 38.8  PLT -- -- -- -- -- -- -- 210  APTT -- -- -- -- -- -- -- --  LABPROT 22.3* -- 22.2* -- 21.3* -- -- --  INR 2.05* -- 2.04* -- 1.93* -- -- --  HEPARINUNFRC -- -- -- -- -- -- -- --  CREATININE -- 2.89* 2.69* 2.46* -- -- -- --  CKTOTAL -- -- -- -- -- -- -- --  CKMB -- -- -- -- -- -- -- --  TROPONINI -- -- -- -- <0.30 <0.30 <0.30 --    Estimated Creatinine Clearance: 19.4 ml/min (by C-G formula based on Cr of 2.89).  Assessment: 77 yo female who presents to the ED with chest pain. Patient takes chronic Coumadin at home for Afib.  INR today = 2.05  Home dose: 2 mg daily except 3 mg on Mon, Fri  Goal of Therapy:  INR 2-3   Plan:  -Coumadin 2 mg po tonight -INR daily  Okey Regal, PharmD [phone]  06/05/2012 9:31 AM

## 2012-06-06 ENCOUNTER — Telehealth: Payer: Self-pay | Admitting: Pharmacist

## 2012-06-06 DIAGNOSIS — I200 Unstable angina: Secondary | ICD-10-CM

## 2012-06-06 LAB — BASIC METABOLIC PANEL
BUN: 46 mg/dL — ABNORMAL HIGH (ref 6–23)
CO2: 25 mEq/L (ref 19–32)
Chloride: 102 mEq/L (ref 96–112)
Creatinine, Ser: 2.92 mg/dL — ABNORMAL HIGH (ref 0.50–1.10)
Glucose, Bld: 109 mg/dL — ABNORMAL HIGH (ref 70–99)

## 2012-06-06 MED ORDER — ISOSORBIDE MONONITRATE ER 30 MG PO TB24: 30.0000 mg | ORAL_TABLET | Freq: Every day | ORAL | Status: DC

## 2012-06-06 MED ORDER — WARFARIN SODIUM 3 MG PO TABS
3.0000 mg | ORAL_TABLET | Freq: Once | ORAL | Status: DC
  Filled 2012-06-06: qty 1

## 2012-06-06 NOTE — Progress Notes (Signed)
[facility]  Patient Status: New  [facility] is providing the following services: RN  If patient discharges after hours, please call [phone].   [name] 06/06/2012, 10:50 AM

## 2012-06-06 NOTE — Progress Notes (Signed)
Subjective:  Desperately wants to go home.   Objective:  Vital Signs in the last 24 hours: Temp:  [97.5 F (36.4 C)-97.9 F (36.6 C)] 97.5 F (36.4 C) (10/23 0534) Pulse Rate:  [60-73] 62  (10/23 0534) Resp:  [18] 18  (10/23 0534) BP: (124-157)/(74-83) 124/76 mmHg (10/23 0534) SpO2:  [94 %-95 %] 94 % (10/23 0534) Weight:  [200 lb 8 oz (90.946 kg)] 200 lb 8 oz (90.946 kg) (10/23 0534)  Intake/Output from previous day: 10/22 0701 - 10/23 0700 In: 1024 [P.O.:1024] Out: 1500 [Urine:1500]   Physical Exam: General: Well developed, well nourished, in no acute distress. Head:  Normocephalic and atraumatic. Lungs: Clear to auscultation and percussion. Heart: Pos S4 apical murmur Pulses: Pulses normal in all 4 extremities. Extremities: No clubbing or cyanosis. No edema. Neurologic: Alert and oriented x 3.    Lab Results: No results found for this basename: WBC:2,HGB:2,PLT:2 in the last 72 hours  Basename 06/06/12 0525 06/05/12 0626  NA 141 137  K 3.7 4.6  CL 102 99  CO2 25 25  GLUCOSE 109* 92  BUN 46* 43*  CREATININE 2.92* 2.89*   No results found for this basename: TROPONINI:2,CK,MB:2 in the last 72 hours Hepatic Function Panel No results found for this basename: PROT,ALBUMIN,AST,ALT,ALKPHOS,BILITOT,BILIDIR,IBILI in the last 72 hours No results found for this basename: CHOL in the last 72 hours No results found for this basename: PROTIME in the last 72 hours  Imaging: Dg Chest 2 View  06/05/2012  *RADIOLOGY REPORT*  Clinical Data: Congestive heart failure  CHEST - 2 VIEW  Comparison: 06/02/2012  Findings: Heart size and vascular pattern normal.  Cardiac pacer with generator over left thorax.  Lungs clear.  Mild elevation right diaphragm.  No evidence of pulmonary edema.  No change from prior study.  IMPRESSION: No acute abnormalities.   Original Report Authenticated By: Otilio Carpen, M.D.       Assessment/Plan:  1.  Chest pain  -- resolved.  Ambulates without  difficulty.  Continue to observe 2.  Diastolic CHF  -  Benign exam.  Lungs clear.  Weight up 1 lb but stable 3.  CKD, 3    -- stable.  Needs close monitoring  Can be dcd.  Needs follow up visit with Dr. [name] early next week or PA Also needs BMET on Monday as Cr is up from baseline.  Would use furosemide 40mg  PO daily at time of dc.  Issues discussed with patient  -- her understanding is only marginal   More than thirty minutes of dc time.      Shawnie Pons, MD, [facility], FSCAI 06/06/2012, 9:23 AM

## 2012-06-06 NOTE — Progress Notes (Signed)
ANTICOAGULATION CONSULT NOTE   Pharmacy Consult for Coumadin Indication: atrial fibrillation  Allergies  Allergen Reactions  . Amiodarone Hives  . Propylthiouracil Rash   Labs:  [name] 06/06/12 0525 06/05/12 0822 06/05/12 0626 06/04/12 0516  HGB -- -- -- --  HCT -- -- -- --  PLT -- -- -- --  APTT -- -- -- --  LABPROT 22.8* 22.3* -- 22.2*  INR 2.11* 2.05* -- 2.04*  HEPARINUNFRC -- -- -- --  CREATININE 2.92* -- 2.89* 2.69*  CKTOTAL -- -- -- --  CKMB -- -- -- --  TROPONINI -- -- -- --    Estimated Creatinine Clearance: 19.3 ml/min (by C-G formula based on Cr of 2.92).  Assessment: 77 yo female who presents to the ED with chest pain. Patient takes chronic Coumadin at home for Afib.  INR today = 2.11  Home dose: 2 mg daily except 3 mg on Mon, Fri  Goal of Therapy:  INR 2-3   Plan:  -Coumadin 3 mg po tonight -INR daily -If discharged today, recommend 3 mg MWF, 2 mg other days  Okey Regal, PharmD [phone]  06/06/2012 9:21 AM

## 2012-06-06 NOTE — Telephone Encounter (Signed)
Spoke with pt. In the hospital ,she is getting  ready to be d/C home today. Pt is aware of office appointment she has with Tereso Newcomer PA on 07/12/12 at 11:50 AM. Pt was made aware to bring her medication list and insurance card when she comes for F/U visit . Pt is aware that a nurse will call her to see how she is doing when she gets home. Pt verbalized understanding.

## 2012-06-06 NOTE — Discharge Summary (Signed)
Discharge Summary   Patient ID: [name] MRN: [id number], DOB/AGE: 75-Mar-1936 75 y.o.  Primary MD: [name], MD Primary Cardiologist: Dr. [name] Admit date: 06/02/2012 D/C date:     06/06/2012      Primary Discharge Diagnoses:  1. Unstable Angina  - No objective evidence of cardiac ischemia  - Improved with diuresis and addition of Imdur  2. Acute on Chronic Diastolic CHF  - Improved with diuresis. Dc'd on Lasix 40mg  daily  - Weight 205-->200lbs (dry weight ~197lbs)  3. Acute on Chronic Renal Insufficiency (Stage IV)  - Worsening Crt 2/2 diuresis  - Crt 2.46-->2.92 (baseline ~2.3), F/u BMET  4. Elevated TSH  - TSH 7.484  - F/u w/ PCP  Secondary Discharge Diagnoses:  Past Medical History  Diagnosis Date  . Cardiac pacemaker in situ   . Cellulitis and abscess of unspecified site     a. h/o MSRA cellulitis of abdominal wall.  Marland Kitchen CAD (coronary artery disease)     a. DES to OM 01/2005. b. Rotational atherotomy/DES to prox LAD 02/2008.  . Edema   . Chronic renal insufficiency     Cr ~2.3.  . Hypertension   . GERD (gastroesophageal reflux disease)   . Depression   . Chronic diastolic CHF (congestive heart failure)     a. EF 55-60% 11/2011, dry weight 200.  . Warfarin anticoagulation     a. Followed by PCP.  Marland Kitchen GI bleed     a. EGD 01/2007: antral ulcers related to NSAID/aspirin use.  . SSS (sick sinus syndrome)     a. H/o AV node ablation 2008 secondary to difficult to control afib/flutter, with subsequent pacer.  . Atrial fibrillation with rapid ventricular response     a. H/o amiodarone thyroiditis. b. s/p AV node ablation 2008 with implantation of PPM 01/2007, upgraded to CRT-P 06/2007.  Marland Kitchen Polycystic kidney disease     a. Per remote E-Chart records.  . Peripheral vascular disease   . Hyperthyroidism     a. Hx of intolerance to PTU.  Marland Kitchen Anemia, unspecified   . Blood transfusion   . H/O hiatal hernia   . Umbilical hernia     unrepaired (11/24/11)  .  Migraines 11/24/11    "I've always had them"  . Arthritis   . Gout     "get it in my feet"  . Valvular heart disease     Echo 11/2011: mild-mod MR.     Allergies Allergies  Allergen Reactions  . Amiodarone Hives  . Propylthiouracil Rash    Diagnostic Studies/Procedures:  None  History of Present Illness: 77 y.o. female w/ the above medical problems who presented to [facility] on 06/02/12 with complaints of chest pain, shortness of breath, and weight gain.  Hospital Course: In the ED her weight was up about 7lbs and she was volume overloaded on exam. EKG revealed V-paced rhythm with underlying atrial fibrillation 60bpm. CXR was without acute cardiopulmonary abnormalities. Labs were significant for normal troponin, BUN/Cr 38/2.46. pBNP 5271. Her chest pain was concerning for unstable angina and she was volume overloaded for which she was admitted for further evaluation and treatment. Imdur was added to her medical regimen. Cardiac enzymes were cycled and remained negative. She was gently diuresed with improvement in symptoms but worsening of renal function. Lasix was switched to oral dosing with plans for follow up BMET early next week. She was able to ambulate without chest pain. She diuresed a total of 4.4L and weight 205-->200lbs. TSH  was noted to be elevated at 7.484 and will need to be followed up by her primary care provider. She was seen and evaluated by Dr. Riley Kill who felt she was stable for discharge home with plans for follow up as scheduled below.  Discharge Vitals: Blood pressure 157/81, pulse 66, temperature 97.5 F (36.4 C), temperature source Oral, resp. rate 18, height 5\' 7"  (1.702 m), weight 200 lb 8 oz (90.946 kg), SpO2 98.00%.  Labs: Component Value Date   WBC 6.3 06/02/2012   HGB 12.9 06/02/2012   HCT 38.8 06/02/2012   MCV 85.5 06/02/2012   PLT 210 06/02/2012     06/06/2012 05:25  Prothrombin Time 22.8 (H)  INR 2.11 (H)   Lab 06/06/12 0525 06/02/12  1335  NA 141 --  K 3.7 --  CL 102 --  CO2 25 --  BUN 46* --  CREATININE 2.92* --  CALCIUM 9.3 --  PROT -- 7.7  BILITOT -- 0.4  ALKPHOS -- 73  ALT -- 15  AST -- 18  GLUCOSE 109* --    06/02/2012 13:01 06/04/2012 05:16  Pro B Natriuretic peptide (BNP) 5271.0 (H) 6449.0 (H)     06/02/2012 18:48 06/02/2012 23:32 06/03/2012 05:00  Troponin I <0.30 <0.30 <0.30     06/02/2012 18:48  TSH 7.484 (H)    Discharge Medications     Medication List     As of 06/06/2012 12:15 PM    TAKE these medications         ALPRAZolam 0.5 MG tablet   Commonly known as: XANAX   Take 0.5 mg by mouth 2 (two) times daily.      amLODipine 5 MG tablet   Commonly known as: NORVASC   Take 5 mg by mouth daily.      aspirin EC 81 MG tablet   Take 81 mg by mouth daily.      ferrous sulfate 325 (65 FE) MG tablet   Take 325 mg by mouth 2 (two) times daily after a meal.      furosemide 40 MG tablet   Commonly known as: LASIX   Take 40 mg by mouth daily.      isosorbide mononitrate 30 MG 24 hr tablet   Commonly known as: IMDUR   Take 1 tablet (30 mg total) by mouth daily.      lovastatin 10 MG tablet   Commonly known as: MEVACOR   Take 10 mg by mouth at bedtime.      magnesium oxide 400 MG tablet   Commonly known as: MAG-OX   Take 400 mg by mouth at bedtime.      metoprolol 100 MG tablet   Commonly known as: LOPRESSOR   Take 100 mg by mouth 2 (two) times daily.      oxyCODONE-acetaminophen 5-325 MG per tablet   Commonly known as: PERCOCET/ROXICET   Take 0.5-1 tablets by mouth 3 (three) times daily as needed. For pain      paricalcitol 1 MCG capsule   Commonly known as: ZEMPLAR   Take 1 mcg by mouth at bedtime.      PROTONIX 40 MG tablet   Generic drug: pantoprazole   Take 40 mg by mouth 2 (two) times daily.      spironolactone 25 MG tablet   Commonly known as: ALDACTONE   Take 12.5 mg by mouth daily.      warfarin 2 MG tablet   Commonly known as: COUMADIN   Take 2-3 mg by  mouth at  bedtime. Pt takes 1 & 1/2 tabs on monday, Friday, and 1 tab all other days          Disposition   Discharge Orders    Future Appointments: Provider: Department: Dept Phone: Center:   06/11/2012 11:50 AM Beatrice Lecher, PA [facility] [phone] [facility]   06/11/2012 12:15 PM [facility] [phone] [facility]     Future Orders Please Complete By Expires   Diet - low sodium heart healthy      Increase activity slowly      Discharge instructions      Comments:   * Please follow up with your primary care provider regarding your abnormal thyroid blood work     Follow-up Information    Follow up with Tereso Newcomer, PA. On 06/11/2012. (Your appointment is at 11:50. Please arrive early or stay late for your blood work (BMET))    Contact information:   [facility] [address] [place] [address] [phone]       Follow up with [name], MD.   Contact information:   [address] [place] [address] [phone]           Outstanding Labs/Studies:  1. BMET 2. F/u TSH, Free T3/T4  Duration of Discharge Encounter: Greater than 30 minutes including physician and PA time.  Signed, Lecia Esperanza PA-C 06/06/2012, 12:15 PM

## 2012-06-06 NOTE — Telephone Encounter (Signed)
Message copied by Velda Shell on Wed Jun 06, 2012 12:07 PM ------      Message from: [place] B      Created: Wed Jun 06, 2012 11:34 AM      Regarding: 7-10 day Transition of Care Patient        I have scheduled the patient to come in and have an appointment on 06/11/12 @ 11:50am with Tereso Newcomer.

## 2012-06-06 NOTE — Progress Notes (Signed)
Pt d/c to home with son. D/c instructions and medication reviewed with PT. Pt states understanding. All Pt questions answered/

## 2012-06-06 NOTE — Care Management Note (Signed)
    Page 1 of 1   06/06/2012     10:01:56 AM   CARE MANAGEMENT NOTE 06/06/2012  Patient:  [name], [name]   Account Number:  [id number]  Date Initiated:  06/06/2012  Documentation initiated by:  Tera Mater  Subjective/Objective Assessment:   77yo female admitted with CHF. Pt. lives at home with spouse.     Action/Plan:   In to complete HF Screen. Pt. states she has had [facility] in past and was satisfied with their services   Anticipated DC Date:  06/06/2012   Anticipated DC Plan:  HOME W HOME HEALTH SERVICES      DC Planning Services  CM consult      [facility] Choice  HOME HEALTH   Choice offered to / List presented to:          [facility] arranged  HH-1 RN  HH-10 DISEASE MANAGEMENT      HH agency  [facility]   Status of service:  Completed, signed off Medicare Important Message given?   (If response is "NO", the following Medicare IM given date fields will be blank) Date Medicare IM given:   Date Additional Medicare IM given:    Discharge Disposition:  HOME W HOME HEALTH SERVICES  Per UR Regulation:  Reviewed for med. necessity/level of care/duration of stay  If discussed at Long Length of Stay Meetings, dates discussed:    Comments:  06/06/12 0930 In to speak with pt. about [facility] services and pt. agreed to [facility] RN for HF management. TC to Lupita Leash, with [facility] to give referral.  Pt. to dc home today.  Tera Mater, RN, BSN NCM [phone]

## 2012-06-07 ENCOUNTER — Telehealth: Payer: Self-pay | Admitting: Cardiovascular Disease

## 2012-06-07 NOTE — Telephone Encounter (Signed)
**Note De-Identified [name] Obfuscation** Pt. has no complaints at this time. She states that her medications are at her pharmacy ([facility] [address]) and that her son is going to pick them up shortly, son verified. Pt. is aware of app. with Tereso Newcomer, PA-C on 06/11/12 at 11:50 and was given this office phone # to call if she needs assistance.

## 2012-06-07 NOTE — Telephone Encounter (Signed)
SPOKE WITH DEBRA FROM HOME HEALTH DID SOME PT TEACHING. PT REFUSING HOME HEALTH  AT THIS TIME .Zack Seal

## 2012-06-07 NOTE — Telephone Encounter (Signed)
Pt told Stanton Kidney she did not need home health and she was out and about today and Stanton Kidney wants to talk about this

## 2012-06-11 ENCOUNTER — Telehealth: Payer: Self-pay | Admitting: *Deleted

## 2012-06-11 ENCOUNTER — Ambulatory Visit (INDEPENDENT_AMBULATORY_CARE_PROVIDER_SITE_OTHER): Payer: Medicare Other | Admitting: *Deleted

## 2012-06-11 ENCOUNTER — Ambulatory Visit (INDEPENDENT_AMBULATORY_CARE_PROVIDER_SITE_OTHER): Payer: Medicare Other | Admitting: Physician Assistant

## 2012-06-11 ENCOUNTER — Encounter: Payer: Self-pay | Admitting: Physician Assistant

## 2012-06-11 ENCOUNTER — Other Ambulatory Visit (INDEPENDENT_AMBULATORY_CARE_PROVIDER_SITE_OTHER): Payer: Medicare Other

## 2012-06-11 VITALS — BP 124/77 | HR 66 | Ht 67.0 in | Wt 203.8 lb

## 2012-06-11 DIAGNOSIS — I509 Heart failure, unspecified: Secondary | ICD-10-CM

## 2012-06-11 DIAGNOSIS — R0989 Other specified symptoms and signs involving the circulatory and respiratory systems: Secondary | ICD-10-CM

## 2012-06-11 DIAGNOSIS — N189 Chronic kidney disease, unspecified: Secondary | ICD-10-CM

## 2012-06-11 DIAGNOSIS — I5043 Acute on chronic combined systolic (congestive) and diastolic (congestive) heart failure: Secondary | ICD-10-CM

## 2012-06-11 DIAGNOSIS — I498 Other specified cardiac arrhythmias: Secondary | ICD-10-CM

## 2012-06-11 DIAGNOSIS — I4891 Unspecified atrial fibrillation: Secondary | ICD-10-CM

## 2012-06-11 DIAGNOSIS — I10 Essential (primary) hypertension: Secondary | ICD-10-CM

## 2012-06-11 DIAGNOSIS — I5032 Chronic diastolic (congestive) heart failure: Secondary | ICD-10-CM

## 2012-06-11 DIAGNOSIS — I25810 Atherosclerosis of coronary artery bypass graft(s) without angina pectoris: Secondary | ICD-10-CM

## 2012-06-11 DIAGNOSIS — R7989 Other specified abnormal findings of blood chemistry: Secondary | ICD-10-CM

## 2012-06-11 LAB — BASIC METABOLIC PANEL
BUN: 41 mg/dL — ABNORMAL HIGH (ref 6–23)
CO2: 21 mEq/L (ref 19–32)
Chloride: 99 mEq/L (ref 96–112)
Glucose, Bld: 98 mg/dL (ref 70–99)
Potassium: 4.5 mEq/L (ref 3.5–5.1)

## 2012-06-11 NOTE — Progress Notes (Signed)
[address], [address] [place], [place]  [address] Phone: [phone], Fax:  [phone]  Date:  06/11/2012   Name:  [name]   DOB:  [birth year]   MRN:  [id number]  PCP:  [name], MD  Primary Cardiologist:  Dr. Charlton Haws  Primary Electrophysiologist:  Dr. Lewayne Bunting    History of Present Illness: [name] is a 77 y.o. female who returns for follow up after recent hospitalization.    She has a hx of CAD, s/p stenting to the CFX as well as the LAD in the past. Myoview 6/10: No scar or ischemia, EF 79%. Last LHC 5/12: pLAD stent patent, 30% distal to the stent, mLAD 30%, ostial first septal branch 80%, OM1 stent patent with 10-20% ISR, mCFX 20%, pRCA 30-40%, mRCA 50-60%. Medical therapy continued. Last echo 4/13: EF 55-60%, mild to moderate MR, moderate LAE, mild RVE, mild RAE, PASP 39. Other history includes chronic atrial fibrillation, status post AV nodal ablation with implantation of a biventricular pacer, diastolic CHF, chronic kidney disease.   She was admitted 10/19-10/23 with chest discomfort in the setting of acute on chronic diastolic CHF and acute on chronic renal failure. Cardiac enzymes were normal. Long-acting nitrates were added for chest discomfort. She was gently diuresed with a 5 pound weight loss. TSH was somewhat elevated and she was asked to followup with her PCP for this. Since discharge, she has done well. She denies chest pain, syncope, orthopnea, PND. She has some mild pedal edema without significant change. Her weights have been stable. She has a nonproductive cough  Labs (10/13):   K 3.7, creatinine 2.92, Hgb 12.9, TSH 7.44    Wt Readings from Last 3 Encounters:  06/11/12 203 lb 12.8 oz (92.443 kg)  06/06/12 200 lb 8 oz (90.946 kg)  03/19/12 202 lb (91.627 kg)     Past Medical History  Diagnosis Date  . Cardiac pacemaker in situ   . Cellulitis and abscess of unspecified site     a. h/o MSRA cellulitis of abdominal  wall.  Marland Kitchen CAD (coronary artery disease)     a. DES to OM 01/2005. b. Rotational atherotomy/DES to prox LAD 02/2008.  . Edema   . Chronic renal insufficiency     Cr ~2.3.  . Hypertension   . GERD (gastroesophageal reflux disease)   . Depression   . Chronic diastolic CHF (congestive heart failure)     a. EF 55-60% 11/2011, dry weight 200.  . Warfarin anticoagulation     a. Followed by PCP.  Marland Kitchen GI bleed     a. EGD 01/2007: antral ulcers related to NSAID/aspirin use.  . SSS (sick sinus syndrome)     a. H/o AV node ablation 2008 secondary to difficult to control afib/flutter, with subsequent pacer.  . Atrial fibrillation with rapid ventricular response     a. H/o amiodarone thyroiditis. b. s/p AV node ablation 2008 with implantation of PPM 01/2007, upgraded to CRT-P 06/2007.  Marland Kitchen Polycystic kidney disease     a. Per remote E-Chart records.  . Peripheral vascular disease   . Hyperthyroidism     a. Hx of intolerance to PTU.  Marland Kitchen Anemia, unspecified   . Blood transfusion   . H/O hiatal hernia   . Umbilical hernia     unrepaired (11/24/11)  . Migraines 11/24/11    "I've always had them"  . Arthritis   . Gout     "get it in my feet"  . Valvular  heart disease     Echo 11/2011: mild-mod MR.    Current Outpatient Prescriptions  Medication Sig Dispense Refill  . ALPRAZolam (XANAX) 0.5 MG tablet Take 0.5 mg by mouth 2 (two) times daily.      Marland Kitchen amLODipine (NORVASC) 5 MG tablet Take 5 mg by mouth daily.        Marland Kitchen aspirin EC 81 MG tablet Take 81 mg by mouth daily.      . ferrous sulfate 325 (65 FE) MG tablet Take 325 mg by mouth 2 (two) times daily after a meal.      . furosemide (LASIX) 40 MG tablet Take 40 mg by mouth daily.      . isosorbide mononitrate (IMDUR) 30 MG 24 hr tablet Take 1 tablet (30 mg total) by mouth daily.  30 tablet  6  . lovastatin (MEVACOR) 10 MG tablet Take 10 mg by mouth at bedtime.        . magnesium oxide (MAG-OX) 400 MG tablet Take 400 mg by mouth at bedtime.       .  metoprolol (LOPRESSOR) 100 MG tablet Take 100 mg by mouth 2 (two) times daily.      Marland Kitchen oxyCODONE-acetaminophen (PERCOCET) 5-325 MG per tablet Take 0.5-1 tablets by mouth 3 (three) times daily as needed. For pain      . pantoprazole (PROTONIX) 40 MG tablet Take 40 mg by mouth 2 (two) times daily.       . paricalcitol (ZEMPLAR) 1 MCG capsule Take 1 mcg by mouth at bedtime.      Marland Kitchen spironolactone (ALDACTONE) 25 MG tablet Take 12.5 mg by mouth daily.      Marland Kitchen warfarin (COUMADIN) 2 MG tablet Take 2-3 mg by mouth at bedtime. Pt takes 1 & 1/2 tabs on monday, Friday, and 1 tab all other days      . DISCONTD: FLUoxetine (PROZAC) 20 MG capsule Take 20 mg by mouth daily.          Allergies: Allergies  Allergen Reactions  . Amiodarone Hives  . Propylthiouracil Rash    Social History:   reports that she quit smoking about 7 years ago. Her smoking use included Cigarettes. She has a 50 pack-year smoking history. She has never used smokeless tobacco. She reports that she does not drink alcohol or use illicit drugs.   ROS:  Please see the history of present illness.    All other systems reviewed and negative.   PHYSICAL EXAM: VS:  BP 124/77  Pulse 66  Ht 5\' 7"  (1.702 m)  Wt 203 lb 12.8 oz (92.443 kg)  BMI 31.92 kg/m2 Well nourished, well developed, in no acute distress HEENT: normal Neck: no JVD Cardiac:  normal S1, S2; RRR; no murmur Lungs:  clear to auscultation bilaterally, no wheezing, rhonchi or rales Abd: soft, nontender, no hepatomegaly Ext: Tight 1+ bilateral LE edema Skin: warm and dry Neuro:  CNs 2-12 intact, no focal abnormalities noted  EKG:  V. paced, HR 66      ASSESSMENT AND PLAN:  1. Chronic Diastolic CHF:   Volume appears stable. Check a followup basic metabolic panel today to followup on renal function and potassium. Continue current therapy. Followup with Dr. [name] in 4-6 weeks.  2. Chronic Kidney Disease:   As Noted, Check a Followup Basic Metabolic Panel Today.  3.  Coronary Artery Disease:   Stable. No angina. Continue aspirin and statin. Followup as noted.  4. Abnormal TSH:   She has been advised to followup  with her PCP.  5. Hypertension:   Controlled.  6. Atrial Fibrillation:   Coumadin is managed by her primary care physician.  Luna Glasgow, PA-C  12:25 PM 06/11/2012

## 2012-06-11 NOTE — Telephone Encounter (Signed)
pt notified to hold lasix and spironolactone tomorrow 06/12/12 and resume on 06/13/12, bmet 11/7, pt verbalized understanding

## 2012-06-11 NOTE — Progress Notes (Signed)
PPM check-[employer]

## 2012-06-11 NOTE — Patient Instructions (Addendum)
Weigh yourself daily.  If your weight goes up 3 lbs in one day or 5 lbs in one week, call us that day.    Lab work today ( bmet )   Reschedule appointment Dr.Taylor    Your physician recommends that you schedule a follow-up appointment in: with Dr.Nishan in 4 to 6 weeks.

## 2012-06-11 NOTE — Telephone Encounter (Signed)
Message copied by Tarri Fuller on Mon Jun 11, 2012  5:45 PM ------      Message from: [place], [place] T      Created: Mon Jun 11, 2012  5:33 PM       Creatinine worse.      Hold Lasix and Spironolactone x 1 day only, then resume.      Repeat BMET in one week.      Tereso Newcomer, PA-C  5:33 PM 06/11/2012

## 2012-06-13 NOTE — Discharge Summary (Signed)
See my progress note from the same day.  She will need early follow up.  Level of understanding is of concern.  More than thirty minutes of dc time.

## 2012-06-18 ENCOUNTER — Encounter: Payer: Self-pay | Admitting: Internal Medicine

## 2012-06-18 LAB — PACEMAKER DEVICE OBSERVATION
BATTERY VOLTAGE: 2.98 V
RV LEAD IMPEDENCE PM: 526 Ohm

## 2012-06-21 ENCOUNTER — Other Ambulatory Visit (INDEPENDENT_AMBULATORY_CARE_PROVIDER_SITE_OTHER): Payer: Medicare Other

## 2012-06-21 DIAGNOSIS — I5043 Acute on chronic combined systolic (congestive) and diastolic (congestive) heart failure: Secondary | ICD-10-CM

## 2012-06-21 LAB — BASIC METABOLIC PANEL
BUN: 43 mg/dL — ABNORMAL HIGH (ref 6–23)
GFR: 17.2 mL/min — ABNORMAL LOW (ref >60.00)
Glucose, Bld: 98 mg/dL (ref 70–99)
Potassium: 3.8 mEq/L (ref 3.5–5.1)

## 2012-06-22 ENCOUNTER — Telehealth: Payer: Self-pay | Admitting: *Deleted

## 2012-06-22 NOTE — Telephone Encounter (Signed)
Message copied by Tarri Fuller on Fri Jun 22, 2012 12:29 PM ------      Message from: [place], [place] T      Created: Thu Jun 21, 2012  5:35 PM       Renal fxn stable      Repeat BMET at follow up with Dr. Charlton Haws      Tereso Newcomer, PA-C  5:35 PM 06/21/2012

## 2012-06-22 NOTE — Telephone Encounter (Signed)
pt notified about lab results, repeat  bmet 12/12 w/Nishan appt, pt verbalized understanding

## 2012-07-03 ENCOUNTER — Telehealth: Payer: Self-pay | Admitting: Cardiovascular Disease

## 2012-07-03 NOTE — Telephone Encounter (Signed)
Pt [phone] has been having chest pain every more since she left the hospital about 3 wks ago. SOB at times, pt also took nitro this morning.

## 2012-07-03 NOTE — Telephone Encounter (Signed)
SPOKE WITH PT HAS COMPLAINED OF CHEST PAIN   FOR 3 WEEKS   ALMOST EVERY AM  HAS TAKEN NTG WITH  RELIEF DOES NOT KNOW WHAT B/P IS RUNNING INSTRUCTED FOR PT TO  CHECK B/P AND BRING READINGS  WITH HER MOVED APPT UP  WILL SEE LORI GERHARDT NP  ON MON 07-09-12 AT  11:30 AM  INFORMED IF S/S INCREASE OR WORSEN  THEN NEEDS TO GO TO ER FOR EVAL AND TREATMENT PT AGREES./CY

## 2012-07-03 NOTE — Telephone Encounter (Signed)
Pt complaining of chest pain since leaving the hospital.  She states she gets the pain at least once daily in the am while sitting drinking coffee.  Nitro relieves the pain (with one or two).  Pain always starts at rest.  Sometimes sob but no nausea.  She is requesting an appt with dr [name] this week.  No chest pain currently.

## 2012-07-06 DIAGNOSIS — I10 Essential (primary) hypertension: Secondary | ICD-10-CM

## 2012-07-06 DIAGNOSIS — I5032 Chronic diastolic (congestive) heart failure: Secondary | ICD-10-CM

## 2012-07-06 DIAGNOSIS — N189 Chronic kidney disease, unspecified: Secondary | ICD-10-CM

## 2012-07-06 DIAGNOSIS — R6889 Other general symptoms and signs: Secondary | ICD-10-CM

## 2012-07-06 DIAGNOSIS — I25810 Atherosclerosis of coronary artery bypass graft(s) without angina pectoris: Secondary | ICD-10-CM

## 2012-07-09 ENCOUNTER — Encounter: Payer: Self-pay | Admitting: Nurse Practitioner

## 2012-07-09 ENCOUNTER — Ambulatory Visit (INDEPENDENT_AMBULATORY_CARE_PROVIDER_SITE_OTHER): Payer: Medicare Other | Admitting: Nurse Practitioner

## 2012-07-09 VITALS — BP 138/80 | HR 64 | Ht 67.0 in | Wt 207.1 lb

## 2012-07-09 DIAGNOSIS — L299 Pruritus, unspecified: Secondary | ICD-10-CM

## 2012-07-09 DIAGNOSIS — E039 Hypothyroidism, unspecified: Secondary | ICD-10-CM

## 2012-07-09 LAB — SEDIMENTATION RATE: Sed Rate: 38 mm/hr — ABNORMAL HIGH (ref 0–22)

## 2012-07-09 LAB — BASIC METABOLIC PANEL
BUN: 42 mg/dL — ABNORMAL HIGH (ref 6–23)
CO2: 25 mEq/L (ref 19–32)
Calcium: 9.1 mg/dL (ref 8.4–10.5)
Chloride: 102 mEq/L (ref 96–112)
Creatinine, Ser: 2.4 mg/dL — ABNORMAL HIGH (ref 0.4–1.2)
GFR: 21.39 mL/min — ABNORMAL LOW (ref >60.00)
Glucose, Bld: 101 mg/dL — ABNORMAL HIGH (ref 70–99)
Potassium: 4.3 mEq/L (ref 3.5–5.1)
Sodium: 136 mEq/L (ref 135–145)

## 2012-07-09 LAB — CBC WITH DIFFERENTIAL/PLATELET
Basophils Absolute: 0 10*3/uL (ref 0.0–0.1)
Basophils Relative: 0.6 % (ref 0.0–3.0)
Eosinophils Absolute: 0.3 10*3/uL (ref 0.0–0.7)
Eosinophils Relative: 4.1 % (ref 0.0–5.0)
HCT: 39.7 % (ref 36.0–46.0)
Hemoglobin: 12.9 g/dL (ref 12.0–15.0)
Lymphocytes Relative: 11.3 % — ABNORMAL LOW (ref 12.0–46.0)
Lymphs Abs: 0.8 10*3/uL (ref 0.7–4.0)
MCHC: 32.5 g/dL (ref 30.0–36.0)
MCV: 89.1 fl (ref 78.0–100.0)
Monocytes Absolute: 0.7 10*3/uL (ref 0.1–1.0)
Monocytes Relative: 9 % (ref 3.0–12.0)
Neutro Abs: 5.5 10*3/uL (ref 1.4–7.7)
Neutrophils Relative %: 75 % (ref 43.0–77.0)
Platelets: 201 10*3/uL (ref 150.0–400.0)
RBC: 4.46 Mil/uL (ref 3.87–5.11)
RDW: 16 % — ABNORMAL HIGH (ref 11.5–14.6)
WBC: 7.4 10*3/uL (ref 4.5–10.5)

## 2012-07-09 LAB — TSH: TSH: 4.93 u[IU]/mL (ref 0.35–5.50)

## 2012-07-09 NOTE — Progress Notes (Signed)
[name] Date of Birth: 1935-05-01 Medical Record #[id number]  History of Present Illness: [name] is seen today for a work in visit. She is seen for Dr. [name]. She has multiple issues which include CAD, s/p stenting of the LCX as well as the LAD in the past. Last Myoview was in 2010 and showed no scar or ischemia. EF 79%. Last cath in May of 2012 with patent stent in the LAD, 30% distal to the stent, mLAD 30%, ostial first septal branch 80%, OM1 stent patent with 10 to 20% ISR, mLCX 20%, pRCA 30 to 40%, mRCA of 50 to 60%. She has been managed medically. Last echo in April of 2013 and showed EF of 55 to 60%, mild to moderate MR, moderate LAE, mild RVE, mild RAE, PASP 39. Other history includes chronic atrial fib, prior AV nodal ablation with implantation of a BIv pacer, diastolic CHF, and CKD.   She was admitted back in October with chest pain and acute on chronic heart failure. Gently diuresed. Imdur was added. Seen by Tereso Newcomer, PA at the end of October and felt to be doing ok.   She comes in today. She is here with her son. She called last week to report daily episodes of chest pain for the prior 3 weeks. Basically started since her last visit here. Was happening every morning after she got up. Using NTG 1 to 2 tabs with relief. BP has been ok. Not exertional in nature but she is not very active as a general rule. She now says that this has all stopped. Not sure why. She is here now because of terrible itching all over. Has been present since her admission last month. She scratches constantly. Took a short course of prednisone with no relief. Only new medicine is her Imdur. Weight fluctuates. Tries to not use salt. Does weigh daily but does not take extra lasix due to chronic urinary frequency anyways.   Current Outpatient Prescriptions on File Prior to Visit  Medication Sig Dispense Refill  . ALPRAZolam (XANAX) 0.5 MG tablet Take 0.5 mg by mouth 2 (two) times daily.      Marland Kitchen amLODipine  (NORVASC) 5 MG tablet Take 5 mg by mouth daily.        Marland Kitchen aspirin EC 81 MG tablet Take 81 mg by mouth daily.      . ferrous sulfate 325 (65 FE) MG tablet Take 325 mg by mouth 2 (two) times daily after a meal.      . furosemide (LASIX) 40 MG tablet Take 40 mg by mouth daily.      . isosorbide mononitrate (IMDUR) 30 MG 24 hr tablet Take 1 tablet (30 mg total) by mouth daily.  30 tablet  6  . lovastatin (MEVACOR) 10 MG tablet Take 10 mg by mouth at bedtime.        . magnesium oxide (MAG-OX) 400 MG tablet Take 400 mg by mouth at bedtime.       . metoprolol (LOPRESSOR) 100 MG tablet Take 100 mg by mouth 2 (two) times daily.      Marland Kitchen oxyCODONE-acetaminophen (PERCOCET) 5-325 MG per tablet Take 0.5-1 tablets by mouth 3 (three) times daily as needed. For pain      . pantoprazole (PROTONIX) 40 MG tablet Take 40 mg by mouth 2 (two) times daily.       . paricalcitol (ZEMPLAR) 1 MCG capsule Take 1 mcg by mouth at bedtime.      Marland Kitchen spironolactone (ALDACTONE) 25  MG tablet Take 12.5 mg by mouth daily.      Marland Kitchen warfarin (COUMADIN) 2 MG tablet Take 2-3 mg by mouth at bedtime. Pt takes 1 & 1/2 tabs on monday, Friday, and 1 tab all other days      . [DISCONTINUED] FLUoxetine (PROZAC) 20 MG capsule Take 20 mg by mouth daily.          Allergies  Allergen Reactions  . Amiodarone Hives  . Propylthiouracil Rash    Past Medical History  Diagnosis Date  . Cardiac pacemaker in situ   . Cellulitis and abscess of unspecified site     a. h/o MSRA cellulitis of abdominal wall.  Marland Kitchen CAD (coronary artery disease)     a. DES to OM 01/2005. b. Rotational atherotomy/DES to prox LAD 02/2008.  . Edema   . Chronic renal insufficiency     Cr ~2.3.  . Hypertension   . GERD (gastroesophageal reflux disease)   . Depression   . Chronic diastolic CHF (congestive heart failure)     a. EF 55-60% 11/2011, dry weight 200.  . Warfarin anticoagulation     a. Followed by PCP.  Marland Kitchen GI bleed     a. EGD 01/2007: antral ulcers related to  NSAID/aspirin use.  . SSS (sick sinus syndrome)     a. H/o AV node ablation 2008 secondary to difficult to control afib/flutter, with subsequent pacer.  . Atrial fibrillation with rapid ventricular response     a. H/o amiodarone thyroiditis. b. s/p AV node ablation 2008 with implantation of PPM 01/2007, upgraded to CRT-P 06/2007.  Marland Kitchen Polycystic kidney disease     a. Per remote E-Chart records.  . Peripheral vascular disease   . Hyperthyroidism     a. Hx of intolerance to PTU.  Marland Kitchen Anemia, unspecified   . Blood transfusion   . H/O hiatal hernia   . Umbilical hernia     unrepaired (11/24/11)  . Migraines 11/24/11    "I've always had them"  . Arthritis   . Gout     "get it in my feet"  . Valvular heart disease     Echo 11/2011: mild-mod MR.    Past Surgical History  Procedure Date  . Esophagogastroduodenoscopy   . Tonsillectomy ~ 1960  . Coronary angioplasty     percutanous transluminal   . Cardiac catheterization 2006; 2008; 2009; 01/13/2011  . Back surgery   . Laminectomy and microdiscectomy lumbar spine 08/2003  . Cardioversion 10/2004; 02/2007    /E-chart  . Cholecystectomy   . Appendectomy 1954  . Vaginal hysterectomy ?1981  . Av node ablation 02/2007    /E-chart  . Insert / replace / remove pacemaker 01/2007    initial placement  . Insert / replace / remove pacemaker 06/2007    upgraded/E-chart  . Av fistula placement, brachiocephalic 09/2007    right; "didn't work; my veins were too small"  . Joint replacement   . Total knee arthroplasty 01/2009    left  . Bladder and bowel 1970's    "tacked; damaged my intestines & had to remove some; done @ [facility]"    History  Smoking status  . Former Smoker -- 1.0 packs/day for 50 years  . Types: Cigarettes  . Quit date: 09/15/2004  Smokeless tobacco  . Never Used    History  Alcohol Use No    Family History  Problem Relation Age of Onset  . Colon cancer Neg Hx   . Thyroid disease Neg Hx   .  Stroke Brother   .  Kidney disease Brother   . Other Mother     Sepsis and perforated colon  . Other Father     Motor vehicle accident    Review of Systems: The review of systems is per the HPI.  All other systems were reviewed and are negative.  Physical Exam: BP 138/80  Pulse 64  Ht 5\' 7"  (1.702 m)  Wt 207 lb 1.9 oz (93.949 kg)  BMI 32.44 kg/m2 Patient is very pleasant and in no acute distress. She is obese. Weight is up a few pounds today. Skin is warm but pretty dry. Color is normal.  HEENT is unremarkable. Normocephalic/atraumatic. PERRL. Sclera are nonicteric. Neck is supple. No masses. No JVD. Lungs are clear. Cardiac exam shows an irregular rhythm. Rate is controlled. Abdomen is soft. Extremities are with trace edema. She does have a very fine generalized red rash on her trunk, legs and arms. She is scratching pretty intensely here today. Gait and ROM are intact. She is using a cane.  No gross neurologic deficits noted.  LABORATORY DATA: Pending  EKG shows V pacing with underlying atrial fib.   Lab Results  Component Value Date   WBC 6.3 06/02/2012   HGB 12.9 06/02/2012   HCT 38.8 06/02/2012   PLT 210 06/02/2012   GLUCOSE 98 06/21/2012   ALT 15 06/02/2012   AST 18 06/02/2012   NA 135 06/21/2012   K 3.8 06/21/2012   CL 104 06/21/2012   CREATININE 2.8* 06/21/2012   BUN 43* 06/21/2012   CO2 21 06/21/2012   TSH 7.484* 06/02/2012   INR 2.11* 06/06/2012    Assessment / Plan:  1. Itching - questionable etiology - only new medicine is the Imdur. We will check labs today. May have to consider a trial off the Imdur. May have worsening renal disease. Has not tried antihistamines.   2. CAD - chest pain - that has now resolved over the past one week. Will continue with sl NTG. May need further evaluation upon return.   3. CKD - rechecking labs today  4. Elevated TSH - not on replacement - will recheck today as well.   5. Diastolic heart failure - we've discussed how to use extra lasix prn weight  gains.  She is due back in here on the 12th with Dr. [name]. Will see what her labs look like today. Further disposition to follow.   Patient is agreeable to this plan and will call if any problems develop in the interim.

## 2012-07-09 NOTE — Patient Instructions (Addendum)
We need to check some labs today.   If your labs are ok, we will give you a trial off of Imdur to see if this helps with the rash since this is your newest medicine - we should be calling you later today or in the morning.   Use your NTG under your tongue for recurrent chest pain. May take one tablet every 5 minutes. If you are still having discomfort after 3 tablets in 15 minutes, call 911.  Weigh daily. Take an extra dose of Lasix if your weight goes up 2 to 3 pounds overnight.  You can try some plain Claritin or Zyrtec to take in the daytime and try Benadryl at night.   Dr. [name] is seeing you on the 12th of December.   Call the [facility] office at [phone] if you have any questions, problems or concerns.

## 2012-07-26 ENCOUNTER — Telehealth: Payer: Self-pay | Admitting: *Deleted

## 2012-07-26 ENCOUNTER — Ambulatory Visit (INDEPENDENT_AMBULATORY_CARE_PROVIDER_SITE_OTHER): Payer: Medicare Other | Admitting: Cardiovascular Disease

## 2012-07-26 ENCOUNTER — Ambulatory Visit: Payer: Medicare Other | Admitting: Cardiovascular Disease

## 2012-07-26 ENCOUNTER — Other Ambulatory Visit (INDEPENDENT_AMBULATORY_CARE_PROVIDER_SITE_OTHER): Payer: Medicare Other | Admitting: *Deleted

## 2012-07-26 ENCOUNTER — Encounter: Payer: Self-pay | Admitting: Cardiovascular Disease

## 2012-07-26 ENCOUNTER — Ambulatory Visit: Payer: Medicare Other | Admitting: *Deleted

## 2012-07-26 VITALS — BP 167/86 | HR 66 | Wt 206.0 lb

## 2012-07-26 DIAGNOSIS — Z950 Presence of cardiac pacemaker: Secondary | ICD-10-CM

## 2012-07-26 DIAGNOSIS — I10 Essential (primary) hypertension: Secondary | ICD-10-CM

## 2012-07-26 DIAGNOSIS — I2510 Atherosclerotic heart disease of native coronary artery without angina pectoris: Secondary | ICD-10-CM

## 2012-07-26 DIAGNOSIS — R21 Rash and other nonspecific skin eruption: Secondary | ICD-10-CM

## 2012-07-26 DIAGNOSIS — I4891 Unspecified atrial fibrillation: Secondary | ICD-10-CM

## 2012-07-26 DIAGNOSIS — N259 Disorder resulting from impaired renal tubular function, unspecified: Secondary | ICD-10-CM

## 2012-07-26 LAB — BASIC METABOLIC PANEL
Calcium: 9.1 mg/dL (ref 8.4–10.5)
GFR: 17.84 mL/min — ABNORMAL LOW (ref >60.00)
Sodium: 134 mEq/L — ABNORMAL LOW (ref 135–145)

## 2012-07-26 NOTE — Telephone Encounter (Signed)
RECEIVED CALL FROM [place]  [facility]  APPT MADE  WITH DR Leta Speller FOR TOM 07-27-12 AT 3:40 PM PT AWARE  NEEDS TO BE THERE AT   3:20 TO CHECK IN .Zack Seal

## 2012-07-26 NOTE — Assessment & Plan Note (Signed)
Normal function f/U pacer clinic in 3 months

## 2012-07-26 NOTE — Assessment & Plan Note (Signed)
No clear etiology and very bothersome  Personally called Dr Johnn Hai office for follow up visit.  Has seen her before

## 2012-07-26 NOTE — Progress Notes (Signed)
Patient ID: [name], female   DOB: September 09, 1934, 77 y.o.   MRN: [id number] Jeraline is seen today for f/u CAD  She has multiple issues which include CAD, s/p stenting of the LCX as well as the LAD in the past. Last Myoview was in 2010 and showed no scar or ischemia. EF 79%. Last cath in May of 2012 with patent stent in the LAD, 30% distal to the stent, mLAD 30%, ostial first septal branch 80%, OM1 stent patent with 10 to 20% ISR, mLCX 20%, pRCA 30 to 40%, mRCA of 50 to 60%. She has been managed medically. Last echo in April of 2013 and showed EF of 55 to 60%, mild to moderate MR, moderate LAE, mild RVE, mild RAE, PASP 39. Other history includes chronic atrial fib, prior AV nodal ablation with implantation of a BIv pacer, diastolic CHF, and CKD.  She was admitted back in October with chest pain and acute on chronic heart failure. Gently diuresed. Imdur was added. Seen by Tereso Newcomer, PA at the end of October and felt to be doing ok.  Seen by PA 10 days ago . She called last week to report daily episodes of chest pain for the prior 3 weeks. Basically started since her last visit here. Was happening every morning after she got up. Using NTG 1 to 2 tabs with relief. BP has been ok. Not exertional in nature but she is not very active as a general rule. She now says that this has all stopped. Not sure why. She is here now because of terrible itching all over. Has been present since her admission last month. She scratches constantly. Took a short course of prednisone with no relief. Only new medicine is her Imdur. Weight fluctuates. Tries to not use salt. Does weigh daily but does not take extra lasix due to chronic urinary frequency anyways.   Currnently not having angina.  Has had a rash since hospital D/C at end of October Stopping imdur did not help.  Reviewed D/C summary and no other new meds started.    ROS: Denies fever, malais, weight loss, blurry vision, decreased visual acuity, cough, sputum, SOB,  hemoptysis, pleuritic pain, palpitaitons, heartburn, abdominal pain, melena, lower extremity edema, claudication, or rash.  All other systems reviewed and negative  General: Affect appropriate Healthy:  appears stated age HEENT: normal Neck supple with no adenopathy JVP normal no bruits no thyromegaly Lungs clear with no wheezing and good diaphragmatic motion Heart:  S1/S2 systolic murmur, no rub, gallop or click PMI normal Abdomen: benighn, BS positve, no tenderness, no AAA no bruit.  No HSM or HJR Distal pulses intact with no bruits No edema Neuro non-focal Skin warm and dry excoriations and rash on arms and stomach No muscular weakness   Current Outpatient Prescriptions  Medication Sig Dispense Refill  . ALPRAZolam (XANAX) 0.5 MG tablet Take 0.5 mg by mouth 2 (two) times daily.      Marland Kitchen amLODipine (NORVASC) 5 MG tablet Take 5 mg by mouth daily.        Marland Kitchen aspirin EC 81 MG tablet Take 81 mg by mouth daily.      . ferrous sulfate 325 (65 FE) MG tablet Take 325 mg by mouth 2 (two) times daily after a meal.      . furosemide (LASIX) 40 MG tablet Take 40 mg by mouth daily.      . isosorbide mononitrate (IMDUR) 30 MG 24 hr tablet Take 1 tablet (30 mg total) by  mouth daily.  30 tablet  6  . lovastatin (MEVACOR) 10 MG tablet Take 10 mg by mouth at bedtime.        . magnesium oxide (MAG-OX) 400 MG tablet Take 400 mg by mouth at bedtime.       . metoprolol (LOPRESSOR) 100 MG tablet Take 100 mg by mouth 2 (two) times daily.      Marland Kitchen oxyCODONE-acetaminophen (PERCOCET) 5-325 MG per tablet Take 0.5-1 tablets by mouth 3 (three) times daily as needed. For pain      . pantoprazole (PROTONIX) 40 MG tablet Take 40 mg by mouth 2 (two) times daily.       . paricalcitol (ZEMPLAR) 1 MCG capsule Take 1 mcg by mouth at bedtime.      Marland Kitchen spironolactone (ALDACTONE) 25 MG tablet Take 12.5 mg by mouth daily.      Marland Kitchen warfarin (COUMADIN) 2 MG tablet Take 2-3 mg by mouth at bedtime. Pt takes 1 & 1/2 tabs on monday,  Friday, and 1 tab all other days      . [DISCONTINUED] FLUoxetine (PROZAC) 20 MG capsule Take 20 mg by mouth daily.          Allergies  Amiodarone and Propylthiouracil  Electrocardiogram:  11/25  V pacing 71 with underlying afib  Assessment and Plan

## 2012-07-26 NOTE — Patient Instructions (Addendum)
Your physician recommends that you schedule a follow-up appointment in: 2 MONTHS WITH DR [facility] Your physician recommends that you continue on your current medications as directed. Please refer to the Current Medication list given to you today.  NEEDS APPT WITH DR Nicholas Lose AT [facility]  [phone]

## 2012-07-26 NOTE — Assessment & Plan Note (Signed)
Well controlled.  Continue current medications and low sodium Dash type diet.    

## 2012-07-26 NOTE — Assessment & Plan Note (Signed)
Tough balance with LE edema and Cr currently stable continue current dose of diuretic

## 2012-07-26 NOTE — Assessment & Plan Note (Signed)
Chronic with good rate control Pacer back up .  F/U coumadin clinic.  No bleeding problems

## 2012-07-26 NOTE — Assessment & Plan Note (Signed)
Stable with no more pain Have been hesitant to recath due to Cr 2.5 and coumadin.  Resume imdur

## 2012-07-30 ENCOUNTER — Telehealth: Payer: Self-pay | Admitting: *Deleted

## 2012-07-30 NOTE — Telephone Encounter (Signed)
s/w pt's son Reita Cliche who is aware of lab results and when asked about repeat bmet 1 week; son states pt has Nephro appt 12/23. I stated that I will send lab results to Dr. Hadley Pen office today with asking for bmet that day w/results faxed to SW, [facility]

## 2012-07-30 NOTE — Telephone Encounter (Signed)
Message copied by Tarri Fuller on Mon Jul 30, 2012  9:48 AM ------      Message from: [place], [place] T      Created: Fri Jul 27, 2012 10:19 AM       Creatinine stable.      K+ low      Increase dietary K+ for 3 days.      Repeat BMET in 1 week.      Tereso Newcomer, PA-C  10:19 AM 07/27/2012

## 2012-09-21 ENCOUNTER — Telehealth: Payer: Self-pay | Admitting: Cardiovascular Disease

## 2012-09-21 DIAGNOSIS — Z79899 Other long term (current) drug therapy: Secondary | ICD-10-CM

## 2012-09-21 DIAGNOSIS — R609 Edema, unspecified: Secondary | ICD-10-CM

## 2012-09-21 NOTE — Telephone Encounter (Signed)
SPOKE WITH PT   C/O  WEIGHT GAIN ,  DID TAKE AN EXTRA 40 MG OF LASIX  TODAY  ALONG WITH REG DOSE OF SPIRONOLACTONE 12.5 MG  LAST BUN WAS 31 AND CR WAS 2.8 FROM DEC. PT ALSO ADMITS  WITH CHEATING ON DIET AND NOT WATCHING SALT INTAKE AS CLOSELY AS SHE SHOULD,  DOES NOT COMPLAIN OF ANY SOB  ,HAS APPT  IN THE NEXT COUPLE OF WEEKS WITH DR [name].ENCOURAGE TO WATCH DIET AND TO CALL IF S/S WORSEN   WILL FORWARD TO DR [name] FOR REVIEW .Zack Seal

## 2012-09-21 NOTE — Telephone Encounter (Signed)
NEW PROBLEM     Pt is in heart failure program. Pt's weight is increasing quickly. Is up to 10 lb weight gain. In addition, pt is complaining of swelling. Nurse is concerned and hasn't been able to contact pt. Wanted to give heads up to office about her condition.

## 2012-09-24 ENCOUNTER — Telehealth: Payer: Self-pay | Admitting: Cardiovascular Disease

## 2012-09-24 NOTE — Telephone Encounter (Signed)
SPOKE WITH AMY  PT IS NOW UP  12 LBS  FEET  ARE STILL  SWOLLEN WILL FORWARD TO DR [name] FOR  REVIEW .Zack Seal

## 2012-09-24 NOTE — Telephone Encounter (Signed)
error 

## 2012-09-24 NOTE — Telephone Encounter (Signed)
Per Amy pt is still 12 lbs on her weight gain and she wants to know is there any other recommendations

## 2012-09-25 NOTE — Telephone Encounter (Signed)
Stop cheating on diet check BMET and BNP next week and continue diuretics.  Make appt with PA next week

## 2012-09-26 ENCOUNTER — Telehealth: Payer: Self-pay | Admitting: Cardiovascular Disease

## 2012-09-26 NOTE — Telephone Encounter (Signed)
LMTCB ./CY 

## 2012-09-26 NOTE — Telephone Encounter (Signed)
MEDS NOT CHANGED  WILL AWAIT LAB RESULTS .LAB TO BE DONE NEXT WEEK .Zack Seal

## 2012-09-26 NOTE — Telephone Encounter (Signed)
New Problem:    Patient called in wanting to speak with you about her insurance company calling her for authorization to increase the dose of her fluid pills.  Please call back.

## 2012-09-26 NOTE — Telephone Encounter (Signed)
SPOKE WITH PT  WILL COME IN  FOR LABS  NEXT WEEK , BUT HAS AN APPT WITH DR [name] ON 10-09-12  WISHES TO KEEP APPT  WITH MD AND NOT SEE PA  NEXT WEEK   PER PT FEELS FINE PT STATES HOME HEALTH NURSE IS WORRYING HER TO DEATH  WILL KEEP DR Ricki Miller APPT  AS SCHEDULED .Zack Seal

## 2012-10-03 ENCOUNTER — Other Ambulatory Visit (INDEPENDENT_AMBULATORY_CARE_PROVIDER_SITE_OTHER): Payer: Medicare Other

## 2012-10-03 DIAGNOSIS — Z79899 Other long term (current) drug therapy: Secondary | ICD-10-CM

## 2012-10-03 DIAGNOSIS — R609 Edema, unspecified: Secondary | ICD-10-CM

## 2012-10-03 LAB — BASIC METABOLIC PANEL
Calcium: 9.9 mg/dL (ref 8.4–10.5)
GFR: 15.36 mL/min — ABNORMAL LOW (ref >60.00)
Potassium: 3.6 mEq/L (ref 3.5–5.1)
Sodium: 136 mEq/L (ref 135–145)

## 2012-10-04 NOTE — Telephone Encounter (Signed)
Follow Up   Calling to see if we received a fax from them in regards to a device.

## 2012-10-04 NOTE — Telephone Encounter (Signed)
Will forward to [place] to follow up on fax.

## 2012-10-09 ENCOUNTER — Ambulatory Visit (INDEPENDENT_AMBULATORY_CARE_PROVIDER_SITE_OTHER): Payer: Medicare Other | Admitting: Cardiovascular Disease

## 2012-10-09 VITALS — BP 138/72 | HR 74 | Ht 67.0 in | Wt 208.6 lb

## 2012-10-09 DIAGNOSIS — I10 Essential (primary) hypertension: Secondary | ICD-10-CM

## 2012-10-09 DIAGNOSIS — I498 Other specified cardiac arrhythmias: Secondary | ICD-10-CM

## 2012-10-09 DIAGNOSIS — I4891 Unspecified atrial fibrillation: Secondary | ICD-10-CM

## 2012-10-09 DIAGNOSIS — I2510 Atherosclerotic heart disease of native coronary artery without angina pectoris: Secondary | ICD-10-CM

## 2012-10-09 DIAGNOSIS — R609 Edema, unspecified: Secondary | ICD-10-CM

## 2012-10-09 DIAGNOSIS — F329 Major depressive disorder, single episode, unspecified: Secondary | ICD-10-CM

## 2012-10-09 MED ORDER — FUROSEMIDE 40 MG PO TABS: 40.0000 mg | ORAL_TABLET | Freq: Two times a day (BID) | ORAL | Status: DC

## 2012-10-09 NOTE — Assessment & Plan Note (Signed)
Normal pacer function f/u EP 

## 2012-10-09 NOTE — Assessment & Plan Note (Signed)
Good rate control Contnue coumadin

## 2012-10-09 NOTE — Progress Notes (Signed)
Patient ID: [name], female   DOB: Dec 21, 1934, 77 y.o.   MRN: [id number] [name] is seen today for f/u CAD She has multiple issues which include CAD, s/p stenting of the LCX as well as the LAD in the past. Last Myoview was in 2010 and showed no scar or ischemia. EF 79%. Last cath in May of 2012 with patent stent in the LAD, 30% distal to the stent, mLAD 30%, ostial first septal branch 80%, OM1 stent patent with 10 to 20% ISR, mLCX 20%, pRCA 30 to 40%, mRCA of 50 to 60%. She has been managed medically. Last echo in April of 2013 and showed EF of 55 to 60%, mild to moderate MR, moderate LAE, mild RVE, mild RAE, PASP 39. Other history includes chronic atrial fib, prior AV nodal ablation with implantation of a BIv pacer, diastolic CHF, and CKD.  She was admitted back in October with chest pain and acute on chronic heart failure. Gently diuresed. Imdur was added. Seen by Tereso Newcomer, PA at the end of October and felt to be doing ok.  Seen by PA 10 days ago . She called last week to report daily episodes of chest pain for the prior 3 weeks. Basically started since her last visit here. Was happening every morning after she got up. Using NTG 1 to 2 tabs with relief. BP has been ok. Not exertional in nature but she is not very active as a general rule. She now says that this has all stopped. Not sure why. She is here now because of terrible itching all over. Has been present since her admission last month. She scratches constantly. Took a short course of prednisone with no relief. Only new medicine is her Imdur. Weight fluctuates. Tries to not use salt. Does weigh daily but does not take extra lasix due to chronic urinary frequency anyways.  Currnently not having angina.   More LE edema needs to be back on lasix 40 bid.  Does not want dialysis .  Has more right knee pain.  Dr Herbert Seta operated on left before.  Takes tylenol and percocet for pain  ROS: Denies fever, malais, weight loss, blurry vision, decreased  visual acuity, cough, sputum, SOB, hemoptysis, pleuritic pain, palpitaitons, heartburn, abdominal pain, melena, lower extremity edema, claudication, or rash.  All other systems reviewed and negative  General: Affect appropriate Healthy:  appears stated age HEENT: normal Neck supple with no adenopathy JVP normal no bruits no thyromegaly Lungs clear with no wheezing and good diaphragmatic motion Heart:  S1/S2 no murmur, no rub, gallop or click PMI normal Abdomen: benighn, BS positve, no tenderness, no AAA no bruit.  No HSM or HJR Distal pulses intact with no bruits Plus 2 bilateral edema Neuro non-focal Skin warm and dry No muscular weakness   Current Outpatient Prescriptions  Medication Sig Dispense Refill  . ALPRAZolam (XANAX) 0.5 MG tablet Take 0.5 mg by mouth 3 (three) times daily.       Marland Kitchen amLODipine (NORVASC) 5 MG tablet Take 5 mg by mouth daily.        Marland Kitchen aspirin EC 81 MG tablet Take 81 mg by mouth daily. Does not take daily only when she feels like taking it      . ferrous sulfate 325 (65 FE) MG tablet Take 325 mg by mouth 2 (two) times daily after a meal.      . furosemide (LASIX) 40 MG tablet Take 40 mg by mouth daily.      Marland Kitchen  isosorbide mononitrate (IMDUR) 30 MG 24 hr tablet Take 1 tablet (30 mg total) by mouth daily.  30 tablet  6  . lovastatin (MEVACOR) 10 MG tablet Take 10 mg by mouth at bedtime.        . magnesium oxide (MAG-OX) 400 MG tablet Take 400 mg by mouth at bedtime.       . metoprolol (LOPRESSOR) 100 MG tablet Take 100 mg by mouth 2 (two) times daily.      Marland Kitchen oxyCODONE-acetaminophen (PERCOCET) 5-325 MG per tablet Take 0.5-1 tablets by mouth 3 (three) times daily as needed. For pain      . pantoprazole (PROTONIX) 40 MG tablet Take 40 mg by mouth 2 (two) times daily.       . paricalcitol (ZEMPLAR) 1 MCG capsule Take 1 mcg by mouth at bedtime.      Marland Kitchen spironolactone (ALDACTONE) 25 MG tablet Take 12.5 mg by mouth daily.      Marland Kitchen warfarin (COUMADIN) 2 MG tablet Take 2-3  mg by mouth at bedtime. Pt takes 1 & 1/2 tabs on monday, Friday, and 1 tab all other days      . [DISCONTINUED] FLUoxetine (PROZAC) 20 MG capsule Take 20 mg by mouth daily.         No current facility-administered medications for this visit.    Allergies  Amiodarone and Propylthiouracil  Electrocardiogram:  afib with V pacing  07/09/12   Assessment and Plan

## 2012-10-09 NOTE — Patient Instructions (Addendum)
Your physician wants you to follow-up in:   6 MONTHS WITH DR Haywood Filler will receive a reminder letter in the mail two months in advance. If you don't receive a letter, please call our office to schedule the follow-up appointment. Your physician has recommended you make the following change in your medication: INCREASE FUROSEMIDE TO  40 MG  TWICE DAILY

## 2012-10-09 NOTE — Telephone Encounter (Signed)
COMPANY AWARE HAVE NOT RECEIVED ANY FAX  ON PT  FOR ANY EQUIPMENT./CY

## 2012-10-09 NOTE — Assessment & Plan Note (Signed)
F/U Dr Clarene Duke should be on antidepressant.  Brothers death still bothering her

## 2012-10-09 NOTE — Assessment & Plan Note (Signed)
Related to chronic knee issues and CRF  Increase lasix to 40 bid and will have to live with some prerenal azotemia

## 2012-10-09 NOTE — Assessment & Plan Note (Signed)
Stable with no angina and good activity level.  Continue medical Rx  

## 2012-10-09 NOTE — Assessment & Plan Note (Signed)
Well controlled.  Continue current medications and low sodium Dash type diet.    

## 2012-10-26 NOTE — Telephone Encounter (Signed)
AMY  FROM [place] NOT SURE WHAT WAS NEEDED WILL INVESTIGATE AND WILL HAVE SOMEONE SEND APPROPRIATE  PAPER WORK TO MY ATTENTION  WILL AWAIT  PAPERWORK./CY

## 2012-10-26 NOTE — Telephone Encounter (Signed)
LMTCB ./CY 

## 2012-11-06 ENCOUNTER — Telehealth: Payer: Self-pay | Admitting: Internal Medicine

## 2012-11-06 NOTE — Telephone Encounter (Signed)
Message sent to Dr.Taylor's nurse.

## 2012-11-06 NOTE — Telephone Encounter (Signed)
Spoke with [employer] rep- order for CHF disease management and a biometric device. I will forward to [name] to be looking for this and follow up on.

## 2012-11-06 NOTE — Telephone Encounter (Signed)
Follow up      Follow up from previous message: Rep stated dr was [name] but previous message stated Dr Ladona Ridgel

## 2012-11-06 NOTE — Telephone Encounter (Signed)
New problem     Status of fax that was sent over from [facility].

## 2012-11-08 NOTE — Telephone Encounter (Signed)
HAVE FORM AWAITING FOR MD'S SIGNATURE .Zack Seal

## 2012-11-13 NOTE — Telephone Encounter (Signed)
FORMS SIGNED AND FAXED TO COMPANY   TODAY./CY

## 2012-12-07 ENCOUNTER — Encounter: Payer: Medicare Other | Admitting: Cardiology

## 2012-12-24 ENCOUNTER — Telehealth: Payer: Self-pay | Admitting: Cardiovascular Disease

## 2012-12-24 NOTE — Telephone Encounter (Signed)
Patient called spoke to DOD Dr.Nahser he advised increase lasix to 80 mg twice a day for the next 3 days only,need appointment to see Dr.Nishan for frequent chest pain.Message sent to Dr.Nishan's nurse for appointment.Advised to go to ER if needed.

## 2012-12-24 NOTE — Telephone Encounter (Signed)
Returned call to Amy at [facility].She stated patient has gained 5 lbs within the last 4 days,swelling in lower legs and sob.Spoke to patient she stated she has been having frequent chest pain.Stated she cannot do any physical activity without having chest pain.No chest pain at present.Stated she is having swelling in lower legs and feet.Stated she also has sob even at rest.Stated took a extra lasix 12/23/12 with no results.Patient was told Dr.Nishan not in office today will speak with DOD Dr.Nahser and call her back.

## 2012-12-24 NOTE — Telephone Encounter (Signed)
New problem    In the heart failure program    C/O weight up today  211, swelling, sob .   Extra lasix was taken on yesterday.

## 2012-12-25 NOTE — Telephone Encounter (Signed)
SPOKE WITH PT RE MESSAGE  CONTINUES TO C/O   CHEST PAIN  ANS TAKES NTG AS DIRECTED ALSO EDEMA IS SOMEWHAT BETTER  SINCE  CHANGE WITH  DIURETIC  SEE MESSAGE    PT DECLINED PA APPT . APPT MADE WITH DR [name]   FOR 01-10-13 AT 2:45  REMINDED PT ON THE USE OF NTG   AND  IF AFTER  3 NTG NO RELIEF THAN NEEDS TO CALL  911  PT VERBALIZED UNDERSTANDING./CY

## 2013-01-10 ENCOUNTER — Ambulatory Visit: Payer: Medicare Other | Admitting: Cardiovascular Disease

## 2013-01-14 ENCOUNTER — Other Ambulatory Visit (HOSPITAL_COMMUNITY): Payer: Self-pay | Admitting: Cardiology

## 2013-01-15 ENCOUNTER — Encounter: Payer: Self-pay | Admitting: Internal Medicine

## 2013-01-15 ENCOUNTER — Ambulatory Visit (INDEPENDENT_AMBULATORY_CARE_PROVIDER_SITE_OTHER): Payer: Medicare Other | Admitting: Internal Medicine

## 2013-01-15 VITALS — BP 140/79 | HR 72 | Ht 67.0 in | Wt 208.0 lb

## 2013-01-15 DIAGNOSIS — I4891 Unspecified atrial fibrillation: Secondary | ICD-10-CM

## 2013-01-15 DIAGNOSIS — Z950 Presence of cardiac pacemaker: Secondary | ICD-10-CM

## 2013-01-15 DIAGNOSIS — I2510 Atherosclerotic heart disease of native coronary artery without angina pectoris: Secondary | ICD-10-CM

## 2013-01-15 LAB — PACEMAKER DEVICE OBSERVATION
BATTERY VOLTAGE: 2.97 V
LV LEAD THRESHOLD: 1 V
VENTRICULAR PACING PM: 95

## 2013-01-15 NOTE — Progress Notes (Signed)
HPI Ms. Pinho returns today for followup.   She is a very pleasant78 show woman with a history of symptomatic bradycardia, status post pacemaker insertion, hypertension, chronic renal insufficiency with stage III renal failure, and arthritis. She has atrial fibrillation and underwent AV node ablation and pacemaker insertion over 6 years ago.in the interim, she has been bothered by peripheral edema.she has minimal shortness of breath. No chest pain. Allergies  Allergen Reactions  . Amiodarone Hives  . Propylthiouracil Rash     Current Outpatient Prescriptions  Medication Sig Dispense Refill  . ALPRAZolam (XANAX) 0.5 MG tablet Take 0.5 mg by mouth 3 (three) times daily.       Marland Kitchen amLODipine (NORVASC) 5 MG tablet Take 5 mg by mouth daily.        Marland Kitchen aspirin EC 81 MG tablet Take 81 mg by mouth daily. Does not take daily only when she feels like taking it      . ferrous sulfate 325 (65 FE) MG tablet Take 325 mg by mouth 2 (two) times daily after a meal.      . furosemide (LASIX) 40 MG tablet Take 1 tablet (40 mg total) by mouth 2 (two) times daily.  60 tablet  11  . isosorbide mononitrate (IMDUR) 30 MG 24 hr tablet TAKE 1 TABLET BY MOUTH EVERY DAY  30 tablet  6  . lovastatin (MEVACOR) 10 MG tablet Take 10 mg by mouth at bedtime.        . magnesium oxide (MAG-OX) 400 MG tablet Take 400 mg by mouth at bedtime.       . metoprolol (LOPRESSOR) 100 MG tablet Take 100 mg by mouth 2 (two) times daily.      Marland Kitchen oxyCODONE-acetaminophen (PERCOCET) 5-325 MG per tablet Take 0.5-1 tablets by mouth 3 (three) times daily as needed. For pain      . pantoprazole (PROTONIX) 40 MG tablet Take 40 mg by mouth 2 (two) times daily.       . paricalcitol (ZEMPLAR) 1 MCG capsule Take 1 mcg by mouth at bedtime.      Marland Kitchen spironolactone (ALDACTONE) 25 MG tablet Take 12.5 mg by mouth daily.      Marland Kitchen warfarin (COUMADIN) 2 MG tablet Take 2-3 mg by mouth at bedtime. Pt takes 1 & 1/2 tabs on monday, Friday, and 1 tab all other days       . [DISCONTINUED] FLUoxetine (PROZAC) 20 MG capsule Take 20 mg by mouth daily.         No current facility-administered medications for this visit.     Past Medical History  Diagnosis Date  . Cardiac pacemaker in situ   . Cellulitis and abscess of unspecified site     a. h/o MSRA cellulitis of abdominal wall.  Marland Kitchen CAD (coronary artery disease)     a. DES to OM 01/2005. b. Rotational atherotomy/DES to prox LAD 02/2008.  . Edema   . Chronic renal insufficiency     Cr ~2.3.  . Hypertension   . GERD (gastroesophageal reflux disease)   . Depression   . Chronic diastolic CHF (congestive heart failure)     a. EF 55-60% 11/2011, dry weight 200.  . Warfarin anticoagulation     a. Followed by PCP.  Marland Kitchen GI bleed     a. EGD 01/2007: antral ulcers related to NSAID/aspirin use.  . SSS (sick sinus syndrome)     a. H/o AV node ablation 2008 secondary to difficult to control afib/flutter, with subsequent pacer.  Marland Kitchen  Atrial fibrillation with rapid ventricular response     a. H/o amiodarone thyroiditis. b. s/p AV node ablation 2008 with implantation of PPM 01/2007, upgraded to CRT-P 06/2007.  Marland Kitchen Polycystic kidney disease     a. Per remote E-Chart records.  . Peripheral vascular disease   . Hyperthyroidism     a. Hx of intolerance to PTU.  Marland Kitchen Anemia, unspecified   . Blood transfusion   . H/O hiatal hernia   . Umbilical hernia     unrepaired (11/24/11)  . Migraines 11/24/11    "I've always had them"  . Arthritis   . Gout     "get it in my feet"  . Valvular heart disease     Echo 11/2011: mild-mod MR.    ROS:   All systems reviewed and negative except as noted in the HPI.   Past Surgical History  Procedure Laterality Date  . Esophagogastroduodenoscopy    . Tonsillectomy  ~ 1960  . Coronary angioplasty      percutanous transluminal   . Cardiac catheterization  2006; 2008; 2009; 01/13/2011  . Back surgery    . Laminectomy and microdiscectomy lumbar spine  08/2003  . Cardioversion  10/2004; 02/2007     /E-chart  . Cholecystectomy    . Appendectomy  1954  . Vaginal hysterectomy  ?1981  . Av node ablation  02/2007    /E-chart  . Insert / replace / remove pacemaker  01/2007    initial placement  . Insert / replace / remove pacemaker  06/2007    upgraded/E-chart  . Av fistula placement, brachiocephalic  09/2007    right; "didn't work; my veins were too small"  . Joint replacement    . Total knee arthroplasty  01/2009    left  . Bladder and bowel  1970's    "tacked; damaged my intestines & had to remove some; done @ [facility]"     Family History  Problem Relation Age of Onset  . Colon cancer Neg Hx   . Thyroid disease Neg Hx   . Stroke Brother   . Kidney disease Brother   . Other Mother     Sepsis and perforated colon  . Other Father     Motor vehicle accident     History   Social History  . Marital Status: Widowed    Spouse Name: N/A    Number of Children: N/A  . Years of Education: N/A   Occupational History  . Not on file.   Social History Main Topics  . Smoking status: Former Smoker -- 1.00 packs/day for 50 years    Types: Cigarettes    Quit date: 09/15/2004  . Smokeless tobacco: Never Used  . Alcohol Use: No  . Drug Use: No  . Sexually Active: No   Other Topics Concern  . Not on file   Social History Narrative  . No narrative on file     BP 140/79  Pulse 72  Ht 5\' 7"  (1.702 m)  Wt 208 lb (94.348 kg)  BMI 32.57 kg/m2  Physical Exam:  Well appearing elderly woman,NAD HEENT: Unremarkable Neck:  7 cm JVD, no thyromegally Back:  No CVA tenderness Lungs:  Clear except for scattered basilar rales. No wheezes. No rhonchi. HEART:  Regular rate rhythm, no murmurs, no rubs, no clicks Abd:  soft, positive bowel sounds, no organomegally, no rebound, no guarding Ext:  2 plus pulses, 1+ bilaterally edema, no cyanosis, no clubbing Skin:  No rashes no nodules  Neuro:  CN II through XII intact, motor grossly intact  EKG - atrial fibrillation with  biventricular pacing  DEVICE  Normal device function.  See PaceArt for details.   Assess/Plan:

## 2013-01-15 NOTE — Patient Instructions (Addendum)
Your physician wants you to follow-up in: 6 months in the device clinic and 12 months with Dr Taylor You will receive a reminder letter in the mail two months in advance. If you don't receive a letter, please call our office to schedule the follow-up appointment.  

## 2013-01-15 NOTE — Assessment & Plan Note (Signed)
Her ventricular rates are well controlled. She will continue her current medical therapy.

## 2013-01-15 NOTE — Assessment & Plan Note (Signed)
She denies anginal symptoms. She will continue her current medical therapy. 

## 2013-01-15 NOTE — Assessment & Plan Note (Signed)
Her Medtronic biventricular pacemaker is working normally. We'll plan to recheck in several months. 

## 2013-02-18 ENCOUNTER — Telehealth: Payer: Self-pay | Admitting: Cardiovascular Disease

## 2013-02-18 NOTE — Telephone Encounter (Signed)
New Problem  Pt states she has been having some problems. She said her PCP believes it has to do with her heart because he did a lung test on her.  She said she is swelling up and having some shortness of breath.

## 2013-02-18 NOTE — Telephone Encounter (Signed)
SPOKE WITH  PT RE MESSAGE PT  COMPLAINING OF SWELLING   TO LEGS AND  STOMACH   JUST STARTED RECENTLY  WEIGHT LAST NOC WAS  215 # AND THIS AM WAS 212#  STATES HAS NOT  INCREASED ANY SALT  TO DIET  WOULD LIKE AN APPT, APPT MADE WITH BROOKE FOR TOM AT 1200

## 2013-02-19 ENCOUNTER — Encounter: Payer: Self-pay | Admitting: Cardiology

## 2013-02-19 ENCOUNTER — Other Ambulatory Visit: Payer: Self-pay | Admitting: Internal Medicine

## 2013-02-19 ENCOUNTER — Ambulatory Visit (INDEPENDENT_AMBULATORY_CARE_PROVIDER_SITE_OTHER): Payer: Medicare Other | Admitting: Cardiology

## 2013-02-19 ENCOUNTER — Telehealth: Payer: Self-pay | Admitting: Cardiology

## 2013-02-19 VITALS — BP 150/92 | HR 61 | Ht 67.0 in | Wt 206.1 lb

## 2013-02-19 DIAGNOSIS — I5032 Chronic diastolic (congestive) heart failure: Secondary | ICD-10-CM

## 2013-02-19 DIAGNOSIS — R001 Bradycardia, unspecified: Secondary | ICD-10-CM

## 2013-02-19 DIAGNOSIS — I2510 Atherosclerotic heart disease of native coronary artery without angina pectoris: Secondary | ICD-10-CM

## 2013-02-19 DIAGNOSIS — I498 Other specified cardiac arrhythmias: Secondary | ICD-10-CM

## 2013-02-19 DIAGNOSIS — R079 Chest pain, unspecified: Secondary | ICD-10-CM

## 2013-02-19 DIAGNOSIS — I4891 Unspecified atrial fibrillation: Secondary | ICD-10-CM

## 2013-02-19 DIAGNOSIS — Z950 Presence of cardiac pacemaker: Secondary | ICD-10-CM

## 2013-02-19 DIAGNOSIS — N183 Chronic kidney disease, stage 3 (moderate): Secondary | ICD-10-CM

## 2013-02-19 DIAGNOSIS — M7989 Other specified soft tissue disorders: Secondary | ICD-10-CM

## 2013-02-19 LAB — BASIC METABOLIC PANEL
BUN: 35 mg/dL — ABNORMAL HIGH (ref 6–23)
Chloride: 95 mEq/L — ABNORMAL LOW (ref 96–112)
Potassium: 3.5 mEq/L (ref 3.5–5.1)

## 2013-02-19 MED ORDER — ISOSORBIDE MONONITRATE ER 60 MG PO TB24
60.0000 mg | ORAL_TABLET | Freq: Every day | ORAL | Status: DC
Start: 2013-02-19 — End: 2013-07-01

## 2013-02-19 NOTE — Patient Instructions (Addendum)
Your physician has recommended you make the following change in your medication:   INCREASE YOUR IMDUR TO 60 MG ONCE DAILY  Your physician recommends that you return for lab work in: TODAY (BNP/BMET)  WILL CALL YOU BACK ABOUT YOUR LASIX DOSE AFTER LABS  KEEP APPOINTMENT WITH DR. [name] 9-3-@ 2:15 P.M

## 2013-02-20 ENCOUNTER — Telehealth: Payer: Self-pay | Admitting: Cardiovascular Disease

## 2013-02-20 NOTE — Telephone Encounter (Signed)
Follow Up ° ° ° ° °Pt calling in following up on test results. Please call. °

## 2013-02-20 NOTE — Telephone Encounter (Deleted)
error 

## 2013-02-21 NOTE — Telephone Encounter (Signed)
Reviewed lab results and plan of care with patient who verbalized understanding. Patient expressed gratitude and awareness of appointment on 7/15 with Rick Duff, PA-C

## 2013-02-21 NOTE — Telephone Encounter (Signed)
Follow up ° °Pt is calling regarding her lab results.  °

## 2013-02-25 NOTE — Progress Notes (Signed)
ELECTROPHYSIOLOGY OFFICE NOTE  Patient ID: [name] MRN: [id number], DOB/AGE: 1934-09-20   Date of Visit: 02/19/2013  Primary Physician: [name], MD Primary Cardiologist / EP: [name], MD / Ladona Ridgel, MD Reason for Visit: LE swelling  History of Present Illness  [name] is a 78 y.o. female with symptomatic bradycardia s/p PPM implant, CAD, chronic diastolic HF, HTN, CKD stage III and AF s/p AV node ablation who presents today as an add-on for evaluation of LE swelling. She reports increased LE swelling x 3 days. She has intermittent chest pain, relieved by SL NTG, which has been stable. Dr. [name] is aware and they are trying to manage her CAD medically given her CKD. She would like to avoid cardiac cath. She states her chest pain is not worsening. She denies orthopnea or PND. She denies weight gain. She denies palpitations, dizziness, near syncope or syncope. She reports compliance with her medications. Of note, she tells me she increased her Lasix dose to twice daily 2 days ago.   Past Medical History Past Medical History  Diagnosis Date  . Cardiac pacemaker in situ   . Cellulitis and abscess of unspecified site     a. h/o MSRA cellulitis of abdominal wall.  Marland Kitchen CAD (coronary artery disease)     a. DES to OM 01/2005. b. Rotational atherotomy/DES to prox LAD 02/2008.  . Edema   . Chronic renal insufficiency     Cr ~2.3.  . Hypertension   . GERD (gastroesophageal reflux disease)   . Depression   . Chronic diastolic CHF (congestive heart failure)     a. EF 55-60% 11/2011, dry weight 200.  . Warfarin anticoagulation     a. Followed by PCP.  Marland Kitchen GI bleed     a. EGD 01/2007: antral ulcers related to NSAID/aspirin use.  . SSS (sick sinus syndrome)     a. H/o AV node ablation 2008 secondary to difficult to control afib/flutter, with subsequent pacer.  . Atrial fibrillation with rapid ventricular response     a. H/o amiodarone thyroiditis. b. s/p AV node ablation 2008 with  implantation of PPM 01/2007, upgraded to CRT-P 06/2007.  Marland Kitchen Polycystic kidney disease     a. Per remote E-Chart records.  . Peripheral vascular disease   . Hyperthyroidism     a. Hx of intolerance to PTU.  Marland Kitchen Anemia, unspecified   . Blood transfusion   . H/O hiatal hernia   . Umbilical hernia     unrepaired (11/24/11)  . Migraines 11/24/11    "I've always had them"  . Arthritis   . Gout     "get it in my feet"  . Valvular heart disease     Echo 11/2011: mild-mod MR.    Past Surgical History Past Surgical History  Procedure Laterality Date  . Esophagogastroduodenoscopy    . Tonsillectomy  ~ 1960  . Coronary angioplasty      percutanous transluminal   . Cardiac catheterization  2006; 2008; 2009; 01/13/2011  . Back surgery    . Laminectomy and microdiscectomy lumbar spine  08/2003  . Cardioversion  10/2004; 02/2007    /E-chart  . Cholecystectomy    . Appendectomy  1954  . Vaginal hysterectomy  ?1981  . Av node ablation  02/2007    /E-chart  . Insert / replace / remove pacemaker  01/2007    initial placement  . Insert / replace / remove pacemaker  06/2007    upgraded/E-chart  . Av fistula placement, brachiocephalic  09/2007  right; "didn't work; my veins were too small"  . Joint replacement    . Total knee arthroplasty  01/2009    left  . Bladder and bowel  1970's    "tacked; damaged my intestines & had to remove some; done @ [facility]"    Allergies/Intolerances Allergies  Allergen Reactions  . Amiodarone Hives  . Propylthiouracil Rash   Current Home Medications Current Outpatient Prescriptions  Medication Sig Dispense Refill  . ALPRAZolam (XANAX) 0.5 MG tablet Take 0.5 mg by mouth 2 (two) times daily.       Marland Kitchen amLODipine (NORVASC) 5 MG tablet Take 5 mg by mouth 2 (two) times daily.       Marland Kitchen aspirin EC 81 MG tablet Take 81 mg by mouth daily. Does not take daily only when she feels like taking it      . ferrous sulfate 325 (65 FE) MG tablet Take 325 mg by mouth daily with  breakfast.       . furosemide (LASIX) 40 MG tablet Take 1 tablet (40 mg total) by mouth 2 (two) times daily.  60 tablet  11  . isosorbide mononitrate (IMDUR) 60 MG 24 hr tablet Take 1 tablet (60 mg total) by mouth daily.  30 tablet  5  . lovastatin (MEVACOR) 10 MG tablet Take 10 mg by mouth at bedtime.        . magnesium oxide (MAG-OX) 400 MG tablet Take 400 mg by mouth at bedtime.       . metoprolol (LOPRESSOR) 100 MG tablet Take 50 mg by mouth 3 (three) times daily.       Marland Kitchen oxyCODONE-acetaminophen (PERCOCET) 5-325 MG per tablet Take 0.5-1 tablets by mouth 3 (three) times daily as needed. For pain      . pantoprazole (PROTONIX) 40 MG tablet Take 40 mg by mouth 2 (two) times daily.       . paricalcitol (ZEMPLAR) 1 MCG capsule Take 1 mcg by mouth at bedtime.      Marland Kitchen spironolactone (ALDACTONE) 25 MG tablet Take 12.5 mg by mouth daily.      Marland Kitchen warfarin (COUMADIN) 2 MG tablet Take 2-3 mg by mouth at bedtime. Pt takes 1 & 1/2 tabs on monday, Friday, and 1 tab all other days      . [DISCONTINUED] FLUoxetine (PROZAC) 20 MG capsule Take 20 mg by mouth daily.         No current facility-administered medications for this visit.   Social History History   Social History  . Marital Status: Widowed    Spouse Name: N/A    Number of Children: N/A  . Years of Education: N/A   Occupational History  . Not on file.   Social History Main Topics  . Smoking status: Former Smoker -- 1.00 packs/day for 50 years    Types: Cigarettes    Quit date: 09/15/2004  . Smokeless tobacco: Never Used  . Alcohol Use: No  . Drug Use: No  . Sexually Active: No   Other Topics Concern  . Not on file   Social History Narrative  . No narrative on file    Review of Systems General: No chills, fever, night sweats or weight changes Cardiovascular: No orthopnea, palpitations, paroxysmal nocturnal dyspnea Dermatological: No rash, lesions or masses Respiratory: No cough, dyspnea Urologic: No hematuria,  dysuria Abdominal: No nausea, vomiting, diarrhea, bright red blood per rectum, melena, or hematemesis Neurologic: No visual changes, weakness, changes in mental status All other systems reviewed and are  otherwise negative except as noted above.  Physical Exam Vitals: Blood pressure 150/92, pulse 61, height 5\' 7"  (1.702 m), weight 206 lb 1.9 oz (93.495 kg).  General: Well developed, well appearing 78 y.o. female in no acute distress. HEENT: Normocephalic, atraumatic. EOMs intact. Sclera nonicteric. Oropharynx clear.  Neck: Supple without bruits. No JVD. Lungs: Respirations regular and unlabored, CTA bilaterally. No wheezes, rales or rhonchi. Heart: RRR. S1, S2 present. No murmurs, rub, S3 or S4. Abdomen: Soft, non-tender, non-distended. BS present x 4 quadrants. No hepatosplenomegaly.  Extremities: No clubbing or cyanosis. Trace pedal edema bilaterally. PT/Radials 2+ and equal bilaterally. Psych: Normal affect. Neuro: Alert and oriented X 3. Moves all extremities spontaneously.   Diagnostics Device interrogation today - Normal PPM function. No programming changes made. See PaceArt for full details.  Assessment and Plan 1. LE swelling with known chronic diastolic HF Will check BMET and BNP Not too volume overloaded on exam today and weight stable at home Continue daily Lasix for now and adjust pending BMET results Counseled regarding sodium restriction and compression stockings Return in one week for follow-up 2. CAD Ms. Caffee reports her CP has been stable and Dr. [name] is aware Continue medical therapy 3. Bradycardia s/p PPM implant, AF s/p AV node ablation Normal device function No programming changes made See PaceArt report   This plan of care was formulated with Dr. Antoine Poche Signed, Xzayvion Vaeth, PA-C 02/25/2013, 9:13 AM

## 2013-02-26 ENCOUNTER — Ambulatory Visit (INDEPENDENT_AMBULATORY_CARE_PROVIDER_SITE_OTHER): Payer: Medicare Other | Admitting: Cardiology

## 2013-02-26 VITALS — BP 130/74 | HR 62 | Wt 213.4 lb

## 2013-02-26 DIAGNOSIS — R079 Chest pain, unspecified: Secondary | ICD-10-CM

## 2013-02-26 DIAGNOSIS — I2510 Atherosclerotic heart disease of native coronary artery without angina pectoris: Secondary | ICD-10-CM

## 2013-02-26 DIAGNOSIS — Z950 Presence of cardiac pacemaker: Secondary | ICD-10-CM

## 2013-02-26 DIAGNOSIS — M7989 Other specified soft tissue disorders: Secondary | ICD-10-CM

## 2013-02-26 DIAGNOSIS — Z9889 Other specified postprocedural states: Secondary | ICD-10-CM

## 2013-02-26 DIAGNOSIS — I498 Other specified cardiac arrhythmias: Secondary | ICD-10-CM

## 2013-02-26 DIAGNOSIS — I4891 Unspecified atrial fibrillation: Secondary | ICD-10-CM

## 2013-02-26 DIAGNOSIS — R001 Bradycardia, unspecified: Secondary | ICD-10-CM

## 2013-02-26 MED ORDER — FUROSEMIDE 40 MG PO TABS: 40.0000 mg | ORAL_TABLET | Freq: Two times a day (BID) | ORAL | Status: DC

## 2013-02-26 NOTE — Patient Instructions (Addendum)
Keep appointment with Dr. [name] 03-07-2013 at 9:45 a.m  Your physician has recommended you make the following change in your medication:   INCREASE  LASIX TO 40 MG TWICE DAILY FOR 5 DAYS. THEN   DECREASE LASIX BACK TO 40 MG ONCE A DAY UNTIL OFFICE VISIT WITH DR. [name]  Your physician recommends that you return for lab work in: YOU WILL BE GETTING KIDNEY CHECKED ON 03-07-2013 (BMET)

## 2013-02-26 NOTE — Progress Notes (Signed)
ELECTROPHYSIOLOGY OFFICE NOTE  Patient ID: [name] MRN: [id number], DOB/AGE: 19-May-1935   Date of Visit: 02/26/2013  Primary Physician: [name], MD Primary Cardiologist / EP: [name], MD / Ladona Ridgel, MD  Reason for Visit: LE swelling   History of Present Illness  [name] is a 78 y.o. female with symptomatic bradycardia s/p PPM implant, CAD, chronic diastolic HF, HTN, CKD stage III and AF s/p AV node ablation who presents today for follow-up regarding LE swelling. She reports increased LE swelling x 3 days. She has intermittent chest pain, relieved by SL NTG, which has been stable. Dr. [name] is aware and they are trying to manage her CAD medically given her CKD. She would like to avoid cardiac cath. She states her chest pain is not worsening. She denies orthopnea or PND. She denies weight gain. She denies palpitations, dizziness, near syncope or syncope. She reports compliance with her medications.   Since last being seen in our clinic, she reports she continues to have LE swelling. However, her weight remains stable. She denies chest pain or shortness of breath. She denies palpitations, dizziness, near syncope or syncope. She denies orthopnea, PND or recent weight gain. She is compliant and tolerating medications without difficulty.  Past Medical History Past Medical History  Diagnosis Date  . Cardiac pacemaker in situ   . Cellulitis and abscess of unspecified site     a. h/o MSRA cellulitis of abdominal wall.  Marland Kitchen CAD (coronary artery disease)     a. DES to OM 01/2005. b. Rotational atherotomy/DES to prox LAD 02/2008.  . Edema   . Chronic renal insufficiency     Cr ~2.3.  . Hypertension   . GERD (gastroesophageal reflux disease)   . Depression   . Chronic diastolic CHF (congestive heart failure)     a. EF 55-60% 11/2011, dry weight 200.  . Warfarin anticoagulation     a. Followed by PCP.  Marland Kitchen GI bleed     a. EGD 01/2007: antral ulcers related to NSAID/aspirin use.  .  SSS (sick sinus syndrome)     a. H/o AV node ablation 2008 secondary to difficult to control afib/flutter, with subsequent pacer.  . Atrial fibrillation with rapid ventricular response     a. H/o amiodarone thyroiditis. b. s/p AV node ablation 2008 with implantation of PPM 01/2007, upgraded to CRT-P 06/2007.  Marland Kitchen Polycystic kidney disease     a. Per remote E-Chart records.  . Peripheral vascular disease   . Hyperthyroidism     a. Hx of intolerance to PTU.  Marland Kitchen Anemia, unspecified   . Blood transfusion   . H/O hiatal hernia   . Umbilical hernia     unrepaired (11/24/11)  . Migraines 11/24/11    "I've always had them"  . Arthritis   . Gout     "get it in my feet"  . Valvular heart disease     Echo 11/2011: mild-mod MR.    Past Surgical History Past Surgical History  Procedure Laterality Date  . Esophagogastroduodenoscopy    . Tonsillectomy  ~ 1960  . Coronary angioplasty      percutanous transluminal   . Cardiac catheterization  2006; 2008; 2009; 01/13/2011  . Back surgery    . Laminectomy and microdiscectomy lumbar spine  08/2003  . Cardioversion  10/2004; 02/2007    /E-chart  . Cholecystectomy    . Appendectomy  1954  . Vaginal hysterectomy  ?1981  . Av node ablation  02/2007    /E-chart  .  Insert / replace / remove pacemaker  01/2007    initial placement  . Insert / replace / remove pacemaker  06/2007    upgraded/E-chart  . Av fistula placement, brachiocephalic  09/2007    right; "didn't work; my veins were too small"  . Joint replacement    . Total knee arthroplasty  01/2009    left  . Bladder and bowel  1970's    "tacked; damaged my intestines & had to remove some; done @ [facility]"    Allergies/Intolerances Allergies  Allergen Reactions  . Amiodarone Hives  . Propylthiouracil Rash   Current Home Medications Current Outpatient Prescriptions  Medication Sig Dispense Refill  . ALPRAZolam (XANAX) 0.5 MG tablet Take 0.5 mg by mouth 2 (two) times daily.       Marland Kitchen amLODipine  (NORVASC) 5 MG tablet Take 5 mg by mouth 2 (two) times daily.       Marland Kitchen aspirin EC 81 MG tablet Take 81 mg by mouth daily. Does not take daily only when she feels like taking it      . ferrous sulfate 325 (65 FE) MG tablet Take 325 mg by mouth daily with breakfast.       . furosemide (LASIX) 40 MG tablet Take 1 tablet (40 mg total) by mouth 2 (two) times daily.  60 tablet  0  . isosorbide mononitrate (IMDUR) 60 MG 24 hr tablet Take 1 tablet (60 mg total) by mouth daily.  30 tablet  5  . lovastatin (MEVACOR) 10 MG tablet Take 10 mg by mouth at bedtime.        . magnesium oxide (MAG-OX) 400 MG tablet Take 400 mg by mouth at bedtime.       . metoprolol (LOPRESSOR) 100 MG tablet Take 50 mg by mouth 3 (three) times daily.       Marland Kitchen oxyCODONE-acetaminophen (PERCOCET) 5-325 MG per tablet Take 0.5-1 tablets by mouth 3 (three) times daily as needed. For pain      . pantoprazole (PROTONIX) 40 MG tablet Take 40 mg by mouth 2 (two) times daily.       . paricalcitol (ZEMPLAR) 1 MCG capsule Take 1 mcg by mouth at bedtime.      Marland Kitchen spironolactone (ALDACTONE) 25 MG tablet Take 12.5 mg by mouth daily.      Marland Kitchen warfarin (COUMADIN) 2 MG tablet Take 2-3 mg by mouth at bedtime. Pt takes 1 & 1/2 tabs on monday, Friday, and 1 tab all other days      . [DISCONTINUED] FLUoxetine (PROZAC) 20 MG capsule Take 20 mg by mouth daily.         No current facility-administered medications for this visit.   Social History History   Social History  . Marital Status: Widowed    Spouse Name: N/A    Number of Children: N/A  . Years of Education: N/A   Occupational History  . Not on file.   Social History Main Topics  . Smoking status: Former Smoker -- 1.00 packs/day for 50 years    Types: Cigarettes    Quit date: 09/15/2004  . Smokeless tobacco: Never Used  . Alcohol Use: No  . Drug Use: No  . Sexually Active: No   Other Topics Concern  . Not on file   Social History Narrative  . No narrative on file    Review of  Systems General: No chills, fever, night sweats or weight changes Cardiovascular: No chest pain, dyspnea on exertion, edema, orthopnea, palpitations, paroxysmal  nocturnal dyspnea Dermatological: No rash, lesions or masses Respiratory: No cough, dyspnea Urologic: No hematuria, dysuria Abdominal: No nausea, vomiting, diarrhea, bright red blood per rectum, melena, or hematemesis Neurologic: No visual changes, weakness, changes in mental status All other systems reviewed and are otherwise negative except as noted above.  Physical Exam Vitals: Blood pressure 130/74, pulse 62, weight 213 lb 6.4 oz (96.798 kg), SpO2 97.00%.  General: Well developed, well appearing 78 y.o. female in no acute distress. HEENT: Normocephalic, atraumatic. EOMs intact. Sclera nonicteric. Oropharynx clear.  Neck: Supple without bruits. No JVD. Lungs: Respirations regular and unlabored, CTA bilaterally. No wheezes, rales or rhonchi. Heart: Regular. S1, S2 present. No murmurs, rub, S3 or S4. Abdomen: Soft, non-tender, non-distended. BS present x 4 quadrants. No hepatosplenomegaly.  Extremities: No clubbing or cyanosis. 1+ edema. DP/PT/Radials 2+ and equal bilaterally. Psych: Normal affect. Neuro: Alert and oriented X 3. Moves all extremities spontaneously.   Diagnostics/Labs BMET    Component Value Date/Time   NA 133* 02/19/2013 1341   K 3.5 02/19/2013 1341   CL 95* 02/19/2013 1341   CO2 31 02/19/2013 1341   GLUCOSE 92 02/19/2013 1341   BUN 35* 02/19/2013 1341   CREATININE 2.8* 02/19/2013 1341   CALCIUM 10.2 02/19/2013 1341   CALCIUM 8.7 03/02/2007 0505   GFRNONAA 15* 06/06/2012 0525   GFRAA 17* 06/06/2012 0525  BNP    Component Value Date/Time   PROBNP 382.0* 02/19/2013 1341   Assessment and Plan 1. LE swelling with known chronic diastolic HF   Not too volume overloaded on exam today and weight stable at home  Increase Lasix to 40 mg twice daily x 5 days and adjust pending BMET results  Counseled regarding sodium  restriction and compression stockings  Return in one week for follow-up  2. CAD Ms. Pieratt reports her CP has been stable and Dr. [name] is aware Imdur up-titrated last week  Continue medical therapy  3. Bradycardia s/p BiV PPM implant, AF s/p AV node ablation  Normal device function by interrogation last week No programming changes made  See [facility] report   This plan of care was formulated with Dr. Myrtis Ser Signed, Rissie Sculley, PA-C 02/26/2013, 9:12 PM

## 2013-02-28 ENCOUNTER — Telehealth: Payer: Self-pay | Admitting: Cardiovascular Disease

## 2013-02-28 ENCOUNTER — Encounter: Payer: Self-pay | Admitting: Cardiology

## 2013-02-28 NOTE — Telephone Encounter (Signed)
Was stable 2 days ago Can see me on 24th

## 2013-02-28 NOTE — Telephone Encounter (Signed)
New problem    Pt wants to know if she can be seen soon due to abd blotting and SOB

## 2013-02-28 NOTE — Telephone Encounter (Signed)
Spoke with patient who states [facility] nurse called [place] to ask if patient needed to be seen for weight gain and abdominal swelling.  Patient states that her daily weights are sent to [facility] and they were concerned with weight gain.  Patient states yesterday's weight was 212 lb 8 oz and today's weight is 211 lb.  Patient states that she is not increasingly SOB; states it is the same as when she saw Rick Duff, PA-C here in the office on 7/15.  Patient states when she gets up out of bed at night to urinate that she is SOB when she gets back to the bed and that it is difficult for her to bend forward and tie her shoes because it presses on her abdomen and is uncomfortable.  Patient states these symptoms began approximately 2 weeks ago.  Patient was instructed to take Lasix 40 mg BID x 5 days and then return to Lasix 40 mg QD until f/u with Dr. [name] on 7/24.  It is unclear as to whether patient has been following these instructions - at first she told me she had been taking Lasix 40 mg in the morning and then later she said that she has been taking Lasix 40 mg twice daily.  Patient states she is also taking Spironolactone 12.5 mg at bedtime as prescribed by her nephrologist.  Patient states that her lower extremity swelling is the same as when she came in for ov on 7/15 and states last BM was 7/16 x 2.  I reviewed patient's diet with her and asked her to replace the 3 cups of soda that she is drinking with water and the importance of weighing herself at the same time every morning in the same clothing, prior to breakfast but after emptying her bladder. Patient verbalized understanding.  I reviewed patient with Dr. Elease Hashimoto, DOD and he advised that patient continue Lasix 40 mg BID and to monitor daily weights and to notify us for weight gain of 3 lbs in one day and 5 lbs in one week and/or is symptoms worsen.  Patient verbalized agreement with plan of care and thanked [place] for our help.

## 2013-03-01 NOTE — Telephone Encounter (Signed)
PT AWARE TO KEEP APPT NEXT WEEK AND  TO  CONT  DIURETICS AS DIRECTED./CY

## 2013-03-05 LAB — PACEMAKER DEVICE OBSERVATION

## 2013-03-07 ENCOUNTER — Ambulatory Visit: Payer: Medicare Other | Admitting: Cardiovascular Disease

## 2013-03-27 ENCOUNTER — Ambulatory Visit (HOSPITAL_COMMUNITY): Payer: Medicare Other | Attending: Cardiovascular Disease | Admitting: Radiology

## 2013-03-27 ENCOUNTER — Encounter: Payer: Self-pay | Admitting: Nurse Practitioner

## 2013-03-27 ENCOUNTER — Ambulatory Visit (INDEPENDENT_AMBULATORY_CARE_PROVIDER_SITE_OTHER): Payer: Medicare Other | Admitting: Nurse Practitioner

## 2013-03-27 VITALS — BP 160/70 | HR 70 | Ht 67.0 in | Wt 216.8 lb

## 2013-03-27 DIAGNOSIS — I4891 Unspecified atrial fibrillation: Secondary | ICD-10-CM | POA: Insufficient documentation

## 2013-03-27 DIAGNOSIS — I5032 Chronic diastolic (congestive) heart failure: Secondary | ICD-10-CM

## 2013-03-27 DIAGNOSIS — I5043 Acute on chronic combined systolic (congestive) and diastolic (congestive) heart failure: Secondary | ICD-10-CM

## 2013-03-27 DIAGNOSIS — I2510 Atherosclerotic heart disease of native coronary artery without angina pectoris: Secondary | ICD-10-CM | POA: Insufficient documentation

## 2013-03-27 DIAGNOSIS — I509 Heart failure, unspecified: Secondary | ICD-10-CM | POA: Insufficient documentation

## 2013-03-27 DIAGNOSIS — R0602 Shortness of breath: Secondary | ICD-10-CM

## 2013-03-27 DIAGNOSIS — R609 Edema, unspecified: Secondary | ICD-10-CM | POA: Insufficient documentation

## 2013-03-27 LAB — CBC WITH DIFFERENTIAL/PLATELET
Basophils Absolute: 0 10*3/uL (ref 0.0–0.1)
Basophils Relative: 0.6 % (ref 0.0–3.0)
Eosinophils Absolute: 0.2 10*3/uL (ref 0.0–0.7)
Eosinophils Relative: 3.8 % (ref 0.0–5.0)
HCT: 37 % (ref 36.0–46.0)
Hemoglobin: 12.6 g/dL (ref 12.0–15.0)
Lymphocytes Relative: 12.9 % (ref 12.0–46.0)
Lymphs Abs: 0.8 10*3/uL (ref 0.7–4.0)
MCHC: 34 g/dL (ref 30.0–36.0)
MCV: 88.9 fl (ref 78.0–100.0)
Monocytes Absolute: 0.5 10*3/uL (ref 0.1–1.0)
Monocytes Relative: 8 % (ref 3.0–12.0)
Neutro Abs: 4.6 10*3/uL (ref 1.4–7.7)
Neutrophils Relative %: 74.7 % (ref 43.0–77.0)
Platelets: 219 10*3/uL (ref 150.0–400.0)
RBC: 4.17 Mil/uL (ref 3.87–5.11)
RDW: 13.8 % (ref 11.5–14.6)
WBC: 6.2 10*3/uL (ref 4.5–10.5)

## 2013-03-27 LAB — BASIC METABOLIC PANEL
BUN: 30 mg/dL — ABNORMAL HIGH (ref 6–23)
CO2: 27 mEq/L (ref 19–32)
Calcium: 9.5 mg/dL (ref 8.4–10.5)
Chloride: 95 mEq/L — ABNORMAL LOW (ref 96–112)
Creatinine, Ser: 3 mg/dL — ABNORMAL HIGH (ref 0.4–1.2)
GFR: 16.3 mL/min — ABNORMAL LOW (ref >60.00)
Glucose, Bld: 105 mg/dL — ABNORMAL HIGH (ref 70–99)
Potassium: 3.8 mEq/L (ref 3.5–5.1)
Sodium: 130 mEq/L — ABNORMAL LOW (ref 135–145)

## 2013-03-27 LAB — BRAIN NATRIURETIC PEPTIDE: Pro B Natriuretic peptide (BNP): 288 pg/mL — ABNORMAL HIGH (ref 0.0–100.0)

## 2013-03-27 MED ORDER — AMLODIPINE BESYLATE 5 MG PO TABS: 2.5000 mg | ORAL_TABLET | Freq: Every day | ORAL | Status: DC

## 2013-03-27 MED ORDER — HYDRALAZINE HCL 25 MG PO TABS: 25.0000 mg | ORAL_TABLET | Freq: Three times a day (TID) | ORAL | Status: DC

## 2013-03-27 NOTE — Patient Instructions (Addendum)
Keep restricting your salt  Keep on with daily weights and monitor your blood pressure at home  We need to get an ultrasound of your heart  Cut the Norvasc (amlodipine) in half (2.5 mg)  I am adding Hydralazine 25 mg three times a day  Stay on your current dose of Lasix for now  Continue with your other medicines  Try to get your legs wrapped in extra large ACE wraps from the bottom up to help with the swelling and elevate as much as possible  We need to check lab today  See Dr. [name] in 1 months  Call the [facility] office at [phone] if you have any questions, problems or concerns.

## 2013-03-27 NOTE — Progress Notes (Signed)
[name] Date of Birth: [birth year] Medical Record #[id number]  History of Present Illness: Ms. Gossard is seen back today for a one month check. Seen for Dr. Dalbert Mayotte. She has symptomatic bradycardia and has a PPM in place, CAD with past DES to the OM from 2006 and rotational atherotomy/DES to the proximal LAD in2009 - trying to now anage medically, chronic diastolic HF, HTN, GERD, PVD, hyperthyroidism, OA, gout, valvular heart disease with mild to moderate MR, stage III CKD, and AF with past AV node ablation.   Seen a month ago with swelling and chest pain. Trying to avoid cardiac cath because of her kidneys and manage her chest pain medically. She has had her medicines titrated and seemed to be better at her last visit. Was to have seen Dr. [name] a few weeks ago but looks like she has been cancelling OVs.   Comes back today to see me. Here with her son, [name]. Using a cane. Looks unsteady due to her bad hip. She thinks that she is doing about the same. Mostly worried about her swelling in her legs. Weight is up 3 pounds. Remains short of breath. Chest pain seems to be better with the increase in Imdur - not using any sl NTG now. BP running 140 to 150's at home. She has only been taking Norvasc once a day. Wants to try and avoid cath still. Some early satiety and abdominal bloating reported. Restricting her salt. Not using support stockings.    Current Outpatient Prescriptions  Medication Sig Dispense Refill  . ALPRAZolam (XANAX) 0.5 MG tablet Take 0.5 mg by mouth 2 (two) times daily.       Marland Kitchen amLODipine (NORVASC) 5 MG tablet Take 5 mg by mouth 2 (two) times daily. Only taking once a day      . aspirin EC 81 MG tablet Take 81 mg by mouth daily. Does not take daily only when she feels like taking it      . ferrous sulfate 325 (65 FE) MG tablet Take 325 mg by mouth daily with breakfast.       . furosemide (LASIX) 40 MG tablet Take 1 tablet (40 mg total) by mouth 2 (two) times daily.   60 tablet  0  . isosorbide mononitrate (IMDUR) 60 MG 24 hr tablet Take 1 tablet (60 mg total) by mouth daily.  30 tablet  5  . lovastatin (MEVACOR) 10 MG tablet Take 10 mg by mouth at bedtime.        . magnesium oxide (MAG-OX) 400 MG tablet Take 400 mg by mouth at bedtime.       . metoprolol (LOPRESSOR) 100 MG tablet Take 50 mg by mouth 3 (three) times daily.       Marland Kitchen oxyCODONE-acetaminophen (PERCOCET) 5-325 MG per tablet Take 0.5-1 tablets by mouth 3 (three) times daily as needed. For pain      . pantoprazole (PROTONIX) 40 MG tablet Take 40 mg by mouth 2 (two) times daily.       . paricalcitol (ZEMPLAR) 1 MCG capsule Take 1 mcg by mouth at bedtime.      Marland Kitchen spironolactone (ALDACTONE) 25 MG tablet Take 12.5 mg by mouth daily.      Marland Kitchen warfarin (COUMADIN) 2 MG tablet Take 2-3 mg by mouth at bedtime. Pt takes 1 & 1/2 tabs on monday, Friday, and 1 tab all other days      . [DISCONTINUED] FLUoxetine (PROZAC) 20 MG capsule Take 20 mg by mouth daily.  No current facility-administered medications for this visit.    Allergies  Allergen Reactions  . Amiodarone Hives  . Propylthiouracil Rash    Past Medical History  Diagnosis Date  . Cardiac pacemaker in situ   . Cellulitis and abscess of unspecified site     a. h/o MSRA cellulitis of abdominal wall.  Marland Kitchen CAD (coronary artery disease)     a. DES to OM 01/2005. b. Rotational atherotomy/DES to prox LAD 02/2008.  . Edema   . Chronic renal insufficiency     Cr ~2.3.  . Hypertension   . GERD (gastroesophageal reflux disease)   . Depression   . Chronic diastolic CHF (congestive heart failure)     a. EF 55-60% 11/2011, dry weight 200.  . Warfarin anticoagulation     a. Followed by PCP.  Marland Kitchen GI bleed     a. EGD 01/2007: antral ulcers related to NSAID/aspirin use.  . SSS (sick sinus syndrome)     a. H/o AV node ablation 2008 secondary to difficult to control afib/flutter, with subsequent pacer.  . Atrial fibrillation with rapid ventricular response      a. H/o amiodarone thyroiditis. b. s/p AV node ablation 2008 with implantation of PPM 01/2007, upgraded to CRT-P 06/2007.  Marland Kitchen Polycystic kidney disease     a. Per remote E-Chart records.  . Peripheral vascular disease   . Hyperthyroidism     a. Hx of intolerance to PTU.  Marland Kitchen Anemia, unspecified   . Blood transfusion   . H/O hiatal hernia   . Umbilical hernia     unrepaired (11/24/11)  . Migraines 11/24/11    "I've always had them"  . Arthritis   . Gout     "get it in my feet"  . Valvular heart disease     Echo 11/2011: mild-mod MR.    Past Surgical History  Procedure Laterality Date  . Esophagogastroduodenoscopy    . Tonsillectomy  ~ 1960  . Coronary angioplasty      percutanous transluminal   . Cardiac catheterization  2006; 2008; 2009; 01/13/2011  . Back surgery    . Laminectomy and microdiscectomy lumbar spine  08/2003  . Cardioversion  10/2004; 02/2007    /E-chart  . Cholecystectomy    . Appendectomy  1954  . Vaginal hysterectomy  ?1981  . Av node ablation  02/2007    /E-chart  . Insert / replace / remove pacemaker  01/2007    initial placement  . Insert / replace / remove pacemaker  06/2007    upgraded/E-chart  . Av fistula placement, brachiocephalic  09/2007    right; "didn't work; my veins were too small"  . Joint replacement    . Total knee arthroplasty  01/2009    left  . Bladder and bowel  1970's    "tacked; damaged my intestines & had to remove some; done @ [facility]"    History  Smoking status  . Former Smoker -- 1.00 packs/day for 50 years  . Types: Cigarettes  . Quit date: 09/15/2004  Smokeless tobacco  . Never Used    History  Alcohol Use No    Family History  Problem Relation Age of Onset  . Colon cancer Neg Hx   . Thyroid disease Neg Hx   . Stroke Brother   . Kidney disease Brother   . Other Mother     Sepsis and perforated colon  . Other Father     Motor vehicle accident    Review of  Systems: The review of systems is per the HPI.  All  other systems were reviewed and are negative.  Physical Exam: BP 160/70  Pulse 70  Ht 5\' 7"  (1.702 m)  Wt 216 lb 12.8 oz (98.34 kg)  BMI 33.95 kg/m2  SpO2 96% Patient is very pleasant and in no acute distress. She is obese. Weight is up 3 pounds. Skin is warm and dry. Color is normal.  HEENT is unremarkable. Normocephalic/atraumatic. PERRL. Sclera are nonicteric. Neck is supple. No masses. No JVD. Lungs are clear. Cardiac exam shows a regular rate and rhythm. Abdomen is soft. Extremities are with 2+ edema. Gait and ROM are intact. She is using a cane - looks a little unsteady. No gross neurologic deficits noted.  LABORATORY DATA: Pending  Lab Results  Component Value Date   WBC 7.4 07/09/2012   HGB 12.9 07/09/2012   HCT 39.7 07/09/2012   PLT 201.0 07/09/2012   GLUCOSE 92 02/19/2013   ALT 15 06/02/2012   AST 18 06/02/2012   NA 133* 02/19/2013   K 3.5 02/19/2013   CL 95* 02/19/2013   CREATININE 2.8* 02/19/2013   BUN 35* 02/19/2013   CO2 31 02/19/2013   TSH 4.93 07/09/2012   INR 2.11* 06/06/2012   Echo Study Conclusions from April 2013  - Left ventricle: The cavity size was normal. Wall thickness was normal. Systolic function was normal. The estimated ejection fraction was in the range of 55% to 60%. - Mitral valve: Mild to moderate regurgitation. - Left atrium: The atrium was moderately dilated. - Right ventricle: The cavity size was mildly dilated. - Right atrium: The atrium was mildly dilated. - Atrial septum: No defect or patent foramen ovale was identified. - Pulmonary arteries: PA peak pressure: 39mm Hg (S).   Assessment / Plan:  1. CAD - past PCIs - trying to manage medically and avoid repeat cath due to CKD - chest pain seems improved with nitrate therapy.   2. Swelling - on Norvasc - may be contributing. Will cut back to just 2.5 mg and leave her on her increased dose of Lasix for now. She was put on Aldactone by renal per her report. Try to use ACE wraps and keep on with  salt restriction.   3. Chronic diastolic HF - remains short of breath. Will update her echo. Diurese. Add hydralazine. Check BMET and BNP today.   4. HTN - adding Hydralazine today - she will continue to monitor at home.   5. CKD - followed by renal. Sees them next month per her report.   Unfortunately, if she fails to improve, may be forced to pursuing cardiac cath - hopefully we can keep trying to work on her medicines to improve her situation.   Patient is agreeable to this plan and will call if any problems develop in the interim.   Rosalio Macadamia, RN, ANP-C [facility] [address] [place], [place]  [address]

## 2013-03-27 NOTE — Progress Notes (Addendum)
Echocardiogram performed. Faxed to Dr. [name]

## 2013-03-28 ENCOUNTER — Telehealth: Payer: Self-pay | Admitting: Cardiovascular Disease

## 2013-03-28 DIAGNOSIS — I10 Essential (primary) hypertension: Secondary | ICD-10-CM

## 2013-03-28 MED ORDER — FUROSEMIDE 40 MG PO TABS: ORAL_TABLET | ORAL | Status: DC

## 2013-03-28 NOTE — Telephone Encounter (Signed)
New Problem    Pt inquiring about result from previous testing

## 2013-03-28 NOTE — Telephone Encounter (Signed)
Spoke to patient's son, Rod Mae, as patient has difficulty hearing on phone. Provided results from labs and Echo. Advised and educated on limiting free water. Patient's son stated that patient drinks 3-4 bottles of water daily. Encouraged patient to sip instead of drink during periods of thirst. Advised to decrease Lasix to 60mg  total daily (40mg  in AM, 20mg  in PM). Patient will come in for BMET in two weeks.

## 2013-03-28 NOTE — Telephone Encounter (Signed)
New Problem    callling to find out the results of her test.

## 2013-03-29 ENCOUNTER — Other Ambulatory Visit: Payer: Self-pay | Admitting: *Deleted

## 2013-03-29 DIAGNOSIS — N189 Chronic kidney disease, unspecified: Secondary | ICD-10-CM

## 2013-04-12 ENCOUNTER — Telehealth: Payer: Self-pay | Admitting: *Deleted

## 2013-04-12 ENCOUNTER — Other Ambulatory Visit (INDEPENDENT_AMBULATORY_CARE_PROVIDER_SITE_OTHER): Payer: Medicare Other

## 2013-04-12 DIAGNOSIS — E876 Hypokalemia: Secondary | ICD-10-CM

## 2013-04-12 DIAGNOSIS — N189 Chronic kidney disease, unspecified: Secondary | ICD-10-CM

## 2013-04-12 LAB — BASIC METABOLIC PANEL
BUN: 42 mg/dL — ABNORMAL HIGH (ref 6–23)
CO2: 26 mEq/L (ref 19–32)
Calcium: 9.8 mg/dL (ref 8.4–10.5)
Chloride: 93 mEq/L — ABNORMAL LOW (ref 96–112)
Creatinine, Ser: 3.2 mg/dL — ABNORMAL HIGH (ref 0.4–1.2)
GFR: 14.84 mL/min — CL (ref >60.00)
Glucose, Bld: 91 mg/dL (ref 70–99)
Potassium: 3.1 mEq/L — ABNORMAL LOW (ref 3.5–5.1)
Sodium: 132 mEq/L — ABNORMAL LOW (ref 135–145)

## 2013-04-12 MED ORDER — POTASSIUM CHLORIDE CRYS ER 10 MEQ PO TBCR: 10.0000 meq | EXTENDED_RELEASE_TABLET | Freq: Every day | ORAL | Status: DC

## 2013-04-12 MED ORDER — FUROSEMIDE 40 MG PO TABS: ORAL_TABLET | ORAL | Status: DC

## 2013-04-12 NOTE — Telephone Encounter (Signed)
Critical lab GFR 14.84,   Bun 42 Creat 3.22 Per Dr Ladona Ridgel,  Reduce lasix to 40 mg daily Start k+ 10 meg daily bmet on return visit 1.5 weeks. Spoke with son since she is HOH. He verbalized understanding.

## 2013-04-17 ENCOUNTER — Ambulatory Visit: Payer: Medicare Other | Admitting: Cardiovascular Disease

## 2013-04-17 ENCOUNTER — Encounter: Payer: Self-pay | Admitting: Cardiovascular Disease

## 2013-04-22 ENCOUNTER — Other Ambulatory Visit: Payer: Medicare Other

## 2013-04-22 ENCOUNTER — Encounter: Payer: Self-pay | Admitting: Nurse Practitioner

## 2013-04-22 ENCOUNTER — Ambulatory Visit (INDEPENDENT_AMBULATORY_CARE_PROVIDER_SITE_OTHER): Payer: Medicare Other | Admitting: Nurse Practitioner

## 2013-04-22 VITALS — BP 164/84 | HR 82 | Ht 67.0 in | Wt 207.0 lb

## 2013-04-22 DIAGNOSIS — I10 Essential (primary) hypertension: Secondary | ICD-10-CM

## 2013-04-22 NOTE — Patient Instructions (Addendum)
Stay on your current medicines  See Dr. [name] as planned next month  Keep monitoring your blood pressure at home  Call the [facility] office at [phone] if you have any questions, problems or concerns.

## 2013-04-22 NOTE — Progress Notes (Signed)
[name] Date of Birth: 1934-10-27 Medical Record #[id number]  History of Present Illness: [name] is seen back today for a 3 week check. Seen for [name]. Has symptomatic bradycardia with PPM in place, CAD with past DES to the OM from 2006 and rotational atherotomy/DES to the proximal LAD in 2009 - trying to manage medically. Other issues include chronic diastolic HF, HTN, GERD, PVD, hyperthyroidism, OA, gout, valvular heart disease with mild to moderate MR, stage III CKD and AF with past AV node ablation.   Seen more recently with swelling and chest pain - trying to avoid cath due to her kidney function.   I saw her 3 weeks ago - her chest pain did seem better but she was continuing to have issues with swelling. We cut her Norvasc back. Added Hydralazine. Updated her echo - which appears basically unchanged. Kidney function continues to decline and we had to cut her Lasix back. She remains on Aldactone per Renal.   Comes back today. Here with her son. She has had "lots of lab work" done by [name] just last week. Maybe looking at having a shunt placed for dialysis - she has not heard back yet about her labs. Her weight has been going down at home. BP at home looks good with the recent medicine changes that I made. Very little chest pain. Some shortness of breath - more so in the mornings. Overall, she feels like she is "holding her own" and is ok with how she currently feels.    Current Outpatient Prescriptions  Medication Sig Dispense Refill  . ALPRAZolam (XANAX) 0.5 MG tablet Take 0.5 mg by mouth 2 (two) times daily.       Marland Kitchen amLODipine (NORVASC) 5 MG tablet Take 0.5 tablets (2.5 mg total) by mouth daily.      Marland Kitchen aspirin EC 81 MG tablet Take 81 mg by mouth daily. Does not take daily only when she feels like taking it      . ferrous sulfate 325 (65 FE) MG tablet Take 325 mg by mouth daily with breakfast.       . furosemide (LASIX) 40 MG tablet Take 1 tablet (40mg )  by mouth in AM  90 tablet  3  . hydrALAZINE (APRESOLINE) 25 MG tablet Take 1 tablet (25 mg total) by mouth 3 (three) times daily.  90 tablet  3  . isosorbide mononitrate (IMDUR) 60 MG 24 hr tablet Take 1 tablet (60 mg total) by mouth daily.  30 tablet  5  . lovastatin (MEVACOR) 10 MG tablet Take 10 mg by mouth at bedtime.        . magnesium oxide (MAG-OX) 400 MG tablet Take 400 mg by mouth at bedtime.       . metoprolol (LOPRESSOR) 100 MG tablet Take 50 mg by mouth 3 (three) times daily.       Marland Kitchen oxyCODONE-acetaminophen (PERCOCET) 5-325 MG per tablet Take 0.5-1 tablets by mouth 3 (three) times daily as needed. For pain      . pantoprazole (PROTONIX) 40 MG tablet Take 40 mg by mouth 2 (two) times daily.       . paricalcitol (ZEMPLAR) 1 MCG capsule Take 1 mcg by mouth at bedtime.      . potassium chloride (K-DUR,KLOR-CON) 10 MEQ tablet Take 1 tablet (10 mEq total) by mouth daily.  30 tablet  6  . spironolactone (ALDACTONE) 25 MG tablet Take 12.5 mg by mouth daily.      Marland Kitchen warfarin (  COUMADIN) 2 MG tablet Take 2-3 mg by mouth at bedtime. Pt takes 1 & 1/2 tabs on monday, Friday, and 1 tab all other days      . [DISCONTINUED] FLUoxetine (PROZAC) 20 MG capsule Take 20 mg by mouth daily.         No current facility-administered medications for this visit.    Allergies  Allergen Reactions  . Amiodarone Hives  . Propylthiouracil Rash    Past Medical History  Diagnosis Date  . Cardiac pacemaker in situ   . Cellulitis and abscess of unspecified site     a. h/o MSRA cellulitis of abdominal wall.  Marland Kitchen CAD (coronary artery disease)     a. DES to OM 01/2005. b. Rotational atherotomy/DES to prox LAD 02/2008.  . Edema   . Chronic renal insufficiency     Cr ~2.3.  . Hypertension   . GERD (gastroesophageal reflux disease)   . Depression   . Chronic diastolic CHF (congestive heart failure)     a. EF 55-60% 11/2011, dry weight 200.  . Warfarin anticoagulation     a. Followed by PCP.  Marland Kitchen GI bleed     a.  EGD 01/2007: antral ulcers related to NSAID/aspirin use.  . SSS (sick sinus syndrome)     a. H/o AV node ablation 2008 secondary to difficult to control afib/flutter, with subsequent pacer.  . Atrial fibrillation with rapid ventricular response     a. H/o amiodarone thyroiditis. b. s/p AV node ablation 2008 with implantation of PPM 01/2007, upgraded to CRT-P 06/2007.  Marland Kitchen Polycystic kidney disease     a. Per remote E-Chart records.  . Peripheral vascular disease   . Hyperthyroidism     a. Hx of intolerance to PTU.  Marland Kitchen Anemia, unspecified   . Blood transfusion   . H/O hiatal hernia   . Umbilical hernia     unrepaired (11/24/11)  . Migraines 11/24/11    "I've always had them"  . Arthritis   . Gout     "get it in my feet"  . Valvular heart disease     Echo 11/2011: mild-mod MR.    Past Surgical History  Procedure Laterality Date  . Esophagogastroduodenoscopy    . Tonsillectomy  ~ 1960  . Coronary angioplasty      percutanous transluminal   . Cardiac catheterization  2006; 2008; 2009; 01/13/2011  . Back surgery    . Laminectomy and microdiscectomy lumbar spine  08/2003  . Cardioversion  10/2004; 02/2007    /E-chart  . Cholecystectomy    . Appendectomy  1954  . Vaginal hysterectomy  ?1981  . Av node ablation  02/2007    /E-chart  . Insert / replace / remove pacemaker  01/2007    initial placement  . Insert / replace / remove pacemaker  06/2007    upgraded/E-chart  . Av fistula placement, brachiocephalic  09/2007    right; "didn't work; my veins were too small"  . Joint replacement    . Total knee arthroplasty  01/2009    left  . Bladder and bowel  1970's    "tacked; damaged my intestines & had to remove some; done @ [facility]"    History  Smoking status  . Former Smoker -- 1.00 packs/day for 50 years  . Types: Cigarettes  . Quit date: 09/15/2004  Smokeless tobacco  . Never Used    History  Alcohol Use No    Family History  Problem Relation Age of Onset  .  Colon cancer  Neg Hx   . Thyroid disease Neg Hx   . Stroke Brother   . Kidney disease Brother   . Other Mother     Sepsis and perforated colon  . Other Father     Motor vehicle accident    Review of Systems: The review of systems is per the HPI.  All other systems were reviewed and are negative.  Physical Exam: BP 164/84  Pulse 82  Ht 5\' 7"  (1.702 m)  Wt 207 lb (93.895 kg)  BMI 32.41 kg/m2  SpO2 96% Patient is very pleasant and in no acute distress. Looks chronically ill. Skin is warm and dry. Color is normal.  HEENT is unremarkable. Normocephalic/atraumatic. PERRL. Sclera are nonicteric. Neck is supple. No masses. No JVD. Lungs are clear. Cardiac exam shows a regular rate and rhythm. Abdomen is soft. Extremities are full but with without significant edema. Gait and ROM are intact. Using a cane. No gross neurologic deficits noted.  LABORATORY DATA: N/A  Lab Results  Component Value Date   WBC 6.2 03/27/2013   HGB 12.6 03/27/2013   HCT 37.0 03/27/2013   PLT 219.0 03/27/2013   GLUCOSE 91 04/12/2013   ALT 15 06/02/2012   AST 18 06/02/2012   NA 132* 04/12/2013   K 3.1* 04/12/2013   CL 93* 04/12/2013   CREATININE 3.2* 04/12/2013   BUN 42* 04/12/2013   CO2 26 04/12/2013   TSH 4.93 07/09/2012   INR 2.11* 06/06/2012   Echo Study Conclusions  - Left ventricle: The cavity size was normal. There was mild focal basal hypertrophy of the septum. Systolic function was normal. The estimated ejection fraction was in the range of 55% to 60%. Wall motion was normal; there were no regional wall motion abnormalities. - Mitral valve: Mild regurgitation. - Left atrium: The atrium was moderately to severely dilated. - Right ventricle: The cavity size was mildly to moderately dilated. Systolic function was reduced. - Right atrium: The atrium was moderately dilated.   Assessment / Plan: 1. CAD - past PCIs - trying to manage medically and avoid repeat cath due to CKD - chest pain has improved with nitrate  therapy. Would continue with her current regimen for now.   2. Swelling - has improved with her medicine changes. Not using support stockings or ACE wraps - would still encourage to use if possible.   3. Chronic diastolic HF - remains short of breath. Echo looked unchanged. Most likely her dyspnea is multifactorial.   4. HTN - BP looks good at home. No change in her current regimen. She will continue to monitor at home.   5. CKD - followed by renal. May be looking at shunt placement.   I have cancelled her BMET for here today since she has had labs with Renal. No change in her medicines. See her back as planned next month.    Patient is agreeable to this plan and will call if any problems develop in the interim.   Rosalio Macadamia, RN, ANP-C  [facility]  [address]

## 2013-04-26 ENCOUNTER — Ambulatory Visit: Payer: Medicare Other | Admitting: Cardiovascular Disease

## 2013-04-30 ENCOUNTER — Encounter: Payer: Self-pay | Admitting: Internal Medicine

## 2013-05-16 ENCOUNTER — Other Ambulatory Visit: Payer: Self-pay | Admitting: *Deleted

## 2013-05-16 DIAGNOSIS — N184 Chronic kidney disease, stage 4 (severe): Secondary | ICD-10-CM

## 2013-05-16 DIAGNOSIS — Z01810 Encounter for preprocedural cardiovascular examination: Secondary | ICD-10-CM

## 2013-05-17 ENCOUNTER — Encounter: Payer: Self-pay | Admitting: Cardiovascular Disease

## 2013-05-17 ENCOUNTER — Ambulatory Visit (INDEPENDENT_AMBULATORY_CARE_PROVIDER_SITE_OTHER): Payer: Medicare Other | Admitting: Cardiovascular Disease

## 2013-05-17 VITALS — BP 144/78 | HR 76 | Ht 67.0 in | Wt 206.8 lb

## 2013-05-17 DIAGNOSIS — I10 Essential (primary) hypertension: Secondary | ICD-10-CM

## 2013-05-17 DIAGNOSIS — I509 Heart failure, unspecified: Secondary | ICD-10-CM

## 2013-05-17 DIAGNOSIS — I4891 Unspecified atrial fibrillation: Secondary | ICD-10-CM

## 2013-05-17 DIAGNOSIS — I2510 Atherosclerotic heart disease of native coronary artery without angina pectoris: Secondary | ICD-10-CM

## 2013-05-17 NOTE — Patient Instructions (Signed)
Your physician wants you to follow-up in:   3 MONTHS WITH  DR NISHAN  You will receive a reminder letter in the mail two months in advance. If you don't receive a letter, please call our office to schedule the follow-up appointment. Your physician recommends that you continue on your current medications as directed. Please refer to the Current Medication list given to you today.  

## 2013-05-17 NOTE — Assessment & Plan Note (Signed)
Good rate control and anticoagulation Back up pacing

## 2013-05-17 NOTE — Progress Notes (Signed)
Patient ID: [name], female   DOB: December 08, 1934, 78 y.o.   MRN: [id number] [name] is seen back today for  symptomatic bradycardia and has a PPM in place, CAD with past DES to the OM from 2006 and rotational atherotomy/DES to the proximal LAD in2009 - trying to now anage medically, chronic diastolic HF, HTN, GERD, PVD, hyperthyroidism, OA, gout, valvular heart disease with mild to moderate MR, stage III CKD, and AF with past AV node ablation.  Seen a month ago with swelling and chest pain. Trying to avoid cardiac cath because of her kidneys and manage her chest pain medically. She has had her medicines titrated and seemed to be better at her last visit.   Cr is 3  Has appt with Dr Myra Gianotti for shunt.  Home BP readings are good Not getting a lot of angina and not needing SL nitro  Imdur increased to 60 mg last visit      ROS: Denies fever, malais, weight loss, blurry vision, decreased visual acuity, cough, sputum, SOB, hemoptysis, pleuritic pain, palpitaitons, heartburn, abdominal pain, melena, lower extremity edema, claudication, or rash.  All other systems reviewed and negative  General: Affect appropriate Healthy:  appears stated age HEENT: normal Neck supple with no adenopathy JVP normal no bruits no thyromegaly Lungs clear with no wheezing and good diaphragmatic motion Heart:  S1/S2 no murmur, no rub, gallop or click PMI normal Abdomen: benighn, BS positve, no tenderness, no AAA no bruit.  No HSM or HJR Distal pulses intact with no bruits No edema Neuro non-focal Skin warm and dry No muscular weakness   Current Outpatient Prescriptions  Medication Sig Dispense Refill  . ALPRAZolam (XANAX) 0.5 MG tablet Take 0.5 mg by mouth 2 (two) times daily.       Marland Kitchen amLODipine (NORVASC) 5 MG tablet Take 0.5 tablets (2.5 mg total) by mouth daily.      Marland Kitchen aspirin EC 81 MG tablet Take 81 mg by mouth daily. Does not take daily only when she feels like taking it      . ferrous sulfate 325  (65 FE) MG tablet Take 325 mg by mouth daily with breakfast.       . furosemide (LASIX) 40 MG tablet Take 1 tablet (40mg ) by mouth in AM  90 tablet  3  . hydrALAZINE (APRESOLINE) 25 MG tablet Take 1 tablet (25 mg total) by mouth 3 (three) times daily.  90 tablet  3  . isosorbide mononitrate (IMDUR) 60 MG 24 hr tablet Take 1 tablet (60 mg total) by mouth daily.  30 tablet  5  . lovastatin (MEVACOR) 10 MG tablet Take 10 mg by mouth at bedtime.        . magnesium oxide (MAG-OX) 400 MG tablet Take 400 mg by mouth at bedtime.       . metoprolol (LOPRESSOR) 100 MG tablet Take 50 mg by mouth 3 (three) times daily.       Marland Kitchen oxyCODONE-acetaminophen (PERCOCET) 5-325 MG per tablet Take 0.5-1 tablets by mouth 3 (three) times daily as needed. For pain      . pantoprazole (PROTONIX) 40 MG tablet Take 40 mg by mouth 2 (two) times daily.       . paricalcitol (ZEMPLAR) 1 MCG capsule Take 1 mcg by mouth at bedtime.      . potassium chloride (K-DUR,KLOR-CON) 10 MEQ tablet Take 1 tablet (10 mEq total) by mouth daily.  30 tablet  6  . spironolactone (ALDACTONE) 25 MG tablet Take 12.5  mg by mouth daily.      Marland Kitchen warfarin (COUMADIN) 2 MG tablet Take 2-3 mg by mouth at bedtime. Pt takes 1 & 1/2 tabs on monday, Friday, and 1 tab all other days      . [DISCONTINUED] FLUoxetine (PROZAC) 20 MG capsule Take 20 mg by mouth daily.         No current facility-administered medications for this visit.    Allergies  Amiodarone and Propylthiouracil  Electrocardiogram:  afib v pacing rate 73  Assessment and Plan

## 2013-05-17 NOTE — Assessment & Plan Note (Signed)
Well controlled.  Continue current medications and low sodium Dash type diet.    

## 2013-05-17 NOTE — Assessment & Plan Note (Signed)
Stable with no angina and good activity level.  Continue medical Rx Trying to avoid cath given CRF  Will be easier to consder once shunt placed

## 2013-05-17 NOTE — Assessment & Plan Note (Signed)
Improved Volume issues related to CRF and inability to excrete F/U [place]

## 2013-05-28 ENCOUNTER — Telehealth: Payer: Self-pay | Admitting: Cardiovascular Disease

## 2013-05-28 NOTE — Telephone Encounter (Signed)
Pt called because she said she has been having problems with  stomach pain and c/o of nauseas this has been going on for a long time and is getting worse. Pt was made aware that she needs to call her PCP to see what is going on with her stomach. Pt verbalized understanding.

## 2013-05-28 NOTE — Telephone Encounter (Signed)
New problem   Pt states that she is having problems with her heart and she wants to know if that is the reason why she is constantly sick on her stomach... please advise.

## 2013-06-07 ENCOUNTER — Encounter: Payer: Self-pay | Admitting: Surgery

## 2013-06-10 ENCOUNTER — Encounter: Payer: Self-pay | Admitting: Surgery

## 2013-06-10 ENCOUNTER — Ambulatory Visit (HOSPITAL_COMMUNITY)
Admission: RE | Admit: 2013-06-10 | Discharge: 2013-06-10 | Disposition: A | Discharge status: Home / Self Care | Payer: Medicare Other | Source: Ambulatory Visit | Attending: Surgery | Admitting: Surgery

## 2013-06-10 ENCOUNTER — Other Ambulatory Visit: Payer: Self-pay | Admitting: *Deleted

## 2013-06-10 ENCOUNTER — Ambulatory Visit (INDEPENDENT_AMBULATORY_CARE_PROVIDER_SITE_OTHER): Payer: Medicare Other | Admitting: Surgery

## 2013-06-10 ENCOUNTER — Ambulatory Visit (INDEPENDENT_AMBULATORY_CARE_PROVIDER_SITE_OTHER)
Admission: RE | Admit: 2013-06-10 | Discharge: 2013-06-10 | Disposition: A | Discharge status: Home / Self Care | Payer: Medicare Other | Source: Ambulatory Visit | Attending: Surgery | Admitting: Surgery

## 2013-06-10 ENCOUNTER — Encounter: Payer: Self-pay | Admitting: *Deleted

## 2013-06-10 VITALS — BP 130/73 | HR 68 | Ht 67.0 in | Wt 204.5 lb

## 2013-06-10 DIAGNOSIS — N186 End stage renal disease: Secondary | ICD-10-CM | POA: Insufficient documentation

## 2013-06-10 DIAGNOSIS — N184 Chronic kidney disease, stage 4 (severe): Secondary | ICD-10-CM

## 2013-06-10 DIAGNOSIS — Z01810 Encounter for preprocedural cardiovascular examination: Secondary | ICD-10-CM

## 2013-06-10 NOTE — Progress Notes (Signed)
[facility]   Patient name: [name] Severe MRN: [id number] DOB: 12/15/1934 Sex: female   Referred by: Dr. Coladonato  Reason for referral:  Chief Complaint  Patient presents with  . New Evaluation    eval for AVF placement - Dr. Coladonato    HISTORY OF PRESENT ILLNESS: The patient comes in today to discuss dialysis access.  She is right-handed.  She is not yet on dialysis.  She was last seen in 2009 by Dr. Hayes who created a right brachiocephalic fistula which did not mature.  The patient suffers from symptomatic bradycardia.  She has a pacemaker in place.  She has undergone multiple coronary interventions.  She has intermittent chest pain.  She is on Coumadin  Past Medical History  Diagnosis Date  . Cardiac pacemaker in situ   . Cellulitis and abscess of unspecified site     a. h/o MSRA cellulitis of abdominal wall.  . CAD (coronary artery disease)     a. DES to OM 01/2005. b. Rotational atherotomy/DES to prox LAD 02/2008.  . Edema   . Chronic renal insufficiency     Cr ~2.3.  . Hypertension   . GERD (gastroesophageal reflux disease)   . Depression   . Chronic diastolic CHF (congestive heart failure)     a. EF 55-60% 11/2011, dry weight 200.  . Warfarin anticoagulation     a. Followed by PCP.  . GI bleed     a. EGD 01/2007: antral ulcers related to NSAID/aspirin use.  . SSS (sick sinus syndrome)     a. H/o AV node ablation 2008 secondary to difficult to control afib/flutter, with subsequent pacer.  . Atrial fibrillation with rapid ventricular response     a. H/o amiodarone thyroiditis. b. s/p AV node ablation 2008 with implantation of PPM 01/2007, upgraded to CRT-P 06/2007.  . Polycystic kidney disease     a. Per remote E-Chart records.  . Peripheral vascular disease   . Hyperthyroidism     a. Hx of intolerance to PTU.  . Anemia, unspecified   . Blood transfusion   . H/O hiatal hernia   . Umbilical hernia     unrepaired (11/24/11)  .  Migraines 11/24/11    "I've always had them"  . Arthritis   . Gout     "get it in my feet"  . Valvular heart disease     Echo 11/2011: mild-mod MR.  . COPD (chronic obstructive pulmonary disease)     Past Surgical History  Procedure Laterality Date  . Esophagogastroduodenoscopy    . Tonsillectomy  ~ 1960  . Coronary angioplasty      percutanous transluminal   . Cardiac catheterization  2006; 2008; 2009; 01/13/2011  . Back surgery    . Laminectomy and microdiscectomy lumbar spine  08/2003  . Cardioversion  10/2004; 02/2007    /E-chart  . Cholecystectomy    . Appendectomy  1954  . Vaginal hysterectomy  ?1981  . Av node ablation  02/2007    /E-chart  . Insert / replace / remove pacemaker  01/2007    initial placement  . Insert / replace / remove pacemaker  06/2007    upgraded/E-chart  . Av fistula placement, brachiocephalic  09/2007    right; "didn't work; my veins were too small"  . Joint replacement    . Total knee arthroplasty  01/2009    left  . Bladder and bowel  1970's    "tacked; damaged my intestines & had   to remove some; done @ [place] [facility]"    History   Social History  . Marital Status: Widowed    Spouse Name: N/A    Number of Children: N/A  . Years of Education: N/A   Occupational History  . Not on file.   Social History Main Topics  . Smoking status: Former Smoker -- 1.00 packs/day for 50 years    Types: Cigarettes    Quit date: 09/15/2004  . Smokeless tobacco: Never Used  . Alcohol Use: No  . Drug Use: No  . Sexual Activity: No   Other Topics Concern  . Not on file   Social History Narrative  . No narrative on file    Family History  Problem Relation Age of Onset  . Colon cancer Neg Hx   . Thyroid disease Neg Hx   . Stroke Brother   . Hypertension Brother   . Kidney disease Brother   . Other Mother     Sepsis and perforated colon  . Other Father     Motor vehicle accident    Allergies as of 06/10/2013 - Review Complete 06/10/2013   Allergen Reaction Noted  . Amiodarone Hives   . Propylthiouracil Rash 11/23/2011    Current Outpatient Prescriptions on File Prior to Visit  Medication Sig Dispense Refill  . ALPRAZolam (XANAX) 0.5 MG tablet Take 0.5 mg by mouth 2 (two) times daily.       . amLODipine (NORVASC) 5 MG tablet Take 0.5 tablets (2.5 mg total) by mouth daily.      . aspirin EC 81 MG tablet Take 81 mg by mouth daily. Does not take daily only when she feels like taking it      . ferrous sulfate 325 (65 FE) MG tablet Take 325 mg by mouth daily with breakfast.       . furosemide (LASIX) 40 MG tablet Take 1 tablet (40mg) by mouth in AM  90 tablet  3  . hydrALAZINE (APRESOLINE) 25 MG tablet Take 1 tablet (25 mg total) by mouth 3 (three) times daily.  90 tablet  3  . isosorbide mononitrate (IMDUR) 60 MG 24 hr tablet Take 1 tablet (60 mg total) by mouth daily.  30 tablet  5  . lovastatin (MEVACOR) 10 MG tablet Take 10 mg by mouth at bedtime.        . magnesium oxide (MAG-OX) 400 MG tablet Take 400 mg by mouth at bedtime.       . metoprolol (LOPRESSOR) 100 MG tablet Take 50 mg by mouth 3 (three) times daily.       . oxyCODONE-acetaminophen (PERCOCET) 5-325 MG per tablet Take 0.5-1 tablets by mouth 3 (three) times daily as needed. For pain      . pantoprazole (PROTONIX) 40 MG tablet Take 40 mg by mouth 2 (two) times daily.       . paricalcitol (ZEMPLAR) 1 MCG capsule Take 1 mcg by mouth at bedtime.      . potassium chloride (K-DUR,KLOR-CON) 10 MEQ tablet Take 1 tablet (10 mEq total) by mouth daily.  30 tablet  6  . spironolactone (ALDACTONE) 25 MG tablet Take 12.5 mg by mouth daily.      . warfarin (COUMADIN) 2 MG tablet Take 2-3 mg by mouth at bedtime. Pt takes 1 & 1/2 tabs on monday, Friday, and 1 tab all other days      . [DISCONTINUED] FLUoxetine (PROZAC) 20 MG capsule Take 20 mg by mouth daily.           No current facility-administered medications on file prior to visit.     REVIEW OF SYSTEMS: Cardiovascular:  Positive for chest pain, shortness of breath with exertion, pain in her legs and walking, leg swelling Pulmonary: No productive cough, asthma or wheezing. Neurologic: No weakness, paresthesias, aphasia, or amaurosis. No dizziness. Hematologic: No bleeding problems or clotting disorders. Musculoskeletal: No joint pain or joint swelling. Gastrointestinal: No blood in stool or hematemesis Genitourinary: No dysuria or hematuria. Psychiatric:: No history of major depression. Integumentary: No rashes or ulcers. Constitutional: No fever or chills.  PHYSICAL EXAMINATION: General: The patient appears their stated age.  Vital signs are BP 130/73  Pulse 68  Ht 5' 7" (1.702 m)  Wt 204 lb 8 oz (92.761 kg)  BMI 32.02 kg/m2  SpO2 100% HEENT:  No gross abnormalities Pulmonary: Respirations are non-labored Musculoskeletal: There are no major deformities.   Neurologic: No focal weakness or paresthesias are detected, Skin: There are no ulcer or rashes noted. Psychiatric: The patient has normal affect. Cardiovascular: There is a regular rate and rhythm without significant murmur appreciated.  Palpable right brachial and radial pulse.  Transverse antecubital incision.  No thrill appreciated in the upper arm  Diagnostic Studies: Vein mapping was ordered and reviewed.  The cephalic vein is very small the upper arm.  The basilic vein has diameters ranging from 0.80 down to 0.17 and the proximal forearm.  In the antecubital crease it measures 0.24 with wall thickening   Assessment:  Chronic renal insufficiency, stage IV Plan: The patient has previously undergone a attempt at brachiocephalic fistula placement.  This was done by Dr. Hayes in 2009.  This did not mature.  The patient needs new access.  A graft was not an option at this time.  After reviewing her vein mapping I have elected to proceed with a first stage basilic vein transposition.  I described the operation to the patient and her son.  I will try  to make a arteriovenous anastomosis below the antecubital crease.  I will need to evaluate the thickwalled vein at the antecubital crease.  I am doing this as a staged approach, because the patient with chest pain is not a good candidate for general anesthesia at this time.  She will be sent to Dr. Nishan for cardiac clearance as well as recommendations on Coumadin bridging prior to her operation.     V. Wells Tapanga Ottaway IV, M.D. [facility] Office: [phone] Pager:  [phone]   

## 2013-06-11 ENCOUNTER — Encounter: Payer: Self-pay | Admitting: Nephrology

## 2013-06-11 ENCOUNTER — Telehealth: Payer: Self-pay | Admitting: *Deleted

## 2013-06-11 NOTE — Telephone Encounter (Signed)
Message copied by Melene Plan on Tue Jun 11, 2013  9:18 AM ------      Message from: Wendall Stade      Created: Mon Jun 10, 2013  6:11 PM       Ok to stop coumadin and do surgery with general anesthesia            ----- Message -----         From: Melene Plan, RN         Sent: 06/10/2013   3:11 PM           To: Wendall Stade, MD, Jacqlyn Krauss, RN            Dr Myra Gianotti needs to do a renal access on this pt. He needs cardiac clearance for gen anesthesia. He,also, needs to stop her Coumadin 5 days prior to the surgery 06/21/13. Does she need to bridge with Lovanox?      Thanks      Jacquelyne Balint      [facility] Clinical Team Leader       ------

## 2013-06-12 ENCOUNTER — Encounter (HOSPITAL_COMMUNITY): Payer: Self-pay | Admitting: Pharmacy Technician

## 2013-06-18 ENCOUNTER — Encounter (HOSPITAL_COMMUNITY)
Admission: RE | Admit: 2013-06-18 | Discharge: 2013-06-18 | Disposition: A | Discharge status: Home / Self Care | Payer: Medicare Other | Source: Ambulatory Visit | Attending: Surgery | Admitting: Surgery

## 2013-06-18 ENCOUNTER — Encounter (HOSPITAL_COMMUNITY)
Admission: RE | Admit: 2013-06-18 | Discharge: 2013-06-18 | Disposition: A | Discharge status: Home / Self Care | Payer: Medicare Other | Source: Ambulatory Visit | Attending: Anesthesiology | Admitting: Anesthesiology

## 2013-06-18 ENCOUNTER — Encounter (HOSPITAL_COMMUNITY): Payer: Self-pay

## 2013-06-18 HISTORY — DX: Unspecified hemorrhoids: K64.9

## 2013-06-18 HISTORY — DX: Presence of cardiac pacemaker: Z95.0

## 2013-06-18 HISTORY — DX: Insomnia, unspecified: G47.00

## 2013-06-18 HISTORY — DX: Personal history of other diseases of the musculoskeletal system and connective tissue: Z87.39

## 2013-06-18 HISTORY — DX: Hyperlipidemia, unspecified: E78.5

## 2013-06-18 NOTE — Progress Notes (Addendum)
Cardiologist is Dr.Nishan with last office visit in epic  Dr.Gregg Ladona Ridgel takes care of pacemaker  Stress test done > 94yrs ago  Multiple echo reports in epic  Multiple heart cath reports in epic  Clearance in chart and OV in epic  EKG in epic from 01-15-13  Denies CXR within past yr  Medical Md is Dr.James Little

## 2013-06-18 NOTE — Progress Notes (Signed)
Pt states she has chronic chest pain;last took a Nitroglycerin a week ago and only took one

## 2013-06-18 NOTE — Pre-Procedure Instructions (Signed)
[name]  06/18/2013   Your procedure is scheduled on:  Fri, Nov 7 @ 9:45 AM  Report to [facility] Short Stay Entrance A  at 7:30 AM.  Call this number if you have problems the morning of surgery: [phone]   Remember:   Do not eat food or drink liquids after midnight.   Take these medicines the morning of surgery with A SIP OF WATER: Alprazolam(Xanax),Amlodipine(Norvasc),Hydralazine(Apresoline),Isosorbide(Imdur),Metoprolol(Lopressor),Pain Pill(if needed),and Pantoprazole(Protonix)               Hold Coumadin.No Goody's,BC's,Aleve,Ibuprofen,Fish Oil,or any Herbal Medications   Do not wear jewelry, make-up or nail polish.  Do not wear lotions, powders, or perfumes. You may wear deodorant.  Do not shave 48 hours prior to surgery.   Do not bring valuables to the hospital.  [facility] is not responsible                  for any belongings or valuables.               Contacts, dentures or bridgework may not be worn into surgery.  Leave suitcase in the car. After surgery it may be brought to your room.  For patients admitted to the hospital, discharge time is determined by your                treatment team.               Patients discharged the day of surgery will not be allowed to drive  home.    Special Instructions: Shower using CHG 2 nights before surgery and the night before surgery.  If you shower the day of surgery use CHG.  Use special wash - you have one bottle of CHG for all showers.  You should use approximately 1/3 of the bottle for each shower.   Please read over the following fact sheets that you were given: Pain Booklet, Coughing and Deep Breathing and Surgical Site Infection Prevention

## 2013-06-19 NOTE — Progress Notes (Signed)
Anesthesia Chart Review:  Patient is a 78 year old female scheduled for right first stage BVT on 06/21/13 by Dr. Myra Gianotti.  She is not yet on hemodialysis.  She had a previous right brachiocephalic AVF in 2009 that never matured.    History includes former smoker, CAD/MI s/p DES OM '06 and atherotomy/DES to proximal LAD '09, valvular heart disease with mild to moderate MR, chronic diastolic CHF, afib s/p AV node ablation, SSS s/p [employer] PPM, THN, polycystic kidney disease with CKD, PAD, hyperthyroidism with history of intolerance to PTU (normal thyroid uptake scan '09), hiatal hernia, GERD, anemia, migraines, depression, obesity.  She reports chronic intermittent chest pains; however, cardiology is trying to hold off on cardiac catheterization until dialysis access is established.  PCP is Dr. [name].  Primary cardiologist is Dr. [name] who gave permission to hold Coumadin for surgery.  EP cardiologist is Dr. Ladona Ridgel. Pacer device form indicates that she is pacer dependent.  Echo on 03/27/13 showed: - Left ventricle: The cavity size was normal. There was mild focal basal hypertrophy of the septum. Systolic function was normal. The estimated ejection fraction was in the range of 55% to 60%. Wall motion was normal; there were no regional wall motion abnormalities. - Mitral valve: Mild regurgitation. - Left atrium: The atrium was moderately to severely dilated. - Right ventricle: The cavity size was mildly to moderately dilated. Systolic function was reduced. - Right atrium: The atrium was moderately dilated.  Cardiac cath on 01/12/11 showed: 1. No evidence of obstructive coronary artery disease.  2. Patent stents in the left anterior descending artery as well as OM-1. There is moderate mid RCA stenosis, which does not seem to be significantly different from most recent cardiac catheterization.  3. Moderate hypertension and elevated left ventricular end-diastolic pressure. (See report in Epic for  full details.)  EKG on 01/15/13 showed v-paced rhythm with occasional PVC.  CXR report on 06/18/13 showed no active cardiopulmonary disease.  She is for labs on the day of surgery.  If labs are reasonable and she is not having acute CV/CHF symptoms then I would anticipate that she could proceed as planned.  Velna Ochs [facility]/Anesthesiology Phone [phone] 06/19/2013 10:22 AM

## 2013-06-19 NOTE — Progress Notes (Signed)
[name] with [employer] notified of pacemaker programming during procedure and what time patient surgery is scheduled for.

## 2013-06-20 MED ORDER — DEXTROSE 5 % IV SOLN
1.5000 g | INTRAVENOUS | Status: AC
  Administered 2013-06-21: 1.5 g via INTRAVENOUS
  Filled 2013-06-20: qty 1.5

## 2013-06-21 ENCOUNTER — Ambulatory Visit (HOSPITAL_COMMUNITY): Payer: Medicare Other | Admitting: Anesthesiology

## 2013-06-21 ENCOUNTER — Encounter (HOSPITAL_COMMUNITY): Payer: Self-pay | Admitting: *Deleted

## 2013-06-21 ENCOUNTER — Other Ambulatory Visit: Payer: Self-pay | Admitting: *Deleted

## 2013-06-21 ENCOUNTER — Encounter (HOSPITAL_COMMUNITY)
Admission: RE | Disposition: A | Discharge status: Home / Self Care | Payer: Self-pay | Source: Ambulatory Visit | Attending: Surgery

## 2013-06-21 ENCOUNTER — Telehealth: Payer: Self-pay | Admitting: Surgery

## 2013-06-21 ENCOUNTER — Ambulatory Visit (HOSPITAL_COMMUNITY)
Admission: RE | Admit: 2013-06-21 | Discharge: 2013-06-21 | Disposition: A | Discharge status: Home / Self Care | Payer: Medicare Other | Source: Ambulatory Visit | Attending: Surgery | Admitting: Surgery

## 2013-06-21 ENCOUNTER — Encounter (HOSPITAL_COMMUNITY): Payer: Medicare Other | Admitting: Vascular Surgery

## 2013-06-21 DIAGNOSIS — I739 Peripheral vascular disease, unspecified: Secondary | ICD-10-CM | POA: Insufficient documentation

## 2013-06-21 DIAGNOSIS — E0590 Thyrotoxicosis, unspecified without thyrotoxic crisis or storm: Secondary | ICD-10-CM | POA: Insufficient documentation

## 2013-06-21 DIAGNOSIS — I5032 Chronic diastolic (congestive) heart failure: Secondary | ICD-10-CM | POA: Insufficient documentation

## 2013-06-21 DIAGNOSIS — I509 Heart failure, unspecified: Secondary | ICD-10-CM | POA: Insufficient documentation

## 2013-06-21 DIAGNOSIS — N186 End stage renal disease: Secondary | ICD-10-CM

## 2013-06-21 DIAGNOSIS — Z950 Presence of cardiac pacemaker: Secondary | ICD-10-CM | POA: Insufficient documentation

## 2013-06-21 DIAGNOSIS — K219 Gastro-esophageal reflux disease without esophagitis: Secondary | ICD-10-CM | POA: Insufficient documentation

## 2013-06-21 DIAGNOSIS — J4489 Other specified chronic obstructive pulmonary disease: Secondary | ICD-10-CM | POA: Insufficient documentation

## 2013-06-21 DIAGNOSIS — J449 Chronic obstructive pulmonary disease, unspecified: Secondary | ICD-10-CM | POA: Insufficient documentation

## 2013-06-21 DIAGNOSIS — N184 Chronic kidney disease, stage 4 (severe): Secondary | ICD-10-CM

## 2013-06-21 DIAGNOSIS — Z4931 Encounter for adequacy testing for hemodialysis: Secondary | ICD-10-CM

## 2013-06-21 DIAGNOSIS — N189 Chronic kidney disease, unspecified: Secondary | ICD-10-CM | POA: Insufficient documentation

## 2013-06-21 DIAGNOSIS — I209 Angina pectoris, unspecified: Secondary | ICD-10-CM | POA: Insufficient documentation

## 2013-06-21 DIAGNOSIS — I2510 Atherosclerotic heart disease of native coronary artery without angina pectoris: Secondary | ICD-10-CM | POA: Insufficient documentation

## 2013-06-21 DIAGNOSIS — I129 Hypertensive chronic kidney disease with stage 1 through stage 4 chronic kidney disease, or unspecified chronic kidney disease: Secondary | ICD-10-CM | POA: Insufficient documentation

## 2013-06-21 HISTORY — PX: BASCILIC VEIN TRANSPOSITION: SHX5742

## 2013-06-21 LAB — POCT I-STAT 4, (NA,K, GLUC, HGB,HCT)
HCT: 38 % (ref 36.0–46.0)
Hemoglobin: 12.9 g/dL (ref 12.0–15.0)
Potassium: 3.6 mEq/L (ref 3.5–5.1)
Sodium: 140 mEq/L (ref 135–145)

## 2013-06-21 LAB — PROTIME-INR
INR: 1.12 (ref 0.00–1.49)
Prothrombin Time: 14.2 seconds (ref 11.6–15.2)

## 2013-06-21 SURGERY — TRANSPOSITION, VEIN, BASILIC
Anesthesia: General | Site: Arm Upper | Laterality: Right | Wound class: Clean

## 2013-06-21 MED ORDER — FENTANYL CITRATE 0.05 MG/ML IJ SOLN
INTRAMUSCULAR | Status: DC | PRN
  Administered 2013-06-21: 50 ug via INTRAVENOUS
  Administered 2013-06-21 (×2): 25 ug via INTRAVENOUS

## 2013-06-21 MED ORDER — SODIUM CHLORIDE 0.9 % IR SOLN
Status: DC | PRN
  Administered 2013-06-21: 11:00:00

## 2013-06-21 MED ORDER — MIDAZOLAM HCL 5 MG/5ML IJ SOLN
INTRAMUSCULAR | Status: DC | PRN
  Administered 2013-06-21 (×2): 0.5 mg via INTRAVENOUS

## 2013-06-21 MED ORDER — OXYCODONE-ACETAMINOPHEN 5-325 MG PO TABS: 1.0000 | ORAL_TABLET | Freq: Three times a day (TID) | ORAL | Status: DC | PRN

## 2013-06-21 MED ORDER — LIDOCAINE-EPINEPHRINE (PF) 1 %-1:200000 IJ SOLN
INTRAMUSCULAR | Status: AC
  Filled 2013-06-21: qty 10

## 2013-06-21 MED ORDER — 0.9 % SODIUM CHLORIDE (POUR BTL) OPTIME
TOPICAL | Status: DC | PRN
  Administered 2013-06-21: 1000 mL

## 2013-06-21 MED ORDER — FENTANYL CITRATE 0.05 MG/ML IJ SOLN: 25.0000 ug | INTRAMUSCULAR | Status: DC | PRN

## 2013-06-21 MED ORDER — ONDANSETRON HCL 4 MG/2ML IJ SOLN
INTRAMUSCULAR | Status: DC | PRN
  Administered 2013-06-21: 4 mg via INTRAVENOUS

## 2013-06-21 MED ORDER — LIDOCAINE-EPINEPHRINE (PF) 1 %-1:200000 IJ SOLN
INTRAMUSCULAR | Status: DC | PRN
  Administered 2013-06-21: 6 mL

## 2013-06-21 MED ORDER — SODIUM CHLORIDE 0.9 % IV SOLN
INTRAVENOUS | Status: DC
  Administered 2013-06-21: 08:00:00 via INTRAVENOUS

## 2013-06-21 MED ORDER — PROPOFOL INFUSION 10 MG/ML OPTIME
INTRAVENOUS | Status: DC | PRN
  Administered 2013-06-21: 25 ug/kg/min via INTRAVENOUS

## 2013-06-21 MED ORDER — DEXTROSE 5 % IV SOLN
INTRAVENOUS | Status: DC | PRN
  Administered 2013-06-21: 11:00:00 via INTRAVENOUS

## 2013-06-21 MED ORDER — PROTAMINE SULFATE 10 MG/ML IV SOLN
INTRAVENOUS | Status: DC | PRN
  Administered 2013-06-21: 25 mg via INTRAVENOUS

## 2013-06-21 MED ORDER — HEPARIN SODIUM (PORCINE) 1000 UNIT/ML IJ SOLN
INTRAMUSCULAR | Status: DC | PRN
  Administered 2013-06-21: 3000 [IU] via INTRAVENOUS

## 2013-06-21 SURGICAL SUPPLY — 37 items
CANISTER SUCTION 2500CC (MISCELLANEOUS) ×2 IMPLANT
CATH EMB 3FR 40CM (CATHETERS) ×2 IMPLANT
CLIP TI MEDIUM 24 (CLIP) ×2 IMPLANT
CLIP TI WIDE RED SMALL 24 (CLIP) ×2 IMPLANT
COVER PROBE W GEL 5X96 (DRAPES) ×2 IMPLANT
COVER SURGICAL LIGHT HANDLE (MISCELLANEOUS) ×2 IMPLANT
DERMABOND ADVANCED (GAUZE/BANDAGES/DRESSINGS) ×1
DERMABOND ADVANCED .7 DNX12 (GAUZE/BANDAGES/DRESSINGS) ×1 IMPLANT
ELECT REM PT RETURN 9FT ADLT (ELECTROSURGICAL) ×2
ELECTRODE REM PT RTRN 9FT ADLT (ELECTROSURGICAL) ×1 IMPLANT
GLOVE BIOGEL PI IND STRL 6.5 (GLOVE) ×1 IMPLANT
GLOVE BIOGEL PI IND STRL 7.0 (GLOVE) ×1 IMPLANT
GLOVE BIOGEL PI IND STRL 7.5 (GLOVE) ×1 IMPLANT
GLOVE BIOGEL PI INDICATOR 6.5 (GLOVE) ×1
GLOVE BIOGEL PI INDICATOR 7.0 (GLOVE) ×1
GLOVE BIOGEL PI INDICATOR 7.5 (GLOVE) ×1
GLOVE SS BIOGEL STRL SZ 6.5 (GLOVE) ×1 IMPLANT
GLOVE SUPERSENSE BIOGEL SZ 6.5 (GLOVE) ×1
GLOVE SURG SS PI 7.0 STRL IVOR (GLOVE) ×4 IMPLANT
GLOVE SURG SS PI 7.5 STRL IVOR (GLOVE) ×2 IMPLANT
GOWN PREVENTION PLUS XXLARGE (GOWN DISPOSABLE) ×4 IMPLANT
GOWN STRL NON-REIN LRG LVL3 (GOWN DISPOSABLE) ×6 IMPLANT
HEMOSTAT SNOW SURGICEL 2X4 (HEMOSTASIS) IMPLANT
KIT BASIN OR (CUSTOM PROCEDURE TRAY) ×2 IMPLANT
KIT ROOM TURNOVER OR (KITS) ×2 IMPLANT
NS IRRIG 1000ML POUR BTL (IV SOLUTION) ×2 IMPLANT
PACK CV ACCESS (CUSTOM PROCEDURE TRAY) ×2 IMPLANT
PAD ARMBOARD 7.5X6 YLW CONV (MISCELLANEOUS) ×4 IMPLANT
SUT PROLENE 6 0 CC (SUTURE) ×2 IMPLANT
SUT SILK 2 0 SH (SUTURE) IMPLANT
SUT VIC AB 3-0 SH 27 (SUTURE) ×1
SUT VIC AB 3-0 SH 27X BRD (SUTURE) ×1 IMPLANT
SUT VICRYL 4-0 PS2 18IN ABS (SUTURE) ×2 IMPLANT
TOWEL OR 17X24 6PK STRL BLUE (TOWEL DISPOSABLE) ×2 IMPLANT
TOWEL OR 17X26 10 PK STRL BLUE (TOWEL DISPOSABLE) ×2 IMPLANT
UNDERPAD 30X30 INCONTINENT (UNDERPADS AND DIAPERS) ×2 IMPLANT
WATER STERILE IRR 1000ML POUR (IV SOLUTION) ×2 IMPLANT

## 2013-06-21 NOTE — Telephone Encounter (Addendum)
Message copied by Fredrich Birks on Fri Jun 21, 2013  2:59 PM ------      Message from: Melene Plan      Created: Fri Jun 21, 2013  1:53 PM                   ----- Message -----         From: Nada Libman, MD         Sent: 06/21/2013  12:02 PM           To: Reuel Derby, Melene Plan, RN            06/21/2013:            Surgeon:  Jorge Ny      Assistants:  Della Goo      Procedure:   First stage right basilic vein transposition      Followup and 6 weeks with a duplex of her fistula             ------  06/21/13: spoke with pt regarding follow up, dpm

## 2013-06-21 NOTE — Transfer of Care (Signed)
Immediate Anesthesia Transfer of Care Note  Patient: [name]  Procedure(s) Performed: Procedure(s): BASCILIC VEIN TRANSPOSITION 1ST STAGE (Right)  Patient Location: PACU  Anesthesia Type:MAC  Level of Consciousness: awake, alert , oriented and patient cooperative  Airway & Oxygen Therapy: Patient Spontanous Breathing and Patient connected to nasal cannula oxygen  Post-op Assessment: Report given to PACU RN, Post -op Vital signs reviewed and stable and Patient moving all extremities  Post vital signs: Reviewed and stable  Complications: No apparent anesthesia complications
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
## 2013-06-21 NOTE — Interval H&P Note (Signed)
History and Physical Interval Note:  06/21/2013 10:13 AM  [name]  has presented today for surgery, with the diagnosis of ESRD  The various methods of treatment have been discussed with the patient and family. After consideration of risks, benefits and other options for treatment, the patient has consented to  Procedure(s): BASCILIC VEIN TRANSPOSITION 1ST STAGE (Right) as a surgical intervention .  The patient's history has been reviewed, patient examined, no change in status, stable for surgery.  I have reviewed the patient's chart and labs.  Questions were answered to the patient's satisfaction.     Ravon Mcilhenny IV, V. WELLS

## 2013-06-21 NOTE — Anesthesia Postprocedure Evaluation (Signed)
  Anesthesia Post-op Note  Patient: [name]  Procedure(s) Performed: Procedure(s): BASCILIC VEIN TRANSPOSITION 1ST STAGE (Right)  Patient Location: PACU  Anesthesia Type: MAC  Level of Consciousness: awake and alert   Airway and Oxygen Therapy: Patient Spontanous Breathing  Post-op Pain: none  Post-op Assessment: Post-op Vital signs reviewed, Patient's Cardiovascular Status Stable and Respiratory Function Stable  Post-op Vital Signs: Reviewed  Filed Vitals:   06/21/13 1245  BP: 136/63  Pulse: 60  Temp:   Resp: 17    Complications: No apparent anesthesia complications

## 2013-06-21 NOTE — Progress Notes (Signed)
Called and confirmed [employer] Rep, Leta Jungling, arriving to short stay today at 9:45 am prior to procedure.

## 2013-06-21 NOTE — Preoperative (Signed)
Beta Blockers   Reason not to administer Beta Blockers:Lopressor this morning 

## 2013-06-21 NOTE — Op Note (Signed)
Vascular and Vein Specialists of [facility]  Patient name: [name] MRN: [id number] DOB: 03-15-1935 Sex: female  06/21/2013 Pre-operative Diagnosis: Chronic renal insufficiency Post-operative diagnosis:  Same Surgeon:  Jorge Ny Assistants:  Della Goo Procedure:   First stage right basilic vein transposition Anesthesia:  MAC Blood Loss:  See anesthesia record Specimens:  None  Findings:  I made a longitudinal incision below the antecubital crease. I dissected out the radial, although comment brachial artery from this location. I also isolated the basilic vein. I identified a large implant and created about 2 venous anastomosis between the basilic vein branch at the radial artery  Indications:  The patient comes in for dialysis access. She is previously undergone a failed attempt at a 5 brachiocephalic fistula many years ago. She is in need of access. Vein mapping field a basilic vein which had thick walls to the antecubital crease. Therefore I elected to perform a stapes attempt at this position.  Procedure:  The patient was identified in the holding area and taken to [facility] OR ROOM 12  The patient was then placed supine on the table. MAC anesthesia was administered.  The patient was prepped and draped in the usual sterile fashion.  A time out was called and antibiotics were administered.  Ultrasound was used to map the basilic vein and the arm. It was marked with a. I found a spot below the antecubital crease for the vein and artery were in close proximity. 1% lidocaine was used for local anesthesia. A #10 blade was used to make a longitudinal incision. I initially encountered the basilic vein. It was mobilized off the length of the incision. There was a generous branch near the distal part of the incision. The vein was marked with a pad for proper orientation. Next, and this incision I isolated the artery. The fascia was opened sharply and I encountered the brachial artery at  its bifurcation. I individually isolated the interosseous, radial, and ulnar artery. 3000 units of heparin were then administered. The artery was occluded. I then made an arteriotomy in the radial artery which was extended longitudinally a Potts scissors. I then transected the branch of the basilic vein. I passed a #3 Fogarty catheter centrally. I identified the basilic vein again with ultrasound guidance all the Fogarty catheter passing through the basilic vein. I then performed a end-to-side anastomosis between the basilic vein branch and the radial artery with running 6-0 Prolene. Prior to placing, the appropriate less than was were performed. The anastomosis was completed. No excellent Doppler signals in the radial and ulnar artery at the wrist. The basilic vein had distended nicely and there was a good thrill up to the shoulder. 25 mg of protamine was then administered. The incision was closed with 2 layers of Vicryl. Dermabond was applied.   Disposition:  To PACU in stable condition.   Juleen China, M.D. [facility] of [place] Office: [phone] Pager:  [phone]

## 2013-06-21 NOTE — Anesthesia Preprocedure Evaluation (Addendum)
Anesthesia Evaluation  Patient identified by MRN, date of birth, ID band Patient awake    Reviewed: Allergy & Precautions, H&P , NPO status , Patient's Chart, lab work & pertinent test results, reviewed documented beta blocker date and time   Airway Mallampati: II TM Distance: >3 FB Neck ROM: Full    Dental no notable dental hx. (+) Edentulous Upper, Partial Lower and Dental Advisory Given   Pulmonary shortness of breath,  breath sounds clear to auscultation  Pulmonary exam normal       Cardiovascular hypertension, On Medications and On Home Beta Blockers + angina at rest + CAD, + Past MI, + Peripheral Vascular Disease and +CHF + dysrhythmias Atrial Fibrillation + pacemaker Rhythm:Irregular Rate:Normal     Neuro/Psych negative neurological ROS  negative psych ROS   GI/Hepatic Neg liver ROS, PUD, GERD-  Medicated and Controlled,  Endo/Other  Hyperthyroidism   Renal/GU Renal disease  negative genitourinary   Musculoskeletal   Abdominal   Peds  Hematology negative hematology ROS (+)   Anesthesia Other Findings   Reproductive/Obstetrics negative OB ROS                           Anesthesia Physical Anesthesia Plan  ASA: IV  Anesthesia Plan: MAC   Post-op Pain Management:    Induction: Intravenous  Airway Management Planned: Simple Face Mask  Additional Equipment:   Intra-op Plan:   Post-operative Plan:   Informed Consent: I have reviewed the patients History and Physical, chart, labs and discussed the procedure including the risks, benefits and alternatives for the proposed anesthesia with the patient or authorized representative who has indicated his/her understanding and acceptance.   Dental advisory given  Plan Discussed with: CRNA  Anesthesia Plan Comments:        Anesthesia Quick Evaluation

## 2013-06-21 NOTE — Progress Notes (Signed)
Spoke with Jana Half, and Dr. Sampson Goon.  Dr. Sampson Goon said it's okay to use a magnet during surgery but would be good to have pacemaker interrogated in PACU.  Leta Jungling said she will have someone in PACU  At 1300.

## 2013-06-23 ENCOUNTER — Telehealth: Payer: Self-pay | Admitting: Cardiology

## 2013-06-23 NOTE — Telephone Encounter (Signed)
Received page from answering service regarding this patient. Alcario Drought, RN with [employer] about this patient with "questions" and upon returning the call there was no answer. I left a message for Alcario Drought, Charity fundraiser with [employer].

## 2013-06-24 ENCOUNTER — Encounter (HOSPITAL_COMMUNITY): Payer: Self-pay | Admitting: Surgery

## 2013-06-25 ENCOUNTER — Telehealth: Payer: Self-pay | Admitting: Cardiovascular Disease

## 2013-06-25 NOTE — Telephone Encounter (Signed)
SPOKE WITH LINDA  WILL CALL PT  AND  DISCUSS SYMPTOMS .Zack Seal

## 2013-06-25 NOTE — Telephone Encounter (Signed)
New Problem:  Bonita Quin, case manager, states she is calling in reference to the pt's leg swelling and wt gain... Bonita Quin is calling b/c the pt has increased her Lasix from 40 mg to 80 mg..  Lindas states she is afraid that much lasix may hurt the pt's kidneys... Please advise

## 2013-06-25 NOTE — Telephone Encounter (Signed)
CALLED PT    IN RE TO  MESSAGE BELOW  PER PT  HAS  DOUBLED  LASIX TO 80 MG ENCOURAGED  PT  TO CALL KIDNEY  DOCTOR  AS  KIDNEY FUNCTIONS  ARE  VERY ELEVATED   MED CHANGE HAS  HELPED  WITH SWELLING   WHILE SPEAKING TO PT  HAS  COMPLAINED WITH  CHEST  PAIN  OFF AND  ON  HAS TAKEN NTG MORE  FREQUENTLY   AND  HAS  HAD TO TAKEN  UP  TO  2 TABS  WITH AN  EPISODE   AND PAIN OCCURS  AT  REST  INSTRUCTED  PT  IF  S/S  INCREASE OR HAS  TO TAKE   3  NTG WITH  NO RELIEF THEN NEEDS  TO GO  TO ER  FOR EVAL AND TX  APPT    MADE   FOR  07-01-13 AT  8:50  AM WITH  SCOTT WEAVER PAC .Zack Seal

## 2013-07-01 ENCOUNTER — Ambulatory Visit (INDEPENDENT_AMBULATORY_CARE_PROVIDER_SITE_OTHER): Payer: Medicare Other | Admitting: Physician Assistant

## 2013-07-01 ENCOUNTER — Encounter: Payer: Self-pay | Admitting: Physician Assistant

## 2013-07-01 VITALS — BP 118/68 | HR 66 | Ht 67.0 in | Wt 208.0 lb

## 2013-07-01 DIAGNOSIS — N184 Chronic kidney disease, stage 4 (severe): Secondary | ICD-10-CM

## 2013-07-01 DIAGNOSIS — I209 Angina pectoris, unspecified: Secondary | ICD-10-CM

## 2013-07-01 DIAGNOSIS — I2510 Atherosclerotic heart disease of native coronary artery without angina pectoris: Secondary | ICD-10-CM

## 2013-07-01 DIAGNOSIS — I10 Essential (primary) hypertension: Secondary | ICD-10-CM

## 2013-07-01 DIAGNOSIS — I5033 Acute on chronic diastolic (congestive) heart failure: Secondary | ICD-10-CM

## 2013-07-01 DIAGNOSIS — I4891 Unspecified atrial fibrillation: Secondary | ICD-10-CM

## 2013-07-01 DIAGNOSIS — I208 Other forms of angina pectoris: Secondary | ICD-10-CM

## 2013-07-01 LAB — BASIC METABOLIC PANEL
BUN: 35 mg/dL — ABNORMAL HIGH (ref 6–23)
Calcium: 9.7 mg/dL (ref 8.4–10.5)
Chloride: 97 mEq/L (ref 96–112)
Creatinine, Ser: 2.8 mg/dL — ABNORMAL HIGH (ref 0.4–1.2)
Glucose, Bld: 96 mg/dL (ref 70–99)
Potassium: 4.2 mEq/L (ref 3.5–5.1)
Sodium: 133 mEq/L — ABNORMAL LOW (ref 135–145)

## 2013-07-01 MED ORDER — ISOSORBIDE MONONITRATE ER 60 MG PO TB24: 120.0000 mg | ORAL_TABLET | Freq: Every day | ORAL | Status: DC

## 2013-07-01 MED ORDER — FUROSEMIDE 40 MG PO TABS: ORAL_TABLET | ORAL | Status: DC

## 2013-07-01 MED ORDER — METOPROLOL TARTRATE 25 MG PO TABS: 75.0000 mg | ORAL_TABLET | Freq: Three times a day (TID) | ORAL | Status: DC

## 2013-07-01 NOTE — Patient Instructions (Addendum)
INCREASE METOPROLOL TO 75 MG THREE TIMES DAILY  INCREASE IMDUR 120 MG DAILY  INCREASE LASIX 40 MG TWICE DAILY  LABS TODAY BMET  REPEAT  BMET 07/05/13 ANYTIME BETWEEN 7:30 AND 4:30  PLEASE FOLLOW UP WITH [place], [facility] 07/09/13 @ 2:20 PM

## 2013-07-01 NOTE — Progress Notes (Signed)
[address] [place], [place]  [address] Phone: [phone] Fax:  [phone]  Date:  07/01/2013   ID:  [name], DOB 03-Dec-1934, MRN [id number]  PCP:  Alexandria Puffer, MD  Cardiologist:  Dr. Charlton [name]   Electrophysiologist:  Dr. Lewayne [name]    History of Present Illness: [name] is a 78 y.o. female with a hx of CAD, s/p DES to the OM in 6/06, rotational atherectomy + DES to pLAD in 7/09, symptomatic bradycardia status post pacemaker with upgrade to CRT-P 11/08, atrial fibrillation with prior AV node ablation (prior amiodarone thyroiditis), chronic diastolic CHF, mitral regurgitation, chronic kidney disease stage IV, HTN, HL, GERD, prior upper GI bleed in 2008 related to NSAID use, PVD.    Myoview (01/2009): EF 79%, no scar or ischemia.  LHC (12/2010): Proximal LAD stent patent with 30% distal to stent, mid LAD 30%, ostial first septal branch 80%, OM1 stent patent (10-20% ISR), mid circumflex 20%, proximal RCA 30-40%, mid RCA 50-60%-medical therapy continued.  Echo (03/2013):  Mild focal basal septal hypertrophy, EF 55-60%, mild MR, moderate to severe LAE, mild to moderate RVE, reduced RVSF, moderate RAE.    She has been followed closely with chest pain. Medications have been adjusted. Dr. Eden [name] has been trying to avoid cardiac catheterization due to chronic renal failure. Once she has adequate access, this could certainly be considered.  Last seen by Dr. Eden [name] 05/17/13.  She presents to the office today with complaints of worsening chest pain and dyspnea with exertion over the last few weeks.  She has L sided and substernal chest pressure with minimal activity.  She sometimes has symptoms at rest.  She notes radiation to her neck and jaw.  She denies syncope.  She sleeps on 1 pillow.  She has occasional episodes of PND.  LE edema is worse.  She notes recent weight gain and she took extra Lasix (wt 10/16 was 199 => 11/10 was 210 => today 204 at home).  She is taking  NTG several times a day with relief of symptoms.   Recent Labs: 07/09/2012: TSH 4.93  03/27/2013: Pro B Natriuretic peptide (BNP) 288.0*  04/12/2013: Creatinine 3.2*  06/21/2013: Hemoglobin 12.9; Potassium 3.6   Wt Readings from Last 3 Encounters:  07/01/13 208 lb (94.348 kg)  06/18/13 205 lb (92.987 kg)  06/10/13 204 lb 8 oz (92.761 kg)     Past Medical History  Diagnosis Date  . Cardiac pacemaker in situ   . Cellulitis and abscess of unspecified site     a. h/o MSRA cellulitis of abdominal wall.  Marland Kitchen CAD (coronary artery disease)     a. DES to OM 01/2005. b. Rotational atherotomy/DES to prox LAD 02/2008.  . Edema   . Chronic renal insufficiency     Cr ~2.3.  . Warfarin anticoagulation     a. Followed by PCP.  Marland Kitchen GI bleed     a. EGD 01/2007: antral ulcers related to NSAID/aspirin use.  . Polycystic kidney disease     a. Per remote E-Chart records.  . Peripheral vascular disease   . Hyperthyroidism     a. Hx of intolerance to PTU.  Marland Kitchen Blood transfusion     no abnormal reaction  . H/O hiatal hernia   . Umbilical hernia     unrepaired (11/24/11)  . Arthritis   . Valvular heart disease     Echo 11/2011: mild-mod MR.  Marland Kitchen Hypertension     takes Amlodipine,Hydralazine,and Imdur daily  .  Hyperlipidemia     takes Lovastatin daily  . Anemia, unspecified     takes an Iron Pill daily  . SSS (sick sinus syndrome)     a. H/o AV node ablation 2008 secondary to difficult to control afib/flutter, with subsequent pacer.  . Atrial fibrillation with rapid ventricular response     a. H/o amiodarone thyroiditis. b. s/p AV node ablation 2008 with implantation of PPM 01/2007, upgraded to CRT-P 06/2007.;takes Coumadin daily  . GERD (gastroesophageal reflux disease)     takes Protonix daily  . Chronic diastolic CHF (congestive heart failure)     takes Lasix daily and Spironolactone   . Myocardial infarction 1996  . Pacemaker     Medtronic  . Shortness of breath     with exertion or sitting    . Migraines     last migraine has been a while but headaches dail  . Chronic back pain     deteriorating disc  . History of gout   . Bruises easily   . Gastric ulcer     hx of  . Hemorrhoids   . Constipation   . Diarrhea   . History of kidney stones   . Urinary frequency   . Nocturia   . Depression     takes Xanax daily  . Insomnia     takes Xanax if needed    Current Outpatient Prescriptions  Medication Sig Dispense Refill  . ALPRAZolam (XANAX) 0.5 MG tablet Take 0.5 mg by mouth 3 (three) times daily as needed.       Marland Kitchen amLODipine (NORVASC) 5 MG tablet Take 2.5 mg by mouth daily.      Marland Kitchen aspirin EC 81 MG tablet Take 81 mg by mouth daily. Does not take daily only when she feels like taking it      . ferrous sulfate 325 (65 FE) MG tablet Take 325 mg by mouth daily with breakfast.       . furosemide (LASIX) 40 MG tablet Take 1 tablet (40mg ) by mouth in AM  90 tablet  3  . hydrALAZINE (APRESOLINE) 25 MG tablet Take 1 tablet (25 mg total) by mouth 3 (three) times daily.  90 tablet  3  . isosorbide mononitrate (IMDUR) 60 MG 24 hr tablet Take 1 tablet (60 mg total) by mouth daily.  30 tablet  5  . lovastatin (MEVACOR) 10 MG tablet Take 10 mg by mouth at bedtime.       . magnesium oxide (MAG-OX) 400 MG tablet Take 400 mg by mouth at bedtime.       . metoprolol (LOPRESSOR) 100 MG tablet Take 50 mg by mouth 3 (three) times daily.       . nitroGLYCERIN (NITROSTAT) 0.4 MG SL tablet Place 0.4 mg under the tongue every 5 (five) minutes as needed for chest pain.      Marland Kitchen oxyCODONE-acetaminophen (PERCOCET/ROXICET) 5-325 MG per tablet Take 1 tablet by mouth 3 (three) times daily as needed for moderate pain. For pain  30 tablet  0  . pantoprazole (PROTONIX) 40 MG tablet Take 40 mg by mouth 2 (two) times daily.       . paricalcitol (ZEMPLAR) 1 MCG capsule Take 1 mcg by mouth at bedtime.      . potassium chloride (K-DUR,KLOR-CON) 10 MEQ tablet Take 1 tablet (10 mEq total) by mouth daily.  30 tablet  6   . spironolactone (ALDACTONE) 25 MG tablet Take 12.5 mg by mouth daily.      Marland Kitchen  warfarin (COUMADIN) 2 MG tablet Take 2-3 mg by mouth at bedtime. Pt takes 1 & 1/2 tabs on monday, Friday, and 1 tab all other days      . [DISCONTINUED] FLUoxetine (PROZAC) 20 MG capsule Take 20 mg by mouth daily.         No current facility-administered medications for this visit.    Allergies:   Amiodarone and Propylthiouracil   Social History:  The patient  reports that she has quit smoking. Her smoking use included Cigarettes. She has a 50 pack-year smoking history. She has never used smokeless tobacco. She reports that she does not drink alcohol or use illicit drugs.   Family History:  The patient's family history includes Hypertension in her brother; Kidney disease in her brother; Other in her father and mother; Stroke in her brother. There is no history of Colon cancer or Thyroid disease.   ROS:  Please see the history of present illness.   She has a non-productive cough.  She has also noted some diarrhea.   All other systems reviewed and negative.   PHYSICAL EXAM: VS:  BP 118/68  Pulse 66  Ht 5\' 7"  (1.702 m)  Wt 208 lb (94.348 kg)  BMI 32.57 kg/m2 Well nourished, well developed, in no acute distress HEENT: normal Neck: + JVD Cardiac:  normal S1, S2; irreg irreg rhytm; no murmur Lungs:  clear to auscultation bilaterally, no wheezing, rhonchi or rales Abd: distended Ext: 1+ bilateral LE edema Skin: warm and dry Neuro:  CNs 2-12 intact, no focal abnormalities noted  EKG:  V paced HR 66     ASSESSMENT AND PLAN:  1. Exertional Angina: Patient has had progressively worsening chest pain with exertion. She is describing CCS class III symptoms. She has been medically managed because of her chronic kidney disease.  She recently had a right basilic vein transposition in attempts to gain access for dialysis. She is also volume overloaded today.  I reviewed her case today with Dr. Shirlee Latch. Given her risk for  contrast-induced nephropathy and inadequate access for hemodialysis, we will try to continue to advance her medical therapy.  We will increase her metoprolol to 75 mg 3 times a day and increase her isosorbide to 120 mg daily. She knows to go to the emergency room if she has worsening or unrelenting symptoms. 2. Acute on Chronic Diastolic CHF: Adjust Lasix to 40 mg twice a day. Check a basic metabolic panel today and repeat in one week. 3. CAD: Adjust medications as noted above. She will be high risk for contrast-induced nephropathy with cardiac catheterization.  Continue to try conservative management. Continue aspirin, statin, beta blocker, nitrates, amlodipine. 4. Chronic Kidney Disease, Stage IV: Continue follow up with nephrology (Dr. Arrie Aran).  Monitor renal function closely with adjustments in diuretics.  F/u with Dr. Myra Gianotti for vascular access. 5. Hypertension: Controlled 6. Atrial Fibrillation:  Continue Coumadin. 7. Hyperlipidemia: Continue statin. 8. Disposition: Follow up with me or Dr. Eden [name] in one week.  Signed, Tereso Newcomer, PA-C  07/01/2013 9:21 AM

## 2013-07-05 ENCOUNTER — Telehealth: Payer: Self-pay | Admitting: *Deleted

## 2013-07-05 ENCOUNTER — Other Ambulatory Visit (INDEPENDENT_AMBULATORY_CARE_PROVIDER_SITE_OTHER): Payer: Medicare Other

## 2013-07-05 DIAGNOSIS — N184 Chronic kidney disease, stage 4 (severe): Secondary | ICD-10-CM

## 2013-07-05 DIAGNOSIS — I5033 Acute on chronic diastolic (congestive) heart failure: Secondary | ICD-10-CM

## 2013-07-05 LAB — BASIC METABOLIC PANEL
BUN: 41 mg/dL — ABNORMAL HIGH (ref 6–23)
CO2: 26 mEq/L (ref 19–32)
Calcium: 9.8 mg/dL (ref 8.4–10.5)
Creatinine, Ser: 3 mg/dL — ABNORMAL HIGH (ref 0.4–1.2)
GFR: 16.22 mL/min — ABNORMAL LOW (ref >60.00)
Glucose, Bld: 85 mg/dL (ref 70–99)
Potassium: 3.5 mEq/L (ref 3.5–5.1)

## 2013-07-05 NOTE — Telephone Encounter (Signed)
lmom creatinine stable, K+ normal. Continue current regimen

## 2013-07-09 ENCOUNTER — Encounter: Payer: Self-pay | Admitting: *Deleted

## 2013-07-09 ENCOUNTER — Ambulatory Visit (INDEPENDENT_AMBULATORY_CARE_PROVIDER_SITE_OTHER): Payer: Medicare Other | Admitting: Physician Assistant

## 2013-07-09 ENCOUNTER — Ambulatory Visit (INDEPENDENT_AMBULATORY_CARE_PROVIDER_SITE_OTHER)
Admission: RE | Admit: 2013-07-09 | Discharge: 2013-07-09 | Disposition: A | Discharge status: Home / Self Care | Payer: Medicare Other | Source: Ambulatory Visit | Attending: Physician Assistant | Admitting: Physician Assistant

## 2013-07-09 ENCOUNTER — Ambulatory Visit (INDEPENDENT_AMBULATORY_CARE_PROVIDER_SITE_OTHER): Payer: Medicare Other

## 2013-07-09 ENCOUNTER — Encounter: Payer: Self-pay | Admitting: Physician Assistant

## 2013-07-09 VITALS — BP 164/82 | HR 66 | Ht 67.0 in | Wt 207.8 lb

## 2013-07-09 DIAGNOSIS — I10 Essential (primary) hypertension: Secondary | ICD-10-CM

## 2013-07-09 DIAGNOSIS — N184 Chronic kidney disease, stage 4 (severe): Secondary | ICD-10-CM

## 2013-07-09 DIAGNOSIS — I2510 Atherosclerotic heart disease of native coronary artery without angina pectoris: Secondary | ICD-10-CM

## 2013-07-09 DIAGNOSIS — I208 Other forms of angina pectoris: Secondary | ICD-10-CM

## 2013-07-09 DIAGNOSIS — E785 Hyperlipidemia, unspecified: Secondary | ICD-10-CM

## 2013-07-09 DIAGNOSIS — I5032 Chronic diastolic (congestive) heart failure: Secondary | ICD-10-CM

## 2013-07-09 DIAGNOSIS — I4891 Unspecified atrial fibrillation: Secondary | ICD-10-CM

## 2013-07-09 DIAGNOSIS — I209 Angina pectoris, unspecified: Secondary | ICD-10-CM

## 2013-07-09 LAB — CBC WITH DIFFERENTIAL/PLATELET
Basophils Absolute: 0 10*3/uL (ref 0.0–0.1)
Eosinophils Absolute: 0.3 10*3/uL (ref 0.0–0.7)
Eosinophils Relative: 4.4 % (ref 0.0–5.0)
Lymphocytes Relative: 12.9 % (ref 12.0–46.0)
MCV: 87.9 fl (ref 78.0–100.0)
Monocytes Absolute: 0.4 10*3/uL (ref 0.1–1.0)
Monocytes Relative: 6.7 % (ref 3.0–12.0)
Neutrophils Relative %: 75.7 % (ref 43.0–77.0)
Platelets: 203 10*3/uL (ref 150.0–400.0)
RDW: 15 % — ABNORMAL HIGH (ref 11.5–14.6)
WBC: 6.2 10*3/uL (ref 4.5–10.5)

## 2013-07-09 LAB — PROTIME-INR
INR: 2.2 ratio — ABNORMAL HIGH (ref 0.8–1.0)
Prothrombin Time: 22.8 s — ABNORMAL HIGH (ref 10.2–12.4)

## 2013-07-09 MED ORDER — AMLODIPINE BESYLATE 5 MG PO TABS: 5.0000 mg | ORAL_TABLET | Freq: Every day | ORAL | Status: DC

## 2013-07-09 NOTE — H&P (Signed)
History and Physical  Date:  07/09/2013   ID:  [name], DOB 1934-10-10, MRN [id number]  PCP:  [name], MD  Cardiologist:  Dr. Charlton Haws   Electrophysiologist:  Dr. Lewayne Bunting  Nephrologist: Dr. Arrie Aran   History of Present Illness: [name] is a 78 y.o. female with a hx of CAD, s/p DES to the OM in 6/06, rotational atherectomy + DES to pLAD in 7/09, symptomatic bradycardia status post pacemaker with upgrade to CRT-P 11/08, atrial fibrillation with prior AV node ablation (prior amiodarone thyroiditis), chronic diastolic CHF, mitral regurgitation, chronic kidney disease stage IV, HTN, HL, GERD, prior upper GI bleed in 2008 related to NSAID use, PVD.    Myoview (01/2009): EF 79%, no scar or ischemia.  LHC (12/2010): Proximal LAD stent patent with 30% distal to stent, mid LAD 30%, ostial first septal branch 80%, OM1 stent patent (10-20% ISR), mid circumflex 20%, proximal RCA 30-40%, mid RCA 50-60%-medical therapy continued.  Echo (03/2013):  Mild focal basal septal hypertrophy, EF 55-60%, mild MR, moderate to severe LAE, mild to moderate RVE, reduced RVSF, moderate RAE.    She has been followed closely with chest pain. Medications have been adjusted. Dr. [name] has been trying to avoid cardiac catheterization due to chronic renal failure. Once she has adequate access, this could certainly be considered.   I saw her last week with worsening chest pain and dyspnea with exertion over the last few weeks.  She also noted a recent weight gain and she took extra Lasix (wt 10/16 was 199 => 11/10 was 210 => today 204 at home).  She was taking NTG several times a day with relief of her symptoms.  I reviewed case with Dr. Shirlee Latch. Given her high risk for contrast-induced nephropathy and inadequate access for hemodialysis, we decided to continue to advance her medical therapy. I adjusted her beta blocker and nitrates. I also increased her diuresis for volume overload.  K+ and creatinine  has remained stable with diuresis.  Since last seen, she continues to have chest pain with minimal exertion as well as dyspnea with minimal exertion. She denies orthopnea, PND or. LE edema is unchanged. She denies syncope.  Recent Labs: 03/27/2013: Pro B Natriuretic peptide (BNP) 288.0*  06/21/2013: Hemoglobin 12.9  07/05/2013: Creatinine 3.0*; Potassium 3.5   Wt Readings from Last 3 Encounters:  07/09/13 207 lb 12.8 oz (94.257 kg)  07/01/13 208 lb (94.348 kg)  06/18/13 205 lb (92.987 kg)     Past Medical History  Diagnosis Date  . Cardiac pacemaker in situ   . Cellulitis and abscess of unspecified site     a. h/o MSRA cellulitis of abdominal wall.  Marland Kitchen CAD (coronary artery disease)     a. DES to OM 01/2005. b. Rotational atherotomy/DES to prox LAD 02/2008.  . Edema   . Chronic renal insufficiency     Cr ~2.3.  . Warfarin anticoagulation     a. Followed by PCP.  Marland Kitchen GI bleed     a. EGD 01/2007: antral ulcers related to NSAID/aspirin use.  . Polycystic kidney disease     a. Per remote E-Chart records.  . Peripheral vascular disease   . Hyperthyroidism     a. Hx of intolerance to PTU.  Marland Kitchen Blood transfusion     no abnormal reaction  . H/O hiatal hernia   . Umbilical hernia     unrepaired (11/24/11)  . Arthritis   . Valvular heart disease     Echo 11/2011: mild-mod  MR.  . Hypertension     takes Amlodipine,Hydralazine,and Imdur daily  . Hyperlipidemia     takes Lovastatin daily  . Anemia, unspecified     takes an Iron Pill daily  . SSS (sick sinus syndrome)     a. H/o AV node ablation 2008 secondary to difficult to control afib/flutter, with subsequent pacer.  . Atrial fibrillation with rapid ventricular response     a. H/o amiodarone thyroiditis. b. s/p AV node ablation 2008 with implantation of PPM 01/2007, upgraded to CRT-P 06/2007.;takes Coumadin daily  . GERD (gastroesophageal reflux disease)     takes Protonix daily  . Chronic diastolic CHF (congestive heart failure)      takes Lasix daily and Spironolactone   . Myocardial infarction 1996  . Pacemaker     Medtronic  . Shortness of breath     with exertion or sitting  . Migraines     last migraine has been a while but headaches dail  . Chronic back pain     deteriorating disc  . History of gout   . Bruises easily   . Gastric ulcer     hx of  . Hemorrhoids   . Constipation   . Diarrhea   . History of kidney stones   . Urinary frequency   . Nocturia   . Depression     takes Xanax daily  . Insomnia     takes Xanax if needed   Past Surgical History  Procedure Laterality Date  . Esophagogastroduodenoscopy    . Tonsillectomy  ~ 1960  . Cardiac catheterization  2006; 2008; 2009; 01/13/2011  . Back surgery    . Laminectomy and microdiscectomy lumbar spine  08/2003  . Cardioversion  10/2004; 02/2007    /E-chart  . Cholecystectomy    . Appendectomy  1954  . Vaginal hysterectomy  ?1981  . Av node ablation  02/2007    /E-chart  . Insert / replace / remove pacemaker  01/2007    initial placement  . Insert / replace / remove pacemaker  06/2007    upgraded/E-chart  . Av fistula placement, brachiocephalic  09/2007    right; "didn't work; my veins were too small"  . Joint replacement    . Total knee arthroplasty  01/2009    left  . Bladder and bowel  1970's    "tacked; damaged my intestines & had to remove some; done @ [facility]  . Coronary angioplasty  2    percutanous transluminal   . Bilateral cataract surgery    . Bascilic vein transposition Right 06/21/2013    Procedure: BASCILIC VEIN TRANSPOSITION 1ST STAGE;  Surgeon: Nada Libman, MD;  Location: [facility] OR;  Service: Vascular;  Laterality: Right;     Current Outpatient Prescriptions  Medication Sig Dispense Refill  . ALPRAZolam (XANAX) 0.5 MG tablet Take 0.5 mg by mouth 3 (three) times daily as needed.       Marland Kitchen amLODipine (NORVASC) 5 MG tablet Take 2.5 mg by mouth daily.      Marland Kitchen aspirin EC 81 MG tablet Take 81 mg by mouth daily. Does not take  daily only when she feels like taking it      . ferrous sulfate 325 (65 FE) MG tablet Take 325 mg by mouth daily with breakfast.       . furosemide (LASIX) 40 MG tablet Take 40 mg twice daily  60 tablet  3  . hydrALAZINE (APRESOLINE) 25 MG tablet Take 1 tablet (25 mg  total) by mouth 3 (three) times daily.  90 tablet  3  . isosorbide mononitrate (IMDUR) 60 MG 24 hr tablet Take 2 tablets (120 mg total) by mouth daily.  60 tablet  3  . lovastatin (MEVACOR) 10 MG tablet Take 10 mg by mouth at bedtime.       . magnesium oxide (MAG-OX) 400 MG tablet Take 400 mg by mouth at bedtime.       . metoprolol tartrate (LOPRESSOR) 25 MG tablet Take 3 tablets (75 mg total) by mouth 3 (three) times daily.  256 tablet  3  . nitroGLYCERIN (NITROSTAT) 0.4 MG SL tablet Place 0.4 mg under the tongue every 5 (five) minutes as needed for chest pain.      Marland Kitchen oxyCODONE-acetaminophen (PERCOCET/ROXICET) 5-325 MG per tablet Take 1 tablet by mouth 3 (three) times daily as needed for moderate pain. For pain  30 tablet  0  . pantoprazole (PROTONIX) 40 MG tablet Take 40 mg by mouth 2 (two) times daily.       . paricalcitol (ZEMPLAR) 1 MCG capsule Take 1 mcg by mouth at bedtime.      . potassium chloride (K-DUR,KLOR-CON) 10 MEQ tablet Take 1 tablet (10 mEq total) by mouth daily.  30 tablet  6  . spironolactone (ALDACTONE) 25 MG tablet Take 12.5 mg by mouth daily.      Marland Kitchen warfarin (COUMADIN) 2 MG tablet Take 2-3 mg by mouth at bedtime. Pt takes 1 & 1/2 tabs on monday, Friday, and 1 tab all other days      . [DISCONTINUED] FLUoxetine (PROZAC) 20 MG capsule Take 20 mg by mouth daily.         No current facility-administered medications for this visit.    Allergies:   Amiodarone and Propylthiouracil   Social History:  The patient  reports that she has quit smoking. Her smoking use included Cigarettes. She has a 50 pack-year smoking history. She has never used smokeless tobacco. She reports that she does not drink alcohol or use  illicit drugs.   Family History:  The patient's family history includes Hypertension in her brother; Kidney disease in her brother; Other in her father and mother; Stroke in her brother. There is no history of Colon cancer or Thyroid disease.   ROS:  Please see the history of present illness.  She has a nonproductive cough.  All other systems reviewed and negative.   PHYSICAL EXAM: VS:  BP 164/82  Pulse 66  Ht 5\' 7"  (1.702 m)  Wt 207 lb 12.8 oz (94.257 kg)  BMI 32.54 kg/m2 Well nourished, well developed, in no acute distress HEENT: normal Neck: no JVD Cardiac:  normal S1, S2; irreg irreg rhytm; no murmur Lungs:  Bibasilar crackles, no wheezing Abd: distended Ext: 1+ bilateral LE edema Skin: warm and dry Neuro:  CNs 2-12 intact, no focal abnormalities noted  EKG:   V. Paced, HR 66   ASSESSMENT AND PLAN:  1. Exertional Angina:  She continues to have CCS class III (possibly class IV) angina. I reviewed her case today with Dr. [name]. We feel the patient requires cardiac catheterization for further management. She does not have adequate hemodialysis access. I have been in touch with her nephrologist today. We will have the patient come in to the hospital next week with plans to place a temporary dialysis catheter. She will be admitted the night before her cardiac catheterization. We'll gently hydrate her and use bicarbonate protocol. Nephrology will need to be consulted as soon  as she is admitted to follow along. Either interventional radiology or critical care will also need to be consulted to arrange placement of temporary dialysis catheter. Her Coumadin will be held 3 days prior to her procedure. This will be resumed post catheterization when able.  Dialysis will be initiated post catheterization if indicated. 2. Chronic Diastolic CHF:  She still has some signs of volume overload. Given her chronic renal failure, volume management is difficult. I will check a basic metabolic panel, BNP and  chest x-ray today. If  She has edema on her chest x-ray, we will need to advance her Lasix further. 3. CAD:  Continue aspirin, statin, beta blocker, nitrates, amlodipine.  I will increase amlodipine to 5 mg for anti-anginal therapy. Proceed with cardiac catheterization as noted. 4. Chronic Kidney Disease, Stage IV:  Continue follow up with nephrology (Dr. Arrie Aran).   F/u with Dr. Myra Gianotti for vascular access. 5. Hypertension:  Controlled 6. Atrial Fibrillation:   Continue Coumadin.  Hold 3 days before catheterization. She does not have a history of stroke and does not need Lovenox bridging. 7. Hyperlipidemia:  Continue statin. 8. Disposition: Follow up with Dr. [name] post catheterization.  Signed, Tereso Newcomer, PA-C  07/09/2013 2:50 PM    Patient examined chart reviewed. Discussed care and decision making process with patient She is having accelerated angina symptoms despite good medical Rx.  Will be needing dialysis Soon and access issues have already been addressed. Cath complicated by nee for hydration, holding coumadin and need for possible temporary dialysis access after cath.  Patient understands the need to move forward with diagnostic cath to prevent MI and assess CAD  Charlton Haws

## 2013-07-09 NOTE — Progress Notes (Signed)
[address], [address] [place], [place]  [address] Phone: [phone] Fax:  [phone]  Date:  07/09/2013   ID:  TAURI ETHINGTON, DOB 1934/11/18, MRN [id number]  PCP:  [name], MD  Cardiologist:  Dr. Charlton Haws   Electrophysiologist:  Dr. Lewayne Bunting  Nephrologist: Dr. Arrie Aran   History of Present Illness: [name] is a 78 y.o. female with a hx of CAD, s/p DES to the OM in 6/06, rotational atherectomy + DES to pLAD in 7/09, symptomatic bradycardia status post pacemaker with upgrade to CRT-P 11/08, atrial fibrillation with prior AV node ablation (prior amiodarone thyroiditis), chronic diastolic CHF, mitral regurgitation, chronic kidney disease stage IV, HTN, HL, GERD, prior upper GI bleed in 2008 related to NSAID use, PVD.    Myoview (01/2009): EF 79%, no scar or ischemia.  LHC (12/2010): Proximal LAD stent patent with 30% distal to stent, mid LAD 30%, ostial first septal branch 80%, OM1 stent patent (10-20% ISR), mid circumflex 20%, proximal RCA 30-40%, mid RCA 50-60%-medical therapy continued.  Echo (03/2013):  Mild focal basal septal hypertrophy, EF 55-60%, mild MR, moderate to severe LAE, mild to moderate RVE, reduced RVSF, moderate RAE.    She has been followed closely with chest pain. Medications have been adjusted. Dr. [name] has been trying to avoid cardiac catheterization due to chronic renal failure. Once she has adequate access, this could certainly be considered.   I saw her last week with worsening chest pain and dyspnea with exertion over the last few weeks.  She also noted a recent weight gain and she took extra Lasix (wt 10/16 was 199 => 11/10 was 210 => today 204 at home).  She was taking NTG several times a day with relief of her symptoms.  I reviewed case with Dr. Shirlee Latch. Given her high risk for contrast-induced nephropathy and inadequate access for hemodialysis, we decided to continue to advance her medical therapy. I adjusted her beta blocker and  nitrates. I also increased her diuresis for volume overload.  K+ and creatinine has remained stable with diuresis.  Since last seen, she continues to have chest pain with minimal exertion as well as dyspnea with minimal exertion. She denies orthopnea, PND or. LE edema is unchanged. She denies syncope.  Recent Labs: 03/27/2013: Pro B Natriuretic peptide (BNP) 288.0*  06/21/2013: Hemoglobin 12.9  07/05/2013: Creatinine 3.0*; Potassium 3.5   Wt Readings from Last 3 Encounters:  07/09/13 207 lb 12.8 oz (94.257 kg)  07/01/13 208 lb (94.348 kg)  06/18/13 205 lb (92.987 kg)     Past Medical History  Diagnosis Date  . Cardiac pacemaker in situ   . Cellulitis and abscess of unspecified site     a. h/o MSRA cellulitis of abdominal wall.  Marland Kitchen CAD (coronary artery disease)     a. DES to OM 01/2005. b. Rotational atherotomy/DES to prox LAD 02/2008.  . Edema   . Chronic renal insufficiency     Cr ~2.3.  . Warfarin anticoagulation     a. Followed by PCP.  Marland Kitchen GI bleed     a. EGD 01/2007: antral ulcers related to NSAID/aspirin use.  . Polycystic kidney disease     a. Per remote E-Chart records.  . Peripheral vascular disease   . Hyperthyroidism     a. Hx of intolerance to PTU.  Marland Kitchen Blood transfusion     no abnormal reaction  . H/O hiatal hernia   . Umbilical hernia     unrepaired (11/24/11)  .  Arthritis   . Valvular heart disease     Echo 11/2011: mild-mod MR.  Marland Kitchen Hypertension     takes Amlodipine,Hydralazine,and Imdur daily  . Hyperlipidemia     takes Lovastatin daily  . Anemia, unspecified     takes an Iron Pill daily  . SSS (sick sinus syndrome)     a. H/o AV node ablation 2008 secondary to difficult to control afib/flutter, with subsequent pacer.  . Atrial fibrillation with rapid ventricular response     a. H/o amiodarone thyroiditis. b. s/p AV node ablation 2008 with implantation of PPM 01/2007, upgraded to CRT-P 06/2007.;takes Coumadin daily  . GERD (gastroesophageal reflux disease)      takes Protonix daily  . Chronic diastolic CHF (congestive heart failure)     takes Lasix daily and Spironolactone   . Myocardial infarction 1996  . Pacemaker     Medtronic  . Shortness of breath     with exertion or sitting  . Migraines     last migraine has been a while but headaches dail  . Chronic back pain     deteriorating disc  . History of gout   . Bruises easily   . Gastric ulcer     hx of  . Hemorrhoids   . Constipation   . Diarrhea   . History of kidney stones   . Urinary frequency   . Nocturia   . Depression     takes Xanax daily  . Insomnia     takes Xanax if needed   Past Surgical History  Procedure Laterality Date  . Esophagogastroduodenoscopy    . Tonsillectomy  ~ 1960  . Cardiac catheterization  2006; 2008; 2009; 01/13/2011  . Back surgery    . Laminectomy and microdiscectomy lumbar spine  08/2003  . Cardioversion  10/2004; 02/2007    /E-chart  . Cholecystectomy    . Appendectomy  1954  . Vaginal hysterectomy  ?1981  . Av node ablation  02/2007    /E-chart  . Insert / replace / remove pacemaker  01/2007    initial placement  . Insert / replace / remove pacemaker  06/2007    upgraded/E-chart  . Av fistula placement, brachiocephalic  09/2007    right; "didn't work; my veins were too small"  . Joint replacement    . Total knee arthroplasty  01/2009    left  . Bladder and bowel  1970's    "tacked; damaged my intestines & had to remove some; done @ [facility]  . Coronary angioplasty  2    percutanous transluminal   . Bilateral cataract surgery    . Bascilic vein transposition Right 06/21/2013    Procedure: BASCILIC VEIN TRANSPOSITION 1ST STAGE;  Surgeon: Nada Libman, MD;  Location: [facility] OR;  Service: Vascular;  Laterality: Right;     Current Outpatient Prescriptions  Medication Sig Dispense Refill  . ALPRAZolam (XANAX) 0.5 MG tablet Take 0.5 mg by mouth 3 (three) times daily as needed.       Marland Kitchen amLODipine (NORVASC) 5 MG tablet Take 2.5 mg by mouth  daily.      Marland Kitchen aspirin EC 81 MG tablet Take 81 mg by mouth daily. Does not take daily only when she feels like taking it      . ferrous sulfate 325 (65 FE) MG tablet Take 325 mg by mouth daily with breakfast.       . furosemide (LASIX) 40 MG tablet Take 40 mg twice daily  60 tablet  3  . hydrALAZINE (APRESOLINE) 25 MG tablet Take 1 tablet (25 mg total) by mouth 3 (three) times daily.  90 tablet  3  . isosorbide mononitrate (IMDUR) 60 MG 24 hr tablet Take 2 tablets (120 mg total) by mouth daily.  60 tablet  3  . lovastatin (MEVACOR) 10 MG tablet Take 10 mg by mouth at bedtime.       . magnesium oxide (MAG-OX) 400 MG tablet Take 400 mg by mouth at bedtime.       . metoprolol tartrate (LOPRESSOR) 25 MG tablet Take 3 tablets (75 mg total) by mouth 3 (three) times daily.  256 tablet  3  . nitroGLYCERIN (NITROSTAT) 0.4 MG SL tablet Place 0.4 mg under the tongue every 5 (five) minutes as needed for chest pain.      Marland Kitchen oxyCODONE-acetaminophen (PERCOCET/ROXICET) 5-325 MG per tablet Take 1 tablet by mouth 3 (three) times daily as needed for moderate pain. For pain  30 tablet  0  . pantoprazole (PROTONIX) 40 MG tablet Take 40 mg by mouth 2 (two) times daily.       . paricalcitol (ZEMPLAR) 1 MCG capsule Take 1 mcg by mouth at bedtime.      . potassium chloride (K-DUR,KLOR-CON) 10 MEQ tablet Take 1 tablet (10 mEq total) by mouth daily.  30 tablet  6  . spironolactone (ALDACTONE) 25 MG tablet Take 12.5 mg by mouth daily.      Marland Kitchen warfarin (COUMADIN) 2 MG tablet Take 2-3 mg by mouth at bedtime. Pt takes 1 & 1/2 tabs on monday, Friday, and 1 tab all other days      . [DISCONTINUED] FLUoxetine (PROZAC) 20 MG capsule Take 20 mg by mouth daily.         No current facility-administered medications for this visit.    Allergies:   Amiodarone and Propylthiouracil   Social History:  The patient  reports that she has quit smoking. Her smoking use included Cigarettes. She has a 50 pack-year smoking history. She has never  used smokeless tobacco. She reports that she does not drink alcohol or use illicit drugs.   Family History:  The patient's family history includes Hypertension in her brother; Kidney disease in her brother; Other in her father and mother; Stroke in her brother. There is no history of Colon cancer or Thyroid disease.   ROS:  Please see the history of present illness.  She has a nonproductive cough.  All other systems reviewed and negative.   PHYSICAL EXAM: VS:  BP 164/82  Pulse 66  Ht 5\' 7"  (1.702 m)  Wt 207 lb 12.8 oz (94.257 kg)  BMI 32.54 kg/m2 Well nourished, well developed, in no acute distress HEENT: normal Neck: no JVD Cardiac:  normal S1, S2; irreg irreg rhytm; no murmur Lungs:  Bibasilar crackles, no wheezing Abd: distended Ext: 1+ bilateral LE edema Skin: warm and dry Neuro:  CNs 2-12 intact, no focal abnormalities noted  EKG:   V. Paced, HR 66   ASSESSMENT AND PLAN:  1. Exertional Angina:  She continues to have CCS class III (possibly class IV) angina. I reviewed her case today with Dr. [name]. We feel the patient requires cardiac catheterization for further management. She does not have adequate hemodialysis access. I have been in touch with her nephrologist today. We will have the patient come in to the hospital next week with plans to place a temporary dialysis catheter. She will be admitted the night before her cardiac catheterization. We'll gently hydrate  her and use bicarbonate protocol. Nephrology will need to be consulted as soon as she is admitted to follow along. Either interventional radiology or critical care will also need to be consulted to arrange placement of temporary dialysis catheter. Her Coumadin will be held 3 days prior to her procedure. This will be resumed post catheterization when able.  Dialysis will be initiated post catheterization if indicated. 2. Chronic Diastolic CHF:  She still has some signs of volume overload. Given her chronic renal failure,  volume management is difficult. I will check a basic metabolic panel, BNP and chest x-ray today. If  She has edema on her chest x-ray, we will need to advance her Lasix further. 3. CAD:  Continue aspirin, statin, beta blocker, nitrates, amlodipine.  I will increase amlodipine to 5 mg for anti-anginal therapy. Proceed with cardiac catheterization as noted. 4. Chronic Kidney Disease, Stage IV:  Continue follow up with nephrology (Dr. Arrie Aran).   F/u with Dr. Myra Gianotti for vascular access. 5. Hypertension:  Controlled 6. Atrial Fibrillation:   Continue Coumadin.  Hold 3 days before catheterization. She does not have a history of stroke and does not need Lovenox bridging. 7. Hyperlipidemia:  Continue statin. 8. Disposition: Follow up with Dr. [name] post catheterization.  Signed, Tereso Newcomer, PA-C  07/09/2013 2:50 PM

## 2013-07-09 NOTE — Addendum Note (Signed)
Addended byAlben Spittle, Lorin Picket T on: 07/09/2013 05:10 PM   Modules accepted: Level of Service

## 2013-07-09 NOTE — Patient Instructions (Signed)
INCREASE AMLODIPINE TO 5 MG DAILY  Your physician has requested that you have a cardiac catheterization. LEFT HEART CATH WITH DR. Excell Seltzer ON 07/15/13. Cardiac catheterization is used to diagnose and/or treat various heart conditions. Doctors may recommend this procedure for a number of different reasons. The most common reason is to evaluate chest pain. Chest pain can be a symptom of coronary artery disease (CAD), and cardiac catheterization can show whether plaque is narrowing or blocking your heart's arteries. This procedure is also used to evaluate the valves, as well as measure the blood flow and oxygen levels in different parts of your heart. For further information please visit [email or web address] Please follow instruction sheet, as given.  YOU WILL BE ADMITTED TO STEP DOWN UNIT AT [facility]; THEY WILL CALL YOU TO TELL WHAT TIME TO COME TO THE HOSPITAL  YOU WILL NEED TO HOLD YOUR COUMADIN 3 DAYS BEFORE YOUR CATH  LAB AND CXR TODAY AT [place] ON [address] BMET , CBC W/DIFF, BNP, PT/INR AND CXR; PRE CATH

## 2013-07-10 ENCOUNTER — Encounter (HOSPITAL_COMMUNITY): Payer: Self-pay | Admitting: Pharmacy Technician

## 2013-07-10 ENCOUNTER — Telehealth: Payer: Self-pay | Admitting: *Deleted

## 2013-07-10 LAB — BRAIN NATRIURETIC PEPTIDE: Pro B Natriuretic peptide (BNP): 412 pg/mL — ABNORMAL HIGH (ref 0.0–100.0)

## 2013-07-10 LAB — BASIC METABOLIC PANEL
BUN: 46 mg/dL — ABNORMAL HIGH (ref 6–23)
Calcium: 9.7 mg/dL (ref 8.4–10.5)
Creatinine, Ser: 3.4 mg/dL — ABNORMAL HIGH (ref 0.4–1.2)
GFR: 13.88 mL/min — CL (ref >60.00)

## 2013-07-10 NOTE — Telephone Encounter (Signed)
Notified of lab results. See result note.  Will change Lasix to 40 mg QD instead of BID

## 2013-07-10 NOTE — Telephone Encounter (Signed)
Message copied by Lorayne Bender on Wed Jul 10, 2013  1:41 PM ------      Message from: [place], [place] T      Created: Wed Jul 10, 2013  1:23 PM       Creatinine a little higher.      No edema on CXR.      K+ ok      Hgb stable      Decrease Lasix to 60 mg QD.      Tereso Newcomer, PA-C        07/10/2013 1:23 PM ------

## 2013-07-10 NOTE — Telephone Encounter (Signed)
Critical lab, GFR 13.88

## 2013-07-14 ENCOUNTER — Inpatient Hospital Stay (HOSPITAL_COMMUNITY)
Admission: AD | Admit: 2013-07-14 | Discharge: 2013-07-16 | DRG: 392 | Disposition: A | Discharge status: Home / Self Care | Payer: Medicare Other | Source: Ambulatory Visit | Attending: Cardiovascular Disease | Admitting: Cardiovascular Disease

## 2013-07-14 ENCOUNTER — Encounter (HOSPITAL_COMMUNITY): Payer: Self-pay | Admitting: *Deleted

## 2013-07-14 DIAGNOSIS — Z96659 Presence of unspecified artificial knee joint: Secondary | ICD-10-CM

## 2013-07-14 DIAGNOSIS — I4892 Unspecified atrial flutter: Secondary | ICD-10-CM | POA: Diagnosis present

## 2013-07-14 DIAGNOSIS — Z823 Family history of stroke: Secondary | ICD-10-CM

## 2013-07-14 DIAGNOSIS — I739 Peripheral vascular disease, unspecified: Secondary | ICD-10-CM | POA: Diagnosis present

## 2013-07-14 DIAGNOSIS — I509 Heart failure, unspecified: Secondary | ICD-10-CM | POA: Diagnosis present

## 2013-07-14 DIAGNOSIS — I5032 Chronic diastolic (congestive) heart failure: Secondary | ICD-10-CM | POA: Diagnosis present

## 2013-07-14 DIAGNOSIS — I129 Hypertensive chronic kidney disease with stage 1 through stage 4 chronic kidney disease, or unspecified chronic kidney disease: Secondary | ICD-10-CM | POA: Diagnosis present

## 2013-07-14 DIAGNOSIS — I4891 Unspecified atrial fibrillation: Secondary | ICD-10-CM | POA: Diagnosis present

## 2013-07-14 DIAGNOSIS — R079 Chest pain, unspecified: Secondary | ICD-10-CM | POA: Diagnosis present

## 2013-07-14 DIAGNOSIS — M109 Gout, unspecified: Secondary | ICD-10-CM | POA: Diagnosis present

## 2013-07-14 DIAGNOSIS — E785 Hyperlipidemia, unspecified: Secondary | ICD-10-CM | POA: Diagnosis present

## 2013-07-14 DIAGNOSIS — G43909 Migraine, unspecified, not intractable, without status migrainosus: Secondary | ICD-10-CM | POA: Diagnosis present

## 2013-07-14 DIAGNOSIS — M549 Dorsalgia, unspecified: Secondary | ICD-10-CM | POA: Diagnosis present

## 2013-07-14 DIAGNOSIS — I208 Other forms of angina pectoris: Secondary | ICD-10-CM

## 2013-07-14 DIAGNOSIS — Z8614 Personal history of Methicillin resistant Staphylococcus aureus infection: Secondary | ICD-10-CM

## 2013-07-14 DIAGNOSIS — Z9849 Cataract extraction status, unspecified eye: Secondary | ICD-10-CM

## 2013-07-14 DIAGNOSIS — I059 Rheumatic mitral valve disease, unspecified: Secondary | ICD-10-CM | POA: Diagnosis present

## 2013-07-14 DIAGNOSIS — Z8249 Family history of ischemic heart disease and other diseases of the circulatory system: Secondary | ICD-10-CM

## 2013-07-14 DIAGNOSIS — Z9089 Acquired absence of other organs: Secondary | ICD-10-CM

## 2013-07-14 DIAGNOSIS — N184 Chronic kidney disease, stage 4 (severe): Secondary | ICD-10-CM | POA: Diagnosis present

## 2013-07-14 DIAGNOSIS — Z7982 Long term (current) use of aspirin: Secondary | ICD-10-CM

## 2013-07-14 DIAGNOSIS — Z87442 Personal history of urinary calculi: Secondary | ICD-10-CM

## 2013-07-14 DIAGNOSIS — I495 Sick sinus syndrome: Secondary | ICD-10-CM | POA: Diagnosis present

## 2013-07-14 DIAGNOSIS — I2510 Atherosclerotic heart disease of native coronary artery without angina pectoris: Secondary | ICD-10-CM | POA: Diagnosis present

## 2013-07-14 DIAGNOSIS — I252 Old myocardial infarction: Secondary | ICD-10-CM

## 2013-07-14 DIAGNOSIS — Z87891 Personal history of nicotine dependence: Secondary | ICD-10-CM

## 2013-07-14 DIAGNOSIS — K219 Gastro-esophageal reflux disease without esophagitis: Principal | ICD-10-CM | POA: Diagnosis present

## 2013-07-14 DIAGNOSIS — E669 Obesity, unspecified: Secondary | ICD-10-CM | POA: Diagnosis present

## 2013-07-14 DIAGNOSIS — G4700 Insomnia, unspecified: Secondary | ICD-10-CM | POA: Diagnosis present

## 2013-07-14 DIAGNOSIS — K429 Umbilical hernia without obstruction or gangrene: Secondary | ICD-10-CM | POA: Diagnosis present

## 2013-07-14 DIAGNOSIS — Z950 Presence of cardiac pacemaker: Secondary | ICD-10-CM

## 2013-07-14 DIAGNOSIS — G8929 Other chronic pain: Secondary | ICD-10-CM | POA: Diagnosis present

## 2013-07-14 DIAGNOSIS — Z7901 Long term (current) use of anticoagulants: Secondary | ICD-10-CM

## 2013-07-14 DIAGNOSIS — F329 Major depressive disorder, single episode, unspecified: Secondary | ICD-10-CM | POA: Diagnosis present

## 2013-07-14 DIAGNOSIS — I209 Angina pectoris, unspecified: Secondary | ICD-10-CM

## 2013-07-14 DIAGNOSIS — Z6832 Body mass index (BMI) 32.0-32.9, adult: Secondary | ICD-10-CM

## 2013-07-14 DIAGNOSIS — Z9861 Coronary angioplasty status: Secondary | ICD-10-CM

## 2013-07-14 DIAGNOSIS — F3289 Other specified depressive episodes: Secondary | ICD-10-CM | POA: Diagnosis present

## 2013-07-14 DIAGNOSIS — Z23 Encounter for immunization: Secondary | ICD-10-CM

## 2013-07-14 DIAGNOSIS — Z79899 Other long term (current) drug therapy: Secondary | ICD-10-CM

## 2013-07-14 DIAGNOSIS — Z841 Family history of disorders of kidney and ureter: Secondary | ICD-10-CM

## 2013-07-14 LAB — MRSA PCR SCREENING: MRSA by PCR: NEGATIVE

## 2013-07-14 LAB — BASIC METABOLIC PANEL
BUN: 38 mg/dL — ABNORMAL HIGH (ref 6–23)
CO2: 22 mEq/L (ref 19–32)
Chloride: 95 mEq/L — ABNORMAL LOW (ref 96–112)
GFR calc non Af Amer: 14 mL/min — ABNORMAL LOW (ref >90)
Glucose, Bld: 97 mg/dL (ref 70–99)
Potassium: 4.3 mEq/L (ref 3.5–5.1)
Sodium: 129 mEq/L — ABNORMAL LOW (ref 135–145)

## 2013-07-14 LAB — PROTIME-INR
INR: 1.27 (ref 0.00–1.49)
Prothrombin Time: 15.6 seconds — ABNORMAL HIGH (ref 11.6–15.2)

## 2013-07-14 MED ORDER — HEPARIN (PORCINE) IN NACL 100-0.45 UNIT/ML-% IJ SOLN
1100.0000 [IU]/h | INTRAMUSCULAR | Status: DC
  Administered 2013-07-14: 1300 [IU]/h via INTRAVENOUS
  Filled 2013-07-14 (×2): qty 250

## 2013-07-14 MED ORDER — ACETAMINOPHEN 325 MG PO TABS
650.0000 mg | ORAL_TABLET | ORAL | Status: DC | PRN
  Administered 2013-07-15 – 2013-07-16 (×2): 650 mg via ORAL
  Filled 2013-07-14: qty 2

## 2013-07-14 MED ORDER — HYDRALAZINE HCL 25 MG PO TABS
25.0000 mg | ORAL_TABLET | Freq: Three times a day (TID) | ORAL | Status: DC
  Administered 2013-07-14 – 2013-07-16 (×5): 25 mg via ORAL
  Filled 2013-07-14 (×8): qty 1

## 2013-07-14 MED ORDER — ASPIRIN 81 MG PO CHEW: 81.0000 mg | CHEWABLE_TABLET | ORAL | Status: DC

## 2013-07-14 MED ORDER — PANTOPRAZOLE SODIUM 40 MG PO TBEC
40.0000 mg | DELAYED_RELEASE_TABLET | Freq: Two times a day (BID) | ORAL | Status: DC
  Administered 2013-07-14 – 2013-07-16 (×3): 40 mg via ORAL
  Filled 2013-07-14 (×3): qty 1

## 2013-07-14 MED ORDER — ALPRAZOLAM 0.5 MG PO TABS: 0.5000 mg | ORAL_TABLET | Freq: Three times a day (TID) | ORAL | Status: DC | PRN

## 2013-07-14 MED ORDER — SODIUM CHLORIDE 0.9 % IV SOLN
250.0000 mL | INTRAVENOUS | Status: DC | PRN
  Administered 2013-07-14: 500 mL via INTRAVENOUS

## 2013-07-14 MED ORDER — SODIUM CHLORIDE 0.9 % IJ SOLN
3.0000 mL | INTRAMUSCULAR | Status: DC | PRN
  Administered 2013-07-14: 3 mL via INTRAVENOUS

## 2013-07-14 MED ORDER — ASPIRIN 81 MG PO CHEW
324.0000 mg | CHEWABLE_TABLET | ORAL | Status: AC
  Administered 2013-07-15: 324 mg via ORAL
  Filled 2013-07-14 (×2): qty 4

## 2013-07-14 MED ORDER — ONDANSETRON HCL 4 MG/2ML IJ SOLN: 4.0000 mg | Freq: Four times a day (QID) | INTRAMUSCULAR | Status: DC | PRN

## 2013-07-14 MED ORDER — ASPIRIN EC 81 MG PO TBEC
81.0000 mg | DELAYED_RELEASE_TABLET | Freq: Every day | ORAL | Status: DC
  Administered 2013-07-14 – 2013-07-16 (×3): 81 mg via ORAL
  Filled 2013-07-14 (×3): qty 1

## 2013-07-14 MED ORDER — SODIUM CHLORIDE 0.9 % IJ SOLN: 3.0000 mL | INTRAMUSCULAR | Status: DC | PRN

## 2013-07-14 MED ORDER — OXYCODONE-ACETAMINOPHEN 5-325 MG PO TABS: 1.0000 | ORAL_TABLET | Freq: Three times a day (TID) | ORAL | Status: DC | PRN

## 2013-07-14 MED ORDER — SODIUM CHLORIDE 0.9 % IJ SOLN: 3.0000 mL | Freq: Two times a day (BID) | INTRAMUSCULAR | Status: DC

## 2013-07-14 MED ORDER — AMLODIPINE BESYLATE 5 MG PO TABS
5.0000 mg | ORAL_TABLET | Freq: Every day | ORAL | Status: DC
  Administered 2013-07-15 – 2013-07-16 (×2): 5 mg via ORAL
  Filled 2013-07-14 (×2): qty 1

## 2013-07-14 MED ORDER — ISOSORBIDE MONONITRATE ER 60 MG PO TB24
120.0000 mg | ORAL_TABLET | Freq: Every day | ORAL | Status: DC
  Administered 2013-07-15 – 2013-07-16 (×2): 120 mg via ORAL
  Filled 2013-07-14 (×2): qty 2

## 2013-07-14 MED ORDER — FERROUS SULFATE 325 (65 FE) MG PO TABS
325.0000 mg | ORAL_TABLET | Freq: Every day | ORAL | Status: DC
  Administered 2013-07-15 – 2013-07-16 (×2): 325 mg via ORAL
  Filled 2013-07-14 (×3): qty 1

## 2013-07-14 MED ORDER — MAGNESIUM OXIDE 400 MG PO TABS
400.0000 mg | ORAL_TABLET | Freq: Every day | ORAL | Status: DC
  Administered 2013-07-14 – 2013-07-15 (×2): 400 mg via ORAL
  Filled 2013-07-14 (×3): qty 1

## 2013-07-14 MED ORDER — SODIUM CHLORIDE 0.9 % IJ SOLN
3.0000 mL | Freq: Two times a day (BID) | INTRAMUSCULAR | Status: DC
  Administered 2013-07-14 (×2): 3 mL via INTRAVENOUS

## 2013-07-14 MED ORDER — NITROGLYCERIN 0.4 MG SL SUBL: 0.4000 mg | SUBLINGUAL_TABLET | SUBLINGUAL | Status: DC | PRN

## 2013-07-14 MED ORDER — PARICALCITOL 1 MCG PO CAPS
1.0000 ug | ORAL_CAPSULE | Freq: Every day | ORAL | Status: DC
  Administered 2013-07-14 – 2013-07-15 (×2): 1 ug via ORAL
  Filled 2013-07-14 (×3): qty 1

## 2013-07-14 MED ORDER — SODIUM BICARBONATE BOLUS VIA INFUSION
INTRAVENOUS | Status: AC
  Administered 2013-07-15: 10:00:00 via INTRAVENOUS
  Filled 2013-07-14: qty 1

## 2013-07-14 MED ORDER — SIMVASTATIN 5 MG PO TABS
5.0000 mg | ORAL_TABLET | Freq: Every day | ORAL | Status: DC
  Administered 2013-07-14 – 2013-07-15 (×2): 5 mg via ORAL
  Filled 2013-07-14 (×3): qty 1

## 2013-07-14 MED ORDER — SODIUM BICARBONATE 8.4 % IV SOLN
INTRAVENOUS | Status: AC
  Administered 2013-07-15: 10:00:00 via INTRAVENOUS
  Filled 2013-07-14 (×2): qty 1000

## 2013-07-14 MED ORDER — HEPARIN BOLUS VIA INFUSION
4000.0000 [IU] | Freq: Once | INTRAVENOUS | Status: AC
  Administered 2013-07-14: 4000 [IU] via INTRAVENOUS
  Filled 2013-07-14: qty 4000

## 2013-07-14 MED ORDER — SODIUM CHLORIDE 0.9 % IV SOLN: 250.0000 mL | INTRAVENOUS | Status: DC | PRN

## 2013-07-14 MED ORDER — PNEUMOCOCCAL VAC POLYVALENT 25 MCG/0.5ML IJ INJ
0.5000 mL | INJECTION | INTRAMUSCULAR | Status: DC
  Filled 2013-07-14: qty 0.5

## 2013-07-14 MED ORDER — METOPROLOL TARTRATE 50 MG PO TABS
75.0000 mg | ORAL_TABLET | Freq: Three times a day (TID) | ORAL | Status: DC
  Administered 2013-07-14 – 2013-07-16 (×5): 75 mg via ORAL
  Filled 2013-07-14 (×8): qty 1

## 2013-07-14 NOTE — Progress Notes (Addendum)
ANTICOAGULATION CONSULT NOTE - Initial Consult  Pharmacy Consult for Heparin Indication:  History of Atrial fibrillation  (coumadin on hold for Cardiac Cath)  Allergies  Allergen Reactions  . Amiodarone Hives  . Propylthiouracil Rash    Patient Measurements: Height: 5\' 7"  (170.2 cm) Weight: 210 lb 5.1 oz (95.4 kg) IBW/kg (Calculated) : 61.6 Heparin Dosing Weight: 82.5 kg  Vital Signs: see addendum below    Labs: see addendum below No results found for this basename: HGB, HCT, PLT, APTT, LABPROT, INR, HEPARINUNFRC, CREATININE, CKTOTAL, CKMB, TROPONINI,  in the last 72 hours  Estimated Creatinine Clearance: 16.7 ml/min (by C-G formula based on Cr of 3.4).   Medical History: Past Medical History  Diagnosis Date  . Cardiac pacemaker in situ   . Cellulitis and abscess of unspecified site     a. h/o MSRA cellulitis of abdominal wall.  Marland Kitchen CAD (coronary artery disease)     a. DES to OM 01/2005. b. Rotational atherotomy/DES to prox LAD 02/2008.  . Edema   . Chronic renal insufficiency     Cr ~2.3.  . Warfarin anticoagulation     a. Followed by PCP.  Marland Kitchen GI bleed     a. EGD 01/2007: antral ulcers related to NSAID/aspirin use.  . Polycystic kidney disease     a. Per remote E-Chart records.  . Peripheral vascular disease   . Hyperthyroidism     a. Hx of intolerance to PTU.  Marland Kitchen Blood transfusion     no abnormal reaction  . H/O hiatal hernia   . Umbilical hernia     unrepaired (11/24/11)  . Arthritis   . Valvular heart disease     Echo 11/2011: mild-mod MR.  Marland Kitchen Hypertension     takes Amlodipine,Hydralazine,and Imdur daily  . Hyperlipidemia     takes Lovastatin daily  . Anemia, unspecified     takes an Iron Pill daily  . SSS (sick sinus syndrome)     a. H/o AV node ablation 2008 secondary to difficult to control afib/flutter, with subsequent pacer.  . Atrial fibrillation with rapid ventricular response     a. H/o amiodarone thyroiditis. b. s/p AV node ablation 2008 with  implantation of PPM 01/2007, upgraded to CRT-P 06/2007.;takes Coumadin daily  . GERD (gastroesophageal reflux disease)     takes Protonix daily  . Chronic diastolic CHF (congestive heart failure)     takes Lasix daily and Spironolactone   . Myocardial infarction 1996  . Pacemaker     Medtronic  . Shortness of breath     with exertion or sitting  . Migraines     last migraine has been a while but headaches dail  . Chronic back pain     deteriorating disc  . History of gout   . Bruises easily   . Gastric ulcer     hx of  . Hemorrhoids   . Constipation   . Diarrhea   . History of kidney stones   . Urinary frequency   . Nocturia   . Depression     takes Xanax daily  . Insomnia     takes Xanax if needed    Medications:  Prescriptions prior to admission  Medication Sig Dispense Refill  . ALPRAZolam (XANAX) 0.5 MG tablet Take 0.5 mg by mouth 3 (three) times daily as needed.       Marland Kitchen amLODipine (NORVASC) 5 MG tablet Take 1 tablet (5 mg total) by mouth daily.  30 tablet  11  . aspirin  EC 81 MG tablet Take 81 mg by mouth daily. Does not take daily only when she feels like taking it      . ferrous sulfate 325 (65 FE) MG tablet Take 325 mg by mouth daily with breakfast.       . furosemide (LASIX) 40 MG tablet Take 40 mg by mouth daily.       . hydrALAZINE (APRESOLINE) 25 MG tablet Take 1 tablet (25 mg total) by mouth 3 (three) times daily.  90 tablet  3  . isosorbide mononitrate (IMDUR) 60 MG 24 hr tablet Take 2 tablets (120 mg total) by mouth daily.  60 tablet  3  . lovastatin (MEVACOR) 10 MG tablet Take 10 mg by mouth at bedtime.       . magnesium oxide (MAG-OX) 400 MG tablet Take 400 mg by mouth at bedtime.       . metoprolol tartrate (LOPRESSOR) 25 MG tablet Take 3 tablets (75 mg total) by mouth 3 (three) times daily.  256 tablet  3  . oxyCODONE-acetaminophen (PERCOCET/ROXICET) 5-325 MG per tablet Take 1 tablet by mouth 3 (three) times daily as needed for moderate pain. For pain  30  tablet  0  . pantoprazole (PROTONIX) 40 MG tablet Take 40 mg by mouth 2 (two) times daily.       . paricalcitol (ZEMPLAR) 1 MCG capsule Take 1 mcg by mouth at bedtime.      . potassium chloride (K-DUR,KLOR-CON) 10 MEQ tablet Take 1 tablet (10 mEq total) by mouth daily.  30 tablet  6  . spironolactone (ALDACTONE) 25 MG tablet Take 12.5 mg by mouth daily.      Marland Kitchen warfarin (COUMADIN) 2 MG tablet Take 2-3 mg by mouth daily. Take 3 mg on Monday, and Friday.  Take 2 mg on Tues., Wed., Thurs., Sat., and Sunday      . nitroGLYCERIN (NITROSTAT) 0.4 MG SL tablet Place 0.4 mg under the tongue every 5 (five) minutes as needed for chest pain.        Assessment: 78 y.o female with history of atrial fibrillation who is on chronic coumadin.  She is scheduled to have a cardiac cath tomorrow.  Her coumadin was held 3 days prior to cath per Dr. [name]. No history of stroke thus no bridge with heparin needed per Dr. [name].   She has a h/o CKD stage IV and pharmacy has been asked to dose bicarbonate IV prior to cath for prevention of contrast induced nephropathy.   Goal of Therapy:  Heparin level 0.3-0.7 units/ml Monitor platelets by anticoagulation protocol: Yes   Plan:  1. Awaiting result of admit PT/INR.  Heparin drip to start when INR is <2.0.  Follow up INR.  2. Sodium Bicarbonate IVF to start 1 hour prior to cath procedure at 275 ml/hr then reduce rate to 90 ml/hr to continue during procedure and for 6 hours following the procedure.  (based on weight =95 kg).  Noah Delaine, RPh Clinical Pharmacist Pager: [phone] 07/14/2013,2:29 PM  ADDENDUM: Admit labs as below.  INR = 1.27 thus will start IV heparin infusion for h/o Afib.  Note patient has a h/o GI bleed in 2008 related to NSAID use.  SCr = 3.01.   Preadmit CBC 11/25 => pltc 203K, H/H 11.2/32.9.  No bleeding reported at this time.  Vital Signs: BP: 121/65 mmHg (11/30 1500) Pulse Rate: 64 (11/30 1500)  Labs: 07/09/13  PLTC 203K,  H/H  11.2/32.9  Recent Labs  07/14/13 1455 07/14/13 1530  LABPROT 15.6*  --   INR 1.27  --   CREATININE  --  3.01*    Estimated Creatinine Clearance: 18.9 ml/min (by C-G formula based on Cr of 3.01). Goal: heparin level 0.3-0.7 units/ml Monitor PLTC  Plan:  Heparin bolus 4000 units IV x1 Heparin drip at 1300 units/hr Heparin level 8 hours after heparin started. Daily heparin level and CBC  Noah Delaine, RPh Clinical Pharmacist Pager: [phone] 07/14/2013,4:40 PM

## 2013-07-15 ENCOUNTER — Encounter (HOSPITAL_COMMUNITY)
Admission: AD | Disposition: A | Discharge status: Home / Self Care | Payer: Self-pay | Source: Ambulatory Visit | Attending: Cardiovascular Disease

## 2013-07-15 DIAGNOSIS — K219 Gastro-esophageal reflux disease without esophagitis: Secondary | ICD-10-CM | POA: Diagnosis not present

## 2013-07-15 DIAGNOSIS — I4891 Unspecified atrial fibrillation: Secondary | ICD-10-CM

## 2013-07-15 DIAGNOSIS — R079 Chest pain, unspecified: Secondary | ICD-10-CM | POA: Diagnosis not present

## 2013-07-15 DIAGNOSIS — N189 Chronic kidney disease, unspecified: Secondary | ICD-10-CM

## 2013-07-15 DIAGNOSIS — I2510 Atherosclerotic heart disease of native coronary artery without angina pectoris: Secondary | ICD-10-CM

## 2013-07-15 HISTORY — PX: LEFT HEART CATHETERIZATION WITH CORONARY ANGIOGRAM: SHX5451

## 2013-07-15 LAB — BASIC METABOLIC PANEL
BUN: 38 mg/dL — ABNORMAL HIGH (ref 6–23)
Calcium: 8.8 mg/dL (ref 8.4–10.5)
Chloride: 96 mEq/L (ref 96–112)
GFR calc Af Amer: 17 mL/min — ABNORMAL LOW (ref >90)
GFR calc non Af Amer: 15 mL/min — ABNORMAL LOW (ref >90)
Potassium: 3.5 mEq/L (ref 3.5–5.1)
Sodium: 131 mEq/L — ABNORMAL LOW (ref 135–145)

## 2013-07-15 LAB — POCT ACTIVATED CLOTTING TIME: Activated Clotting Time: 176 seconds

## 2013-07-15 LAB — CBC
HCT: 28.8 % — ABNORMAL LOW (ref 36.0–46.0)
Hemoglobin: 10 g/dL — ABNORMAL LOW (ref 12.0–15.0)
MCHC: 34.7 g/dL (ref 30.0–36.0)
Platelets: 158 10*3/uL (ref 150–400)
RBC: 3.31 MIL/uL — ABNORMAL LOW (ref 3.87–5.11)
RDW: 14.8 % (ref 11.5–15.5)
WBC: 4.5 10*3/uL (ref 4.0–10.5)

## 2013-07-15 SURGERY — LEFT HEART CATHETERIZATION WITH CORONARY ANGIOGRAM
Anesthesia: LOCAL

## 2013-07-15 MED ORDER — WARFARIN SODIUM 2 MG PO TABS: 2.0000 mg | ORAL_TABLET | ORAL | Status: DC

## 2013-07-15 MED ORDER — HEPARIN (PORCINE) IN NACL 2-0.9 UNIT/ML-% IJ SOLN
INTRAMUSCULAR | Status: AC
  Filled 2013-07-15: qty 1500

## 2013-07-15 MED ORDER — WARFARIN SODIUM 2 MG PO TABS: 2.0000 mg | ORAL_TABLET | Freq: Every day | ORAL | Status: DC

## 2013-07-15 MED ORDER — LOPERAMIDE HCL 2 MG PO CAPS
2.0000 mg | ORAL_CAPSULE | ORAL | Status: DC | PRN
  Administered 2013-07-15: 2 mg via ORAL
  Filled 2013-07-15: qty 1

## 2013-07-15 MED ORDER — WARFARIN - PHYSICIAN DOSING INPATIENT: Freq: Every day | Status: DC

## 2013-07-15 MED ORDER — MIDAZOLAM HCL 2 MG/2ML IJ SOLN
INTRAMUSCULAR | Status: AC
  Filled 2013-07-15: qty 2

## 2013-07-15 MED ORDER — LIDOCAINE HCL (PF) 1 % IJ SOLN
INTRAMUSCULAR | Status: AC
  Filled 2013-07-15: qty 30

## 2013-07-15 MED ORDER — FENTANYL CITRATE 0.05 MG/ML IJ SOLN
INTRAMUSCULAR | Status: AC
  Filled 2013-07-15: qty 2

## 2013-07-15 MED ORDER — WARFARIN - PHARMACIST DOSING INPATIENT
Freq: Every day | Status: DC
  Administered 2013-07-15: 19:00:00

## 2013-07-15 MED ORDER — WARFARIN SODIUM 3 MG PO TABS
3.0000 mg | ORAL_TABLET | Freq: Once | ORAL | Status: AC
  Administered 2013-07-15: 3 mg via ORAL
  Filled 2013-07-15: qty 1

## 2013-07-15 MED ORDER — FUROSEMIDE 40 MG PO TABS
40.0000 mg | ORAL_TABLET | Freq: Every day | ORAL | Status: DC
  Administered 2013-07-15 – 2013-07-16 (×2): 40 mg via ORAL
  Filled 2013-07-15 (×2): qty 1

## 2013-07-15 MED ORDER — WARFARIN SODIUM 3 MG PO TABS
3.0000 mg | ORAL_TABLET | ORAL | Status: DC
  Filled 2013-07-15: qty 1

## 2013-07-15 MED ORDER — PNEUMOCOCCAL VAC POLYVALENT 25 MCG/0.5ML IJ INJ
0.5000 mL | INJECTION | INTRAMUSCULAR | Status: AC | PRN
  Administered 2013-07-15: 22:00:00 0.5 mL via INTRAMUSCULAR
  Filled 2013-07-15: qty 0.5

## 2013-07-15 MED ORDER — SODIUM CHLORIDE 0.9 % IV SOLN: 1.0000 mL/kg/h | INTRAVENOUS | Status: DC

## 2013-07-15 MED ORDER — NITROGLYCERIN 0.2 MG/ML ON CALL CATH LAB
INTRAVENOUS | Status: AC
  Filled 2013-07-15: qty 1

## 2013-07-15 NOTE — Interval H&P Note (Signed)
History and Physical Interval Note:  07/15/2013 4:07 PM  [facility] KAE LAUMAN  has presented today for surgery, with the diagnosis of Chest pain  The various methods of treatment have been discussed with the patient and family. After consideration of risks, benefits and other options for treatment, the patient has consented to  Procedure(s): LEFT HEART CATHETERIZATION WITH CORONARY ANGIOGRAM (N/A) as a surgical intervention .  The patient's history has been reviewed, patient examined, no change in status, stable for surgery.  I have reviewed the patient's chart and labs.  Questions were answered to the patient's satisfaction.     Cath Lab Visit (complete for each Cath Lab visit)  Clinical Evaluation Leading to the Procedure:   ACS: no  Non-ACS:    Anginal Classification: CCS III  Anti-ischemic medical therapy: Maximal Therapy (2 or more classes of medications)  Non-Invasive Test Results: No non-invasive testing performed  Prior CABG: No previous CABG       Tonny Bollman

## 2013-07-15 NOTE — H&P (View-Only) (Signed)
Patient ID: [name], female   DOB: 12/29/1934, 78 y.o.   MRN: [id number] Subjective:  Had some chest pain earlier this morning. Minimal dyspnea. Awaiting catheterization  Objective:  Vital Signs in the last 24 hours: Temp:  [97.7 F (36.5 C)-98.7 F (37.1 C)] 97.8 F (36.6 C) (12/01 0756) Pulse Rate:  [58-90] 60 (12/01 0756) Resp:  [12-25] 19 (12/01 0756) BP: (112-153)/(55-87) 140/65 mmHg (12/01 0756) SpO2:  [88 %-100 %] 98 % (12/01 0756) Weight:  [210 lb 5.1 oz (95.4 kg)] 210 lb 5.1 oz (95.4 kg) (12/01 0500)  Intake/Output from previous day: 11/30 0701 - 12/01 0700 In: 258.9 [P.O.:240; I.V.:18.9] Out: -  Intake/Output from this shift:    Physical Exam: Well appearing obese, elderly woman, NAD HEENT: Unremarkable Neck:  No JVD, no thyromegally Back:  No CVA tenderness Lungs:  Clear with no wheezes HEART:  Regular rate rhythm, no murmurs, no rubs, no clicks Abd:  soft, obese, positive bowel sounds, no organomegally, no rebound, no guarding Ext:  2 plus pulses, no edema, no cyanosis, no clubbing Skin:  No rashes no nodules Neuro:  CN II through XII intact, motor grossly intact  Lab Results:  Recent Labs  07/15/13 0240  WBC 4.5  HGB 10.0*  PLT 158    Recent Labs  07/14/13 1530 07/15/13 0240  NA 129* 131*  K 4.3 3.5  CL 95* 96  CO2 22 22  GLUCOSE 97 99  BUN 38* 38*  CREATININE 3.01* 2.84*   No results found for this basename: TROPONINI, CK, MB,  in the last 72 hours Hepatic Function Panel No results found for this basename: PROT, ALBUMIN, AST, ALT, ALKPHOS, BILITOT, BILIDIR, IBILI,  in the last 72 hours No results found for this basename: CHOL,  in the last 72 hours No results found for this basename: PROTIME,  in the last 72 hours  Imaging: No results found.  Cardiac Studies: Tele - atrial fib with a controlled VR Assessment/Plan:  1. Crescendo angina - she has been admitted for IV hydration and renal function slightly improved. Await  catheterization later today. 2. Atrial fib - her ventricular rate is well controlled. Continue current medical therapy 3. Chronic renal insufficiency - she is s/p placement of a right arm fistula. If renal failure develops after heart cath, can proceed with dialysis.  LOS: 1 day    Gregg Taylor,M.D. 07/15/2013, 8:24 AM

## 2013-07-15 NOTE — Progress Notes (Signed)
Patient ID: [name], female   DOB: 10/18/1934, 78 y.o.   MRN: [id number] Subjective:  Had some chest pain earlier this morning. Minimal dyspnea. Awaiting catheterization  Objective:  Vital Signs in the last 24 hours: Temp:  [97.7 F (36.5 C)-98.7 F (37.1 C)] 97.8 F (36.6 C) (12/01 0756) Pulse Rate:  [58-90] 60 (12/01 0756) Resp:  [12-25] 19 (12/01 0756) BP: (112-153)/(55-87) 140/65 mmHg (12/01 0756) SpO2:  [88 %-100 %] 98 % (12/01 0756) Weight:  [210 lb 5.1 oz (95.4 kg)] 210 lb 5.1 oz (95.4 kg) (12/01 0500)  Intake/Output from previous day: 11/30 0701 - 12/01 0700 In: 258.9 [P.O.:240; I.V.:18.9] Out: -  Intake/Output from this shift:    Physical Exam: Well appearing obese, elderly woman, NAD HEENT: Unremarkable Neck:  No JVD, no thyromegally Back:  No CVA tenderness Lungs:  Clear with no wheezes HEART:  Regular rate rhythm, no murmurs, no rubs, no clicks Abd:  soft, obese, positive bowel sounds, no organomegally, no rebound, no guarding Ext:  2 plus pulses, no edema, no cyanosis, no clubbing Skin:  No rashes no nodules Neuro:  CN II through XII intact, motor grossly intact  Lab Results:  Recent Labs  07/15/13 0240  WBC 4.5  HGB 10.0*  PLT 158    Recent Labs  07/14/13 1530 07/15/13 0240  NA 129* 131*  K 4.3 3.5  CL 95* 96  CO2 22 22  GLUCOSE 97 99  BUN 38* 38*  CREATININE 3.01* 2.84*   No results found for this basename: TROPONINI, CK, MB,  in the last 72 hours Hepatic Function Panel No results found for this basename: PROT, ALBUMIN, AST, ALT, ALKPHOS, BILITOT, BILIDIR, IBILI,  in the last 72 hours No results found for this basename: CHOL,  in the last 72 hours No results found for this basename: PROTIME,  in the last 72 hours  Imaging: No results found.  Cardiac Studies: Tele - atrial fib with a controlled VR Assessment/Plan:  1. Crescendo angina - she has been admitted for IV hydration and renal function slightly improved. Await  catheterization later today. 2. Atrial fib - her ventricular rate is well controlled. Continue current medical therapy 3. Chronic renal insufficiency - she is s/p placement of a right arm fistula. If renal failure develops after heart cath, can proceed with dialysis.  LOS: 1 day    Gregg Taylor,M.D. 07/15/2013, 8:24 AM    

## 2013-07-15 NOTE — Progress Notes (Addendum)
ANTICOAGULATION CONSULT NOTE   Pharmacy Consult for Coumadin Indication: atrial fibrillation  Allergies  Allergen Reactions  . Amiodarone Hives  . Propylthiouracil Rash    Patient Measurements: Height: 5\' 7"  (170.2 cm) Weight: 210 lb 5.1 oz (95.4 kg) IBW/kg (Calculated) : 61.6 Heparin Dosing Weight: n/a  Vital Signs: Temp: 98 F (36.7 C) (12/01 1134) Temp src: Oral (12/01 1134) BP: 127/88 mmHg (12/01 1134) Pulse Rate: 61 (12/01 1559)  Labs:  Recent Labs  07/14/13 1455 07/14/13 1530 07/15/13 0240  HGB  --   --  10.0*  HCT  --   --  28.8*  PLT  --   --  158  LABPROT 15.6*  --   --   INR 1.27  --   --   HEPARINUNFRC  --   --  0.85*  CREATININE  --  3.01* 2.84*    Estimated Creatinine Clearance: 20 ml/min (by C-G formula based on Cr of 2.84).   Medical History: Past Medical History  Diagnosis Date  . Cardiac pacemaker in situ   . Cellulitis and abscess of unspecified site     a. h/o MSRA cellulitis of abdominal wall.  Marland Kitchen CAD (coronary artery disease)     a. DES to OM 01/2005. b. Rotational atherotomy/DES to prox LAD 02/2008.  . Edema   . Chronic renal insufficiency     Cr ~2.3.  . Warfarin anticoagulation     a. Followed by PCP.  Marland Kitchen GI bleed     a. EGD 01/2007: antral ulcers related to NSAID/aspirin use.  . Polycystic kidney disease     a. Per remote E-Chart records.  . Peripheral vascular disease   . Hyperthyroidism     a. Hx of intolerance to PTU.  Marland Kitchen Blood transfusion     no abnormal reaction  . H/O hiatal hernia   . Umbilical hernia     unrepaired (11/24/11)  . Arthritis   . Valvular heart disease     Echo 11/2011: mild-mod MR.  Marland Kitchen Hypertension     takes Amlodipine,Hydralazine,and Imdur daily  . Hyperlipidemia     takes Lovastatin daily  . Anemia, unspecified     takes an Iron Pill daily  . SSS (sick sinus syndrome)     a. H/o AV node ablation 2008 secondary to difficult to control afib/flutter, with subsequent pacer.  . Atrial fibrillation with  rapid ventricular response     a. H/o amiodarone thyroiditis. b. s/p AV node ablation 2008 with implantation of PPM 01/2007, upgraded to CRT-P 06/2007.;takes Coumadin daily  . GERD (gastroesophageal reflux disease)     takes Protonix daily  . Chronic diastolic CHF (congestive heart failure)     takes Lasix daily and Spironolactone   . Myocardial infarction 1996  . Pacemaker     Medtronic  . Shortness of breath     with exertion or sitting  . Migraines     last migraine has been a while but headaches dail  . Chronic back pain     deteriorating disc  . History of gout   . Bruises easily   . Gastric ulcer     hx of  . Hemorrhoids   . Constipation   . Diarrhea   . History of kidney stones   . Urinary frequency   . Nocturia   . Depression     takes Xanax daily  . Insomnia     takes Xanax if needed  . Heart murmur     Medications:  Scheduled:  . amLODipine  5 mg Oral Daily  . aspirin EC  81 mg Oral Daily  . ferrous sulfate  325 mg Oral Q breakfast  . furosemide  40 mg Oral Daily  . hydrALAZINE  25 mg Oral TID  . isosorbide mononitrate  120 mg Oral Daily  . magnesium oxide  400 mg Oral QHS  . metoprolol tartrate  75 mg Oral TID  . pantoprazole  40 mg Oral BID  . paricalcitol  1 mcg Oral QHS  . simvastatin  5 mg Oral q1800    Assessment: 78 yo female with hx CAD, afib on chronic Coumadin, CHF, mitral regurg, CKD IV, hx GIB 2008. Admitted with chest pain. Started on IV heparin, then taken to cath lab 12/1 - found with diffuse non-obstructive CAD, planning for medical therapy. Pharmacy asked to resume Coumadin.  Last Coumadin  dose at home taken [facility] 11/26. Takes 2.5 mg Mon, Fri and 2 mg all other days. INR 11/25 was 2.2, on 11/30 was down to 1.27.   Goal of Therapy:  INR 2-3 Monitor platelets by anticoagulation protocol: Yes   Plan:  1. Restart Coumadin with 3 mg po x 1 tonight. 2. Daily INR for now.  Tad Moore, BCPS  Clinical Pharmacist Pager [phone]  07/15/2013 5:54 PM

## 2013-07-15 NOTE — CV Procedure (Signed)
   Cardiac Catheterization Procedure Note  Name: [name] MRN: [id number] DOB: Feb 15, 1935  Procedure: Left Heart Cath, Selective Coronary Angiography  Indication: CCS Class 3 angina, max med Rx, known CAD. This 78 year old woman with stage IV chronic kidney disease he has had progressive angina and exertional dyspnea. She was admitted for IV hydration prior to cardiac catheterization.  Procedural details: The right groin was prepped, draped, and anesthetized with 1% lidocaine. Using modified Seldinger technique, a 5 French sheath was introduced into the right femoral artery. Standard Judkins catheters were used for coronary angiography and left ventriculography. Catheter exchanges were performed over a guidewire. There were no immediate procedural complications. The patient was transferred to the post catheterization recovery area for further monitoring.  Procedural Findings: Hemodynamics:  AO 134/65  LV 130/21   Coronary angiography: Coronary dominance: right  Left mainstem: The left main stem is calcified with no obstructive disease. It arises from the left cusp it divides into the LAD and left circumflex.  Left anterior descending (LAD): The LAD has diffuse irregularity without high-grade stenosis. The stented segment in the proximal LAD is patent without significant stenosis. Beyond the stented segment there is a 30-40% stenosis. The diagonal branches are patent without significant disease. The first septal perforator has 80% ostial stenosis.  Left circumflex (LCx): The circumflex is patent throughout its course. The first obtuse marginal has a stent in its proximal portion with no significant obstruction. There is 20% diffuse in-stent restenosis. The AV circumflex is patent with mild calcification and 20-30% stenosis.  Right coronary artery (RCA): The right coronary artery is dominant. The vessel has mild nonobstructive stenosis in the proximal and midportion of no more than  30-40%. There is moderate calcification present. The PDA and PLA branches are widely patent.  Left ventriculography: Deferred because of advanced kidney disease.  Final Conclusions:   1. Diffuse nonobstructive coronary artery disease with continued patency of the stented segments in the proximal LAD and first obtuse marginal branches  2. Mildly elevated left ventricular end-diastolic pressure  Recommendations: Continued medical therapy. The patient will complete 6 hours of postprocedure sodium bicarbonate infusion as per protocol. Repeat metabolic panel in the morning and anticipate discharge tomorrow.  Total contrast administered: 17 cc  Tonny Bollman 07/15/2013, 4:39 PM

## 2013-07-15 NOTE — Progress Notes (Signed)
ANTICOAGULATION CONSULT NOTE - Follow Up Consult  Pharmacy Consult for Heparin  Indication: atrial fibrillation, warfarin on hold for cath  Allergies  Allergen Reactions  . Amiodarone Hives  . Propylthiouracil Rash    Patient Measurements: Height: 5\' 7"  (170.2 cm) Weight: 210 lb 5.1 oz (95.4 kg) IBW/kg (Calculated) : 61.6 Heparin Dosing Weight: 82.5   Vital Signs: Temp: 98.2 F (36.8 C) (12/01 0300) Temp src: Oral (12/01 0300) BP: 133/75 mmHg (12/01 0300) Pulse Rate: 64 (12/01 0300)  Labs:  Recent Labs  07/14/13 1455 07/14/13 1530 07/15/13 0240  HGB  --   --  10.0*  HCT  --   --  28.8*  PLT  --   --  158  LABPROT 15.6*  --   --   INR 1.27  --   --   HEPARINUNFRC  --   --  0.85*  CREATININE  --  3.01* 2.84*    Estimated Creatinine Clearance: 20 ml/min (by C-G formula based on Cr of 2.84).   Medications:  Heparin 1300 units/hr  Assessment: 78 y/o on heparin while warfarin on hold for cath today. HL 0.85. No issues per RN.   Goal of Therapy:  Heparin level 0.3-0.7 units/ml Monitor platelets by anticoagulation protocol: Yes   Plan:  -Decrease heparin drip to 1100 units/hr  -HL at 1300 if not in cath  -Daily CBC/HL, pending cath plans -Monitor for bleeding  Thank you for allowing me to take part in this patient's care,  Abran Duke, PharmD Clinical Pharmacist Phone: [phone] Pager: [phone] 07/15/2013 4:27 AM

## 2013-07-15 NOTE — Progress Notes (Signed)
Chaplain to visit patient. Offered spiritual care. Patient declined.  Chaplain H. Capers Hagmann 

## 2013-07-16 ENCOUNTER — Encounter (HOSPITAL_COMMUNITY): Payer: Self-pay | Admitting: Physician Assistant

## 2013-07-16 ENCOUNTER — Other Ambulatory Visit: Payer: Self-pay | Admitting: Physician Assistant

## 2013-07-16 DIAGNOSIS — N189 Chronic kidney disease, unspecified: Secondary | ICD-10-CM

## 2013-07-16 LAB — PROTIME-INR: INR: 1.12 (ref 0.00–1.49)

## 2013-07-16 LAB — BASIC METABOLIC PANEL
BUN: 35 mg/dL — ABNORMAL HIGH (ref 6–23)
CO2: 25 mEq/L (ref 19–32)
Calcium: 9.3 mg/dL (ref 8.4–10.5)
Creatinine, Ser: 2.66 mg/dL — ABNORMAL HIGH (ref 0.50–1.10)
GFR calc non Af Amer: 16 mL/min — ABNORMAL LOW (ref >90)
Glucose, Bld: 104 mg/dL — ABNORMAL HIGH (ref 70–99)
Sodium: 134 mEq/L — ABNORMAL LOW (ref 135–145)

## 2013-07-16 MED ORDER — WARFARIN SODIUM 3 MG PO TABS
3.0000 mg | ORAL_TABLET | Freq: Once | ORAL | Status: DC
  Filled 2013-07-16: qty 1

## 2013-07-16 MED ORDER — WARFARIN SODIUM 2 MG PO TABS: ORAL_TABLET | ORAL | Status: DC

## 2013-07-16 NOTE — Progress Notes (Signed)
ANTICOAGULATION CONSULT NOTE - Follow Up Consult  Pharmacy Consult for coumadin Indication: atrial fibrillation  Allergies  Allergen Reactions  . Amiodarone Hives  . Propylthiouracil Rash    Patient Measurements: Height: 5\' 7"  (170.2 cm) Weight: 210 lb 12.2 oz (95.6 kg) IBW/kg (Calculated) : 61.6 Heparin Dosing Weight:   Vital Signs: Temp: 98.1 F (36.7 C) (12/02 0745) Temp src: Oral (12/02 0745) BP: 135/73 mmHg (12/02 0745) Pulse Rate: 71 (12/02 0745)  Labs:  Recent Labs  07/14/13 1455 07/14/13 1530 07/15/13 0240 07/16/13 0708  HGB  --   --  10.0*  --   HCT  --   --  28.8*  --   PLT  --   --  158  --   LABPROT 15.6*  --   --   --   INR 1.27  --   --   --   HEPARINUNFRC  --   --  0.85*  --   CREATININE  --  3.01* 2.84* 2.66*    Estimated Creatinine Clearance: 21.4 ml/min (by C-G formula based on Cr of 2.66).   Medications:  Scheduled:  . amLODipine  5 mg Oral Daily  . aspirin EC  81 mg Oral Daily  . ferrous sulfate  325 mg Oral Q breakfast  . furosemide  40 mg Oral Daily  . hydrALAZINE  25 mg Oral TID  . isosorbide mononitrate  120 mg Oral Daily  . magnesium oxide  400 mg Oral QHS  . metoprolol tartrate  75 mg Oral TID  . pantoprazole  40 mg Oral BID  . paricalcitol  1 mcg Oral QHS  . simvastatin  5 mg Oral q1800  . Warfarin - Pharmacist Dosing Inpatient   Does not apply q1800   Infusions:    Assessment: 78 yo female s/p cath is currently on subtherapeutic coumadin for afib.  INR today is 1.12.  Goal of Therapy:  INR 2-3 Monitor platelets by anticoagulation protocol: Yes   Plan:  1) Coumadin 3 mg po x1 2) INR in am if not discharged  Verlia Kaney, Tsz-Yin 07/16/2013,8:23 AM

## 2013-07-16 NOTE — Progress Notes (Signed)
Patient ID: [name], female   DOB: Feb 24, 1935, 78 y.o.   MRN: [id number] Subjective:  No chest pain this morning  Objective:  Vital Signs in the last 24 hours: Temp:  [97.5 F (36.4 C)-98.3 F (36.8 C)] 98.1 F (36.7 C) (12/02 0745) Pulse Rate:  [50-72] 71 (12/02 0745) Resp:  [15-18] 18 (12/02 0745) BP: (117-147)/(53-73) 135/73 mmHg (12/02 0745) SpO2:  [94 %-100 %] 98 % (12/02 0745) Weight:  [210 lb 12.2 oz (95.6 kg)] 210 lb 12.2 oz (95.6 kg) (12/02 0038)  Intake/Output from previous day: 12/01 0701 - 12/02 0700 In: 1897 [P.O.:700; I.V.:1197] Out: 2350 [Urine:2350] Intake/Output from this shift: Total I/O In: 480 [P.O.:480] Out: -   Physical Exam: Well appearing obese woman, NAD HEENT: Unremarkable Neck:  No JVD, no thyromegally Back:  No CVA tenderness Lungs:  Clear with no wheezes HEART:  Regular rate rhythm, no murmurs, no rubs, no clicks Abd:  soft, positive bowel sounds, no organomegally, no rebound, no guarding Ext:  2 plus pulses, no edema, no cyanosis, no clubbing Skin:  No rashes no nodules Neuro:  CN II through XII intact, motor grossly intact  Lab Results:  Recent Labs  07/15/13 0240  WBC 4.5  HGB 10.0*  PLT 158    Recent Labs  07/15/13 0240 07/16/13 0708  NA 131* 134*  K 3.5 4.9  CL 96 97  CO2 22 25  GLUCOSE 99 104*  BUN 38* 35*  CREATININE 2.84* 2.66*   No results found for this basename: TROPONINI, CK, MB,  in the last 72 hours Hepatic Function Panel No results found for this basename: PROT, ALBUMIN, AST, ALT, ALKPHOS, BILITOT, BILIDIR, IBILI,  in the last 72 hours No results found for this basename: CHOL,  in the last 72 hours No results found for this basename: PROTIME,  in the last 72 hours  Imaging: No results found.  Cardiac Studies: Tele - atrial fib with ventricular pacing Assessment/Plan:  1. Chest pain 2. Known CAD 3. Catheterization which demonstrated no obstructive disease 4. Likely GI cause of pain, she will  need discharge on GI acid suppression meds. 5. Disposition - ok to discharge home. Continue home meds and add protonix 40 mg daily. Followup with Dr. [name].  LOS: 2 days    Lewayne Bunting 07/16/2013, 12:10 PM

## 2013-07-16 NOTE — Discharge Summary (Signed)
Discharge Summary   Patient ID: [name] MRN: [id number], DOB/AGE: 76-Aug-1936 76 y.o. Admit date: 07/14/2013 D/C date:     07/16/2013  Primary Care Provider: [name], MD Primary Cardiologist: [name]  Primary Discharge Diagnoses:  1. Chest pain, felt GI in nature 2. Coronary artery disease - diffuse nonobstructive disease with continued patency of the stented segments in the proximal LAD and first obtuse marginal branches, for medical therapy - prior history: DES to the OM in 6/06, rotational atherectomy + DES to pLAD in 7/09 3. CKD stage IV  Secondary Discharge Diagnoses:  1. Symptomatic bradycardia status post pacemaker with upgrade to CRT-P 11/08 2. Atrial fibrillation with prior AV node ablation (prior amiodarone thyroiditis - H/o AV node ablation 2008 secondary to difficult to control afib/flutter, with subsequent pacer), on Coumadin 3. Chronic diastolic CHF 4. Mitral regurgitation 5. HTN 6. HL 7. GERD 8. Prior upper GI bleed in 2008 antral ulcers related to NSAID/aspirin use 9. PVD 10. Cellulitis and abscess of unspecified site - h/o MSRA cellulitis of abdominal wall 11. Edema 12. Polycystic kidney disease Per remote E-Chart records 13. Hyperthyroidism Hx of intolerance to PTU.   14. H/O hiatal hernia   15. Umbilical hernia unrepaired (11/24/11)   16. Arthritis   17. Valvular heart disease Echo 11/2011: mild-mod MR.   18. Anemia, unspecified takes an Iron Pill daily  19. GERD (gastroesophageal reflux disease)  20. Pacemaker Medtronic  21. Migraines last migraine has been a while but headaches daily 22. Chronic back pain deteriorating disc   23. History of gout   24. Bruises easily  25. Hemorrhoids   26. History of kidney stones   27. Depression takes Xanax daily   28. Insomnia takes Xanax if needed   Hospital Course: [name] is a 78 y/o F with history of CAD, s/p DES to the OM in 6/06, rotational atherectomy + DES to pLAD in 7/09, symptomatic  bradycardia status post pacemaker with upgrade to CRT-P 11/08, atrial fibrillation with prior AV node ablation (prior amiodarone thyroiditis), chronic diastolic CHF, mitral regurgitation, chronic kidney disease stage IV, HTN, HL, GERD, prior upper GI bleed in 2008 related to NSAID use, PVD. She has been followed closely with chest pain recently. [name] has been trying to avoid cardiac catheterization due to chronic renal failure. [name] saw her last week with worsening chest pain and dyspnea with exertion over the last few weeks. She was taking NTG several times a day with relief of her symptoms. Given her high risk for contrast-induced nephropathy and inadequate access for hemodialysis, initially medications were adjusted. She does have recent placement of a R arm fistula. Her beta blocker, nitrates, and diuretics were adjusted. However, she continued to have chest pain with minimal exertion thus was admitted for unstable angina on 07/14/2013. Coumadin was held in prep for the case and she was hydrated. Sodium bicarbonate drip was also used per protocol. She underwent cath yesterday which demonstrated: 1. Diffuse nonobstructive coronary artery disease with continued patency of the stented segments in the proximal LAD and first obtuse marginal branches  2. Mildly elevated left ventricular end-diastolic pressure  Continued medical therapy was recommended. Cr remained stable (was 2.84 pre-cath, 2.66 post-cath). [name] felt that her pain was most likely GI in nature and she will continue BID PPI per home regimen and was instructed to f/u PCP for further evaluation. Coumadin will be restarted. This is followed by her primary doctor and we have asked her to have an  INR check later this week. [name] has placed a call to [name] office to ensure she has followup with their office. Of note, potassium supplement and spironolactone were held from admission on. K today is 4.9. We will check a BMET  in our office in 1 week to reassess this and Cr.  Discharge Vitals: Blood pressure 135/73, pulse 71, temperature 98.1 F (36.7 C), temperature source Oral, resp. rate 18, height 5\' 7"  (1.702 m), weight 210 lb 12.2 oz (95.6 kg), SpO2 98.00%.  Labs: Lab Results  Component Value Date   WBC 4.5 07/15/2013   HGB 10.0* 07/15/2013   HCT 28.8* 07/15/2013   MCV 87.0 07/15/2013   PLT 158 07/15/2013     Recent Labs Lab 07/16/13 0708  NA 134*  K 4.9  CL 97  CO2 25  BUN 35*  CREATININE 2.66*  CALCIUM 9.3  GLUCOSE 104*    Diagnostic Studies/Procedures   Cardiac catheterization this admission, please see full report and above for summary.  Dg Chest 2 View 07/09/2013   CLINICAL DATA:  Chronic diastolic heart failure, coronary artery disease, pre catheterization, history hypertension, atrial fibrillation, MI, chronic renal insufficiency  EXAM: CHEST  2 VIEW  COMPARISON:  06/18/2013  FINDINGS: Left subclavian transvenous pacemaker leads project over right atrium, right ventricle and coronary sinus.  Enlargement of cardiac silhouette.  Tortuous aorta.  Pulmonary vascularity normal.  Mild chronic elevation of right diaphragm.  Lungs clear.  No pleural effusion or pneumothorax.  IMPRESSION: Minimal enlargement of cardiac silhouette post pacemaker.  No acute abnormalities.   Electronically Signed   By: Ulyses Southward M.D.   On: 07/09/2013 16:57   Discharge Medications     Medication List    STOP taking these medications       potassium chloride 10 MEQ tablet  Commonly known as:  K-DUR,KLOR-CON     spironolactone 25 MG tablet  Commonly known as:  ALDACTONE      TAKE these medications       ALPRAZolam 0.5 MG tablet  Commonly known as:  XANAX  Take 0.5 mg by mouth 3 (three) times daily as needed.     amLODipine 5 MG tablet  Commonly known as:  NORVASC  Take 1 tablet (5 mg total) by mouth daily.     aspirin EC 81 MG tablet  Take 81 mg by mouth daily.     ferrous sulfate 325 (65 FE) MG  tablet  Take 325 mg by mouth daily with breakfast.     furosemide 40 MG tablet  Commonly known as:  LASIX  Take 40 mg by mouth daily.     hydrALAZINE 25 MG tablet  Commonly known as:  APRESOLINE  Take 1 tablet (25 mg total) by mouth 3 (three) times daily.     isosorbide mononitrate 60 MG 24 hr tablet  Commonly known as:  IMDUR  Take 2 tablets (120 mg total) by mouth daily.     lovastatin 10 MG tablet  Commonly known as:  MEVACOR  Take 10 mg by mouth at bedtime.     magnesium oxide 400 MG tablet  Commonly known as:  MAG-OX  Take 400 mg by mouth at bedtime.     metoprolol tartrate 25 MG tablet  Commonly known as:  LOPRESSOR  Take 3 tablets (75 mg total) by mouth 3 (three) times daily.     nitroGLYCERIN 0.4 MG SL tablet  Commonly known as:  NITROSTAT  Place 0.4 mg under the tongue every  5 (five) minutes as needed for chest pain.     oxyCODONE-acetaminophen 5-325 MG per tablet  Commonly known as:  PERCOCET/ROXICET  Take 1 tablet by mouth 3 (three) times daily as needed for moderate pain. For pain     paricalcitol 1 MCG capsule  Commonly known as:  ZEMPLAR  Take 1 mcg by mouth at bedtime.     PROTONIX 40 MG tablet  Generic drug:  pantoprazole  Take 40 mg by mouth 2 (two) times daily.     warfarin 2 MG tablet  Commonly known as:  COUMADIN  Take 1.5 tablets (3mg ) by mouth today. Tomorrow, go back to regular schedule of 1 tablet (2mg ) on Tuesday, Wednesday, Thursday, Saturday, Sunday, and 1.5 tablets (3mg ) on Monday and Friday.        Disposition   The patient will be discharged in stable condition to home. Discharge Orders   Future Appointments Provider Department Dept Phone   07/22/2013 1:45 PM [facility] [place] Office [phone]   07/24/2013 3:30 PM Wendall Stade, MD [facility] [place] Office [phone]   07/24/2013 4:00 PM [facility] [place] Office [phone]   08/12/2013 10:30 AM [facility] [place]  CARDIOVASCULAR [address] [phone]   08/12/2013 11:30 AM Nada Libman, MD [facility] [phone]   Future Orders Complete By Expires   Diet - low sodium heart healthy  As directed    Discharge instructions  As directed    Comments:     Please follow up with your primary care doctor to evaluate non-heart causes of your chest pain. You may need to see a stomach doctor to find out if you are having gastrointestinal problems causing your pain.   Increase activity slowly  As directed    Comments:     No driving for 2 days. No lifting over 5 lbs for 1 week. No sexual activity for 1 week. Keep procedure site clean & dry. If you notice increased pain, swelling, bleeding or pus, call/return!  You may shower, but no soaking baths/hot tubs/pools for 1 week.     Follow-up Information   Follow up with Alexandria Puffer, MD. (Call your primary care doctor today to have INR (Coumadin level) checked on Thursday 07/18/13 or Friday 07/19/13 )    Specialty:  Family Medicine   Contact information:   [address] [place] [address] [phone]       Follow up with [facility]. (Labwork only Monday 07/22/13 at 1:45pm to recheck your kidney function and electrolytes)    Specialty:  Cardiology   Contact information:   [address], [address] [place] [address] [phone]      Follow up with Charlton Haws, MD. (07/24/13 at 3:30pm)    Specialty:  Cardiology   Contact information:   [address] [place] [address] [phone]       Follow up with Irena Cords, MD. (as scheduled)    Specialty:  Nephrology   Contact information:   [address] [facility] [place] [address] [phone]         Duration of Discharge Encounter: Greater than 30 minutes including physician and PA time.  Signed, Kriste Basque Dunn PA-C 07/16/2013, 1:09 PM

## 2013-07-16 NOTE — Progress Notes (Signed)
Call received from [facility] stating tele shows pacer spikes on PVC's, Dr Terressa Koyanagi notified, EKG done for MD to view in Epic, no further orders received.

## 2013-07-18 ENCOUNTER — Emergency Department (HOSPITAL_COMMUNITY): Payer: Medicare Other

## 2013-07-18 ENCOUNTER — Inpatient Hospital Stay (HOSPITAL_COMMUNITY)
Admission: EM | Admit: 2013-07-18 | Discharge: 2013-07-21 | DRG: 391 | Disposition: A | Discharge status: Home / Self Care | Payer: Medicare Other | Attending: Internal Medicine | Admitting: Internal Medicine

## 2013-07-18 ENCOUNTER — Encounter (HOSPITAL_COMMUNITY): Payer: Self-pay | Admitting: Emergency Medicine

## 2013-07-18 DIAGNOSIS — F329 Major depressive disorder, single episode, unspecified: Secondary | ICD-10-CM | POA: Diagnosis present

## 2013-07-18 DIAGNOSIS — E669 Obesity, unspecified: Secondary | ICD-10-CM | POA: Diagnosis present

## 2013-07-18 DIAGNOSIS — E876 Hypokalemia: Secondary | ICD-10-CM | POA: Diagnosis present

## 2013-07-18 DIAGNOSIS — I509 Heart failure, unspecified: Secondary | ICD-10-CM | POA: Diagnosis present

## 2013-07-18 DIAGNOSIS — R112 Nausea with vomiting, unspecified: Secondary | ICD-10-CM

## 2013-07-18 DIAGNOSIS — I129 Hypertensive chronic kidney disease with stage 1 through stage 4 chronic kidney disease, or unspecified chronic kidney disease: Secondary | ICD-10-CM | POA: Diagnosis present

## 2013-07-18 DIAGNOSIS — Z87891 Personal history of nicotine dependence: Secondary | ICD-10-CM

## 2013-07-18 DIAGNOSIS — M109 Gout, unspecified: Secondary | ICD-10-CM | POA: Diagnosis present

## 2013-07-18 DIAGNOSIS — Z7901 Long term (current) use of anticoagulants: Secondary | ICD-10-CM

## 2013-07-18 DIAGNOSIS — F3289 Other specified depressive episodes: Secondary | ICD-10-CM | POA: Diagnosis present

## 2013-07-18 DIAGNOSIS — G4700 Insomnia, unspecified: Secondary | ICD-10-CM | POA: Diagnosis present

## 2013-07-18 DIAGNOSIS — Z950 Presence of cardiac pacemaker: Secondary | ICD-10-CM

## 2013-07-18 DIAGNOSIS — I2510 Atherosclerotic heart disease of native coronary artery without angina pectoris: Secondary | ICD-10-CM

## 2013-07-18 DIAGNOSIS — K224 Dyskinesia of esophagus: Secondary | ICD-10-CM | POA: Diagnosis present

## 2013-07-18 DIAGNOSIS — E785 Hyperlipidemia, unspecified: Secondary | ICD-10-CM | POA: Diagnosis present

## 2013-07-18 DIAGNOSIS — N189 Chronic kidney disease, unspecified: Secondary | ICD-10-CM

## 2013-07-18 DIAGNOSIS — I4891 Unspecified atrial fibrillation: Secondary | ICD-10-CM

## 2013-07-18 DIAGNOSIS — I252 Old myocardial infarction: Secondary | ICD-10-CM

## 2013-07-18 DIAGNOSIS — Z8711 Personal history of peptic ulcer disease: Secondary | ICD-10-CM

## 2013-07-18 DIAGNOSIS — M549 Dorsalgia, unspecified: Secondary | ICD-10-CM | POA: Diagnosis present

## 2013-07-18 DIAGNOSIS — M129 Arthropathy, unspecified: Secondary | ICD-10-CM | POA: Diagnosis present

## 2013-07-18 DIAGNOSIS — I5033 Acute on chronic diastolic (congestive) heart failure: Secondary | ICD-10-CM

## 2013-07-18 DIAGNOSIS — G8929 Other chronic pain: Secondary | ICD-10-CM | POA: Diagnosis present

## 2013-07-18 DIAGNOSIS — D131 Benign neoplasm of stomach: Secondary | ICD-10-CM | POA: Diagnosis present

## 2013-07-18 DIAGNOSIS — Q613 Polycystic kidney, unspecified: Secondary | ICD-10-CM

## 2013-07-18 DIAGNOSIS — I10 Essential (primary) hypertension: Secondary | ICD-10-CM | POA: Diagnosis present

## 2013-07-18 DIAGNOSIS — E0590 Thyrotoxicosis, unspecified without thyrotoxic crisis or storm: Secondary | ICD-10-CM | POA: Diagnosis present

## 2013-07-18 DIAGNOSIS — N184 Chronic kidney disease, stage 4 (severe): Secondary | ICD-10-CM

## 2013-07-18 DIAGNOSIS — D631 Anemia in chronic kidney disease: Secondary | ICD-10-CM | POA: Diagnosis present

## 2013-07-18 DIAGNOSIS — Z9861 Coronary angioplasty status: Secondary | ICD-10-CM

## 2013-07-18 DIAGNOSIS — N179 Acute kidney failure, unspecified: Secondary | ICD-10-CM

## 2013-07-18 DIAGNOSIS — D649 Anemia, unspecified: Secondary | ICD-10-CM

## 2013-07-18 DIAGNOSIS — K317 Polyp of stomach and duodenum: Secondary | ICD-10-CM

## 2013-07-18 DIAGNOSIS — I739 Peripheral vascular disease, unspecified: Secondary | ICD-10-CM | POA: Diagnosis present

## 2013-07-18 DIAGNOSIS — K5900 Constipation, unspecified: Secondary | ICD-10-CM | POA: Diagnosis present

## 2013-07-18 DIAGNOSIS — K219 Gastro-esophageal reflux disease without esophagitis: Secondary | ICD-10-CM | POA: Diagnosis present

## 2013-07-18 DIAGNOSIS — R1310 Dysphagia, unspecified: Principal | ICD-10-CM

## 2013-07-18 LAB — BASIC METABOLIC PANEL
BUN: 37 mg/dL — ABNORMAL HIGH (ref 6–23)
Calcium: 9.3 mg/dL (ref 8.4–10.5)
Chloride: 97 mEq/L (ref 96–112)
GFR calc Af Amer: 16 mL/min — ABNORMAL LOW (ref >90)
GFR calc non Af Amer: 14 mL/min — ABNORMAL LOW (ref >90)
Potassium: 3.5 mEq/L (ref 3.5–5.1)
Sodium: 137 mEq/L (ref 135–145)

## 2013-07-18 LAB — PROTIME-INR
INR: 1.42 (ref 0.00–1.49)
Prothrombin Time: 17 seconds — ABNORMAL HIGH (ref 11.6–15.2)

## 2013-07-18 LAB — URINALYSIS, ROUTINE W REFLEX MICROSCOPIC
Bilirubin Urine: NEGATIVE
Hgb urine dipstick: NEGATIVE
Ketones, ur: NEGATIVE mg/dL
Leukocytes, UA: NEGATIVE
Nitrite: NEGATIVE
Protein, ur: NEGATIVE mg/dL
Specific Gravity, Urine: 1.01 (ref 1.005–1.030)
Urobilinogen, UA: 0.2 mg/dL (ref 0.0–1.0)

## 2013-07-18 LAB — CBC WITH DIFFERENTIAL/PLATELET
Basophils Absolute: 0 10*3/uL (ref 0.0–0.1)
Basophils Relative: 0 % (ref 0–1)
Eosinophils Relative: 0 % (ref 0–5)
Hemoglobin: 10.5 g/dL — ABNORMAL LOW (ref 12.0–15.0)
MCHC: 33.9 g/dL (ref 30.0–36.0)
MCV: 89.1 fL (ref 78.0–100.0)
Monocytes Absolute: 0.7 10*3/uL (ref 0.1–1.0)
Monocytes Relative: 8 % (ref 3–12)
Neutro Abs: 6.8 10*3/uL (ref 1.7–7.7)
Neutrophils Relative %: 85 % — ABNORMAL HIGH (ref 43–77)
Platelets: 164 10*3/uL (ref 150–400)
RBC: 3.48 MIL/uL — ABNORMAL LOW (ref 3.87–5.11)
RDW: 15.3 % (ref 11.5–15.5)

## 2013-07-18 LAB — TROPONIN I: Troponin I: <0.3 ng/mL (ref <0.30)

## 2013-07-18 MED ORDER — METOPROLOL TARTRATE 1 MG/ML IV SOLN: 5.0000 mg | INTRAVENOUS | Status: DC | PRN

## 2013-07-18 MED ORDER — ONDANSETRON HCL 4 MG PO TABS: 4.0000 mg | ORAL_TABLET | Freq: Four times a day (QID) | ORAL | Status: DC | PRN

## 2013-07-18 MED ORDER — SODIUM CHLORIDE 0.9 % IJ SOLN
3.0000 mL | Freq: Two times a day (BID) | INTRAMUSCULAR | Status: DC
  Administered 2013-07-19 – 2013-07-20 (×2): 3 mL via INTRAVENOUS

## 2013-07-18 MED ORDER — ALPRAZOLAM 0.5 MG PO TABS
0.5000 mg | ORAL_TABLET | Freq: Three times a day (TID) | ORAL | Status: DC | PRN
  Administered 2013-07-19 – 2013-07-20 (×2): 0.5 mg via ORAL
  Filled 2013-07-18 (×2): qty 1

## 2013-07-18 MED ORDER — AMLODIPINE BESYLATE 5 MG PO TABS
5.0000 mg | ORAL_TABLET | Freq: Every day | ORAL | Status: DC
  Administered 2013-07-19 – 2013-07-21 (×3): 5 mg via ORAL
  Filled 2013-07-18 (×3): qty 1

## 2013-07-18 MED ORDER — SIMVASTATIN 10 MG PO TABS
10.0000 mg | ORAL_TABLET | Freq: Every day | ORAL | Status: DC
  Administered 2013-07-18 – 2013-07-20 (×3): 10 mg via ORAL
  Filled 2013-07-18 (×4): qty 1

## 2013-07-18 MED ORDER — DEXTROSE 5 % AND 0.45 % NACL IV BOLUS: 35.0000 mL | INTRAVENOUS | Status: DC

## 2013-07-18 MED ORDER — HYDRALAZINE HCL 25 MG PO TABS
25.0000 mg | ORAL_TABLET | Freq: Three times a day (TID) | ORAL | Status: DC
Start: 2013-07-18 — End: 2013-07-21
  Administered 2013-07-18 – 2013-07-21 (×7): 25 mg via ORAL
  Filled 2013-07-18 (×10): qty 1

## 2013-07-18 MED ORDER — ALBUTEROL SULFATE (5 MG/ML) 0.5% IN NEBU: 2.5000 mg | INHALATION_SOLUTION | RESPIRATORY_TRACT | Status: DC | PRN

## 2013-07-18 MED ORDER — POLYETHYLENE GLYCOL 3350 17 G PO PACK
17.0000 g | PACK | Freq: Every day | ORAL | Status: DC | PRN
  Filled 2013-07-18: qty 1

## 2013-07-18 MED ORDER — ISOSORBIDE MONONITRATE ER 60 MG PO TB24
60.0000 mg | ORAL_TABLET | Freq: Two times a day (BID) | ORAL | Status: DC
  Administered 2013-07-18 – 2013-07-21 (×6): 60 mg via ORAL
  Filled 2013-07-18 (×7): qty 1

## 2013-07-18 MED ORDER — NITROGLYCERIN 0.4 MG SL SUBL: 0.4000 mg | SUBLINGUAL_TABLET | SUBLINGUAL | Status: DC | PRN

## 2013-07-18 MED ORDER — METOPROLOL TARTRATE 50 MG PO TABS
75.0000 mg | ORAL_TABLET | Freq: Three times a day (TID) | ORAL | Status: DC
  Administered 2013-07-18 – 2013-07-21 (×6): 75 mg via ORAL
  Filled 2013-07-18 (×10): qty 1

## 2013-07-18 MED ORDER — GUAIFENESIN-DM 100-10 MG/5ML PO SYRP
5.0000 mL | ORAL_SOLUTION | ORAL | Status: DC | PRN
  Filled 2013-07-18: qty 5

## 2013-07-18 MED ORDER — DEXTROSE-NACL 5-0.45 % IV SOLN
INTRAVENOUS | Status: AC
  Administered 2013-07-18: 20:00:00 via INTRAVENOUS

## 2013-07-18 MED ORDER — PANTOPRAZOLE SODIUM 40 MG IV SOLR
40.0000 mg | Freq: Two times a day (BID) | INTRAVENOUS | Status: DC
  Administered 2013-07-18 – 2013-07-19 (×2): 40 mg via INTRAVENOUS
  Filled 2013-07-18 (×3): qty 40

## 2013-07-18 MED ORDER — MAGNESIUM OXIDE 400 MG PO TABS: 400.0000 mg | ORAL_TABLET | Freq: Every day | ORAL | Status: DC

## 2013-07-18 MED ORDER — HEPARIN SODIUM (PORCINE) 5000 UNIT/ML IJ SOLN
5000.0000 [IU] | Freq: Three times a day (TID) | INTRAMUSCULAR | Status: DC
  Administered 2013-07-18 – 2013-07-19 (×2): 5000 [IU] via SUBCUTANEOUS
  Filled 2013-07-18 (×5): qty 1

## 2013-07-18 MED ORDER — SODIUM CHLORIDE 0.9 % IV SOLN: INTRAVENOUS | Status: DC

## 2013-07-18 MED ORDER — ONDANSETRON HCL 4 MG/2ML IJ SOLN: 4.0000 mg | Freq: Four times a day (QID) | INTRAMUSCULAR | Status: DC | PRN

## 2013-07-18 MED ORDER — FUROSEMIDE 10 MG/ML IJ SOLN
80.0000 mg | Freq: Two times a day (BID) | INTRAMUSCULAR | Status: DC
  Administered 2013-07-18 – 2013-07-21 (×6): 80 mg via INTRAVENOUS
  Filled 2013-07-18 (×9): qty 8

## 2013-07-18 MED ORDER — MAGNESIUM OXIDE 400 (241.3 MG) MG PO TABS
400.0000 mg | ORAL_TABLET | Freq: Every day | ORAL | Status: DC
  Administered 2013-07-18 – 2013-07-20 (×3): 400 mg via ORAL
  Filled 2013-07-18 (×4): qty 1

## 2013-07-18 MED ORDER — OXYCODONE-ACETAMINOPHEN 5-325 MG PO TABS
1.0000 | ORAL_TABLET | Freq: Three times a day (TID) | ORAL | Status: DC | PRN
  Administered 2013-07-18 – 2013-07-21 (×4): 1 via ORAL
  Filled 2013-07-18 (×4): qty 1

## 2013-07-18 MED ORDER — HYDRALAZINE HCL 20 MG/ML IJ SOLN: 10.0000 mg | Freq: Four times a day (QID) | INTRAMUSCULAR | Status: DC | PRN

## 2013-07-18 MED ORDER — NAPHAZOLINE HCL 0.1 % OP SOLN
2.0000 [drp] | Freq: Four times a day (QID) | OPHTHALMIC | Status: DC | PRN
  Filled 2013-07-18: qty 15

## 2013-07-18 NOTE — ED Provider Notes (Signed)
CSN: [id number]     Arrival date & time 07/18/13  7829 History   First MD Initiated Contact with Patient 07/18/13 0857     Chief Complaint  Patient presents with  . Dysphagia   (Consider location/radiation/quality/duration/timing/severity/associated sxs/prior Treatment) HPI Comments: [name] is a 78 y.o. female who presents for the evaluation of painful swallowing. This occurred during the night, when she awakened, and try to drink water. The water her to swallow. She was able to swallow without vomiting, coughing or choking. Subsequent to that she developed pain in her left chest that radiated to her left arm. The pain was burning. She did not take anything for it at that time, but later called EMS, who transported her here and during that transfer treated her with aspirin and nitroglycerin. She was recently discharged from the hospital after evaluation for chest pain. She reportedly had a cardiac catheterization that was reassuring. Hospital discharge. She was told to stop taking spironolactone. Since that time she has gained about 10 pounds according to family members. She has a chronic problem with swallowing characterized by pain with swallowing. She's never had an esophageal food bolus. She does have a history of peptic ulcer disease, but has not had endoscopy in several years. She is taking her medications, as prescribed. There are no other known modifying factors.   The history is provided by the patient.    Past Medical History  Diagnosis Date  . Cardiac pacemaker in situ   . Cellulitis and abscess of unspecified site     a. h/o MSRA cellulitis of abdominal wall.  Marland Kitchen CAD (coronary artery disease)     a. DES to OM 01/2005. b. Rotational atherotomy/DES to prox LAD 02/2008. c. Cath 07/2013: stable, for med rx.  . Edema   . Chronic renal insufficiency     Cr ~2.3.  . Warfarin anticoagulation     a. Followed by PCP.  Marland Kitchen GI bleed     a. EGD 01/2007: antral ulcers related to  NSAID/aspirin use.  . Polycystic kidney disease     a. Per remote E-Chart records.  . Peripheral vascular disease   . Hyperthyroidism     a. Hx of intolerance to PTU.  Marland Kitchen Blood transfusion     no abnormal reaction  . H/O hiatal hernia   . Umbilical hernia     unrepaired (11/24/11)  . Arthritis   . Valvular heart disease     Echo 11/2011: mild-mod MR.  Marland Kitchen Hypertension     takes Amlodipine,Hydralazine,and Imdur daily  . Hyperlipidemia     takes Lovastatin daily  . Anemia, unspecified     takes an Iron Pill daily  . SSS (sick sinus syndrome)     a. H/o AV node ablation 2008 secondary to difficult to control afib/flutter, with subsequent pacer.  . Atrial fibrillation with rapid ventricular response     a. H/o amiodarone thyroiditis. b. s/p AV node ablation 2008 with implantation of PPM 01/2007, upgraded to CRT-P 06/2007.;takes Coumadin daily  . GERD (gastroesophageal reflux disease)     takes Protonix daily  . Chronic diastolic CHF (congestive heart failure)     takes Lasix daily and Spironolactone   . Myocardial infarction 1996  . Pacemaker     [employer]  . Migraines     last migraine has been a while but headaches dail  . Chronic back pain     deteriorating disc  . History of gout   . Bruises easily   .  Hemorrhoids   . History of kidney stones   . Depression     takes Xanax daily  . Insomnia     takes Xanax if needed   Past Surgical History  Procedure Laterality Date  . Esophagogastroduodenoscopy    . Tonsillectomy  ~ 1960  . Cardiac catheterization  2006; 2008; 2009; 01/13/2011  . Back surgery    . Laminectomy and microdiscectomy lumbar spine  08/2003  . Cardioversion  10/2004; 02/2007    /E-chart  . Cholecystectomy    . Appendectomy  1954  . Vaginal hysterectomy  ?1981  . Av node ablation  02/2007    /E-chart  . Insert / replace / remove pacemaker  01/2007    initial placement  . Insert / replace / remove pacemaker  06/2007    upgraded/E-chart  . Av fistula  placement, brachiocephalic  09/2007    right; "didn't work; my veins were too small"  . Joint replacement    . Total knee arthroplasty  01/2009    left  . Bladder and bowel  1970's    "tacked; damaged my intestines & had to remove some; done @ [facility]  . Coronary angioplasty  2    percutanous transluminal   . Bilateral cataract surgery    . Bascilic vein transposition Right 06/21/2013    Procedure: BASCILIC VEIN TRANSPOSITION 1ST STAGE;  Surgeon: Nada Libman, MD;  Location: [facility] OR;  Service: Vascular;  Laterality: Right;   Family History  Problem Relation Age of Onset  . Colon cancer Neg Hx   . Thyroid disease Neg Hx   . Stroke Brother   . Hypertension Brother   . Kidney disease Brother   . Other Mother     Sepsis and perforated colon  . Other Father     Motor vehicle accident   History  Substance Use Topics  . Smoking status: Former Smoker -- 1.00 packs/day for 50 years    Types: Cigarettes    Quit date: 07/13/1995  . Smokeless tobacco: Never Used     Comment: quit smoking around 1996  . Alcohol Use: No   OB History   Grav Para Term Preterm Abortions TAB SAB Ect Mult Living                 Review of Systems  All other systems reviewed and are negative.    Allergies  Amiodarone and Propylthiouracil  Home Medications   No current outpatient prescriptions on file. BP 129/60  Pulse 60  Temp(Src) 98 F (36.7 C) (Oral)  Resp 18  Ht 5\' 7"  (1.702 m)  Wt 210 lb 6 oz (95.425 kg)  BMI 32.94 kg/m2  SpO2 96% Physical Exam  Nursing note and vitals reviewed. Constitutional: She is oriented to person, place, and time. She appears well-developed.  Elderly, frail  HENT:  Head: Normocephalic and atraumatic.  Eyes: Conjunctivae and EOM are normal. Pupils are equal, round, and reactive to light.  Neck: Normal range of motion and phonation normal. Neck supple.  Cardiovascular: Normal rate, regular rhythm and intact distal pulses.   Pulmonary/Chest: Effort normal.  She exhibits no tenderness.  Decreased air movement bilaterally  Abdominal: Soft. She exhibits no distension. There is no tenderness (Mild diffuse). There is no guarding.  Musculoskeletal: Normal range of motion. She exhibits edema (bilateral 2+).  Neurological: She is alert and oriented to person, place, and time. She exhibits normal muscle tone.  Skin: Skin is warm and dry.  Psychiatric: She has a  normal mood and affect. Her behavior is normal. Judgment and thought content normal.    ED Course  Procedures (including critical care time)  Medications  furosemide (LASIX) injection 80 mg (80 mg Intravenous Given 07/18/13 1525)  isosorbide mononitrate (IMDUR) 24 hr tablet 60 mg (not administered)  naphazoline (NAPHCON) 0.1 % ophthalmic solution 2 drop (not administered)  amLODipine (NORVASC) tablet 5 mg (not administered)  metoprolol tartrate (LOPRESSOR) tablet 75 mg (not administered)  nitroGLYCERIN (NITROSTAT) SL tablet 0.4 mg (not administered)  oxyCODONE-acetaminophen (PERCOCET/ROXICET) 5-325 MG per tablet 1 tablet (not administered)  hydrALAZINE (APRESOLINE) tablet 25 mg (not administered)  ALPRAZolam (XANAX) tablet 0.5 mg (not administered)  simvastatin (ZOCOR) tablet 10 mg (not administered)  ondansetron (ZOFRAN) tablet 4 mg (not administered)    Or  ondansetron (ZOFRAN) injection 4 mg (not administered)  albuterol (PROVENTIL) (5 MG/ML) 0.5% nebulizer solution 2.5 mg (not administered)  guaiFENesin-dextromethorphan (ROBITUSSIN DM) 100-10 MG/5ML syrup 5 mL (not administered)  heparin injection 5,000 Units (not administered)  sodium chloride 0.9 % injection 3 mL (not administered)  polyethylene glycol (MIRALAX / GLYCOLAX) packet 17 g (not administered)  pantoprazole (PROTONIX) injection 40 mg (not administered)  dextrose 5 % and 0.45% NaCl 5-0.45 % bolus 35 mL (not administered)  metoprolol (LOPRESSOR) injection 5 mg (not administered)  hydrALAZINE (APRESOLINE) injection 10 mg (not  administered)  magnesium oxide (MAG-OX) tablet 400 mg (not administered)     Patient Vitals for the past 24 hrs:  BP Temp Temp src Pulse Resp SpO2 Height Weight  07/18/13 1500 129/60 mmHg - - 60 18 96 % - -  07/18/13 1458 129/60 mmHg 98 F (36.7 C) Oral 60 18 97 % - -  07/18/13 1307 131/83 mmHg - - 61 20 100 % - -  07/18/13 1225 131/67 mmHg - - 60 16 97 % - -  07/18/13 1032 128/67 mmHg 98 F (36.7 C) Oral 66 24 98 % - -  07/18/13 0854 135/58 mmHg 97.7 F (36.5 C) Oral 61 20 98 % 5\' 7"  (1.702 m) 210 lb 6 oz (95.425 kg)  07/18/13 0850 - - - - - 97 % - -   Filed Weights   07/18/13 0854  Weight: 210 lb 6 oz (95.425 kg)   Per records, her weight, 07/16/13 was 95.6 kg.   14:50- case discussed with cardiology( Dr. Patty Sermons), who came to the emergency department to see the patient. 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 They request medical admission. They agree, that her chest discomfort, needs to be evaluated by GI and this may need to be done as an inpatient.    3:01 PM-Consult complete with hospitalist. Patient case explained and discussed. He agrees to admit patient for further evaluation and treatment. Call ended at 15:15        Labs Review Labs Reviewed  CBC WITH DIFFERENTIAL - Abnormal; Notable for the following:    RBC 3.48 (*)    Hemoglobin 10.5 (*)    HCT 31.0 (*)    Neutrophils Relative % 85 (*)    Lymphocytes Relative 6 (*)    Lymphs Abs 0.5 (*)    All other components within normal limits  BASIC METABOLIC PANEL - Abnormal; Notable for the following:    Glucose, Bld 115 (*)    BUN 37 (*)    Creatinine, Ser 3.00 (*)    GFR calc non Af Amer 14 (*)    GFR calc Af  Amer 16 (*)    All other components within normal limits  PRO B NATRIURETIC PEPTIDE - Abnormal; Notable for the following:    Pro B Natriuretic peptide (BNP) 14148.0 (*)    All other components within normal limits  PROTIME-INR - Abnormal;  Notable for the following:    Prothrombin Time 17.0 (*)    All other components within normal limits  URINE CULTURE  TROPONIN I  URINALYSIS, ROUTINE W REFLEX MICROSCOPIC  CBC  CREATININE, SERUM  BASIC METABOLIC PANEL  CBC   Imaging Review Dg Chest 2 View  07/18/2013   CLINICAL DATA:  Dysphasia  EXAM: CHEST  2 VIEW  COMPARISON:  07/09/2013  FINDINGS: Mild cardiac enlargement.  Pacemaker leads are unchanged.  Hypoventilation with bibasilar atelectasis, increased from the prior study. Negative for heart failure or effusion.  IMPRESSION: Hypoventilation with bibasilar atelectasis.   Electronically Signed   By: Marlan Palau M.D.   On: 07/18/2013 12:15    EKG Interpretation    Date/Time:  Thursday July 18 2013 10:41:27 EST Ventricular Rate:  60 PR Interval:  204 QRS Duration: 107 QT Interval:  458 QTC Calculation: 458 R Axis:   -73 Text Interpretation:  Ventricular-paced rhythm No further analysis attempted due to paced rhythm Since last tracing Confirmed by Sanjuana Mruk  MD, Torrin Frein ([id number]) on 07/18/2013 11:10:26 AM           EKG Interpretation    Date/Time:  Thursday July 18 2013 10:41:27 EST Ventricular Rate:  60 PR Interval:  204 QRS Duration: 107 QT Interval:  458 QTC Calculation: 458 R Axis:   -73 Text Interpretation:  Ventricular-paced rhythm No further analysis attempted due to paced rhythm Since last tracing Confirmed by Satcha Storlie  MD, Frankye Schwegel ([id number]) on 07/18/2013 11:10:26 AM               MDM   1. Acute on chronic diastolic heart failure   2. Acute-on-chronic kidney injury   3. Anemia, unspecified   4. Atrial fibrillation   5. CKD (chronic kidney disease) stage 4, GFR 15-29 ml/min   6. Dysphagia    Nursing Notes Reviewed/ Care Coordinated, and agree without changes. Applicable Imaging Reviewed.  Interpretation of Laboratory Data incorporated into ED treatment   Plan: Admit    Flint Melter, MD 07/18/13 1719

## 2013-07-18 NOTE — ED Notes (Signed)
Pt from home, c/o difficulty swallowing. Pt state she woke up at 2:30 to drink, states water didn't go down, pain in throat, radiating to ears and sternum. Pt released from hosp Tues for CP workup. Pt received 324 asa and 2 nitro. Relief with O2 therapy per EMS. Denies pain at this time

## 2013-07-18 NOTE — ED Notes (Signed)
Patient transported to X-ray 

## 2013-07-18 NOTE — Consult Note (Signed)
[facility] Consult: 3:23 PM 07/18/2013  LOS: 0 days    Referring Provider: Dr Ella Jubilee  Primary Care Physician:  [name], MD Primary Gastroenterologist:  Dr. Christella Hartigan     Reason for Consultation:  Dysphagia.    HPI: [name] is a 78 y.o. female.  hx CAD with DES placed in 2006 and in 2009. Hx Afib, hx AV node ablation in 2008. S/p pacemaker.  Diastolic heartfailure. Stage 4 CKD. S.p placement of right UE fistula for imminent need for HD. On po Iron for many years for anemia of chronic renal failure.   Recent 11/30 - 07/16/13 admission for chest pain.  Had cardiac cath showing "diffuse nonobstructive disease with continued patency of the stented segments in the proximal LAD and first obtuse marginal branches".   Medical therapy recommended. Her Coumadin was restarted 07/16/13.  Dr Ladona Ridgel felt the chest pain was GI in nature.  No GI consultation sought.  She was continued on her previous dose of Protonix 40 mg daily.   GI hx of GIB from gastric ulcers in 2008.  On Esophagram in 2013 she had "Mildly diminished primary esophageal peristalsis. No fixed esophageal narrowing or stricture. Small hiatal hernia. Mild gastroesophageal reflux." She has not been seen by Dr Christella Hartigan of GI since 2011 for EGD to eval anemia.   Presents to ED today with DOE and acute dysphagia. Has had solid and some liquid dysphagia for at least one year.  Sometimes coughs with POs.  Feels like her dry mouth is adding to swallowing problems.  Food gets stuck in chest.  Will sometimes have to regurgitate.   Last night she drank water alone from a bottle and it got hung, she coughed and eventually it went down but she had pain in region of mid to lower esophagus afterwards.  Her SOB has been progressing along  With 8 # weight gain since  discharge and increased LE edema.Her family reports pt choking on chicken and any type of meat.She coughs at night Takes BID PPI and does not get much if any, heartburn.  Appetite reduced and po's tend to cause increased abdominal bloating.   + constipation, Metamucil prn.    PRIOR ENDOSCOPIC STUDIES: 11/26/09  EGD  For drifting Hgb and dark stools 1) Distal gastric polyp that appeared inflammtory; single biopsy  created more than usual bleeding, this was treated with single  injection of dilute epinephrine. I think this poylp is the source  of her anemia, chronically dark stools.  2) Otherwise normal examination  RECOMMENDATIONS:  If the polyp is proven to be hyperplastic AND she continues to  be anemic then will repeat EGD and attempt to ablate the lesion  with APC.  She is on coumadin and undoubtedly her thin blood contributes to  the amount of blood loss created by the polyp.  08/31/2009  Colonoscopy For dark stools and anemia.  ENDOSCOPIC IMPRESSION:  1) External hemorrhoids  2) Otherwise normal examination; no polyps or cancers  RECOMMENDATIONS:  1) Continue current colorectal screening recommendations for  "routine risk" patients with  a repeat colonoscopy in 10 years.  03/2007   EGD  Surveillance of: Prior gastric ulcer: several small antral ulcers seen by EGD 6/08 (CLO testing negative, she was taking daily NSAIDs) Assessment  THE PREVIOUSLY SEEN ANTRAL ULCERS HAVE HEALED. I SUSPECT THESE WERE  NSAID RELATED (SHE WAS TAKING 2-3 ALLEVE DAILY AND CLO TESTING WAS  NEGATIVE). SHORT AREA OF FRIABLE DUODENAL MUCOSA THAT DOES NOT  APPEAR ADENOMATOUS OR ULCERATED, THIS MAY BE RELATED TO NSAID INJURY  AS WELL.    Past Medical History  Diagnosis Date  . Cardiac pacemaker in situ   . Cellulitis and abscess of unspecified site     a. h/o MSRA cellulitis of abdominal wall.  Marland Kitchen CAD (coronary artery disease)     a. DES to OM 01/2005. b. Rotational atherotomy/DES to prox LAD 02/2008.  c. Cath 07/2013: stable, for med rx.  . Edema   . Chronic renal insufficiency     Cr ~2.3.  . Warfarin anticoagulation     a. Followed by PCP.  Marland Kitchen GI bleed     a. EGD 01/2007: antral ulcers related to NSAID/aspirin use.  . Polycystic kidney disease     a. Per remote E-Chart records.  . Peripheral vascular disease   . Hyperthyroidism     a. Hx of intolerance to PTU.  Marland Kitchen Blood transfusion     no abnormal reaction  . H/O hiatal hernia   . Umbilical hernia     unrepaired (11/24/11)  . Arthritis   . Valvular heart disease     Echo 11/2011: mild-mod MR.  Marland Kitchen Hypertension     takes Amlodipine,Hydralazine,and Imdur daily  . Hyperlipidemia     takes Lovastatin daily  . Anemia, unspecified     takes an Iron Pill daily  . SSS (sick sinus syndrome)     a. H/o AV node ablation 2008 secondary to difficult to control afib/flutter, with subsequent pacer.  . Atrial fibrillation with rapid ventricular response     a. H/o amiodarone thyroiditis. b. s/p AV node ablation 2008 with implantation of PPM 01/2007, upgraded to CRT-P 06/2007.;takes Coumadin daily  . GERD (gastroesophageal reflux disease)     takes Protonix daily  . Chronic diastolic CHF (congestive heart failure)     takes Lasix daily and Spironolactone   . Myocardial infarction 1996  . Pacemaker     Medtronic  . Migraines     last migraine has been a while but headaches dail  . Chronic back pain     deteriorating disc  . History of gout   . Bruises easily   . Hemorrhoids   . History of kidney stones   . Depression     takes Xanax daily  . Insomnia     takes Xanax if needed    Past Surgical History  Procedure Laterality Date  . Esophagogastroduodenoscopy    . Tonsillectomy  ~ 1960  . Cardiac catheterization  2006; 2008; 2009; 01/13/2011  . Back surgery    . Laminectomy and microdiscectomy lumbar spine  08/2003  . Cardioversion  10/2004; 02/2007    /E-chart  . Cholecystectomy    . Appendectomy  1954  . Vaginal hysterectomy   ?1981  . Av node ablation  02/2007    /E-chart  . Insert / replace / remove pacemaker  01/2007    initial placement  . Insert / replace / remove pacemaker  06/2007    upgraded/E-chart  . Av fistula placement, brachiocephalic  09/2007  right; "didn't work; my veins were too small"  . Joint replacement    . Total knee arthroplasty  01/2009    left  . Bladder and bowel  1970's    "tacked; damaged my intestines & had to remove some; done @ [facility]  . Coronary angioplasty  2    percutanous transluminal   . Bilateral cataract surgery    . Bascilic vein transposition Right 06/21/2013    Procedure: BASCILIC VEIN TRANSPOSITION 1ST STAGE;  Surgeon: Nada Libman, MD;  Location: [facility] OR;  Service: Vascular;  Laterality: Right;    Prior to Admission medications   Medication Sig Start Date End Date Taking? Authorizing Provider  ALPRAZolam Prudy Feeler) 0.5 MG tablet Take 0.5 mg by mouth 3 (three) times daily as needed.    Yes Historical Provider, MD  amLODipine (NORVASC) 5 MG tablet Take 1 tablet (5 mg total) by mouth daily. 07/09/13  Yes Beatrice Lecher, PA-C  aspirin EC 81 MG tablet Take 81 mg by mouth daily.   Yes Historical Provider, MD  ferrous sulfate 325 (65 FE) MG tablet Take 325 mg by mouth daily with breakfast.    Yes Historical Provider, MD  furosemide (LASIX) 40 MG tablet Take 40 mg by mouth daily.    Yes Historical Provider, MD  hydrALAZINE (APRESOLINE) 25 MG tablet Take 1 tablet (25 mg total) by mouth 3 (three) times daily. 03/27/13  Yes Rosalio Macadamia, NP  isosorbide mononitrate (IMDUR) 60 MG 24 hr tablet Take 60 mg by mouth 2 (two) times daily. 07/01/13  Yes Scott Moishe Spice, PA-C  lovastatin (MEVACOR) 10 MG tablet Take 10 mg by mouth at bedtime.    Yes Historical Provider, MD  magnesium oxide (MAG-OX) 400 MG tablet Take 400 mg by mouth at bedtime.    Yes Historical Provider, MD  metoprolol tartrate (LOPRESSOR) 25 MG tablet Take 3 tablets (75 mg total) by mouth 3 (three) times daily.  07/01/13  Yes Beatrice Lecher, PA-C  nitroGLYCERIN (NITROSTAT) 0.4 MG SL tablet Place 0.4 mg under the tongue every 5 (five) minutes as needed for chest pain.   Yes Historical Provider, MD  oxyCODONE-acetaminophen (PERCOCET/ROXICET) 5-325 MG per tablet Take 1 tablet by mouth 3 (three) times daily as needed for moderate pain. For pain 06/21/13  Yes Suzanne L Nickel, NP  pantoprazole (PROTONIX) 40 MG tablet Take 40 mg by mouth 2 (two) times daily.    Yes Historical Provider, MD  paricalcitol (ZEMPLAR) 1 MCG capsule Take 1 mcg by mouth at bedtime.   Yes Historical Provider, MD  tetrahydrozoline 0.05 % ophthalmic solution Place 1 drop into both eyes 2 (two) times daily as needed (reddness).   Yes Historical Provider, MD  warfarin (COUMADIN) 2 MG tablet Pt takes  1 tablet (2mg ) on Tuesday, Wednesday, Thursday, Saturday, Sunday, and 1.5 tablets (3mg ) on Monday and Friday. 07/16/13  Yes Dayna N Dunn, PA-C    Scheduled Meds: . furosemide  80 mg Intravenous BID   Infusions:   PRN Meds:    Allergies as of 07/18/2013 - Review Complete 07/18/2013  Allergen Reaction Noted  . Amiodarone Hives   . Propylthiouracil Rash 11/23/2011    Family History  Problem Relation Age of Onset  . Colon cancer Neg Hx   . Thyroid disease Neg Hx   . Stroke Brother   . Hypertension Brother   . Kidney disease Brother   . Other Mother     Sepsis and perforated colon  . Other Father  Motor vehicle accident    History   Social History  . Marital Status: Widowed    Spouse Name: N/A    Number of Children: N/A  . Years of Education: N/A   Occupational History  . Not on file.   Social History Main Topics  . Smoking status: Former Smoker -- 1.00 packs/day for 50 years    Types: Cigarettes    Quit date: 07/13/1995  . Smokeless tobacco: Never Used     Comment: quit smoking around 1996  . Alcohol Use: No  . Drug Use: No  . Sexual Activity: No   Other Topics Concern  . Not on file   Social History  Narrative   Lives in [place].    REVIEW OF SYSTEMS: Constitutional:  Generally is fatigued, no worse than usual. ENT:  No nose bleeds Pulm:  +DOE, +Orthopnea CV:  No chest pain or palpitations GU:  No dysuria or oligurea GI:  Per HPI Heme:  No procrit.    Transfusions: in past.  Nothing recently Neuro:  No headache, no loss of sensation Derm:  Occasional non-pruritic rash in small patches at various locations Endocrine:  No excessive thirst, no sweats, no chills Immunization:   Up to date flu and pneumovax.  Travel:  none   PHYSICAL EXAM: Vital signs in last 24 hours: Filed Vitals:   07/18/13 1458  BP: 129/60  Pulse: 60  Temp: 98 F (36.7 C)  Resp: 18   Wt Readings from Last 3 Encounters:  07/18/13 95.425 kg (210 lb 6 oz)  07/16/13 95.6 kg (210 lb 12.2 oz)  07/16/13 95.6 kg (210 lb 12.2 oz)    General: chronically unwell, obese elderly WF.  She is comfortable Head:  No asymmetry or swelling  Eyes:  No icterus, slight pallor Ears:  Not HOH  Nose:  No discharge or sneezing Mouth:  Clear, slightly dehydrated oral MM.  Neck:  No mass of JVD Lungs:  Crackle in bases.  No cough.  No dyspnea with speach Heart: RRR.  No MRG Abdomen:  Obese, not tender, not distended.  No mass, no HSM, no fluid wave or bulging flanks. Active BS Rectal: deferred   Musc/Skeltl: arthritic changes in knees Extremities:  + pedal edema, without pitting  Neurologic:  Oriented x 3.   No tremor, no asterixis.   Skin:  No rash or sores, no jaundice Tattoos:  none Nodes:  No cervical adenopathy.    Psych:  Cooperative, not agitated or anxious.   Intake/Output from previous day:   Intake/Output this shift:    LAB RESULTS:  Recent Labs  07/18/13 1134  WBC 8.0  HGB 10.5*  HCT 31.0*  PLT 164   BMET Lab Results  Component Value Date   NA 137 07/18/2013   NA 134* 07/16/2013   NA 131* 07/15/2013   K 3.5 07/18/2013   K 4.9 07/16/2013   K 3.5 07/15/2013   CL 97 07/18/2013   CL 97 07/16/2013    CL 96 07/15/2013   CO2 27 07/18/2013   CO2 25 07/16/2013   CO2 22 07/15/2013   GLUCOSE 115* 07/18/2013   GLUCOSE 104* 07/16/2013   GLUCOSE 99 07/15/2013   BUN 37* 07/18/2013   BUN 35* 07/16/2013   BUN 38* 07/15/2013   CREATININE 3.00* 07/18/2013   CREATININE 2.66* 07/16/2013   CREATININE 2.84* 07/15/2013   CALCIUM 9.3 07/18/2013   CALCIUM 9.3 07/16/2013   CALCIUM 8.8 07/15/2013    PT/INR Lab Results  Component Value  Date   INR 1.12 07/16/2013   INR 1.27 07/14/2013   INR 2.2* 07/09/2013     RADIOLOGY STUDIES: Dg Chest 2 View 07/18/2013   CLINICAL DATA:  Dysphasia  EXAM: CHEST  2 VIEW  COMPARISON:  07/09/2013  FINDINGS: Mild cardiac enlargement.  Pacemaker leads are unchanged.  Hypoventilation with bibasilar atelectasis, increased from the prior study. Negative for heart failure or effusion.  IMPRESSION: Hypoventilation with bibasilar atelectasis.   Electronically Signed   By: Marlan Palau M.D.   On: 07/18/2013 12:15     IMPRESSION:   *  Dysphagia.  Acute on chronic.  *  Dyspnea and weight gain of 8 # since discharge 12/2.   *  [place] [address], no overt failure on xray.   *  Chronic kidney disease. Creatinine is worse today.  Recent cardiac cath/contrast exposure.    *   Chronic normocytic anemia.  On chronic po Iron.   *  Chronic A fib.  Chronic Coumadin.  INR subtherapeutic.   *  Hx GIB from NSAID ulcers.  On BID Protonix and no longer uses NSAIDS    PLAN:     *  Esophagram tomorrow.   *  Hold Coumadin as this will allow MD to dilate esophagus if indicated    Jennye Moccasin  07/18/2013, 3:23 PM Pager: [phone] Attending MD note:   I have taken a history, examined the patient, and reviewed the chart.We will proceed with EGD/ diagnostic to assess for es stricture. She has a documented esophageal dysmotility and  Barium stasis in supine position on esophagram but she has had  Choking on solids recently, which could be due to Candida esophagitis or a new stricture .  Discussed with the pt. May need further eval by speech pathology to assess for aspiration  [facility] Pager # [phone]

## 2013-07-18 NOTE — Consult Note (Signed)
Patient ID: [name]: [id number], DOB/AGE: 01-20-1935   Admit date: 07/18/2013   Primary Physician: [name], MD Primary Cardiologist: P. [name], MD   Pt. Profile:  78 y/o female with h/o CAD, CKD IV, afib (coumadin), sss s/p ppm and AVN RFCA, who was recently discharged following admission for c/p who presents back to the ED with dysphagia and DOE.  Problem List  Past Medical History  Diagnosis Date  . Cardiac pacemaker in situ   . Cellulitis and abscess of unspecified site     a. h/o MSRA cellulitis of abdominal wall.  Marland Kitchen CAD (coronary artery disease)     a. DES to OM 01/2005. b. Rotational atherotomy/DES to prox LAD 02/2008. c. Cath 07/2013: stable, for med rx.  . Edema   . Chronic renal insufficiency     Cr ~2.3.  . Warfarin anticoagulation     a. Followed by PCP.  Marland Kitchen GI bleed     a. EGD 01/2007: antral ulcers related to NSAID/aspirin use.  . Polycystic kidney disease     a. Per remote E-Chart records.  . Peripheral vascular disease   . Hyperthyroidism     a. Hx of intolerance to PTU.  Marland Kitchen Blood transfusion     no abnormal reaction  . H/O hiatal hernia   . Umbilical hernia     unrepaired (11/24/11)  . Arthritis   . Valvular heart disease     Echo 11/2011: mild-mod MR.  Marland Kitchen Hypertension     takes Amlodipine,Hydralazine,and Imdur daily  . Hyperlipidemia     takes Lovastatin daily  . Anemia, unspecified     takes an Iron Pill daily  . SSS (sick sinus syndrome)     a. H/o AV node ablation 2008 secondary to difficult to control afib/flutter, with subsequent pacer.  . Atrial fibrillation with rapid ventricular response     a. H/o amiodarone thyroiditis. b. s/p AV node ablation 2008 with implantation of PPM 01/2007, upgraded to CRT-P 06/2007.;takes Coumadin daily  . GERD (gastroesophageal reflux disease)     takes Protonix daily  . Chronic diastolic CHF (congestive heart failure)     takes Lasix daily and Spironolactone   . Myocardial infarction 1996  .  Pacemaker     [employer]  . Migraines     last migraine has been a while but headaches dail  . Chronic back pain     deteriorating disc  . History of gout   . Bruises easily   . Hemorrhoids   . History of kidney stones   . Depression     takes Xanax daily  . Insomnia     takes Xanax if needed    Past Surgical History  Procedure Laterality Date  . Esophagogastroduodenoscopy    . Tonsillectomy  ~ 1960  . Cardiac catheterization  2006; 2008; 2009; 01/13/2011  . Back surgery    . Laminectomy and microdiscectomy lumbar spine  08/2003  . Cardioversion  10/2004; 02/2007    /E-chart  . Cholecystectomy    . Appendectomy  1954  . Vaginal hysterectomy  ?1981  . Av node ablation  02/2007    /E-chart  . Insert / replace / remove pacemaker  01/2007    initial placement  . Insert / replace / remove pacemaker  06/2007    upgraded/E-chart  . Av fistula placement, brachiocephalic  09/2007    right; "didn't work; my veins were too small"  . Joint replacement    . Total knee arthroplasty  01/2009    left  . Bladder and bowel  1970's    "tacked; damaged my intestines & had to remove some; done @ [facility]  . Coronary angioplasty  2    percutanous transluminal   . Bilateral cataract surgery    . Bascilic vein transposition Right 06/21/2013    Procedure: BASCILIC VEIN TRANSPOSITION 1ST STAGE;  Surgeon: Nada Libman, MD;  Location: [facility] OR;  Service: Vascular;  Laterality: Right;    Allergies  Allergies  Allergen Reactions  . Amiodarone Hives  . Propylthiouracil Rash   HPI  78 year old female with the above complex problem list. She has some degree of chronic dyspnea exertion over the past several weeks, has been experiencing intermittent dysphagia with nausea and vomiting. She has also had progression of her dyspnea with substernal chest discomfort for which she was admitted on November 30. During that admission, she ruled out for myocardial infarction and after her INR came into the  subtherapeutic range, and adequate hydration, she underwent diagnostic cardiac catheterization on December 1, which showed stable coronary anatomy with patent LAD and obtuse marginal stents. Post catheterization, a sodium bicarbonate infusion was used for 6 hours and creatinine was 2.66 on the morning of December 2 (3.01 on admission). She was discharged home on PPI therapy as it was felt that her pain was most likely GI in nature, and advised to followup with primary care and GI. Following discharge, she felt reasonably well and did not have recurrence of chest pain or significant dyspnea. She weighs herself on a regular basis at home and says that her weight was 202 pounds on the evening of her discharge but by the morning of December 3, her weight climbed to 8. Notably, her weight was measured at 210-211 pounds during her entire admission here. In the setting of weight gain at home, she has also noticed some abdominal bloating.  This morning, she awoke to use the bathroom and after returning to her bedside, she took a sip of water. She noted significant trouble getting this down and subsequently developed throat and chest pain that radiated up to her jaw and ears. This persisted and then she also noted dyspnea as well. As a result, she asked her son to call 911 and EMS arrived and brought [facility] to [facility]. Here, her ECG is nonacute he changed and troponin is normal. She continues to complain of throat discomfort as well as abdominal bloating. While here, she ambulates use the bedside commode and felt short of breath. Chest x-ray shows bibasilar atelectasis. BNP is markedly elevated at 14,180. Her BNP last week was 412. Her creatinine is 3.0. We have been asked to evaluate.  Home Medications  Prior to Admission medications   Medication Sig Start Date End Date Taking? Authorizing Provider  ALPRAZolam Prudy Feeler) 0.5 MG tablet Take 0.5 mg by mouth 3 (three) times daily as needed.    Yes Historical Provider, MD    amLODipine (NORVASC) 5 MG tablet Take 1 tablet (5 mg total) by mouth daily. 07/09/13  Yes Beatrice Lecher, PA-C  aspirin EC 81 MG tablet Take 81 mg by mouth daily.   Yes Historical Provider, MD  ferrous sulfate 325 (65 FE) MG tablet Take 325 mg by mouth daily with breakfast.    Yes Historical Provider, MD  furosemide (LASIX) 40 MG tablet Take 40 mg by mouth daily.    Yes Historical Provider, MD  hydrALAZINE (APRESOLINE) 25 MG tablet Take 1 tablet (25 mg total)  by mouth 3 (three) times daily. 03/27/13  Yes Rosalio Macadamia, NP  isosorbide mononitrate (IMDUR) 60 MG 24 hr tablet Take 60 mg by mouth 2 (two) times daily. 07/01/13  Yes Scott Moishe Spice, PA-C  lovastatin (MEVACOR) 10 MG tablet Take 10 mg by mouth at bedtime.    Yes Historical Provider, MD  magnesium oxide (MAG-OX) 400 MG tablet Take 400 mg by mouth at bedtime.    Yes Historical Provider, MD  metoprolol tartrate (LOPRESSOR) 25 MG tablet Take 3 tablets (75 mg total) by mouth 3 (three) times daily. 07/01/13  Yes Beatrice Lecher, PA-C  nitroGLYCERIN (NITROSTAT) 0.4 MG SL tablet Place 0.4 mg under the tongue every 5 (five) minutes as needed for chest pain.   Yes Historical Provider, MD  oxyCODONE-acetaminophen (PERCOCET/ROXICET) 5-325 MG per tablet Take 1 tablet by mouth 3 (three) times daily as needed for moderate pain. For pain 06/21/13  Yes Suzanne L Nickel, NP  pantoprazole (PROTONIX) 40 MG tablet Take 40 mg by mouth 2 (two) times daily.    Yes Historical Provider, MD  paricalcitol (ZEMPLAR) 1 MCG capsule Take 1 mcg by mouth at bedtime.   Yes Historical Provider, MD  tetrahydrozoline 0.05 % ophthalmic solution Place 1 drop into both eyes 2 (two) times daily as needed (reddness).   Yes Historical Provider, MD  warfarin (COUMADIN) 2 MG tablet Pt takes  1 tablet (2mg ) on Tuesday, Wednesday, Thursday, Saturday, Sunday, and 1.5 tablets (3mg ) on Monday and Friday. 07/16/13  Yes Dayna N Dunn, PA-C   Family History  Family History  Problem Relation Age  of Onset  . Colon cancer Neg Hx   . Thyroid disease Neg Hx   . Stroke Brother   . Hypertension Brother   . Kidney disease Brother   . Other Mother     Sepsis and perforated colon  . Other Father     Motor vehicle accident   Social History  History   Social History  . Marital Status: Widowed    Spouse Name: N/A    Number of Children: N/A  . Years of Education: N/A   Occupational History  . Not on file.   Social History Main Topics  . Smoking status: Former Smoker -- 1.00 packs/day for 50 years    Types: Cigarettes    Quit date: 07/13/1995  . Smokeless tobacco: Never Used     Comment: quit smoking around 1996  . Alcohol Use: No  . Drug Use: No  . Sexual Activity: No   Other Topics Concern  . Not on file   Social History Narrative   Lives in [place].    Review of Systems General:  No chills, fever, night sweats or weight changes.  Cardiovascular:  Positive chest pain in setting of dysphasia, positive dyspnea on exertion, positive mild lower extremity edema, positive abdominal bloating, no orthopnea, palpitations, paroxysmal nocturnal dyspnea. Dermatological: No rash, lesions/masses Respiratory: No cough, positive dyspnea Urologic: No hematuria, dysuria Abdominal:   Positive dysphasia. Positive nausea and vomiting. She says she cannot keep food down. No diarrhea, bright red blood per rectum, melena, or hematemesis Neurologic:  No visual changes, wkns, changes in mental status. All other systems reviewed and are otherwise negative except as noted above.  Physical Exam  Blood pressure 131/67, pulse 60, temperature 98 F (36.7 C), temperature source Oral, resp. rate 16, height 5\' 7"  (1.702 m), weight 210 lb 6 oz (95.425 kg), SpO2 97.00%.  General: Pleasant, NAD Psych: Normal affect. Neuro: Alert and  oriented X 3. Moves all extremities spontaneously. HEENT: Normal  Neck: Supple without bruits, JVD to jaw. Lungs:  Resp regular and unlabored, bibasilar  crackles. Heart: RRR no s3, s4, 2/6 systolic murmur loudest at the left upper sternal border but heard throughout. Abdomen: Soft, non-tender, non-distended, BS + x 4.  Extremities: No clubbing, cyanosis. Trace bilateral lower extremity edema. DP/PT/Radials 2+ and equal bilaterally.  Labs   Recent Labs  07/18/13 1137  TROPONINI <0.30   Lab Results  Component Value Date   WBC 8.0 07/18/2013   HGB 10.5* 07/18/2013   HCT 31.0* 07/18/2013   MCV 89.1 07/18/2013   PLT 164 07/18/2013     Recent Labs Lab 07/18/13 1134  NA 137  K 3.5  CL 97  CO2 27  BUN 37*  CREATININE 3.00*  CALCIUM 9.3  GLUCOSE 115*   Lab Results  Component Value Date   INR 1.12 07/16/2013   INR 1.27 07/14/2013   INR 2.2* 07/09/2013   Radiology/Studies  Dg Chest 2 View  07/18/2013   CLINICAL DATA:  Dysphasia  EXAM: CHEST  2 VIEW  COMPARISON:  07/09/2013  FINDINGS: Mild cardiac enlargement.  Pacemaker leads are unchanged.  Hypoventilation with bibasilar atelectasis, increased from the prior study. Negative for heart failure or effusion.  IMPRESSION: Hypoventilation with bibasilar atelectasis.   Electronically Signed   By: Marlan Palau M.D.   On: 07/18/2013 12:15   ECG  V. paced at 60 with underlying atrial fibrillation, PVC.  ASSESSMENT AND PLAN  1. Acute on chronic diastolic congestive heart failure: Patient presents with increasing abdominal bloating, dyspnea exertion, and weight gain following recent catheterization surrounding which, she was hydrated in an effort to prevent worsening renal failure. She does have volume overload on exam and proBNP is markedly elevated. She will require intravenous diuresis and close monitoring of her creatinine. Heart rate and blood pressure stable. Continue home meds.    2. Dysphagia/nausea and vomiting: Patient reports that this is been going on for several weeks and acutely worsened this morning. This is the primary reason she presented to the emergency department today.  She was started on PPI therapy at discharge on December 2, as it was felt that her chest pain was noncardiac and most likely GI in origin. She was advised to followup with gastroenterology as an outpatient at that time. In the setting of acute recurrent dysphasia and associated pain, she would likely benefit from an inpatient gastroenterology evaluation.  3. Stage IV chronic kidney disease: Creatinine is stable at 3.0. Follow closely with diuresis.  4. Coronary artery disease: Status post prior LAD and obtuse marginal stenting. Catheterization earlier this week showed patency of these areas and otherwise nonobstructive disease. No further ischemic workup of chest pain planned. Continue home medicines.  5. Chronic atrial fibrillation: Status post AV nodal ablation and pacemaker placement. She is on chronic Coumadin.  Signed, Nicolasa Ducking, NP 07/18/2013, 2:33 PM Seen in ER. She is in no acute distress but does have significant signs of fluid overload with elevated JVP, bilateral rales at bases, abdominal fullness and trace pedal edema.EKG was reviewed and shows atrial fibrillation and paced ventricular rhythm.  Her renal function is back to her baseline levels. Her main complaint is her difficulty with swallowing. Agree with plans for IV diuresis and with inpatient GI evaluation.

## 2013-07-18 NOTE — H&P (Signed)
[facility]                                                                                    Patient Demographics  [name], is a 78 y.o. female  MRN: [id number]   DOB - 11-Nov-1934  Admit Date - 07/18/2013  Outpatient Primary MD for the patient is [name], MD   With History of -  Past Medical History  Diagnosis Date  . Cardiac pacemaker in situ   . Cellulitis and abscess of unspecified site     a. h/o MSRA cellulitis of abdominal wall.  Marland Kitchen CAD (coronary artery disease)     a. DES to OM 01/2005. b. Rotational atherotomy/DES to prox LAD 02/2008. c. Cath 07/2013: stable, for med rx.  . Edema   . Chronic renal insufficiency     Cr ~2.3.  . Warfarin anticoagulation     a. Followed by PCP.  Marland Kitchen GI bleed     a. EGD 01/2007: antral ulcers related to NSAID/aspirin use.  . Polycystic kidney disease     a. Per remote E-Chart records.  . Peripheral vascular disease   . Hyperthyroidism     a. Hx of intolerance to PTU.  Marland Kitchen Blood transfusion     no abnormal reaction  . H/O hiatal hernia   . Umbilical hernia     unrepaired (11/24/11)  . Arthritis   . Valvular heart disease     Echo 11/2011: mild-mod MR.  Marland Kitchen Hypertension     takes Amlodipine,Hydralazine,and Imdur daily  . Hyperlipidemia     takes Lovastatin daily  . Anemia, unspecified     takes an Iron Pill daily  . SSS (sick sinus syndrome)     a. H/o AV node ablation 2008 secondary to difficult to control afib/flutter, with subsequent pacer.  . Atrial fibrillation with rapid ventricular response     a. H/o amiodarone thyroiditis. b. s/p AV node ablation 2008 with implantation of PPM 01/2007, upgraded to CRT-P 06/2007.;takes Coumadin daily  . GERD (gastroesophageal reflux disease)     takes Protonix daily  . Chronic diastolic CHF (congestive heart failure)     takes Lasix daily and Spironolactone   . Myocardial infarction 1996  . Pacemaker     [employer]tronic  . Migraines     last migraine has been a while but headaches  dail  . Chronic back pain     deteriorating disc  . History of gout   . Bruises easily   . Hemorrhoids   . History of kidney stones   . Depression     takes Xanax daily  . Insomnia     takes Xanax if needed      Past Surgical History  Procedure Laterality Date  . Esophagogastroduodenoscopy    . Tonsillectomy  ~ 1960  . Cardiac catheterization  2006; 2008; 2009; 01/13/2011  . Back surgery    . Laminectomy and microdiscectomy lumbar spine  08/2003  . Cardioversion  10/2004; 02/2007    /E-chart  . Cholecystectomy    . Appendectomy  1954  . Vaginal hysterectomy  ?1981  . Av node ablation  02/2007    /E-chart  .  Insert / replace / remove pacemaker  01/2007    initial placement  . Insert / replace / remove pacemaker  06/2007    upgraded/E-chart  . Av fistula placement, brachiocephalic  09/2007    right; "didn't work; my veins were too small"  . Joint replacement    . Total knee arthroplasty  01/2009    left  . Bladder and bowel  1970's    "tacked; damaged my intestines & had to remove some; done @ [facility]  . Coronary angioplasty  2    percutanous transluminal   . Bilateral cataract surgery    . Bascilic vein transposition Right 06/21/2013    Procedure: BASCILIC VEIN TRANSPOSITION 1ST STAGE;  Surgeon: Nada Libman, MD;  Location: [facility] OR;  Service: Vascular;  Laterality: Right;    in for   Chief Complaint  Patient presents with  . Dysphagia     HPI  [name]  is a 78 y.o. female,  atrial fibrillation on Coumadin at home, dysphagia for the last 1 year, recent admission for chest pain which was thought to be GI in nature requiring hospitalization by cardiology team discharged on 07-16-2013 underwent left heart catheterization, chronic diastolic dysfunction, morbid obesity, dyslipidemia, nonocclusive CAD, status post pacemaker placement. Patient with above history who was discharged few days ago from the cardiology service for a unremarkable chest pain workup, rubs back  to the ER after she has noticed a gradual progression in her chronic dysphagia to the point that yesterday evening she was then able to swallow her pills in even water, when she tried to drink a sip of water last night she started developing throat and chest discomfort, she was unable to eat or drink anything this morning and came to the ER.    In the ER besides her other routine workup appropriate was checked which was elevated, cardiology was called, she was found to be in some fluid overload which was thought to be due to her IV fluid administration which happened last admission while she was undergoing left heart catheterization. She received IV Lasix, she was in no respiratory distress I was called to admit the patient for dysphagia workup.    Review of Systems    In addition to the HPI above,   No Fever-chills, No Headache, No changes with Vision or hearing, ++ problems swallowing food or Liquids, No Chest pain, Cough or Shortness of Breath, No Abdominal pain, No Nausea or Vommitting, Bowel movements are regular, No Blood in stool or Urine, No dysuria, No new skin rashes or bruises, No new joints pains-aches,  No new weakness, tingling, numbness in any extremity, No recent weight gain or loss, No polyuria, polydypsia or polyphagia, No significant Mental Stressors.  A full 10 point Review of Systems was done, except as stated above, all other Review of Systems were negative.   Social History History  Substance Use Topics  . Smoking status: Former Smoker -- 1.00 packs/day for 50 years    Types: Cigarettes    Quit date: 07/13/1995  . Smokeless tobacco: Never Used     Comment: quit smoking around 1996  . Alcohol Use: No      Family History Family History  Problem Relation Age of Onset  . Colon cancer Neg Hx   . Thyroid disease Neg Hx   . Stroke Brother   . Hypertension Brother   . Kidney disease Brother   . Other Mother     Sepsis and perforated colon  .  Other  Father     Motor vehicle accident      Prior to Admission medications   Medication Sig Start Date End Date Taking? Authorizing Provider  ALPRAZolam Prudy Feeler) 0.5 MG tablet Take 0.5 mg by mouth 3 (three) times daily as needed.    Yes Historical Provider, MD  amLODipine (NORVASC) 5 MG tablet Take 1 tablet (5 mg total) by mouth daily. 07/09/13  Yes Beatrice Lecher, PA-C  aspirin EC 81 MG tablet Take 81 mg by mouth daily.   Yes Historical Provider, MD  ferrous sulfate 325 (65 FE) MG tablet Take 325 mg by mouth daily with breakfast.    Yes Historical Provider, MD  furosemide (LASIX) 40 MG tablet Take 40 mg by mouth daily.    Yes Historical Provider, MD  hydrALAZINE (APRESOLINE) 25 MG tablet Take 1 tablet (25 mg total) by mouth 3 (three) times daily. 03/27/13  Yes Rosalio Macadamia, NP  isosorbide mononitrate (IMDUR) 60 MG 24 hr tablet Take 60 mg by mouth 2 (two) times daily. 07/01/13  Yes Scott Moishe Spice, PA-C  lovastatin (MEVACOR) 10 MG tablet Take 10 mg by mouth at bedtime.    Yes Historical Provider, MD  magnesium oxide (MAG-OX) 400 MG tablet Take 400 mg by mouth at bedtime.    Yes Historical Provider, MD  metoprolol tartrate (LOPRESSOR) 25 MG tablet Take 3 tablets (75 mg total) by mouth 3 (three) times daily. 07/01/13  Yes Beatrice Lecher, PA-C  nitroGLYCERIN (NITROSTAT) 0.4 MG SL tablet Place 0.4 mg under the tongue every 5 (five) minutes as needed for chest pain.   Yes Historical Provider, MD  oxyCODONE-acetaminophen (PERCOCET/ROXICET) 5-325 MG per tablet Take 1 tablet by mouth 3 (three) times daily as needed for moderate pain. For pain 06/21/13  Yes Suzanne L Nickel, NP  pantoprazole (PROTONIX) 40 MG tablet Take 40 mg by mouth 2 (two) times daily.    Yes Historical Provider, MD  paricalcitol (ZEMPLAR) 1 MCG capsule Take 1 mcg by mouth at bedtime.   Yes Historical Provider, MD  tetrahydrozoline 0.05 % ophthalmic solution Place 1 drop into both eyes 2 (two) times daily as needed (reddness).   Yes  Historical Provider, MD  warfarin (COUMADIN) 2 MG tablet Pt takes  1 tablet (2mg ) on Tuesday, Wednesday, Thursday, Saturday, Sunday, and 1.5 tablets (3mg ) on Monday and Friday. 07/16/13  Yes Dayna N Dunn, PA-C    Allergies  Allergen Reactions  . Amiodarone Hives  . Propylthiouracil Rash    Physical Exam  Vitals  Blood pressure 129/60, pulse 60, temperature 98 F (36.7 C), temperature source Oral, resp. rate 18, height 5\' 7"  (1.702 m), weight 95.425 kg (210 lb 6 oz), SpO2 97.00%.   1. General obese elderly white female lying in bed in NAD,    2. Normal affect and insight, Not Suicidal or Homicidal, Awake Alert, Oriented X 3.  3. No F.N deficits, ALL C.Nerves Intact, Strength 5/5 all 4 extremities, Sensation intact all 4 extremities, Plantars down going.  4. Ears and Eyes appear Normal, Conjunctivae clear, PERRLA. Moist Oral Mucosa.  5. Supple Neck, No JVD, No cervical lymphadenopathy appriciated, No Carotid Bruits.  6. Symmetrical Chest wall movement, Good air movement bilaterally, few rales  7. RRR, No Gallops, Rubs or Murmurs, No Parasternal Heave.  8. Positive Bowel Sounds, Abdomen Soft, Non tender, No organomegaly appriciated,No rebound -guarding or rigidity.  9.  No Cyanosis, Normal Skin Turgor, No Skin Rash or Bruise. 1+ leg edema  10. Good muscle  tone,  joints appear normal , no effusions, Normal ROM.  11. No Palpable Lymph Nodes in Neck or Axillae     Data Review  CBC  Recent Labs Lab 07/15/13 0240 07/18/13 1134  WBC 4.5 8.0  HGB 10.0* 10.5*  HCT 28.8* 31.0*  PLT 158 164  MCV 87.0 89.1  MCH 30.2 30.2  MCHC 34.7 33.9  RDW 14.8 15.3  LYMPHSABS  --  0.5*  MONOABS  --  0.7  EOSABS  --  0.0  BASOSABS  --  0.0   ------------------------------------------------------------------------------------------------------------------  Chemistries   Recent Labs Lab 07/14/13 1530 07/15/13 0240 07/16/13 0708 07/18/13 1134  NA 129* 131* 134* 137  K 4.3 3.5  4.9 3.5  CL 95* 96 97 97  CO2 22 22 25 27   GLUCOSE 97 99 104* 115*  BUN 38* 38* 35* 37*  CREATININE 3.01* 2.84* 2.66* 3.00*  CALCIUM 9.0 8.8 9.3 9.3   ------------------------------------------------------------------------------------------------------------------ estimated creatinine clearance is 18.9 ml/min (by C-G formula based on Cr of 3). ------------------------------------------------------------------------------------------------------------------ No results found for this basename: TSH, T4TOTAL, FREET3, T3FREE, THYROIDAB,  in the last 72 hours   Coagulation profile  Recent Labs Lab 07/14/13 1455 07/16/13 0830  INR 1.27 1.12   ------------------------------------------------------------------------------------------------------------------- No results found for this basename: DDIMER,  in the last 72 hours -------------------------------------------------------------------------------------------------------------------  Cardiac Enzymes  Recent Labs Lab 07/18/13 1137  TROPONINI <0.30   ------------------------------------------------------------------------------------------------------------------ No components found with this basename: POCBNP,    ---------------------------------------------------------------------------------------------------------------  Urinalysis    Component Value Date/Time   COLORURINE YELLOW 07/18/2013 1134   APPEARANCEUR CLEAR 07/18/2013 1134   LABSPEC 1.010 07/18/2013 1134   PHURINE 6.0 07/18/2013 1134   GLUCOSEU NEGATIVE 07/18/2013 1134   HGBUR NEGATIVE 07/18/2013 1134   BILIRUBINUR NEGATIVE 07/18/2013 1134   KETONESUR NEGATIVE 07/18/2013 1134   PROTEINUR NEGATIVE 07/18/2013 1134   UROBILINOGEN 0.2 07/18/2013 1134   NITRITE NEGATIVE 07/18/2013 1134   LEUKOCYTESUR NEGATIVE 07/18/2013 1134    ----------------------------------------------------------------------------------------------------------------  Imaging results:   Dg Chest  2 View  07/18/2013   CLINICAL DATA:  Dysphasia  EXAM: CHEST  2 VIEW  COMPARISON:  07/09/2013  FINDINGS: Mild cardiac enlargement.  Pacemaker leads are unchanged.  Hypoventilation with bibasilar atelectasis, increased from the prior study. Negative for heart failure or effusion.  IMPRESSION: Hypoventilation with bibasilar atelectasis.   Electronically Signed   By: Marlan Palau M.D.   On: 07/18/2013 12:15      My personal review of EKG: Rhythm paced 60/hr    Assessment & Plan    1. Dysphagia acute on chronic but now severe unable to swallow liquids. She has history of dysphagia ulcers per EGD by Dr. Christella Hartigan several years ago, she was on aspirin and Coumadin both of which will be held, will admit her on clear liquids if she can swallow, being swallow and speech evaluation, have requested [facility] to evaluate her for possible EGD. She denies any unexpected weight loss, hematemesis melena or blood in stool. Which to IV PPI for now.   2. Acute on chronic diastolic CHF. IV Lasix dosed by cardiology, she has been seen by cardiology already. Clinically she has no shortness of breath. Monitor Lasix dose closely . Recent echo shows EF of greater than 55%.    3. Atrial fibrillation, bradycardia syndrome requiring pacemaker. Continue low-dose beta blocker as tolerated, telemetry monitoring, hold aspirin and Coumadin for now.    4. Chronic kidney disease stage IV. Baseline creatinine is around 3, she is at her baseline.  5. Dyslipidemia continue home dose statin.     6. HTN -  home dose hydralazine and beta blocker will be continued, will add when necessary IV Lopressor and hydralazine in case she is unable to take oral medications.      DVT Prophylaxis Heparin  - SCDs    AM Labs Ordered, also please review Full Orders  Family Communication: Admission, patients condition and plan of care including tests being ordered have been discussed with the patient and family who indicate  understanding and agree with the plan and Code Status.  Code Status full  Likely DC to  Home  Condition GUARDED   Time spent in minutes : 35    Viney Acocella K M.D on 07/18/2013 at 3:39 PM  Between 7am to 7pm - Pager - [phone]  After 7pm go to [email or web address] - password [email or web address]  And look for the night coverage person covering me after hours  [facility] Office  [phone]

## 2013-07-19 ENCOUNTER — Encounter (HOSPITAL_COMMUNITY)
Admission: EM | Disposition: A | Discharge status: Home / Self Care | Payer: Self-pay | Source: Home / Self Care | Attending: Internal Medicine

## 2013-07-19 ENCOUNTER — Encounter (HOSPITAL_COMMUNITY): Payer: Self-pay | Admitting: *Deleted

## 2013-07-19 DIAGNOSIS — K219 Gastro-esophageal reflux disease without esophagitis: Secondary | ICD-10-CM

## 2013-07-19 HISTORY — PX: ESOPHAGOGASTRODUODENOSCOPY: SHX5428

## 2013-07-19 HISTORY — PX: BALLOON DILATION: SHX5330

## 2013-07-19 LAB — CBC
HCT: 30.3 % — ABNORMAL LOW (ref 36.0–46.0)
Hemoglobin: 10.1 g/dL — ABNORMAL LOW (ref 12.0–15.0)
MCH: 30.1 pg (ref 26.0–34.0)
MCHC: 33.3 g/dL (ref 30.0–36.0)
MCV: 90.2 fL (ref 78.0–100.0)
Platelets: 148 10*3/uL — ABNORMAL LOW (ref 150–400)
RBC: 3.36 MIL/uL — ABNORMAL LOW (ref 3.87–5.11)
RDW: 15.3 % (ref 11.5–15.5)
WBC: 5.4 10*3/uL (ref 4.0–10.5)

## 2013-07-19 LAB — URINE CULTURE
Colony Count: NO GROWTH
Colony Count: NO GROWTH
Culture: NO GROWTH
Culture: NO GROWTH

## 2013-07-19 LAB — BASIC METABOLIC PANEL
BUN: 37 mg/dL — ABNORMAL HIGH (ref 6–23)
Chloride: 98 mEq/L (ref 96–112)
GFR calc Af Amer: 17 mL/min — ABNORMAL LOW (ref >90)
Potassium: 3 mEq/L — ABNORMAL LOW (ref 3.5–5.1)

## 2013-07-19 SURGERY — EGD (ESOPHAGOGASTRODUODENOSCOPY)
Anesthesia: Moderate Sedation

## 2013-07-19 MED ORDER — MIDAZOLAM HCL 5 MG/ML IJ SOLN
INTRAMUSCULAR | Status: AC
  Filled 2013-07-19: qty 2

## 2013-07-19 MED ORDER — PANTOPRAZOLE SODIUM 40 MG PO TBEC
40.0000 mg | DELAYED_RELEASE_TABLET | Freq: Two times a day (BID) | ORAL | Status: DC
  Administered 2013-07-19 – 2013-07-21 (×4): 40 mg via ORAL
  Filled 2013-07-19 (×2): qty 1

## 2013-07-19 MED ORDER — FENTANYL CITRATE 0.05 MG/ML IJ SOLN
INTRAMUSCULAR | Status: AC
  Filled 2013-07-19: qty 2

## 2013-07-19 MED ORDER — HEPARIN SODIUM (PORCINE) 5000 UNIT/ML IJ SOLN
5000.0000 [IU] | Freq: Three times a day (TID) | INTRAMUSCULAR | Status: AC
  Administered 2013-07-19: 5000 [IU] via SUBCUTANEOUS
  Filled 2013-07-19: qty 1

## 2013-07-19 MED ORDER — PANTOPRAZOLE SODIUM 40 MG PO TBEC: 40.0000 mg | DELAYED_RELEASE_TABLET | Freq: Two times a day (BID) | ORAL | Status: DC

## 2013-07-19 MED ORDER — POTASSIUM CHLORIDE CRYS ER 20 MEQ PO TBCR
40.0000 meq | EXTENDED_RELEASE_TABLET | Freq: Once | ORAL | Status: AC
  Administered 2013-07-19: 40 meq via ORAL
  Filled 2013-07-19: qty 2

## 2013-07-19 MED ORDER — BUTAMBEN-TETRACAINE-BENZOCAINE 2-2-14 % EX AERO
INHALATION_SPRAY | CUTANEOUS | Status: DC | PRN
  Administered 2013-07-19: 2 via TOPICAL

## 2013-07-19 MED ORDER — MIDAZOLAM HCL 10 MG/2ML IJ SOLN
INTRAMUSCULAR | Status: DC | PRN
  Administered 2013-07-19 (×2): 2 mg via INTRAVENOUS

## 2013-07-19 MED ORDER — FENTANYL CITRATE 0.05 MG/ML IJ SOLN
INTRAMUSCULAR | Status: DC | PRN
  Administered 2013-07-19 (×2): 25 ug via INTRAVENOUS

## 2013-07-19 NOTE — Progress Notes (Signed)
SLP Cancellation Note  Unable to complete SLP evaluation secondary to patient is NPO for EGD procedure scheduled for 1245 today (07/19/13). SLP to defer swallow evaluation until patient has recovered and cleared for PO intake following EGD. Suspect that patient will require objective swallow evaluation (MBSS or FEES), and that primary dysphagia appears to be esophageal-based.  Therapist: Angela Nevin, MA, CCC-SLP

## 2013-07-19 NOTE — Progress Notes (Signed)
Pt is set up for repeat EGD with removal of prepyloric polyp tomorrow, 07/20/13, at 10:15 AM.   Will hold subq Heparin after tonight's 2200 dose which will allow for [place] to remove polyp at lowest risk of GIB.

## 2013-07-19 NOTE — Op Note (Signed)
[facility] [address] [place], [address]   ENDOSCOPY PROCEDURE REPORT  PATIENT: [name], [name]  MR#: [id number] BIRTHDATE: 04-Jul-1935 , 78  yrs. old GENDER: Female ENDOSCOPIST: Hart Carwin, MD REFERRED BY:  Dr Demetrius Charity.Singh PROCEDURE DATE:  07/19/2013 PROCEDURE:  EGD, diagnostic and Savary dilation of esophagus ASA CLASS:     Class III INDICATIONS:  Dysphagia.   Oscar night the patient choked on liquids as well as solids she has a history of esophageal dysmotility based on previous barium esophagram. MEDICATIONS: These medications were titrated to patient response per physician's verbal order, Fentanyl 50 mcg IV, and Versed 4 mg IV TOPICAL ANESTHETIC: Cetacaine Spray  DESCRIPTION OF PROCEDURE: After the risks benefits and alternatives of the procedure were thoroughly explained, informed consent was obtained.  The Pentax Gastroscope [id number] endoscope was introduced through the mouth and advanced to the second portion of the duodenum. Without limitations.  The instrument was slowly withdrawn as the mucosa was fully examined.      Esophagus: esophageal lumen was torturous and there was some spasm. There was no stricture[  ,endoscope traversed into the stomach without resistance. There was no hiatal hernia. There was no food retained within the esophagus Stomach: Gastric mucosa was normal in the body of the stomach and gastric antrum. There was a large 15 mm pedunculated polyp at the entrance into the pyloric channel which was freely prolapsing through the pylorus he did not appear to be bleeding. The stalk was thick and long period the polyp was not removed because of anticoagulation with Coumadin and heparin. Retroflexion of the endoscope revealed normal fundus and cardia area Duodenum: Duodenal bulb and descending duodenum was normal Guidewire was placed through the endoscope into the stomach and 19 mm Savary dilator passed without  fluoroscopic guidance. There was no blood on the dilator. There was mild resistance       The scope was then withdrawn from the patient and the procedure completed.  COMPLICATIONS: There were no complications. ENDOSCOPIC IMPRESSION: presbyesophagus causing tortuosity and spasm in the esophagus. No evidence of esophageal stricture Status post passage of 19 mm Savary dilator Large prepyloric polyp not removed due to patient's anticoagulations  RECOMMENDATIONS  Resume diet Obtain modified barium with speech pathology. This could be done as an outpatient Antireflux measures continue PPI The gastric polyp will have to be removed:,while she has been off Coumadin and heparin. This also could be done as an outpatient the polyp could be source or focal GI blood loss. Polyp could also be partially obstructing the gastric outlet. We will discuss timing of this procedure with the Dr Thedore Mins REPEAT EXAM:  off Coumadin, possibly before D/C  eSigned:  Hart Carwin, MD 07/19/2013 1:30 PM   CC:  PATIENT NAME:  [name], [name] MR#: [id number]

## 2013-07-19 NOTE — Progress Notes (Signed)
[facility] PROGRESS NOTE  [name]:[id number] DOB: 10-27-1934 DOA: 07/18/2013 PCP: [name], MD   Brief narrative 78 y.o. female, with hx of atrial fibrillation on Coumadin at home, dysphagia for the last 1 year, recent admission for chest pain for which she  underwent left heart catheterization which was unremarkable and thought to be GI in nature, , chronic diastolic dysfunction, morbid obesity, dyslipidemia, nonocclusive CAD, status post pacemaker placement presented with  gradual progression in her chronic dysphagia from solids to liquids over past few months. In the ER she was found to be in volume overload and given IV lasix. Admitted for dysphagia w/up and acute CHF.   Assessment/Plan: Dysphagia acute on chronic  but now severe unable to swallow liquids. She has history of dysphagia ulcers per EGD by Dr. Christella Hartigan several years ago, she was on aspirin and Coumadin both of which are held .  appreciate GI consult. EGD done today shows  Large prepyloric ulcer which was not removed as patient on anticoagulation and plan to have it removed at a later date possibly this admission. -swallow eval  -pending barium swallow -currently NPO -continue PPI   Acute on chronic diastolic CHF.  On IV lasix. Symptoms improved.  Recent echo shows EF of greater than 55%.  Cardiology following   Atrial fibrillation, bradycardia  requiring pacemaker.  Monitor on tele Continue low-dose beta blocker as tolerated,   holding aspirin and Coumadin for now.   Chronic kidney disease stage IV.  Stable  at baseline creatinine is around 3  Dyslipidemia  continue home dose statin.   Marland Kitchen HTN  continue home dose metoprolol and hydralazine  DVT Prophylaxis Heparin - SCDs    Code Status: Full code Family Communication: none at bedside Disposition Plan: home likely in 1-2 days   Consultants:  [facility]  [facility]  Procedures:  EGD on  12/5  Antibiotics:  none  HPI/Subjective: Feels SOB to be better. EGD today  Objective: Filed Vitals:   07/19/13 1335  BP:   Pulse: 59  Temp:   Resp: 25    Intake/Output Summary (Last 24 hours) at 07/19/13 1427 Last data filed at 07/19/13 1357  Gross per 24 hour  Intake 479.33 ml  Output   1350 ml  Net -870.67 ml   Filed Weights   07/18/13 0854 07/18/13 1715 07/18/13 1951  Weight: 95.425 kg (210 lb 6 oz) 95.1 kg (209 lb 10.5 oz) 94.167 kg (207 lb 9.6 oz)    Exam:   General:  Elderly female in NAD   HEENT: no pallor, moist oral mucosa   chest : clear b/l, no added sounds  CVS: NS1&S2, no murmurs, rubs or gallop   abd: soft, NT, ND, BS+  Ext: warm trace edema   CNS: AAAOX3   Data Reviewed: Basic Metabolic Panel:  Recent Labs Lab 07/14/13 1530 07/15/13 0240 07/16/13 0708 07/18/13 1134 07/19/13 0525  NA 129* 131* 134* 137 138  K 4.3 3.5 4.9 3.5 3.0*  CL 95* 96 97 97 98  CO2 22 22 25 27 27   GLUCOSE 97 99 104* 115* 107*  BUN 38* 38* 35* 37* 37*  CREATININE 3.01* 2.84* 2.66* 3.00* 2.88*  CALCIUM 9.0 8.8 9.3 9.3 9.2   Liver Function Tests: No results found for this basename: AST, ALT, ALKPHOS, BILITOT, PROT, ALBUMIN,  in the last 168 hours No results found for this basename: LIPASE, AMYLASE,  in the last 168 hours No results found for this basename: AMMONIA,  in  the last 168 hours CBC:  Recent Labs Lab 07/15/13 0240 07/18/13 1134 07/19/13 0525  WBC 4.5 8.0 5.4  NEUTROABS  --  6.8  --   HGB 10.0* 10.5* 10.1*  HCT 28.8* 31.0* 30.3*  MCV 87.0 89.1 90.2  PLT 158 164 148*   Cardiac Enzymes:  Recent Labs Lab 07/18/13 1137  TROPONINI <0.30   BNP (last 3 results)  Recent Labs  03/27/13 1218 07/09/13 1701 07/18/13 1137  PROBNP 288.0* 412.0* 14148.0*   CBG: No results found for this basename: GLUCAP,  in the last 168 hours  Recent Results (from the past 240 hour(s))  MRSA PCR SCREENING     Status: None   Collection Time     07/14/13  1:26 PM      Result Value Range Status   MRSA by PCR NEGATIVE  NEGATIVE Final   Comment:            The GeneXpert MRSA Assay (FDA     approved for NASAL specimens     only), is one component of a     comprehensive MRSA colonization     surveillance program. It is not     intended to diagnose MRSA     infection nor to guide or     monitor treatment for     MRSA infections.     Studies: Dg Chest 2 View  07/18/2013   CLINICAL DATA:  Dysphasia  EXAM: CHEST  2 VIEW  COMPARISON:  07/09/2013  FINDINGS: Mild cardiac enlargement.  Pacemaker leads are unchanged.  Hypoventilation with bibasilar atelectasis, increased from the prior study. Negative for heart failure or effusion.  IMPRESSION: Hypoventilation with bibasilar atelectasis.   Electronically Signed   By: Marlan Palau M.D.   On: 07/18/2013 12:15    Scheduled Meds: . amLODipine  5 mg Oral Daily  . furosemide  80 mg Intravenous BID  . heparin  5,000 Units Subcutaneous Q8H  . hydrALAZINE  25 mg Oral TID  . isosorbide mononitrate  60 mg Oral BID  . magnesium oxide  400 mg Oral QHS  . metoprolol tartrate  75 mg Oral TID  . pantoprazole  40 mg Oral BID  . simvastatin  10 mg Oral q1800  . sodium chloride  3 mL Intravenous Q12H   Continuous Infusions: . dextrose 5 % and 0.45% NaCl 35 mL/hr at 07/18/13 1944     Time spent: 25 minutes    [name]  [facility] Pager [phone]. If 7PM-7AM, please contact night-coverage at [email or web address], password [facility] 07/19/2013, 2:27 PM  LOS: 1 day

## 2013-07-19 NOTE — Progress Notes (Signed)
          Daily Rounding Note  07/19/2013, 8:38 AM  LOS: 1 day   SUBJECTIVE:       Still with some chest pain and had wretching last night, nothing came up.  Slept well.   OBJECTIVE:         Vital signs in last 24 hours:    Temp:  [97.7 F (36.5 C)-98.2 F (36.8 C)] 98.1 F (36.7 C) (12/05 0627) Pulse Rate:  [59-66] 62 (12/05 0627) Resp:  [16-24] 17 (12/05 0627) BP: (128-139)/(53-83) 134/60 mmHg (12/05 0627) SpO2:  [96 %-100 %] 99 % (12/05 0627) Weight:  [94.167 kg (207 lb 9.6 oz)-95.425 kg (210 lb 6 oz)] 94.167 kg (207 lb 9.6 oz) (12/04 1951) Last BM Date: 07/15/13 General: obese, chronically unwell looking   Heart: RRR.  No MRG Chest: clear bil.  No dyspnea or cough Abdomen: obese, active BS, soft.   Extremities: no edema Neuro/Psych:  Pleasant, appropriate, no confusion.  No asymmetric weakness.   Intake/Output from previous day: 12/04 0701 - 12/05 0700 In: 479.3 [P.O.:120; I.V.:359.3] Out: 600 [Urine:600]  Intake/Output this shift:    Lab Results:  Recent Labs  07/18/13 1134 07/19/13 0525  WBC 8.0 5.4  HGB 10.5* 10.1*  HCT 31.0* 30.3*  PLT 164 148*   BMET  Recent Labs  07/18/13 1134 07/19/13 0525  NA 137 138  K 3.5 3.0*  CL 97 98  CO2 27 27  GLUCOSE 115* 107*  BUN 37* 37*  CREATININE 3.00* 2.88*  CALCIUM 9.3 9.2   PT/INR  Recent Labs  07/18/13 1621  LABPROT 17.0*  INR 1.42     ASSESMENT:   * Dysphagia and associated chest pain. Acute on chronic. Rule out dysmotility vs stricture.  Rule out candida esophagitis.  GERD acid injury less likely given twice daily Protonix at home.   * Dyspnea, improved * CHF. pBNP  14148, no overt failure on xray.  * Chronic stage 4 kidney disease.  * Chronic normocytic anemia. On chronic po Iron.  * Chronic A fib. Chronic Coumadin, held yesterday. INR subtherapeutic.  * Hx GIB from NSAID ulcers. On BID Protonix and no longer uses NSAIDS     PLAN   *   EGD with possible dilatation of esophagus today.     [name]  07/19/2013, 8:38 AM Pager: [phone] Attending MD note:   I have taken a history, examined the patient, and reviewed the chart. I agree with the Advanced Practitioner's impression and recommendations. Please see EGD report- NO evidence of esoph.stricture, ?? Spasm, tortuous lumen- suspect presbyesophagus. Passed 19 mm large diameter Savary  dilator. She will need a MBS with Speech Path. Also found large pedunculated polyp at the pylorus, prolapsing through the pylorus- not removed due to INR 1.4 and subX Heparin, will have to be removed while off Coumadin. Could be done as an outpatient. Also need Speech Path- also could be done as an outpatient.  [facility] Pager # [phone]

## 2013-07-19 NOTE — Progress Notes (Signed)
Patient ID: [name], female   DOB: August 29, 1934, 78 y.o.   MRN: [id number]    Subjective:  Denies SSCP, palpitations or Dyspnea Still difficulty swallowing   Objective:  Filed Vitals:   07/18/13 1500 07/18/13 1715 07/18/13 1951 07/19/13 0627  BP: 129/60 139/60 130/53 134/60  Pulse: 60 62 59 62  Temp:  98 F (36.7 C) 98.2 F (36.8 C) 98.1 F (36.7 C)  TempSrc:  Oral Oral Oral  Resp: 18 18 17 17   Height:      Weight:  209 lb 10.5 oz (95.1 kg) 207 lb 9.6 oz (94.167 kg)   SpO2: 96% 98% 98% 99%    Intake/Output from previous day:  Intake/Output Summary (Last 24 hours) at 07/19/13 0815 Last data filed at 07/19/13 0600  Gross per 24 hour  Intake 479.33 ml  Output    600 ml  Net -120.67 ml    Physical Exam: Affect appropriate Obese white female  HEENT: normal Neck supple with no adenopathy JVP normal no bruits no thyromegaly Lungs clear with no wheezing and good diaphragmatic motion Heart:  S1/S2 no murmur, no rub, gallop or click PMI normal Abdomen: benighn, BS positve, no tenderness, no AAA no bruit.  No HSM or HJR Distal pulses intact with no bruits No edema Neuro non-focal Skin warm and dry No muscular weakness   Lab Results: Basic Metabolic Panel:  Recent Labs  40/98/11 1134 07/19/13 0525  NA 137 138  K 3.5 3.0*  CL 97 98  CO2 27 27  GLUCOSE 115* 107*  BUN 37* 37*  CREATININE 3.00* 2.88*  CALCIUM 9.3 9.2   CBC:  Recent Labs  07/18/13 1134 07/19/13 0525  WBC 8.0 5.4  NEUTROABS 6.8  --   HGB 10.5* 10.1*  HCT 31.0* 30.3*  MCV 89.1 90.2  PLT 164 148*   Cardiac Enzymes:  Recent Labs  07/18/13 1137  TROPONINI <0.30    Imaging: Dg Chest 2 View  07/18/2013   CLINICAL DATA:  Dysphasia  EXAM: CHEST  2 VIEW  COMPARISON:  07/09/2013  FINDINGS: Mild cardiac enlargement.  Pacemaker leads are unchanged.  Hypoventilation with bibasilar atelectasis, increased from the prior study. Negative for heart failure or effusion.  IMPRESSION:  Hypoventilation with bibasilar atelectasis.   Electronically Signed   By: Marlan Palau M.D.   On: 07/18/2013 12:15    Cardiac Studies:  ECG:  Afib v pacing   Telemetry:  afib  Echo:   Medications:   . amLODipine  5 mg Oral Daily  . furosemide  80 mg Intravenous BID  . heparin  5,000 Units Subcutaneous Q8H  . hydrALAZINE  25 mg Oral TID  . isosorbide mononitrate  60 mg Oral BID  . magnesium oxide  400 mg Oral QHS  . metoprolol tartrate  75 mg Oral TID  . pantoprazole (PROTONIX) IV  40 mg Intravenous Q12H  . simvastatin  10 mg Oral q1800  . sodium chloride  3 mL Intravenous Q12H     . sodium chloride Stopped (07/18/13 2330)  . dextrose 5 % and 0.45% NaCl 35 mL/hr at 07/18/13 1944    Assessment/Plan:  CHF:  Compensated contiue iv lasix for 24 hours and then change to home PO dose HTN:  Well controlled Chol:  Continue statin Dysphagia:  Actually causing some chest pain and may have been etiology all along  EGD today Afib:  On heparin needs coumadin restarted after procedure CRF:  Cr high but stable surprisingly did not bump with  contrast load at cath  Recent cath with no significant CAD  Charlton Haws 07/19/2013, 8:16 AM

## 2013-07-20 ENCOUNTER — Encounter (HOSPITAL_COMMUNITY): Payer: Self-pay

## 2013-07-20 ENCOUNTER — Encounter (HOSPITAL_COMMUNITY)
Admission: EM | Disposition: A | Discharge status: Home / Self Care | Payer: Self-pay | Source: Home / Self Care | Attending: Internal Medicine

## 2013-07-20 DIAGNOSIS — D131 Benign neoplasm of stomach: Secondary | ICD-10-CM | POA: Insufficient documentation

## 2013-07-20 DIAGNOSIS — E876 Hypokalemia: Secondary | ICD-10-CM | POA: Diagnosis not present

## 2013-07-20 HISTORY — PX: ESOPHAGOGASTRODUODENOSCOPY: SHX5428

## 2013-07-20 LAB — BASIC METABOLIC PANEL
BUN: 42 mg/dL — ABNORMAL HIGH (ref 6–23)
CO2: 26 mEq/L (ref 19–32)
Chloride: 101 mEq/L (ref 96–112)
Creatinine, Ser: 2.93 mg/dL — ABNORMAL HIGH (ref 0.50–1.10)
GFR calc Af Amer: 17 mL/min — ABNORMAL LOW (ref >90)

## 2013-07-20 LAB — PROTIME-INR
INR: 1.64 — ABNORMAL HIGH (ref 0.00–1.49)
Prothrombin Time: 19 seconds — ABNORMAL HIGH (ref 11.6–15.2)

## 2013-07-20 SURGERY — EGD (ESOPHAGOGASTRODUODENOSCOPY)
Anesthesia: Moderate Sedation

## 2013-07-20 MED ORDER — MIDAZOLAM HCL 10 MG/2ML IJ SOLN
INTRAMUSCULAR | Status: DC | PRN
  Administered 2013-07-20 (×2): 2 mg via INTRAVENOUS

## 2013-07-20 MED ORDER — SODIUM CHLORIDE 0.9 % IV SOLN
INTRAVENOUS | Status: DC
  Administered 2013-07-20: 08:00:00 via INTRAVENOUS

## 2013-07-20 MED ORDER — BUTAMBEN-TETRACAINE-BENZOCAINE 2-2-14 % EX AERO
INHALATION_SPRAY | CUTANEOUS | Status: DC | PRN
  Administered 2013-07-20: 2 via TOPICAL

## 2013-07-20 MED ORDER — POTASSIUM CHLORIDE CRYS ER 20 MEQ PO TBCR
40.0000 meq | EXTENDED_RELEASE_TABLET | Freq: Once | ORAL | Status: AC
  Administered 2013-07-20: 40 meq via ORAL
  Filled 2013-07-20: qty 2

## 2013-07-20 MED ORDER — FENTANYL CITRATE 0.05 MG/ML IJ SOLN
INTRAMUSCULAR | Status: DC | PRN
  Administered 2013-07-20 (×2): 25 ug via INTRAVENOUS

## 2013-07-20 MED ORDER — MIDAZOLAM HCL 5 MG/ML IJ SOLN
INTRAMUSCULAR | Status: AC
  Filled 2013-07-20: qty 2

## 2013-07-20 MED ORDER — EPINEPHRINE HCL 0.1 MG/ML IJ SOSY
PREFILLED_SYRINGE | INTRAMUSCULAR | Status: AC
  Filled 2013-07-20: qty 10

## 2013-07-20 MED ORDER — SODIUM CHLORIDE 0.9 % IJ SOLN
PREFILLED_SYRINGE | INTRAMUSCULAR | Status: DC | PRN
  Administered 2013-07-20: 14:00:00

## 2013-07-20 MED ORDER — FENTANYL CITRATE 0.05 MG/ML IJ SOLN
INTRAMUSCULAR | Status: AC
  Filled 2013-07-20: qty 4

## 2013-07-20 NOTE — Progress Notes (Signed)
Patient ID: [name], female   DOB: 31-May-1935, 78 y.o.   MRN: [id number]    Subjective:  78 y/o female with h/o CAD, CKD IV, afib (coumadin), sss s/p ppm and AVN RFCA, who was recently discharged following admission for c/p who presents back to the ED with dysphagia and DOE.   she is going for colonoscopy with  Po;ypectomy later this am.  Denies SSCP, palpitations or Dyspnea   Objective:  Filed Vitals:   07/19/13 1335 07/19/13 1616 07/19/13 2049 07/20/13 0424  BP:  124/51 120/53 122/64  Pulse: 59 58 55 64  Temp:  97.7 F (36.5 C) 98.2 F (36.8 C) 97.9 F (36.6 C)  TempSrc:  Oral Oral Oral  Resp: 25 23 20 21   Height:      Weight:   206 lb 12.8 oz (93.804 kg)   SpO2: 98% 97% 98% 97%    Intake/Output from previous day:  Intake/Output Summary (Last 24 hours) at 07/20/13 0900 Last data filed at 07/20/13 0600  Gross per 24 hour  Intake 483.83 ml  Output   1200 ml  Net -716.17 ml    Physical Exam: Affect appropriate Obese white female  HEENT: normal Neck supple with no adenopathy JVP normal no bruits no thyromegaly Lungs clear with no wheezing and good diaphragmatic motion Heart:  RR S1/S2 no murmur, no rub, gallop or click PMI normal Abdomen: benighn, BS positve, no tenderness, no AAA no bruit.  No HSM or HJR Distal pulses intact with no bruits No edema Neuro non-focal Skin warm and dry No muscular weakness   Lab Results: Basic Metabolic Panel:  Recent Labs  06/10/24 0525 07/20/13 0511  NA 138 139  K 3.0* 3.4*  CL 98 101  CO2 27 26  GLUCOSE 107* 96  BUN 37* 42*  CREATININE 2.88* 2.93*  CALCIUM 9.2 8.7   CBC:  Recent Labs  07/18/13 1134 07/19/13 0525  WBC 8.0 5.4  NEUTROABS 6.8  --   HGB 10.5* 10.1*  HCT 31.0* 30.3*  MCV 89.1 90.2  PLT 164 148*   Cardiac Enzymes:  Recent Labs  07/18/13 1137  TROPONINI <0.30    Imaging: Dg Chest 2 View  07/18/2013   CLINICAL DATA:  Dysphasia  EXAM: CHEST  2 VIEW  COMPARISON:  07/09/2013   FINDINGS: Mild cardiac enlargement.  Pacemaker leads are unchanged.  Hypoventilation with bibasilar atelectasis, increased from the prior study. Negative for heart failure or effusion.  IMPRESSION: Hypoventilation with bibasilar atelectasis.   Electronically Signed   By: Marlan Palau M.D.   On: 07/18/2013 12:15    Cardiac Studies:  ECG:  Afib v pacing   Telemetry:  afib  Echo:   Medications:   . amLODipine  5 mg Oral Daily  . furosemide  80 mg Intravenous BID  . hydrALAZINE  25 mg Oral TID  . isosorbide mononitrate  60 mg Oral BID  . magnesium oxide  400 mg Oral QHS  . metoprolol tartrate  75 mg Oral TID  . pantoprazole  40 mg Oral BID  . simvastatin  10 mg Oral q1800  . sodium chloride  3 mL Intravenous Q12H     . sodium chloride 20 mL/hr at 07/20/13 0805    Assessment/Plan:  CHF:  Compensated contiue iv lasix for 24 hours and then change to home PO dose HTN:  Well controlled Chol:  Continue statin Dysphagia:  Actually causing some chest pain and may have been etiology all along  EGD today Afib:  On heparin needs coumadin restarted after procedure   No new recs.  Recent cath with no significant CAD  Elyn Aquas. 07/20/2013, 9:00 AM

## 2013-07-20 NOTE — Progress Notes (Signed)
SLP Cancellation Note  Patient Details Name: [name] MRN: [id number] DOB: October 05, 1934   Cancelled treatment:       Reason Eval/Treat Not Completed: Patient at procedure or test/unavailable   Metro Kung, MA, CCC-SLP 07/20/2013, 4:22 PM

## 2013-07-20 NOTE — Progress Notes (Signed)
[facility] PROGRESS NOTE  AGUSTINA WITZKE HQI:[id number] DOB: 06-30-1935 DOA: 07/18/2013 PCP: [name], MD    Brief narrative  78 y.o. female, with hx of atrial fibrillation on Coumadin at home, dysphagia for the last 1 year, recent admission for chest pain for which she underwent left heart catheterization which was unremarkable and thought to be GI in nature, , chronic diastolic dysfunction, morbid obesity, dyslipidemia, nonocclusive CAD, status post pacemaker placement presented with gradual progression in her chronic dysphagia from solids to liquids over past few months. In the ER she was found to be in volume overload and given IV lasix. Admitted for dysphagia w/up and acute CHF.  Assessment/Plan:  Dysphagia acute on chronic  - now progressive and unable to swallow liquids as well. She has history of bleeding gastric polyp per EGD by Dr. Christella Hartigan in 2011. -she was on aspirin and Coumadin both of which are held .  -appreciate GI consult. EGD done on 12/5 shows Large prepyloric ulcer which was not removed as patient on anticoagulation and plan for repeat EGD with removal today. -pending barium swallow . Likely after EGD later today -currently NPO  -continue PPI   Acute on chronic diastolic CHF.  On IV lasix. Symptoms improved.  Recent echo shows EF of greater than 55%.  Cardiology following . Will continue on IV lasix for today and switch to po in am. Monitor I/O and daily weight.  Atrial fibrillation, bradycardia requiring pacemaker.  Monitor on tele  Continue low-dose beta blocker as tolerated  holding aspirin and Coumadin for now.   Chronic kidney disease stage IV.  Stable at baseline creatinine is around 3   Dyslipidemia  continue home dose statin.   HTN  continue home dose metoprolol and hydralazine   DVT Prophylaxis Heparin - SCDs   Code Status: Full code  Family Communication: none at bedside  Disposition Plan: home likely in am  Consultants:  [facility]  [facility]   Procedures:  EGD on 12/5 and 12/6  Antibiotics:  None   HPI/Subjective:  Feels SOB to be better. Repeat EGD today   Objective: Filed Vitals:   07/20/13 0830  BP: 129/62  Pulse: 62  Temp: 97.6 F (36.4 C)  Resp: 19    Intake/Output Summary (Last 24 hours) at 07/20/13 1111 Last data filed at 07/20/13 0900  Gross per 24 hour  Intake 723.83 ml  Output   1200 ml  Net -476.17 ml   Filed Weights   07/18/13 1715 07/18/13 1951 07/19/13 2049  Weight: 95.1 kg (209 lb 10.5 oz) 94.167 kg (207 lb 9.6 oz) 93.804 kg (206 lb 12.8 oz)    Exam: General: Elderly female in NAD  HEENT: no pallor, moist oral mucosa  chest : clear b/l, no added sounds  CVS: NS1&S2, no murmurs, rubs or gallop  abd: soft, NT, ND, BS+  Ext: warm trace edema  CNS: AAAOX3     Data Reviewed: Basic Metabolic Panel:  Recent Labs Lab 07/15/13 0240 07/16/13 0708 07/18/13 1134 07/19/13 0525 07/20/13 0511  NA 131* 134* 137 138 139  K 3.5 4.9 3.5 3.0* 3.4*  CL 96 97 97 98 101  CO2 22 25 27 27 26   GLUCOSE 99 104* 115* 107* 96  BUN 38* 35* 37* 37* 42*  CREATININE 2.84* 2.66* 3.00* 2.88* 2.93*  CALCIUM 8.8 9.3 9.3 9.2 8.7   Liver Function Tests: No results found for this basename: AST, ALT, ALKPHOS, BILITOT, PROT, ALBUMIN,  in the last 168 hours  No results found for this basename: LIPASE, AMYLASE,  in the last 168 hours No results found for this basename: AMMONIA,  in the last 168 hours CBC:  Recent Labs Lab 07/15/13 0240 07/18/13 1134 07/19/13 0525  WBC 4.5 8.0 5.4  NEUTROABS  --  6.8  --   HGB 10.0* 10.5* 10.1*  HCT 28.8* 31.0* 30.3*  MCV 87.0 89.1 90.2  PLT 158 164 148*   Cardiac Enzymes:  Recent Labs Lab 07/18/13 1137  TROPONINI <0.30   BNP (last 3 results)  Recent Labs  03/27/13 1218 07/09/13 1701 07/18/13 1137  PROBNP 288.0* 412.0* 14148.0*   CBG: No results found for this basename: GLUCAP,  in the last 168 hours  Recent Results (from the  past 240 hour(s))  MRSA PCR SCREENING     Status: None   Collection Time    07/14/13  1:26 PM      Result Value Range Status   MRSA by PCR NEGATIVE  NEGATIVE Final   Comment:            The GeneXpert MRSA Assay (FDA     approved for NASAL specimens     only), is one component of a     comprehensive MRSA colonization     surveillance program. It is not     intended to diagnose MRSA     infection nor to guide or     monitor treatment for     MRSA infections.  URINE CULTURE     Status: None   Collection Time    07/18/13 11:34 AM      Result Value Range Status   Specimen Description URINE, RANDOM   Final   Special Requests NONE   Final   Culture  Setup Time     Final   Value: 07/18/2013 12:40     Performed at [facility] Count     Final   Value: NO GROWTH     Performed at [facility]   Culture     Final   Value: NO GROWTH     Performed at [facility]   Report Status 07/19/2013 FINAL   Final  URINE CULTURE     Status: None   Collection Time    07/18/13  8:01 PM      Result Value Range Status   Specimen Description URINE, RANDOM   Final   Special Requests NONE   Final   Culture  Setup Time     Final   Value: 07/18/2013 20:44     Performed at [facility] Count     Final   Value: NO GROWTH     Performed at [facility]   Culture     Final   Value: NO GROWTH     Performed at [facility]   Report Status 07/19/2013 FINAL   Final     Studies: Dg Chest 2 View  07/18/2013   CLINICAL DATA:  Dysphasia  EXAM: CHEST  2 VIEW  COMPARISON:  07/09/2013  FINDINGS: Mild cardiac enlargement.  Pacemaker leads are unchanged.  Hypoventilation with bibasilar atelectasis, increased from the prior study. Negative for heart failure or effusion.  IMPRESSION: Hypoventilation with bibasilar atelectasis.   Electronically Signed   By: Marlan Palau M.D.   On: 07/18/2013 12:15    Scheduled Meds: . amLODipine  5 mg Oral Daily   . furosemide  80 mg Intravenous BID  .  hydrALAZINE  25 mg Oral TID  . isosorbide mononitrate  60 mg Oral BID  . magnesium oxide  400 mg Oral QHS  . metoprolol tartrate  75 mg Oral TID  . pantoprazole  40 mg Oral BID  . simvastatin  10 mg Oral q1800  . sodium chloride  3 mL Intravenous Q12H   Continuous Infusions: . sodium chloride 20 mL/hr at 07/20/13 0805      Time spent: 25 minutes    Holden Draughon  [facility] Pager [phone]. If 7PM-7AM, please contact night-coverage at [email or web address], password [facility] 07/20/2013, 11:11 AM  LOS: 2 days

## 2013-07-20 NOTE — Progress Notes (Signed)
Procedure note/ Upper endoscopy and polypectomy:   Results:  Presbyesophagus  Large prepyloric polyp- removed piecemeal with hot snare  Plan:   Await pathology results Resume diet OK to restart Coumadin OK to discharge from GI standpoint- follow up as an outpatient  With GI

## 2013-07-21 DIAGNOSIS — Z950 Presence of cardiac pacemaker: Secondary | ICD-10-CM

## 2013-07-21 DIAGNOSIS — K317 Polyp of stomach and duodenum: Secondary | ICD-10-CM | POA: Diagnosis present

## 2013-07-21 LAB — BASIC METABOLIC PANEL
CO2: 27 mEq/L (ref 19–32)
Calcium: 8.7 mg/dL (ref 8.4–10.5)
Creatinine, Ser: 3.1 mg/dL — ABNORMAL HIGH (ref 0.50–1.10)
GFR calc non Af Amer: 14 mL/min — ABNORMAL LOW (ref >90)
Glucose, Bld: 96 mg/dL (ref 70–99)
Potassium: 3.4 mEq/L — ABNORMAL LOW (ref 3.5–5.1)

## 2013-07-21 LAB — PROTIME-INR
INR: 1.44 (ref 0.00–1.49)
Prothrombin Time: 17.2 seconds — ABNORMAL HIGH (ref 11.6–15.2)

## 2013-07-21 MED ORDER — POTASSIUM CHLORIDE CRYS ER 20 MEQ PO TBCR
40.0000 meq | EXTENDED_RELEASE_TABLET | Freq: Once | ORAL | Status: AC
  Administered 2013-07-21: 40 meq via ORAL
  Filled 2013-07-21: qty 2

## 2013-07-21 NOTE — Discharge Instructions (Signed)
Heart Failure Heart failure means your heart has trouble pumping blood. This makes it hard for your body to work well. Heart failure is usually a long-term (chronic) condition. You must take good care of yourself and follow your doctor's treatment plan. HOME CARE  Take your heart medicine as told by your doctor.  Do not stop taking medicine unless your doctor tells you to.  Do not skip any dose of medicine.  Refill your medicines before they run out.  Take other medicines only as told by your doctor or pharmacist.  Stay active if told by your doctor. The elderly and people with severe heart failure should talk with a doctor about physical activity.  Eat heart healthy foods. Choose foods that are without trans fat and are low in saturated fat, cholesterol, and salt (sodium). This includes fresh or frozen fruits and vegetables, fish, lean meats, fat-free or low-fat dairy foods, whole grains, and high-fiber foods. Lentils and dried peas and beans (legumes) are also good choices.  Limit salt if told by your doctor.  Cook in a healthy way. Roast, grill, broil, bake, poach, steam, or stir-fry foods.  Limit fluids as told by your doctor.  Weigh yourself every morning. Do this after you pee (urinate) and before you eat breakfast. Write down your weight to give to your doctor.  Take your blood pressure and write it down if your doctor tell you to.  Ask your doctor how to check your pulse. Check your pulse as told.  Lose weight if told by your doctor.  Stop smoking or chewing tobacco. Do not use gum or patches that help you quit without your doctor's approval.  Schedule and go to doctor visits as told.  Nonpregnant women should have no more than 1 drink a day. Men should have no more than 2 drinks a day. Talk to your doctor about drinking alcohol.  Stop illegal drug use.  Stay current with shots (immunizations).  Manage your health conditions as told by your doctor.  Learn to manage  your stress.  Rest when you are tired.  If it is really hot outside:  Avoid intense activities.  Use air conditioning or fans, or get in a cooler place.  Avoid caffeine and alcohol.  Wear loose-fitting, lightweight, and light-colored clothing.  If it is really cold outside:  Avoid intense activities.  Layer your clothing.  Wear mittens or gloves, a hat, and a scarf when going outside.  Avoid alcohol.  Learn about heart failure and get support as needed.  Get help to maintain or improve your quality of life and your ability to care for yourself as needed. GET HELP IF:   You gain 03 lb/1.4 kg or more in 1 day or 05 lb/2.3 kg in a week.  You are more short of breath than usual.  You cannot do your normal activities.  You tire easily.  You cough more than normal, especially with activity.  You have any or more puffiness (swelling) in areas such as your hands, feet, ankles, or belly (abdomen).  You cannot sleep because it is hard to breathe.  You feel like your heart is beating fast (palpitations).  You get dizzy or lightheaded when you stand up. GET HELP RIGHT AWAY IF:   You have trouble breathing.  There is a change in mental status, such as becoming less alert or not being able to focus.  You have chest pain or discomfort.  You faint. MAKE SURE YOU:   Understand these   instructions.  Will watch your condition.  Will get help right away if you are not doing well or get worse. Document Released: 05/10/2008 Document Revised: 11/26/2012 Document Reviewed: 03/01/2012 [employer] Patient Information 2014 [employer].  

## 2013-07-21 NOTE — Progress Notes (Signed)
Patient ID: [name], female   DOB: [birth year], 78 y.o.   MRN: [id number]    Subjective:  78 y/o female with h/o CAD, CKD IV, afib (coumadin), sss s/p ppm and AVN RFCA, who was recently discharged following admission for c/p who presents back to the ED with dysphagia and DOE.  She had EGD  with  polypectomy  Yesterday. Denies SSCP, palpitations or Dyspnea   Objective:  Filed Vitals:   07/20/13 1500 07/20/13 1641 07/20/13 2107 07/21/13 0425  BP: 125/60 128/57 156/66 119/55  Pulse: 116 114 80 83  Temp: 97.8 F (36.6 C) 98.3 F (36.8 C) 97.3 F (36.3 C) 98 F (36.7 C)  TempSrc: Oral Oral Oral Oral  Resp: 18 20 20 20   Height:      Weight:   206 lb 12.8 oz (93.804 kg)   SpO2: 96% 99% 95% 98%    Intake/Output from previous day:  Intake/Output Summary (Last 24 hours) at 07/21/13 0746 Last data filed at 07/21/13 0439  Gross per 24 hour  Intake    840 ml  Output   2000 ml  Net  -1160 ml    Physical Exam: Affect appropriate Obese white female  HEENT: normal Neck supple with no adenopathy JVP normal no bruits no thyromegaly Lungs clear with no wheezing and good diaphragmatic motion Heart:  RR S1/S2 no murmur, no rub, gallop or click PMI normal Abdomen: benighn, BS positve, no tenderness, no AAA no bruit.  No HSM or HJR Distal pulses intact with no bruits No edema Neuro non-focal Skin warm and dry No muscular weakness   Lab Results: Basic Metabolic Panel:  Recent Labs  56/21/30 0511 07/21/13 0538  NA 139 141  K 3.4* 3.4*  CL 101 101  CO2 26 27  GLUCOSE 96 96  BUN 42* 43*  CREATININE 2.93* 3.10*  CALCIUM 8.7 8.7   CBC:  Recent Labs  07/18/13 1134 07/19/13 0525  WBC 8.0 5.4  NEUTROABS 6.8  --   HGB 10.5* 10.1*  HCT 31.0* 30.3*  MCV 89.1 90.2  PLT 164 148*   Cardiac Enzymes:  Recent Labs  07/18/13 1137  TROPONINI <0.30    Imaging: No results found.  Cardiac Studies:  ECG:  Afib v pacing   Telemetry:  V-paced   Echo:    Medications:   . amLODipine  5 mg Oral Daily  . furosemide  80 mg Intravenous BID  . hydrALAZINE  25 mg Oral TID  . isosorbide mononitrate  60 mg Oral BID  . magnesium oxide  400 mg Oral QHS  . metoprolol tartrate  75 mg Oral TID  . pantoprazole  40 mg Oral BID  . simvastatin  10 mg Oral q1800  . sodium chloride  3 mL Intravenous Q12H        Assessment/Plan:  1. Chronic diastolic CHF:  Seems to be stable.  Restart home lasix 40 mg per medicine team. 2.  HTN:  Well controlled 3, Hyperlipidemia: Continue statin 4.  Dysphagia:  Actually causing some chest pain and may have been etiology of her CP 5.  Afib:   - she should restart her coumadin soon - ? Tonight ( assuming its ok with GI).     No new recs.      Elyn Aquas. 07/21/2013, 7:46 AM

## 2013-07-21 NOTE — Progress Notes (Signed)
Patient discharge Home per Md order.  Discharge instructions reviewed with patient and family.  Copies of all forms given and explained. Patient/family voiced understanding of all instructions.  Discharge in no acute distress. Prosper Paff B. Laken Lobato, RN BC, BSN, MSN 

## 2013-07-21 NOTE — Discharge Summary (Addendum)
Physician Discharge Summary  LORRIANN HANSMANN ZOX:[id number] DOB: Mar 19, 1935 DOA: 07/18/2013  PCP: [name], MD  Admit date: 07/18/2013 Discharge date: 07/21/2013  Time spent: 40 minutes  Recommendations for Outpatient Follow-up:  1. Home with outpt PCP and cardiology follow up. Needs renal function and INR checked upon follow up. 2. Follow up with Dr Juanda Chance in 4 weeks  Discharge Diagnoses:  Principal Problem:   Dysphagia  Active Problems:   Acute on chronic diastolic heart failure   Gastric polyp   Hyperthyroidism   HYPERTENSION   PACEMAKER-Medtronic   A-fib   CKD (chronic kidney disease) stage 4, GFR 15-29 ml/min   HLD (hyperlipidemia)   Hypokalemia   Discharge Condition: fair  Diet recommendation: cardiac  Filed Weights   07/18/13 1951 07/19/13 2049 07/20/13 2107  Weight: 94.167 kg (207 lb 9.6 oz) 93.804 kg (206 lb 12.8 oz) 93.804 kg (206 lb 12.8 oz)    History of present illness:  Please refer to admission H&P for details, but in brief, 78 y.o. female, with hx of atrial fibrillation on Coumadin at home, dysphagia for the last 1 year, recent admission for chest pain for which she underwent left heart catheterization which was unremarkable and thought to be GI in nature, , chronic diastolic dysfunction, morbid obesity, dyslipidemia, nonocclusive CAD, status post pacemaker placement presented with gradual progression in her chronic dysphagia from solids to liquids over past few months. In the ER she was found to be in volume overload and given IV lasix. Admitted for dysphagia w/up and acute CHF.    Hospital Course:  Dysphagia acute on chronic  - patient presented with  Progressive dysphagia with  inability to swallow liquids as well. She has history of bleeding gastric polyp per EGD by Dr. Christella Hartigan in 2011.  -she was on aspirin and Coumadin both of which were held .  -appreciate GI consult. EGD done on 12/5 showed Large prepyloric ulcer which was not removed as patient  on anticoagulation and so a  repeat EGD  Done on 12/6 with removal of the polyp and dilatation of esophagus given suspicion for presbyesophagus. Polyp sent for biopsy. -symptoms resolved after dilatation and now tolerating regular diet. Does not need barium swallow at this time and can be done as outpt if needed. -continue PPI   Acute on chronic diastolic CHF.  Patient clinically noted to be in CHF with elevated proBNP -placed On IV lasix. Symptoms improved.  Recent echo shows EF of greater than 55%. Recently had cardiac cath with nonocclusive CAD. -Appreciate cardiology recommendations. symptoms much improved with IV lasix. Will switch to home dose po lasix. Follow up with cardiology as outpt  Atrial fibrillation, bradycardia requiring pacemaker.  Monitor on tele  Continue low-dose beta blocker as tolerated  Resume ASA and coumadin upon discharge. follow INR as outpt  Chronic kidney disease stage IV.   baseline creatinine is around 3 . Mild worsening due to IV lasix. Follow up as outpt  Dyslipidemia  continue home dose statin.   HTN  continue home dose metoprolol and hydralazine   Code Status: Full code  Family Communication: none at bedside  Disposition Plan: home with outpt follow up  Consultants:  [facility]  [facility]  Procedures:  EGD on 12/5 and 12/6    Antibiotics:  None     Discharge Exam: Filed Vitals:   07/21/13 0425  BP: 119/55  Pulse: 83  Temp: 98 F (36.7 C)  Resp: 20    General: Elderly female in NAD  HEENT: no pallor, moist oral mucosa  chest : clear b/l, no added sounds  CVS: NS1&S2, no murmurs, rubs or gallop  abd: soft, NT, ND, BS+  Ext: warm trace edema  CNS: AAAOX3   Discharge Instructions   Future Appointments Provider Department Dept Phone   07/22/2013 1:45 PM [facility] [place] Office [phone]   07/24/2013 3:30 PM Wendall Stade, MD [facility] [place] Office [phone]   07/24/2013 4:00  PM [facility] [place] Office [phone]   08/12/2013 10:30 AM [facility] [address] [phone]   08/12/2013 11:30 AM Nada Libman, MD [facility] [phone]       Medication List    TAKE these medications       ALPRAZolam 0.5 MG tablet  Commonly known as:  XANAX  Take 0.5 mg by mouth 3 (three) times daily as needed.     amLODipine 5 MG tablet  Commonly known as:  NORVASC  Take 1 tablet (5 mg total) by mouth daily.     aspirin EC 81 MG tablet  Take 81 mg by mouth daily.     ferrous sulfate 325 (65 FE) MG tablet  Take 325 mg by mouth daily with breakfast.     furosemide 40 MG tablet  Commonly known as:  LASIX  Take 40 mg by mouth daily.     hydrALAZINE 25 MG tablet  Commonly known as:  APRESOLINE  Take 1 tablet (25 mg total) by mouth 3 (three) times daily.     isosorbide mononitrate 60 MG 24 hr tablet  Commonly known as:  IMDUR  Take 60 mg by mouth 2 (two) times daily.     lovastatin 10 MG tablet  Commonly known as:  MEVACOR  Take 10 mg by mouth at bedtime.     magnesium oxide 400 MG tablet  Commonly known as:  MAG-OX  Take 400 mg by mouth at bedtime.     metoprolol tartrate 25 MG tablet  Commonly known as:  LOPRESSOR  Take 3 tablets (75 mg total) by mouth 3 (three) times daily.     nitroGLYCERIN 0.4 MG SL tablet  Commonly known as:  NITROSTAT  Place 0.4 mg under the tongue every 5 (five) minutes as needed for chest pain.     oxyCODONE-acetaminophen 5-325 MG per tablet  Commonly known as:  PERCOCET/ROXICET  Take 1 tablet by mouth 3 (three) times daily as needed for moderate pain. For pain     paricalcitol 1 MCG capsule  Commonly known as:  ZEMPLAR  Take 1 mcg by mouth at bedtime.     PROTONIX 40 MG tablet  Generic drug:  pantoprazole  Take 40 mg by mouth 2 (two) times daily.     warfarin 2 MG tablet  Commonly known as:  COUMADIN  Pt takes  1 tablet (2mg ) on  Tuesday, Wednesday, Thursday, Saturday, Sunday, and 1.5 tablets (3mg ) on Monday and Friday.      ASK your doctor about these medications       tetrahydrozoline 0.05 % ophthalmic solution  Place 1 drop into both eyes 2 (two) times daily as needed (reddness).       Allergies  Allergen Reactions  . Amiodarone Hives  . Propylthiouracil Rash       Follow-up Information   Follow up with LITTLE,JAMES, MD In 1 week.   Specialty:  Family Medicine   Contact information:   [address] [place] [address] [phone]       Follow up with Eddie North, MD On 07/24/2013.   Specialty:  Internal Medicine   Contact information:   [address] [facility] [place] [address] [phone]        The results of significant diagnostics from this hospitalization (including imaging, microbiology, ancillary and laboratory) are listed below for reference.    Significant Diagnostic Studies: Dg Chest 2 View  07/18/2013   CLINICAL DATA:  Dysphasia  EXAM: CHEST  2 VIEW  COMPARISON:  07/09/2013  FINDINGS: Mild cardiac enlargement.  Pacemaker leads are unchanged.  Hypoventilation with bibasilar atelectasis, increased from the prior study. Negative for heart failure or effusion.  IMPRESSION: Hypoventilation with bibasilar atelectasis.   Electronically Signed   By: Marlan Palau M.D.   On: 07/18/2013 12:15   Dg Chest 2 View  07/09/2013   CLINICAL DATA:  Chronic diastolic heart failure, coronary artery disease, pre catheterization, history hypertension, atrial fibrillation, MI, chronic renal insufficiency  EXAM: CHEST  2 VIEW  COMPARISON:  06/18/2013  FINDINGS: Left subclavian transvenous pacemaker leads project over right atrium, right ventricle and coronary sinus.  Enlargement of cardiac silhouette.  Tortuous aorta.  Pulmonary vascularity normal.  Mild chronic elevation of right diaphragm.  Lungs clear.  No pleural effusion or pneumothorax.  IMPRESSION: Minimal enlargement  of cardiac silhouette post pacemaker.  No acute abnormalities.   Electronically Signed   By: Ulyses Southward M.D.   On: 07/09/2013 16:57    Microbiology: Recent Results (from the past 240 hour(s))  MRSA PCR SCREENING     Status: None   Collection Time    07/14/13  1:26 PM      Result Value Range Status   MRSA by PCR NEGATIVE  NEGATIVE Final   Comment:            The GeneXpert MRSA Assay (FDA     approved for NASAL specimens     only), is one component of a     comprehensive MRSA colonization     surveillance program. It is not     intended to diagnose MRSA     infection nor to guide or     monitor treatment for     MRSA infections.  URINE CULTURE     Status: None   Collection Time    07/18/13 11:34 AM      Result Value Range Status   Specimen Description URINE, RANDOM   Final   Special Requests NONE   Final   Culture  Setup Time     Final   Value: 07/18/2013 12:40     Performed at [facility] Count     Final   Value: NO GROWTH     Performed at [facility]   Culture     Final   Value: NO GROWTH     Performed at [facility]   Report Status 07/19/2013 FINAL   Final  URINE CULTURE     Status: None   Collection Time    07/18/13  8:01 PM      Result Value Range Status   Specimen Description URINE, RANDOM   Final   Special Requests NONE   Final   Culture  Setup Time     Final   Value: 07/18/2013 20:44     Performed at [facility] Count     Final  Value: NO GROWTH     Performed at [facility]   Culture     Final   Value: NO GROWTH     Performed at [facility]   Report Status 07/19/2013 FINAL   Final     Labs: Basic Metabolic Panel:  Recent Labs Lab 07/16/13 0708 07/18/13 1134 07/19/13 0525 07/20/13 0511 07/21/13 0538  NA 134* 137 138 139 141  K 4.9 3.5 3.0* 3.4* 3.4*  CL 97 97 98 101 101  CO2 25 27 27 26 27   GLUCOSE 104* 115* 107* 96 96  BUN 35* 37* 37* 42* 43*  CREATININE 2.66* 3.00*  2.88* 2.93* 3.10*  CALCIUM 9.3 9.3 9.2 8.7 8.7   Liver Function Tests: No results found for this basename: AST, ALT, ALKPHOS, BILITOT, PROT, ALBUMIN,  in the last 168 hours No results found for this basename: LIPASE, AMYLASE,  in the last 168 hours No results found for this basename: AMMONIA,  in the last 168 hours CBC:  Recent Labs Lab 07/15/13 0240 07/18/13 1134 07/19/13 0525  WBC 4.5 8.0 5.4  NEUTROABS  --  6.8  --   HGB 10.0* 10.5* 10.1*  HCT 28.8* 31.0* 30.3*  MCV 87.0 89.1 90.2  PLT 158 164 148*   Cardiac Enzymes:  Recent Labs Lab 07/18/13 1137  TROPONINI <0.30   BNP: BNP (last 3 results)  Recent Labs  03/27/13 1218 07/09/13 1701 07/18/13 1137  PROBNP 288.0* 412.0* 14148.0*   CBG: No results found for this basename: GLUCAP,  in the last 168 hours     Signed:  Ilyssa Grennan  [facility] 07/21/2013, 9:27 AM

## 2013-07-21 NOTE — Progress Notes (Signed)
   Subjective  No trouble swallowing this morning   Objective   Vital signs in last 24 hours: Temp:  [97.3 F (36.3 C)-98.3 F (36.8 C)] 98.2 F (36.8 C) (12/07 1000) Pulse Rate:  [53-116] 61 (12/07 1000) Resp:  [14-21] 20 (12/07 1000) BP: (119-188)/(55-82) 124/57 mmHg (12/07 1000) SpO2:  [92 %-99 %] 98 % (12/07 1000) Weight:  [206 lb 12.8 oz (93.804 kg)] 206 lb 12.8 oz (93.804 kg) (12/06 2107) Last BM Date: 07/19/13 General:    white female in NAD Heart:  Regular rate and rhythm; no murmurs Lungs: Respirations even and unlabored, lungs CTA bilaterally Abdomen:  Soft, nontender and nondistended. Normal bowel sounds. Extremities:  Without edema. Neurologic:  Alert and oriented,  grossly normal neurologically. Psych:  Cooperative. Normal mood and affect.  Intake/Output from previous day: 12/06 0701 - 12/07 0700 In: 840 [P.O.:840] Out: 2000 [Urine:2000] Intake/Output this shift: Total I/O In: 240 [P.O.:240] Out: 150 [Urine:150]  Lab Results:  Recent Labs  07/18/13 1134 07/19/13 0525  WBC 8.0 5.4  HGB 10.5* 10.1*  HCT 31.0* 30.3*  PLT 164 148*   BMET  Recent Labs  07/19/13 0525 07/20/13 0511 07/21/13 0538  NA 138 139 141  K 3.0* 3.4* 3.4*  CL 98 101 101  CO2 27 26 27   GLUCOSE 107* 96 96  BUN 37* 42* 43*  CREATININE 2.88* 2.93* 3.10*  CALCIUM 9.2 8.7 8.7   LFT No results found for this basename: PROT, ALBUMIN, AST, ALT, ALKPHOS, BILITOT, BILIDIR, IBILI,  in the last 72 hours PT/INR  Recent Labs  07/20/13 0511 07/21/13 0940  LABPROT 19.0* 17.2*  INR 1.64* 1.44    Studies/Results: No results found.     Assessment / Plan:   Dysphagia- s/p dilation, suspect presbyesophagus contributing, dysphagia improved since dilation  Large gastric polyp- removed- awaiting path report,  OK to discharge today from GI standpoint , OK to restart Coumadin  Principal Problem:   Dysphagia Active Problems:   HYPERTHYROIDISM   HYPERTENSION  PACEMAKER-Medtronic   Acute on chronic diastolic heart failure   A-fib   CKD (chronic kidney disease) stage 4, GFR 15-29 ml/min   HLD (hyperlipidemia)   Hypokalemia   Gastric polyp     LOS: 3 days   Lina Sar  07/21/2013, 10:49 AM

## 2013-07-22 ENCOUNTER — Encounter (HOSPITAL_COMMUNITY): Payer: Self-pay | Admitting: Internal Medicine

## 2013-07-22 ENCOUNTER — Other Ambulatory Visit (INDEPENDENT_AMBULATORY_CARE_PROVIDER_SITE_OTHER): Payer: Medicare Other

## 2013-07-22 DIAGNOSIS — E876 Hypokalemia: Secondary | ICD-10-CM

## 2013-07-22 LAB — BASIC METABOLIC PANEL
BUN: 44 mg/dL — ABNORMAL HIGH (ref 6–23)
CO2: 23 mEq/L (ref 19–32)
Chloride: 101 mEq/L (ref 96–112)
Creatinine, Ser: 3.2 mg/dL — ABNORMAL HIGH (ref 0.4–1.2)
Glucose, Bld: 89 mg/dL (ref 70–99)

## 2013-07-22 NOTE — Op Note (Signed)
[facility] [address] [place], [address]   ENDOSCOPY PROCEDURE REPORT  PATIENT: [name], [name]  MR#: [id number] BIRTHDATE: Jun 11, 1935 , 78  yrs. old GENDER: Female ENDOSCOPIST: Hart Carwin, MD REFERRED BY: Dr Gonzella Lex PROCEDURE DATE:  07/20/2013 PROCEDURE:  EGD w/ snare technique ASA CLASS:     Class III INDICATIONS:  followup large prepyloric polyp. MEDICATIONS: These medications were titrated to patient response per physician's verbal order, Fentanyl-Detailed 50 mg IV, and Versed 4 mg IV TOPICAL ANESTHETIC: Cetacaine Spray  DESCRIPTION OF PROCEDURE: After the risks benefits and alternatives of the procedure were thoroughly explained, informed consent was obtained.  The PENTAX GASTOROSCOPE [id number] endoscope was introduced through the mouth and advanced to the second portion of the duodenum. Without limitations.  The instrument was slowly withdrawn as the mucosa was fully examined.      [Esophagus,:the presbyesophagus was noted No esophageal stricture stomach  : Stomach mucosa appeared normal. There was a 2 cm pedunculated polyp in the prepyloric antrum which was prolapsing through the pylorus into the duodenum. It was injected with epinephrine total of 2 cc. Endo Clip was applied to the stalk of the polyp and piecemeal polypectomy with was perform with a hot snare. 3 pieces were obtained. There was no bleeding from polypectomy site Duodenum duodenal bulb and descending duodenum was normal The scope was then withdrawn from the patient and the procedure completed.  COMPLICATIONS: There were no complications. ENDOSCOPIC IMPRESSION: large prepyloric polyp removed piecemeal lysed. Epinephrine and Endo Clip at the light lip removed piecemeal RECOMMENDATIONS: await pathology results May resume CoumadinAdvance diet Okay to discharge from GI standpoint REPEAT EXAM: for EGD pending pathology.  eSigned:  Hart Carwin, MD 07/20/2013 1:44  PM   CC:  PATIENT NAME:  [name], [name] MR#: [id number]

## 2013-07-23 ENCOUNTER — Telehealth: Payer: Self-pay | Admitting: Cardiovascular Disease

## 2013-07-23 ENCOUNTER — Encounter: Payer: Self-pay | Admitting: Internal Medicine

## 2013-07-23 NOTE — Telephone Encounter (Signed)
Follow up    Ashok Norris RN w/[employer] needs lab work from 12/8 read or faxed to her fax # [phone].   Pt is in her CHF program.

## 2013-07-23 NOTE — Telephone Encounter (Signed)
Ashok [place], RN - CHF [facility] called requesting lab work results. [id number] /PE

## 2013-07-24 ENCOUNTER — Encounter: Payer: Self-pay | Admitting: Cardiovascular Disease

## 2013-07-24 ENCOUNTER — Ambulatory Visit (INDEPENDENT_AMBULATORY_CARE_PROVIDER_SITE_OTHER): Payer: Medicare Other | Admitting: Cardiovascular Disease

## 2013-07-24 ENCOUNTER — Ambulatory Visit (INDEPENDENT_AMBULATORY_CARE_PROVIDER_SITE_OTHER): Payer: Medicare Other | Admitting: *Deleted

## 2013-07-24 VITALS — BP 140/70 | HR 68 | Ht 67.0 in | Wt 208.0 lb

## 2013-07-24 DIAGNOSIS — I498 Other specified cardiac arrhythmias: Secondary | ICD-10-CM

## 2013-07-24 DIAGNOSIS — I4891 Unspecified atrial fibrillation: Secondary | ICD-10-CM

## 2013-07-24 DIAGNOSIS — I5033 Acute on chronic diastolic (congestive) heart failure: Secondary | ICD-10-CM

## 2013-07-24 DIAGNOSIS — I10 Essential (primary) hypertension: Secondary | ICD-10-CM

## 2013-07-24 DIAGNOSIS — N259 Disorder resulting from impaired renal tubular function, unspecified: Secondary | ICD-10-CM

## 2013-07-24 DIAGNOSIS — I509 Heart failure, unspecified: Secondary | ICD-10-CM

## 2013-07-24 DIAGNOSIS — Z950 Presence of cardiac pacemaker: Secondary | ICD-10-CM

## 2013-07-24 DIAGNOSIS — I2510 Atherosclerotic heart disease of native coronary artery without angina pectoris: Secondary | ICD-10-CM

## 2013-07-24 DIAGNOSIS — Z8719 Personal history of other diseases of the digestive system: Secondary | ICD-10-CM

## 2013-07-24 LAB — MDC_IDC_ENUM_SESS_TYPE_INCLINIC
Battery Voltage: 2.958
Lead Channel Impedance Value: 477 Ohm
Lead Channel Impedance Value: 632 Ohm
Lead Channel Setting Pacing Amplitude: 2 V
Lead Channel Setting Pacing Amplitude: 2.5 V
Lead Channel Setting Pacing Pulse Width: 0.4 ms
Lead Channel Setting Pacing Pulse Width: 0.4 ms
Lead Channel Setting Sensing Sensitivity: 2.8 mV

## 2013-07-24 MED ORDER — METOLAZONE 2.5 MG PO TABS: ORAL_TABLET | ORAL | Status: DC

## 2013-07-24 MED ORDER — FUROSEMIDE 40 MG PO TABS: 80.0000 mg | ORAL_TABLET | Freq: Every day | ORAL | Status: DC

## 2013-07-24 NOTE — Assessment & Plan Note (Signed)
Stable with no angina and good activity level.  Continue medical Rx Recent cath with no critical stenosis  

## 2013-07-24 NOTE — Telephone Encounter (Signed)
Follow Up  [name] returned call for results// please assist

## 2013-07-24 NOTE — Assessment & Plan Note (Signed)
Hct stable recent EGD with Dr Juanda Chance with dilatation and polyp removal Dysphagia much improved Continue PPI

## 2013-07-24 NOTE — Assessment & Plan Note (Signed)
Normal function f.u GT in 3 months

## 2013-07-24 NOTE — Assessment & Plan Note (Signed)
Well controlled.  Continue current medications and low sodium Dash type diet.    

## 2013-07-24 NOTE — Assessment & Plan Note (Signed)
Good rate control with pacer back up Back on anticoagulation

## 2013-07-24 NOTE — Assessment & Plan Note (Signed)
Cr stabel 3.2  She is retaining fluid  Increase lasix 80 bid Zaroxyln 2.5 for weight gain over 3 lbs/day.  Dr Rich Reining to see tomorrow and he can adjust further

## 2013-07-24 NOTE — Patient Instructions (Addendum)
Your physician recommends that you schedule a follow-up appointment in:   6 WEEKS WITH   LORI  OR SCOTT AND  3 MONTHS WITH DR [facility]  Your physician has recommended you make the following change in your medication: STOP  AMLODIPINE INCREASE  FUROSEMIDE  TO  80 MG     EVERY DAY    AND   MAY  TAKE ZAROXOLYN  2.5 MG  AS NEEDED  WITH   3 OR POUND  WEIGHT  GAIN  IN  A  24 HOUR   PERIOD

## 2013-07-24 NOTE — Progress Notes (Signed)
Patient ID: MAZIE FENCL, female   DOB: [birth year], 78 y.o.   MRN: [id number] [name] is a 78 y.o. female with a hx of CAD, s/p DES to the OM in 6/06, rotational atherectomy + DES to pLAD in 7/09, symptomatic bradycardia status post pacemaker with upgrade to CRT-P 11/08, atrial fibrillation with prior AV node ablation (prior amiodarone thyroiditis), chronic diastolic CHF, mitral regurgitation, chronic kidney disease stage IV, HTN, HL, GERD, prior upper GI bleed in 2008 related to NSAID use, PVD.  Myoview (01/2009): EF 79%, no scar or ischemia. LHC (12/2010): Proximal LAD stent patent with 30% distal to stent, mid LAD 30%, ostial first septal branch 80%, OM1 stent patent (10-20% ISR), mid circumflex 20%, proximal RCA 30-40%, mid RCA 50-60%-medical therapy continued. Echo (03/2013): Mild focal basal septal hypertrophy, EF 55-60%, mild MR, moderate to severe LAE, mild to moderate RVE, reduced RVSF, moderate RAE.   Recent crescendo chest pain but cath with Dr Lonzo Candy showed no critical disease and Cr remained stable.    Just d/c 12/4 with dysphagia.   - patient presented with Progressive dysphagia with inability to swallow liquids as well. She has history of bleeding gastric polyp per EGD by Dr. Christella Hartigan in 2011.  -she was on aspirin and Coumadin both of which were held .  -appreciate GI consult. EGD done on 12/5 showed Large prepyloric ulcer which was not removed as patient on anticoagulation and so a repeat EGD Done on 12/6 with removal of the polyp and dilatation of esophagus given suspicion for presbyesophagus. Polyp sent for biopsy.  -symptoms resolved after dilatation and now tolerating regular diet. Does not need barium swallow at this time and can be done as outpt if needed.  -continue PPI    Cr on 07/22/13 stable at 3.2 with normal K   Left hospital on lower dose lasix  Having more swelling  Sees Dr Rich Reining tomorrow  Reviewed pacer interogation Normal function 3 years battery life   Programed VVIR due to afib    ROS: Denies fever, malais, weight loss, blurry vision, decreased visual acuity, cough, sputum, SOB, hemoptysis, pleuritic pain, palpitaitons, heartburn, abdominal pain, melena, lower extremity edema, claudication, or rash.  All other systems reviewed and negative  General: Affect appropriate Elderly female  HEENT: normal Neck supple with no adenopathy JVP normal no bruits no thyromegaly Lungs clear with no wheezing and good diaphragmatic motion Heart:  S1/S2 SEM  murmur, no rub, gallop or click PMI normal Abdomen: benighn, BS positve, no tenderness, no AAA no bruit.  No HSM or HJR Distal pulses intact with no bruits Plus one  Edema with varicosities  Neuro non-focal Skin warm and dry No muscular weakness   Current Outpatient Prescriptions  Medication Sig Dispense Refill  . ALPRAZolam (XANAX) 0.5 MG tablet Take 0.5 mg by mouth 3 (three) times daily as needed.       Marland Kitchen amLODipine (NORVASC) 5 MG tablet Take 1 tablet (5 mg total) by mouth daily.  30 tablet  11  . aspirin EC 81 MG tablet Take 81 mg by mouth daily.      . ferrous sulfate 325 (65 FE) MG tablet Take 325 mg by mouth daily with breakfast.       . furosemide (LASIX) 40 MG tablet Take 40 mg by mouth daily.       . hydrALAZINE (APRESOLINE) 25 MG tablet Take 1 tablet (25 mg total) by mouth 3 (three) times daily.  90 tablet  3  . isosorbide mononitrate (  IMDUR) 60 MG 24 hr tablet Take 60 mg by mouth 2 (two) times daily.      Marland Kitchen lovastatin (MEVACOR) 10 MG tablet Take 10 mg by mouth at bedtime.       . magnesium oxide (MAG-OX) 400 MG tablet Take 400 mg by mouth at bedtime.       . metoprolol tartrate (LOPRESSOR) 25 MG tablet Take 3 tablets (75 mg total) by mouth 3 (three) times daily.  256 tablet  3  . nitroGLYCERIN (NITROSTAT) 0.4 MG SL tablet Place 0.4 mg under the tongue every 5 (five) minutes as needed for chest pain.      Marland Kitchen oxyCODONE-acetaminophen (PERCOCET/ROXICET) 5-325 MG per tablet Take 1 tablet  by mouth 3 (three) times daily as needed for moderate pain. For pain  30 tablet  0  . pantoprazole (PROTONIX) 40 MG tablet Take 40 mg by mouth 2 (two) times daily.       . paricalcitol (ZEMPLAR) 1 MCG capsule Take 1 mcg by mouth at bedtime.      Marland Kitchen tetrahydrozoline 0.05 % ophthalmic solution Place 1 drop into both eyes 2 (two) times daily as needed (reddness).      . warfarin (COUMADIN) 2 MG tablet Pt takes  1 tablet (2mg ) on Tuesday, Wednesday, Thursday, Saturday, Sunday, and 1.5 tablets (3mg ) on Monday and Friday.      . [DISCONTINUED] FLUoxetine (PROZAC) 20 MG capsule Take 20 mg by mouth daily.         No current facility-administered medications for this visit.    Allergies  Amiodarone and Propylthiouracil  Electrocardiogram:   Assessment and Plan

## 2013-07-24 NOTE — Telephone Encounter (Signed)
LMTCB ./CY 

## 2013-07-24 NOTE — Telephone Encounter (Signed)
Patient is returning your call.  

## 2013-07-26 NOTE — Telephone Encounter (Signed)
LMTCB ./CY 

## 2013-07-26 NOTE — Progress Notes (Signed)
CRT-P device check in clinic. Normal device function. Thresholds, sensing, and impedances consistent with previous measurements. Histograms appropriate for patient and level of activity. Permanent AF + Warfarin. No ventricular high rate episodes. 2 VS episodes---max dur. 5 sec---last 07-17-13---irreg VS---? AF with RVR. Patient bi-ventricularly pacing 94.5% of the time. Device programmed with appropriate safety margins. Estimated longevity 3.5 years. Patient will follow up with GT in 6 months.

## 2013-07-29 NOTE — Telephone Encounter (Signed)
AWAITING RETURN CALL FROM  [place]  [facility]  LEFT MESSAGE TO CALL BACK .Marland Kitchen/CY

## 2013-07-30 ENCOUNTER — Ambulatory Visit: Payer: Medicare Other | Admitting: Nurse Practitioner

## 2013-07-31 ENCOUNTER — Telehealth: Payer: Self-pay | Admitting: Cardiovascular Disease

## 2013-07-31 NOTE — Telephone Encounter (Signed)
LMTCB ./CY 

## 2013-07-31 NOTE — Telephone Encounter (Signed)
New message    Need cholesterol results from latest lab. Pt is in her heart failure program

## 2013-08-01 NOTE — Telephone Encounter (Signed)
LINDA  HAMILTON  WILL HAVE TO CALL BACK IF INFO  NEEDED  SEVERAL MESSAGES LEFT .Zack Seal

## 2013-08-03 ENCOUNTER — Other Ambulatory Visit: Payer: Self-pay | Admitting: Nurse Practitioner

## 2013-08-12 ENCOUNTER — Encounter: Payer: Medicare Other | Admitting: Surgery

## 2013-08-12 ENCOUNTER — Encounter: Payer: Self-pay | Admitting: Internal Medicine

## 2013-08-12 ENCOUNTER — Other Ambulatory Visit (HOSPITAL_COMMUNITY): Payer: Medicare Other

## 2013-08-23 ENCOUNTER — Encounter: Payer: Self-pay | Admitting: Surgery

## 2013-08-26 ENCOUNTER — Ambulatory Visit (INDEPENDENT_AMBULATORY_CARE_PROVIDER_SITE_OTHER): Payer: Medicare Other | Admitting: Surgery

## 2013-08-26 ENCOUNTER — Encounter: Payer: Self-pay | Admitting: Surgery

## 2013-08-26 ENCOUNTER — Ambulatory Visit (HOSPITAL_COMMUNITY)
Admission: RE | Admit: 2013-08-26 | Discharge: 2013-08-26 | Disposition: A | Discharge status: Home / Self Care | Payer: Medicare Other | Source: Ambulatory Visit | Attending: Surgery | Admitting: Surgery

## 2013-08-26 VITALS — BP 149/74 | HR 66 | Ht 67.0 in | Wt 201.0 lb

## 2013-08-26 DIAGNOSIS — N186 End stage renal disease: Secondary | ICD-10-CM

## 2013-08-26 DIAGNOSIS — Z4931 Encounter for adequacy testing for hemodialysis: Secondary | ICD-10-CM | POA: Insufficient documentation

## 2013-08-26 NOTE — Progress Notes (Signed)
Patient name: [name] MRN: [id number] DOB: 09-03-1934 Sex: female     Chief Complaint  Patient presents with  . Re-evaluation    6 wk f/u with duplex s/p 1st stage R BVT    HISTORY OF PRESENT ILLNESS: The patient is back today for followup.  She is status post first stage basilic vein creation on 06/21/2013.  She has no complaints of steal syndrome today.  Past Medical History  Diagnosis Date  . Cardiac pacemaker in situ   . Cellulitis and abscess of unspecified site     a. h/o MSRA cellulitis of abdominal wall.  Marland Kitchen CAD (coronary artery disease)     a. DES to OM 01/2005. b. Rotational atherotomy/DES to prox LAD 02/2008. c. Cath 07/2013: stable, for med rx.  . Edema   . Chronic renal insufficiency     Cr ~2.3.  . Warfarin anticoagulation     a. Followed by PCP.  Marland Kitchen GI bleed     a. EGD 01/2007: antral ulcers related to NSAID/aspirin use.  . Polycystic kidney disease     a. Per remote E-Chart records.  . Peripheral vascular disease   . Hyperthyroidism     a. Hx of intolerance to PTU.  Marland Kitchen Blood transfusion     no abnormal reaction  . H/O hiatal hernia   . Umbilical hernia     unrepaired (11/24/11)  . Arthritis   . Valvular heart disease     Echo 11/2011: mild-mod MR.  Marland Kitchen Hypertension     takes Amlodipine,Hydralazine,and Imdur daily  . Hyperlipidemia     takes Lovastatin daily  . Anemia, unspecified     takes an Iron Pill daily  . SSS (sick sinus syndrome)     a. H/o AV node ablation 2008 secondary to difficult to control afib/flutter, with subsequent pacer.  . Atrial fibrillation with rapid ventricular response     a. H/o amiodarone thyroiditis. b. s/p AV node ablation 2008 with implantation of PPM 01/2007, upgraded to CRT-P 06/2007.;takes Coumadin daily  . GERD (gastroesophageal reflux disease)     takes Protonix daily  . Chronic diastolic CHF (congestive heart failure)     takes Lasix daily and Spironolactone   . Myocardial infarction 1996  . Pacemaker    Medtronic  . Migraines     last migraine has been a while but headaches dail  . Chronic back pain     deteriorating disc  . History of gout   . Bruises easily   . Hemorrhoids   . History of kidney stones   . Depression     takes Xanax daily  . Insomnia     takes Xanax if needed    Past Surgical History  Procedure Laterality Date  . Esophagogastroduodenoscopy    . Tonsillectomy  ~ 1960  . Cardiac catheterization  2006; 2008; 2009; 01/13/2011  . Back surgery    . Laminectomy and microdiscectomy lumbar spine  08/2003  . Cardioversion  10/2004; 02/2007    /E-chart  . Cholecystectomy    . Appendectomy  1954  . Vaginal hysterectomy  ?1981  . Av node ablation  02/2007    /E-chart  . Insert / replace / remove pacemaker  01/2007    initial placement  . Insert / replace / remove pacemaker  06/2007    upgraded/E-chart  . Av fistula placement, brachiocephalic  02/622    right; "didn't work; my veins were too small"  . Joint replacement    .  Total knee arthroplasty  01/2009    left  . Bladder and bowel  1970's    "tacked; damaged my intestines & had to remove some; done @ [facility]  . Coronary angioplasty  2    percutanous transluminal   . Bilateral cataract surgery    . Bascilic vein transposition Right 06/21/2013    Procedure: BASCILIC VEIN TRANSPOSITION 1ST STAGE;  Surgeon: Serafina Mitchell, MD;  Location: [facility] OR;  Service: Vascular;  Laterality: Right;  . Esophagogastroduodenoscopy N/A 07/19/2013    Procedure: ESOPHAGOGASTRODUODENOSCOPY (EGD);  Surgeon: Lafayette Dragon, MD;  Location: [facility] ENDOSCOPY;  Service: Endoscopy;  Laterality: N/A;  . Balloon dilation N/A 07/19/2013    Procedure: BALLOON DILATION;  Surgeon: Lafayette Dragon, MD;  Location: [facility] ENDOSCOPY;  Service: Endoscopy;  Laterality: N/A;  . Esophagogastroduodenoscopy N/A 07/20/2013    Procedure: ESOPHAGOGASTRODUODENOSCOPY (EGD);  Surgeon: Lafayette Dragon, MD;  Location: [facility] ENDOSCOPY;  Service: Endoscopy;  Laterality: N/A;  with  removal of polyp in pylorus    History   Social History  . Marital Status: Widowed    Spouse Name: N/A    Number of Children: N/A  . Years of Education: N/A   Occupational History  . Not on file.   Social History Main Topics  . Smoking status: Former Smoker -- 1.00 packs/day for 50 years    Types: Cigarettes    Quit date: 07/13/1995  . Smokeless tobacco: Never Used     Comment: quit smoking around 1996  . Alcohol Use: No  . Drug Use: No  . Sexual Activity: No   Other Topics Concern  . Not on file   Social History Narrative   Lives in [place].    Family History  Problem Relation Age of Onset  . Colon cancer Neg Hx   . Thyroid disease Neg Hx   . Stroke Brother   . Hypertension Brother   . Kidney disease Brother   . Other Mother     Sepsis and perforated colon  . Other Father     Motor vehicle accident    Allergies as of 08/26/2013 - Review Complete 08/26/2013  Allergen Reaction Noted  . Amiodarone Hives   . Propylthiouracil Rash 11/23/2011    Current Outpatient Prescriptions on File Prior to Visit  Medication Sig Dispense Refill  . ALPRAZolam (XANAX) 0.5 MG tablet Take 0.5 mg by mouth 3 (three) times daily as needed.       Marland Kitchen aspirin EC 81 MG tablet Take 81 mg by mouth daily.      . ferrous sulfate 325 (65 FE) MG tablet Take 325 mg by mouth daily with breakfast.       . furosemide (LASIX) 40 MG tablet Take 2 tablets (80 mg total) by mouth daily.  30 tablet  11  . hydrALAZINE (APRESOLINE) 25 MG tablet TAKE 1 TABLET BY MOUTH 3 TIMES A DAY  90 tablet  3  . isosorbide mononitrate (IMDUR) 60 MG 24 hr tablet Take 60 mg by mouth 2 (two) times daily.      Marland Kitchen lovastatin (MEVACOR) 10 MG tablet Take 10 mg by mouth at bedtime.       . magnesium oxide (MAG-OX) 400 MG tablet Take 400 mg by mouth at bedtime.       . metolazone (ZAROXOLYN) 2.5 MG tablet TAKE  1  TAB  AS NEEDED  WITH    A  3 OR MORE  POUND  WEIGHT  GAIN IN  24 H OUR  PERIOD  30 tablet  6  . metoprolol tartrate  (LOPRESSOR) 25 MG tablet Take 3 tablets (75 mg total) by mouth 3 (three) times daily.  256 tablet  3  . nitroGLYCERIN (NITROSTAT) 0.4 MG SL tablet Place 0.4 mg under the tongue every 5 (five) minutes as needed for chest pain.      Marland Kitchen oxyCODONE-acetaminophen (PERCOCET/ROXICET) 5-325 MG per tablet Take 1 tablet by mouth 3 (three) times daily as needed for moderate pain. For pain  30 tablet  0  . pantoprazole (PROTONIX) 40 MG tablet Take 40 mg by mouth 2 (two) times daily.       . paricalcitol (ZEMPLAR) 1 MCG capsule Take 1 mcg by mouth at bedtime.      Marland Kitchen tetrahydrozoline 0.05 % ophthalmic solution Place 1 drop into both eyes 2 (two) times daily as needed (reddness).      . warfarin (COUMADIN) 2 MG tablet Pt takes  1 tablet (2mg ) on Tuesday, Wednesday, Thursday, Saturday, Sunday, and 1.5 tablets (3mg ) on Monday and Friday.      . [DISCONTINUED] FLUoxetine (PROZAC) 20 MG capsule Take 20 mg by mouth daily.         No current facility-administered medications on file prior to visit.     REVIEW OF SYSTEMS: No changes from prior visit  PHYSICAL EXAMINATION:   Vital signs are BP 149/74  Pulse 66  Ht 5\' 7"  (1.702 m)  Wt 201 lb (91.173 kg)  BMI 31.47 kg/m2  SpO2 98% General: The patient appears their stated age. HEENT:  No gross abnormalities Pulmonary:  Non labored breathing Musculoskeletal: There are no major deformities. Neurologic: No focal weakness or paresthesias are detected, Skin: There are no ulcer or rashes noted. Psychiatric: The patient has normal affect. Cardiovascular: Palpable thrill within right basilic vein fistula   Diagnostic Studies I have reviewed her ultrasound.  This shows excellent vein diameter.   Assessment: status post first stage basilic vein transposition Plan: The patient will be scheduled for a second stage right basilic vein transposition on Friday, February 6.  The risks and benefits of the procedure were discussed with the patient and she wishes to  proceed.  I did utilize a side branch off the main basilic vein.  She is on Coumadin.  This will be stopped 5 days prior to her procedure.  Eldridge Abrahams, M.D. [facility] Office: [phone] Pager:  [phone]

## 2013-09-04 ENCOUNTER — Ambulatory Visit (INDEPENDENT_AMBULATORY_CARE_PROVIDER_SITE_OTHER): Payer: Medicare Other | Admitting: Nurse Practitioner

## 2013-09-04 ENCOUNTER — Encounter: Payer: Self-pay | Admitting: Nurse Practitioner

## 2013-09-04 VITALS — BP 140/80 | HR 73 | Ht 67.0 in | Wt 204.1 lb

## 2013-09-04 DIAGNOSIS — I259 Chronic ischemic heart disease, unspecified: Secondary | ICD-10-CM

## 2013-09-04 LAB — CBC
HCT: 32.8 % — ABNORMAL LOW (ref 36.0–46.0)
Hemoglobin: 10.8 g/dL — ABNORMAL LOW (ref 12.0–15.0)
MCHC: 33 g/dL (ref 30.0–36.0)
MCV: 87.3 fl (ref 78.0–100.0)
Platelets: 153 10*3/uL (ref 150.0–400.0)
RBC: 3.76 Mil/uL — ABNORMAL LOW (ref 3.87–5.11)
RDW: 14.1 % (ref 11.5–14.6)
WBC: 6.7 10*3/uL (ref 4.5–10.5)

## 2013-09-04 LAB — BASIC METABOLIC PANEL
BUN: 46 mg/dL — ABNORMAL HIGH (ref 6–23)
CO2: 26 mEq/L (ref 19–32)
Calcium: 9.2 mg/dL (ref 8.4–10.5)
Chloride: 103 mEq/L (ref 96–112)
Creatinine, Ser: 2.9 mg/dL — ABNORMAL HIGH (ref 0.4–1.2)
GFR: 16.87 mL/min — ABNORMAL LOW (ref >60.00)
Glucose, Bld: 93 mg/dL (ref 70–99)
Potassium: 3.2 mEq/L — ABNORMAL LOW (ref 3.5–5.1)
Sodium: 139 mEq/L (ref 135–145)

## 2013-09-04 NOTE — Patient Instructions (Addendum)
Stay on your current medicines  We will recheck your labs today  See Dr. Johnsie Cancel in March  Call the [place] office at [phone] if you have any questions, problems or concerns.

## 2013-09-04 NOTE — Progress Notes (Signed)
[name] Date of Birth: 06-Mar-1935 Medical Record #[id number]  History of Present Illness: [name] is a 79 y.o. female with a hx of CAD, s/p DES to the OM in 6/06, rotational atherectomy + DES to pLAD in 7/09, symptomatic bradycardia status post pacemaker with upgrade to CRT-P 11/08, atrial fibrillation with prior AV node ablation (prior amiodarone thyroiditis), chronic diastolic CHF, mitral regurgitation, chronic kidney disease stage IV (followed by Dr. Phineas Semen), HTN, HL, GERD, prior upper GI bleed in 2008 related to NSAID use, and PVD.   Echo (03/2013): Mild focal basal septal hypertrophy, EF 55-60%, mild MR, moderate to severe LAE, mild to moderate RVE, reduced RVSF, moderate RAE.   She has most recently been followed closely with chest pain. Medications have been adjusted. Dr. Johnsie Cancel has been trying to avoid cardiac catheterization due to chronic renal failure. Adequate access was obtained and she did end up being recathed by Dr. Burt Knack  After failing on medical therapy - no critical disease and Cr remained stable.   Seen 6 weeks ago by Dr. Johnsie Cancel - seemed to be doing ok. Had just been discharged with dysphagia and had EGD showing large prepyloric ulcer with subsequent removal after stopping anticoagulation. Her Norvasc was stopped (looks like due to swelling) and lasix was increased at that visit.   Comes back today. Here with her son. She is doing ok. Says she is "a whole lot better" in regards to her chest pain - actually wanting to cut medicines back. Breathing ok. Swelling better with stopping Norvasc. No falls. Not dizzy. Seems to be holding her own. Weight stable at home. BP lower at home.    Current Outpatient Prescriptions  Medication Sig Dispense Refill  . ALPRAZolam (XANAX) 0.5 MG tablet Take 0.5 mg by mouth 3 (three) times daily as needed.       Marland Kitchen aspirin EC 81 MG tablet Take 81 mg by mouth daily.      . ferrous sulfate 325 (65 FE) MG tablet Take 325 mg by  mouth daily with breakfast.       . furosemide (LASIX) 40 MG tablet Take 2 tablets (80 mg total) by mouth daily.  30 tablet  11  . hydrALAZINE (APRESOLINE) 25 MG tablet TAKE 1 TABLET BY MOUTH 3 TIMES A DAY  90 tablet  3  . isosorbide mononitrate (IMDUR) 60 MG 24 hr tablet Take 60 mg by mouth 2 (two) times daily.      Marland Kitchen lovastatin (MEVACOR) 10 MG tablet Take 10 mg by mouth at bedtime.       . magnesium oxide (MAG-OX) 400 MG tablet Take 400 mg by mouth at bedtime.       . metolazone (ZAROXOLYN) 2.5 MG tablet TAKE  1  TAB  AS NEEDED  WITH    A  3 OR MORE  POUND  WEIGHT  GAIN IN  24 H OUR PERIOD  30 tablet  6  . metoprolol tartrate (LOPRESSOR) 25 MG tablet Take 3 tablets (75 mg total) by mouth 3 (three) times daily.  256 tablet  3  . nitroGLYCERIN (NITROSTAT) 0.4 MG SL tablet Place 0.4 mg under the tongue every 5 (five) minutes as needed for chest pain.      Marland Kitchen oxyCODONE-acetaminophen (PERCOCET/ROXICET) 5-325 MG per tablet Take 1 tablet by mouth 3 (three) times daily as needed for moderate pain. For pain  30 tablet  0  . pantoprazole (PROTONIX) 40 MG tablet Take 40 mg by mouth 2 (two) times  daily.       . paricalcitol (ZEMPLAR) 1 MCG capsule Take 1 mcg by mouth at bedtime.      Marland Kitchen tetrahydrozoline 0.05 % ophthalmic solution Place 1 drop into both eyes 2 (two) times daily as needed (reddness).      . warfarin (COUMADIN) 2 MG tablet Pt takes  1 tablet (2mg ) on Tuesday, Wednesday, Thursday, Saturday, Sunday, and 1.5 tablets (3mg ) on Monday and Friday.      . [DISCONTINUED] FLUoxetine (PROZAC) 20 MG capsule Take 20 mg by mouth daily.         No current facility-administered medications for this visit.    Allergies  Allergen Reactions  . Amiodarone Hives  . Propylthiouracil Rash    Past Medical History  Diagnosis Date  . Cardiac pacemaker in situ   . Cellulitis and abscess of unspecified site     a. h/o MSRA cellulitis of abdominal wall.  Marland Kitchen CAD (coronary artery disease)     a. DES to OM 01/2005.  b. Rotational atherotomy/DES to prox LAD 02/2008. c. Cath 07/2013: stable, for med rx.  . Edema   . Chronic renal insufficiency     Cr ~2.3.  . Warfarin anticoagulation     a. Followed by PCP.  Marland Kitchen GI bleed     a. EGD 01/2007: antral ulcers related to NSAID/aspirin use.  . Polycystic kidney disease     a. Per remote E-Chart records.  . Peripheral vascular disease   . Hyperthyroidism     a. Hx of intolerance to PTU.  Marland Kitchen Blood transfusion     no abnormal reaction  . H/O hiatal hernia   . Umbilical hernia     unrepaired (11/24/11)  . Arthritis   . Valvular heart disease     Echo 11/2011: mild-mod MR.  Marland Kitchen Hypertension     takes Amlodipine,Hydralazine,and Imdur daily  . Hyperlipidemia     takes Lovastatin daily  . Anemia, unspecified     takes an Iron Pill daily  . SSS (sick sinus syndrome)     a. H/o AV node ablation 2008 secondary to difficult to control afib/flutter, with subsequent pacer.  . Atrial fibrillation with rapid ventricular response     a. H/o amiodarone thyroiditis. b. s/p AV node ablation 2008 with implantation of PPM 01/2007, upgraded to CRT-P 06/2007.;takes Coumadin daily  . GERD (gastroesophageal reflux disease)     takes Protonix daily  . Chronic diastolic CHF (congestive heart failure)     takes Lasix daily and Spironolactone   . Myocardial infarction 1996  . Pacemaker     Medtronic  . Migraines     last migraine has been a while but headaches dail  . Chronic back pain     deteriorating disc  . History of gout   . Bruises easily   . Hemorrhoids   . History of kidney stones   . Depression     takes Xanax daily  . Insomnia     takes Xanax if needed    Past Surgical History  Procedure Laterality Date  . Esophagogastroduodenoscopy    . Tonsillectomy  ~ 1960  . Cardiac catheterization  2006; 2008; 2009; 01/13/2011  . Back surgery    . Laminectomy and microdiscectomy lumbar spine  08/2003  . Cardioversion  10/2004; 02/2007    /E-chart  . Cholecystectomy    .  Appendectomy  1954  . Vaginal hysterectomy  ?1981  . Av node ablation  02/2007    /E-chart  .  Insert / replace / remove pacemaker  01/2007    initial placement  . Insert / replace / remove pacemaker  06/2007    upgraded/E-chart  . Av fistula placement, brachiocephalic  09/2007    right; "didn't work; my veins were too small"  . Joint replacement    . Total knee arthroplasty  01/2009    left  . Bladder and bowel  1970's    "tacked; damaged my intestines & had to remove some; done @ [facility]  . Coronary angioplasty  2    percutanous transluminal   . Bilateral cataract surgery    . Bascilic vein transposition Right 06/21/2013    Procedure: BASCILIC VEIN TRANSPOSITION 1ST STAGE;  Surgeon: Nada Libman, MD;  Location: [facility] OR;  Service: Vascular;  Laterality: Right;  . Esophagogastroduodenoscopy N/A 07/19/2013    Procedure: ESOPHAGOGASTRODUODENOSCOPY (EGD);  Surgeon: Hart Carwin, MD;  Location: [facility] ENDOSCOPY;  Service: Endoscopy;  Laterality: N/A;  . Balloon dilation N/A 07/19/2013    Procedure: BALLOON DILATION;  Surgeon: Hart Carwin, MD;  Location: [facility] ENDOSCOPY;  Service: Endoscopy;  Laterality: N/A;  . Esophagogastroduodenoscopy N/A 07/20/2013    Procedure: ESOPHAGOGASTRODUODENOSCOPY (EGD);  Surgeon: Hart Carwin, MD;  Location: [facility] ENDOSCOPY;  Service: Endoscopy;  Laterality: N/A;  with removal of polyp in pylorus    History  Smoking status  . Former Smoker -- 1.00 packs/day for 50 years  . Types: Cigarettes  . Quit date: 07/13/1995  Smokeless tobacco  . Never Used    Comment: quit smoking around 1996    History  Alcohol Use No    Family History  Problem Relation Age of Onset  . Colon cancer Neg Hx   . Thyroid disease Neg Hx   . Stroke Brother   . Hypertension Brother   . Kidney disease Brother   . Other Mother     Sepsis and perforated colon  . Other Father     Motor vehicle accident    Review of Systems: The review of systems is per the HPI.  All other systems  were reviewed and are negative.  Physical Exam: BP 140/80  Pulse 73  Ht 5\' 7"  (1.702 m)  Wt 204 lb 1.9 oz (92.588 kg)  BMI 31.96 kg/m2  SpO2 99% Patient is very pleasant and in no acute distress. She is obese. Skin is warm and dry. Color is normal.  HEENT is unremarkable. Normocephalic/atraumatic. PERRL. Sclera are nonicteric. Neck is supple. No masses. No JVD. Lungs are clear. Cardiac exam shows a regular rate and rhythm. Abdomen is soft. Extremities are full but without significant edema. She has been scratching her legs - multiple places noted from scratching.  Gait and ROM are intact but moving slow. No gross neurologic deficits noted.  Wt Readings from Last 3 Encounters:  09/04/13 204 lb 1.9 oz (92.588 kg)  08/26/13 201 lb (91.173 kg)  07/24/13 208 lb (94.348 kg)    LABORATORY DATA: PENDING  Lab Results  Component Value Date   WBC 5.4 07/19/2013   HGB 10.1* 07/19/2013   HCT 30.3* 07/19/2013   PLT 148* 07/19/2013   GLUCOSE 89 07/22/2013   ALT 15 06/02/2012   AST 18 06/02/2012   NA 139 07/22/2013   K 3.9 07/22/2013   CL 101 07/22/2013   CREATININE 3.2* 07/22/2013   BUN 44* 07/22/2013   CO2 23 07/22/2013   TSH 4.93 07/09/2012   INR 1.44 07/21/2013   Coronary angiography:  Coronary dominance: right  Left  mainstem: The left main stem is calcified with no obstructive disease. It arises from the left cusp it divides into the LAD and left circumflex.  Left anterior descending (LAD): The LAD has diffuse irregularity without high-grade stenosis. The stented segment in the proximal LAD is patent without significant stenosis. Beyond the stented segment there is a 30-40% stenosis. The diagonal branches are patent without significant disease. The first septal perforator has 80% ostial stenosis.  Left circumflex (LCx): The circumflex is patent throughout its course. The first obtuse marginal has a stent in its proximal portion with no significant obstruction. There is 20% diffuse in-stent  restenosis. The AV circumflex is patent with mild calcification and 20-30% stenosis.  Right coronary artery (RCA): The right coronary artery is dominant. The vessel has mild nonobstructive stenosis in the proximal and midportion of no more than 30-40%. There is moderate calcification present. The PDA and PLA branches are widely patent.  Left ventriculography: Deferred because of advanced kidney disease.  Final Conclusions:  1. Diffuse nonobstructive coronary artery disease with continued patency of the stented segments in the proximal LAD and first obtuse marginal branches  2. Mildly elevated left ventricular end-diastolic pressure  Recommendations: Continued medical therapy. The patient will complete 6 hours of postprocedure sodium bicarbonate infusion as per protocol. Repeat metabolic panel in the morning and anticipate discharge tomorrow.  Total contrast administered: 17 cc  Sherren Mocha  07/15/2013, 4:39 PM   Assessment / Plan: 1. CAD - recent cath with stable findings - manage medically. I have left her on her current regimen with no changes made today.  2. Swelling - improved with stopping Norvasc - BP ok  3. HTN - BP ok - lower at home  4. CKD - nearing dialysis - recheck her labs today  5. Dysphagia/prepyloric ulcer - seeing GI  She seems to be holding her own for now - overall prognosis looks tenuous. Recheck her labs today. She has follow up in March with Dr. Johnsie Cancel.  Patient is agreeable to this plan and will call if any problems develop in the interim.   Burtis Junes, RN, [place] [address] [place], [place]  [phone]

## 2013-09-06 ENCOUNTER — Telehealth: Payer: Self-pay | Admitting: Nurse Practitioner

## 2013-09-06 ENCOUNTER — Telehealth: Payer: Self-pay | Admitting: *Deleted

## 2013-09-06 ENCOUNTER — Other Ambulatory Visit: Payer: Self-pay

## 2013-09-06 DIAGNOSIS — I509 Heart failure, unspecified: Secondary | ICD-10-CM

## 2013-09-06 MED ORDER — POTASSIUM CHLORIDE ER 10 MEQ PO TBCR: 10.0000 meq | EXTENDED_RELEASE_TABLET | Freq: Every day | ORAL | Status: DC

## 2013-09-06 MED ORDER — POTASSIUM CHLORIDE CRYS ER 20 MEQ PO TBCR: 20.0000 meq | EXTENDED_RELEASE_TABLET | Freq: Every day | ORAL | Status: DC

## 2013-09-06 NOTE — Telephone Encounter (Signed)
New message    Returning [place] call

## 2013-09-06 NOTE — Telephone Encounter (Signed)
Message copied by Rodman Key on Fri Sep 06, 2013 12:12 PM ------      Message from: Burtis Junes      Created: Wed Sep 04, 2013  4:04 PM       Ok to report. Kidney function looks stable. Anemia is stable. Potassium is a little low - would add KCL 10 meq daily      Recheck in 2 weeks with BMET      Copy of her labs to her kidney doctor please. ------

## 2013-09-06 NOTE — Telephone Encounter (Signed)
Spoke with son, informed of lab results, begin potassium chloride and recheck bmet in 2 weeks.  Pt is scheduled to have AV fistula revision at [facility] on 2/6.  Informed son they may do blood work before that procedure so if they do he can call to cancel lab appointment here on 2/9.

## 2013-09-11 ENCOUNTER — Encounter (HOSPITAL_COMMUNITY): Payer: Self-pay | Admitting: Pharmacy Technician

## 2013-09-19 ENCOUNTER — Encounter (HOSPITAL_COMMUNITY): Payer: Self-pay

## 2013-09-19 ENCOUNTER — Other Ambulatory Visit: Payer: Medicare Other

## 2013-09-19 ENCOUNTER — Encounter (HOSPITAL_COMMUNITY)
Admission: RE | Admit: 2013-09-19 | Discharge: 2013-09-19 | Disposition: A | Discharge status: Home / Self Care | Payer: Medicare Other | Source: Ambulatory Visit | Attending: Surgery | Admitting: Surgery

## 2013-09-19 LAB — SURGICAL PCR SCREEN
MRSA, PCR: NEGATIVE
STAPHYLOCOCCUS AUREUS: NEGATIVE

## 2013-09-19 MED ORDER — DEXTROSE 5 % IV SOLN
1.5000 g | INTRAVENOUS | Status: AC
  Administered 2013-09-20: 1.5 g via INTRAVENOUS
  Filled 2013-09-19: qty 1.5

## 2013-09-19 NOTE — Progress Notes (Signed)
Anesthesia Chart Review:  Patient is a 79 year old female scheduled for 2nd stage BVT on 09/20/13 by Dr. Trula Slade.  See my note from 06/18/13.    She has had cardiac cath since then on 07/15/13 (done for chest pain) that showed: Coronary angiography:  Coronary dominance: right  Left mainstem: The left main stem is calcified with no obstructive disease. It arises from the left cusp it divides into the LAD and left circumflex.  Left anterior descending (LAD): The LAD has diffuse irregularity without high-grade stenosis. The stented segment in the proximal LAD is patent without significant stenosis. Beyond the stented segment there is a 30-40% stenosis. The diagonal branches are patent without significant disease. The first septal perforator has 80% ostial stenosis.  Left circumflex (LCx): The circumflex is patent throughout its course. The first obtuse marginal has a stent in its proximal portion with no significant obstruction. There is 20% diffuse in-stent restenosis. The AV circumflex is patent with mild calcification and 20-30% stenosis.  Right coronary artery (RCA): The right coronary artery is dominant. The vessel has mild nonobstructive stenosis in the proximal and midportion of no more than 30-40%. There is moderate calcification present. The PDA and PLA branches are widely patent.  Left ventriculography: Deferred because of advanced kidney disease.  Final Conclusions:  1. Diffuse nonobstructive coronary artery disease with continued patency of the stented segments in the proximal LAD and first obtuse marginal branches  2. Mildly elevated left ventricular end-diastolic pressure  Recommendations: Continued medical therapy.  By notes, she also was seen by GI for dysphagia and had an EGD on 07/19/14 that showed large prepyloric ulcer which was not removed as patient on anticoagulation and so a repeat EGD Done on 12/6 with removal of the polyp and dilatation of esophagus given suspicion for  presbyesophagus. Polyp sent for biopsy. Symptoms resolved after dilatation and now tolerating regular diet.  She was last seen by cardiology 09/04/13 with continued medical therapy recommended after cath in 07/2013.  Last CXR and EKG in Epic noted. PPM RX form is still pending from [facility]. Dr. Trula Slade is having her hold Coumadin for five days preoperatively. She is for labs on arrival.  If acceptable and otherwise no acute changes then I would anticipate that she could proceed as planned.  George Hugh [facility] Short Stay Center/Anesthesiology Phone [phone] 09/19/2013 1:50 PM

## 2013-09-19 NOTE — Pre-Procedure Instructions (Addendum)
[name]  09/19/2013   Your procedure is scheduled on:  Friday, September 20, 2013  Report to [facility] Entrance "A" [address] at 7:30 AM.  Call this number if you have problems the morning of surgery: [phone]   Remember:   Do not eat food or drink liquids after midnight.   Take these medicines the morning of surgery with A SIP OF WATER: Alprazolam (Xanax), hydralazine (Apresoline), isosorbide mononitrate (Imdur), metoprolol (Lopressor), oxycodone-acetaminophen (Percocet), pantoprazole (Protonix)  STOP taking warfarin (Coumadin), Aspirin, Goody's, BC's, Aleve (Naproxen), Ibuprofen (Advil or Motrin), Fish Oil, Vitamins, Herbal Supplements or any substance that could thin your blood starting today.   Do not wear jewelry, make-up or nail polish.  Do not wear lotions, powders, or perfumes. You may wear deodorant.  Do not shave 48 hours prior to surgery.  Do not bring valuables to the hospital.  [facility] is not responsible                  for any belongings or valuables.               Contacts, dentures or bridgework may not be worn into surgery.  Leave suitcase in the car. After surgery it may be brought to your room.  For patients admitted to the hospital, discharge time is determined by your                treatment team.               Patients discharged the day of surgery will not be allowed to drive  home.     Special Instructions: Please use CHG soap the night before surgery and the day of surgery. CHG soap should be used atleast twice.   Please read over the following fact sheets that you were given: Pain Booklet, Coughing and Deep Breathing, MRSA Information and Surgical Site Infection Prevention.

## 2013-09-20 ENCOUNTER — Encounter (HOSPITAL_COMMUNITY): Payer: Self-pay | Admitting: Surgery

## 2013-09-20 ENCOUNTER — Ambulatory Visit (HOSPITAL_COMMUNITY)
Admission: RE | Admit: 2013-09-20 | Discharge: 2013-09-20 | Disposition: A | Discharge status: Home / Self Care | Payer: Medicare Other | Source: Ambulatory Visit | Attending: Surgery | Admitting: Surgery

## 2013-09-20 ENCOUNTER — Encounter (HOSPITAL_COMMUNITY): Payer: Medicare Other | Admitting: Vascular Surgery

## 2013-09-20 ENCOUNTER — Ambulatory Visit (HOSPITAL_COMMUNITY): Payer: Medicare Other | Admitting: Anesthesiology

## 2013-09-20 ENCOUNTER — Encounter (HOSPITAL_COMMUNITY)
Admission: RE | Disposition: A | Discharge status: Home / Self Care | Payer: Self-pay | Source: Ambulatory Visit | Attending: Surgery

## 2013-09-20 DIAGNOSIS — I2510 Atherosclerotic heart disease of native coronary artery without angina pectoris: Secondary | ICD-10-CM | POA: Insufficient documentation

## 2013-09-20 DIAGNOSIS — F329 Major depressive disorder, single episode, unspecified: Secondary | ICD-10-CM | POA: Insufficient documentation

## 2013-09-20 DIAGNOSIS — E785 Hyperlipidemia, unspecified: Secondary | ICD-10-CM | POA: Insufficient documentation

## 2013-09-20 DIAGNOSIS — Z7901 Long term (current) use of anticoagulants: Secondary | ICD-10-CM | POA: Insufficient documentation

## 2013-09-20 DIAGNOSIS — N186 End stage renal disease: Secondary | ICD-10-CM

## 2013-09-20 DIAGNOSIS — I129 Hypertensive chronic kidney disease with stage 1 through stage 4 chronic kidney disease, or unspecified chronic kidney disease: Secondary | ICD-10-CM | POA: Insufficient documentation

## 2013-09-20 DIAGNOSIS — I5032 Chronic diastolic (congestive) heart failure: Secondary | ICD-10-CM | POA: Insufficient documentation

## 2013-09-20 DIAGNOSIS — I495 Sick sinus syndrome: Secondary | ICD-10-CM | POA: Insufficient documentation

## 2013-09-20 DIAGNOSIS — E0590 Thyrotoxicosis, unspecified without thyrotoxic crisis or storm: Secondary | ICD-10-CM | POA: Insufficient documentation

## 2013-09-20 DIAGNOSIS — D649 Anemia, unspecified: Secondary | ICD-10-CM | POA: Insufficient documentation

## 2013-09-20 DIAGNOSIS — K219 Gastro-esophageal reflux disease without esophagitis: Secondary | ICD-10-CM | POA: Insufficient documentation

## 2013-09-20 DIAGNOSIS — I4891 Unspecified atrial fibrillation: Secondary | ICD-10-CM | POA: Insufficient documentation

## 2013-09-20 DIAGNOSIS — Z950 Presence of cardiac pacemaker: Secondary | ICD-10-CM | POA: Insufficient documentation

## 2013-09-20 DIAGNOSIS — N189 Chronic kidney disease, unspecified: Secondary | ICD-10-CM | POA: Insufficient documentation

## 2013-09-20 DIAGNOSIS — F3289 Other specified depressive episodes: Secondary | ICD-10-CM | POA: Insufficient documentation

## 2013-09-20 DIAGNOSIS — I252 Old myocardial infarction: Secondary | ICD-10-CM | POA: Insufficient documentation

## 2013-09-20 DIAGNOSIS — I509 Heart failure, unspecified: Secondary | ICD-10-CM | POA: Insufficient documentation

## 2013-09-20 HISTORY — PX: BASCILIC VEIN TRANSPOSITION: SHX5742

## 2013-09-20 LAB — PROTIME-INR
INR: 1.27 (ref 0.00–1.49)
PROTHROMBIN TIME: 15.6 s — AB (ref 11.6–15.2)

## 2013-09-20 LAB — POCT I-STAT 4, (NA,K, GLUC, HGB,HCT)
GLUCOSE: 99 mg/dL (ref 70–99)
HCT: 35 % — ABNORMAL LOW (ref 36.0–46.0)
Hemoglobin: 11.9 g/dL — ABNORMAL LOW (ref 12.0–15.0)
POTASSIUM: 3.1 meq/L — AB (ref 3.7–5.3)
Sodium: 141 mEq/L (ref 137–147)

## 2013-09-20 LAB — APTT: APTT: 34 s (ref 24–37)

## 2013-09-20 SURGERY — TRANSPOSITION, VEIN, BASILIC
Anesthesia: Monitor Anesthesia Care | Site: Arm Upper | Laterality: Right

## 2013-09-20 MED ORDER — HYDROMORPHONE HCL PF 1 MG/ML IJ SOLN
0.2500 mg | INTRAMUSCULAR | Status: DC | PRN
  Administered 2013-09-20 (×4): 0.5 mg via INTRAVENOUS

## 2013-09-20 MED ORDER — OXYCODONE-ACETAMINOPHEN 5-325 MG PO TABS: 1.0000 | ORAL_TABLET | Freq: Three times a day (TID) | ORAL | Status: DC | PRN

## 2013-09-20 MED ORDER — 0.9 % SODIUM CHLORIDE (POUR BTL) OPTIME
TOPICAL | Status: DC | PRN
  Administered 2013-09-20: 1000 mL

## 2013-09-20 MED ORDER — FENTANYL CITRATE 0.05 MG/ML IJ SOLN
INTRAMUSCULAR | Status: AC
  Filled 2013-09-20: qty 5

## 2013-09-20 MED ORDER — FENTANYL CITRATE 0.05 MG/ML IJ SOLN
INTRAMUSCULAR | Status: DC | PRN
  Administered 2013-09-20: 25 ug via INTRAVENOUS
  Administered 2013-09-20 (×3): 50 ug via INTRAVENOUS
  Administered 2013-09-20: 25 ug via INTRAVENOUS

## 2013-09-20 MED ORDER — HYDROMORPHONE HCL PF 1 MG/ML IJ SOLN
INTRAMUSCULAR | Status: AC
  Filled 2013-09-20: qty 1

## 2013-09-20 MED ORDER — ONDANSETRON HCL 4 MG/2ML IJ SOLN
4.0000 mg | Freq: Once | INTRAMUSCULAR | Status: AC | PRN
  Administered 2013-09-20: 4 mg via INTRAVENOUS

## 2013-09-20 MED ORDER — PROPOFOL 10 MG/ML IV BOLUS
INTRAVENOUS | Status: DC | PRN
  Administered 2013-09-20: 110 mg via INTRAVENOUS

## 2013-09-20 MED ORDER — SODIUM CHLORIDE 0.9 % IV SOLN
INTRAVENOUS | Status: DC
  Administered 2013-09-20 (×2): via INTRAVENOUS

## 2013-09-20 MED ORDER — SODIUM CHLORIDE 0.9 % IR SOLN
Status: DC | PRN
  Administered 2013-09-20: 11:00:00

## 2013-09-20 MED ORDER — LIDOCAINE HCL (CARDIAC) 20 MG/ML IV SOLN
INTRAVENOUS | Status: DC | PRN
  Administered 2013-09-20: 50 mg via INTRAVENOUS

## 2013-09-20 MED ORDER — HEPARIN SODIUM (PORCINE) 1000 UNIT/ML IJ SOLN
INTRAMUSCULAR | Status: DC | PRN
  Administered 2013-09-20: 3000 [IU] via INTRAVENOUS

## 2013-09-20 MED ORDER — ONDANSETRON HCL 4 MG/2ML IJ SOLN
INTRAMUSCULAR | Status: AC
  Administered 2013-09-20: 4 mg via INTRAVENOUS
  Filled 2013-09-20: qty 2

## 2013-09-20 MED ORDER — PROTAMINE SULFATE 10 MG/ML IV SOLN
INTRAVENOUS | Status: DC | PRN
  Administered 2013-09-20 (×5): 10 mg via INTRAVENOUS

## 2013-09-20 MED ORDER — PHENYLEPHRINE HCL 10 MG/ML IJ SOLN
INTRAMUSCULAR | Status: DC | PRN
  Administered 2013-09-20 (×3): 80 ug via INTRAVENOUS

## 2013-09-20 MED ORDER — PROPOFOL 10 MG/ML IV BOLUS
INTRAVENOUS | Status: AC
  Filled 2013-09-20: qty 20

## 2013-09-20 SURGICAL SUPPLY — 49 items
CANISTER SUCTION 2500CC (MISCELLANEOUS) ×3 IMPLANT
CLIP TI MEDIUM 24 (CLIP) ×3 IMPLANT
CLIP TI MEDIUM 6 (CLIP) IMPLANT
CLIP TI WIDE RED SMALL 24 (CLIP) ×3 IMPLANT
CLIP TI WIDE RED SMALL 6 (CLIP) IMPLANT
COVER PROBE W GEL 5X96 (DRAPES) ×3 IMPLANT
COVER SURGICAL LIGHT HANDLE (MISCELLANEOUS) ×3 IMPLANT
DERMABOND ADVANCED (GAUZE/BANDAGES/DRESSINGS) ×4
DERMABOND ADVANCED .7 DNX12 (GAUZE/BANDAGES/DRESSINGS) ×2 IMPLANT
ELECT REM PT RETURN 9FT ADLT (ELECTROSURGICAL) ×3
ELECTRODE REM PT RTRN 9FT ADLT (ELECTROSURGICAL) ×1 IMPLANT
GLOVE BIO SURGEON STRL SZ 6.5 (GLOVE) ×6 IMPLANT
GLOVE BIO SURGEONS STRL SZ 6.5 (GLOVE) ×3
GLOVE BIOGEL PI IND STRL 6.5 (GLOVE) ×2 IMPLANT
GLOVE BIOGEL PI IND STRL 7.0 (GLOVE) ×1 IMPLANT
GLOVE BIOGEL PI IND STRL 7.5 (GLOVE) ×1 IMPLANT
GLOVE BIOGEL PI IND STRL 8 (GLOVE) ×1 IMPLANT
GLOVE BIOGEL PI INDICATOR 6.5 (GLOVE) ×4
GLOVE BIOGEL PI INDICATOR 7.0 (GLOVE) ×2
GLOVE BIOGEL PI INDICATOR 7.5 (GLOVE) ×2
GLOVE BIOGEL PI INDICATOR 8 (GLOVE) ×2
GLOVE ECLIPSE 6.0 STRL STRAW (GLOVE) ×3 IMPLANT
GLOVE ECLIPSE 6.5 STRL STRAW (GLOVE) ×3 IMPLANT
GLOVE SURG SS PI 7.5 STRL IVOR (GLOVE) ×3 IMPLANT
GOWN STRL REUS W/ TWL LRG LVL3 (GOWN DISPOSABLE) ×2 IMPLANT
GOWN STRL REUS W/ TWL XL LVL3 (GOWN DISPOSABLE) ×1 IMPLANT
GOWN STRL REUS W/TWL LRG LVL3 (GOWN DISPOSABLE) ×4
GOWN STRL REUS W/TWL XL LVL3 (GOWN DISPOSABLE) ×2
HEMOSTAT SNOW SURGICEL 2X4 (HEMOSTASIS) IMPLANT
KIT BASIN OR (CUSTOM PROCEDURE TRAY) ×3 IMPLANT
KIT ROOM TURNOVER OR (KITS) ×3 IMPLANT
LOOP VESSEL MAXI BLUE (MISCELLANEOUS) ×3 IMPLANT
MARKER SKIN DUAL TIP RULER LAB (MISCELLANEOUS) ×3 IMPLANT
NS IRRIG 1000ML POUR BTL (IV SOLUTION) ×3 IMPLANT
PACK CV ACCESS (CUSTOM PROCEDURE TRAY) ×3 IMPLANT
PAD ARMBOARD 7.5X6 YLW CONV (MISCELLANEOUS) ×6 IMPLANT
SPONGE LAP 18X18 X RAY DECT (DISPOSABLE) ×3 IMPLANT
SUT PROLENE 6 0 CC (SUTURE) ×3 IMPLANT
SUT SILK 2 0 SH (SUTURE) ×3 IMPLANT
SUT SILK 3 0 (SUTURE) ×2
SUT SILK 3-0 18XBRD TIE 12 (SUTURE) ×1 IMPLANT
SUT VIC AB 3-0 SH 27 (SUTURE) ×6
SUT VIC AB 3-0 SH 27X BRD (SUTURE) ×3 IMPLANT
SUT VICRYL 4-0 PS2 18IN ABS (SUTURE) ×9 IMPLANT
SYR 30ML LL (SYRINGE) ×3 IMPLANT
TOWEL OR 17X24 6PK STRL BLUE (TOWEL DISPOSABLE) ×3 IMPLANT
TOWEL OR 17X26 10 PK STRL BLUE (TOWEL DISPOSABLE) ×3 IMPLANT
UNDERPAD 30X30 INCONTINENT (UNDERPADS AND DIAPERS) ×3 IMPLANT
WATER STERILE IRR 1000ML POUR (IV SOLUTION) ×3 IMPLANT

## 2013-09-20 NOTE — Interval H&P Note (Signed)
History and Physical Interval Note:  09/20/2013 10:47 AM  [name]  has presented today for surgery, with the diagnosis of End Stage Renal Disease  The various methods of treatment have been discussed with the patient and family. After consideration of risks, benefits and other options for treatment, the patient has consented to  Procedure(s): [place] (Right) as a surgical intervention .  The patient's history has been reviewed, patient examined, no change in status, stable for surgery.  I have reviewed the patient's chart and labs.  Questions were answered to the patient's satisfaction.     BRABHAM IV, V. WELLS

## 2013-09-20 NOTE — Op Note (Signed)
    Patient name: DONNITA FARINA MRN: [id number] DOB: Aug 09, 1935 Sex: female  09/20/2013 Pre-operative Diagnosis: End stage renal disease Post-operative diagnosis:  Same Surgeon:  Eldridge Abrahams Assistants:  Leontine Locket Procedure:   Second stage right arm basilic vein transposition Anesthesia:  Gen. Blood Loss:  See anesthesia record Specimens:  None  Findings:  Excellent caliber vein  Indications:  The patient has previously undergone a first stage basilic vein transposition.  By ultrasound, the vein has matured nicely.  She comes in for the definitive procedure.  Procedure:  The patient was identified in the holding area and taken to [place] [facility]  The patient was then placed supine on the table. general anesthesia was administered.  The patient was prepped and draped in the usual sterile fashion.  A time out was called and antibiotics were administered.  The previous longitudinal incision below the antecubital crease was opened with a 10 blade.  Sharp dissection was used to isolate the basilic vein.  I traced the vein distally to the branch that was connected to the right brachial artery.  The basilic vein was then fully mobilized through this incision and then through 2 additional longitudinal incisions moving up the upper arm.  All side branches were ligated between silk ties.  I traced the vein back to the junction to the deep system which was up near the axilla.  I then marked the vein with an ink pen for orientation.  Through the incision just proximal to the antecubital crease, I dissected out the brachial artery.  This was a 3.5 mm artery was disease-free.  Next, the distal end of the basilic vein was ligated with a silk ties.  The vein was then distended with heparinized saline.  Distended 2 5 x 6 mm.  It was marked again with an ink pen for proper orientation.  I then created a tunnel between the artery exposure site and the axilla.  Making sure that there was a good transition  into the axilla.  The vein was then brought through the tunnel.  The patient was fully heparinized.  The brachial artery was then occluded with vascular clamps after the heparin had circulated.  A #11 blade was used to make an arteriotomy which was extended longitudinally with Potts scissors.  The vein was cut to the appropriate length and then spatulated to fit the size of the arteriotomy.  A running anastomosis was then created with 6-0 Prolene.  Prior to completion, the appropriate flushing maneuvers were performed.  The anastomosis was then completed.  There was an excellent thrill within the fistula.  She had preservation of Doppler signals within her ulnar and radial artery.  There was not a significant change with graft compression.  Next, 50 mg of protamine was administered.  The incisions were then closed by reapproximating the deep layer with 3-0 Vicryl and the skin with 4-0 Vicryl.  The patient tolerated the procedure well there no complications.  Dermabond was placed on the skin.   Disposition:  To PACU in stable condition.   Theotis Burrow, M.D. [facility] Office: [phone] Pager:  [phone]

## 2013-09-20 NOTE — Anesthesia Preprocedure Evaluation (Signed)
Anesthesia Evaluation  Patient identified by MRN, date of birth, ID band Patient awake    Reviewed: Allergy & Precautions, H&P , NPO status , Patient's Chart, lab work & pertinent test results  Airway       Dental   Pulmonary former smoker,          Cardiovascular hypertension, + CAD, + Past MI, + Cardiac Stents, + Peripheral Vascular Disease and +CHF + dysrhythmias Atrial Fibrillation + pacemaker     Neuro/Psych  Headaches,    GI/Hepatic hiatal hernia, GERD-  ,  Endo/Other  Hyperthyroidism   Renal/GU CRFRenal disease     Musculoskeletal   Abdominal   Peds  Hematology   Anesthesia Other Findings   Reproductive/Obstetrics                           Anesthesia Physical Anesthesia Plan  ASA: III  Anesthesia Plan: MAC and General   Post-op Pain Management:    Induction: Intravenous  Airway Management Planned: LMA  Additional Equipment:   Intra-op Plan:   Post-operative Plan: Extubation in OR  Informed Consent: I have reviewed the patients History and Physical, chart, labs and discussed the procedure including the risks, benefits and alternatives for the proposed anesthesia with the patient or authorized representative who has indicated his/her understanding and acceptance.     Plan Discussed with:   Anesthesia Plan Comments:         Anesthesia Quick Evaluation

## 2013-09-20 NOTE — Transfer of Care (Signed)
22Immediate Anesthesia Transfer of Care Note  Patient: [name]  Procedure(s) Performed: Procedure(s): 2ND STAGE RIGHT [place]; ULTRSOUND GUIDED (Right)  Patient Location: PACU  Anesthesia Type:General  Level of Consciousness: awake, alert  and oriented  Airway & Oxygen Therapy: Patient Spontanous Breathing and Patient connected to nasal cannula oxygen  Post-op Assessment: Report given to PACU RN and Post -op Vital signs reviewed and stable  Post vital signs: Reviewed and stable  Complications: No apparent anesthesia complications

## 2013-09-20 NOTE — Anesthesia Postprocedure Evaluation (Signed)
  Anesthesia Post-op Note  Patient: [name]  Procedure(s) Performed: Procedure(s): 2ND STAGE RIGHT [place]; ULTRSOUND GUIDED (Right)  Patient Location: PACU  Anesthesia Type:General  Level of Consciousness: awake, oriented, sedated and patient cooperative  Airway and Oxygen Therapy: Patient Spontanous Breathing  Post-op Pain: mild  Post-op Assessment: Post-op Vital signs reviewed, Patient's Cardiovascular Status Stable, Respiratory Function Stable, Patent Airway, No signs of Nausea or vomiting and Pain level controlled  Post-op Vital Signs: stable  Complications: No apparent anesthesia complications

## 2013-09-20 NOTE — Preoperative (Signed)
Beta Blockers   Reason not to administer Beta Blockers:Not Applicable 

## 2013-09-20 NOTE — H&P (View-Only) (Signed)
Patient name: [name] MRN: [id number] DOB: 09-03-1934 Sex: female     Chief Complaint  Patient presents with  . Re-evaluation    6 wk f/u with duplex s/p 1st stage R BVT    HISTORY OF PRESENT ILLNESS: The patient is back today for followup.  She is status post first stage basilic vein creation on 06/21/2013.  She has no complaints of steal syndrome today.  Past Medical History  Diagnosis Date  . Cardiac pacemaker in situ   . Cellulitis and abscess of unspecified site     a. h/o MSRA cellulitis of abdominal wall.  Marland Kitchen CAD (coronary artery disease)     a. DES to OM 01/2005. b. Rotational atherotomy/DES to prox LAD 02/2008. c. Cath 07/2013: stable, for med rx.  . Edema   . Chronic renal insufficiency     Cr ~2.3.  . Warfarin anticoagulation     a. Followed by PCP.  Marland Kitchen GI bleed     a. EGD 01/2007: antral ulcers related to NSAID/aspirin use.  . Polycystic kidney disease     a. Per remote E-Chart records.  . Peripheral vascular disease   . Hyperthyroidism     a. Hx of intolerance to PTU.  Marland Kitchen Blood transfusion     no abnormal reaction  . H/O hiatal hernia   . Umbilical hernia     unrepaired (11/24/11)  . Arthritis   . Valvular heart disease     Echo 11/2011: mild-mod MR.  Marland Kitchen Hypertension     takes Amlodipine,Hydralazine,and Imdur daily  . Hyperlipidemia     takes Lovastatin daily  . Anemia, unspecified     takes an Iron Pill daily  . SSS (sick sinus syndrome)     a. H/o AV node ablation 2008 secondary to difficult to control afib/flutter, with subsequent pacer.  . Atrial fibrillation with rapid ventricular response     a. H/o amiodarone thyroiditis. b. s/p AV node ablation 2008 with implantation of PPM 01/2007, upgraded to CRT-P 06/2007.;takes Coumadin daily  . GERD (gastroesophageal reflux disease)     takes Protonix daily  . Chronic diastolic CHF (congestive heart failure)     takes Lasix daily and Spironolactone   . Myocardial infarction 1996  . Pacemaker    [employer]tronic  . Migraines     last migraine has been a while but headaches dail  . Chronic back pain     deteriorating disc  . History of gout   . Bruises easily   . Hemorrhoids   . History of kidney stones   . Depression     takes Xanax daily  . Insomnia     takes Xanax if needed    Past Surgical History  Procedure Laterality Date  . Esophagogastroduodenoscopy    . Tonsillectomy  ~ 1960  . Cardiac catheterization  2006; 2008; 2009; 01/13/2011  . Back surgery    . Laminectomy and microdiscectomy lumbar spine  08/2003  . Cardioversion  10/2004; 02/2007    /E-chart  . Cholecystectomy    . Appendectomy  1954  . Vaginal hysterectomy  ?1981  . Av node ablation  02/2007    /E-chart  . Insert / replace / remove pacemaker  01/2007    initial placement  . Insert / replace / remove pacemaker  06/2007    upgraded/E-chart  . Av fistula placement, brachiocephalic  02/622    right; "didn't work; my veins were too small"  . Joint replacement    .  Total knee arthroplasty  01/2009    left  . Bladder and bowel  1970's    "tacked; damaged my intestines & had to remove some; done @ [facility]  . Coronary angioplasty  2    percutanous transluminal   . Bilateral cataract surgery    . Bascilic vein transposition Right 06/21/2013    Procedure: BASCILIC VEIN TRANSPOSITION 1ST STAGE;  Surgeon: Serafina Mitchell, MD;  Location: [facility] OR;  Service: Vascular;  Laterality: Right;  . Esophagogastroduodenoscopy N/A 07/19/2013    Procedure: ESOPHAGOGASTRODUODENOSCOPY (EGD);  Surgeon: Lafayette Dragon, MD;  Location: [facility] ENDOSCOPY;  Service: Endoscopy;  Laterality: N/A;  . Balloon dilation N/A 07/19/2013    Procedure: BALLOON DILATION;  Surgeon: Lafayette Dragon, MD;  Location: [facility] ENDOSCOPY;  Service: Endoscopy;  Laterality: N/A;  . Esophagogastroduodenoscopy N/A 07/20/2013    Procedure: ESOPHAGOGASTRODUODENOSCOPY (EGD);  Surgeon: Lafayette Dragon, MD;  Location: [facility] ENDOSCOPY;  Service: Endoscopy;  Laterality: N/A;  with  removal of polyp in pylorus    History   Social History  . Marital Status: Widowed    Spouse Name: N/A    Number of Children: N/A  . Years of Education: N/A   Occupational History  . Not on file.   Social History Main Topics  . Smoking status: Former Smoker -- 1.00 packs/day for 50 years    Types: Cigarettes    Quit date: 07/13/1995  . Smokeless tobacco: Never Used     Comment: quit smoking around 1996  . Alcohol Use: No  . Drug Use: No  . Sexual Activity: No   Other Topics Concern  . Not on file   Social History Narrative   Lives in [place].    Family History  Problem Relation Age of Onset  . Colon cancer Neg Hx   . Thyroid disease Neg Hx   . Stroke Brother   . Hypertension Brother   . Kidney disease Brother   . Other Mother     Sepsis and perforated colon  . Other Father     Motor vehicle accident    Allergies as of 08/26/2013 - Review Complete 08/26/2013  Allergen Reaction Noted  . Amiodarone Hives   . Propylthiouracil Rash 11/23/2011    Current Outpatient Prescriptions on File Prior to Visit  Medication Sig Dispense Refill  . ALPRAZolam (XANAX) 0.5 MG tablet Take 0.5 mg by mouth 3 (three) times daily as needed.       Marland Kitchen aspirin EC 81 MG tablet Take 81 mg by mouth daily.      . ferrous sulfate 325 (65 FE) MG tablet Take 325 mg by mouth daily with breakfast.       . furosemide (LASIX) 40 MG tablet Take 2 tablets (80 mg total) by mouth daily.  30 tablet  11  . hydrALAZINE (APRESOLINE) 25 MG tablet TAKE 1 TABLET BY MOUTH 3 TIMES A DAY  90 tablet  3  . isosorbide mononitrate (IMDUR) 60 MG 24 hr tablet Take 60 mg by mouth 2 (two) times daily.      Marland Kitchen lovastatin (MEVACOR) 10 MG tablet Take 10 mg by mouth at bedtime.       . magnesium oxide (MAG-OX) 400 MG tablet Take 400 mg by mouth at bedtime.       . metolazone (ZAROXOLYN) 2.5 MG tablet TAKE  1  TAB  AS NEEDED  WITH    A  3 OR MORE  POUND  WEIGHT  GAIN IN  24 H OUR  PERIOD  30 tablet  6  . metoprolol tartrate  (LOPRESSOR) 25 MG tablet Take 3 tablets (75 mg total) by mouth 3 (three) times daily.  256 tablet  3  . nitroGLYCERIN (NITROSTAT) 0.4 MG SL tablet Place 0.4 mg under the tongue every 5 (five) minutes as needed for chest pain.      Marland Kitchen oxyCODONE-acetaminophen (PERCOCET/ROXICET) 5-325 MG per tablet Take 1 tablet by mouth 3 (three) times daily as needed for moderate pain. For pain  30 tablet  0  . pantoprazole (PROTONIX) 40 MG tablet Take 40 mg by mouth 2 (two) times daily.       . paricalcitol (ZEMPLAR) 1 MCG capsule Take 1 mcg by mouth at bedtime.      Marland Kitchen tetrahydrozoline 0.05 % ophthalmic solution Place 1 drop into both eyes 2 (two) times daily as needed (reddness).      . warfarin (COUMADIN) 2 MG tablet Pt takes  1 tablet (2mg ) on Tuesday, Wednesday, Thursday, Saturday, Sunday, and 1.5 tablets (3mg ) on Monday and Friday.      . [DISCONTINUED] FLUoxetine (PROZAC) 20 MG capsule Take 20 mg by mouth daily.         No current facility-administered medications on file prior to visit.     REVIEW OF SYSTEMS: No changes from prior visit  PHYSICAL EXAMINATION:   Vital signs are BP 149/74  Pulse 66  Ht 5\' 7"  (1.702 m)  Wt 201 lb (91.173 kg)  BMI 31.47 kg/m2  SpO2 98% General: The patient appears their stated age. HEENT:  No gross abnormalities Pulmonary:  Non labored breathing Musculoskeletal: There are no major deformities. Neurologic: No focal weakness or paresthesias are detected, Skin: There are no ulcer or rashes noted. Psychiatric: The patient has normal affect. Cardiovascular: Palpable thrill within right basilic vein fistula   Diagnostic Studies I have reviewed her ultrasound.  This shows excellent vein diameter.   Assessment: status post first stage basilic vein transposition Plan: The patient will be scheduled for a second stage right basilic vein transposition on Friday, February 6.  The risks and benefits of the procedure were discussed with the patient and she wishes to  proceed.  I did utilize a side branch off the main basilic vein.  She is on Coumadin.  This will be stopped 5 days prior to her procedure.  Eldridge Abrahams, M.D. [facility] Office: [phone] Pager:  [phone]

## 2013-09-20 NOTE — Discharge Instructions (Signed)
° ° °  09/20/2013 [name] [id number] 09-20-1934  Surgeon(s): Serafina Mitchell, MD  Procedure(s): 2ND STAGE RIGHT BASCILIC VEIN TRANSPOSITION; ULTRSOUND GUIDED  x Do not stick graft for 12 weeks     What to eat:  For your first meals, you should eat lightly; only small meals initially.  If you do not have nausea, you may eat larger meals.  Avoid spicy, greasy and heavy food.    General Anesthesia, Adult, Care After  Refer to this sheet in the next few weeks. These instructions provide you with information on caring for yourself after your procedure. Your health care provider may also give you more specific instructions. Your treatment has been planned according to current medical practices, but problems sometimes occur. Call your health care provider if you have any problems or questions after your procedure.  WHAT TO EXPECT AFTER THE PROCEDURE  After the procedure, it is typical to experience:  Sleepiness.  Nausea and vomiting. HOME CARE INSTRUCTIONS  For the first 24 hours after general anesthesia:  Have a responsible person with you.  Do not drive a car. If you are alone, do not take public transportation.  Do not drink alcohol.  Do not take medicine that has not been prescribed by your health care provider.  Do not sign important papers or make important decisions.  You may resume a normal diet and activities as directed by your health care provider.  Change bandages (dressings) as directed.  If you have questions or problems that seem related to general anesthesia, call the hospital and ask for the anesthetist or anesthesiologist on call. SEEK MEDICAL CARE IF:  You have nausea and vomiting that continue the day after anesthesia.  You develop a rash. SEEK IMMEDIATE MEDICAL CARE IF:  You have difficulty breathing.  You have chest pain.  You have any allergic problems. Document Released: 11/07/2000 Document Revised: 04/03/2013 Document Reviewed: 02/14/2013  [facility]  Patient Information 2014 [place], [place].

## 2013-09-20 NOTE — Progress Notes (Signed)
Dr Tamala Julian aware of o2 sats in pacu.  Instructed to give pt more recovery time, and cont plan to d/c home

## 2013-09-20 NOTE — Anesthesia Procedure Notes (Signed)
Procedure Name: LMA Insertion Date/Time: 09/20/2013 11:35 AM Performed by: Eligha Bridegroom Pre-anesthesia Checklist: Emergency Drugs available, Patient identified, Patient being monitored, Timeout performed and Suction available Patient Re-evaluated:Patient Re-evaluated prior to inductionOxygen Delivery Method: Circle system utilized Preoxygenation: Pre-oxygenation with 100% oxygen Intubation Type: IV induction LMA: LMA with gastric port inserted LMA Size: 4.0 Number of attempts: 1 Placement Confirmation: positive ETCO2 and breath sounds checked- equal and bilateral Tube secured with: Tape Dental Injury: Teeth and Oropharynx as per pre-operative assessment

## 2013-09-23 ENCOUNTER — Telehealth: Payer: Self-pay | Admitting: *Deleted

## 2013-09-23 ENCOUNTER — Telehealth: Payer: Self-pay | Admitting: Surgery

## 2013-09-23 ENCOUNTER — Other Ambulatory Visit: Payer: Self-pay | Admitting: *Deleted

## 2013-09-23 ENCOUNTER — Other Ambulatory Visit: Payer: Medicare Other

## 2013-09-23 ENCOUNTER — Encounter (HOSPITAL_COMMUNITY): Payer: Self-pay | Admitting: Surgery

## 2013-09-23 DIAGNOSIS — N186 End stage renal disease: Secondary | ICD-10-CM

## 2013-09-23 DIAGNOSIS — Z9889 Other specified postprocedural states: Principal | ICD-10-CM

## 2013-09-23 DIAGNOSIS — Z4931 Encounter for adequacy testing for hemodialysis: Secondary | ICD-10-CM

## 2013-09-23 DIAGNOSIS — R112 Nausea with vomiting, unspecified: Secondary | ICD-10-CM

## 2013-09-23 MED ORDER — ONDANSETRON HCL 4 MG PO TABS: 4.0000 mg | ORAL_TABLET | ORAL | Status: DC | PRN

## 2013-09-23 NOTE — Telephone Encounter (Addendum)
Message copied by Gena Fray on Mon Sep 23, 2013  3:41 PM ------      Message from: Denman George      Created: Fri Sep 20, 2013  4:44 PM       FYI: sent to admin pool      ----- Message -----         From: Serafina Mitchell, MD         Sent: 09/20/2013   2:10 PM           To: [facility] Charge Pool            09/20/2013:                  Surgeon:  Eldridge Abrahams      Assistants:  Leontine Locket      Procedure:   Second stage right arm basilic vein transposition            Please schedule the patient for followup in 6 weeks with a duplex ultrasound of her right arm fistula       ------  09/23/13: spoke with patient re appt, dpm

## 2013-09-23 NOTE — Telephone Encounter (Signed)
Mrs. Mostafa called c/o severe post op nausea.  States the first day post op "I vommitted all day long." States from the second day post surgery on she has had severe nausea and has been unable to eat at all.  States she is "sipping Pepsi." Denies fever/chills, diarrhea,exposure to flu or to anyone who is ill.  States her right arm is"not as sore/ is improving."  Denies redness, discharge, or pain from right arm incision.  Discussed pt.'s symptoms with Dr. Trula Slade who ordered Zofran 4 mg. 1 tablet every 4 hours prn nausea. Quantity 30 tablets.  Dr. Trula Slade also wanted to emphasize to pt. To continue to drink sips of fluid.  Dr. Trula Slade states that if Mrs. Woodell is unable to keep down fluids tomorrow, to instruct pt. To see her PCP (medical doctor) as soon as possible.  Called in prescription for Zofran to [facility] in [place], [place] [phone] pharmacist [facility]). Order for Zofran entered into EPIC. Called Mrs. Eddleman with instructions to have someone pick up Zofran for her and to start Zofran as soon as possible as directed and if not able to keep fluids down tomorrow to see her PCP/medical doctor tomorrow.

## 2013-09-26 ENCOUNTER — Inpatient Hospital Stay (HOSPITAL_COMMUNITY)
Admission: EM | Admit: 2013-09-26 | Discharge: 2013-09-28 | DRG: 392 | Disposition: A | Discharge status: Home / Self Care | Payer: Medicare Other | Attending: Internal Medicine | Admitting: Internal Medicine

## 2013-09-26 ENCOUNTER — Encounter (HOSPITAL_COMMUNITY): Payer: Self-pay | Admitting: Emergency Medicine

## 2013-09-26 ENCOUNTER — Emergency Department (HOSPITAL_COMMUNITY): Payer: Medicare Other

## 2013-09-26 DIAGNOSIS — L0390 Cellulitis, unspecified: Secondary | ICD-10-CM

## 2013-09-26 DIAGNOSIS — L0291 Cutaneous abscess, unspecified: Secondary | ICD-10-CM

## 2013-09-26 DIAGNOSIS — K921 Melena: Secondary | ICD-10-CM

## 2013-09-26 DIAGNOSIS — R110 Nausea: Secondary | ICD-10-CM

## 2013-09-26 DIAGNOSIS — I10 Essential (primary) hypertension: Secondary | ICD-10-CM

## 2013-09-26 DIAGNOSIS — G4700 Insomnia, unspecified: Secondary | ICD-10-CM | POA: Diagnosis present

## 2013-09-26 DIAGNOSIS — I4891 Unspecified atrial fibrillation: Secondary | ICD-10-CM

## 2013-09-26 DIAGNOSIS — R195 Other fecal abnormalities: Secondary | ICD-10-CM

## 2013-09-26 DIAGNOSIS — I498 Other specified cardiac arrhythmias: Secondary | ICD-10-CM

## 2013-09-26 DIAGNOSIS — Z950 Presence of cardiac pacemaker: Secondary | ICD-10-CM

## 2013-09-26 DIAGNOSIS — I509 Heart failure, unspecified: Secondary | ICD-10-CM

## 2013-09-26 DIAGNOSIS — I5033 Acute on chronic diastolic (congestive) heart failure: Secondary | ICD-10-CM

## 2013-09-26 DIAGNOSIS — K317 Polyp of stomach and duodenum: Secondary | ICD-10-CM

## 2013-09-26 DIAGNOSIS — N179 Acute kidney failure, unspecified: Secondary | ICD-10-CM

## 2013-09-26 DIAGNOSIS — R630 Anorexia: Secondary | ICD-10-CM | POA: Diagnosis present

## 2013-09-26 DIAGNOSIS — N2581 Secondary hyperparathyroidism of renal origin: Secondary | ICD-10-CM | POA: Diagnosis present

## 2013-09-26 DIAGNOSIS — R079 Chest pain, unspecified: Secondary | ICD-10-CM

## 2013-09-26 DIAGNOSIS — F3289 Other specified depressive episodes: Secondary | ICD-10-CM

## 2013-09-26 DIAGNOSIS — Z66 Do not resuscitate: Secondary | ICD-10-CM | POA: Diagnosis present

## 2013-09-26 DIAGNOSIS — I129 Hypertensive chronic kidney disease with stage 1 through stage 4 chronic kidney disease, or unspecified chronic kidney disease: Secondary | ICD-10-CM | POA: Diagnosis present

## 2013-09-26 DIAGNOSIS — R197 Diarrhea, unspecified: Secondary | ICD-10-CM

## 2013-09-26 DIAGNOSIS — R112 Nausea with vomiting, unspecified: Principal | ICD-10-CM | POA: Diagnosis present

## 2013-09-26 DIAGNOSIS — I252 Old myocardial infarction: Secondary | ICD-10-CM

## 2013-09-26 DIAGNOSIS — Z87891 Personal history of nicotine dependence: Secondary | ICD-10-CM

## 2013-09-26 DIAGNOSIS — R21 Rash and other nonspecific skin eruption: Secondary | ICD-10-CM

## 2013-09-26 DIAGNOSIS — G2581 Restless legs syndrome: Secondary | ICD-10-CM | POA: Diagnosis present

## 2013-09-26 DIAGNOSIS — E785 Hyperlipidemia, unspecified: Secondary | ICD-10-CM

## 2013-09-26 DIAGNOSIS — D649 Anemia, unspecified: Secondary | ICD-10-CM

## 2013-09-26 DIAGNOSIS — L03311 Cellulitis of abdominal wall: Secondary | ICD-10-CM

## 2013-09-26 DIAGNOSIS — Z96659 Presence of unspecified artificial knee joint: Secondary | ICD-10-CM

## 2013-09-26 DIAGNOSIS — E0590 Thyrotoxicosis, unspecified without thyrotoxic crisis or storm: Secondary | ICD-10-CM

## 2013-09-26 DIAGNOSIS — F329 Major depressive disorder, single episode, unspecified: Secondary | ICD-10-CM

## 2013-09-26 DIAGNOSIS — Z9861 Coronary angioplasty status: Secondary | ICD-10-CM

## 2013-09-26 DIAGNOSIS — N259 Disorder resulting from impaired renal tubular function, unspecified: Secondary | ICD-10-CM

## 2013-09-26 DIAGNOSIS — D131 Benign neoplasm of stomach: Secondary | ICD-10-CM

## 2013-09-26 DIAGNOSIS — R1310 Dysphagia, unspecified: Secondary | ICD-10-CM

## 2013-09-26 DIAGNOSIS — N186 End stage renal disease: Secondary | ICD-10-CM

## 2013-09-26 DIAGNOSIS — N189 Chronic kidney disease, unspecified: Secondary | ICD-10-CM

## 2013-09-26 DIAGNOSIS — Z951 Presence of aortocoronary bypass graft: Secondary | ICD-10-CM

## 2013-09-26 DIAGNOSIS — Z7982 Long term (current) use of aspirin: Secondary | ICD-10-CM

## 2013-09-26 DIAGNOSIS — R609 Edema, unspecified: Secondary | ICD-10-CM

## 2013-09-26 DIAGNOSIS — Z8719 Personal history of other diseases of the digestive system: Secondary | ICD-10-CM

## 2013-09-26 DIAGNOSIS — I2510 Atherosclerotic heart disease of native coronary artery without angina pectoris: Secondary | ICD-10-CM

## 2013-09-26 DIAGNOSIS — N184 Chronic kidney disease, stage 4 (severe): Secondary | ICD-10-CM

## 2013-09-26 DIAGNOSIS — K219 Gastro-esophageal reflux disease without esophagitis: Secondary | ICD-10-CM

## 2013-09-26 DIAGNOSIS — I5032 Chronic diastolic (congestive) heart failure: Secondary | ICD-10-CM | POA: Diagnosis present

## 2013-09-26 DIAGNOSIS — E876 Hypokalemia: Secondary | ICD-10-CM

## 2013-09-26 HISTORY — DX: Low back pain: M54.5

## 2013-09-26 HISTORY — DX: Calculus of kidney: N20.0

## 2013-09-26 HISTORY — DX: Other chronic pain: G89.29

## 2013-09-26 HISTORY — DX: Iron deficiency anemia, unspecified: D50.9

## 2013-09-26 HISTORY — DX: Low back pain, unspecified: M54.50

## 2013-09-26 LAB — CBC WITH DIFFERENTIAL/PLATELET
BASOS ABS: 0 10*3/uL (ref 0.0–0.1)
Basophils Relative: 1 % (ref 0–1)
Eosinophils Absolute: 0.1 10*3/uL (ref 0.0–0.7)
Eosinophils Relative: 2 % (ref 0–5)
HCT: 36.6 % (ref 36.0–46.0)
Hemoglobin: 12.4 g/dL (ref 12.0–15.0)
Lymphocytes Relative: 13 % (ref 12–46)
Lymphs Abs: 0.7 10*3/uL (ref 0.7–4.0)
MCH: 29 pg (ref 26.0–34.0)
MCHC: 33.9 g/dL (ref 30.0–36.0)
MCV: 85.7 fL (ref 78.0–100.0)
Monocytes Absolute: 0.4 10*3/uL (ref 0.1–1.0)
Monocytes Relative: 8 % (ref 3–12)
NEUTROS ABS: 4.3 10*3/uL (ref 1.7–7.7)
Neutrophils Relative %: 78 % — ABNORMAL HIGH (ref 43–77)
PLATELETS: 194 10*3/uL (ref 150–400)
RBC: 4.27 MIL/uL (ref 3.87–5.11)
RDW: 15 % (ref 11.5–15.5)
WBC: 5.5 10*3/uL (ref 4.0–10.5)

## 2013-09-26 LAB — URINALYSIS, DIPSTICK ONLY
Bilirubin Urine: NEGATIVE
GLUCOSE, UA: NEGATIVE mg/dL
Hgb urine dipstick: NEGATIVE
KETONES UR: NEGATIVE mg/dL
Nitrite: NEGATIVE
PROTEIN: NEGATIVE mg/dL
Specific Gravity, Urine: 1.013 (ref 1.005–1.030)
Urobilinogen, UA: 0.2 mg/dL (ref 0.0–1.0)
pH: 5.5 (ref 5.0–8.0)

## 2013-09-26 LAB — COMPREHENSIVE METABOLIC PANEL
ALBUMIN: 3.5 g/dL (ref 3.5–5.2)
ALT: 28 U/L (ref 0–35)
AST: 30 U/L (ref 0–37)
Alkaline Phosphatase: 64 U/L (ref 39–117)
BUN: 67 mg/dL — ABNORMAL HIGH (ref 6–23)
CALCIUM: 9.2 mg/dL (ref 8.4–10.5)
CHLORIDE: 99 meq/L (ref 96–112)
CO2: 22 meq/L (ref 19–32)
Creatinine, Ser: 3.68 mg/dL — ABNORMAL HIGH (ref 0.50–1.10)
GFR calc Af Amer: 13 mL/min — ABNORMAL LOW (ref >90)
GFR, EST NON AFRICAN AMERICAN: 11 mL/min — AB (ref >90)
Glucose, Bld: 131 mg/dL — ABNORMAL HIGH (ref 70–99)
Potassium: 4.3 mEq/L (ref 3.7–5.3)
SODIUM: 139 meq/L (ref 137–147)
Total Bilirubin: 0.9 mg/dL (ref 0.3–1.2)
Total Protein: 7.4 g/dL (ref 6.0–8.3)

## 2013-09-26 LAB — CK: Total CK: 33 U/L (ref 7–177)

## 2013-09-26 LAB — PROTIME-INR
INR: 1.54 — ABNORMAL HIGH (ref 0.00–1.49)
Prothrombin Time: 18.1 seconds — ABNORMAL HIGH (ref 11.6–15.2)

## 2013-09-26 LAB — TROPONIN I: Troponin I: <0.3 ng/mL (ref <0.30)

## 2013-09-26 LAB — LIPASE, BLOOD: Lipase: 41 U/L (ref 11–59)

## 2013-09-26 MED ORDER — FERROUS SULFATE 325 (65 FE) MG PO TABS
325.0000 mg | ORAL_TABLET | Freq: Every day | ORAL | Status: DC
  Filled 2013-09-26: qty 1

## 2013-09-26 MED ORDER — WARFARIN SODIUM 5 MG PO TABS
5.0000 mg | ORAL_TABLET | Freq: Once | ORAL | Status: AC
  Administered 2013-09-26: 5 mg via ORAL
  Filled 2013-09-26: qty 1

## 2013-09-26 MED ORDER — METOPROLOL TARTRATE 25 MG PO TABS
25.0000 mg | ORAL_TABLET | Freq: Two times a day (BID) | ORAL | Status: DC
  Administered 2013-09-26 – 2013-09-28 (×4): 25 mg via ORAL
  Filled 2013-09-26 (×5): qty 1

## 2013-09-26 MED ORDER — ONDANSETRON HCL 4 MG/2ML IJ SOLN
4.0000 mg | Freq: Once | INTRAMUSCULAR | Status: AC
  Administered 2013-09-26: 4 mg via INTRAVENOUS
  Filled 2013-09-26: qty 2

## 2013-09-26 MED ORDER — ISOSORBIDE MONONITRATE ER 60 MG PO TB24
60.0000 mg | ORAL_TABLET | Freq: Two times a day (BID) | ORAL | Status: DC
  Administered 2013-09-26 – 2013-09-28 (×4): 60 mg via ORAL
  Filled 2013-09-26 (×5): qty 1

## 2013-09-26 MED ORDER — PANTOPRAZOLE SODIUM 40 MG IV SOLR
40.0000 mg | INTRAVENOUS | Status: DC
  Administered 2013-09-27 – 2013-09-28 (×2): 40 mg via INTRAVENOUS
  Filled 2013-09-26 (×3): qty 40

## 2013-09-26 MED ORDER — METOPROLOL TARTRATE 50 MG PO TABS
75.0000 mg | ORAL_TABLET | Freq: Three times a day (TID) | ORAL | Status: DC
  Filled 2013-09-26: qty 1

## 2013-09-26 MED ORDER — SODIUM CHLORIDE 0.9 % IV SOLN
1020.0000 mg | Freq: Once | INTRAVENOUS | Status: AC
  Administered 2013-09-26: 1020 mg via INTRAVENOUS
  Filled 2013-09-26: qty 34

## 2013-09-26 MED ORDER — HYDRALAZINE HCL 25 MG PO TABS
25.0000 mg | ORAL_TABLET | Freq: Three times a day (TID) | ORAL | Status: DC
  Filled 2013-09-26: qty 1

## 2013-09-26 MED ORDER — WARFARIN - PHARMACIST DOSING INPATIENT: Freq: Every day | Status: DC

## 2013-09-26 MED ORDER — CLONAZEPAM 0.5 MG PO TABS: 0.5000 mg | ORAL_TABLET | Freq: Every evening | ORAL | Status: DC | PRN

## 2013-09-26 MED ORDER — PANTOPRAZOLE SODIUM 40 MG PO TBEC: 40.0000 mg | DELAYED_RELEASE_TABLET | Freq: Two times a day (BID) | ORAL | Status: DC

## 2013-09-26 MED ORDER — ONDANSETRON HCL 4 MG/2ML IJ SOLN: 4.0000 mg | Freq: Four times a day (QID) | INTRAMUSCULAR | Status: DC | PRN

## 2013-09-26 MED ORDER — SODIUM CHLORIDE 0.9 % IV BOLUS (SEPSIS): 500.0000 mL | Freq: Once | INTRAVENOUS | Status: DC

## 2013-09-26 MED ORDER — OXYCODONE-ACETAMINOPHEN 5-325 MG PO TABS
1.0000 | ORAL_TABLET | Freq: Three times a day (TID) | ORAL | Status: DC | PRN
  Administered 2013-09-26 – 2013-09-27 (×2): 1 via ORAL
  Filled 2013-09-26 (×2): qty 1

## 2013-09-26 MED ORDER — ONDANSETRON HCL 4 MG/2ML IJ SOLN
4.0000 mg | Freq: Four times a day (QID) | INTRAMUSCULAR | Status: DC | PRN
  Administered 2013-09-27: 4 mg via INTRAVENOUS
  Filled 2013-09-26 (×2): qty 2

## 2013-09-26 MED ORDER — SODIUM CHLORIDE 0.9 % IV SOLN: INTRAVENOUS | Status: DC

## 2013-09-26 MED ORDER — ALPRAZOLAM 0.5 MG PO TABS: 0.5000 mg | ORAL_TABLET | Freq: Three times a day (TID) | ORAL | Status: DC | PRN

## 2013-09-26 MED ORDER — SODIUM CHLORIDE 0.9 % IV SOLN
INTRAVENOUS | Status: DC
  Administered 2013-09-26 – 2013-09-27 (×2): via INTRAVENOUS

## 2013-09-26 MED ORDER — SIMVASTATIN 20 MG PO TABS
20.0000 mg | ORAL_TABLET | Freq: Every day | ORAL | Status: DC
  Administered 2013-09-27: 20 mg via ORAL
  Filled 2013-09-26 (×2): qty 1

## 2013-09-26 MED ORDER — ENOXAPARIN SODIUM 30 MG/0.3ML ~~LOC~~ SOLN
30.0000 mg | SUBCUTANEOUS | Status: DC
  Administered 2013-09-27: 30 mg via SUBCUTANEOUS
  Filled 2013-09-26 (×3): qty 0.3

## 2013-09-26 MED ORDER — ASPIRIN EC 81 MG PO TBEC
81.0000 mg | DELAYED_RELEASE_TABLET | Freq: Every day | ORAL | Status: DC
  Administered 2013-09-27 – 2013-09-28 (×2): 81 mg via ORAL
  Filled 2013-09-26 (×2): qty 1

## 2013-09-26 MED ORDER — MAGNESIUM OXIDE 400 MG PO TABS
400.0000 mg | ORAL_TABLET | Freq: Every day | ORAL | Status: DC
  Filled 2013-09-26: qty 1

## 2013-09-26 MED ORDER — PARICALCITOL 1 MCG PO CAPS
1.0000 ug | ORAL_CAPSULE | Freq: Every day | ORAL | Status: DC
  Administered 2013-09-26 – 2013-09-27 (×2): 1 ug via ORAL
  Filled 2013-09-26 (×3): qty 1

## 2013-09-26 NOTE — ED Notes (Signed)
Patient presents to ED via  EMS with complaints of nausea vomiting feeling weak since Friday when she had a fistula to right arm.

## 2013-09-26 NOTE — ED Provider Notes (Signed)
MSE was initiated and I personally evaluated the patient and placed orders (if any) at  2:45 PM on September 26, 2013.  The patient appears stable so that the remainder of the MSE may be completed by another provider.  Elmer Sow, MD 09/26/13 [phone]

## 2013-09-26 NOTE — Progress Notes (Signed)
09/26/13 1845 nsg To room 6E per stretcher  accompanied by NT and son alert and oriented patient. Oriented to unit set-up. Call light within reach. Hematoma noted on right forearm fistula + bruit, faint thrill  Fistula was just placed last Friday. Reported to incoming RN. Son in room.

## 2013-09-26 NOTE — ED Provider Notes (Signed)
CSN: [id number]     Arrival date & time 09/26/13  1412 History   None    Chief Complaint  Patient presents with  . Nausea    HPI Comments: 79 yo F hx of CAD s/p stenting X 2, CHF, paroxysmal atrial fibrillation, SSS s/p pacemaker placement, gastric ulcer, gastric polyps s/p polypectomy, CKD s/p 2nd stage brachial vein transposition surgery 09/23/13 presents with CC of nausea.  Pt states symptoms began on Friday, shortly after her surgery.  Pt thought this was normal post-op nausea, vomiting.  He contacted her surgeon who prescribed Zofran for symptomatic reflief, however this has not helped.   Denies fever, chills, CP, dyspnea, abdominal pain, diarrhea, dysuria, vaginal symptoms, myalgias, rash, or any other symptoms.  Pt has had normal, nonbloody bowel movements, including last one today.  Other than Zofran no other meds taken for symptoms.  Pt has attempted eating, drinking but vomits shortly after.  Pt has had previous occurrence, and believes symptoms are similar to ones attributed to gastric ulcer in December 2014.    The history is provided by the patient and a relative. No language interpreter was used.    Past Medical History  Diagnosis Date  . Cardiac pacemaker in situ   . Cellulitis and abscess of unspecified site     a. h/o MSRA cellulitis of abdominal wall.  Marland Kitchen CAD (coronary artery disease)     a. DES to OM 01/2005. b. Rotational atherotomy/DES to prox LAD 02/2008. c. Cath 07/2013: stable, for med rx.  . Edema   . Chronic renal insufficiency     Cr ~2.3.  . Warfarin anticoagulation     a. Followed by PCP.  Marland Kitchen GI bleed     a. EGD 01/2007: antral ulcers related to NSAID/aspirin use.  . Polycystic kidney disease     a. Per remote E-Chart records.  . Peripheral vascular disease   . Hyperthyroidism     a. Hx of intolerance to PTU.  Marland Kitchen Blood transfusion     no abnormal reaction  . H/O hiatal hernia   . Umbilical hernia     unrepaired (11/24/11)  . Arthritis   . Valvular heart  disease     Echo 11/2011: mild-mod MR.  Marland Kitchen Hypertension     takes Amlodipine,Hydralazine,and Imdur daily  . Hyperlipidemia     takes Lovastatin daily  . Anemia, unspecified     takes an Iron Pill daily  . SSS (sick sinus syndrome)     a. H/o AV node ablation 2008 secondary to difficult to control afib/flutter, with subsequent pacer.  . Atrial fibrillation with rapid ventricular response     a. H/o amiodarone thyroiditis. b. s/p AV node ablation 2008 with implantation of PPM 01/2007, upgraded to CRT-P 06/2007.;takes Coumadin daily  . GERD (gastroesophageal reflux disease)     takes Protonix daily  . Chronic diastolic CHF (congestive heart failure)     takes Lasix daily and Spironolactone   . Myocardial infarction 1996  . Pacemaker     Medtronic  . Migraines     last migraine has been a while but headaches dail  . Chronic back pain     deteriorating disc  . History of gout   . Bruises easily   . Hemorrhoids   . History of kidney stones   . Depression     takes Xanax daily  . Insomnia     takes Xanax if needed   Past Surgical History  Procedure Laterality Date  .  Esophagogastroduodenoscopy    . Tonsillectomy  ~ 1960  . Cardiac catheterization  2006; 2008; 2009; 01/13/2011  . Back surgery    . Laminectomy and microdiscectomy lumbar spine  08/2003  . Cardioversion  10/2004; 02/2007    /E-chart  . Cholecystectomy    . Appendectomy  1954  . Vaginal hysterectomy  ?1981  . Av node ablation  02/2007    /E-chart  . Insert / replace / remove pacemaker  01/2007    initial placement  . Insert / replace / remove pacemaker  06/2007    upgraded/E-chart  . Av fistula placement, brachiocephalic  [id number]    right; "didn't work; my veins were too small"  . Joint replacement    . Total knee arthroplasty  01/2009    left  . Bladder and bowel  1970's    "tacked; damaged my intestines & had to remove some; done @ [facility]  . Coronary angioplasty  2    percutanous transluminal   . Bilateral  cataract surgery    . Bascilic vein transposition Right 06/21/2013    Procedure: BASCILIC VEIN TRANSPOSITION 1ST STAGE;  Surgeon: Serafina Mitchell, MD;  Location: [facility] OR;  Service: Vascular;  Laterality: Right;  . Esophagogastroduodenoscopy N/A 07/19/2013    Procedure: ESOPHAGOGASTRODUODENOSCOPY (EGD);  Surgeon: Lafayette Dragon, MD;  Location: [facility] ENDOSCOPY;  Service: Endoscopy;  Laterality: N/A;  . Balloon dilation N/A 07/19/2013    Procedure: BALLOON DILATION;  Surgeon: Lafayette Dragon, MD;  Location: [facility] ENDOSCOPY;  Service: Endoscopy;  Laterality: N/A;  . Esophagogastroduodenoscopy N/A 07/20/2013    Procedure: ESOPHAGOGASTRODUODENOSCOPY (EGD);  Surgeon: Lafayette Dragon, MD;  Location: [facility] ENDOSCOPY;  Service: Endoscopy;  Laterality: N/A;  with removal of polyp in pylorus  . Bascilic vein transposition Right 09/20/2013    Procedure: 2ND STAGE RIGHT BASCILIC VEIN TRANSPOSITION; ULTRSOUND GUIDED;  Surgeon: Serafina Mitchell, MD;  Location: [facility] OR;  Service: Vascular;  Laterality: Right;   Family History  Problem Relation Age of Onset  . Colon cancer Neg Hx   . Thyroid disease Neg Hx   . Stroke Brother   . Hypertension Brother   . Kidney disease Brother   . Other Mother     Sepsis and perforated colon  . Other Father     Motor vehicle accident   History  Substance Use Topics  . Smoking status: Former Smoker -- 1.00 packs/day for 50 years    Types: Cigarettes    Quit date: 07/13/1995  . Smokeless tobacco: Never Used     Comment: quit smoking around 1996  . Alcohol Use: No   OB History   Grav Para Term Preterm Abortions TAB SAB Ect Mult Living                 Review of Systems  Constitutional: Negative for fever and chills.  Respiratory: Negative for cough and shortness of breath.   Cardiovascular: Negative for chest pain, palpitations and leg swelling.  Gastrointestinal: Positive for nausea and vomiting. Negative for abdominal pain, diarrhea, constipation, blood in stool and abdominal  distention.  Genitourinary: Negative for dysuria, vaginal bleeding and vaginal discharge.  Musculoskeletal: Negative for myalgias.  Skin: Negative for rash.  Neurological: Negative for dizziness, weakness, light-headedness, numbness and headaches.  Hematological: Negative for adenopathy. Does not bruise/bleed easily.  All other systems reviewed and are negative.      Allergies  Amiodarone and Propylthiouracil  Home Medications   Current Outpatient Rx  Name  Route  Sig  Dispense  Refill  . ALPRAZolam (XANAX) 0.5 MG tablet   Oral   Take 0.5 mg by mouth 3 (three) times daily as needed for anxiety.          Marland Kitchen aspirin EC 81 MG tablet   Oral   Take 81 mg by mouth daily.         . ferrous sulfate 325 (65 FE) MG tablet   Oral   Take 325 mg by mouth daily with breakfast.          . furosemide (LASIX) 40 MG tablet   Oral   Take 2 tablets (80 mg total) by mouth daily.   30 tablet   11   . hydrALAZINE (APRESOLINE) 25 MG tablet   Oral   Take 25 mg by mouth 3 (three) times daily.         . isosorbide mononitrate (IMDUR) 60 MG 24 hr tablet   Oral   Take 60 mg by mouth 2 (two) times daily.         Marland Kitchen lovastatin (MEVACOR) 10 MG tablet   Oral   Take 10 mg by mouth at bedtime.          . magnesium oxide (MAG-OX) 400 MG tablet   Oral   Take 400 mg by mouth at bedtime.          . metolazone (ZAROXOLYN) 2.5 MG tablet   Oral   Take 2.5 mg by mouth daily as needed (take only with a 3-pound or more weight gain in 24 hour period).         . metoprolol tartrate (LOPRESSOR) 25 MG tablet   Oral   Take 3 tablets (75 mg total) by mouth 3 (three) times daily.   256 tablet   3     THIS IS AN INCREASE   . nitroGLYCERIN (NITROSTAT) 0.4 MG SL tablet   Sublingual   Place 0.4 mg under the tongue every 5 (five) minutes as needed for chest pain.         Marland Kitchen ondansetron (ZOFRAN) 4 MG tablet   Oral   Take 1 tablet (4 mg total) by mouth every 4 (four) hours as needed for  nausea or vomiting.   30 tablet   1   . oxyCODONE-acetaminophen (PERCOCET/ROXICET) 5-325 MG per tablet   Oral   Take 1 tablet by mouth 3 (three) times daily as needed for moderate pain. For pain   30 tablet   0   . pantoprazole (PROTONIX) 40 MG tablet   Oral   Take 40 mg by mouth 2 (two) times daily.          . paricalcitol (ZEMPLAR) 1 MCG capsule   Oral   Take 1 mcg by mouth at bedtime.         . potassium chloride SA (K-DUR,KLOR-CON) 20 MEQ tablet   Oral   Take 1 tablet (20 mEq total) by mouth daily.   30 tablet   6   . warfarin (COUMADIN) 2 MG tablet   Oral   Take 2-3 mg by mouth daily. Takes 1 tablets (3mg ) on Mondays and Fridays.  Takes 1 tablet (2mg ) on all remaining days.          BP 125/69  Pulse 66  Temp(Src) 97.5 F (36.4 C) (Oral)  Resp 25  Ht 5\' 7"  (1.702 m)  Wt 195 lb (88.451 kg)  BMI 30.53 kg/m2  SpO2 98% Physical Exam  Nursing note and vitals reviewed. Constitutional: She  is oriented to person, place, and time. She appears well-developed and well-nourished.  HENT:  Head: Normocephalic and atraumatic.  Dry mucous membranes.  Eyes: Conjunctivae and EOM are normal. Pupils are equal, round, and reactive to light.  Neck: Normal range of motion. Neck supple.  Cardiovascular: Normal rate, regular rhythm, normal heart sounds and intact distal pulses.  Exam reveals no gallop and no friction rub.   No murmur heard. Pulmonary/Chest: Effort normal and breath sounds normal. No respiratory distress. She has no wheezes. She has no rales. She exhibits no tenderness.  Abdominal: Soft. Bowel sounds are normal. She exhibits no distension and no mass. There is no tenderness. There is no rebound and no guarding.  No TTP, no distention.  Musculoskeletal: Normal range of motion.  Neurological: She is alert and oriented to person, place, and time.  Skin: Skin is warm and dry.    ED Course  Procedures (including critical care time) Labs Review Labs Reviewed   CBC WITH DIFFERENTIAL - Abnormal; Notable for the following:    Neutrophils Relative % 78 (*)    All other components within normal limits  COMPREHENSIVE METABOLIC PANEL - Abnormal; Notable for the following:    Glucose, Bld 131 (*)    BUN 67 (*)    Creatinine, Ser 3.68 (*)    GFR calc non Af Amer 11 (*)    GFR calc Af Amer 13 (*)    All other components within normal limits  URINALYSIS, DIPSTICK ONLY - Abnormal; Notable for the following:    Leukocytes, UA SMALL (*)    All other components within normal limits  PROTIME-INR - Abnormal; Notable for the following:    Prothrombin Time 18.1 (*)    INR 1.54 (*)    All other components within normal limits  TROPONIN I  LIPASE, BLOOD  CK  URINALYSIS, ROUTINE W REFLEX MICROSCOPIC  CBC  BASIC METABOLIC PANEL  PROTIME-INR   Imaging Review Dg Chest Port 1 View  09/26/2013   CLINICAL DATA:  Weakness, nausea  EXAM: PORTABLE CHEST - 1 VIEW  COMPARISON:  07/18/2013  FINDINGS: Cardiomegaly again noted. Three leads cardiac pacemaker is unchanged in position. No acute infiltrate or pleural effusion. No pulmonary edema. Again noted chronic elevation of the right hemidiaphragm  IMPRESSION: Cardiomegaly. Again noted chronic elevation of the right hemidiaphragm. No acute infiltrate or pulmonary edema.   Electronically Signed   By: Lahoma Crocker M.D.   On: 09/26/2013 15:16    EKG Interpretation   None       MDM   Final diagnoses:  None    79 yo F hx of CAD s/p stenting X 2, CHF, paroxysmal atrial fibrillation, SSS s/p pacemaker placement, gastric ulcer, gastric polyps s/p polypectomy, CKD s/p 2nd stage brachial vein transposition surgery 09/23/13 presents with CC of nausea.  Filed Vitals:   09/26/13 2027  BP: 134/61  Pulse: 62  Temp: 97.8 F (36.6 C)  Resp: 18   Physical exam as above.  VS WNL.  Dry mucous membranes.  Abdominal exam benign, no TTP, or distention, and pt has had normal BM, thus unlikely SBO.  EKG junctional rhythm, IVCD,  rate 66, QRS 116, QT/QTc 448/469.  CXR demonstrates cardiomegaly, chronic elevation of right hemidiaphragm, without signs of infiltrate or pulmonary edema.  Labs demonstrate elevated BUN/Cr 67/3.68 from baseline (40s/2.9-3.0)  CBC WNL.  Lipase WNL.  Troponin < 0.30.  UA negative for nitrites, small leuks.  INR 1.54.    Pt with evidence of dehdration  on exam, uremia with AKI on CKD.  Pt given 500 cc NS bolus, Zofran with some improvement in symptoms.  Medicine consulted, and pt to be admitted for further evaluation and workup of uremia, AKI on CKD.  Pt understands and agrees with plan.  Sinda Du, MD    Sinda Du, MD 09/27/13 [phone]

## 2013-09-26 NOTE — Progress Notes (Signed)
ANTICOAGULATION CONSULT NOTE - Initial Consult  Pharmacy Consult for coumadin Indication: atrial fibrillation  Allergies  Allergen Reactions  . Amiodarone Hives  . Propylthiouracil Rash    Patient Measurements: Height: 5\' 7"  (170.2 cm) Weight: 203 lb 4.2 oz (92.2 kg) IBW/kg (Calculated) : 61.6 Heparin Dosing Weight:   Vital Signs: Temp: 97.8 F (36.6 C) (02/12 1830) Temp src: Oral (02/12 1420) BP: 135/55 mmHg (02/12 1830) Pulse Rate: 59 (02/12 1830)  Labs:  Recent Labs  09/26/13 1435  HGB 12.4  HCT 36.6  PLT 194  LABPROT 18.1*  INR 1.54*  CREATININE 3.68*  TROPONINI <0.30    Estimated Creatinine Clearance: 15.2 ml/min (by C-G formula based on Cr of 3.68).   Medical History: Past Medical History  Diagnosis Date  . Cardiac pacemaker in situ   . Cellulitis and abscess of unspecified site     a. h/o MSRA cellulitis of abdominal wall.  Marland Kitchen CAD (coronary artery disease)     a. DES to OM 01/2005. b. Rotational atherotomy/DES to prox LAD 02/2008. c. Cath 07/2013: stable, for med rx.  . Edema   . Chronic renal insufficiency     Cr ~2.3.  . Warfarin anticoagulation     a. Followed by PCP.  Marland Kitchen GI bleed     a. EGD 01/2007: antral ulcers related to NSAID/aspirin use.  . Polycystic kidney disease     a. Per remote E-Chart records.  . Peripheral vascular disease   . Hyperthyroidism     a. Hx of intolerance to PTU.  Marland Kitchen Blood transfusion     no abnormal reaction  . H/O hiatal hernia   . Umbilical hernia     unrepaired (11/24/11)  . Arthritis   . Valvular heart disease     Echo 11/2011: mild-mod MR.  Marland Kitchen Hypertension     takes Amlodipine,Hydralazine,and Imdur daily  . Hyperlipidemia     takes Lovastatin daily  . Anemia, unspecified     takes an Iron Pill daily  . SSS (sick sinus syndrome)     a. H/o AV node ablation 2008 secondary to difficult to control afib/flutter, with subsequent pacer.  . Atrial fibrillation with rapid ventricular response     a. H/o amiodarone  thyroiditis. b. s/p AV node ablation 2008 with implantation of PPM 01/2007, upgraded to CRT-P 06/2007.;takes Coumadin daily  . GERD (gastroesophageal reflux disease)     takes Protonix daily  . Chronic diastolic CHF (congestive heart failure)     takes Lasix daily and Spironolactone   . Myocardial infarction 1996  . Pacemaker     Medtronic  . Migraines     last migraine has been a while but headaches dail  . Chronic back pain     deteriorating disc  . History of gout   . Bruises easily   . Hemorrhoids   . History of kidney stones   . Depression     takes Xanax daily  . Insomnia     takes Xanax if needed    Medications:  Scheduled:  . [START ON 09/27/2013] aspirin EC  81 mg Oral Daily  . enoxaparin (LOVENOX) injection  30 mg Subcutaneous Q24H  . ferumoxytol  1,020 mg Intravenous Once  . isosorbide mononitrate  60 mg Oral BID  . metoprolol tartrate  25 mg Oral BID  . [START ON 09/27/2013] pantoprazole (PROTONIX) IV  40 mg Intravenous Q24H  . paricalcitol  1 mcg Oral QHS  . [START ON 09/27/2013] simvastatin  20 mg Oral  q1800  . warfarin  5 mg Oral Once  . [START ON 09/27/2013] Warfarin - Pharmacist Dosing Inpatient   Does not apply q1800    Assessment: 79 yr old female with a PMH that includes CAD, Afib on coumadin), pacemaker, CKD 4, gastric polyp removed in Dec'14. She has had n/v since her brachial vein transposition by Dr. Trula Slade. She is experiencing a worsening of her kidney function. Home dose of coumadin is 3 mg (1.5 tabs) on Mon and Fri and 2 mg (one tab) the other days. Current INR is 1.54.  Goal of Therapy:  INR 2-3 Monitor platelets by anticoagulation protocol: Yes   Plan:  1) Since the INR is subtherapeutic, I will order 5 mg on coumadin.  2) Daily INR with AM labs.  Minta Balsam 09/26/2013,7:29 PM

## 2013-09-26 NOTE — H&P (Addendum)
[facility] History and Physical  [name] AYT:[id number] DOB: 08-15-1935 DOA: 09/26/2013  Referring physician: EDP PCP: Tamsen Roers, MD   Chief Complaint: nausea, vomiting  HPI: [name] is a 79 y.o. female with PMH of CAD, Pafib on coumadin, SSS s/p pacemaker, CKD 4, h/o gastric polyp s/p removal in December. Has had anorexia, nausea, some intermittent vomiting that worsened on Friday. She has had intermittent symptoms off and on for 1-2 month. She had brachial vein transposition on 2/6 by Dr.Brabham, and reports worsening nausea and vomiting since then. Denies any medication changes, no fevers or chills, no cough, congestion, no diarrhea. In ER noted to have worsening of kidney function   Review of Systems:  Constitutional:  No weight loss, night sweats, Fevers, chills, fatigue.  HEENT:  No headaches, Difficulty swallowing,Tooth/dental problems,Sore throat,  No sneezing, itching, ear ache, nasal congestion, post nasal drip,  Cardio-vascular:  No chest pain, Orthopnea, PND, swelling in lower extremities, anasarca, dizziness, palpitations  GI:  No heartburn, indigestion, abdominal pain, nausea, vomiting, diarrhea, change in bowel habits, loss of appetite  Resp:  No shortness of breath with exertion or at rest. No excess mucus, no productive cough, No non-productive cough, No coughing up of blood.No change in color of mucus.No wheezing.No chest wall deformity  Skin:  no rash or lesions.  GU:  no dysuria, change in color of urine, no urgency or frequency. No flank pain.  Musculoskeletal:  No joint pain or swelling. No decreased range of motion. No back pain.  Psych:  No change in mood or affect. No depression or anxiety. No memory loss.   Past Medical History  Diagnosis Date  . Cardiac pacemaker in situ   . Cellulitis and abscess of unspecified site     a. h/o MSRA cellulitis of abdominal wall.  Marland Kitchen CAD (coronary artery disease)     a. DES to OM  01/2005. b. Rotational atherotomy/DES to prox LAD 02/2008. c. Cath 07/2013: stable, for med rx.  . Edema   . Chronic renal insufficiency     Cr ~2.3.  . Warfarin anticoagulation     a. Followed by PCP.  Marland Kitchen GI bleed     a. EGD 01/2007: antral ulcers related to NSAID/aspirin use.  . Polycystic kidney disease     a. Per remote E-Chart records.  . Peripheral vascular disease   . Hyperthyroidism     a. Hx of intolerance to PTU.  Marland Kitchen Blood transfusion     no abnormal reaction  . H/O hiatal hernia   . Umbilical hernia     unrepaired (11/24/11)  . Arthritis   . Valvular heart disease     Echo 11/2011: mild-mod MR.  Marland Kitchen Hypertension     takes Amlodipine,Hydralazine,and Imdur daily  . Hyperlipidemia     takes Lovastatin daily  . Anemia, unspecified     takes an Iron Pill daily  . SSS (sick sinus syndrome)     a. H/o AV node ablation 2008 secondary to difficult to control afib/flutter, with subsequent pacer.  . Atrial fibrillation with rapid ventricular response     a. H/o amiodarone thyroiditis. b. s/p AV node ablation 2008 with implantation of PPM 01/2007, upgraded to CRT-P 06/2007.;takes Coumadin daily  . GERD (gastroesophageal reflux disease)     takes Protonix daily  . Chronic diastolic CHF (congestive heart failure)     takes Lasix daily and Spironolactone   . Myocardial infarction 1996  . Pacemaker     [employer]  .  Migraines     last migraine has been a while but headaches dail  . Chronic back pain     deteriorating disc  . History of gout   . Bruises easily   . Hemorrhoids   . History of kidney stones   . Depression     takes Xanax daily  . Insomnia     takes Xanax if needed   Past Surgical History  Procedure Laterality Date  . Esophagogastroduodenoscopy    . Tonsillectomy  ~ 1960  . Cardiac catheterization  2006; 2008; 2009; 01/13/2011  . Back surgery    . Laminectomy and microdiscectomy lumbar spine  08/2003  . Cardioversion  10/2004; 02/2007    /E-chart  . Cholecystectomy     . Appendectomy  1954  . Vaginal hysterectomy  ?1981  . Av node ablation  02/2007    /E-chart  . Insert / replace / remove pacemaker  01/2007    initial placement  . Insert / replace / remove pacemaker  06/2007    upgraded/E-chart  . Av fistula placement, brachiocephalic  [id number]    right; "didn't work; my veins were too small"  . Joint replacement    . Total knee arthroplasty  01/2009    left  . Bladder and bowel  1970's    "tacked; damaged my intestines & had to remove some; done @ [facility]  . Coronary angioplasty  2    percutanous transluminal   . Bilateral cataract surgery    . Bascilic vein transposition Right 06/21/2013    Procedure: BASCILIC VEIN TRANSPOSITION 1ST STAGE;  Surgeon: Serafina Mitchell, MD;  Location: [facility] OR;  Service: Vascular;  Laterality: Right;  . Esophagogastroduodenoscopy N/A 07/19/2013    Procedure: ESOPHAGOGASTRODUODENOSCOPY (EGD);  Surgeon: Lafayette Dragon, MD;  Location: [facility] ENDOSCOPY;  Service: Endoscopy;  Laterality: N/A;  . Balloon dilation N/A 07/19/2013    Procedure: BALLOON DILATION;  Surgeon: Lafayette Dragon, MD;  Location: [facility] ENDOSCOPY;  Service: Endoscopy;  Laterality: N/A;  . Esophagogastroduodenoscopy N/A 07/20/2013    Procedure: ESOPHAGOGASTRODUODENOSCOPY (EGD);  Surgeon: Lafayette Dragon, MD;  Location: [facility] ENDOSCOPY;  Service: Endoscopy;  Laterality: N/A;  with removal of polyp in pylorus  . Bascilic vein transposition Right 09/20/2013    Procedure: 2ND STAGE RIGHT BASCILIC VEIN TRANSPOSITION; ULTRSOUND GUIDED;  Surgeon: Serafina Mitchell, MD;  Location: [facility] OR;  Service: Vascular;  Laterality: Right;   Social History:  reports that she quit smoking about 18 years ago. Her smoking use included Cigarettes. She has a 50 pack-year smoking history. She has never used smokeless tobacco. She reports that she does not drink alcohol or use illicit drugs.  Allergies  Allergen Reactions  . Amiodarone Hives  . Propylthiouracil Rash    Family History  Problem  Relation Age of Onset  . Colon cancer Neg Hx   . Thyroid disease Neg Hx   . Stroke Brother   . Hypertension Brother   . Kidney disease Brother   . Other Mother     Sepsis and perforated colon  . Other Father     Motor vehicle accident     Prior to Admission medications   Medication Sig Start Date End Date Taking? Authorizing Provider  ALPRAZolam Duanne Moron) 0.5 MG tablet Take 0.5 mg by mouth 3 (three) times daily as needed for anxiety.    Yes Historical Provider, MD  aspirin EC 81 MG tablet Take 81 mg by mouth daily.   Yes Historical Provider, MD  ferrous  sulfate 325 (65 FE) MG tablet Take 325 mg by mouth daily with breakfast.    Yes Historical Provider, MD  furosemide (LASIX) 40 MG tablet Take 2 tablets (80 mg total) by mouth daily. 07/24/13  Yes Josue Hector, MD  hydrALAZINE (APRESOLINE) 25 MG tablet Take 25 mg by mouth 3 (three) times daily.   Yes Historical Provider, MD  isosorbide mononitrate (IMDUR) 60 MG 24 hr tablet Take 60 mg by mouth 2 (two) times daily. 07/01/13  Yes Scott Joylene Draft, PA-C  lovastatin (MEVACOR) 10 MG tablet Take 10 mg by mouth at bedtime.    Yes Historical Provider, MD  magnesium oxide (MAG-OX) 400 MG tablet Take 400 mg by mouth at bedtime.    Yes Historical Provider, MD  metolazone (ZAROXOLYN) 2.5 MG tablet Take 2.5 mg by mouth daily as needed (take only with a 3-pound or more weight gain in 24 hour period).   Yes Historical Provider, MD  metoprolol tartrate (LOPRESSOR) 25 MG tablet Take 3 tablets (75 mg total) by mouth 3 (three) times daily. 07/01/13  Yes Scott T Kathlen Mody, PA-C  ondansetron (ZOFRAN) 4 MG tablet Take 1 tablet (4 mg total) by mouth every 4 (four) hours as needed for nausea or vomiting. 09/23/13  Yes Serafina Mitchell, MD  oxyCODONE-acetaminophen (PERCOCET/ROXICET) 5-325 MG per tablet Take 1 tablet by mouth 3 (three) times daily as needed for moderate pain. For pain 09/20/13  Yes Samantha J Rhyne, PA-C  pantoprazole (PROTONIX) 40 MG tablet Take 40 mg by mouth  2 (two) times daily.    Yes Historical Provider, MD  paricalcitol (ZEMPLAR) 1 MCG capsule Take 1 mcg by mouth at bedtime.   Yes Historical Provider, MD  potassium chloride SA (K-DUR,KLOR-CON) 20 MEQ tablet Take 1 tablet (20 mEq total) by mouth daily. 09/06/13  Yes Burtis Junes, NP  warfarin (COUMADIN) 2 MG tablet Take 2-3 mg by mouth daily at 6 PM. Takes 1 tablets (3mg ) on Mondays and Fridays.  Takes 1 tablet (2mg ) on all remaining days.   Yes Historical Provider, MD  nitroGLYCERIN (NITROSTAT) 0.4 MG SL tablet Place 0.4 mg under the tongue every 5 (five) minutes as needed for chest pain.    Historical Provider, MD   Physical Exam: Filed Vitals:   09/26/13 1630  BP: 107/58  Pulse: 60  Temp:   Resp: 19    BP 107/58  Pulse 60  Temp(Src) 97.5 F (36.4 C) (Oral)  Resp 19  Ht 5\' 7"  (1.702 m)  Wt 88.451 kg (195 lb)  BMI 30.53 kg/m2  SpO2 97%  General:  Appears calm and comfortable, no distress Eyes: PERRL, normal lids, irises & conjunctiva ENT: grossly normal hearing, lips & tongue Neck: no LAD, masses or thyromegaly Cardiovascular: Irregular rate and rhythm Respiratory: CTA bilaterally, no w/r/r. Normal respiratory effort. Abdomen: soft, obese, mild distended, NT, BS present Skin: no rash or induration seen on limited exam Musculoskeletal: trace edema no c/c, scabs in lower legs R arm AVF, small hematoma Psychiatric: grossly normal mood and affect, speech fluent and appropriate Neurologic: grossly non-focal.          Labs on Admission:  Basic Metabolic Panel:  Recent Labs Lab 09/20/13 0752 09/26/13 1435  NA 141 139  K 3.1* 4.3  CL  --  99  CO2  --  22  GLUCOSE 99 131*  BUN  --  67*  CREATININE  --  3.68*  CALCIUM  --  9.2   Liver Function Tests:  Recent  Labs Lab 09/26/13 1435  AST 30  ALT 28  ALKPHOS 64  BILITOT 0.9  PROT 7.4  ALBUMIN 3.5    Recent Labs Lab 09/26/13 1435  LIPASE 41   No results found for this basename: AMMONIA,  in the last 168  hours CBC:  Recent Labs Lab 09/20/13 0752 09/26/13 1435  WBC  --  5.5  NEUTROABS  --  4.3  HGB 11.9* 12.4  HCT 35.0* 36.6  MCV  --  85.7  PLT  --  194   Cardiac Enzymes:  Recent Labs Lab 09/26/13 1435  TROPONINI <0.30    BNP (last 3 results)  Recent Labs  03/27/13 1218 07/09/13 1701 07/18/13 1137  PROBNP 288.0* 412.0* 14148.0*   CBG: No results found for this basename: GLUCAP,  in the last 168 hours  Radiological Exams on Admission: Dg Chest Port 1 View  09/26/2013   CLINICAL DATA:  Weakness, nausea  EXAM: PORTABLE CHEST - 1 VIEW  COMPARISON:  07/18/2013  FINDINGS: Cardiomegaly again noted. Three leads cardiac pacemaker is unchanged in position. No acute infiltrate or pleural effusion. No pulmonary edema. Again noted chronic elevation of the right hemidiaphragm  IMPRESSION: Cardiomegaly. Again noted chronic elevation of the right hemidiaphragm. No acute infiltrate or pulmonary edema.   Electronically Signed   By: Lahoma Crocker M.D.   On: 09/26/2013 15:16    EKG: Independently reviewed.  Assessment/Plan  1. Nausea/VOmiting/anorexia/poor PO intake -i suspect she could have symptoms of uremia -will request Renal eval -Clears, PPI, supportive  Care -check UA -other possibility is whether this is related recent GI issues i.e gastric polyp, though seems unlikely  2. H/o Gastric polyp s/p removal last month -biopsy non dysplastic polyp  3. AKI on CKD 4 -due to N/V/poor Po intake -gentle hydration -Renal eval -suspect she will need HD sooner than originally anticipated   4. Pafib -rate controlled -continue metoprolol, coumadin  5. HTN -continue hydralazine and imdur  DVT proph: on anticoagulation  Code Status: DNR Family Communication: d/w pt and son at bedside Disposition Plan: medsurg  Time spent: 78min  [name] [facility] Pager [phone]

## 2013-09-26 NOTE — Consult Note (Signed)
Reason for Consult:CKD 4/AKI Referring Physician: Dr. Elyse Jarvis [name] is an 79 y.o. female.  HPI: 51yrfemale with hx of PCKD 2 and HTN leading to progressive renal injury.  1st stage RUA BVT about 3 mon ago and 2nd stage on 2/6.  Post op has developed N,V, starting almost immed postop and continuing.  Notified Dr BTrula Sladeand given Zofran. She had doubled up on her Percocet also and cont to do so.  V 1x/d. Only sipping on liquids, cont to take meds including Lasix, spironolactone and Zaroxolyn.  Also on FeSO4.  No D, fevers,or chills.  No cough but has arm pain.  No falls but lightheaded with standing.  Baseline Cr 2.8 12/14 and now 3.7.  Also hx CAD s/p CABG, ^ lipids, Pacer for SSS, hx Afib on Warfarin, anemia, and HPTH.   Constitutional: as above Eyes: negative Ears, nose, mouth, throat, and face: negative Respiratory: negative Cardiovascular: negative Gastrointestinal: as above Genitourinary:negative Integument/breast: bruises easily Hematologic/lymphatic: anemia Musculoskeletal:DJD, esp knees both op in past Neurological: restless legs Allergic/Immunologic: Amio. PTU  Primary Nephrologist Dr. CArty Baumgartner .  Past Medical History  Diagnosis Date  . Cardiac pacemaker in situ   . Cellulitis and abscess of unspecified site     a. h/o MSRA cellulitis of abdominal wall.  .Marland KitchenCAD (coronary artery disease)     a. DES to OM 01/2005. b. Rotational atherotomy/DES to prox LAD 02/2008. c. Cath 07/2013: stable, for med rx.  . Edema   . Chronic renal insufficiency     Cr ~2.3.  . Warfarin anticoagulation     a. Followed by PCP.  .Marland KitchenGI bleed     a. EGD 01/2007: antral ulcers related to NSAID/aspirin use.  . Polycystic kidney disease     a. Per remote E-Chart records.  . Peripheral vascular disease   . Hyperthyroidism     a. Hx of intolerance to PTU.  .Marland KitchenBlood transfusion     no abnormal reaction  . H/O hiatal hernia   . Umbilical hernia     unrepaired (11/24/11)  . Arthritis   .  Valvular heart disease     Echo 11/2011: mild-mod MR.  .Marland KitchenHypertension     takes Amlodipine,Hydralazine,and Imdur daily  . Hyperlipidemia     takes Lovastatin daily  . Anemia, unspecified     takes an Iron Pill daily  . SSS (sick sinus syndrome)     a. H/o AV node ablation 2008 secondary to difficult to control afib/flutter, with subsequent pacer.  . Atrial fibrillation with rapid ventricular response     a. H/o amiodarone thyroiditis. b. s/p AV node ablation 2008 with implantation of PPM 01/2007, upgraded to CRT-P 06/2007.;takes Coumadin daily  . GERD (gastroesophageal reflux disease)     takes Protonix daily  . Chronic diastolic CHF (congestive heart failure)     takes Lasix daily and Spironolactone   . Myocardial infarction 1996  . Pacemaker     [employer]  . Migraines     last migraine has been a while but headaches dail  . Chronic back pain     deteriorating disc  . History of gout   . Bruises easily   . Hemorrhoids   . History of kidney stones   . Depression     takes Xanax daily  . Insomnia     takes Xanax if needed    Past Surgical History  Procedure Laterality Date  . Esophagogastroduodenoscopy    . Tonsillectomy  ~  1960  . Cardiac catheterization  2006; 2008; 2009; 01/13/2011  . Back surgery    . Laminectomy and microdiscectomy lumbar spine  08/2003  . Cardioversion  10/2004; 02/2007    /E-chart  . Cholecystectomy    . Appendectomy  1954  . Vaginal hysterectomy  ?1981  . Av node ablation  02/2007    /E-chart  . Insert / replace / remove pacemaker  01/2007    initial placement  . Insert / replace / remove pacemaker  06/2007    upgraded/E-chart  . Av fistula placement, brachiocephalic  11/8268    right; "didn't work; my veins were too small"  . Joint replacement    . Total knee arthroplasty  01/2009    left  . Bladder and bowel  1970's    "tacked; damaged my intestines & had to remove some; done @ [facility]  . Coronary angioplasty  2    percutanous  transluminal   . Bilateral cataract surgery    . Bascilic vein transposition Right 06/21/2013    Procedure: BASCILIC VEIN TRANSPOSITION 1ST STAGE;  Surgeon: Serafina Mitchell, MD;  Location[facility] OR;  Service: Vascular;  Laterality: Right;  . Esophagogastroduodenoscopy N/A 07/19/2013    Procedure: ESOPHAGOGASTRODUODENOSCOPY (EGD);  Surgeon: Lafayette Dragon, MD;  Location[facility] ENDOSCOPY;  Service: Endoscopy;  Laterality: N/A;  . Balloon dilation N/A 07/19/2013    Procedure: BALLOON DILATION;  Surgeon: Lafayette Dragon, MD;  Location[facility] ENDOSCOPY;  Service: Endoscopy;  Laterality: N/A;  . Esophagogastroduodenoscopy N/A 07/20/2013    Procedure: ESOPHAGOGASTRODUODENOSCOPY (EGD);  Surgeon: Lafayette Dragon, MD;  Location: [facility] ENDOSCOPY;  Service: Endoscopy;  Laterality: N/A;  with removal of polyp in pylorus  . Bascilic vein transposition Right 09/20/2013    Procedure: 2ND STAGE RIGHT BASCILIC VEIN TRANSPOSITION; ULTRSOUND GUIDED;  Surgeon: Serafina Mitchell, MD;  Location: [facility] OR;  Service: Vascular;  Laterality: Right;    Family History  Problem Relation Age of Onset  . Colon cancer Neg Hx   . Thyroid disease Neg Hx   . Stroke Brother   . Hypertension Brother   . Kidney disease Brother   . Other Mother     Sepsis and perforated colon  . Other Father     Motor vehicle accident    Social History:  reports that she quit smoking about 18 years ago. Her smoking use included Cigarettes. She has a 50 pack-year smoking history. She has never used smokeless tobacco. She reports that she does not drink alcohol or use illicit drugs.  Allergies:  Allergies  Allergen Reactions  . Amiodarone Hives  . Propylthiouracil Rash    Medications:  I have reviewed the patient's current medications. Prior to Admission:  Prescriptions prior to admission  Medication Sig Dispense Refill  . ALPRAZolam (XANAX) 0.5 MG tablet Take 0.5 mg by mouth 3 (three) times daily as needed for anxiety.       Marland Kitchen aspirin EC 81 MG tablet Take 81  mg by mouth daily.      . ferrous sulfate 325 (65 FE) MG tablet Take 325 mg by mouth daily with breakfast.       . furosemide (LASIX) 40 MG tablet Take 2 tablets (80 mg total) by mouth daily.  30 tablet  11  . hydrALAZINE (APRESOLINE) 25 MG tablet Take 25 mg by mouth 3 (three) times daily.      . isosorbide mononitrate (IMDUR) 60 MG 24 hr tablet Take 60 mg by mouth 2 (two) times daily.      Marland Kitchen  lovastatin (MEVACOR) 10 MG tablet Take 10 mg by mouth at bedtime.       . magnesium oxide (MAG-OX) 400 MG tablet Take 400 mg by mouth at bedtime.       . metolazone (ZAROXOLYN) 2.5 MG tablet Take 2.5 mg by mouth daily as needed (take only with a 3-pound or more weight gain in 24 hour period).      . metoprolol tartrate (LOPRESSOR) 25 MG tablet Take 3 tablets (75 mg total) by mouth 3 (three) times daily.  256 tablet  3  . ondansetron (ZOFRAN) 4 MG tablet Take 1 tablet (4 mg total) by mouth every 4 (four) hours as needed for nausea or vomiting.  30 tablet  1  . oxyCODONE-acetaminophen (PERCOCET/ROXICET) 5-325 MG per tablet Take 1 tablet by mouth 3 (three) times daily as needed for moderate pain. For pain  30 tablet  0  . pantoprazole (PROTONIX) 40 MG tablet Take 40 mg by mouth 2 (two) times daily.       . paricalcitol (ZEMPLAR) 1 MCG capsule Take 1 mcg by mouth at bedtime.      . potassium chloride SA (K-DUR,KLOR-CON) 20 MEQ tablet Take 1 tablet (20 mEq total) by mouth daily.  30 tablet  6  . warfarin (COUMADIN) 2 MG tablet Take 2-3 mg by mouth daily at 6 PM. Takes 1 tablets (79m) on Mondays and Fridays.  Takes 1 tablet (224m on all remaining days.      . nitroGLYCERIN (NITROSTAT) 0.4 MG SL tablet Place 0.4 mg under the tongue every 5 (five) minutes as needed for chest pain.       Zemplar 55m59mpo qd,  Results for orders placed during the hospital encounter of 09/26/13 (from the past 48 hour(s))  CBC WITH DIFFERENTIAL     Status: Abnormal   Collection Time    09/26/13  2:35 PM      Result Value Ref Range    WBC 5.5  4.0 - 10.5 K/uL   RBC 4.27  3.87 - 5.11 MIL/uL   Hemoglobin 12.4  12.0 - 15.0 g/dL   HCT 36.6  36.0 - 46.0 %   MCV 85.7  78.0 - 100.0 fL   MCH 29.0  26.0 - 34.0 pg   MCHC 33.9  30.0 - 36.0 g/dL   RDW 15.0  11.5 - 15.5 %   Platelets 194  150 - 400 K/uL   Neutrophils Relative % 78 (*) 43 - 77 %   Neutro Abs 4.3  1.7 - 7.7 K/uL   Lymphocytes Relative 13  12 - 46 %   Lymphs Abs 0.7  0.7 - 4.0 K/uL   Monocytes Relative 8  3 - 12 %   Monocytes Absolute 0.4  0.1 - 1.0 K/uL   Eosinophils Relative 2  0 - 5 %   Eosinophils Absolute 0.1  0.0 - 0.7 K/uL   Basophils Relative 1  0 - 1 %   Basophils Absolute 0.0  0.0 - 0.1 K/uL  COMPREHENSIVE METABOLIC PANEL     Status: Abnormal   Collection Time    09/26/13  2:35 PM      Result Value Ref Range   Sodium 139  137 - 147 mEq/L   Potassium 4.3  3.7 - 5.3 mEq/L   Chloride 99  96 - 112 mEq/L   CO2 22  19 - 32 mEq/L   Glucose, Bld 131 (*) 70 - 99 mg/dL   BUN 67 (*) 6 - 23 mg/dL  Creatinine, Ser 3.68 (*) 0.50 - 1.10 mg/dL   Calcium 9.2  8.4 - 10.5 mg/dL   Total Protein 7.4  6.0 - 8.3 g/dL   Albumin 3.5  3.5 - 5.2 g/dL   AST 30  0 - 37 U/L   ALT 28  0 - 35 U/L   Alkaline Phosphatase 64  39 - 117 U/L   Total Bilirubin 0.9  0.3 - 1.2 mg/dL   GFR calc non Af Amer 11 (*) >90 mL/min   GFR calc Af Amer 13 (*) >90 mL/min   Comment: (NOTE)     The eGFR has been calculated using the CKD EPI equation.     This calculation has not been validated in all clinical situations.     eGFR's persistently <90 mL/min signify possible Chronic Kidney     Disease.  TROPONIN I     Status: None   Collection Time    09/26/13  2:35 PM      Result Value Ref Range   Troponin I <0.30  <0.30 ng/mL   Comment:            Due to the release kinetics of cTnI,     a negative result within the first hours     of the onset of symptoms does not rule out     myocardial infarction with certainty.     If myocardial infarction is still suspected,     repeat the test at  appropriate intervals.  PROTIME-INR     Status: Abnormal   Collection Time    09/26/13  2:35 PM      Result Value Ref Range   Prothrombin Time 18.1 (*) 11.6 - 15.2 seconds   INR 1.54 (*) 0.00 - 1.49  LIPASE, BLOOD     Status: None   Collection Time    09/26/13  2:35 PM      Result Value Ref Range   Lipase 41  11 - 59 U/L  URINALYSIS, DIPSTICK ONLY     Status: Abnormal   Collection Time    09/26/13  4:52 PM      Result Value Ref Range   Specific Gravity, Urine 1.013  1.005 - 1.030   pH 5.5  5.0 - 8.0   Glucose, UA NEGATIVE  NEGATIVE mg/dL   Hgb urine dipstick NEGATIVE  NEGATIVE   Bilirubin Urine NEGATIVE  NEGATIVE   Ketones, ur NEGATIVE  NEGATIVE mg/dL   Protein, ur NEGATIVE  NEGATIVE mg/dL   Urobilinogen, UA 0.2  0.0 - 1.0 mg/dL   Nitrite NEGATIVE  NEGATIVE   Leukocytes, UA SMALL (*) NEGATIVE    Dg Chest Port 1 View  09/26/2013   CLINICAL DATA:  Weakness, nausea  EXAM: PORTABLE CHEST - 1 VIEW  COMPARISON:  07/18/2013  FINDINGS: Cardiomegaly again noted. Three leads cardiac pacemaker is unchanged in position. No acute infiltrate or pleural effusion. No pulmonary edema. Again noted chronic elevation of the right hemidiaphragm  IMPRESSION: Cardiomegaly. Again noted chronic elevation of the right hemidiaphragm. No acute infiltrate or pulmonary edema.   Electronically Signed   By: Lahoma Crocker M.D.   On: 09/26/2013 15:16    ROS Blood pressure 135/55, pulse 59, temperature 97.8 F (36.6 C), temperature source Oral, resp. rate 18, height _0  (1.702 m), weight 92.2 kg (203 lb 4.2 oz), SpO2 99.00%. Physical Exam Physical Examination: General appearance - chronically ill appearing and pale, obese,pleasant HOH Mental status - alert, oriented to person, place, and time Eyes -  pupils equal and reactive, extraocular eye movements intact, funduscopic exam normal, discs flat and sharp Mouth - upper plate, [place] on upper lip Neck - adenopathy noted PCL Lymphatics - posterior cervical  nodes Chest - clear to auscultation, no wheezes, rales or rhonchi, symmetric air entry Heart - S1 and S2 normal, systolic murmur TM6/2 at apex Abdomen - obese, palp PCK, R>L. Nontender, liver down 5 cm , active bs Musculoskeletal - hypertrophic PIPs, DIPs, and esp Knees. TKR L, Lap scars on R Extremities - no pedal edema noted, AVF RUA, redness, and some blistering of skin with hematoma Skin - Pale, bruises, thin  Assessment/Plan: 1 CKD4 with mild deterioration related to poor intake from anesthesia and doubling of Narcotics.  Mild vol depletion.  Hopefully will get better with hydration and less pain meds 2 HTN low , can lower meds and hold diuretics 3 Anemia give IV Fe (low in office) 4. obesity 5. Metabolic Bone Disease:  Cont vit D 6 CAD 7 Pacer 8 AVF doing ok P gentle hydration, po liquids, hold some of meds, clonazepam for restless leg  Neeti Knudtson L 09/26/2013, 7:21 PM

## 2013-09-27 DIAGNOSIS — R110 Nausea: Secondary | ICD-10-CM

## 2013-09-27 DIAGNOSIS — N189 Chronic kidney disease, unspecified: Secondary | ICD-10-CM

## 2013-09-27 DIAGNOSIS — N179 Acute kidney failure, unspecified: Secondary | ICD-10-CM

## 2013-09-27 DIAGNOSIS — I4891 Unspecified atrial fibrillation: Secondary | ICD-10-CM

## 2013-09-27 DIAGNOSIS — D649 Anemia, unspecified: Secondary | ICD-10-CM

## 2013-09-27 LAB — CBC
HEMATOCRIT: 34.8 % — AB (ref 36.0–46.0)
Hemoglobin: 11.7 g/dL — ABNORMAL LOW (ref 12.0–15.0)
MCH: 29.1 pg (ref 26.0–34.0)
MCHC: 33.6 g/dL (ref 30.0–36.0)
MCV: 86.6 fL (ref 78.0–100.0)
Platelets: 166 10*3/uL (ref 150–400)
RBC: 4.02 MIL/uL (ref 3.87–5.11)
RDW: 15.2 % (ref 11.5–15.5)
WBC: 5.5 10*3/uL (ref 4.0–10.5)

## 2013-09-27 LAB — URINALYSIS, ROUTINE W REFLEX MICROSCOPIC
BILIRUBIN URINE: NEGATIVE
GLUCOSE, UA: NEGATIVE mg/dL
Hgb urine dipstick: NEGATIVE
KETONES UR: NEGATIVE mg/dL
Nitrite: NEGATIVE
PROTEIN: 30 mg/dL — AB
Specific Gravity, Urine: 1.015 (ref 1.005–1.030)
Urobilinogen, UA: 0.2 mg/dL (ref 0.0–1.0)
pH: 5.5 (ref 5.0–8.0)

## 2013-09-27 LAB — BASIC METABOLIC PANEL
BUN: 65 mg/dL — AB (ref 6–23)
CO2: 19 mEq/L (ref 19–32)
CREATININE: 3.63 mg/dL — AB (ref 0.50–1.10)
Calcium: 9 mg/dL (ref 8.4–10.5)
Chloride: 100 mEq/L (ref 96–112)
GFR, EST AFRICAN AMERICAN: 13 mL/min — AB (ref >90)
GFR, EST NON AFRICAN AMERICAN: 11 mL/min — AB (ref >90)
GLUCOSE: 103 mg/dL — AB (ref 70–99)
POTASSIUM: 4 meq/L (ref 3.7–5.3)
Sodium: 137 mEq/L (ref 137–147)

## 2013-09-27 LAB — PROTIME-INR
INR: 1.79 — ABNORMAL HIGH (ref 0.00–1.49)
Prothrombin Time: 20.3 seconds — ABNORMAL HIGH (ref 11.6–15.2)

## 2013-09-27 LAB — URINE MICROSCOPIC-ADD ON

## 2013-09-27 MED ORDER — BIOTENE DRY MOUTH MT LIQD
15.0000 mL | Freq: Two times a day (BID) | OROMUCOSAL | Status: DC
  Administered 2013-09-27 – 2013-09-28 (×3): 15 mL via OROMUCOSAL

## 2013-09-27 MED ORDER — METOCLOPRAMIDE HCL 5 MG/ML IJ SOLN
5.0000 mg | Freq: Four times a day (QID) | INTRAMUSCULAR | Status: DC
  Administered 2013-09-27 – 2013-09-28 (×4): 5 mg via INTRAVENOUS
  Filled 2013-09-27: qty 2
  Filled 2013-09-27 (×2): qty 1
  Filled 2013-09-27: qty 2
  Filled 2013-09-27 (×3): qty 1

## 2013-09-27 MED ORDER — WARFARIN SODIUM 5 MG PO TABS
5.0000 mg | ORAL_TABLET | Freq: Once | ORAL | Status: AC
  Administered 2013-09-27: 5 mg via ORAL
  Filled 2013-09-27: qty 1

## 2013-09-27 NOTE — Progress Notes (Signed)
ANTICOAGULATION CONSULT NOTE - Follow-up  Pharmacy Consult for coumadin Indication: atrial fibrillation  Allergies  Allergen Reactions  . Amiodarone Hives  . Propylthiouracil Rash    Patient Measurements: Height: 5\' 7"  (170.2 cm) Weight: 202 lb 6.1 oz (91.8 kg) IBW/kg (Calculated) : 61.6  Vital Signs: Temp: 97.6 F (36.4 C) (02/13 0934) Temp src: Oral (02/13 0524) BP: 136/57 mmHg (02/13 0934) Pulse Rate: 63 (02/13 0934)  Labs:  Recent Labs  09/26/13 1435 09/26/13 2033 09/27/13 0610  HGB 12.4  --  11.7*  HCT 36.6  --  34.8*  PLT 194  --  166  LABPROT 18.1*  --  20.3*  INR 1.54*  --  1.79*  CREATININE 3.68*  --  3.63*  CKTOTAL  --  33  --   TROPONINI <0.30  --   --     Estimated Creatinine Clearance: 15.3 ml/min (by C-G formula based on Cr of 3.63).  Assessment: 21 yof on chronic coumadin presented to the ED with N/V. INR remains subtherapeutic today at 1.79 after boosted dose of 5mg  coumadin last night. H/H slightly low and plts are WNL. No overt bleeding noted but RN noted hematoma on R forearm fistula. Will need to watch closely.   Goal of Therapy:  INR 2-3   Plan:  1. Repeat coumadin 5mg  PO x 1 tonight - hopefully we can reduce her dose back closer to her home dose tomorrow 2. F/u AM INR and S&S of bleeding  Salome Arnt, PharmD, BCPS Pager # [phone] 09/27/2013 10:15 AM

## 2013-09-27 NOTE — Progress Notes (Signed)
[facility] PROGRESS NOTE  [name] JJO:[id number] DOB: [birth year] DOA: 09/26/2013 PCP: Tamsen Roers, MD  Assessment/Plan: Nausea/Vomiting/anorexia/poor PO intake  ? Uremia -add reglan -will request Renal eval  -Clears, PPI, supportive Care  -virus  H/o Gastric polyp s/p removal last month  -biopsy non dysplastic polyp    AKI on CKD 4  -due to N/V/poor Po intake  -gentle hydration  -Renal eval   Pafib  -rate controlled  -continue metoprolol, coumadin   HTN -continue hydralazine and imdur   Code Status: DNR Family Communication: patient Disposition Plan: PT eval once feeling better   Consultants:  renal  Procedures:    Antibiotics:    HPI/Subjective: Still with some nausea Passing gas  Objective: Filed Vitals:   09/27/13 0934  BP: 136/57  Pulse: 63  Temp: 97.6 F (36.4 C)  Resp: 18    Intake/Output Summary (Last 24 hours) at 09/27/13 1101 Last data filed at 09/27/13 0939  Gross per 24 hour  Intake    240 ml  Output    200 ml  Net     40 ml   Filed Weights   09/26/13 1420 09/26/13 1830 09/26/13 2027  Weight: 88.451 kg (195 lb) 92.2 kg (203 lb 4.2 oz) 91.8 kg (202 lb 6.1 oz)    Exam:  General: Appears calm and comfortable, no distress  Cardiovascular: Irregular rate and rhythm  Respiratory: CTA bilaterally, no w/r/r. Normal respiratory effort.  Abdomen: soft, obese, mild distended, NT, BS present   Neurologic: grossly non-focal   Data Reviewed: Basic Metabolic Panel:  Recent Labs Lab 09/26/13 1435 09/27/13 0610  NA 139 137  K 4.3 4.0  CL 99 100  CO2 22 19  GLUCOSE 131* 103*  BUN 67* 65*  CREATININE 3.68* 3.63*  CALCIUM 9.2 9.0   Liver Function Tests:  Recent Labs Lab 09/26/13 1435  AST 30  ALT 28  ALKPHOS 64  BILITOT 0.9  PROT 7.4  ALBUMIN 3.5    Recent Labs Lab 09/26/13 1435  LIPASE 41   No results found for this basename: AMMONIA,  in the last 168 hours CBC:  Recent Labs Lab  09/26/13 1435 09/27/13 0610  WBC 5.5 5.5  NEUTROABS 4.3  --   HGB 12.4 11.7*  HCT 36.6 34.8*  MCV 85.7 86.6  PLT 194 166   Cardiac Enzymes:  Recent Labs Lab 09/26/13 1435 09/26/13 2033  CKTOTAL  --  33  TROPONINI <0.30  --    BNP (last 3 results)  Recent Labs  03/27/13 1218 07/09/13 1701 07/18/13 1137  PROBNP 288.0* 412.0* 14148.0*   CBG: No results found for this basename: GLUCAP,  in the last 168 hours  Recent Results (from the past 240 hour(s))  SURGICAL PCR SCREEN     Status: None   Collection Time    09/19/13 12:58 PM      Result Value Ref Range Status   MRSA, PCR NEGATIVE  NEGATIVE Final   Staphylococcus aureus NEGATIVE  NEGATIVE Final   Comment:            The Xpert SA Assay (FDA     approved for NASAL specimens     in patients over 68 years of age),     is one component of     a comprehensive surveillance     program.  Test performance has     been validated by [employer] for patients greater     than or equal to 1  year old.     It is not intended     to diagnose infection nor to     guide or monitor treatment.     Studies: Dg Chest Port 1 View  09/26/2013   CLINICAL DATA:  Weakness, nausea  EXAM: PORTABLE CHEST - 1 VIEW  COMPARISON:  07/18/2013  FINDINGS: Cardiomegaly again noted. Three leads cardiac pacemaker is unchanged in position. No acute infiltrate or pleural effusion. No pulmonary edema. Again noted chronic elevation of the right hemidiaphragm  IMPRESSION: Cardiomegaly. Again noted chronic elevation of the right hemidiaphragm. No acute infiltrate or pulmonary edema.   Electronically Signed   By: Lahoma Crocker M.D.   On: 09/26/2013 15:16    Scheduled Meds: . antiseptic oral rinse  15 mL Mouth Rinse BID  . aspirin EC  81 mg Oral Daily  . enoxaparin (LOVENOX) injection  30 mg Subcutaneous Q24H  . isosorbide mononitrate  60 mg Oral BID  . metoCLOPramide (REGLAN) injection  5 mg Intravenous 4 times per day  . metoprolol tartrate  25 mg  Oral BID  . pantoprazole (PROTONIX) IV  40 mg Intravenous Q24H  . paricalcitol  1 mcg Oral QHS  . simvastatin  20 mg Oral q1800  . warfarin  5 mg Oral ONCE-1800  . Warfarin - Pharmacist Dosing Inpatient   Does not apply q1800   Continuous Infusions: . sodium chloride 75 mL/hr at 09/26/13 2040    Active Problems:   ANEMIA   HYPERTENSION   CAD, UNSPECIFIED SITE   CKD (chronic kidney disease) stage 4, GFR 15-29 ml/min   AKI (acute kidney injury)    Time spent: 25 min    Eliseo Squires Sofi Bryars  [facility] Pager [phone] If 7PM-7AM, please contact night-coverage at [email or web address], password [facility] 09/27/2013, 11:01 AM  LOS: 1 day

## 2013-09-27 NOTE — Progress Notes (Signed)
PT Cancellation Note  Patient Details Name: [name] MRN: [id number] DOB: [birth year]   Cancelled Treatment:    Reason Eval/Treat Not Completed: Patient declined, due to nausea.  PT to check back tomorrow.   Thanks, Barbarann Ehlers. [place], [place], DPT [phone]   09/27/2013, 3:55 PM

## 2013-09-27 NOTE — Progress Notes (Signed)
Subjective:  Not feeling much different, still nauseated but latest med may be helping (reglan).  UOP down.  Kidney function did not change too much overnight.   Objective Vital signs in last 24 hours: Filed Vitals:   09/26/13 1830 09/26/13 2027 09/27/13 0524 09/27/13 0934  BP: 135/55 134/61 129/53 136/57  Pulse: 59 62 62 63  Temp: 97.8 F (36.6 C) 97.8 F (36.6 C) 98.1 F (36.7 C) 97.6 F (36.4 C)  TempSrc:  Oral Oral   Resp: 18 18 18 18   Height: 5\' 7"  (1.702 m)     Weight: 92.2 kg (203 lb 4.2 oz) 91.8 kg (202 lb 6.1 oz)    SpO2: 99% 96% 96% 98%   Weight change:   Intake/Output Summary (Last 24 hours) at 09/27/13 1126 Last data filed at 09/27/13 5170  Gross per 24 hour  Intake    240 ml  Output    200 ml  Net     40 ml    Assessment/ Plan: Pt is a 79 y.o. yo female with advanced CKD at baseline who was admitted on 09/26/2013 with nausea/vomiting poor PO intake and some A on CKD  Assessment/Plan: 1. Renal - A on CKD in the setting of above. Receiving IVF and diuretics on hold.  GFR is not that much different than her baseline.  Hoping that the latest symptoms of nausea do not represent uremia.   No indications for dialysis at present, will continue to follow.  2. HTN/volume- seems dry, getting IVF and diuretics on hold  3. Nausea/vomiting- better today with reglan.  Per Hospitalists.  4. Secondary hyperparathyroidism- continue OP zemplar 5. Anemia- s/p iron repletion- hgb above threshold so no ESA is needed  [name] A    Labs: Basic Metabolic Panel:  Recent Labs Lab 09/26/13 1435 09/27/13 0610  NA 139 137  K 4.3 4.0  CL 99 100  CO2 22 19  GLUCOSE 131* 103*  BUN 67* 65*  CREATININE 3.68* 3.63*  CALCIUM 9.2 9.0   Liver Function Tests:  Recent Labs Lab 09/26/13 1435  AST 30  ALT 28  ALKPHOS 64  BILITOT 0.9  PROT 7.4  ALBUMIN 3.5    Recent Labs Lab 09/26/13 1435  LIPASE 41   No results found for this basename: AMMONIA,  in the last 168  hours CBC:  Recent Labs Lab 09/26/13 1435 09/27/13 0610  WBC 5.5 5.5  NEUTROABS 4.3  --   HGB 12.4 11.7*  HCT 36.6 34.8*  MCV 85.7 86.6  PLT 194 166   Cardiac Enzymes:  Recent Labs Lab 09/26/13 1435 09/26/13 2033  CKTOTAL  --  33  TROPONINI <0.30  --    CBG: No results found for this basename: GLUCAP,  in the last 168 hours  Iron Studies: No results found for this basename: IRON, TIBC, TRANSFERRIN, FERRITIN,  in the last 72 hours Studies/Results: Dg Chest Port 1 View  09/26/2013   CLINICAL DATA:  Weakness, nausea  EXAM: PORTABLE CHEST - 1 VIEW  COMPARISON:  07/18/2013  FINDINGS: Cardiomegaly again noted. Three leads cardiac pacemaker is unchanged in position. No acute infiltrate or pleural effusion. No pulmonary edema. Again noted chronic elevation of the right hemidiaphragm  IMPRESSION: Cardiomegaly. Again noted chronic elevation of the right hemidiaphragm. No acute infiltrate or pulmonary edema.   Electronically Signed   By: Lahoma Crocker M.D.   On: 09/26/2013 15:16   Medications: Infusions: . sodium chloride 75 mL/hr at 09/26/13 2040    Scheduled Medications: .  antiseptic oral rinse  15 mL Mouth Rinse BID  . aspirin EC  81 mg Oral Daily  . enoxaparin (LOVENOX) injection  30 mg Subcutaneous Q24H  . isosorbide mononitrate  60 mg Oral BID  . metoCLOPramide (REGLAN) injection  5 mg Intravenous 4 times per day  . metoprolol tartrate  25 mg Oral BID  . pantoprazole (PROTONIX) IV  40 mg Intravenous Q24H  . paricalcitol  1 mcg Oral QHS  . simvastatin  20 mg Oral q1800  . warfarin  5 mg Oral ONCE-1800  . Warfarin - Pharmacist Dosing Inpatient   Does not apply q1800    have reviewed scheduled and prn medications.  Physical Exam: General: alert, NAD Heart: RRR Lungs: mostly clear Abdomen: soft, non tender Extremities: no edema Dialysis Access: right BVT- bruised- bruit is present    09/27/2013,11:26 AM  LOS: 1 day

## 2013-09-28 DIAGNOSIS — I10 Essential (primary) hypertension: Secondary | ICD-10-CM

## 2013-09-28 DIAGNOSIS — R110 Nausea: Secondary | ICD-10-CM

## 2013-09-28 LAB — PROTIME-INR
INR: 2.61 — AB (ref 0.00–1.49)
Prothrombin Time: 27 seconds — ABNORMAL HIGH (ref 11.6–15.2)

## 2013-09-28 LAB — BASIC METABOLIC PANEL
BUN: 60 mg/dL — AB (ref 6–23)
CHLORIDE: 103 meq/L (ref 96–112)
CO2: 21 mEq/L (ref 19–32)
Calcium: 9.1 mg/dL (ref 8.4–10.5)
Creatinine, Ser: 3.41 mg/dL — ABNORMAL HIGH (ref 0.50–1.10)
GFR calc Af Amer: 14 mL/min — ABNORMAL LOW (ref >90)
GFR calc non Af Amer: 12 mL/min — ABNORMAL LOW (ref >90)
GLUCOSE: 95 mg/dL (ref 70–99)
Potassium: 4.3 mEq/L (ref 3.7–5.3)
Sodium: 140 mEq/L (ref 137–147)

## 2013-09-28 LAB — CBC
HEMATOCRIT: 33.1 % — AB (ref 36.0–46.0)
HEMOGLOBIN: 11 g/dL — AB (ref 12.0–15.0)
MCH: 29.1 pg (ref 26.0–34.0)
MCHC: 33.2 g/dL (ref 30.0–36.0)
MCV: 87.6 fL (ref 78.0–100.0)
Platelets: 161 10*3/uL (ref 150–400)
RBC: 3.78 MIL/uL — ABNORMAL LOW (ref 3.87–5.11)
RDW: 15.5 % (ref 11.5–15.5)
WBC: 6 10*3/uL (ref 4.0–10.5)

## 2013-09-28 MED ORDER — WARFARIN SODIUM 3 MG PO TABS: 3.0000 mg | ORAL_TABLET | ORAL | Status: DC

## 2013-09-28 MED ORDER — WARFARIN SODIUM 2 MG PO TABS
2.0000 mg | ORAL_TABLET | ORAL | Status: DC
  Filled 2013-09-28: qty 1

## 2013-09-28 MED ORDER — METOPROLOL TARTRATE 25 MG PO TABS: 25.0000 mg | ORAL_TABLET | Freq: Two times a day (BID) | ORAL | Status: DC

## 2013-09-28 MED ORDER — METOCLOPRAMIDE HCL 5 MG PO TABS: 5.0000 mg | ORAL_TABLET | Freq: Three times a day (TID) | ORAL | Status: DC

## 2013-09-28 NOTE — ED Provider Notes (Signed)
I saw and evaluated the patient, reviewed the resident's note and I agree with the findings and plan.   Patient with nausea. Some dehydration. Has been vomiting since surgery 5 days previously. Will admit to internal medicine  [name]. Alvino Chapel, MD 09/28/13 1517

## 2013-09-28 NOTE — Progress Notes (Signed)
09/28/13 1507 nsg Patient d/c to home per wheelchair accompanied by NT ; son in room . D/c instructions understood; prescriptions given.

## 2013-09-28 NOTE — Discharge Summary (Addendum)
Physician Discharge Summary  [name]:[id number] DOB: Dec 09, 1934 DOA: 09/26/2013  PCP: Tamsen Roers, MD  Admit date: 09/26/2013 Discharge date: 09/28/2013  Time spent: 35 minutes  Recommendations for Outpatient Follow-up:  1. BMP 1 week- if stable- resume diuretics 2. PT/INR 1 week 3. D/c'd on reglan for post op n/v- stop once this has resolved  Discharge Diagnoses:  Principal Problem:   Nausea alone Active Problems:   ANEMIA   HYPERTENSION   CAD, UNSPECIFIED SITE   CKD (chronic kidney disease) stage 4, GFR 15-29 ml/min   AKI (acute kidney injury)   Discharge Condition: improved  Diet recommendation: cardiac  Filed Weights   09/26/13 1830 09/26/13 2027 09/27/13 2022  Weight: 92.2 kg (203 lb 4.2 oz) 91.8 kg (202 lb 6.1 oz) 91.8 kg (202 lb 6.1 oz)    History of present illness:  [name] is a 79 y.o. female with PMH of CAD, Pafib on coumadin, SSS s/p pacemaker, CKD 4, h/o gastric polyp s/p removal in December.  Has had anorexia, nausea, some intermittent vomiting that worsened on Friday.  She has had intermittent symptoms off and on for 1-2 month.  She had brachial vein transposition on 2/6 by Dr.Brabham, and reports worsening nausea and vomiting since then.  Denies any medication changes, no fevers or chills, no cough, congestion, no diarrhea.  In ER noted to have worsening of kidney function   Hospital Course:  Nausea/Vomiting/anorexia/poor PO intake  -add reglan with success -advanced diet  H/o Gastric polyp s/p removal last month  -biopsy non dysplastic polyp   AKI on CKD 4  -due to N/V/poor Po intake  -gentle hydration  -Renal eval  -holding diuretics until next BMP 1 week  Pafib  -rate controlled  -continue metoprolol, coumadin   HTN -continue hydralazine and imdur   Procedures:    Consultations:  Nephro  Discharge Exam: Filed Vitals:   09/28/13 1000  BP: 139/60  Pulse: 60  Temp: 97.6 F (36.4 C)  Resp: 18     General: A+Ox3, NAD Cardiovascular: irr Respiratory: clear anterior  Discharge Instructions      Discharge Orders   Future Appointments Provider Department Dept Phone   10/22/2013 11:15 AM Josue Hector, MD [facility] [phone]   11/04/2013 1:00 PM [facility] [address] [phone]   11/04/2013 2:15 PM Serafina Mitchell, MD [facility] [phone]   Future Orders Complete By Expires   Diet - low sodium heart healthy  As directed    Discharge instructions  As directed    Comments:     BMP 1 week -resume lasix once PCP recommends   Increase activity slowly  As directed        Medication List    STOP taking these medications       furosemide 40 MG tablet  Commonly known as:  LASIX     metolazone 2.5 MG tablet  Commonly known as:  ZAROXOLYN     potassium chloride SA 20 MEQ tablet  Commonly known as:  K-DUR,KLOR-CON      TAKE these medications       ALPRAZolam 0.5 MG tablet  Commonly known as:  XANAX  Take 0.5 mg by mouth 3 (three) times daily as needed for anxiety.     aspirin EC 81 MG tablet  Take 81 mg by mouth daily.     ferrous sulfate 325 (65 FE) MG tablet  Take 325 mg by mouth  daily with breakfast.     hydrALAZINE 25 MG tablet  Commonly known as:  APRESOLINE  Take 25 mg by mouth 3 (three) times daily.     isosorbide mononitrate 60 MG 24 hr tablet  Commonly known as:  IMDUR  Take 60 mg by mouth 2 (two) times daily.     lovastatin 10 MG tablet  Commonly known as:  MEVACOR  Take 10 mg by mouth at bedtime.     magnesium oxide 400 MG tablet  Commonly known as:  MAG-OX  Take 400 mg by mouth at bedtime.     metoCLOPramide 5 MG tablet  Commonly known as:  REGLAN  Take 1 tablet (5 mg total) by mouth 3 (three) times daily before meals.     metoprolol tartrate 25 MG tablet  Commonly known as:  LOPRESSOR  Take 1 tablet (25 mg total) by mouth 2 (two) times daily.      nitroGLYCERIN 0.4 MG SL tablet  Commonly known as:  NITROSTAT  Place 0.4 mg under the tongue every 5 (five) minutes as needed for chest pain.     ondansetron 4 MG tablet  Commonly known as:  ZOFRAN  Take 1 tablet (4 mg total) by mouth every 4 (four) hours as needed for nausea or vomiting.     oxyCODONE-acetaminophen 5-325 MG per tablet  Commonly known as:  PERCOCET/ROXICET  Take 1 tablet by mouth 3 (three) times daily as needed for moderate pain. For pain     paricalcitol 1 MCG capsule  Commonly known as:  ZEMPLAR  Take 1 mcg by mouth at bedtime.     PROTONIX 40 MG tablet  Generic drug:  pantoprazole  Take 40 mg by mouth 2 (two) times daily.     warfarin 2 MG tablet  Commonly known as:  COUMADIN  Take 2-3 mg by mouth daily at 6 PM. Takes 1 tablets (3mg ) on Mondays and Fridays.  Takes 1 tablet (2mg ) on all remaining days.       Allergies  Allergen Reactions  . Amiodarone Hives  . Propylthiouracil Rash   Follow-up Information   Follow up with LITTLE,JAMES, MD In 1 week.   Specialty:  Family Medicine   Contact information:   [address] [place] [address] [phone]       Follow up with COLADONATO,JOSEPH A, MD In 2 weeks.   Specialty:  Nephrology   Contact information:   [place] [address] [phone]        The results of significant diagnostics from this hospitalization (including imaging, microbiology, ancillary and laboratory) are listed below for reference.    Significant Diagnostic Studies: Dg Chest Port 1 View  09/26/2013   CLINICAL DATA:  Weakness, nausea  EXAM: PORTABLE CHEST - 1 VIEW  COMPARISON:  07/18/2013  FINDINGS: Cardiomegaly again noted. Three leads cardiac pacemaker is unchanged in position. No acute infiltrate or pleural effusion. No pulmonary edema. Again noted chronic elevation of the right hemidiaphragm  IMPRESSION: Cardiomegaly. Again noted chronic elevation of the right hemidiaphragm. No  acute infiltrate or pulmonary edema.   Electronically Signed   By: Lahoma Crocker M.D.   On: 09/26/2013 15:16    Microbiology: Recent Results (from the past 240 hour(s))  SURGICAL PCR SCREEN     Status: None   Collection Time    09/19/13 12:58 PM      Result Value Ref Range Status   MRSA, PCR NEGATIVE  NEGATIVE Final  Staphylococcus aureus NEGATIVE  NEGATIVE Final   Comment:            The Xpert SA Assay (FDA     approved for NASAL specimens     in patients over 63 years of age),     is one component of     a comprehensive surveillance     program.  Test performance has     been validated by [employer] for patients greater     than or equal to 49 year old.     It is not intended     to diagnose infection nor to     guide or monitor treatment.     Labs: Basic Metabolic Panel:  Recent Labs Lab 09/26/13 1435 09/27/13 0610 09/28/13 0335  NA 139 137 140  K 4.3 4.0 4.3  CL 99 100 103  CO2 22 19 21   GLUCOSE 131* 103* 95  BUN 67* 65* 60*  CREATININE 3.68* 3.63* 3.41*  CALCIUM 9.2 9.0 9.1   Liver Function Tests:  Recent Labs Lab 09/26/13 1435  AST 30  ALT 28  ALKPHOS 64  BILITOT 0.9  PROT 7.4  ALBUMIN 3.5    Recent Labs Lab 09/26/13 1435  LIPASE 41   No results found for this basename: AMMONIA,  in the last 168 hours CBC:  Recent Labs Lab 09/26/13 1435 09/27/13 0610 09/28/13 0335  WBC 5.5 5.5 6.0  NEUTROABS 4.3  --   --   HGB 12.4 11.7* 11.0*  HCT 36.6 34.8* 33.1*  MCV 85.7 86.6 87.6  PLT 194 166 161   Cardiac Enzymes:  Recent Labs Lab 09/26/13 1435 09/26/13 2033  CKTOTAL  --  33  TROPONINI <0.30  --    BNP: BNP (last 3 results)  Recent Labs  03/27/13 1218 07/09/13 1701 07/18/13 1137  PROBNP 288.0* 412.0* 14148.0*   CBG: No results found for this basename: GLUCAP,  in the last 168 hours     Signed:  Eliseo Squires, JESSICA  [facility] 09/28/2013, 11:27 AM

## 2013-09-28 NOTE — Progress Notes (Signed)
ANTICOAGULATION CONSULT NOTE - Follow-up  Pharmacy Consult for coumadin Indication: atrial fibrillation  Allergies  Allergen Reactions  . Amiodarone Hives  . Propylthiouracil Rash   Patient Measurements: Height: 5\' 7"  (170.2 cm) Weight: 202 lb 6.1 oz (91.8 kg) IBW/kg (Calculated) : 61.6  Vital Signs: Temp: 97.6 F (36.4 C) (02/14 0456) Temp src: Axillary (02/14 0456) BP: 147/53 mmHg (02/14 0456) Pulse Rate: 62 (02/14 0456)  Labs:  Recent Labs  09/26/13 1435 09/26/13 2033 09/27/13 0610 09/28/13 0335  HGB 12.4  --  11.7* 11.0*  HCT 36.6  --  34.8* 33.1*  PLT 194  --  166 161  LABPROT 18.1*  --  20.3* 27.0*  INR 1.54*  --  1.79* 2.61*  CREATININE 3.68*  --  3.63* 3.41*  CKTOTAL  --  33  --   --   TROPONINI <0.30  --   --   --    Estimated Creatinine Clearance: 16.3 ml/min (by C-G formula based on Cr of 3.41).  Assessment: 33 yof on chronic coumadin presented to the ED with N/V. INR jumped overnight after 2 x 5mg  doses and is now within therapeutic range of 2.0-3.0.  There is some anemia present but overall H/H is stable.  Her plts are WNL. She has been on SQ Lovenox as a bridge for Warfarin.  No overt bleeding noted but RN noted hematoma on R forearm fistula. Will need to watch closely.   HOME Dose:  She takes 3 mg on Mondays/Fridays and 2mg  all remaining days.  Her INR was sub-therapeutic on admit (likely due to holding doses for surgery ~ 1week ago)  Drug/Drug Interactions:  Anticoagulants / Salicylates The risk of bleeding, particularly gastrointestinal, may be increased by co-administration of warfarin with aspirin EC. However, use of low-dose aspirin with warfarin may provide benefit that outweighs the risk of minor bleeding.  No further medications noted to be a major interaction for her.  Goal of Therapy:  INR 2-3   Plan:  1.  Will discontinue her Lovenox. 2.  Will plan to resume her home dose of Warfarin this evening. 3.  Monitor for s/s of bleeding  complications, drug interactions and response to therapy with INR's.  Rober Minion, PharmD., MS Clinical Pharmacist Pager:  [phone] Thank you for allowing pharmacy to be part of this patients care team.  09/28/2013 7:22 AM

## 2013-09-28 NOTE — Progress Notes (Signed)
PT Cancellation Note  Patient Details Name: [name] MRN: [id number] DOB: 26-Apr-1935   Cancelled Treatment:    Reason Eval/Treat Not Completed: PT screened, no needs identified, will sign off; patient awaiting d/c and reports no issues with mobility; family member in room and agrees.  Will sign off.   Sloka Volante,CYNDI 09/28/2013, 1:47 PM

## 2013-09-28 NOTE — Progress Notes (Signed)
Subjective:  Feeling better on reglan- wants to go home.  UOP not great, not sure all recorded- creatinine stable to improved  Objective Vital signs in last 24 hours: Filed Vitals:   09/27/13 1453 09/27/13 1606 09/27/13 2022 09/28/13 0456  BP: 136/66 142/73 140/62 147/53  Pulse: 60 60 63 62  Temp: 97.9 F (36.6 C) 97.2 F (36.2 C) 97.7 F (36.5 C) 97.6 F (36.4 C)  TempSrc:   Oral Axillary  Resp: 17 17 18 20   Height:   5\' 7"  (1.702 m)   Weight:   91.8 kg (202 lb 6.1 oz)   SpO2: 96% 97% 97% 96%   Weight change: 3.348 kg (7 lb 6.1 oz)  Intake/Output Summary (Last 24 hours) at 09/28/13 0945 Last data filed at 09/28/13 0245  Gross per 24 hour  Intake    460 ml  Output    450 ml  Net     10 ml    Assessment/ Plan: Pt is a 79 y.o. yo female with advanced CKD at baseline who was admitted on 09/26/2013 with nausea/vomiting poor PO intake and some A on CKD  Assessment/Plan: 1. Renal - A on CKD in the setting of above. Receiving IVF and diuretics on hold while here.  GFR is not that much different than her baseline.  Hoping that the latest symptoms of nausea do not represent uremia.   No indications for dialysis at present.  I think is Ok for discharge if able to take PO's 2. HTN/volume- seems dry, getting IVF and diuretics on hold while here- OK to resume at disharge 3. Nausea/vomiting- better today with reglan.  Per Hospitalists.  4. Secondary hyperparathyroidism- continue OP zemplar 5. Anemia- s/p iron repletion- hgb above threshold so no ESA is needed 6.  Dispo- I am Ok if patient gets discharged today- will make sure she has appropriate follow up with Dr. Nadeen Landau A    Labs: Basic Metabolic Panel:  Recent Labs Lab 09/26/13 1435 09/27/13 0610 09/28/13 0335  NA 139 137 140  K 4.3 4.0 4.3  CL 99 100 103  CO2 22 19 21   GLUCOSE 131* 103* 95  BUN 67* 65* 60*  CREATININE 3.68* 3.63* 3.41*  CALCIUM 9.2 9.0 9.1   Liver Function Tests:  Recent Labs Lab  09/26/13 1435  AST 30  ALT 28  ALKPHOS 64  BILITOT 0.9  PROT 7.4  ALBUMIN 3.5    Recent Labs Lab 09/26/13 1435  LIPASE 41   No results found for this basename: AMMONIA,  in the last 168 hours CBC:  Recent Labs Lab 09/26/13 1435 09/27/13 0610 09/28/13 0335  WBC 5.5 5.5 6.0  NEUTROABS 4.3  --   --   HGB 12.4 11.7* 11.0*  HCT 36.6 34.8* 33.1*  MCV 85.7 86.6 87.6  PLT 194 166 161   Cardiac Enzymes:  Recent Labs Lab 09/26/13 1435 09/26/13 2033  CKTOTAL  --  33  TROPONINI <0.30  --    CBG: No results found for this basename: GLUCAP,  in the last 168 hours  Iron Studies: No results found for this basename: IRON, TIBC, TRANSFERRIN, FERRITIN,  in the last 72 hours Studies/Results: Dg Chest Port 1 View  09/26/2013   CLINICAL DATA:  Weakness, nausea  EXAM: PORTABLE CHEST - 1 VIEW  COMPARISON:  07/18/2013  FINDINGS: Cardiomegaly again noted. Three leads cardiac pacemaker is unchanged in position. No acute infiltrate or pleural effusion. No pulmonary edema. Again noted chronic elevation of the right  hemidiaphragm  IMPRESSION: Cardiomegaly. Again noted chronic elevation of the right hemidiaphragm. No acute infiltrate or pulmonary edema.   Electronically Signed   By: Lahoma Crocker M.D.   On: 09/26/2013 15:16   Medications: Infusions: . sodium chloride 75 mL/hr at 09/27/13 2218    Scheduled Medications: . antiseptic oral rinse  15 mL Mouth Rinse BID  . aspirin EC  81 mg Oral Daily  . isosorbide mononitrate  60 mg Oral BID  . metoCLOPramide (REGLAN) injection  5 mg Intravenous 4 times per day  . metoprolol tartrate  25 mg Oral BID  . pantoprazole (PROTONIX) IV  40 mg Intravenous Q24H  . paricalcitol  1 mcg Oral QHS  . simvastatin  20 mg Oral q1800  . warfarin  2 mg Oral Once per day on Sun Tue Wed Thu Sat  . [START ON 09/30/2013] warfarin  3 mg Oral Once per day on Mon Fri  . Warfarin - Pharmacist Dosing Inpatient   Does not apply q1800    have reviewed scheduled and prn  medications.  Physical Exam: General: alert, NAD Heart: RRR Lungs: mostly clear Abdomen: soft, non tender Extremities: no edema Dialysis Access: right BVT- bruised- bruit is present - looks better    09/28/2013,9:45 AM  LOS: 2 days

## 2013-10-02 ENCOUNTER — Telehealth: Payer: Self-pay | Admitting: *Deleted

## 2013-10-02 DIAGNOSIS — Z79899 Other long term (current) drug therapy: Secondary | ICD-10-CM

## 2013-10-02 MED ORDER — FUROSEMIDE 40 MG PO TABS: 40.0000 mg | ORAL_TABLET | Freq: Two times a day (BID) | ORAL | Status: DC

## 2013-10-02 NOTE — Telephone Encounter (Signed)
SPOKE WITH  CHF  PHONE  NURSE  AND  PT'S SON WAS  ON PHONE PER  SON  PT  HAS  HAD  WEIGHT  GAIN  OF 10 POUNDS IN  2  DAYS  WAS  RECENTLY  DISCHARGED ON  NO DIURETIC DISCUSSED WITH  DR Johnsie Cancel RESTART  FUROSEMIDE   40 MG  TWICE  DAILY  AND  CHECK  BMET  BNP . SON AWARE  OF  NEW   TREATMENT  PLAN /CY

## 2013-10-04 ENCOUNTER — Other Ambulatory Visit (INDEPENDENT_AMBULATORY_CARE_PROVIDER_SITE_OTHER): Payer: Medicare Other

## 2013-10-04 DIAGNOSIS — Z79899 Other long term (current) drug therapy: Secondary | ICD-10-CM

## 2013-10-04 LAB — BASIC METABOLIC PANEL
BUN: 48 mg/dL — AB (ref 6–23)
CO2: 20 meq/L (ref 19–32)
CREATININE: 2.6 mg/dL — AB (ref 0.4–1.2)
Calcium: 9 mg/dL (ref 8.4–10.5)
Chloride: 105 mEq/L (ref 96–112)
GFR: 18.64 mL/min — ABNORMAL LOW (ref >60.00)
Glucose, Bld: 120 mg/dL — ABNORMAL HIGH (ref 70–99)
Potassium: 3.7 mEq/L (ref 3.5–5.1)
Sodium: 137 mEq/L (ref 135–145)

## 2013-10-04 LAB — BRAIN NATRIURETIC PEPTIDE: Pro B Natriuretic peptide (BNP): 1143 pg/mL — ABNORMAL HIGH (ref 0.0–100.0)

## 2013-10-22 ENCOUNTER — Encounter: Payer: Self-pay | Admitting: Cardiovascular Disease

## 2013-10-22 ENCOUNTER — Ambulatory Visit (INDEPENDENT_AMBULATORY_CARE_PROVIDER_SITE_OTHER): Payer: Medicare Other | Admitting: Cardiovascular Disease

## 2013-10-22 ENCOUNTER — Encounter: Payer: Self-pay | Admitting: Physician Assistant

## 2013-10-22 VITALS — BP 140/60 | HR 62 | Ht 67.0 in | Wt 211.0 lb

## 2013-10-22 DIAGNOSIS — N259 Disorder resulting from impaired renal tubular function, unspecified: Secondary | ICD-10-CM

## 2013-10-22 DIAGNOSIS — I10 Essential (primary) hypertension: Secondary | ICD-10-CM

## 2013-10-22 DIAGNOSIS — I2510 Atherosclerotic heart disease of native coronary artery without angina pectoris: Secondary | ICD-10-CM

## 2013-10-22 DIAGNOSIS — Z8719 Personal history of other diseases of the digestive system: Secondary | ICD-10-CM

## 2013-10-22 NOTE — Assessment & Plan Note (Signed)
Well controlled.  Continue current medications and low sodium Dash type diet.    

## 2013-10-22 NOTE — Assessment & Plan Note (Signed)
Recent exacerbation with dehydration form nausea and vohmiting  Cr back down to baseline 2.6 F/U Brabham for fistula that has not healed well

## 2013-10-22 NOTE — Progress Notes (Signed)
Patient ID: [name], female   DOB: 19-Oct-1934, 79 y.o.   MRN: [id number] [name] is a 79 y.o. female with a hx of CAD, s/p DES to the OM in 6/06, rotational atherectomy + DES to pLAD in 7/09, symptomatic bradycardia status post pacemaker with upgrade to CRT-P 11/08, atrial fibrillation with prior AV node ablation (prior amiodarone thyroiditis), chronic diastolic CHF, mitral regurgitation, chronic kidney disease stage IV (followed by Dr. Phineas Semen), HTN, HL, GERD, prior upper GI bleed in 2008 related to NSAID use, and PVD.  Echo (03/2013): Mild focal basal septal hypertrophy, EF 55-60%, mild MR, moderate to severe LAE, mild to moderate RVE, reduced RVSF, moderate RAE.  She has most recently been followed closely with chest pain. Medications have been adjusted. Dr. Johnsie Cancel has been trying to avoid cardiac catheterization due to chronic renal failure. Adequate access was obtained and she did end up being recathed by Dr. Burt Knack After failing on medical therapy - no critical disease and Cr remained stable.   . Had just been discharged with dysphagia and had EGD showing large prepyloric ulcer with subsequent removal after stopping anticoagulation. Her Norvasc was stopped (looks like due to swelling) and lasix was increased at that visit.   Hospitalized for nausea and CRF with adjustment of diuretics  Cr on 2/20 back down to 2.6   Still with lots of nausea irregularity and stomach pain needs f/u wth Ardis Hughs    ROS: Denies fever, malais, weight loss, blurry vision, decreased visual acuity, cough, sputum, SOB, hemoptysis, pleuritic pain, palpitaitons, heartburn, abdominal pain, melena, lower extremity edema, claudication, or rash.  All other systems reviewed and negative   General: Affect appropriate Chronically ill female  HEENT: normal Neck supple with no adenopathy JVP normal no bruits no thyromegaly Lungs clear with no wheezing and good diaphragmatic motion Heart:  S1/S2 SEM   murmur,rub, gallop or click PMI normal Abdomen: benighn, BS positve, no tenderness, no AAA no bruit.  No HSM or HJR Distal pulses intact with no bruits No edema Neuro non-focal Skin warm and dry Fistula RuE with one area of dehiscence   Medications Current Outpatient Prescriptions  Medication Sig Dispense Refill  . ALPRAZolam (XANAX) 0.5 MG tablet Take 0.5 mg by mouth 3 (three) times daily as needed for anxiety.       Marland Kitchen aspirin EC 81 MG tablet Take 81 mg by mouth daily.      . ferrous sulfate 325 (65 FE) MG tablet Take 325 mg by mouth daily with breakfast.       . furosemide (LASIX) 40 MG tablet Take 1 tablet (40 mg total) by mouth 2 (two) times daily.  90 tablet  3  . hydrALAZINE (APRESOLINE) 25 MG tablet Take 25 mg by mouth 3 (three) times daily.      . isosorbide mononitrate (IMDUR) 60 MG 24 hr tablet Take 60 mg by mouth 2 (two) times daily.      Marland Kitchen lovastatin (MEVACOR) 10 MG tablet Take 10 mg by mouth at bedtime.       . magnesium oxide (MAG-OX) 400 MG tablet Take 400 mg by mouth at bedtime.       . metoCLOPramide (REGLAN) 5 MG tablet Take 1 tablet (5 mg total) by mouth 3 (three) times daily before meals.  30 tablet  0  . metoprolol tartrate (LOPRESSOR) 25 MG tablet Take 1 tablet (25 mg total) by mouth 2 (two) times daily.  60 tablet  0  . nitroGLYCERIN (NITROSTAT) 0.4 MG  SL tablet Place 0.4 mg under the tongue every 5 (five) minutes as needed for chest pain.      Marland Kitchen ondansetron (ZOFRAN) 4 MG tablet Take 1 tablet (4 mg total) by mouth every 4 (four) hours as needed for nausea or vomiting.  30 tablet  1  . oxyCODONE-acetaminophen (PERCOCET/ROXICET) 5-325 MG per tablet Take 1 tablet by mouth 3 (three) times daily as needed for moderate pain. For pain  30 tablet  0  . pantoprazole (PROTONIX) 40 MG tablet Take 40 mg by mouth 2 (two) times daily.       . paricalcitol (ZEMPLAR) 1 MCG capsule Take 1 mcg by mouth at bedtime.      . potassium chloride SA (K-DUR,KLOR-CON) 20 MEQ tablet Take 20  mEq by mouth 2 (two) times daily.      Marland Kitchen warfarin (COUMADIN) 2 MG tablet Take 2-3 mg by mouth daily at 6 PM. Takes 1 tablets (3mg ) on Mondays and Fridays.  Takes 1 tablet (2mg ) on all remaining days.      . [DISCONTINUED] FLUoxetine (PROZAC) 20 MG capsule Take 20 mg by mouth daily.         No current facility-administered medications for this visit.    Allergies Amiodarone and Propylthiouracil  Family History: Family History  Problem Relation Age of Onset  . Colon cancer Neg Hx   . Thyroid disease Neg Hx   . Stroke Brother   . Hypertension Brother   . Kidney disease Brother   . Other Mother     Sepsis and perforated colon  . Other Father     Motor vehicle accident    Social History: History   Social History  . Marital Status: Widowed    Spouse Name: N/A    Number of Children: N/A  . Years of Education: N/A   Occupational History  . Not on file.   Social History Main Topics  . Smoking status: Former Smoker -- 1.00 packs/day for 50 years    Types: Cigarettes    Quit date: 07/13/1995  . Smokeless tobacco: Never Used     Comment: quit smoking around 1996  . Alcohol Use: No  . Drug Use: No  . Sexual Activity: No   Other Topics Concern  . Not on file   Social History Narrative   Lives in [place].    Electrocardiogram:  afib nonspecific IVCD   Assessment and Plan

## 2013-10-22 NOTE — Assessment & Plan Note (Signed)
Still with lots of GI issues Prune Juice helps ? reglan  Needs f/u with Dr Ardis Hughs

## 2013-10-22 NOTE — Patient Instructions (Addendum)
Your physician wants you to follow-up in:   3 MONTHS WITH  DR  Boynton   APPT  WITH  DR  Quillian Quince  JACOBS  ASAP PER  DR Johnsie Cancel You will receive a reminder letter in the mail two months in advance. If you don't receive a letter, please call our office to schedule the follow-up appointment. Your physician recommends that you continue on your current medications as directed. Please refer to the Current Medication list given to you today.

## 2013-10-22 NOTE — Assessment & Plan Note (Signed)
Stable with no angina and good activity level.  Continue medical Rx  

## 2013-10-27 ENCOUNTER — Emergency Department (HOSPITAL_COMMUNITY): Payer: Medicare Other

## 2013-10-27 ENCOUNTER — Emergency Department (HOSPITAL_COMMUNITY)
Admission: EM | Admit: 2013-10-27 | Discharge: 2013-10-27 | Disposition: A | Discharge status: Home / Self Care | Payer: Medicare Other | Attending: Emergency Medicine | Admitting: Emergency Medicine

## 2013-10-27 ENCOUNTER — Encounter (HOSPITAL_COMMUNITY): Payer: Self-pay | Admitting: Emergency Medicine

## 2013-10-27 DIAGNOSIS — Z79899 Other long term (current) drug therapy: Secondary | ICD-10-CM | POA: Insufficient documentation

## 2013-10-27 DIAGNOSIS — Z8614 Personal history of Methicillin resistant Staphylococcus aureus infection: Secondary | ICD-10-CM | POA: Insufficient documentation

## 2013-10-27 DIAGNOSIS — K219 Gastro-esophageal reflux disease without esophagitis: Secondary | ICD-10-CM | POA: Insufficient documentation

## 2013-10-27 DIAGNOSIS — Z7901 Long term (current) use of anticoagulants: Secondary | ICD-10-CM | POA: Insufficient documentation

## 2013-10-27 DIAGNOSIS — Z950 Presence of cardiac pacemaker: Secondary | ICD-10-CM | POA: Insufficient documentation

## 2013-10-27 DIAGNOSIS — G4700 Insomnia, unspecified: Secondary | ICD-10-CM | POA: Insufficient documentation

## 2013-10-27 DIAGNOSIS — Z9861 Coronary angioplasty status: Secondary | ICD-10-CM | POA: Insufficient documentation

## 2013-10-27 DIAGNOSIS — Z9889 Other specified postprocedural states: Secondary | ICD-10-CM | POA: Insufficient documentation

## 2013-10-27 DIAGNOSIS — Z87891 Personal history of nicotine dependence: Secondary | ICD-10-CM | POA: Insufficient documentation

## 2013-10-27 DIAGNOSIS — D509 Iron deficiency anemia, unspecified: Secondary | ICD-10-CM | POA: Insufficient documentation

## 2013-10-27 DIAGNOSIS — F411 Generalized anxiety disorder: Secondary | ICD-10-CM | POA: Insufficient documentation

## 2013-10-27 DIAGNOSIS — I4891 Unspecified atrial fibrillation: Secondary | ICD-10-CM | POA: Insufficient documentation

## 2013-10-27 DIAGNOSIS — Z8639 Personal history of other endocrine, nutritional and metabolic disease: Secondary | ICD-10-CM | POA: Insufficient documentation

## 2013-10-27 DIAGNOSIS — I5032 Chronic diastolic (congestive) heart failure: Secondary | ICD-10-CM | POA: Insufficient documentation

## 2013-10-27 DIAGNOSIS — I2510 Atherosclerotic heart disease of native coronary artery without angina pectoris: Secondary | ICD-10-CM | POA: Insufficient documentation

## 2013-10-27 DIAGNOSIS — F329 Major depressive disorder, single episode, unspecified: Secondary | ICD-10-CM | POA: Insufficient documentation

## 2013-10-27 DIAGNOSIS — R112 Nausea with vomiting, unspecified: Secondary | ICD-10-CM | POA: Insufficient documentation

## 2013-10-27 DIAGNOSIS — R609 Edema, unspecified: Secondary | ICD-10-CM | POA: Insufficient documentation

## 2013-10-27 DIAGNOSIS — Z862 Personal history of diseases of the blood and blood-forming organs and certain disorders involving the immune mechanism: Secondary | ICD-10-CM | POA: Insufficient documentation

## 2013-10-27 DIAGNOSIS — I129 Hypertensive chronic kidney disease with stage 1 through stage 4 chronic kidney disease, or unspecified chronic kidney disease: Secondary | ICD-10-CM | POA: Insufficient documentation

## 2013-10-27 DIAGNOSIS — G8929 Other chronic pain: Secondary | ICD-10-CM | POA: Insufficient documentation

## 2013-10-27 DIAGNOSIS — Z87442 Personal history of urinary calculi: Secondary | ICD-10-CM | POA: Insufficient documentation

## 2013-10-27 DIAGNOSIS — R011 Cardiac murmur, unspecified: Secondary | ICD-10-CM | POA: Insufficient documentation

## 2013-10-27 DIAGNOSIS — N189 Chronic kidney disease, unspecified: Secondary | ICD-10-CM | POA: Insufficient documentation

## 2013-10-27 DIAGNOSIS — F3289 Other specified depressive episodes: Secondary | ICD-10-CM | POA: Insufficient documentation

## 2013-10-27 DIAGNOSIS — I252 Old myocardial infarction: Secondary | ICD-10-CM | POA: Insufficient documentation

## 2013-10-27 DIAGNOSIS — G43909 Migraine, unspecified, not intractable, without status migrainosus: Secondary | ICD-10-CM | POA: Insufficient documentation

## 2013-10-27 LAB — COMPREHENSIVE METABOLIC PANEL
ALT: 11 U/L (ref 0–35)
AST: 16 U/L (ref 0–37)
Albumin: 3.4 g/dL — ABNORMAL LOW (ref 3.5–5.2)
Alkaline Phosphatase: 61 U/L (ref 39–117)
BUN: 48 mg/dL — ABNORMAL HIGH (ref 6–23)
CO2: 23 meq/L (ref 19–32)
CREATININE: 2.68 mg/dL — AB (ref 0.50–1.10)
Calcium: 9.7 mg/dL (ref 8.4–10.5)
Chloride: 100 mEq/L (ref 96–112)
GFR calc Af Amer: 19 mL/min — ABNORMAL LOW (ref >90)
GFR, EST NON AFRICAN AMERICAN: 16 mL/min — AB (ref >90)
Glucose, Bld: 100 mg/dL — ABNORMAL HIGH (ref 70–99)
Potassium: 4.6 mEq/L (ref 3.7–5.3)
Sodium: 140 mEq/L (ref 137–147)
Total Bilirubin: 1.3 mg/dL — ABNORMAL HIGH (ref 0.3–1.2)
Total Protein: 7.1 g/dL (ref 6.0–8.3)

## 2013-10-27 LAB — CBC WITH DIFFERENTIAL/PLATELET
BASOS ABS: 0 10*3/uL (ref 0.0–0.1)
BASOS PCT: 0 % (ref 0–1)
EOS PCT: 1 % (ref 0–5)
Eosinophils Absolute: 0.1 10*3/uL (ref 0.0–0.7)
HEMATOCRIT: 38.1 % (ref 36.0–46.0)
Hemoglobin: 12.7 g/dL (ref 12.0–15.0)
Lymphocytes Relative: 9 % — ABNORMAL LOW (ref 12–46)
Lymphs Abs: 0.7 10*3/uL (ref 0.7–4.0)
MCH: 29.8 pg (ref 26.0–34.0)
MCHC: 33.3 g/dL (ref 30.0–36.0)
MCV: 89.4 fL (ref 78.0–100.0)
MONO ABS: 0.6 10*3/uL (ref 0.1–1.0)
Monocytes Relative: 8 % (ref 3–12)
Neutro Abs: 5.7 10*3/uL (ref 1.7–7.7)
Neutrophils Relative %: 81 % — ABNORMAL HIGH (ref 43–77)
Platelets: 122 10*3/uL — ABNORMAL LOW (ref 150–400)
RBC: 4.26 MIL/uL (ref 3.87–5.11)
RDW: 17.9 % — AB (ref 11.5–15.5)
WBC: 7.1 10*3/uL (ref 4.0–10.5)

## 2013-10-27 LAB — APTT: aPTT: 44 seconds — ABNORMAL HIGH (ref 24–37)

## 2013-10-27 LAB — PROTIME-INR
INR: 2.16 — AB (ref 0.00–1.49)
PROTHROMBIN TIME: 23.4 s — AB (ref 11.6–15.2)

## 2013-10-27 LAB — I-STAT CG4 LACTIC ACID, ED: LACTIC ACID, VENOUS: 2.51 mmol/L — AB (ref 0.5–2.2)

## 2013-10-27 LAB — LIPASE, BLOOD: Lipase: 28 U/L (ref 11–59)

## 2013-10-27 MED ORDER — ONDANSETRON HCL 4 MG/2ML IJ SOLN
4.0000 mg | Freq: Once | INTRAMUSCULAR | Status: AC
  Administered 2013-10-27: 4 mg via INTRAVENOUS
  Filled 2013-10-27: qty 2

## 2013-10-27 MED ORDER — ONDANSETRON HCL 4 MG PO TABS: 4.0000 mg | ORAL_TABLET | Freq: Three times a day (TID) | ORAL | Status: DC | PRN

## 2013-10-27 NOTE — ED Provider Notes (Signed)
CSN: [id number]     Arrival date & time 10/27/13  1905 History   First MD Initiated Contact with Patient 10/27/13 1906     Chief Complaint  Patient presents with  . Abdominal Pain     (Consider location/radiation/quality/duration/timing/severity/associated sxs/prior Treatment) Patient is a 79 y.o. female presenting with abdominal pain.  Abdominal Pain  Pt with history of multiple medical problems as below reports she recently had RUE dialysis access placed by Dr. Trula Slade, she thinks it was a two weeks ago, but it was actually 2/6. She was also admitted a few days later for vomiting and Reglan which she thought was for constipation but also which made her stools so loose she could not make it to the bathroom. She states she stopped taking it a week ago, but it isn't clear that she is able to accurately estimate the number of days. She reports for the last 3 days she has not had any BM, no appetite, vomiting with any PO intake and dry heaves at times. No fever. Some diffuse crampy abdominal pain and distention.  Past Medical History  Diagnosis Date  . Cellulitis and abscess of unspecified site     a. h/o MSRA cellulitis of abdominal wall.  Marland Kitchen CAD (coronary artery disease)     a. DES to OM 01/2005. b. Rotational atherotomy/DES to prox LAD 02/2008. c. Cath 07/2013: stable, for med rx.  . Edema   . Warfarin anticoagulation     a. Followed by PCP.  Marland Kitchen GI bleed     a. EGD 01/2007: antral ulcers related to NSAID/aspirin use.  . Peripheral vascular disease   . Hyperthyroidism     a. Hx of intolerance to PTU.  Marland Kitchen Blood transfusion years ago    "heart problems" (09/26/2013)  . H/O hiatal hernia   . Umbilical hernia     unrepaired (11/24/11)  . Valvular heart disease     Echo 11/2011: mild-mod MR.  Marland Kitchen Hypertension     takes Amlodipine,Hydralazine,and Imdur daily  . Hyperlipidemia     takes Lovastatin daily  . SSS (sick sinus syndrome)     a. H/o AV node ablation 2008 secondary to difficult to  control afib/flutter, with subsequent pacer.  . Atrial fibrillation with rapid ventricular response     a. H/o amiodarone thyroiditis. b. s/p AV node ablation 2008 with implantation of PPM 01/2007, upgraded to CRT-P 06/2007.;takes Coumadin daily  . GERD (gastroesophageal reflux disease)     takes Protonix daily  . Chronic diastolic CHF (congestive heart failure)     takes Lasix daily and Spironolactone   . Pacemaker     Medtronic  . History of gout   . Bruises easily   . Hemorrhoids   . Depression     takes Xanax daily  . Insomnia     takes Xanax if needed  . Heart murmur   . Chronic renal insufficiency     Cr ~2.3.  . Polycystic kidney disease     a. Per remote E-Chart records.  . Kidney stones   . Myocardial infarction     "I've had 2-3; last one was in /1996" (09/26/2013)  . Exertional shortness of breath   . Migraines     "last one was a pretty good while ago" (09/26/2013)  . Arthritis     "all over" (09/26/2013)  . Chronic lower back pain     deteriorating disc  . Anxiety   . Iron deficiency anemia     takes an  Iron Pill daily   Past Surgical History  Procedure Laterality Date  . Esophagogastroduodenoscopy    . Tonsillectomy  ~ 1960  . Laminectomy and microdiscectomy lumbar spine  08/2003  . Cardioversion  10/2004; 02/2007    /E-chart  . Cholecystectomy    . Appendectomy  1954  . Vaginal hysterectomy  ?1981  . Av node ablation  02/2007    /E-chart  . Insert / replace / remove pacemaker  01/2007    initial placement  . Insert / replace / remove pacemaker  06/2007    upgraded/E-chart  . Av fistula placement, brachiocephalic Right 10/5571    "didn't work; my veins were too small"  . Joint replacement    . Total knee arthroplasty Left 01/2009  . Bladder and bowel  1970's    "tacked; damaged my intestines & had to remove some; done @ [facility]  . Cataract extraction w/ intraocular lens  implant, bilateral Bilateral   . Bascilic vein transposition Right 06/21/2013     Procedure: [place];  Surgeon: Serafina Mitchell, MD;  Location: [place] OR;  Service: Vascular;  Laterality: Right;  . Esophagogastroduodenoscopy N/A 07/19/2013    Procedure: ESOPHAGOGASTRODUODENOSCOPY (EGD);  Surgeon: Lafayette Dragon, MD;  Location: [facility] ENDOSCOPY;  Service: Endoscopy;  Laterality: N/A;  . Balloon dilation N/A 07/19/2013    Procedure: BALLOON DILATION;  Surgeon: Lafayette Dragon, MD;  Location: [facility] ENDOSCOPY;  Service: Endoscopy;  Laterality: N/A;  . Esophagogastroduodenoscopy N/A 07/20/2013    Procedure: ESOPHAGOGASTRODUODENOSCOPY (EGD);  Surgeon: Lafayette Dragon, MD;  Location: [facility] ENDOSCOPY;  Service: Endoscopy;  Laterality: N/A;  with removal of polyp in pylorus  . Bascilic vein transposition Right 09/20/2013    Procedure: 2ND STAGE RIGHT BASCILIC VEIN TRANSPOSITION; ULTRSOUND GUIDED;  Surgeon: Serafina Mitchell, MD;  Location: [facility] OR;  Service: Vascular;  Laterality: Right;  . Cardiac catheterization  2006; 2008; 2009; 01/13/2011  . Coronary angioplasty with stent placement  ~ 2007; ?    "had a total of 2 stents put in" 92/07/2014)  . Back surgery     Family History  Problem Relation Age of Onset  . Colon cancer Neg Hx   . Thyroid disease Neg Hx   . Stroke Brother   . Hypertension Brother   . Kidney disease Brother   . Other Mother     Sepsis and perforated colon  . Other Father     Motor vehicle accident   History  Substance Use Topics  . Smoking status: Former Smoker -- 1.00 packs/day for 50 years    Types: Cigarettes    Quit date: 07/13/1995  . Smokeless tobacco: Never Used     Comment: quit smoking around 1996  . Alcohol Use: No   OB History   Grav Para Term Preterm Abortions TAB SAB Ect Mult Living                 Review of Systems  Gastrointestinal: Positive for abdominal pain.   All other systems reviewed and are negative except as noted in HPI.     Allergies  Amiodarone and Propylthiouracil  Home Medications   Current Outpatient Rx   Name  Route  Sig  Dispense  Refill  . ALPRAZolam (XANAX) 0.5 MG tablet   Oral   Take 0.5 mg by mouth 3 (three) times daily as needed for anxiety.          . ferrous sulfate 325 (65 FE) MG tablet   Oral  Take 325 mg by mouth daily with breakfast.          . furosemide (LASIX) 40 MG tablet   Oral   Take 1 tablet (40 mg total) by mouth 2 (two) times daily.   90 tablet   3   . hydrALAZINE (APRESOLINE) 25 MG tablet   Oral   Take 25 mg by mouth 3 (three) times daily.         . isosorbide mononitrate (IMDUR) 60 MG 24 hr tablet   Oral   Take 60 mg by mouth 2 (two) times daily.         Marland Kitchen lovastatin (MEVACOR) 10 MG tablet   Oral   Take 10 mg by mouth at bedtime.          . magnesium oxide (MAG-OX) 400 MG tablet   Oral   Take 400 mg by mouth at bedtime.          . metoCLOPramide (REGLAN) 5 MG tablet   Oral   Take 1 tablet (5 mg total) by mouth 3 (three) times daily before meals.   30 tablet   0   . metoprolol tartrate (LOPRESSOR) 25 MG tablet   Oral   Take 1 tablet (25 mg total) by mouth 2 (two) times daily.   60 tablet   0   . oxyCODONE-acetaminophen (PERCOCET/ROXICET) 5-325 MG per tablet   Oral   Take 1 tablet by mouth 3 (three) times daily as needed for moderate pain. For pain   30 tablet   0   . pantoprazole (PROTONIX) 40 MG tablet   Oral   Take 40 mg by mouth 2 (two) times daily.          . paricalcitol (ZEMPLAR) 1 MCG capsule   Oral   Take 1 mcg by mouth at bedtime.         . potassium chloride SA (K-DUR,KLOR-CON) 20 MEQ tablet   Oral   Take 20 mEq by mouth 2 (two) times daily.         Marland Kitchen warfarin (COUMADIN) 2 MG tablet   Oral   Take 2-3 mg by mouth daily at 6 PM. Takes 1 tablets (3mg ) on Mondays and Fridays.  Takes 1 tablet (2mg ) on all remaining days.         . nitroGLYCERIN (NITROSTAT) 0.4 MG SL tablet   Sublingual   Place 0.4 mg under the tongue every 5 (five) minutes as needed for chest pain.          SpO2 98% Physical  Exam  Nursing note and vitals reviewed. Constitutional: She is oriented to person, place, and time. She appears well-developed and well-nourished.  HENT:  Head: Normocephalic and atraumatic.  Eyes: EOM are normal. Pupils are equal, round, and reactive to light.  Neck: Normal range of motion. Neck supple.  Cardiovascular: Normal rate, normal heart sounds and intact distal pulses.   Pulmonary/Chest: Effort normal and breath sounds normal.  Abdominal: She exhibits distension. There is tenderness. There is no rebound and no guarding.  High pitched bowel sounds  Musculoskeletal: Normal range of motion. She exhibits no edema and no tenderness.  Neurological: She is alert and oriented to person, place, and time. She has normal strength. No cranial nerve deficit or sensory deficit.  Skin: Skin is warm and dry. No rash noted.  Psychiatric: She has a normal mood and affect.    ED Course  Procedures (including critical care time) Labs Review Labs Reviewed  CBC WITH DIFFERENTIAL -  Abnormal; Notable for the following:    RDW 17.9 (*)    Platelets 122 (*)    Neutrophils Relative % 81 (*)    Lymphocytes Relative 9 (*)    All other components within normal limits  COMPREHENSIVE METABOLIC PANEL - Abnormal; Notable for the following:    Glucose, Bld 100 (*)    BUN 48 (*)    Creatinine, Ser 2.68 (*)    Albumin 3.4 (*)    Total Bilirubin 1.3 (*)    GFR calc non Af Amer 16 (*)    GFR calc Af Amer 19 (*)    All other components within normal limits  PROTIME-INR - Abnormal; Notable for the following:    Prothrombin Time 23.4 (*)    INR 2.16 (*)    All other components within normal limits  APTT - Abnormal; Notable for the following:    aPTT 44 (*)    All other components within normal limits  I-STAT CG4 LACTIC ACID, ED - Abnormal; Notable for the following:    Lactic Acid, Venous 2.51 (*)    All other components within normal limits  LIPASE, BLOOD   Imaging Review Dg Abd Acute  W/chest  10/27/2013   CLINICAL DATA:  Abdominal distention. Vomiting. Cough and chest congestion.  EXAM: ACUTE ABDOMEN SERIES (ABDOMEN 2 VIEW & CHEST 1 VIEW)  COMPARISON:  Chest x-rays dated 09/26/2013 and 03/03/2009 and abdominal radiographs dated 03/03/2009  FINDINGS: Pacemaker in place. Borderline cardiomegaly. Slight pulmonary vascular prominence.  There is no free air in the abdomen. No dilated loops of large or small bowel. Previous cholecystectomy. Multiple surgical clips in the pelvis. Rotoscoliosis of the lumbar spine.  IMPRESSION: Benign appearing abdomen. Cardiomegaly with slight pulmonary vascular congestion.   Electronically Signed   By: Rozetta Nunnery M.D.   On: 10/27/2013 21:05     EKG Interpretation None      MDM   Final diagnoses:  Nausea & vomiting    Pt feeling much better after zofran and IVF. AAS neg for signs of SBO. Labs otherwise unremarkable. Son at bedside now confirms this has been an ongoing issue for several weeks. She has GI appointment already scheduled for tomorrow.     Briant Angelillo B. Karle Starch, MD 10/27/13 2159

## 2013-10-27 NOTE — ED Notes (Signed)
Dr Karle Starch given a copy of lactic acid results 2.51

## 2013-10-27 NOTE — ED Notes (Signed)
Attempted to draw pt labwork x2, unsuccessful.

## 2013-10-27 NOTE — ED Notes (Signed)
Phlebotomy at bedside.

## 2013-10-27 NOTE — ED Notes (Signed)
Pt arrives from home via ems for evaluation of n/v and constipation x3 days. EMS states pt was seen last week for the same and given laxative prescription, pt states medicine gave her diarrhea so she stopped taking the meds. Pt reports constipation ever since with occasional nausea. Pt denies any pain, EMS states pt abdomen is distended. EKG done, pt has pacemaker.

## 2013-10-27 NOTE — Discharge Instructions (Signed)

## 2013-10-28 ENCOUNTER — Telehealth: Payer: Self-pay | Admitting: Cardiovascular Disease

## 2013-10-28 ENCOUNTER — Ambulatory Visit (INDEPENDENT_AMBULATORY_CARE_PROVIDER_SITE_OTHER): Payer: Medicare Other | Admitting: Physician Assistant

## 2013-10-28 ENCOUNTER — Encounter: Payer: Self-pay | Admitting: Physician Assistant

## 2013-10-28 ENCOUNTER — Other Ambulatory Visit (INDEPENDENT_AMBULATORY_CARE_PROVIDER_SITE_OTHER): Payer: Medicare Other

## 2013-10-28 VITALS — BP 132/68 | HR 64 | Ht 67.0 in | Wt 211.0 lb

## 2013-10-28 DIAGNOSIS — R140 Abdominal distension (gaseous): Secondary | ICD-10-CM

## 2013-10-28 DIAGNOSIS — R141 Gas pain: Secondary | ICD-10-CM

## 2013-10-28 DIAGNOSIS — R142 Eructation: Secondary | ICD-10-CM

## 2013-10-28 DIAGNOSIS — R160 Hepatomegaly, not elsewhere classified: Secondary | ICD-10-CM

## 2013-10-28 DIAGNOSIS — R0989 Other specified symptoms and signs involving the circulatory and respiratory systems: Secondary | ICD-10-CM

## 2013-10-28 DIAGNOSIS — R110 Nausea: Secondary | ICD-10-CM

## 2013-10-28 DIAGNOSIS — R143 Flatulence: Secondary | ICD-10-CM

## 2013-10-28 DIAGNOSIS — R0609 Other forms of dyspnea: Secondary | ICD-10-CM

## 2013-10-28 LAB — BRAIN NATRIURETIC PEPTIDE: Pro B Natriuretic peptide (BNP): 1368 pg/mL — ABNORMAL HIGH (ref 0.0–100.0)

## 2013-10-28 MED ORDER — ONDANSETRON HCL 4 MG PO TABS: 4.0000 mg | ORAL_TABLET | Freq: Three times a day (TID) | ORAL | Status: DC | PRN

## 2013-10-28 NOTE — Telephone Encounter (Signed)
New message    Amy Princeton PA at [facility] want this pt seen this week with Dr Johnsie Cancel or a PA/NP.  Pt has SOB and can't sleep lying down.  She is having a CT on the 18th for nausea/liver enlargement. Can you work her in?  Pls call pt with the appt unless you have a problem--call Pam

## 2013-10-28 NOTE — Telephone Encounter (Signed)
LEFT MESSAGE FOR PT T O CALL  BACK  AT THIS TIME  THERE  IS   APPOINTMENT  AVAILABLE  WITH  MICHELE LENZE PAC  ON  10-30-13 AT  12:45 PM

## 2013-10-28 NOTE — Patient Instructions (Addendum)
Please go to the basement level to have your labs drawn.  We sent refills on the zofran 78m [facility]. Stay on Protonix 40 mg twice daily. Miralax 17 gram, 8 oz water or juice, 1/2 to 1 dose daily.  Stop Iron.   You have been scheduled for a CT [facility] of the abdomen and [facility] at [facility]([id number]this is in the same building as [facility].   You are scheduled on 10-30-2013 at 2:30 PM. You should arrive 15 minutes prior to your appointment time for registration. Please follow the written instructions below on the day of your exam:  WARNING: IF YOU ARE ALLERGIC TO IODINE/X-RAY DYE, PLEASE NOTIFY RADIOLOGY IMMEDIATELY AT [phone] YOU WILL BE GIVEN A 13 HOUR PREMEDICATION PREP.   You have been given 2 bottles of oral contrast to drink. The solution may taste better if refrigerated, but do NOT add ice or any other liquid to this solution. Shake well before drinking.    Drink 1 bottle of contrast @ 1:30 PM  (2 hours prior to your exam)  Drink 1 bottle of contrast @ 2:30 PM  (1 hour prior to your exam)  You may take any medications as prescribed with a small amount of water except for the following: Metformin, Glucophage, Glucovance, Avandamet, Riomet, Fortamet, Actoplus Met, Janumet, Glumetza or Metaglip. The above medications must be held the day of the exam AND 48 hours after the exam.  The purpose of you drinking the oral contrast is to aid in the visualization of your intestinal tract. The contrast solution may cause some diarrhea. Before your exam is started, you will be given a small amount of fluid to drink. Depending on your individual set of symptoms, you may also receive an intravenous injection of x-ray contrast/dye. [facility] or [facility], depending on the type of exam you are having performed.  If you have any questions regarding your exam or if you need to reschedule, you may call the CT  department at [phone] the hours of 8:00 am and 5:00 pm, Monday-Friday.  ________________________________________________________________________

## 2013-10-28 NOTE — Progress Notes (Signed)
Subjective:    Patient ID: [name], female    DOB: [birth year], 79 y.o.   MRN: [id number]  HPI [name] is a pleasant 79 year old white female known previously to Dr. Ardis Hughs. She has multiple medical problems and has had 2 admissions within the past couple of months. She has coronary artery disease status post stenting and is maintained on anticoagulation. She also has history of atrial fibrillation congestive heart failure chronic kidney disease stage IV. She just had a shunt placed. He has history of GI bleeding in 2008 from NSAID-induced gastropathy and is status post cholecystectomy. She had the EGD in December of 2014 when she was hospitalized with complaints of nausea found to have a large prepyloric polyp which was removed by Dr. Olevia Perches. She was also admitted in February 2014 with congestive heart failure and also had complaints of nausea at that time. She is being considered for dialysis, and followed by Dr. Arty Baumgartner.Patient had an ER visit last evening due to abdominal discomfort and nausea. Today she complains of abdominal discomfort due to distention. She says her abdomen is large amount comfortable and she feels very bloated. She has been nauseated intermittently for the past few months but says the nausea is becoming more constant over the past few weeks. She has had some episodes of vomiting but not on a daily basis. She has no appetite and has not been eating much. She also says she's been having significant problems with constipation since her last admission area She also mentions that she has been gaining weight despite being on diuretics, is short of breath with any activity and is unable to sleep lying down or even propped up due to increased problems with shortness of breath she says she's been wheezing off and on at home. She's not currently on any NSAIDs. She was given a prescription for Zofran in the emergency room last night but they have not feel that as yet.  Labs are  reviewed from the ER visit show a BUN of 48 creatinine of 2.68 LFTs are normal with the exception of a total bili of 1.3 WBC was 7.1 hemoglobin 12.7 hematocrit of 38.1 lipase 28 INR 2.  plain abdominal films which were negative, and 1 view chest x-ray which did show  pulmonary vascular congestion.   Review of Systems  Constitutional: Positive for activity change, appetite change, fatigue and unexpected weight change.  HENT: Negative.   Eyes: Negative.   Respiratory: Positive for cough, shortness of breath and wheezing.   Cardiovascular: Positive for leg swelling.  Gastrointestinal: Positive for nausea, vomiting, constipation and abdominal distention.  Endocrine: Negative.   Genitourinary: Negative.   Musculoskeletal: Negative.   Allergic/Immunologic: Negative.   Neurological: Negative.   Hematological: Negative.   Psychiatric/Behavioral: Negative.    Outpatient Encounter Prescriptions as of 10/28/2013  Medication Sig  . ALPRAZolam (XANAX) 0.5 MG tablet Take 0.5 mg by mouth 3 (three) times daily as needed for anxiety.   . ferrous sulfate 325 (65 FE) MG tablet Take 325 mg by mouth daily with breakfast.   . furosemide (LASIX) 40 MG tablet Take 1 tablet (40 mg total) by mouth 2 (two) times daily.  . hydrALAZINE (APRESOLINE) 25 MG tablet Take 25 mg by mouth 3 (three) times daily.  . isosorbide mononitrate (IMDUR) 60 MG 24 hr tablet Take 60 mg by mouth 2 (two) times daily.  Marland Kitchen lovastatin (MEVACOR) 10 MG tablet Take 10 mg by mouth at bedtime.   . magnesium oxide (MAG-OX) 400 MG  tablet Take 400 mg by mouth at bedtime.   . metoCLOPramide (REGLAN) 5 MG tablet Take 1 tablet (5 mg total) by mouth 3 (three) times daily before meals.  . metoprolol tartrate (LOPRESSOR) 25 MG tablet Take 1 tablet (25 mg total) by mouth 2 (two) times daily.  . nitroGLYCERIN (NITROSTAT) 0.4 MG SL tablet Place 0.4 mg under the tongue every 5 (five) minutes as needed for chest pain.  Marland Kitchen oxyCODONE-acetaminophen  (PERCOCET/ROXICET) 5-325 MG per tablet Take 1 tablet by mouth 3 (three) times daily as needed for moderate pain. For pain  . pantoprazole (PROTONIX) 40 MG tablet Take 40 mg by mouth 2 (two) times daily.   . paricalcitol (ZEMPLAR) 1 MCG capsule Take 1 mcg by mouth at bedtime.  . potassium chloride SA (K-DUR,KLOR-CON) 20 MEQ tablet Take 20 mEq by mouth 2 (two) times daily.  Marland Kitchen warfarin (COUMADIN) 2 MG tablet Take 2-3 mg by mouth daily at 6 PM. Takes 1 tablets (3mg ) on Mondays and Fridays.  Takes 1 tablet (2mg ) on all remaining days.  . ondansetron (ZOFRAN) 4 MG tablet Take 1 tablet (4 mg total) by mouth every 8 (eight) hours as needed for nausea or vomiting.  . [DISCONTINUED] ondansetron (ZOFRAN) 4 MG tablet Take 1 tablet (4 mg total) by mouth every 8 (eight) hours as needed for nausea or vomiting.   Allergies  Allergen Reactions  . Amiodarone Hives  . Propylthiouracil Rash   Patient Active Problem List   Diagnosis Date Noted  . Nausea alone 09/28/2013  . AKI (acute kidney injury) 09/26/2013  . Gastric polyp 07/21/2013  . Hypokalemia 07/20/2013  . Benign neoplasm of stomach 07/20/2013  . Dysphagia 07/18/2013  . Chest pain 07/14/2013  . CKD (chronic kidney disease) stage 4, GFR 15-29 ml/min 07/09/2013  . HLD (hyperlipidemia) 07/09/2013  . End stage renal disease 06/10/2013  . A-fib 07/26/2012  . Rash 07/26/2012  . Acute on chronic diastolic heart failure 69/62/9528  . Chest pain 11/25/2011  . Acute-on-chronic kidney injury 11/25/2011  . Cellulitis of abdominal wall 10/29/2011  . CAD, NATIVE VESSEL 05/05/2010  . BLOOD IN STOOL-MELENA 12/10/2009  . DIARRHEA 11/24/2009  . NONSPECIFIC ABNORMAL FINDING IN STOOL CONTENTS 08/03/2009  . ANEMIA 12/25/2008  . CAD, UNSPECIFIED SITE 12/25/2008  . BRADYCARDIA 12/25/2008  . CELLULITIS 12/25/2008  . EDEMA 12/25/2008  . PACEMAKER-Medtronic 12/25/2008  . HYPERTHYROIDISM 08/07/2007  . DEPRESSION 05/14/2007  . HYPERTENSION 05/14/2007  .  [place]-induced atrial fibrillation 05/14/2007  . Congestive heart failure, unspecified 05/14/2007  . GERD 05/14/2007  . RENAL INSUFFICIENCY 05/14/2007  . GASTROINTESTINAL HEMORRHAGE, HX OF 05/14/2007   History  Substance Use Topics  . Smoking status: Former Smoker -- 1.00 packs/day for 50 years    Types: Cigarettes    Quit date: 07/13/1995  . Smokeless tobacco: Never Used     Comment: quit smoking around 1996  . Alcohol Use: No      family history includes Hypertension in her brother; Kidney disease in her brother; Other in her father and mother; Stroke in her brother. There is no history of Colon cancer or Thyroid disease.  Objective:   Physical Exam well-developed elderly white female in no acute distress accompanied by her son blood pressure 132/68 pulse 64 height 5 foot 7 weight 211. HEENT; nontraumatic normocephalic EOMI PERRLA sclera anicteric, Supple; no JVD, Cardiovascula;r regular rate and rhythm with U1-L2 soft systolic murmur, Pulmonary; decreased breath sounds bases no active wheezing, Abdomen; large protuberant nontender her abdomen is very full in the  upper abdomen and liver is enlarged palpable at least 4 fingerbreadths below the right costal margin, Rectal; exam stool Hemoccult negative no impaction or significant stool in the rectal vault, Extremities ;2+ edema to the knees, Psych ;mood and affect appropriate        Assessment & Plan:  #62 79 year old female with persistent nausea x2 weeks and intermittent nausea x2 months. I suspect her nausea is secondary to multisystem disease with progressive kidney failure and congestive heart failure, rather than underlying GI pathology. Patient has had progressive dyspnea and orthopnea #2 abdominal distention-patient's liver is quite large and may be symptomatic will also need to rule out ascites #3 constipation likely medication induced #4 history of prepyloric gastric polyp removed-path  consistent with benign gastric polyp #5  coronary artery disease status post stent  #6 history of bradycardia status post pacemaker #7 congestive heart failure #8 atrial fibrillation #9 GERD  Plan; start Zofran 4 mg every 6-8 hours as needed patient encouraged to take this regularly so she can increase her by mouth intake Continue Protonix 40 mg twice daily Patient has been on Reglan 5 mg 3 times daily we'll continue for now Schedule for CT scan of the abdomen and pelvis without IV contrast Start MiraLax one half to one dose daily Stopped oral iron for now Check BNP Will make patient a followup appointment with cardiology for later this week as concerned that her congestive heart failure is worsening again with  volume overload

## 2013-10-29 NOTE — Progress Notes (Signed)
i agree with the above note

## 2013-10-29 NOTE — Telephone Encounter (Signed)
PT'S  SON  AWARE  OF APPT  WITH  SCOTT WEAVER  [facility]    ON  11-01-13 AT  9:50 AM./CY

## 2013-10-29 NOTE — Telephone Encounter (Signed)
Follow up  ° ° ° °Returning call back to nurse  °

## 2013-10-30 ENCOUNTER — Ambulatory Visit (INDEPENDENT_AMBULATORY_CARE_PROVIDER_SITE_OTHER)
Admission: RE | Admit: 2013-10-30 | Discharge: 2013-10-30 | Disposition: A | Discharge status: Home / Self Care | Payer: Medicare Other | Source: Ambulatory Visit | Attending: Physician Assistant | Admitting: Physician Assistant

## 2013-10-30 DIAGNOSIS — R142 Eructation: Secondary | ICD-10-CM

## 2013-10-30 DIAGNOSIS — R143 Flatulence: Secondary | ICD-10-CM

## 2013-10-30 DIAGNOSIS — R141 Gas pain: Secondary | ICD-10-CM

## 2013-10-30 DIAGNOSIS — R160 Hepatomegaly, not elsewhere classified: Secondary | ICD-10-CM

## 2013-10-30 DIAGNOSIS — R140 Abdominal distension (gaseous): Secondary | ICD-10-CM

## 2013-10-30 DIAGNOSIS — R110 Nausea: Secondary | ICD-10-CM

## 2013-11-01 ENCOUNTER — Ambulatory Visit (INDEPENDENT_AMBULATORY_CARE_PROVIDER_SITE_OTHER): Payer: Medicare Other | Admitting: Physician Assistant

## 2013-11-01 ENCOUNTER — Encounter: Payer: Self-pay | Admitting: Surgery

## 2013-11-01 ENCOUNTER — Encounter: Payer: Self-pay | Admitting: Physician Assistant

## 2013-11-01 VITALS — BP 130/60 | HR 61 | Ht 67.0 in | Wt 208.0 lb

## 2013-11-01 DIAGNOSIS — I10 Essential (primary) hypertension: Secondary | ICD-10-CM

## 2013-11-01 DIAGNOSIS — I2510 Atherosclerotic heart disease of native coronary artery without angina pectoris: Secondary | ICD-10-CM

## 2013-11-01 DIAGNOSIS — J189 Pneumonia, unspecified organism: Secondary | ICD-10-CM

## 2013-11-01 DIAGNOSIS — I5033 Acute on chronic diastolic (congestive) heart failure: Secondary | ICD-10-CM

## 2013-11-01 DIAGNOSIS — N184 Chronic kidney disease, stage 4 (severe): Secondary | ICD-10-CM

## 2013-11-01 DIAGNOSIS — I4891 Unspecified atrial fibrillation: Secondary | ICD-10-CM

## 2013-11-01 DIAGNOSIS — R9389 Abnormal findings on diagnostic imaging of other specified body structures: Secondary | ICD-10-CM

## 2013-11-01 MED ORDER — FUROSEMIDE 40 MG PO TABS: ORAL_TABLET | ORAL | Status: DC

## 2013-11-01 MED ORDER — METOLAZONE 2.5 MG PO TABS: ORAL_TABLET | ORAL | Status: DC

## 2013-11-01 MED ORDER — MOXIFLOXACIN HCL 400 MG PO TABS: 400.0000 mg | ORAL_TABLET | Freq: Every day | ORAL | Status: DC

## 2013-11-01 MED ORDER — POTASSIUM CHLORIDE CRYS ER 20 MEQ PO TBCR: 20.0000 meq | EXTENDED_RELEASE_TABLET | ORAL | Status: DC

## 2013-11-01 NOTE — Progress Notes (Signed)
[address] [place], [place]  [address] Phone: [phone] Fax:  [phone]  Date:  11/01/2013   ID:  [name], DOB September 24, 1934, MRN [id number]  PCP:  Tamsen Roers, MD  Cardiologist:  Dr. Jenkins Rouge   Electrophysiologist:  Dr. Cristopher Peru  Nephrologist: Dr. Marval Regal   History of Present Illness: [name] is a 79 y.o. female with a hx of CAD, s/p DES to the OM in 6/06, rotational atherectomy + DES to pLAD in 7/09, symptomatic bradycardia s/p pacemaker with upgrade to CRT-P 11/08, AFib s/p AVN ablation (prior amiodarone thyroiditis), chronic diastolic CHF, mitral regurgitation, CKD stage IV, HTN, HL, GERD, prior UGI bleed in 2008 related to NSAID use, PVD.  She has had a difficult time with access for hemodialysis. She had a brachial vein transposition in 09/2013.  She was seen in 06/2013 with worsening chest pain consistent with CCS class III angina. She was ultimately set up for cardiac catheterization that demonstrated patent stent in the LAD and obtuse marginal 1 with nonobstructive disease elsewhere. Medical therapy was continued.  She was admitted in 07/2013 with progressive dysphagia. EGD demonstrated large prepyloric ulcer and polyp which was removed. She had esophageal dilatation.  Admitted in 09/2013 with acute on chronic renal failure in the setting of nausea, vomiting and anorexia with poor by mouth intake. Last seen by Dr. Johnsie Cancel 10/2013.    She was recently seen by gastroenterology with persistent nausea as well as abdominal bloating, progressive dyspnea and orthopnea.  Abdominal CT demonstrated groundglass opacity in the right middle lobe suspicious for pneumonia with trace effusion as well as nodular opacity in the left lung base suspicious for an infectious/inflammatory process, scattered hepatic cysts measuring up to 17 mm, findings consistent with polycystic kidney disease. No ascites was noted.  Liver size was normal.  BNP remained elevated  at 1368.    She is here with her son today. She has had significant cough over the last couple of weeks. She describes yellow sputum. She notes poor appetite. She sometimes coughs hard enough to vomit. She also has vomiting with certain meals. She denies hemoptysis. She denies significant fever. She feels weak. She notes increased shortness of breath. She probably describes NYHA class III symptoms. She denies orthopnea. She has noted PND as well as edema. She notes increased abdominal girth. She took an extra metolazone 2 days ago with some improvement in her symptoms. She denies any chest pain or syncope.   Studies: - Myoview (01/2009): EF 79%, no scar or ischemia.   - Echo (03/2013):  Mild focal basal septal hypertrophy, EF 55-60%, mild MR, moderate to severe LAE, mild to moderate RVE, reduced RVSF, moderate RAE.   - LHC (07/14/13):  pLAD stent ok, LAD 30-40, ostial first septal perf 80, OM1 stent ok with 20 ISR, AVCFX 20-30, prox to mid RCA 30-40.  Med Rx.    Recent Labs: 10/27/2013: ALT 11; Creatinine 2.68*; Hemoglobin 12.7; Potassium 4.6  10/28/2013: Pro B Natriuretic peptide (BNP) 1368.0* 10/27/2013: POC INR 2.16*; WBC 7.1   Abdominal CT (10/2013): IMPRESSION: Scattered hepatic cysts measuring up to 17 mm. Liver is otherwise unremarkable on unenhanced CT. 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 Mild patchy/ground-glass opacity in the right middle lobe, suspicious for pneumonia. Trace right pleural effusion. 11 x 7 mm patchy/nodular opacity at the left lung base, possibly infectious/inflammatory, although indeterminate. Consider follow-up CT chest in 3 months to assess for persistence.  Wt Readings from Last 3 Encounters:  11/01/13 208 lb (94.348 kg)  10/28/13 211 lb (95.709 kg)  10/22/13 211 lb (95.709 kg)     Past Medical History  Diagnosis Date  . Cellulitis and abscess of unspecified site     a. h/o MSRA  cellulitis of abdominal wall.  Marland Kitchen CAD (coronary artery disease)     a. DES to OM 01/2005. b. Rotational atherotomy/DES to prox LAD 02/2008. c. Cath 07/2013: stable, for med rx.  . Edema   . Warfarin anticoagulation     a. Followed by PCP.  Marland Kitchen GI bleed     a. EGD 01/2007: antral ulcers related to NSAID/aspirin use.  . Peripheral vascular disease   . Hyperthyroidism     a. Hx of intolerance to PTU.  Marland Kitchen Blood transfusion years ago    "heart problems" (09/26/2013)  . H/O hiatal hernia   . Umbilical hernia     unrepaired (11/24/11)  . Valvular heart disease     Echo 11/2011: mild-mod MR.  Marland Kitchen Hypertension     takes Amlodipine,Hydralazine,and Imdur daily  . Hyperlipidemia     takes Lovastatin daily  . SSS (sick sinus syndrome)     a. H/o AV node ablation 2008 secondary to difficult to control afib/flutter, with subsequent pacer.  . Atrial fibrillation with rapid ventricular response     a. H/o amiodarone thyroiditis. b. s/p AV node ablation 2008 with implantation of PPM 01/2007, upgraded to CRT-P 06/2007.;takes Coumadin daily  . GERD (gastroesophageal reflux disease)     takes Protonix daily  . Chronic diastolic CHF (congestive heart failure)     takes Lasix daily and Spironolactone   . Pacemaker     [employer]tronic  . History of gout   . Bruises easily   . Hemorrhoids   . Depression     takes Xanax daily  . Insomnia     takes Xanax if needed  . Heart murmur   . Chronic renal insufficiency     Cr ~2.3.  . Polycystic kidney disease     a. Per remote E-Chart records.  . Kidney stones   . Myocardial infarction     "I've had 2-3; last one was in /1996" (09/26/2013)  . Exertional shortness of breath   . Migraines     "last one was a pretty good while ago" (09/26/2013)  . Arthritis     "all over" (09/26/2013)  . Chronic lower back pain     deteriorating disc  . Anxiety   . Iron deficiency anemia     takes an Iron Pill daily   Past Surgical History  Procedure Laterality Date  .  Esophagogastroduodenoscopy    . Tonsillectomy  ~ 1960  . Laminectomy and microdiscectomy lumbar spine  08/2003  . Cardioversion  10/2004; 02/2007    /E-chart  . Cholecystectomy    . Appendectomy  1954  . Vaginal hysterectomy  ?1981  . Av node ablation  02/2007    /E-chart  . Insert / replace / remove pacemaker  01/2007    initial placement  . Insert / replace / remove pacemaker  06/2007    upgraded/E-chart  . Av fistula placement, brachiocephalic Right 0/9604    "didn't work; my veins were too small"  . Joint replacement    . Total knee arthroplasty Left 01/2009  . Bladder and bowel  1970's    "tacked; damaged my intestines & had to remove some; done @ [facility]  . Cataract extraction w/  intraocular lens  implant, bilateral Bilateral   . Bascilic vein transposition Right 06/21/2013    Procedure: [place];  Surgeon: Serafina Mitchell, MD;  Location: [facility] OR;  Service: Vascular;  Laterality: Right;  . Esophagogastroduodenoscopy N/A 07/19/2013    Procedure: ESOPHAGOGASTRODUODENOSCOPY (EGD);  Surgeon: Lafayette Dragon, MD;  Location[facility] ENDOSCOPY;  Service: Endoscopy;  Laterality: N/A;  . Balloon dilation N/A 07/19/2013    Procedure: BALLOON DILATION;  Surgeon: Lafayette Dragon, MD;  Location: [facility] ENDOSCOPY;  Service: Endoscopy;  Laterality: N/A;  . Esophagogastroduodenoscopy N/A 07/20/2013    Procedure: ESOPHAGOGASTRODUODENOSCOPY (EGD);  Surgeon: Lafayette Dragon, MD;  Location: [facility] ENDOSCOPY;  Service: Endoscopy;  Laterality: N/A;  with removal of polyp in pylorus  . Bascilic vein transposition Right 09/20/2013    Procedure: 2ND STAGE RIGHT BASCILIC VEIN TRANSPOSITION; ULTRSOUND GUIDED;  Surgeon: Serafina Mitchell, MD;  Location: [facility] OR;  Service: Vascular;  Laterality: Right;  . Cardiac catheterization  2006; 2008; 2009; 01/13/2011  . Coronary angioplasty with stent placement  ~ 2007; ?    "had a total of 2 stents put in" 92/07/2014)  . Back surgery       Current Outpatient  Prescriptions  Medication Sig Dispense Refill  . ALPRAZolam (XANAX) 0.5 MG tablet Take 0.5 mg by mouth 3 (three) times daily as needed for anxiety.       . furosemide (LASIX) 40 MG tablet Take 1 tablet (40 mg total) by mouth 2 (two) times daily.  90 tablet  3  . hydrALAZINE (APRESOLINE) 25 MG tablet Take 25 mg by mouth 3 (three) times daily.      . isosorbide mononitrate (IMDUR) 60 MG 24 hr tablet Take 60 mg by mouth 2 (two) times daily.      Marland Kitchen lovastatin (MEVACOR) 10 MG tablet Take 10 mg by mouth at bedtime.       . magnesium oxide (MAG-OX) 400 MG tablet Take 400 mg by mouth at bedtime.       . metoCLOPramide (REGLAN) 5 MG tablet Take 1 tablet (5 mg total) by mouth 3 (three) times daily before meals.  30 tablet  0  . metoprolol tartrate (LOPRESSOR) 25 MG tablet Take 1 tablet (25 mg total) by mouth 2 (two) times daily.  60 tablet  0  . nitroGLYCERIN (NITROSTAT) 0.4 MG SL tablet Place 0.4 mg under the tongue every 5 (five) minutes as needed for chest pain.      Marland Kitchen ondansetron (ZOFRAN) 4 MG tablet Take 1 tablet (4 mg total) by mouth every 8 (eight) hours as needed for nausea or vomiting.  40 tablet  2  . oxyCODONE-acetaminophen (PERCOCET/ROXICET) 5-325 MG per tablet Take 1 tablet by mouth 3 (three) times daily as needed for moderate pain. For pain  30 tablet  0  . pantoprazole (PROTONIX) 40 MG tablet Take 40 mg by mouth 2 (two) times daily.       . paricalcitol (ZEMPLAR) 1 MCG capsule Take 1 mcg by mouth at bedtime.      . potassium chloride SA (K-DUR,KLOR-CON) 20 MEQ tablet Take 20 mEq by mouth 2 (two) times daily.      Marland Kitchen warfarin (COUMADIN) 2 MG tablet Take 2-3 mg by mouth daily at 6 PM. Takes 1 tablets (3mg ) on Mondays and Fridays.  Takes 1 tablet (2mg ) on all remaining days.      . [DISCONTINUED] FLUoxetine (PROZAC) 20 MG capsule Take 20 mg by mouth daily.  No current facility-administered medications for this visit.    Allergies:   Amiodarone and Propylthiouracil   Social History:   The patient  reports that she quit smoking about 18 years ago. Her smoking use included Cigarettes. She has a 50 pack-year smoking history. She has never used smokeless tobacco. She reports that she does not drink alcohol or use illicit drugs.   Family History:  The patient's family history includes Hypertension in her brother; Kidney disease in her brother; Other in her father and mother; Stroke in her brother. There is no history of Colon cancer or Thyroid disease.   ROS:  Please see the history of present illness.  She has constipation. She denies melena or hematochezia.  All other systems reviewed and negative.   PHYSICAL EXAM: VS:  BP 130/60  Pulse 61  Ht 5\' 7"  (1.702 m)  Wt 208 lb (94.348 kg)  BMI 32.57 kg/m2 Well nourished, well developed, in no acute distress HEENT: normal Neck: I cannot assess JVDat 90 Cardiac:  normal S1, S2; irreg irreg rhytm; [id number] systolic murmur heard best along the LSB Lungs:  Bibasilar crackles, no wheezing Abd: distended Ext: 1+ bilateral LE edema Skin: warm and dry Neuro:  CNs 2-12 intact, no focal abnormalities noted  EKG:   V. Paced, HR 61  ASSESSMENT AND PLAN:  1. Community-Acquired Pneumonia: Patient has evidence of bilateral opacities suspicious for pneumonia on Chest CT. She has significant cough with yellow sputum production as well as increased dyspnea. She does not appear toxic on exam. Oxygen saturation is good and she is afebrile. Her illness is complicated by acute on chronic diastolic CHF.  We discussed the possibility of admission to the hospital versus outpatient treatment. She prefers outpatient treatment. I reviewed her case today with Dr. Martinique (DOD).  I will place her on Avelox 400 mg daily for one week. She will be brought back in close follow up. She knows to go to the emergency room if her symptoms worsen. 2. Acute on Chronic Diastolic CHF:  This is in the setting of probable pneumonia. I will increase her Lasix to 80 mg in the  morning and 40 mg in the evening. She will take metolazone 2.5 mg every other day for a total of 3 doses only. Her potassium will be increased to 40 mEq in the morning and 20 mEq in the afternoon. She will have a follow up basic metabolic panel obtained in 3 days. 3. CAD:  Recent LHC with patent stents and non-obstructive disease. Continue med Rx.  Continue aspirin, statin, beta blocker, nitrates.   4. Abnormal Chest CT:  There is a nodular opacity in the left lung base that is probably infectious/inflammatory. We will need to consider a follow up chest CT in the next 3 months. 5. Chronic Kidney Disease, Stage IV:  Continue follow up with nephrology (Dr. Marval Regal).   F/u with Dr. Trula Slade for vascular access.   6. Hypertension:  Controlled 7. Atrial Fibrillation:   Continue Coumadin.  8. Hyperlipidemia:  Continue statin. 9. Disposition: Follow up with me in 1 week.  Signed, Richardson Dopp, PA-C  11/01/2013 10:02 AM

## 2013-11-01 NOTE — Patient Instructions (Signed)
LAB WORK Monday 11/04/13  INCREASE LASIX TO 80 MG IN THE MORNING AND 40 MG IN THE PM INCREASE POTASSIUM TO 40 MEQ IN THE MORNING AND 20 MEQ IN THE PM  START AVELOX 400 MG 1 TABLET DAILY X 7 DAYS THEN STOP  TAKE METOLAZONE 2.5 MG TODAY, AGAIN 3/22 AND 3/24; MAKE SURE TO TAKE 30 MINUTES BEFORE MORNING LASIX   Your physician recommends that you schedule a follow-up appointment in: [place], [place] IS IN THE OFFICE

## 2013-11-02 ENCOUNTER — Encounter (HOSPITAL_COMMUNITY): Payer: Self-pay | Admitting: Emergency Medicine

## 2013-11-02 ENCOUNTER — Emergency Department (HOSPITAL_COMMUNITY)
Admission: EM | Admit: 2013-11-02 | Discharge: 2013-11-02 | Discharge status: Against Medical Advice | Payer: Medicare Other | Attending: Emergency Medicine | Admitting: Emergency Medicine

## 2013-11-02 DIAGNOSIS — N189 Chronic kidney disease, unspecified: Secondary | ICD-10-CM | POA: Insufficient documentation

## 2013-11-02 DIAGNOSIS — I252 Old myocardial infarction: Secondary | ICD-10-CM | POA: Insufficient documentation

## 2013-11-02 DIAGNOSIS — Z9889 Other specified postprocedural states: Secondary | ICD-10-CM | POA: Insufficient documentation

## 2013-11-02 DIAGNOSIS — I129 Hypertensive chronic kidney disease with stage 1 through stage 4 chronic kidney disease, or unspecified chronic kidney disease: Secondary | ICD-10-CM | POA: Insufficient documentation

## 2013-11-02 DIAGNOSIS — Z9861 Coronary angioplasty status: Secondary | ICD-10-CM | POA: Insufficient documentation

## 2013-11-02 DIAGNOSIS — Z87891 Personal history of nicotine dependence: Secondary | ICD-10-CM | POA: Insufficient documentation

## 2013-11-02 DIAGNOSIS — R011 Cardiac murmur, unspecified: Secondary | ICD-10-CM | POA: Insufficient documentation

## 2013-11-02 DIAGNOSIS — J189 Pneumonia, unspecified organism: Secondary | ICD-10-CM | POA: Insufficient documentation

## 2013-11-02 DIAGNOSIS — I2510 Atherosclerotic heart disease of native coronary artery without angina pectoris: Secondary | ICD-10-CM | POA: Insufficient documentation

## 2013-11-02 DIAGNOSIS — Z950 Presence of cardiac pacemaker: Secondary | ICD-10-CM | POA: Insufficient documentation

## 2013-11-02 DIAGNOSIS — I5032 Chronic diastolic (congestive) heart failure: Secondary | ICD-10-CM | POA: Insufficient documentation

## 2013-11-02 NOTE — ED Notes (Signed)
Pt called for room x2. No answer.  

## 2013-11-02 NOTE — ED Notes (Signed)
Pt presents with respiratory difficulty and nonproductive X 3 weeks. Pt states that she was diagnosed with PNA on yesterday and given po antibiotics. Pt states that she has had an increase in respiratory difficulty X 1 day. Pt denies any fevers.

## 2013-11-02 NOTE — ED Notes (Signed)
Pt called again for room x2- no answer.

## 2013-11-04 ENCOUNTER — Encounter: Payer: Self-pay | Admitting: Surgery

## 2013-11-04 ENCOUNTER — Ambulatory Visit (HOSPITAL_COMMUNITY)
Admission: RE | Admit: 2013-11-04 | Discharge: 2013-11-04 | Disposition: A | Discharge status: Home / Self Care | Payer: Medicare Other | Source: Ambulatory Visit | Attending: Surgery | Admitting: Surgery

## 2013-11-04 ENCOUNTER — Ambulatory Visit (INDEPENDENT_AMBULATORY_CARE_PROVIDER_SITE_OTHER): Payer: Self-pay | Admitting: Surgery

## 2013-11-04 VITALS — BP 139/67 | HR 66 | Ht 67.0 in | Wt 206.1 lb

## 2013-11-04 DIAGNOSIS — N186 End stage renal disease: Secondary | ICD-10-CM | POA: Insufficient documentation

## 2013-11-04 DIAGNOSIS — Z4931 Encounter for adequacy testing for hemodialysis: Secondary | ICD-10-CM

## 2013-11-04 NOTE — Progress Notes (Signed)
[facility] POST OPERATIVE OFFICE NOTE  CC:  F/u for surgery  HPI:  This is a 79 y.o. female who is s/p 2nd stage right BVT 09/20/13.  She has done quite well since surgery.  She states she has an area on one of the incisions that is an open sore.  She states that she was getting out of the car during the ice 2-3 weeks ago and had to grad the door and may have hit her arm then.  She denies any fevers or any drainage from the wound.  She denies any symptoms of steal syndrome.  She is not on HD yet.  Allergies  Allergen Reactions  . Amiodarone Hives  . Propylthiouracil Rash    Current Outpatient Prescriptions  Medication Sig Dispense Refill  . ALPRAZolam (XANAX) 0.5 MG tablet Take 0.5 mg by mouth 3 (three) times daily as needed for anxiety.       . furosemide (LASIX) 40 MG tablet 80 MG IN THE AM AND 40 MG IN THE PM      . hydrALAZINE (APRESOLINE) 25 MG tablet Take 25 mg by mouth 3 (three) times daily.      . isosorbide mononitrate (IMDUR) 60 MG 24 hr tablet Take 60 mg by mouth 2 (two) times daily.      Marland Kitchen lovastatin (MEVACOR) 10 MG tablet Take 10 mg by mouth at bedtime.       . magnesium oxide (MAG-OX) 400 MG tablet Take 400 mg by mouth at bedtime.       . metoCLOPramide (REGLAN) 5 MG tablet Take 1 tablet (5 mg total) by mouth 3 (three) times daily before meals.  30 tablet  0  . metolazone (ZAROXOLYN) 2.5 MG tablet TAKE 2.5 MG TODAY 3/20 ALSO ON Sunday 3/22 AND Tuesday 3/24  5 tablet  0  . metoprolol tartrate (LOPRESSOR) 25 MG tablet Take 1 tablet (25 mg total) by mouth 2 (two) times daily.  60 tablet  0  . moxifloxacin (AVELOX) 400 MG tablet Take 1 tablet (400 mg total) by mouth daily at 8 pm.  14 tablet  0  . nitroGLYCERIN (NITROSTAT) 0.4 MG SL tablet Place 0.4 mg under the tongue every 5 (five) minutes as needed for chest pain.      Marland Kitchen ondansetron (ZOFRAN) 4 MG tablet Take 1 tablet (4 mg total) by mouth every 8 (eight) hours as needed for nausea or vomiting.  40 tablet  2  .  oxyCODONE-acetaminophen (PERCOCET/ROXICET) 5-325 MG per tablet Take 1 tablet by mouth 3 (three) times daily as needed for moderate pain. For pain  30 tablet  0  . pantoprazole (PROTONIX) 40 MG tablet Take 40 mg by mouth 2 (two) times daily.       . paricalcitol (ZEMPLAR) 1 MCG capsule Take 1 mcg by mouth at bedtime.      . potassium chloride SA (K-DUR,KLOR-CON) 20 MEQ tablet Take 1 tablet (20 mEq total) by mouth as directed. TAKE 40 MEQ IN THE AM AND 20 MEQ IN THE PM      . warfarin (COUMADIN) 2 MG tablet Take 2-3 mg by mouth daily at 6 PM. Takes 1 tablets (3mg ) on Mondays and Fridays.  Takes 1 tablet (2mg ) on all remaining days.      . [DISCONTINUED] FLUoxetine (PROZAC) 20 MG capsule Take 20 mg by mouth daily.         No current facility-administered medications for this visit.     ROS:  See HPI  Physical Exam:  Filed Vitals:   11/04/13 1416  BP: 139/67  Pulse: 66   Duplex of right BVT 11/04/13: -There is good depth and diameter to the fistula.  There is a side branch that measures 0.31 cm midway up the fistula. -There is a velocity of 801cm/s noted at the anastomosis which may be due to angle of the vessel takeoff as well as tortuosity of te basilic vein just before the anastomosis. -there is also a hematoma measuring approximately 2 x 3.18cm in diameter, but does not appear to be causing any extrinsic compression on the basilic vein.  Incision:  The distal incision is open with eschar present.  There is no drainage.  All other incisions have healed nicely. Extremities:  1-2+ palpable right radial pulse.  There is a good thrill and bruit throughout the fistula.   A/P:  This is a 79 y.o. female here for f/u to 2nd stage right BVT. The fistula is maturing nicely with good depth and diameter.  If the fistula does not continue to [facility] is a side branch that may need ligation in the future.  -her wound was debrided by Dr. Trula Slade today and does not look infected.  She will do wet  to dry dressings to this daily and f/u with Dr. Trula Slade in 4 weeks.   Leontine Locket, PA-C [facility] [phone]  Clinic MD:  Pt seen and examined with Dr. Trula Slade  I agree with the above.  I have seen and examined the patient.  I have reviewed her ultrasound shows excellent result.  She has an excellent thrill by palpation.  She did have separation of the incision which I debrided today.  I recommended wet to dry dressing changes.  She will followup in one month.  Annamarie Major

## 2013-11-07 ENCOUNTER — Telehealth: Payer: Self-pay | Admitting: *Deleted

## 2013-11-07 ENCOUNTER — Encounter: Payer: Self-pay | Admitting: Physician Assistant

## 2013-11-07 ENCOUNTER — Ambulatory Visit (INDEPENDENT_AMBULATORY_CARE_PROVIDER_SITE_OTHER): Payer: Medicare Other | Admitting: Physician Assistant

## 2013-11-07 VITALS — BP 119/60 | HR 62 | Ht 67.0 in | Wt 208.0 lb

## 2013-11-07 DIAGNOSIS — I10 Essential (primary) hypertension: Secondary | ICD-10-CM

## 2013-11-07 DIAGNOSIS — R9389 Abnormal findings on diagnostic imaging of other specified body structures: Secondary | ICD-10-CM

## 2013-11-07 DIAGNOSIS — I5033 Acute on chronic diastolic (congestive) heart failure: Secondary | ICD-10-CM

## 2013-11-07 DIAGNOSIS — N184 Chronic kidney disease, stage 4 (severe): Secondary | ICD-10-CM

## 2013-11-07 DIAGNOSIS — J189 Pneumonia, unspecified organism: Secondary | ICD-10-CM

## 2013-11-07 DIAGNOSIS — I2510 Atherosclerotic heart disease of native coronary artery without angina pectoris: Secondary | ICD-10-CM

## 2013-11-07 DIAGNOSIS — S41109A Unspecified open wound of unspecified upper arm, initial encounter: Secondary | ICD-10-CM

## 2013-11-07 DIAGNOSIS — I5032 Chronic diastolic (congestive) heart failure: Secondary | ICD-10-CM

## 2013-11-07 DIAGNOSIS — I4891 Unspecified atrial fibrillation: Secondary | ICD-10-CM

## 2013-11-07 LAB — BASIC METABOLIC PANEL
BUN: 54 mg/dL — AB (ref 6–23)
CO2: 29 mEq/L (ref 19–32)
CREATININE: 3.4 mg/dL — AB (ref 0.4–1.2)
Calcium: 8.8 mg/dL (ref 8.4–10.5)
Chloride: 98 mEq/L (ref 96–112)
GFR: 13.92 mL/min — AB (ref >60.00)
Glucose, Bld: 95 mg/dL (ref 70–99)
Potassium: 3.5 mEq/L (ref 3.5–5.1)
Sodium: 138 mEq/L (ref 135–145)

## 2013-11-07 MED ORDER — POTASSIUM CHLORIDE CRYS ER 20 MEQ PO TBCR: EXTENDED_RELEASE_TABLET | ORAL | Status: DC

## 2013-11-07 MED ORDER — FUROSEMIDE 40 MG PO TABS: ORAL_TABLET | ORAL | Status: DC

## 2013-11-07 NOTE — Telephone Encounter (Signed)
lmptcb for lab results and med changes. Since these were lab results called in as critical from the lab I lmom as to what the instructions are and for ptcb 3/27 AM to confirm she did recv message and understand. I will try again before I leave at 6 pm. I changed med list to show new directions for lasix and K+, bmet order placed as well;

## 2013-11-07 NOTE — Patient Instructions (Signed)
TAKE 1 ONE MORE DOSE OF METOLAZONE TOMORROW 11/08/13 THEN STOP PER SCOTT WEAVER, PAC  LAB WORK TODAY; BMET  REPEAT LAB WORK (BMET) IN 5-7 DAYS  YOU WILL NEED THE NEXT AVA LIABLE WITH DR. Johnsie Cancel PER DR. Johnsie Cancel  YOU NEED TO FOLLOW UP WITH DR. Trula Slade OR PA IN REFERENCE TO  ANTECUBITAL WOUND THAT IS DRAINING  You have been referred to [facility]

## 2013-11-07 NOTE — Telephone Encounter (Signed)
s/w [name] about lab results and med changes; bobby comes in to pt's appt and is her caregiver. He and I went over the cahnges on the lasix and K+ x 3 with verbal understanding from [name], bmet 3/30. [name] advised to have pt monitor her weight and if wt up 2 lb x 1 day then ok to take extra lasix.

## 2013-11-07 NOTE — Progress Notes (Signed)
[address], [place], [name]  [address] Phone: [phone] Fax:  [phone]  Date:  11/07/2013   ID:  [name], DOB [birth year], MRN [id number]  PCP:  Tamsen Roers, MD  Cardiologist:  Dr. Jenkins Rouge   Electrophysiologist:  Dr. Cristopher Peru  Nephrologist: Dr. Marval Regal   History of Present Illness: [name] is a 79 y.o. female with a hx of CAD, s/p DES to the OM in 6/06, rotational atherectomy + DES to pLAD in 7/09, symptomatic bradycardia s/p pacemaker with upgrade to CRT-P 11/08, AFib s/p AVN ablation (prior amiodarone thyroiditis), chronic diastolic CHF, mitral regurgitation, CKD stage IV, HTN, HL, GERD, prior UGI bleed in 2008 related to NSAID use, PVD.  She has had a difficult time with access for hemodialysis. She had a brachial vein transposition in 09/2013.  She was seen in 06/2013 with worsening chest pain consistent with CCS class III angina. She was ultimately set up for cardiac catheterization that demonstrated patent stent in the LAD and obtuse marginal 1 with nonobstructive disease elsewhere. Medical therapy was continued.  She was admitted in 07/2013 with progressive dysphagia. EGD demonstrated large prepyloric ulcer and polyp which was removed. She had esophageal dilatation.  Admitted in 09/2013 with a/c renal failure in the setting of nausea, vomiting and anorexia with poor by mouth intake.   I saw her last week. She had a cough and worsening dyspnea.  Chest CT recently demonstrated evidence of bilateral infiltrates.  I felt she had evidence of acute on chronic diastolic CHF in the setting of community acquired pneumonia and CKD. I adjusted her diuretics. I placed her on Avelox for treatment of her pneumonia. She was brought back today for close followup.   She is feeling better. Her cough is improved. Her breathing is improved. Her weight at home is down 7 pounds. She sleeps on one pillow. LE edema is fairly stable. Increased abdominal  girth is improved. She is not yet quite back to her baseline.  Studies: - Myoview (01/2009): EF 79%, no scar or ischemia.   - Echo (03/2013):  Mild focal basal septal hypertrophy, EF 55-60%, mild MR, moderate to severe LAE, mild to moderate RVE, reduced RVSF, moderate RAE.   - LHC (07/14/13):  pLAD stent ok, LAD 30-40, ostial first septal perf 80, OM1 stent ok with 20 ISR, AVCFX 20-30, prox to mid RCA 30-40.  Med Rx.    Recent Labs: 10/27/2013: ALT 11; Creatinine 2.68*; Hemoglobin 12.7; Potassium 4.6  10/28/2013: Pro B Natriuretic peptide (BNP) 1368.0* 10/27/2013: POC INR 2.16*; WBC 7.1   Abdominal CT (10/2013): IMPRESSION: Scattered hepatic cysts measuring up to 17 mm. Liver is otherwise unremarkable on unenhanced CT. 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 Mild patchy/ground-glass opacity in the right middle lobe, suspicious for pneumonia. Trace right pleural effusion. 11 x 7 mm patchy/nodular opacity at the left lung base, possibly infectious/inflammatory, although indeterminate. Consider follow-up CT chest in 3 months to assess for persistence.    Wt Readings from Last 3 Encounters:  11/07/13 208 lb (94.348 kg)  11/04/13 206 lb 1.6 oz (93.486 kg)  11/01/13 208 lb (94.348 kg)     Past Medical History  Diagnosis Date  . Cellulitis and abscess of unspecified site     a. h/o MSRA cellulitis of abdominal wall.  Marland Kitchen CAD (coronary artery disease)     a. DES to OM 01/2005. b. Rotational atherotomy/DES to prox LAD 02/2008. c. Cath 07/2013:  stable, for med rx.  . Edema   . Warfarin anticoagulation     a. Followed by PCP.  Marland Kitchen GI bleed     a. EGD 01/2007: antral ulcers related to NSAID/aspirin use.  . Peripheral vascular disease   . Hyperthyroidism     a. Hx of intolerance to PTU.  Marland Kitchen Blood transfusion years ago    "heart problems" (09/26/2013)  . H/O hiatal hernia   . Umbilical hernia     unrepaired (11/24/11)  .  Valvular heart disease     Echo 11/2011: mild-mod MR.  Marland Kitchen Hypertension     takes Amlodipine,Hydralazine,and Imdur daily  . Hyperlipidemia     takes Lovastatin daily  . SSS (sick sinus syndrome)     a. H/o AV node ablation 2008 secondary to difficult to control afib/flutter, with subsequent pacer.  . Atrial fibrillation with rapid ventricular response     a. H/o amiodarone thyroiditis. b. s/p AV node ablation 2008 with implantation of PPM 01/2007, upgraded to CRT-P 06/2007.;takes Coumadin daily  . GERD (gastroesophageal reflux disease)     takes Protonix daily  . Chronic diastolic CHF (congestive heart failure)     takes Lasix daily and Spironolactone   . Pacemaker     Medtronic  . History of gout   . Bruises easily   . Hemorrhoids   . Depression     takes Xanax daily  . Insomnia     takes Xanax if needed  . Heart murmur   . Chronic renal insufficiency     Cr ~2.3.  . Polycystic kidney disease     a. Per remote E-Chart records.  . Kidney stones   . Myocardial infarction     "I've had 2-3; last one was in /1996" (09/26/2013)  . Exertional shortness of breath   . Migraines     "last one was a pretty good while ago" (09/26/2013)  . Arthritis     "all over" (09/26/2013)  . Chronic lower back pain     deteriorating disc  . Anxiety   . Iron deficiency anemia     takes an Iron Pill daily   Past Surgical History  Procedure Laterality Date  . Esophagogastroduodenoscopy    . Tonsillectomy  ~ 1960  . Laminectomy and microdiscectomy lumbar spine  08/2003  . Cardioversion  10/2004; 02/2007    /E-chart  . Cholecystectomy    . Appendectomy  1954  . Vaginal hysterectomy  ?1981  . Av node ablation  02/2007    /E-chart  . Insert / replace / remove pacemaker  01/2007    initial placement  . Insert / replace / remove pacemaker  06/2007    upgraded/E-chart  . Av fistula placement, brachiocephalic Right 12/2839    "didn't work; my veins were too small"  . Joint replacement    . Total knee  arthroplasty Left 01/2009  . Bladder and bowel  1970's    "tacked; damaged my intestines & had to remove some; done @ [facility]  . Cataract extraction w/ intraocular lens  implant, bilateral Bilateral   . Bascilic vein transposition Right 06/21/2013    Procedure: [place];  Surgeon: Serafina Mitchell, MD;  Location: [facility] OR;  Service: Vascular;  Laterality: Right;  . Esophagogastroduodenoscopy N/A 07/19/2013    Procedure: ESOPHAGOGASTRODUODENOSCOPY (EGD);  Surgeon: Lafayette Dragon, MD;  Location: [facility] ENDOSCOPY;  Service: Endoscopy;  Laterality: N/A;  . Balloon dilation N/A 07/19/2013    Procedure: BALLOON DILATION;  Surgeon: Lafayette Dragon, MD;  Location: [facility] ENDOSCOPY;  Service: Endoscopy;  Laterality: N/A;  . Esophagogastroduodenoscopy N/A 07/20/2013    Procedure: ESOPHAGOGASTRODUODENOSCOPY (EGD);  Surgeon: Lafayette Dragon, MD;  Location: [name] ENDOSCOPY;  Service: Endoscopy;  Laterality: N/A;  with removal of polyp in pylorus  . Bascilic vein transposition Right 09/20/2013    Procedure: 2ND STAGE RIGHT BASCILIC VEIN TRANSPOSITION; ULTRSOUND GUIDED;  Surgeon: Serafina Mitchell, MD;  Location: [facility] OR;  Service: Vascular;  Laterality: Right;  . Cardiac catheterization  2006; 2008; 2009; 01/13/2011  . Coronary angioplasty with stent placement  ~ 2007; ?    "had a total of 2 stents put in" 92/07/2014)  . Back surgery       Current Outpatient Prescriptions  Medication Sig Dispense Refill  . ALPRAZolam (XANAX) 0.5 MG tablet Take 0.5 mg by mouth 3 (three) times daily as needed for anxiety.       . furosemide (LASIX) 40 MG tablet 80 MG IN THE AM AND 40 MG IN THE PM      . hydrALAZINE (APRESOLINE) 25 MG tablet Take 25 mg by mouth 3 (three) times daily.      . isosorbide mononitrate (IMDUR) 60 MG 24 hr tablet Take 60 mg by mouth daily.       Marland Kitchen lovastatin (MEVACOR) 10 MG tablet Take 10 mg by mouth at bedtime.       . magnesium oxide (MAG-OX) 400 MG tablet Take 400 mg by mouth at bedtime.        . metoprolol tartrate (LOPRESSOR) 25 MG tablet Take 1 tablet (25 mg total) by mouth 2 (two) times daily.  60 tablet  0  . nitroGLYCERIN (NITROSTAT) 0.4 MG SL tablet Place 0.4 mg under the tongue every 5 (five) minutes as needed for chest pain.      Marland Kitchen ondansetron (ZOFRAN) 4 MG tablet Take 1 tablet (4 mg total) by mouth every 8 (eight) hours as needed for nausea or vomiting.  40 tablet  2  . oxyCODONE-acetaminophen (PERCOCET/ROXICET) 5-325 MG per tablet Take 1 tablet by mouth 3 (three) times daily as needed for moderate pain. For pain  30 tablet  0  . pantoprazole (PROTONIX) 40 MG tablet Take 40 mg by mouth 2 (two) times daily.       . potassium chloride SA (K-DUR,KLOR-CON) 20 MEQ tablet Take 1 tablet (20 mEq total) by mouth as directed. TAKE 40 MEQ IN THE AM AND 20 MEQ IN THE PM      . warfarin (COUMADIN) 2 MG tablet Take 2-3 mg by mouth daily at 6 PM. Takes 1 tablets (3mg ) on Mondays and Fridays.  Takes 1 tablet (2mg ) on all remaining days.      . [DISCONTINUED] FLUoxetine (PROZAC) 20 MG capsule Take 20 mg by mouth daily.         No current facility-administered medications for this visit.    Allergies:   Amiodarone and Propylthiouracil   Social History:  The patient  reports that she quit smoking about 18 years ago. Her smoking use included Cigarettes. She has a 50 pack-year smoking history. She has never used smokeless tobacco. She reports that she does not drink alcohol or use illicit drugs.   Family History:  The patient's family history includes Hypertension in her brother; Kidney disease in her brother; Other in her father and mother; Stroke in her brother. There is no history of Colon cancer or Thyroid disease.   ROS:  Please see the history of present  illness.  She has an open area in her R antecubital fossa that was recently draining.  All other systems reviewed and negative.   PHYSICAL EXAM: VS:  BP 119/60  Pulse 62  Ht 5\' 7"  (1.702 m)  Wt 208 lb (94.348 kg)  BMI 32.57  kg/m2 Well nourished, well developed, in no acute distress HEENT: normal Neck: I cannot assess JVDat 90 Cardiac:  normal S1, S2; irreg irreg rhytm; 7-1/2 systolic murmur heard best along the LSB Lungs:  Clear to ausc, no rales, no wheezing Abd: distended Ext: trace-1+ bilateral LE edema Skin: warm and dry Neuro:  CNs 2-12 intact, no focal abnormalities noted  EKG:   V paced, HR 62  ASSESSMENT AND PLAN:  1. Community-Acquired Pneumonia:  Improved.  Given abnormal findings on CT, I will refer her to pulmonary for further evaluation.   2. Chronic Diastolic CHF:  Volume is improved.  Will give one more dose of metolazone.  Repeat BMET and BNP today.  3. CAD:  Recent LHC with patent stents and non-obstructive disease. Continue med Rx.  Continue aspirin, statin, beta blocker, nitrates.   4. Abnormal Chest CT:  Refer to pulmonary. 5. Chronic Kidney Disease, Stage IV:  Check BMET today.  Continue follow up with nephrology (Dr. Marval Regal).   F/u with Dr. Trula Slade for vascular access.   6. S/p Brachial Vein Transposition:  She has a small open area that has had some drainage recently.  Will have her f/u with [facility] next week.  7. Hypertension:  Controlled 8. Atrial Fibrillation:   Continue Coumadin.  9. Hyperlipidemia:  Continue statin. 10. Disposition: Follow up Dr. Jenkins Rouge in the next few weeks.   Signed, Richardson Dopp, PA-C  11/07/2013 3:07 PM

## 2013-11-08 ENCOUNTER — Encounter: Payer: Self-pay | Admitting: Internal Medicine

## 2013-11-08 ENCOUNTER — Encounter: Payer: Self-pay | Admitting: Family

## 2013-11-08 ENCOUNTER — Ambulatory Visit (INDEPENDENT_AMBULATORY_CARE_PROVIDER_SITE_OTHER): Payer: Medicare Other | Admitting: Internal Medicine

## 2013-11-08 ENCOUNTER — Ambulatory Visit (INDEPENDENT_AMBULATORY_CARE_PROVIDER_SITE_OTHER): Payer: Self-pay | Admitting: Family

## 2013-11-08 VITALS — BP 130/70 | HR 61 | Ht 67.0 in | Wt 206.0 lb

## 2013-11-08 VITALS — BP 146/73 | HR 69 | Temp 96.8°F | Resp 18 | Ht 67.0 in | Wt 208.3 lb

## 2013-11-08 DIAGNOSIS — N186 End stage renal disease: Secondary | ICD-10-CM

## 2013-11-08 DIAGNOSIS — R911 Solitary pulmonary nodule: Secondary | ICD-10-CM

## 2013-11-08 DIAGNOSIS — Z5189 Encounter for other specified aftercare: Secondary | ICD-10-CM

## 2013-11-08 DIAGNOSIS — R0602 Shortness of breath: Secondary | ICD-10-CM

## 2013-11-08 NOTE — Progress Notes (Addendum)
    Established Previous Access  History of Present Illness  [name] is a 79 y.o. ([birth year]) female patient of Dr. Trula Slade who is s/p 2nd stage right BVT 09/20/13. She has done quite well since surgery. She states she has an area on one of the incisions that is an open sore. She states that she was getting out of the car during the ice 2-3 weeks ago and had to grad the door and may have hit her arm then. She denies any fevers or any drainage from the wound. She denies any symptoms of steal syndrome. She is not on HD yet. This was debrided by Dr. Trula Slade on 11/04/2013. She returns today for wound drainage, as requested by Richardson Dopp, PA, from Dr. Kyla Balzarine office. She denies fever or chills. Has been dressing right upper arm open incision area with Neosporin and dry gauze dressings; advised to do wet to dry dressings twice daily, will instruct how to do. She is taking Avalox. She denies steal symptoms in right arm.  The patient's PMH, PSH, SH, FamHx, Med, and Allergies are unchanged from 06/10/2013.  On ROS today: see HPI for pertinent positives and negatives.  Physical Examination  Filed Vitals:   11/08/13 1334  BP: 146/73  Pulse: 69  Temp: 96.8 F (36 C)  Resp: 18   Filed Weights   11/08/13 1334  Weight: 208 lb 4.8 oz (94.484 kg)   Body mass index is 32.62 kg/(m^2).  General: A&O x 3, WD, Obese female.  Pulmonary: Respirations are non labored  Cardiac: RRR   Vascular: Vessel Right Left  Radial Palpable Palpable   Right upper arm AV fistula with palpable thrill  Musculoskeletal: Moving all extremities. Hand grip is equal and 5/5 strength.  Neurologic: Has some hearing loss.   Medical Decision Making  [name] is a 79 y.o. female who presents with: open surgical incision right upper arm s/p 2nd stage right BVT 09/20/13. There is minimal swelling and redness around the surgical site  Based on the patient's vascular studies and examination, and after  discussing with Dr. Bridgett Larsson and Dr. Bridgett Larsson examining patient, patient advised to finish the Avalox and do twice daily wet to dry dressings, patient and son instructed how to do this.  Return to clinic to see Dr. Trula Slade in one month.   I discussed in depth with the patient the nature of atherosclerosis, and emphasized the importance of maximal medical management including strict control of blood pressure, blood glucose, and lipid levels, obtaining regular exercise, and cessation of smoking.  The patient is aware that without maximal medical management the underlying atherosclerotic disease process will progress, limiting the benefit of any interventions.    Thank you for allowing [place] to participate in this patient's care.  Clemon Chambers, RN, MSN, FNP-C [facility] Office: [phone]  Clinic MD: Bridgett Larsson  11/08/2013, 1:33 PM

## 2013-11-08 NOTE — Patient Instructions (Addendum)
#  Pneumonia and lung nodule on CT  - repeat CT chest wo contrast in 2 months  #Shortness of breath  - do ONO Test on Room Air ASAP  - do PFT breathing test next several days/few weeks - do duplex Lower Extremity rule out DVT  #Followup  - next few weeks after you complete tests in the dyspnea section  - bring all all your medicines for med calendar with NP Tammy  - Tammy will also review your test results

## 2013-11-08 NOTE — Progress Notes (Signed)
Subjective:    Patient ID: [name], female    DOB: 1934-12-07, 79 y.o.   MRN: [id number] PCP Tamsen Roers, MD Referred bu  DR Johnsie Cancel HPI  [name] PAISLIE TESSLER is a 79 y.o. female with a hx of CAD,  reports that she quit smoking about 18 years ago.  She has a 50 pack-year smoking history.s/p DES to the OM in 6/06, rotational atherectomy + DES to pLAD in 7/09, symptomatic bradycardia s/p pacemaker with upgrade to CRT-P 11/08, AFib s/p AVN ablation (prior amiodarone thyroiditis), chronic diastolic CHF, mitral regurgitation, CKD stage IV, HTN, HL, GERD, prior UGI bleed in 2008 related to NSAID use, PVD. She has had a difficult time with access for hemodialysis. She had a brachial vein transposition in 09/2013.   She was seen in 06/2013 with worsening chest pain consistent with CCS class III angina. She was ultimately set up for cardiac catheterization that demonstrated patent stent in the LAD and obtuse marginal 1 with nonobstructive disease elsewhere. Medical therapy was continued.  She was admitted in 07/2013 with progressive dysphagia. EGD demonstrated large prepyloric ulcer and polyp which was removed. She had esophageal dilatation.   Admitted in 09/2013 with acute on chronic renal failure in the setting of nausea, vomiting and anorexia with poor by mouth intake. Last seen by Dr. Johnsie Cancel 10/2013. She was recently seen by gastroenterology with persistent nausea as well as abdominal bloating, progressive dyspnea and orthopnea. Abdominal CT demonstrated groundglass opacity in the right middle lobe suspicious for pneumonia with trace effusion as well as nodular opacity in the left lung base suspicious for an infectious/inflammatory process, scattered hepatic cysts measuring up to 17 mm, findings consistent with polycystic kidney disease. No ascites was noted. Liver size was normal. BNP remained elevated at 1368.    She presented to cardiology in 11/01/2013 and she reported  She has had significant  cough over the last couple of weeks. She described yellow sputum. She notes poor appetite. She sometimes coughs hard enough to vomit. She also has vomiting with certain meals. She denies hemoptysis. She denies significant fever. She feels weak. She notes increased shortness of breath. She probably described NYHA class III symptoms. She denies orthopnea. She has noted PND as well as edema.   At this time CT scan of the abdomen lung cut showed findings of possible pneumonia [see below]. She was then discharged with 7 days of Avelox. And she was referred to pulmonary. Today 11/08/2013 she presents for pulmonary followup in consultation. She reports that her cough is significantly improved at least over 70% since starting antibiotics. There is some residual cough with mild associated postnasal drainage. However, her main concern is dyspnea since December 2014 as of insidious onset and slowly progressive. Probably New [place] Heart Association class III. Relieved by rest. The last several weeks his increasing edema.   Studies:  - Myoview (01/2009): EF 79%, no scar or ischemia.  - Echo (03/2013): Mild focal basal septal hypertrophy, EF 55-60%, mild MR, moderate to severe LAE, mild to moderate RVE, reduced RVSF, moderate RAE.  - LHC (07/14/13): pLAD stent ok, LAD 30-40, ostial first septal perf 80, OM1 stent ok with 20 ISR, AVCFX 20-30, prox to mid RCA 30-40. Med Rx.  Recent Labs:  10/27/2013: ALT 11; Creatinine 2.68*; Hemoglobin 12.7; Potassium 4.6  10/28/2013: Pro B Natriuretic peptide (BNP) 1368.0*  10/27/2013: POC INR 2.16*; WBC 7.1  Abdominal CT (10/2013):  IMPRESSION: Scattered hepatic cysts measuring up to 17 mm. Liver is otherwise unremarkable  on unenhanced CT. 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 Mild patchy/ground-glass opacity in the right middle lobe, suspicious for pneumonia. Trace right pleural effusion. 11 x 7 mm  patchy/nodular opacity at the left lung base, possibly infectious/inflammatory, although indeterminate. Consider follow-up CT chest in 3 months to assess for persistence.        Past Medical History  Diagnosis Date  . Cellulitis and abscess of unspecified site     a. h/o MSRA cellulitis of abdominal wall.  Marland Kitchen CAD (coronary artery disease)     a. DES to OM 01/2005. b. Rotational atherotomy/DES to prox LAD 02/2008. c. Cath 07/2013: stable, for med rx.  . Edema   . Warfarin anticoagulation     a. Followed by PCP.  Marland Kitchen GI bleed     a. EGD 01/2007: antral ulcers related to NSAID/aspirin use.  . Peripheral vascular disease   . Hyperthyroidism     a. Hx of intolerance to PTU.  Marland Kitchen Blood transfusion years ago    "heart problems" (09/26/2013)  . H/O hiatal hernia   . Umbilical hernia     unrepaired (11/24/11)  . Valvular heart disease     Echo 11/2011: mild-mod MR.  Marland Kitchen Hypertension     takes Amlodipine,Hydralazine,and Imdur daily  . Hyperlipidemia     takes Lovastatin daily  . SSS (sick sinus syndrome)     a. H/o AV node ablation 2008 secondary to difficult to control afib/flutter, with subsequent pacer.  . Atrial fibrillation with rapid ventricular response     a. H/o amiodarone thyroiditis. b. s/p AV node ablation 2008 with implantation of PPM 01/2007, upgraded to CRT-P 06/2007.;takes Coumadin daily  . GERD (gastroesophageal reflux disease)     takes Protonix daily  . Chronic diastolic CHF (congestive heart failure)     takes Lasix daily and Spironolactone   . Pacemaker     [employer]tr[employer]  . History of gout   . Bruises easily   . Hemorrhoids   . Depression     takes Xanax daily  . Insomnia     takes Xanax if needed  . Heart murmur   . Chronic renal insufficiency     Cr ~2.3.  . Polycystic kidney disease     a. Per remote E-Chart records.  . Kidney stones   . Myocardial infarction     "I've had 2-3; last one was in /1996" (09/26/2013)  . Exertional shortness of breath   . Migraines      "last one was a pretty good while ago" (09/26/2013)  . Arthritis     "all over" (09/26/2013)  . Chronic lower back pain     deteriorating disc  . Anxiety   . Iron deficiency anemia     takes an Iron Pill daily  . Pneumonia      Family History  Problem Relation Age of Onset  . Colon cancer Neg Hx   . Thyroid disease Neg Hx   . Stroke Brother   . Hypertension Brother   . Kidney disease Brother   . Other Mother     Sepsis and perforated colon  . Other Father     Motor vehicle accident     History   Social History  . Marital Status: Widowed    Spouse Name: N/A    Number of Children: N/A  . Years of Education: N/A   Occupational History  . Not on file.   Social History Main Topics  .  Smoking status: Former Smoker -- 1.00 packs/day for 50 years    Types: Cigarettes    Quit date: 07/13/1995  . Smokeless tobacco: Never Used     Comment: quit smoking around 1996  . Alcohol Use: No  . Drug Use: No  . Sexual Activity: No   Other Topics Concern  . Not on file   Social History Narrative   Lives in [place].     Allergies  Allergen Reactions  . Amiodarone Hives  . Propylthiouracil Rash     Outpatient Prescriptions Prior to Visit  Medication Sig Dispense Refill  . ALPRAZolam (XANAX) 0.5 MG tablet Take 0.5 mg by mouth 3 (three) times daily as needed for anxiety.       . furosemide (LASIX) 40 MG tablet 40 mg twice daily      . hydrALAZINE (APRESOLINE) 25 MG tablet Take 25 mg by mouth 3 (three) times daily.      . isosorbide mononitrate (IMDUR) 60 MG 24 hr tablet Take 60 mg by mouth daily.       Marland Kitchen lovastatin (MEVACOR) 10 MG tablet Take 10 mg by mouth at bedtime.       . magnesium oxide (MAG-OX) 400 MG tablet Take 400 mg by mouth at bedtime.       . metoprolol tartrate (LOPRESSOR) 25 MG tablet Take 1 tablet (25 mg total) by mouth 2 (two) times daily.  60 tablet  0  . nitroGLYCERIN (NITROSTAT) 0.4 MG SL tablet Place 0.4 mg under the tongue every 5 (five) minutes as  needed for chest pain.      Marland Kitchen ondansetron (ZOFRAN) 4 MG tablet Take 1 tablet (4 mg total) by mouth every 8 (eight) hours as needed for nausea or vomiting.  40 tablet  2  . oxyCODONE-acetaminophen (PERCOCET/ROXICET) 5-325 MG per tablet Take 1 tablet by mouth 3 (three) times daily as needed for moderate pain. For pain  30 tablet  0  . pantoprazole (PROTONIX) 40 MG tablet Take 40 mg by mouth 2 (two) times daily.       . potassium chloride SA (K-DUR,KLOR-CON) 20 MEQ tablet 20 meq twice daily      . warfarin (COUMADIN) 2 MG tablet Take 2-3 mg by mouth daily at 6 PM. Takes 1 tablets (3mg ) on Mondays and Fridays.  Takes 1 tablet (2mg ) on all remaining days.       No facility-administered medications prior to visit.      Review of Systems  Constitutional: Positive for appetite change and unexpected weight change. Negative for fever.  HENT: Positive for trouble swallowing. Negative for congestion, ear pain, nosebleeds, postnasal drip, rhinorrhea, sinus pressure, sneezing and sore throat.   Eyes: Negative for redness and itching.  Respiratory: Positive for shortness of breath. Negative for cough, chest tightness and wheezing.   Cardiovascular: Positive for palpitations and leg swelling.       Hand and feet  Gastrointestinal: Positive for abdominal distention. Negative for nausea and vomiting.  Genitourinary: Negative for dysuria.  Musculoskeletal: Negative for joint swelling.  Skin: Negative for rash.  Neurological: Negative for headaches.  Hematological: Does not bruise/bleed easily.  Psychiatric/Behavioral: Positive for dysphoric mood. The patient is nervous/anxious.        Objective:   Physical Exam  Vitals reviewed. Constitutional: She is oriented to person, place, and time. She appears well-developed and well-nourished. No distress.  Obese Deconditioned Sitting in wheel chair  HENT:  Head: Normocephalic and atraumatic.  Right Ear: External ear normal.  Left Ear:  External ear  normal.  Mouth/Throat: Oropharynx is clear and moist. No oropharyngeal exudate.  Eyes: Conjunctivae and EOM are normal. Pupils are equal, round, and reactive to light. Right eye exhibits no discharge. Left eye exhibits no discharge. No scleral icterus.  Neck: Normal range of motion. Neck supple. No JVD present. No tracheal deviation present. No thyromegaly present.  Cardiovascular: Normal rate, regular rhythm, normal heart sounds and intact distal pulses.  Exam reveals no gallop and no friction rub.   No murmur heard. Pulmonary/Chest: Effort normal. No respiratory distress. She has no wheezes. She has rales. She exhibits no tenderness.  Some scattered crackles. No distress  Abdominal: Soft. Bowel sounds are normal. She exhibits no distension and no mass. There is no tenderness. There is no rebound and no guarding.  Musculoskeletal: Normal range of motion. She exhibits edema. She exhibits no tenderness.  ++ edema  Lymphadenopathy:    She has no cervical adenopathy.  Neurological: She is alert and oriented to person, place, and time. She has normal reflexes. No cranial nerve deficit. She exhibits normal muscle tone. Coordination normal.  Skin: Skin is warm and dry. No rash noted. She is not diaphoretic. No erythema. No pallor.  Psychiatric: She has a normal mood and affect. Her behavior is normal. Judgment and thought content normal.          Assessment & Plan:

## 2013-11-11 ENCOUNTER — Other Ambulatory Visit (INDEPENDENT_AMBULATORY_CARE_PROVIDER_SITE_OTHER): Payer: Medicare Other

## 2013-11-11 ENCOUNTER — Telehealth: Payer: Self-pay | Admitting: *Deleted

## 2013-11-11 DIAGNOSIS — I509 Heart failure, unspecified: Secondary | ICD-10-CM

## 2013-11-11 DIAGNOSIS — I5033 Acute on chronic diastolic (congestive) heart failure: Secondary | ICD-10-CM

## 2013-11-11 LAB — BASIC METABOLIC PANEL
BUN: 55 mg/dL — ABNORMAL HIGH (ref 6–23)
CALCIUM: 9.1 mg/dL (ref 8.4–10.5)
CO2: 29 mEq/L (ref 19–32)
Chloride: 97 mEq/L (ref 96–112)
Creatinine, Ser: 3.3 mg/dL — ABNORMAL HIGH (ref 0.4–1.2)
GFR: 14.36 mL/min — AB (ref >60.00)
Glucose, Bld: 105 mg/dL — ABNORMAL HIGH (ref 70–99)
Potassium: 3.7 mEq/L (ref 3.5–5.1)
SODIUM: 136 meq/L (ref 135–145)

## 2013-11-11 NOTE — Telephone Encounter (Signed)
tonya from the lab calling a critical GFR   14.37

## 2013-11-11 NOTE — Telephone Encounter (Signed)
pt son notified to have pt hold only ONE DOSE of lasix, then resume current dose. [name] states they had to rsc Dr. Johnsie Cancel appt that was for 11/13/13, I said venous doppler and lab still scheduled, [name] said these were supposed to get changed as well.

## 2013-11-11 NOTE — Telephone Encounter (Signed)
Creatinine stable. Hold ONE dose of Lasix (only one dose). Repeat BMET in 1 week. Richardson Dopp, PA-C   11/11/2013 4:50 PM

## 2013-11-11 NOTE — Telephone Encounter (Signed)
GFR of 14.36  Message forwarded to [employer] as complete BMP is in pt chart Horton Chin RN

## 2013-11-12 ENCOUNTER — Other Ambulatory Visit: Payer: Medicare Other

## 2013-11-12 DIAGNOSIS — R0602 Shortness of breath: Secondary | ICD-10-CM | POA: Insufficient documentation

## 2013-11-12 DIAGNOSIS — R911 Solitary pulmonary nodule: Secondary | ICD-10-CM | POA: Insufficient documentation

## 2013-11-12 NOTE — Assessment & Plan Note (Signed)
Probably multifactorial. Will start to delineate pulmonary cause for dyspnea  PLAN  #Shortness of breath  - do ONO Test on Room Air ASAP  - do PFT breathing test next several days/few weeks - do duplex Lower Extremity rule out DVT  #Followup  - next few weeks after you complete tests in the dyspnea section  - bring all all your medicines for med calendar with NP Tammy  - Tammy will also review your test results

## 2013-11-12 NOTE — Assessment & Plan Note (Signed)
#  Pneumonia and lung nodule on CT  - repeat CT chest wo contrast in 2 months

## 2013-11-13 ENCOUNTER — Ambulatory Visit: Payer: Medicare Other | Admitting: Cardiovascular Disease

## 2013-11-13 ENCOUNTER — Encounter (HOSPITAL_COMMUNITY): Payer: Medicare Other

## 2013-11-13 ENCOUNTER — Other Ambulatory Visit: Payer: Medicare Other

## 2013-11-19 ENCOUNTER — Encounter (HOSPITAL_COMMUNITY): Payer: Medicare Other

## 2013-11-19 ENCOUNTER — Other Ambulatory Visit: Payer: Medicare Other

## 2013-11-22 ENCOUNTER — Other Ambulatory Visit: Payer: Self-pay | Admitting: *Deleted

## 2013-11-22 ENCOUNTER — Encounter: Payer: Self-pay | Admitting: Cardiology

## 2013-11-22 ENCOUNTER — Ambulatory Visit (HOSPITAL_COMMUNITY): Payer: Medicare Other | Attending: Cardiology | Admitting: Cardiology

## 2013-11-22 ENCOUNTER — Other Ambulatory Visit (INDEPENDENT_AMBULATORY_CARE_PROVIDER_SITE_OTHER): Payer: Medicare Other

## 2013-11-22 DIAGNOSIS — I5033 Acute on chronic diastolic (congestive) heart failure: Secondary | ICD-10-CM

## 2013-11-22 DIAGNOSIS — E876 Hypokalemia: Secondary | ICD-10-CM

## 2013-11-22 DIAGNOSIS — R609 Edema, unspecified: Secondary | ICD-10-CM | POA: Insufficient documentation

## 2013-11-22 DIAGNOSIS — I509 Heart failure, unspecified: Secondary | ICD-10-CM

## 2013-11-22 DIAGNOSIS — R0602 Shortness of breath: Secondary | ICD-10-CM

## 2013-11-22 LAB — BASIC METABOLIC PANEL
BUN: 47 mg/dL — ABNORMAL HIGH (ref 6–23)
CHLORIDE: 98 meq/L (ref 96–112)
CO2: 30 mEq/L (ref 19–32)
Calcium: 9.5 mg/dL (ref 8.4–10.5)
Creatinine, Ser: 2.9 mg/dL — ABNORMAL HIGH (ref 0.4–1.2)
GFR: 16.86 mL/min — ABNORMAL LOW (ref >60.00)
Glucose, Bld: 94 mg/dL (ref 70–99)
POTASSIUM: 2.9 meq/L — AB (ref 3.5–5.1)
Sodium: 139 mEq/L (ref 135–145)

## 2013-11-22 NOTE — Progress Notes (Signed)
Venous duplex completed 

## 2013-11-25 ENCOUNTER — Other Ambulatory Visit: Payer: Medicare Other

## 2013-11-27 ENCOUNTER — Ambulatory Visit (INDEPENDENT_AMBULATORY_CARE_PROVIDER_SITE_OTHER): Payer: Self-pay | Admitting: Family

## 2013-11-27 ENCOUNTER — Encounter: Payer: Self-pay | Admitting: Family

## 2013-11-27 ENCOUNTER — Telehealth: Payer: Self-pay | Admitting: Internal Medicine

## 2013-11-27 VITALS — BP 132/67 | HR 61 | Temp 97.0°F | Resp 14 | Ht 67.0 in | Wt 208.0 lb

## 2013-11-27 DIAGNOSIS — Z5189 Encounter for other specified aftercare: Secondary | ICD-10-CM

## 2013-11-27 NOTE — Telephone Encounter (Signed)
Despite saying fu next few weeks for results review and med calendar with NP, I do not see this appt  Duplex LE - negative for DVT   ONO 11/25/13 shows pulse ox < 88% at 38min and 20 sec. Tammy can start her on 1L o2 at night  She should see Tammy afte rPFT

## 2013-11-27 NOTE — Progress Notes (Signed)
    Established Dialysis Access  History of Present Illness  [name] is a 79 y.o. ([birth year]) female patient of Dr. Trula Slade who is s/p 2nd stage right BVT 09/20/13. She has done quite well since surgery. She states she has an area on one of the incisions that is an open sore. She states that she was getting out of the car during the ice 2-3 weeks ago and had to grad the door and may have hit her arm then.   She is not on HD yet. This was debrided by Dr. Trula Slade on 11/04/2013.   She returns today as requested by Dr. Marval Regal re concerns about swelling around the dried healing shallow wound in her right upper arm. Pt states he prescribed an antibiotic for her today. She denies fever or chills, denies purulent drainage, denies pain.  Patient states that the wound is getting smaller. Her son has been dressing right upper arm open incision area twice daily, wet to dry dressings as instructed when she was here 2-3 weeks ago. She finished the Avalox.  She reports that a steroid injection in her right wrist helped the wrist pain and tingling in her right hand she was having.   The patient's PMH, PSH, SH, FamHx, Med, and Allergies are unchanged from 06/10/2013.  On ROS today: see HPI for pertinent positives and negatives.  Physical Examination  Filed Vitals:   11/27/13 1615  BP: 132/67  Pulse: 61  Temp: 97 F (36.1 C)  TempSrc: Oral  Resp: 14  Height: 5\' 7"  (1.702 m)  Weight: 208 lb (94.348 kg)  SpO2: 98%   Body mass index is 32.57 kg/(m^2).  General: A&O x 3, WD, obese female.  Pulmonary: Respirations are non labored.  Cardiac: RRR. 2 Vascular: Vessel Right Left  Radial Palpable Palpable  Ulnar Palpable Not Palpable  Brachial Palpable Palpable    Musculoskeletal:  Extremities without  ischemic changes,  palpable thrill in access in right upper arm access.  Medical Decision Making  [name] is a 78 y.o. female who  returns today as requested by Dr.  Marval Regal re concerns about swelling around the dried healing shallow wound in her right upper arm. There is increased swelling with minimal redness around the dried shallow wound that is decreasing in size, granulating, since she was here 2-3 weeks ago. No purulence, no fever or chills.  Based on today's exam, and after discussing with Dr. Kellie Simmering, pt to continue twice daily wet to dry dressing changes, take the antibiotic prescribed by Dr. Marval Regal, and follow up with Dr. Trula Slade as scheduled, May 4. She knows to return in sooner if needed.  Clemon Chambers, RN, MSN, FNP-C [facility] Office: [phone]  Clinic MD: Kellie Simmering on call  11/27/2013, 4:32 PM

## 2013-11-28 ENCOUNTER — Other Ambulatory Visit: Payer: Medicare Other

## 2013-11-28 NOTE — Telephone Encounter (Signed)
Spoke with pt Son. Appt scheduled to see TP same day as PFT for med calendar. He is aware pt needs to bring ALL medications. Nothing further needed

## 2013-12-05 ENCOUNTER — Ambulatory Visit (INDEPENDENT_AMBULATORY_CARE_PROVIDER_SITE_OTHER): Payer: Medicare Other | Admitting: Cardiovascular Disease

## 2013-12-05 ENCOUNTER — Encounter: Payer: Self-pay | Admitting: Cardiovascular Disease

## 2013-12-05 VITALS — BP 143/72 | HR 63 | Resp 12 | Ht 67.0 in | Wt 204.0 lb

## 2013-12-05 DIAGNOSIS — I4891 Unspecified atrial fibrillation: Secondary | ICD-10-CM

## 2013-12-05 DIAGNOSIS — I10 Essential (primary) hypertension: Secondary | ICD-10-CM

## 2013-12-05 DIAGNOSIS — N259 Disorder resulting from impaired renal tubular function, unspecified: Secondary | ICD-10-CM

## 2013-12-05 DIAGNOSIS — I2510 Atherosclerotic heart disease of native coronary artery without angina pectoris: Secondary | ICD-10-CM

## 2013-12-05 NOTE — Patient Instructions (Signed)
Your physician recommends that you continue on your current medications as directed. Please refer to the Current Medication list given to you today.  Your physician recommends that you schedule a follow-up appointment in: 3 months with Dr.Nishan  

## 2013-12-05 NOTE — Assessment & Plan Note (Signed)
Good rate control with pacer back up

## 2013-12-05 NOTE — Progress Notes (Signed)
Patient ID: [name], female   DOB: [birth year], 79 y.o.   MRN: [id number] [name] is a 79 y.o. female with a hx of CAD, s/p DES to the OM in 6/06, rotational atherectomy + DES to pLAD in 7/09, symptomatic bradycardia s/p pacemaker with upgrade to CRT-P 11/08, AFib s/p AVN ablation (prior amiodarone thyroiditis), chronic diastolic CHF, mitral regurgitation, CKD stage IV, HTN, HL, GERD, prior UGI bleed in 2008 related to NSAID use, PVD. She has had a difficult time with access for hemodialysis. She had a brachial vein transposition in 09/2013.  She was seen in 06/2013 with worsening chest pain consistent with CCS class III angina. She was ultimately set up for cardiac catheterization that demonstrated patent stent in the LAD and obtuse marginal 1 with nonobstructive disease elsewhere. Medical therapy was continued.  She was admitted in 07/2013 with progressive dysphagia. EGD demonstrated large prepyloric ulcer and polyp which was removed. She had esophageal dilatation.  Admitted in 09/2013 with a/c renal failure in the setting of nausea, vomiting and anorexia with poor by mouth intake.  I saw her last week. She had a cough and worsening dyspnea. Chest CT recently demonstrated evidence of bilateral infiltrates. I felt she had evidence of acute on chronic diastolic CHF in the setting of community acquired pneumonia and CKD. I adjusted her diuretics. I placed her on Avelox for treatment of her pneumonia. She was brought back today for close followup.  She is feeling better. Her cough is improved. Her breathing is improved. Her weight at home is down 7 pounds. She sleeps on one pillow. LE edema is fairly stable. Increased abdominal girth is improved. She is not yet quite back to her baseline.  Studies:  - Myoview (01/2009): EF 79%, no scar or ischemia.  - Echo (03/2013): Mild focal basal septal hypertrophy, EF 55-60%, mild MR, moderate to severe LAE, mild to moderate RVE, reduced RVSF, moderate RAE.   - LHC (07/14/13): pLAD stent ok, LAD 30-40, ostial first septal perf 80, OM1 stent ok with 20 ISR, AVCFX 20-30, prox to mid RCA 30-40. Med Rx.  Recent Labs:  10/27/2013: ALT 11; Creatinine 2.68*; Hemoglobin 12.7; Potassium 4.6  10/28/2013: Pro B Natriuretic peptide (BNP) 1368.0*  10/27/2013: POC INR 2.16*; WBC 7.1  Abdominal CT (10/2013):  IMPRESSION: Scattered hepatic cysts measuring up to 17 mm. Liver is otherwise unremarkable on unenhanced CT. 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 Mild patchy/ground-glass opacity in the right middle lobe, suspicious for pneumonia. Trace right pleural effusion. 11 x 7 mm patchy/nodular opacity at the left lung base, possibly infectious/inflammatory, although indeterminate. Consider follow-up CT chest in 3 months to assess for persistence.  Wt Readings from Last 3 Encounters:   11/07/13  208 lb (94.348 kg)   11/04/13  206 lb 1.6 oz (93.486 kg)   11/01/13  208 lb (94.348 kg)    Some nausea and uremic symptoms  RUE fistula slowly healing with wet to dries  On antibiotics for arm as well    ROS: Denies fever, malais, weight loss, blurry vision, decreased visual acuity, cough, sputum, SOB, hemoptysis, pleuritic pain, palpitaitons, heartburn, abdominal pain, melena, lower extremity edema, claudication, or rash.  All other systems reviewed and negative  General: Affect appropriate Chronically ill white female  HEENT: normal Neck supple with no adenopathy JVP normal no bruits no thyromegaly Lungs clear with no wheezing and good diaphragmatic motion Heart:  S1/S2 no murmur, no rub, gallop or click  PMI normal Abdomen: benighn, BS positve, no tenderness, no AAA no bruit.  No HSM or HJR Distal pulses intact with no bruits Plus 2 lower extremity edema Neuro non-focal Skin warm and dry No muscular weakness RUE fistula with good thrill    Current Outpatient Prescriptions   Medication Sig Dispense Refill  . ALPRAZolam (XANAX) 0.5 MG tablet Take 0.5 mg by mouth 3 (three) times daily as needed for anxiety.       . cephALEXin (KEFLEX) 500 MG capsule Take 500 mg by mouth 4 (four) times daily.      . furosemide (LASIX) 40 MG tablet 40 mg twice daily      . hydrALAZINE (APRESOLINE) 25 MG tablet Take 25 mg by mouth 3 (three) times daily.      . isosorbide mononitrate (IMDUR) 60 MG 24 hr tablet Take 60 mg by mouth daily.       Marland Kitchen lovastatin (MEVACOR) 10 MG tablet Take 10 mg by mouth at bedtime.       . magnesium oxide (MAG-OX) 400 MG tablet Take 400 mg by mouth at bedtime.       . metoprolol tartrate (LOPRESSOR) 25 MG tablet Take 1 tablet (25 mg total) by mouth 2 (two) times daily.  60 tablet  0  . nitroGLYCERIN (NITROSTAT) 0.4 MG SL tablet Place 0.4 mg under the tongue every 5 (five) minutes as needed for chest pain.      Marland Kitchen ondansetron (ZOFRAN) 4 MG tablet Take 1 tablet (4 mg total) by mouth every 8 (eight) hours as needed for nausea or vomiting.  40 tablet  2  . oxyCODONE-acetaminophen (PERCOCET/ROXICET) 5-325 MG per tablet Take 1 tablet by mouth 3 (three) times daily as needed for moderate pain. For pain  30 tablet  0  . pantoprazole (PROTONIX) 40 MG tablet Take 40 mg by mouth 2 (two) times daily.       . potassium chloride SA (K-DUR,KLOR-CON) 20 MEQ tablet 2 tablets (40 mEq) twice daily      . warfarin (COUMADIN) 2 MG tablet Take 2-3 mg by mouth daily at 6 PM. Takes 1 tablets (3mg ) on Mondays and Fridays.  Takes 1 tablet (2mg ) on all remaining days.      . [DISCONTINUED] FLUoxetine (PROZAC) 20 MG capsule Take 20 mg by mouth daily.         No current facility-administered medications for this visit.    Allergies  Amiodarone and Propylthiouracil  Electrocardiogram:  3/26  afib V pacing rate 62   Assessment and Plan

## 2013-12-05 NOTE — Assessment & Plan Note (Signed)
Mild uremic symptoms  Fistula healing Still urinating at some point will need dialysis to control volume  Continue current bid lasix Diastolic dysfunction exacerbated by CRF  F/U Dr Azzie Roup

## 2013-12-05 NOTE — Assessment & Plan Note (Signed)
Well controlled.  Continue current medications and low sodium Dash type diet.    

## 2013-12-05 NOTE — Assessment & Plan Note (Signed)
Stable with no angina and good activity level.  Continue medical Rx Myovue normal 11/14

## 2013-12-09 ENCOUNTER — Other Ambulatory Visit: Payer: Self-pay

## 2013-12-09 DIAGNOSIS — I5033 Acute on chronic diastolic (congestive) heart failure: Secondary | ICD-10-CM

## 2013-12-09 MED ORDER — FUROSEMIDE 40 MG PO TABS: ORAL_TABLET | ORAL | Status: DC

## 2013-12-09 MED ORDER — HYDRALAZINE HCL 25 MG PO TABS: 25.0000 mg | ORAL_TABLET | Freq: Three times a day (TID) | ORAL | Status: DC

## 2013-12-10 ENCOUNTER — Other Ambulatory Visit: Payer: Self-pay | Admitting: Physician Assistant

## 2013-12-13 ENCOUNTER — Encounter: Payer: Self-pay | Admitting: Surgery

## 2013-12-16 ENCOUNTER — Ambulatory Visit (INDEPENDENT_AMBULATORY_CARE_PROVIDER_SITE_OTHER): Payer: Self-pay | Admitting: Surgery

## 2013-12-16 ENCOUNTER — Encounter: Payer: Self-pay | Admitting: Surgery

## 2013-12-16 VITALS — BP 141/58 | HR 59 | Ht 67.0 in | Wt 204.0 lb

## 2013-12-16 DIAGNOSIS — N186 End stage renal disease: Secondary | ICD-10-CM

## 2013-12-16 NOTE — Progress Notes (Signed)
The patient comes in today for a wound check.  She is status post a second stage right basilic vein transposition on 09/20/2013.  She bumped her  arm postoperatively and developed a wound which has been slow to heal.  There is a persistent area with a dry eschar on the distal vein harvest incision.  There is an excellent thrill within her fistula.  I debrided the eschar today and then cauterized the 1 cm x 2 mm area with silver nitrate.  The patient will followup in one month for a wound check.

## 2013-12-26 ENCOUNTER — Encounter: Payer: Self-pay | Admitting: Adult Health

## 2013-12-26 ENCOUNTER — Ambulatory Visit (INDEPENDENT_AMBULATORY_CARE_PROVIDER_SITE_OTHER): Payer: Medicare Other | Admitting: Internal Medicine

## 2013-12-26 ENCOUNTER — Ambulatory Visit (INDEPENDENT_AMBULATORY_CARE_PROVIDER_SITE_OTHER): Payer: Medicare Other | Admitting: Adult Health

## 2013-12-26 VITALS — BP 142/76 | HR 60 | Temp 98.2°F | Ht 64.0 in | Wt 204.0 lb

## 2013-12-26 DIAGNOSIS — R0602 Shortness of breath: Secondary | ICD-10-CM

## 2013-12-26 LAB — PULMONARY FUNCTION TEST
DL/VA % pred: 130 %
DL/VA: 6.56 ml/min/mmHg/L
DLCO UNC % PRED: 71 %
DLCO UNC: 19.25 ml/min/mmHg
FEF 25-75 Post: 2.02 L/sec
FEF 25-75 Pre: 1.52 L/sec
FEF2575-%Change-Post: 33 %
FEF2575-%Pred-Post: 121 %
FEF2575-%Pred-Pre: 90 %
FEV1-%Change-Post: 7 %
FEV1-%PRED-POST: 71 %
FEV1-%Pred-Pre: 66 %
FEV1-Post: 1.59 L
FEV1-Pre: 1.48 L
FEV1FVC-%Change-Post: 5 %
FEV1FVC-%Pred-Pre: 107 %
FEV6-%Change-Post: 3 %
FEV6-%PRED-PRE: 63 %
FEV6-%Pred-Post: 65 %
FEV6-POST: 1.86 L
FEV6-Pre: 1.8 L
FEV6FVC-%CHANGE-POST: 3 %
FEV6FVC-%PRED-POST: 105 %
FEV6FVC-%Pred-Pre: 101 %
FVC-%CHANGE-POST: 1 %
FVC-%PRED-POST: 63 %
FVC-%Pred-Pre: 62 %
FVC-PRE: 1.86 L
FVC-Post: 1.89 L
POST FEV6/FVC RATIO: 100 %
PRE FEV1/FVC RATIO: 80 %
PRE FEV6/FVC RATIO: 97 %
Post FEV1/FVC ratio: 84 %
RV % PRED: 119 %
RV: 2.92 L
TLC % PRED: 93 %
TLC: 5.01 L

## 2013-12-26 NOTE — Patient Instructions (Signed)
Follow med calendar closely and bring to each visit.  May try gas x before meal , eat small meals.  follow up for CT as planned and As needed   follow up Dr. Chase Caller in 4 weeks and As needed

## 2013-12-26 NOTE — Progress Notes (Signed)
PFT done today. 

## 2013-12-30 NOTE — Progress Notes (Signed)
Subjective:    Patient ID: [name], female    DOB: 11-17-1934, 79 y.o.   MRN: [id number] PCP Alexandria Roers, MD Referred bu  [name] HPI  [name] [name] is a 79 y.o. female with a hx of CAD,  reports that she quit smoking about 18 years ago.  She has a 50 pack-year smoking history.s/p DES to the OM in 6/06, rotational atherectomy + DES to pLAD in 7/09, symptomatic bradycardia s/p pacemaker with upgrade to CRT-P 11/08, AFib s/p AVN ablation (prior amiodarone thyroiditis), chronic diastolic CHF, mitral regurgitation, CKD stage IV, HTN, HL, GERD, prior UGI bleed in 2008 related to NSAID use, PVD. She has had a difficult time with access for hemodialysis. She had a brachial vein transposition in 09/2013.   She was seen in 06/2013 with worsening chest pain consistent with CCS class III angina. She was ultimately set up for cardiac catheterization that demonstrated patent stent in the LAD and obtuse marginal 1 with nonobstructive disease elsewhere. Medical therapy was continued.  She was admitted in 07/2013 with progressive dysphagia. EGD demonstrated large prepyloric ulcer and polyp which was removed. She had esophageal dilatation.   Admitted in 09/2013 with acute on chronic renal failure in the setting of nausea, vomiting and anorexia with poor by mouth intake. Last seen by [name] 10/2013. She was recently seen by gastroenterology with persistent nausea as well as abdominal bloating, progressive dyspnea and orthopnea. Abdominal CT demonstrated groundglass opacity in the right middle lobe suspicious for pneumonia with trace effusion as well as nodular opacity in the left lung base suspicious for an infectious/inflammatory process, scattered hepatic cysts measuring up to 17 mm, findings consistent with polycystic kidney disease. No ascites was noted. Liver size was normal. BNP remained elevated at 1368.    She presented to cardiology in 11/01/2013 and she reported  She has had significant  cough over the last couple of weeks. She described yellow sputum. She notes poor appetite. She sometimes coughs hard enough to vomit. She also has vomiting with certain meals. She denies hemoptysis. She denies significant fever. She feels weak. She notes increased shortness of breath. She probably described NYHA class III symptoms. She denies orthopnea. She has noted PND as well as edema.   At this time CT scan of the abdomen lung cut showed findings of possible pneumonia [see below]. She was then discharged with 7 days of [name]. And she was referred to pulmonary. Today 11/08/2013 she presents for pulmonary followup in consultation. She reports that her cough is significantly improved at least over 70% since starting antibiotics. There is some residual cough with mild associated postnasal drainage. However, her main concern is dyspnea since December 2014 as of insidious onset and slowly progressive. Probably [place] Heart Association class III. Relieved by rest. The last several weeks his increasing edema.    12/26/13 Follow up  Returns for follow up  Returns for follow up and med review .  We reviewed all meds and organized them into a med calendar with pt education.  Had PFT today >PFTs showed FEV1 of 1.48 L, 66% of predicted, ratio of 80, no significant bronchodilator response,  diffusing capacity 71%, FVC 62% Has upcoming CT next weeek.  Does complains of bloating in stomach and no appetite .  No chest pain, orthopnea or edema, fever, or vomiting.  Says her breathing is not to bad, does get winded at times but main issue she feels is her stomach.    Studies:  -  Myoview (01/2009): EF 79%, no scar or ischemia.  - Echo (03/2013): Mild focal basal septal hypertrophy, EF 55-60%, mild MR, moderate to severe LAE, mild to moderate RVE, reduced RVSF, moderate RAE.  - LHC (07/14/13): pLAD stent ok, LAD 30-40, ostial first septal perf 80, OM1 stent ok with 20 ISR, AVCFX 20-30, prox to mid RCA 30-40. Med  Rx.  Recent Labs:  10/27/2013: ALT 11; Creatinine 2.68*; Hemoglobin 12.7; Potassium 4.6  10/28/2013: Pro B Natriuretic peptide (BNP) 1368.0*  10/27/2013: POC INR 2.16*; WBC 7.1  Abdominal CT (10/2013):  IMPRESSION: Scattered hepatic cysts measuring up to 17 mm. Liver is otherwise unremarkable on unenhanced CT. 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 Mild patchy/ground-glass opacity in the right middle lobe, suspicious for pneumonia. Trace right pleural effusion. 11 x 7 mm patchy/nodular opacity at the left lung base, possibly infectious/inflammatory, although indeterminate. Consider follow-up CT chest in 3 months to assess for persistence.       Review of Systems  Constitutional:   No  weight loss, night sweats,  Fevers, chills,  +fatigue, or  lassitude.  HEENT:   No headaches,  Difficulty swallowing,  Tooth/dental problems, or  Sore throat,                No sneezing, itching, ear ache, nasal congestion, post nasal drip,   CV:  No chest pain,  Orthopnea, PND, swelling in lower extremities, anasarca, dizziness, palpitations, syncope.   GI  No   bloody stools.   Resp:  No chest wall deformity  Skin: no rash or lesions.  [name]: no dysuria, change in color of urine, no urgency or frequency.  No flank pain, no hematuria   MS:  No joint pain or swelling.  No decreased range of motion.  No back pain.  Psych:  No change in mood or affect. No depression or anxiety.  No memory loss.    '    Objective:   Physical Exam  GEN: A/Ox3; pleasant , NAD, elderly   HEENT:  [place]/AT,  EACs-clear, TMs-wnl, NOSE-clear, THROAT-clear, no lesions, no postnasal drip or exudate noted.   NECK:  Supple w/ fair ROM; no JVD; normal carotid impulses w/o bruits; no thyromegaly or nodules palpated; no lymphadenopathy.  RESP  Diminished bS in bases  .no accessory muscle use, no dullness to percussion  CARD:  RRR, no m/r/g  , no  peripheral edema, pulses intact, no cyanosis or clubbing.  GI:   Soft & nt; nml bowel sounds; no organomegaly or masses detected.  Musco: Warm bil, no deformities or joint swelling noted.   Neuro: alert, no focal deficits noted.    Skin: Warm, no lesions or rashes       Assessment & Plan:

## 2013-12-30 NOTE — Assessment & Plan Note (Addendum)
Dyspnea ? Etiology , suspect is multifactoral - CT chest pending  Has some restrictive changes , no airflow obstruction, no sign change w/ BD , DLCO min decreased.  Will awaiting CT to look for ILD   Plan  Follow med calendar closely and bring to each visit.  May try gas x before meal , eat small meals.  follow up for CT as planned and As needed   follow up Dr. Chase Caller in 4 weeks and As needed

## 2014-01-01 ENCOUNTER — Encounter: Payer: Self-pay | Admitting: Internal Medicine

## 2014-01-03 NOTE — Addendum Note (Signed)
Addended by: Parke Poisson E on: 01/03/2014 09:49 AM   Modules accepted: Orders

## 2014-01-08 ENCOUNTER — Other Ambulatory Visit: Payer: Medicare Other

## 2014-01-16 ENCOUNTER — Encounter: Payer: Self-pay | Admitting: Internal Medicine

## 2014-01-16 ENCOUNTER — Ambulatory Visit (INDEPENDENT_AMBULATORY_CARE_PROVIDER_SITE_OTHER): Payer: Medicare Other | Admitting: Internal Medicine

## 2014-01-16 ENCOUNTER — Ambulatory Visit (INDEPENDENT_AMBULATORY_CARE_PROVIDER_SITE_OTHER)
Admission: RE | Admit: 2014-01-16 | Discharge: 2014-01-16 | Disposition: A | Discharge status: Home / Self Care | Payer: Medicare Other | Source: Ambulatory Visit | Attending: Internal Medicine | Admitting: Internal Medicine

## 2014-01-16 VITALS — BP 151/70 | HR 62 | Ht 67.0 in | Wt 200.0 lb

## 2014-01-16 DIAGNOSIS — Z950 Presence of cardiac pacemaker: Secondary | ICD-10-CM

## 2014-01-16 DIAGNOSIS — R911 Solitary pulmonary nodule: Secondary | ICD-10-CM

## 2014-01-16 DIAGNOSIS — I4891 Unspecified atrial fibrillation: Secondary | ICD-10-CM

## 2014-01-16 LAB — MDC_IDC_ENUM_SESS_TYPE_INCLINIC
Battery Remaining Longevity: 34 mo
Brady Statistic RV Percent Paced: 98 %
Implantable Pulse Generator Model: 8042
Lead Channel Impedance Value: 0 Ohm
Lead Channel Impedance Value: 482 Ohm
Lead Channel Pacing Threshold Amplitude: 1 V
Lead Channel Pacing Threshold Pulse Width: 0.4 ms
Lead Channel Pacing Threshold Pulse Width: 0.4 ms
Lead Channel Setting Pacing Amplitude: 2.5 V
Lead Channel Setting Pacing Pulse Width: 0.4 ms
Lead Channel Setting Pacing Pulse Width: 0.4 ms
Lead Channel Setting Sensing Sensitivity: 2.8 mV
MDC IDC MSMT BATTERY VOLTAGE: 2.94 V
MDC IDC MSMT LEADCHNL LV IMPEDANCE VALUE: 701 Ohm
MDC IDC MSMT LEADCHNL LV PACING THRESHOLD AMPLITUDE: 1 V
MDC IDC SESS DTM: 20150604120925
MDC IDC SET LEADCHNL LV PACING AMPLITUDE: 2 V

## 2014-01-16 NOTE — Assessment & Plan Note (Signed)
Her ventricular rates have been well controlled after AV node ablation. Will follow.

## 2014-01-16 NOTE — Progress Notes (Signed)
Quick Note:  Pt's son returned call. Spoke with Mortimer Fries and advised of CT results / recs as stated by MR. Mortimer Fries verbalized his understanding and denied any questions/concerns at this time. ______

## 2014-01-16 NOTE — Progress Notes (Signed)
HPI [name] returns today for followup.   She is a very pleasant 79 yo woman with a history of symptomatic bradycardia, status post pacemaker insertion, hypertension, chronic renal insufficiency with stage III renal failure, and arthritis. She has atrial fibrillation and underwent AV node ablation and pacemaker insertion over 6 years ago.in the interim, she has been bothered by peripheral edema. She has minimal shortness of breath. No chest pain. Allergies  Allergen Reactions  . Amiodarone Hives  . Propylthiouracil Rash     Current Outpatient Prescriptions  Medication Sig Dispense Refill  . ALPRAZolam (XANAX) 0.5 MG tablet Take 0.5 mg by mouth at bedtime.       Marland Kitchen aspirin 81 MG tablet Take 81 mg by mouth daily.      . diphenoxylate-atropine (LOMOTIL) 2.5-0.025 MG per tablet Take 1-2 tablets by mouth every 8 (eight) hours as needed for diarrhea or loose stools.      . furosemide (LASIX) 40 MG tablet 40 mg twice daily  60 tablet  6  . hydrALAZINE (APRESOLINE) 25 MG tablet Take 1 tablet (25 mg total) by mouth 3 (three) times daily.  84 tablet  6  . isosorbide mononitrate (IMDUR) 60 MG 24 hr tablet 2 tabs by mouth every morning      . lovastatin (MEVACOR) 10 MG tablet Take 10 mg by mouth at bedtime.       . magnesium oxide (MAG-OX) 400 MG tablet Take 400 mg by mouth at bedtime.       . metolazone (ZAROXOLYN) 2.5 MG tablet Take 2.5 mg by mouth daily as needed.      . metoprolol tartrate (LOPRESSOR) 25 MG tablet Take 1 tablet (25 mg total) by mouth 2 (two) times daily.  60 tablet  0  . mometasone (NASONEX) 50 MCG/ACT nasal spray Place 2 sprays into the nose daily as needed.      . nitroGLYCERIN (NITROSTAT) 0.4 MG SL tablet Place 0.4 mg under the tongue every 5 (five) minutes as needed for chest pain.      Marland Kitchen ondansetron (ZOFRAN) 4 MG tablet Take 1 tablet (4 mg total) by mouth every 8 (eight) hours as needed for nausea or vomiting.  40 tablet  2  . oxyCODONE-acetaminophen (PERCOCET/ROXICET)  5-325 MG per tablet Take 1 tablet by mouth every 6 (six) hours as needed for severe pain. For pain      . pantoprazole (PROTONIX) 40 MG tablet Take 40 mg by mouth 2 (two) times daily.       . potassium chloride SA (K-DUR,KLOR-CON) 20 MEQ tablet 2 tablets (40 mEq) twice daily      . warfarin (COUMADIN) 2 MG tablet Take 2-3 mg by mouth daily at 6 PM. Takes 1 tablets (3mg ) on Mondays and Fridays.  Takes 1 tablet (2mg ) on all remaining days.      . [DISCONTINUED] FLUoxetine (PROZAC) 20 MG capsule Take 20 mg by mouth daily.         No current facility-administered medications for this visit.     Past Medical History  Diagnosis Date  . Cellulitis and abscess of unspecified site     a. h/o MSRA cellulitis of abdominal wall.  Marland Kitchen CAD (coronary artery disease)     a. DES to OM 01/2005. b. Rotational atherotomy/DES to prox LAD 02/2008. c. Cath 07/2013: stable, for med rx.  . Edema   . Warfarin anticoagulation     a. Followed by PCP.  Marland Kitchen GI bleed     a. EGD 01/2007:  antral ulcers related to NSAID/aspirin use.  . Peripheral vascular disease   . Hyperthyroidism     a. Hx of intolerance to PTU.  Marland Kitchen Blood transfusion years ago    "heart problems" (09/26/2013)  . H/O hiatal hernia   . Umbilical hernia     unrepaired (11/24/11)  . Valvular heart disease     Echo 11/2011: mild-mod MR.  Marland Kitchen Hypertension     takes Amlodipine,Hydralazine,and Imdur daily  . Hyperlipidemia     takes Lovastatin daily  . SSS (sick sinus syndrome)     a. H/o AV node ablation 2008 secondary to difficult to control afib/flutter, with subsequent pacer.  . Atrial fibrillation with rapid ventricular response     a. H/o amiodarone thyroiditis. b. s/p AV node ablation 2008 with implantation of PPM 01/2007, upgraded to CRT-P 06/2007.;takes Coumadin daily  . GERD (gastroesophageal reflux disease)     takes Protonix daily  . Chronic diastolic CHF (congestive heart failure)     takes Lasix daily and Spironolactone   . Pacemaker      Medtronic  . History of gout   . Bruises easily   . Hemorrhoids   . Depression     takes Xanax daily  . Insomnia     takes Xanax if needed  . Heart murmur   . Chronic renal insufficiency     Cr ~2.3.  . Polycystic kidney disease     a. Per remote E-Chart records.  . Kidney stones   . Myocardial infarction     "I've had 2-3; last one was in /1996" (09/26/2013)  . Exertional shortness of breath   . Migraines     "last one was a pretty good while ago" (09/26/2013)  . Arthritis     "all over" (09/26/2013)  . Chronic lower back pain     deteriorating disc  . Anxiety   . Iron deficiency anemia     takes an Iron Pill daily  . Pneumonia     ROS:   All systems reviewed and negative except as noted in the HPI.   Past Surgical History  Procedure Laterality Date  . Esophagogastroduodenoscopy    . Tonsillectomy  ~ 1960  . Laminectomy and microdiscectomy lumbar spine  08/2003  . Cardioversion  10/2004; 02/2007    /E-chart  . Cholecystectomy    . Appendectomy  1954  . Vaginal hysterectomy  ?1981  . Av node ablation  02/2007    /E-chart  . Insert / replace / remove pacemaker  01/2007    initial placement  . Insert / replace / remove pacemaker  06/2007    upgraded/E-chart  . Av fistula placement, brachiocephalic Right 01/6439    "didn't work; my veins were too small"  . Joint replacement    . Total knee arthroplasty Left 01/2009  . Bladder and bowel  1970's    "tacked; damaged my intestines & had to remove some; done @ [facility]  . Cataract extraction w/ intraocular lens  implant, bilateral Bilateral   . Bascilic vein transposition Right 06/21/2013    Procedure: [place];  Surgeon: Serafina Mitchell, MD;  Location: [facility] OR;  Service: Vascular;  Laterality: Right;  . Esophagogastroduodenoscopy N/A 07/19/2013    Procedure: ESOPHAGOGASTRODUODENOSCOPY (EGD);  Surgeon: Lafayette Dragon, MD;  Location: [facility] ENDOSCOPY;  Service: Endoscopy;  Laterality: N/A;  . Balloon  dilation N/A 07/19/2013    Procedure: BALLOON DILATION;  Surgeon: Lafayette Dragon, MD;  Location: [facility] ENDOSCOPY;  Service: Endoscopy;  Laterality: N/A;  . Esophagogastroduodenoscopy N/A 07/20/2013    Procedure: ESOPHAGOGASTRODUODENOSCOPY (EGD);  Surgeon: Lafayette Dragon, MD;  Location: [facility] ENDOSCOPY;  Service: Endoscopy;  Laterality: N/A;  with removal of polyp in pylorus  . Bascilic vein transposition Right 09/20/2013    Procedure: 2ND STAGE RIGHT BASCILIC VEIN TRANSPOSITION; ULTRSOUND GUIDED;  Surgeon: Serafina Mitchell, MD;  Location: [facility] OR;  Service: Vascular;  Laterality: Right;  . Cardiac catheterization  2006; 2008; 2009; 01/13/2011  . Coronary angioplasty with stent placement  ~ 2007; ?    "had a total of 2 stents put in" 92/07/2014)  . Back surgery       Family History  Problem Relation Age of Onset  . Colon cancer Neg Hx   . Thyroid disease Neg Hx   . Stroke Brother   . Hypertension Brother   . Kidney disease Brother   . Kidney disease Brother   . Other Mother     Sepsis and perforated colon  . Kidney disease Mother   . Other Father     Motor vehicle accident  . Learning disabilities Son   . Kidney disease Maternal Aunt   . Kidney disease Maternal Grandmother      History   Social History  . Marital Status: Widowed    Spouse Name: N/A    Number of Children: N/A  . Years of Education: N/A   Occupational History  . Not on file.   Social History Main Topics  . Smoking status: Former Smoker -- 1.00 packs/day for 50 years    Types: Cigarettes    Quit date: 07/13/1995  . Smokeless tobacco: Never Used     Comment: quit smoking around 1996  . Alcohol Use: No  . Drug Use: No  . Sexual Activity: No   Other Topics Concern  . Not on file   Social History Narrative   Lives in [place].     BP 151/70  Pulse 62  Ht 5\' 7"  (1.702 m)  Wt 200 lb (90.719 kg)  BMI 31.32 kg/m2  Physical Exam:  cronically ill appearing 79 yo woman,elderly woman,NAD HEENT:  Unremarkable Neck:  7 cm JVD, no thyromegally Back:  No CVA tenderness Lungs:  Clear except for scattered basilar rales. No wheezes. No rhonchi. HEART:  Regular rate rhythm, no murmurs, no rubs, no clicks Abd:  soft, positive bowel sounds, no organomegally, no rebound, no guarding Ext:  2 plus pulses, 1+ bilaterally edema, no cyanosis, no clubbing Skin:  No rashes no nodules Neuro:  CN II through XII intact, motor grossly intact   DEVICE  Normal device function.  See PaceArt for details.   Assess/Plan:

## 2014-01-16 NOTE — Patient Instructions (Signed)
Your physician wants you to follow-up in: 6 months in the device clinic and 12 months with Dr Knox Saliva will receive a reminder letter in the mail two months in advance. If you don't receive a letter, please call our office to schedule the follow-up appointment.

## 2014-01-16 NOTE — Assessment & Plan Note (Signed)
Her medtronic BiV PPM is working normally. Will recheck in several months. 

## 2014-01-17 ENCOUNTER — Encounter: Payer: Self-pay | Admitting: Surgery

## 2014-01-20 ENCOUNTER — Encounter: Payer: Self-pay | Admitting: Surgery

## 2014-01-20 ENCOUNTER — Ambulatory Visit (INDEPENDENT_AMBULATORY_CARE_PROVIDER_SITE_OTHER): Payer: Medicare Other | Admitting: Surgery

## 2014-01-20 VITALS — BP 142/56 | HR 64 | Temp 97.8°F | Resp 18 | Wt 199.0 lb

## 2014-01-20 DIAGNOSIS — N186 End stage renal disease: Secondary | ICD-10-CM

## 2014-01-20 NOTE — Progress Notes (Signed)
Patient name: [name] MRN: [id number] DOB: 01-Aug-1935 Sex: female     Chief Complaint  Patient presents with  . Routine Post Op    Rt arm BVT follow up from 09-20-13    HISTORY OF PRESENT ILLNESS: The patient is here today for followup.  On 09/20/2013, she underwent second stage basilic vein transposition.  During her postoperative course, she fell and developed a slight separation within her wound.  Last time I saw her I cauterized it with silver nitrate.  She is back today for followup.  She is not yet on dialysis.  Past Medical History  Diagnosis Date  . Cellulitis and abscess of unspecified site     a. h/o MSRA cellulitis of abdominal wall.  Marland Kitchen CAD (coronary artery disease)     a. DES to OM 01/2005. b. Rotational atherotomy/DES to prox LAD 02/2008. c. Cath 07/2013: stable, for med rx.  . Edema   . Warfarin anticoagulation     a. Followed by PCP.  Marland Kitchen GI bleed     a. EGD 01/2007: antral ulcers related to NSAID/aspirin use.  . Peripheral vascular disease   . Hyperthyroidism     a. Hx of intolerance to PTU.  Marland Kitchen Blood transfusion years ago    "heart problems" (09/26/2013)  . H/O hiatal hernia   . Umbilical hernia     unrepaired (11/24/11)  . Valvular heart disease     Echo 11/2011: mild-mod MR.  Marland Kitchen Hypertension     takes Amlodipine,Hydralazine,and Imdur daily  . Hyperlipidemia     takes Lovastatin daily  . SSS (sick sinus syndrome)     a. H/o AV node ablation 2008 secondary to difficult to control afib/flutter, with subsequent pacer.  . Atrial fibrillation with rapid ventricular response     a. H/o amiodarone thyroiditis. b. s/p AV node ablation 2008 with implantation of PPM 01/2007, upgraded to CRT-P 06/2007.;takes Coumadin daily  . GERD (gastroesophageal reflux disease)     takes Protonix daily  . Chronic diastolic CHF (congestive heart failure)     takes Lasix daily and Spironolactone   . Pacemaker     [employer]  . History of gout   . Bruises easily   .  Hemorrhoids   . Depression     takes Xanax daily  . Insomnia     takes Xanax if needed  . Heart murmur   . Chronic renal insufficiency     Cr ~2.3.  . Polycystic kidney disease     a. Per remote E-Chart records.  . Kidney stones   . Myocardial infarction     "I've had 2-3; last one was in /1996" (09/26/2013)  . Exertional shortness of breath   . Migraines     "last one was a pretty good while ago" (09/26/2013)  . Arthritis     "all over" (09/26/2013)  . Chronic lower back pain     deteriorating disc  . Anxiety   . Iron deficiency anemia     takes an Iron Pill daily  . Pneumonia     Past Surgical History  Procedure Laterality Date  . Esophagogastroduodenoscopy    . Tonsillectomy  ~ 1960  . Laminectomy and microdiscectomy lumbar spine  08/2003  . Cardioversion  10/2004; 02/2007    /E-chart  . Cholecystectomy    . Appendectomy  1954  . Vaginal hysterectomy  ?1981  . Av node ablation  02/2007    /E-chart  . Insert / replace / remove  pacemaker  01/2007    initial placement  . Insert / replace / remove pacemaker  06/2007    upgraded/E-chart  . Av fistula placement, brachiocephalic Right 0/9604    "didn't work; my veins were too small"  . Joint replacement    . Total knee arthroplasty Left 01/2009  . Bladder and bowel  1970's    "tacked; damaged my intestines & had to remove some; done @ [facility]  . Cataract extraction w/ intraocular lens  implant, bilateral Bilateral   . Bascilic vein transposition Right 06/21/2013    Procedure: [facility];  Surgeon: Serafina Mitchell, MD;  Location: [facility] OR;  Service: Vascular;  Laterality: Right;  . Esophagogastroduodenoscopy N/A 07/19/2013    Procedure: ESOPHAGOGASTRODUODENOSCOPY (EGD);  Surgeon: Lafayette Dragon, MD;  Location: [facility] ENDOSCOPY;  Service: Endoscopy;  Laterality: N/A;  . Balloon dilation N/A 07/19/2013    Procedure: BALLOON DILATION;  Surgeon: Lafayette Dragon, MD;  Location: [facility] ENDOSCOPY;  Service: Endoscopy;   Laterality: N/A;  . Esophagogastroduodenoscopy N/A 07/20/2013    Procedure: ESOPHAGOGASTRODUODENOSCOPY (EGD);  Surgeon: Lafayette Dragon, MD;  Location: [facility] ENDOSCOPY;  Service: Endoscopy;  Laterality: N/A;  with removal of polyp in pylorus  . Bascilic vein transposition Right 09/20/2013    Procedure: 2ND STAGE RIGHT BASCILIC VEIN TRANSPOSITION; ULTRSOUND GUIDED;  Surgeon: Serafina Mitchell, MD;  Location: [facility] OR;  Service: Vascular;  Laterality: Right;  . Cardiac catheterization  2006; 2008; 2009; 01/13/2011  . Coronary angioplasty with stent placement  ~ 2007; ?    "had a total of 2 stents put in" 92/07/2014)  . Back surgery      History   Social History  . Marital Status: Widowed    Spouse Name: N/A    Number of Children: N/A  . Years of Education: N/A   Occupational History  . Not on file.   Social History Main Topics  . Smoking status: Former Smoker -- 1.00 packs/day for 50 years    Types: Cigarettes    Quit date: 07/13/1995  . Smokeless tobacco: Never Used     Comment: quit smoking around 1996  . Alcohol Use: No  . Drug Use: No  . Sexual Activity: No   Other Topics Concern  . Not on file   Social History Narrative   Lives in [place].    Family History  Problem Relation Age of Onset  . Colon cancer Neg Hx   . Thyroid disease Neg Hx   . Stroke Brother   . Hypertension Brother   . Kidney disease Brother   . Kidney disease Brother   . Other Mother     Sepsis and perforated colon  . Kidney disease Mother   . Other Father     Motor vehicle accident  . Learning disabilities Son   . Kidney disease Maternal Aunt   . Kidney disease Maternal Grandmother     Allergies as of 01/20/2014 - Review Complete 01/20/2014  Allergen Reaction Noted  . Amiodarone Hives   . Propylthiouracil Rash 11/23/2011    Current Outpatient Prescriptions on File Prior to Visit  Medication Sig Dispense Refill  . ALPRAZolam (XANAX) 0.5 MG tablet Take 0.5 mg by mouth at bedtime.       Marland Kitchen aspirin  81 MG tablet Take 81 mg by mouth daily.      . diphenoxylate-atropine (LOMOTIL) 2.5-0.025 MG per tablet Take 1-2 tablets by mouth every 8 (eight) hours as needed for diarrhea or loose stools.      Marland Kitchen  furosemide (LASIX) 40 MG tablet 40 mg twice daily  60 tablet  6  . hydrALAZINE (APRESOLINE) 25 MG tablet Take 1 tablet (25 mg total) by mouth 3 (three) times daily.  84 tablet  6  . isosorbide mononitrate (IMDUR) 60 MG 24 hr tablet 2 tabs by mouth every morning      . lovastatin (MEVACOR) 10 MG tablet Take 10 mg by mouth at bedtime.       . magnesium oxide (MAG-OX) 400 MG tablet Take 400 mg by mouth at bedtime.       . metolazone (ZAROXOLYN) 2.5 MG tablet Take 2.5 mg by mouth daily as needed.      . metoprolol tartrate (LOPRESSOR) 25 MG tablet Take 1 tablet (25 mg total) by mouth 2 (two) times daily.  60 tablet  0  . mometasone (NASONEX) 50 MCG/ACT nasal spray Place 2 sprays into the nose daily as needed.      . nitroGLYCERIN (NITROSTAT) 0.4 MG SL tablet Place 0.4 mg under the tongue every 5 (five) minutes as needed for chest pain.      Marland Kitchen ondansetron (ZOFRAN) 4 MG tablet Take 1 tablet (4 mg total) by mouth every 8 (eight) hours as needed for nausea or vomiting.  40 tablet  2  . oxyCODONE-acetaminophen (PERCOCET/ROXICET) 5-325 MG per tablet Take 1 tablet by mouth every 6 (six) hours as needed for severe pain. For pain      . pantoprazole (PROTONIX) 40 MG tablet Take 40 mg by mouth 2 (two) times daily.       . potassium chloride SA (K-DUR,KLOR-CON) 20 MEQ tablet 2 tablets (40 mEq) twice daily      . warfarin (COUMADIN) 2 MG tablet Take 2-3 mg by mouth daily at 6 PM. Takes 1 tablets (3mg ) on Mondays and Fridays.  Takes 1 tablet (2mg ) on all remaining days.      . [DISCONTINUED] FLUoxetine (PROZAC) 20 MG capsule Take 20 mg by mouth daily.         No current facility-administered medications on file prior to visit.     REVIEW OF SYSTEMS: No interval changes  PHYSICAL EXAMINATION:   Vital signs are  BP 142/56  Pulse 64  Temp(Src) 97.8 F (36.6 C) (Oral)  Resp 18  Wt 199 lb (90.266 kg)  SpO2 98% General: The patient appears their stated age. HEENT:  No gross abnormalities Pulmonary:  Non labored breathing Musculoskeletal: There are no major deformities. Neurologic: No focal weakness or paresthesias are detected, Skin: There are no ulcer or rashes noted. Psychiatric: The patient has normal affect. Cardiovascular: Excellent thrill within the right upper arm basilic vein transposition no open wounds   Diagnostic Studies None  Assessment: End-stage renal disease Plan: She has an excellent thrill within her right basilic vein transposition.  There are no wounds present.  From my perspective her fistula is ready for utilization, should she require dialysis.  She will follow up with me on an as-needed basis  V. Leia Alf, M.D. [facility] Office: [phone] Pager:  [phone]

## 2014-01-22 ENCOUNTER — Encounter: Payer: Self-pay | Admitting: Internal Medicine

## 2014-01-27 ENCOUNTER — Encounter: Payer: Self-pay | Admitting: Internal Medicine

## 2014-01-27 ENCOUNTER — Ambulatory Visit (INDEPENDENT_AMBULATORY_CARE_PROVIDER_SITE_OTHER): Payer: Medicare Other | Admitting: Internal Medicine

## 2014-01-27 VITALS — BP 130/52 | HR 61 | Ht 67.0 in | Wt 204.0 lb

## 2014-01-27 DIAGNOSIS — R0602 Shortness of breath: Secondary | ICD-10-CM

## 2014-01-27 MED ORDER — FLUTICASONE-SALMETEROL 100-50 MCG/DOSE IN AEPB: 1.0000 | INHALATION_SPRAY | Freq: Two times a day (BID) | RESPIRATORY_TRACT | Status: DC

## 2014-01-27 NOTE — Patient Instructions (Addendum)
Shortness of breath is due to weight , physical deconditioning and heart issues I am not convinced that you have a pulmonary problem We will set up home physical therapy We will test her oxygen at night when he is sleeping through an overnight oxygen study Try Advair 100/50,  1 puff twice daily  Followup  6-8 weeks to report progress

## 2014-01-27 NOTE — Progress Notes (Signed)
Subjective:    Patient ID: [name], female    DOB: 1935/07/20, 79 y.o.   MRN: [id number]  HPI  PCP Tamsen Roers, MD Referred bu  DR Johnsie Cancel   HPI  [name] is a 79 y.o. female with a hx of CAD,  reports that she quit smoking about 18 years ago.  She has a 50 pack-year smoking history.s/p DES to the OM in 6/06, rotational atherectomy + DES to pLAD in 7/09, symptomatic bradycardia s/p pacemaker with upgrade to CRT-P 11/08, AFib s/p AVN ablation (prior amiodarone thyroiditis), chronic diastolic CHF, mitral regurgitation, CKD stage IV, HTN, HL, GERD, prior UGI bleed in 2008 related to NSAID use, PVD. She has had a difficult time with access for hemodialysis. She had a brachial vein transposition in 09/2013.   She was seen in 06/2013 with worsening chest pain consistent with CCS class III angina. She was ultimately set up for cardiac catheterization that demonstrated patent stent in the LAD and obtuse marginal 1 with nonobstructive disease elsewhere. Medical therapy was continued.  She was admitted in 07/2013 with progressive dysphagia. EGD demonstrated large prepyloric ulcer and polyp which was removed. She had esophageal dilatation.   Admitted in 09/2013 with acute on chronic renal failure in the setting of nausea, vomiting and anorexia with poor by mouth intake. Last seen by Dr. Johnsie Cancel 10/2013. She was recently seen by gastroenterology with persistent nausea as well as abdominal bloating, progressive dyspnea and orthopnea. Abdominal CT demonstrated groundglass opacity in the right middle lobe suspicious for pneumonia with trace effusion as well as nodular opacity in the left lung base suspicious for an infectious/inflammatory process, scattered hepatic cysts measuring up to 17 mm, findings consistent with polycystic kidney disease. No ascites was noted. Liver size was normal. BNP remained elevated at 1368.    She presented to cardiology in 11/01/2013 and she reported  She has had  significant cough over the last couple of weeks. She described yellow sputum. She notes poor appetite. She sometimes coughs hard enough to vomit. She also has vomiting with certain meals. She denies hemoptysis. She denies significant fever. She feels weak. She notes increased shortness of breath. She probably described NYHA class III symptoms. She denies orthopnea. She has noted PND as well as edema.   At this time CT scan of the abdomen lung cut showed findings of possible pneumonia [see below]. She was then discharged with 7 days of Avelox. And she was referred to pulmonary. Today 11/08/2013 she presents for pulmonary followup in consultation. She reports that her cough is significantly improved at least over 70% since starting antibiotics. There is some residual cough with mild associated postnasal drainage. However, her main concern is dyspnea since December 2014 as of insidious onset and slowly progressive. Probably New York Heart Association class III. Relieved by rest. The last several weeks his increasing edema.  REC #Pneumonia and lung nodule on CT  - repeat CT chest wo contrast in 2 months  #Shortness of breath  - do ONO Test on Room Air ASAP n-  ONO 11/25/13 shows pulse ox < 88% at 14min and 20 sec. - > O2 not started at followup  - do PFT breathing test next several days/few weeks - do duplex Lower Extremity rule out DVT -> NEGATIVE 11/22/13  #Followup  - next few weeks after you complete tests in the dyspnea section  - bring all all your medicines for med calendar with NP Tammy  - Tammy will also review your  test results  12/26/13 Follow up  Returns for follow up  Returns for follow up and med review .  We reviewed all meds and organized them into a med calendar with pt education.  Had PFT today >PFTs showed FEV1 of 1.48 L, 66% of predicted, ratio of 80, no significant bronchodilator response,  diffusing capacity 71%, FVC 62% Has upcoming CT next weeek.  Does complains of bloating  in stomach and no appetite .  No chest pain, orthopnea or edema, fever, or vomiting.  Says her breathing is not to bad, does get winded at times but main issue she feels is her stomach.    Studies:  - Myoview (01/2009): EF 79%, no scar or ischemia.  - Echo (03/2013): Mild focal basal septal hypertrophy, EF 55-60%, mild MR, moderate to severe LAE, mild to moderate RVE, reduced RVSF, moderate RAE.  - LHC (07/14/13): pLAD stent ok, LAD 30-40, ostial first septal perf 80, OM1 stent ok with 20 ISR, AVCFX 20-30, prox to mid RCA 30-40. Med Rx.  Recent Labs:  10/27/2013: ALT 11; Creatinine 2.68*; Hemoglobin 12.7; Potassium 4.6  10/28/2013: Pro B Natriuretic peptide (BNP) 1368.0*  10/27/2013: POC INR 2.16*; WBC 7.1  Abdominal CT (10/2013):  IMPRESSION: Scattered hepatic cysts measuring up to 17 mm. Liver is otherwise unremarkable on unenhanced CT. 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 Mild patchy/ground-glass opacity in the right middle lobe, suspicious for pneumonia. Trace right pleural effusion. 11 x 7 mm patchy/nodular opacity at the left lung base, possibly infectious/inflammatory, although indeterminate. Consider follow-up CT chest in 3 months to assess for persistence.   OV 01/27/2014  Chief Complaint  Patient presents with  . Follow-up    Pt states breathing is unchanged since last OV. C/o dyspnea with activity and mild dry cough. Pt denied CP/tightness. Pt states she has intermittent episodes of emesis lasting 2-3 days at a time. Pt denies diarrhea.    Followup dyspnea. She presents with her son.  - A pulmonary function test shows restricted spirometry but normal lung volumes and normal diffusion capacity. Overnight desaturation test was positive for desaturation but it is over 30 days and she is not on oxygen. Duplex lower extremity was negative for DVT. She continues to be dyspneic.  She is mostly sedentary and very  deconditioned. She uses a walker to get around and gets dyspneic class III. She is aware of a murmur in her heart but she says that there is no intervention planned. She also never had physical therapy after her discharge from the hospital in early 2015.   CT chest 01/16/14 CLINICAL DATA: Followup pulmonary nodule  EXAM:  CT CHEST WITHOUT CONTRAST  TECHNIQUE:  Multidetector CT imaging of the chest was performed following the  standard protocol without IV contrast.  COMPARISON: CT abdomen/ pelvis 10/30/2013  FINDINGS:  Left-sided pacer in place. Thyroid is normal in appearance. Moderate  cardiomegaly noted. No pericardial effusion. No pleural effusion.  Pretracheal node measures 1 cm image 17. No hilar, axillary, or AP  window lymphadenopathy. Mild atheromatous aortic calcification  without aneurysm and coronary arterial calcification.  Incomplete imaging of the upper abdomen re- demonstrates partly  imaged innumerable low-density renal cortical lesions compatible  with previously seen cysts. Hepatic hypodense lesions are also  [place] compatible suspect hepatic hypodense lesions are also  compatible with cysts. Extensive splenic arterial calcification  noted. Cholecystectomy clips are noted.  Previously seen curvilinear and nodular bibasilar subpleural  opacities have nearly resolved,  with a few residual curvilinear  areas of probable scarring, for example in the right lower lobe  image 32 and lingula image 32. Central airways are patent.  No acute osseous abnormality. An area of focal trabecular  rarefaction in the T7 vertebral body is reidentified, most likely a  hemangioma.  IMPRESSION:  Minimal residual curvilinear bibasilar scarring or less likely  atelectasis. Previously seen areas of more nodular consolidation  have resolved. No new acute cardiopulmonary process.  Cardiomegaly.  Electronically Signed  By: Conchita Paris M.D.  On: 01/16/2014 13:56   Review of Systems    Constitutional: Negative for fever and unexpected weight change.  HENT: Positive for rhinorrhea, sinus pressure and trouble swallowing. Negative for congestion, dental problem, ear pain, nosebleeds, postnasal drip, sneezing and sore throat.   Eyes: Negative for redness and itching.  Respiratory: Positive for cough and shortness of breath. Negative for chest tightness and wheezing.   Cardiovascular: Positive for leg swelling. Negative for palpitations.  Gastrointestinal: Positive for nausea and vomiting.  Genitourinary: Negative for dysuria.  Musculoskeletal: Negative for joint swelling.  Skin: Negative for rash.  Neurological: Negative for headaches.  Hematological: Does not bruise/bleed easily.  Psychiatric/Behavioral: Negative for dysphoric mood. The patient is not nervous/anxious.        Objective:   Physical Exam  Vitals reviewed. Constitutional: She is oriented to person, place, and time. She appears well-developed and well-nourished. No distress.  Obese Sitting in wheel chair Looks deconditoned  HENT:  Head: Normocephalic and atraumatic.  Right Ear: External ear normal.  Left Ear: External ear normal.  Mouth/Throat: Oropharynx is clear and moist. No oropharyngeal exudate.  Eyes: Conjunctivae and EOM are normal. Pupils are equal, round, and reactive to light. Right eye exhibits no discharge. Left eye exhibits no discharge. No scleral icterus.  Neck: Normal range of motion. Neck supple. No JVD present. No tracheal deviation present. No thyromegaly present.  Cardiovascular: Normal rate, regular rhythm, normal heart sounds and intact distal pulses.  Exam reveals no gallop and no friction rub.   No murmur heard. Pulmonary/Chest: Effort normal and breath sounds normal. No respiratory distress. She has no wheezes. She has no rales. She exhibits no tenderness.  Some scattered crackles  Abdominal: Soft. Bowel sounds are normal. She exhibits no distension and no mass. There is no  tenderness. There is no rebound and no guarding.  Musculoskeletal: Normal range of motion. She exhibits no edema and no tenderness.  Sitting in wheel chair  Lymphadenopathy:    She has no cervical adenopathy.  Neurological: She is alert and oriented to person, place, and time. She has normal reflexes. No cranial nerve deficit. She exhibits normal muscle tone. Coordination normal.  Skin: Skin is warm and dry. No rash noted. She is not diaphoretic. No erythema. No pallor.  Psychiatric: Judgment and thought content normal.     Filed Vitals:   01/27/14 1424  BP: 130/52  Pulse: 61  Height: 5\' 7"  (1.702 m)  Weight: 204 lb (92.534 kg)  SpO2: 97%         Assessment & Plan:  Shortness of breath is due to weight , physical deconditioning and heart issues I am not convinced that you have a pulmonary problem We will set up home physical therapy We will test her oxygen at night when he is sleeping through an overnight oxygen study Try Advair 100/50,  1 puff twice daily  Followup  6-8 weeks to report progress

## 2014-01-28 ENCOUNTER — Ambulatory Visit: Payer: Medicare Other | Admitting: Cardiovascular Disease

## 2014-01-29 ENCOUNTER — Telehealth: Payer: Self-pay | Admitting: Internal Medicine

## 2014-01-30 NOTE — Telephone Encounter (Signed)
Will forward message to [facility] box to see if there are any other DME's that we could send request to. Thanks.

## 2014-01-30 NOTE — Telephone Encounter (Signed)
Order faxed to [facility] for Home PT. Called and spoke with patient and advised her that order has been sent to [facility] instead of [facility] (because [facility] could not service patient. I don't know if she understood because she hung up the phone. Rhonda J Cobb

## 2014-02-02 NOTE — Assessment & Plan Note (Signed)
Shortness of breath is due to weight , physical deconditioning and heart issues I am not convinced that you have a pulmonary problem We will set up home physical therapy We will test her oxygen at night when he is sleeping through an overnight oxygen study Try Advair 100/50,  1 puff twice daily  Followup  6-8 weeks to report progress

## 2014-02-22 ENCOUNTER — Other Ambulatory Visit: Payer: Self-pay | Admitting: Physician Assistant

## 2014-02-22 ENCOUNTER — Telehealth: Payer: Self-pay | Admitting: Internal Medicine

## 2014-02-22 NOTE — Telephone Encounter (Signed)
[name]  Let her know that her ONO came out ok on 01/30/14 . She does not need o2  Thanks  Dr. Brand Males, M.D., [facility].C.P Pulmonary and Critical Care Medicine Staff Physician [facility] Pager: [phone], If no answer or between  15:00h - 7:00h: call [phone]  02/22/2014 6:14 PM    For my use   RA < /= 88% is only 1 min 32 sec. Avg pulse ox at night is 94%

## 2014-02-24 NOTE — Telephone Encounter (Signed)
[name] (son), [phone] returned call

## 2014-02-24 NOTE — Telephone Encounter (Signed)
lmtcb

## 2014-02-24 NOTE — Telephone Encounter (Signed)
I called made pt aware of results below. She voiced understanding. She wants to know what MR thinks is going on then. Please advise thanks

## 2014-02-24 NOTE — Telephone Encounter (Signed)
This is what I told her at last OV and this is what I think is gong on. Normal ONO is further suggestion that these are causes of dyspnea    Shortness of breath is due to weight , physical deconditioning and heart issues  I am not convinced that you have a pulmonary problem  We will set up home physical therapy  Try Advair 100/50, 1 puff twice daily  Followup  6-8 weeks to report progress

## 2014-02-25 ENCOUNTER — Telehealth: Payer: Self-pay | Admitting: Internal Medicine

## 2014-02-25 ENCOUNTER — Emergency Department (INDEPENDENT_AMBULATORY_CARE_PROVIDER_SITE_OTHER)
Admission: EM | Admit: 2014-02-25 | Discharge: 2014-02-25 | Disposition: A | Discharge status: Home / Self Care | Payer: Medicare Other | Source: Home / Self Care | Attending: Family Medicine | Admitting: Family Medicine

## 2014-02-25 ENCOUNTER — Encounter (HOSPITAL_COMMUNITY): Payer: Self-pay | Admitting: Emergency Medicine

## 2014-02-25 ENCOUNTER — Emergency Department (INDEPENDENT_AMBULATORY_CARE_PROVIDER_SITE_OTHER): Payer: Medicare Other

## 2014-02-25 DIAGNOSIS — S9031XA Contusion of right foot, initial encounter: Secondary | ICD-10-CM

## 2014-02-25 DIAGNOSIS — IMO0002 Reserved for concepts with insufficient information to code with codable children: Secondary | ICD-10-CM

## 2014-02-25 DIAGNOSIS — S9030XA Contusion of unspecified foot, initial encounter: Secondary | ICD-10-CM

## 2014-02-25 NOTE — Telephone Encounter (Signed)
Spoke with [place] regarding Mrs. Nadeau's face to face and we were unable to locate the form. Called and spoke to [place]. Informed Sherie to re-fax form to the front fax. Nothing further needed.

## 2014-02-25 NOTE — ED Notes (Signed)
Right foot pain, dropped tea jug on right foot on 7/13/  Foot swollen and painful

## 2014-02-25 NOTE — Telephone Encounter (Signed)
Received forms and given to Alida to complete and fax back. Nothing further needed.

## 2014-02-25 NOTE — Discharge Instructions (Signed)
Your xrays were without evidence of injury to your bones. You will need to keep your right foot elevated as much as possible to try to reduce swelling. Extra strength tylenol as directed on packaging for pain. Post op shoe as needed for comfort. Will likely take 7-10 days for swelling to improve.  Contusion A contusion is a deep bruise. Contusions are the result of an injury that caused bleeding under the skin. The contusion may turn blue, purple, or yellow. Minor injuries will give you a painless contusion, but more severe contusions may stay painful and swollen for a few weeks.  CAUSES  A contusion is usually caused by a blow, trauma, or direct force to an area of the body. SYMPTOMS   Swelling and redness of the injured area.  Bruising of the injured area.  Tenderness and soreness of the injured area.  Pain. DIAGNOSIS  The diagnosis can be made by taking a history and physical exam. An X-ray, CT scan, or MRI may be needed to determine if there were any associated injuries, such as fractures. TREATMENT  Specific treatment will depend on what area of the body was injured. In general, the best treatment for a contusion is resting, icing, elevating, and applying cold compresses to the injured area. Over-the-counter medicines may also be recommended for pain control. Ask your caregiver what the best treatment is for your contusion. HOME CARE INSTRUCTIONS   Put ice on the injured area.  Put ice in a plastic bag.  Place a towel between your skin and the bag.  Leave the ice on for 15-20 minutes, 3-4 times a day, or as directed by your health care provider.  Only take over-the-counter or prescription medicines for pain, discomfort, or fever as directed by your caregiver. Your caregiver may recommend avoiding anti-inflammatory medicines (aspirin, ibuprofen, and naproxen) for 48 hours because these medicines may increase bruising.  Rest the injured area.  If possible, elevate the injured area  to reduce swelling. SEEK IMMEDIATE MEDICAL CARE IF:   You have increased bruising or swelling.  You have pain that is getting worse.  Your swelling or pain is not relieved with medicines. MAKE SURE YOU:   Understand these instructions.  Will watch your condition.  Will get help right away if you are not doing well or get worse. Document Released: 05/11/2005 Document Revised: 08/06/2013 Document Reviewed: 06/06/2011 [facility] Patient Information 2015 [place], [place]. This information is not intended to replace advice given to you by your health care provider. Make sure you discuss any questions you have with your health care provider.  Foot Contusion A foot contusion is a deep bruise to the foot. Contusions are the result of an injury that caused bleeding under the skin. The contusion may turn blue, purple, or yellow. Minor injuries will give you a painless contusion, but more severe contusions may stay painful and swollen for a few weeks. CAUSES  A foot contusion comes from a direct blow to that area, such as a heavy object falling on the foot. SYMPTOMS   Swelling of the foot.  Discoloration of the foot.  Tenderness or soreness of the foot. DIAGNOSIS  You will have a physical exam and will be asked about your history. You may need an X-ray of your foot to look for a broken bone (fracture).  TREATMENT  An elastic wrap may be recommended to support your foot. Resting, elevating, and applying cold compresses to your foot are often the best treatments for a foot contusion. Over-the-counter  medicines may also be recommended for pain control. HOME CARE INSTRUCTIONS   Put ice on the injured area.  Put ice in a plastic bag.  Place a towel between your skin and the bag.  Leave the ice on for 15-20 minutes, 03-04 times a day.  Only take over-the-counter or prescription medicines for pain, discomfort, or fever as directed by your caregiver.  If told, use an elastic wrap as directed.  This can help reduce swelling. You may remove the wrap for sleeping, showering, and bathing. If your toes become numb, cold, or blue, take the wrap off and reapply it more loosely.  Elevate your foot with pillows to reduce swelling.  Try to avoid standing or walking while the foot is painful. Do not resume use until instructed by your caregiver. Then, begin use gradually. If pain develops, decrease use. Gradually increase activities that do not cause discomfort until you have normal use of your foot.  See your caregiver as directed. It is very important to keep all follow-up appointments in order to avoid any lasting problems with your foot, including long-term (chronic) pain. SEEK IMMEDIATE MEDICAL CARE IF:   You have increased redness, swelling, or pain in your foot.  Your swelling or pain is not relieved with medicines.  You have loss of feeling in your foot or are unable to move your toes.  Your foot turns cold or blue.  You have pain when you move your toes.  Your foot becomes warm to the touch.  Your contusion does not improve in 2 days. MAKE SURE YOU:   Understand these instructions.  Will watch your condition.  Will get help right away if you are not doing well or get worse. Document Released: 05/23/2006 Document Revised: 01/31/2012 Document Reviewed: 07/05/2011 [facility] Patient Information 2015 [place], [place]. This information is not intended to replace advice given to you by your health care provider. Make sure you discuss any questions you have with your health care provider.  Hematoma A hematoma is a collection of blood under the skin, in an organ, in a body space, in a joint space, or in other tissue. The blood can clot to form a lump that you can see and feel. The lump is often firm and may sometimes become sore and tender. Most hematomas get better in a few days to weeks. However, some hematomas may be serious and require medical care. Hematomas can range in size from  very small to very large. CAUSES  A hematoma can be caused by a blunt or penetrating injury. It can also be caused by spontaneous leakage from a blood vessel under the skin. Spontaneous leakage from a blood vessel is more likely to occur in older people, especially those taking blood thinners. Sometimes, a hematoma can develop after certain medical procedures. SIGNS AND SYMPTOMS   A firm lump on the body.  Possible pain and tenderness in the area.  Bruising.Blue, dark blue, purple-red, or yellowish skin may appear at the site of the hematoma if the hematoma is close to the surface of the skin. For hematomas in deeper tissues or body spaces, the signs and symptoms may be subtle. For example, an intra-abdominal hematoma may cause abdominal pain, weakness, fainting, and shortness of breath. An intracranial hematoma may cause a headache or symptoms such as weakness, trouble speaking, or a change in consciousness. DIAGNOSIS  A hematoma can usually be diagnosed based on your medical history and a physical exam. Imaging tests may be needed if your health  care provider suspects a hematoma in deeper tissues or body spaces, such as the abdomen, head, or chest. These tests may include ultrasonography or a CT scan.  TREATMENT  Hematomas usually go away on their own over time. Rarely does the blood need to be drained out of the body. Large hematomas or those that may affect vital organs will sometimes need surgical drainage or monitoring. HOME CARE INSTRUCTIONS   Apply ice to the injured area:   Put ice in a plastic bag.   Place a towel between your skin and the bag.   Leave the ice on for 20 minutes, 2-3 times a day for the first 1 to 2 days.   After the first 2 days, switch to using warm compresses on the hematoma.   Elevate the injured area to help decrease pain and swelling. Wrapping the area with an elastic bandage may also be helpful. Compression helps to reduce swelling and promotes  shrinking of the hematoma. Make sure the bandage is not wrapped too tight.   If your hematoma is on a lower extremity and is painful, crutches may be helpful for a couple days.   Only take over-the-counter or prescription medicines as directed by your health care provider. SEEK IMMEDIATE MEDICAL CARE IF:   You have increasing pain, or your pain is not controlled with medicine.   You have a fever.   You have worsening swelling or discoloration.   Your skin over the hematoma breaks or starts bleeding.   Your hematoma is in your chest or abdomen and you have weakness, shortness of breath, or a change in consciousness.  Your hematoma is on your scalp (caused by a fall or injury) and you have a worsening headache or a change in alertness or consciousness. MAKE SURE YOU:   Understand these instructions.  Will watch your condition.  Will get help right away if you are not doing well or get worse. Document Released: 03/15/2004 Document Revised: 04/03/2013 Document Reviewed: 01/09/2013 [facility] Patient Information 2015 [place], [place]. This information is not intended to replace advice given to you by your health care provider. Make sure you discuss any questions you have with your health care provider.

## 2014-02-25 NOTE — ED Provider Notes (Signed)
CSN: [id number]     Arrival date & time 02/25/14  1445 History   First MD Initiated Contact with Patient 02/25/14 1538     Chief Complaint  Patient presents with  . Foot Pain   (Consider location/radiation/quality/duration/timing/severity/associated sxs/prior Treatment) HPI Comments: Right foot pain, swelling and ecchymosis after dropping 1/2 gallon of iced tea on her foot on 02-24-2014.  Patient is a 79 y.o. female presenting with lower extremity pain. The history is provided by the patient and a relative.  Foot Pain    Past Medical History  Diagnosis Date  . Cellulitis and abscess of unspecified site     a. h/o MSRA cellulitis of abdominal wall.  Marland Kitchen CAD (coronary artery disease)     a. DES to OM 01/2005. b. Rotational atherotomy/DES to prox LAD 02/2008. c. Cath 07/2013: stable, for med rx.  . Edema   . Warfarin anticoagulation     a. Followed by PCP.  Marland Kitchen GI bleed     a. EGD 01/2007: antral ulcers related to NSAID/aspirin use.  . Peripheral vascular disease   . Hyperthyroidism     a. Hx of intolerance to PTU.  Marland Kitchen Blood transfusion years ago    "heart problems" (09/26/2013)  . H/O hiatal hernia   . Umbilical hernia     unrepaired (11/24/11)  . Valvular heart disease     Echo 11/2011: mild-mod MR.  Marland Kitchen Hypertension     takes Amlodipine,Hydralazine,and Imdur daily  . Hyperlipidemia     takes Lovastatin daily  . SSS (sick sinus syndrome)     a. H/o AV node ablation 2008 secondary to difficult to control afib/flutter, with subsequent pacer.  . Atrial fibrillation with rapid ventricular response     a. H/o amiodarone thyroiditis. b. s/p AV node ablation 2008 with implantation of PPM 01/2007, upgraded to CRT-P 06/2007.;takes Coumadin daily  . GERD (gastroesophageal reflux disease)     takes Protonix daily  . Chronic diastolic CHF (congestive heart failure)     takes Lasix daily and Spironolactone   . Pacemaker     [employer]  . History of gout   . Bruises easily   . Hemorrhoids   .  Depression     takes Xanax daily  . Insomnia     takes Xanax if needed  . Heart murmur   . Chronic renal insufficiency     Cr ~2.3.  . Polycystic kidney disease     a. Per remote E-Chart records.  . Kidney stones   . Myocardial infarction     "I've had 2-3; last one was in /1996" (09/26/2013)  . Exertional shortness of breath   . Migraines     "last one was a pretty good while ago" (09/26/2013)  . Arthritis     "all over" (09/26/2013)  . Chronic lower back pain     deteriorating disc  . Anxiety   . Iron deficiency anemia     takes an Iron Pill daily  . Pneumonia    Past Surgical History  Procedure Laterality Date  . Esophagogastroduodenoscopy    . Tonsillectomy  ~ 1960  . Laminectomy and microdiscectomy lumbar spine  08/2003  . Cardioversion  10/2004; 02/2007    /E-chart  . Cholecystectomy    . Appendectomy  1954  . Vaginal hysterectomy  ?1981  . Av node ablation  02/2007    /E-chart  . Insert / replace / remove pacemaker  01/2007    initial placement  . Insert / replace /  remove pacemaker  06/2007    upgraded/E-chart  . Av fistula placement, brachiocephalic Right 08/6107    "didn't work; my veins were too small"  . Joint replacement    . Total knee arthroplasty Left 01/2009  . Bladder and bowel  1970's    "tacked; damaged my intestines & had to remove some; done @ [facility]  . Cataract extraction w/ intraocular lens  implant, bilateral Bilateral   . Bascilic vein transposition Right 06/21/2013    Procedure: Raoul;  Surgeon: Serafina Mitchell, MD;  Location: [facility] OR;  Service: Vascular;  Laterality: Right;  . Esophagogastroduodenoscopy N/A 07/19/2013    Procedure: ESOPHAGOGASTRODUODENOSCOPY (EGD);  Surgeon: Lafayette Dragon, MD;  Location: [facility] ENDOSCOPY;  Service: Endoscopy;  Laterality: N/A;  . Balloon dilation N/A 07/19/2013    Procedure: BALLOON DILATION;  Surgeon: Lafayette Dragon, MD;  Location: [facility] ENDOSCOPY;  Service: Endoscopy;  Laterality: N/A;    . Esophagogastroduodenoscopy N/A 07/20/2013    Procedure: ESOPHAGOGASTRODUODENOSCOPY (EGD);  Surgeon: Lafayette Dragon, MD;  Location: [facility] ENDOSCOPY;  Service: Endoscopy;  Laterality: N/A;  with removal of polyp in pylorus  . Bascilic vein transposition Right 09/20/2013    Procedure: 2ND STAGE RIGHT BASCILIC VEIN TRANSPOSITION; ULTRSOUND GUIDED;  Surgeon: Serafina Mitchell, MD;  Location: [facility] OR;  Service: Vascular;  Laterality: Right;  . Cardiac catheterization  2006; 2008; 2009; 01/13/2011  . Coronary angioplasty with stent placement  ~ 2007; ?    "had a total of 2 stents put in" 92/07/2014)  . Back surgery     Family History  Problem Relation Age of Onset  . Colon cancer Neg Hx   . Thyroid disease Neg Hx   . Stroke Brother   . Hypertension Brother   . Kidney disease Brother   . Kidney disease Brother   . Other Mother     Sepsis and perforated colon  . Kidney disease Mother   . Other Father     Motor vehicle accident  . Learning disabilities Son   . Kidney disease Maternal Aunt   . Kidney disease Maternal Grandmother    History  Substance Use Topics  . Smoking status: Former Smoker -- 1.00 packs/day for 50 years    Types: Cigarettes    Quit date: 07/13/1995  . Smokeless tobacco: Never Used     Comment: quit smoking around 1996  . Alcohol Use: No   OB History   Grav Para Term Preterm Abortions TAB SAB Ect Mult Living                 Review of Systems  All other systems reviewed and are negative.   Allergies  Amiodarone and Propylthiouracil  Home Medications   Prior to Admission medications   Medication Sig Start Date End Date Taking? Authorizing Provider  ALPRAZolam Duanne Moron) 0.5 MG tablet Take 0.5 mg by mouth at bedtime.     Historical Provider, MD  aspirin 81 MG tablet Take 81 mg by mouth daily.    Historical Provider, MD  diphenoxylate-atropine (LOMOTIL) 2.5-0.025 MG per tablet Take 1-2 tablets by mouth every 8 (eight) hours as needed for diarrhea or loose stools.     Historical Provider, MD  Fluticasone-Salmeterol (ADVAIR DISKUS) 100-50 MCG/DOSE AEPB Inhale 1 puff into the lungs 2 (two) times daily. 01/27/14   Brand Males, MD  furosemide (LASIX) 40 MG tablet 40 mg twice daily 12/09/13   Josue Hector, MD  hydrALAZINE (APRESOLINE) 25 MG tablet Take 1  tablet (25 mg total) by mouth 3 (three) times daily. 12/09/13   Josue Hector, MD  isosorbide mononitrate (IMDUR) 60 MG 24 hr tablet TAKE 2 TABLETS BY MOUTH EVERY DAY    Josue Hector, MD  lovastatin (MEVACOR) 10 MG tablet Take 10 mg by mouth at bedtime.     Historical Provider, MD  magnesium oxide (MAG-OX) 400 MG tablet Take 400 mg by mouth at bedtime.     Historical Provider, MD  metolazone (ZAROXOLYN) 2.5 MG tablet Take 2.5 mg by mouth daily as needed.    Historical Provider, MD  metoprolol tartrate (LOPRESSOR) 25 MG tablet Take 1 tablet (25 mg total) by mouth 2 (two) times daily. 12/10/13   Josue Hector, MD  mometasone (NASONEX) 50 MCG/ACT nasal spray Place 2 sprays into the nose daily as needed.    Historical Provider, MD  nitroGLYCERIN (NITROSTAT) 0.4 MG SL tablet Place 0.4 mg under the tongue every 5 (five) minutes as needed for chest pain.    Historical Provider, MD  ondansetron (ZOFRAN) 4 MG tablet Take 1 tablet (4 mg total) by mouth every 8 (eight) hours as needed for nausea or vomiting. 10/28/13   Amy S Esterwood, PA-C  oxyCODONE-acetaminophen (PERCOCET/ROXICET) 5-325 MG per tablet Take 1 tablet by mouth every 6 (six) hours as needed for severe pain. For pain 09/20/13   Hulen Shouts Rhyne, PA-C  pantoprazole (PROTONIX) 40 MG tablet Take 40 mg by mouth 2 (two) times daily.     Historical Provider, MD  potassium chloride SA (K-DUR,KLOR-CON) 20 MEQ tablet 2 tablets (40 mEq) twice daily 11/22/13   Liliane Shi, PA-C  warfarin (COUMADIN) 2 MG tablet Take 2-3 mg by mouth daily at 6 PM. Takes 1 tablets (3mg ) on Mondays and Fridays.  Takes 1 tablet (2mg ) on all remaining days.    Historical Provider, MD   BP  124/81  Pulse 60  Temp(Src) 99.3 F (37.4 C) (Oral)  Resp 14  SpO2 100% Physical Exam  Nursing note and vitals reviewed. Constitutional: She is oriented to person, place, and time. She appears well-developed and well-nourished. No distress.  HENT:  Head: Normocephalic and atraumatic.  Eyes: Conjunctivae are normal.  Cardiovascular: Normal rate.   Pulmonary/Chest: Effort normal.  Musculoskeletal:       Right foot: She exhibits tenderness and swelling. She exhibits normal range of motion, no bony tenderness, normal capillary refill, no crepitus, no deformity and no laceration.       Feet:  Outlined area is with moderate STS, tenderness and hematoma. No deformity. Capillary refill at toes is normal.   Neurological: She is alert and oriented to person, place, and time.  Skin: Skin is warm and dry. No rash noted. No erythema.  +intact  Psychiatric: She has a normal mood and affect. Her behavior is normal.    ED Course  Procedures (including critical care time) Labs Review Labs Reviewed - No data to display  Imaging Review Dg Foot Complete Right  02/25/2014   CLINICAL DATA:  Foot pain, swelling and bruising. Recent trauma with object dropped on foot 1 day ago.  EXAM: RIGHT FOOT COMPLETE - 3+ VIEW  COMPARISON:  None  FINDINGS: No acute fracture or malalignment. Soft tissue swelling present over the dorsum of the forefoot overlying the metatarsal bones. Normal bony mineralization. Degenerative arthritis in the midfoot. Chronic (per technologist notes) medial deviation of the second digit at the MTP joint.  IMPRESSION: 1. Soft tissue swelling in the dorsal forefoot without evidence  of acute osseous abnormality. 2. Midfoot degenerative osteoarthritis in the intra tarsal and tarsometatarsal joints.   Electronically Signed   By: Jacqulynn Cadet M.D.   On: 02/25/2014 15:45     MDM   1. Contusion of right foot, initial encounter    Films without evidence of fracture or dislocation.  Patient instructed to wear post op shoe as needed for comfort and to keep right foot elevated as much as possible to reduce swelling. Tylenol as directed on packaging for pain. Advised that swelling would take at least 1-2 weeks to resolve.    [place], [place] 02/25/14 [phone]

## 2014-02-25 NOTE — Telephone Encounter (Signed)
Called # listed for [employer]. She reports they faxed over face to face form on 02/19/14 to [phone]. Please advise elise if you have this form? thanks

## 2014-02-26 NOTE — Telephone Encounter (Signed)
Called and spoke to pt. Informed pt of recs per MR. Pt verbalized understanding and denied any other questions or concerns at this time.

## 2014-02-26 NOTE — ED Provider Notes (Signed)
Medical screening examination/treatment/procedure(s) were performed by resident physician or non-physician practitioner and as supervising physician I was immediately available for consultation/collaboration.   Pauline Good MD.   Billy Fischer, MD 02/26/14 [phone]

## 2014-02-28 ENCOUNTER — Other Ambulatory Visit (HOSPITAL_COMMUNITY): Payer: Self-pay | Admitting: *Deleted

## 2014-03-03 ENCOUNTER — Ambulatory Visit (HOSPITAL_COMMUNITY)
Admit: 2014-03-03 | Discharge: 2014-03-03 | Disposition: A | Discharge status: Home / Self Care | Payer: Medicare Other | Source: Ambulatory Visit | Attending: Nephrology | Admitting: Nephrology

## 2014-03-03 DIAGNOSIS — D509 Iron deficiency anemia, unspecified: Secondary | ICD-10-CM | POA: Insufficient documentation

## 2014-03-03 MED ORDER — SODIUM CHLORIDE 0.9 % IV SOLN
1020.0000 mg | Freq: Once | INTRAVENOUS | Status: AC
  Administered 2014-03-03: 1020 mg via INTRAVENOUS
  Filled 2014-03-03: qty 34

## 2014-03-04 ENCOUNTER — Telehealth: Payer: Self-pay | Admitting: Internal Medicine

## 2014-03-04 NOTE — Telephone Encounter (Signed)
This has been received. Alida currently has document and is waiting on MR to return to office on 8/7 to be signed and faxed back.

## 2014-03-04 NOTE — Telephone Encounter (Signed)
Called spoke with Marg from [employer]. She refaxed pt face to face form that needs to be signed by MR. She isa ware he is out of the office until 03/21/14. Will forward to elise to make sure this has been received. Please advise thanks

## 2014-03-07 ENCOUNTER — Ambulatory Visit: Payer: Medicare Other | Admitting: Cardiovascular Disease

## 2014-03-17 ENCOUNTER — Encounter: Payer: Self-pay | Admitting: Gastroenterology

## 2014-03-19 NOTE — Telephone Encounter (Signed)
Form given to MR on 03/18/2014. Will await return to fax back.

## 2014-03-24 NOTE — Telephone Encounter (Signed)
Did MR sign this form yet? [place] Bing, CMA

## 2014-03-25 NOTE — Telephone Encounter (Signed)
This form has not yet been returned to me.   MR please advise if you have reviewed this yet.

## 2014-03-28 ENCOUNTER — Encounter: Payer: Self-pay | Admitting: Internal Medicine

## 2014-03-31 ENCOUNTER — Ambulatory Visit: Payer: Medicare Other | Admitting: Internal Medicine

## 2014-04-07 NOTE — Telephone Encounter (Signed)
Spoke with Marg @ [employer] this form was received signed on 03/19/14, nothing further is needed. Verdie Mosher

## 2014-04-07 NOTE — Telephone Encounter (Signed)
Elise/Alida, Do you know if this has been complete yet? Pls advise.

## 2014-04-08 ENCOUNTER — Other Ambulatory Visit: Payer: Self-pay | Admitting: *Deleted

## 2014-04-08 DIAGNOSIS — I5033 Acute on chronic diastolic (congestive) heart failure: Secondary | ICD-10-CM

## 2014-04-08 MED ORDER — FUROSEMIDE 40 MG PO TABS: ORAL_TABLET | ORAL | Status: DC

## 2014-04-08 MED ORDER — ISOSORBIDE MONONITRATE ER 60 MG PO TB24: ORAL_TABLET | ORAL | Status: DC

## 2014-04-08 MED ORDER — HYDRALAZINE HCL 25 MG PO TABS: 25.0000 mg | ORAL_TABLET | Freq: Three times a day (TID) | ORAL | Status: DC

## 2014-04-22 ENCOUNTER — Other Ambulatory Visit: Payer: Self-pay | Admitting: Physician Assistant

## 2014-04-24 ENCOUNTER — Ambulatory Visit: Payer: Medicare Other | Admitting: Internal Medicine

## 2014-04-25 ENCOUNTER — Ambulatory Visit (INDEPENDENT_AMBULATORY_CARE_PROVIDER_SITE_OTHER): Payer: Medicare Other | Admitting: Internal Medicine

## 2014-04-25 ENCOUNTER — Encounter: Payer: Self-pay | Admitting: Internal Medicine

## 2014-04-25 VITALS — BP 126/84 | HR 71 | Temp 98.4°F | Ht 67.0 in | Wt 207.6 lb

## 2014-04-25 DIAGNOSIS — R0602 Shortness of breath: Secondary | ICD-10-CM

## 2014-04-25 NOTE — Progress Notes (Signed)
Subjective:    Patient ID: [name], female    DOB: 08-Jan-1935, 79 y.o.   MRN: [id number]  HPI   PCP Tamsen Roers, MD Referred bu  DR Johnsie Cancel   HPI  [name] is a 79 y.o. female with a hx of CAD,  reports that she quit smoking about 18 years ago.  She has a 50 pack-year smoking history.s/p DES to the OM in 6/06, rotational atherectomy + DES to pLAD in 7/09, symptomatic bradycardia s/p pacemaker with upgrade to CRT-P 11/08, AFib s/p AVN ablation (prior amiodarone thyroiditis), chronic diastolic CHF, mitral regurgitation, CKD stage IV, HTN, HL, GERD, prior UGI bleed in 2008 related to NSAID use, PVD. She has had a difficult time with access for hemodialysis. She had a brachial vein transposition in 09/2013.   She was seen in 06/2013 with worsening chest pain consistent with CCS class III angina. She was ultimately set up for cardiac catheterization that demonstrated patent stent in the LAD and obtuse marginal 1 with nonobstructive disease elsewhere. Medical therapy was continued.  She was admitted in 07/2013 with progressive dysphagia. EGD demonstrated large prepyloric ulcer and polyp which was removed. She had esophageal dilatation.   Admitted in 09/2013 with acute on chronic renal failure in the setting of nausea, vomiting and anorexia with poor by mouth intake. Last seen by Dr. Johnsie Cancel 10/2013. She was recently seen by gastroenterology with persistent nausea as well as abdominal bloating, progressive dyspnea and orthopnea. Abdominal CT demonstrated groundglass opacity in the right middle lobe suspicious for pneumonia with trace effusion as well as nodular opacity in the left lung base suspicious for an infectious/inflammatory process, scattered hepatic cysts measuring up to 17 mm, findings consistent with polycystic kidney disease. No ascites was noted. Liver size was normal. BNP remained elevated at 1368.    She presented to cardiology in 11/01/2013 and she reported  She has had  significant cough over the last couple of weeks. She described yellow sputum. She notes poor appetite. She sometimes coughs hard enough to vomit. She also has vomiting with certain meals. She denies hemoptysis. She denies significant fever. She feels weak. She notes increased shortness of breath. She probably described NYHA class III symptoms. She denies orthopnea. She has noted PND as well as edema.   At this time CT scan of the abdomen lung cut showed findings of possible pneumonia [see below]. She was then discharged with 7 days of Avelox. And she was referred to pulmonary. Today 11/08/2013 she presents for pulmonary followup in consultation. She reports that her cough is significantly improved at least over 70% since starting antibiotics. There is some residual cough with mild associated postnasal drainage. However, her main concern is dyspnea since December 2014 as of insidious onset and slowly progressive. Probably New [place] Heart Association class III. Relieved by rest. The last several weeks his increasing edema.  REC #Pneumonia and lung nodule on CT  - repeat CT chest wo contrast in 2 months  #Shortness of breath  - do ONO Test on Room Air ASAP n-  ONO 11/25/13 shows pulse ox < 88% at 72min and 20 sec. - > O2 not started at followup  - do PFT breathing test next several days/few weeks - do duplex Lower Extremity rule out DVT -> NEGATIVE 11/22/13  #Followup  - next few weeks after you complete tests in the dyspnea section  - bring all all your medicines for med calendar with NP Tammy  - Tammy will also review  your test results  12/26/13 Follow up  Returns for follow up  Returns for follow up and med review .  We reviewed all meds and organized them into a med calendar with pt education.  Had PFT today >PFTs showed FEV1 of 1.48 L, 66% of predicted, ratio of 80, no significant bronchodilator response,  diffusing capacity 71%, FVC 62% Has upcoming CT next weeek.  Does complains of bloating  in stomach and no appetite .  No chest pain, orthopnea or edema, fever, or vomiting.  Says her breathing is not to bad, does get winded at times but main issue she feels is her stomach.    Studies:  - Myoview (01/2009): EF 79%, no scar or ischemia.  - Echo (03/2013): Mild focal basal septal hypertrophy, EF 55-60%, mild MR, moderate to severe LAE, mild to moderate RVE, reduced RVSF, moderate RAE.  - LHC (07/14/13): pLAD stent ok, LAD 30-40, ostial first septal perf 80, OM1 stent ok with 20 ISR, AVCFX 20-30, prox to mid RCA 30-40. Med Rx.  Recent Labs:  10/27/2013: ALT 11; Creatinine 2.68*; Hemoglobin 12.7; Potassium 4.6  10/28/2013: Pro B Natriuretic peptide (BNP) 1368.0*  10/27/2013: POC INR 2.16*; WBC 7.1  Abdominal CT (10/2013):  IMPRESSION: Scattered hepatic cysts measuring up to 17 mm. Liver is otherwise unremarkable on unenhanced CT. 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 Mild patchy/ground-glass opacity in the right middle lobe, suspicious for pneumonia. Trace right pleural effusion. 11 x 7 mm patchy/nodular opacity at the left lung base, possibly infectious/inflammatory, although indeterminate. Consider follow-up CT chest in 3 months to assess for persistence.   OV 01/27/2014  Chief Complaint  Patient presents with  . Follow-up    Pt states breathing is unchanged since last OV. C/o dyspnea with activity and mild dry cough. Pt denied CP/tightness. Pt states she has intermittent episodes of emesis lasting 2-3 days at a time. Pt denies diarrhea.    Followup dyspnea. She presents with her son.  - A pulmonary function test shows restricted spirometry but normal lung volumes and normal diffusion capacity. Overnight desaturation test was positive for desaturation but it is over 30 days and she is not on oxygen. Duplex lower extremity was negative for DVT. She continues to be dyspneic.  She is mostly sedentary and very  deconditioned. She uses a walker to get around and gets dyspneic class III. She is aware of a murmur in her heart but she says that there is no intervention planned. She also never had physical therapy after her discharge from the hospital in early 2015.  IMPRESSION CT chest Minimal residual curvilinear bibasilar scarring or less likely  atelectasis. Previously seen areas of more nodular consolidation  have resolved. No new acute cardiopulmonary process.  Cardiomegaly.  Electronically Signed  By: Conchita Paris M.D.  On: 01/16/2014 13:56   REC Shortness of breath is due to weight , physical deconditioning and heart issues I am not convinced that you have a pulmonary problem We will set up home physical therapy We will test her oxygen at night when he is sleeping through an overnight oxygen study Try Advair 100/50,  1 puff twice daily  Followup  6-8 weeks to report progress   OV 04/25/2014  Chief Complaint  Patient presents with  . Follow-up    Pt states SOB is improved after doing PT, and using advair.    Fu multifactorial dyspnea - she is better 25% better she says with time, advair and  PT. Doing bike exercisesa at home. Thinks advair helps. No new issues. Has had flu shot. She is siting in wheel chair in office  Review of Systems  Constitutional: Negative for fever and unexpected weight change.  HENT: Negative for congestion, dental problem, ear pain, nosebleeds, postnasal drip, rhinorrhea, sinus pressure, sneezing, sore throat and trouble swallowing.   Eyes: Negative for redness and itching.  Respiratory: Positive for shortness of breath. Negative for cough, chest tightness and wheezing.   Cardiovascular: Negative for palpitations and leg swelling.  Gastrointestinal: Negative for nausea and vomiting.  Genitourinary: Negative for dysuria.  Musculoskeletal: Negative for joint swelling.  Skin: Negative for rash.  Neurological: Negative for headaches.  Hematological: Does not  bruise/bleed easily.  Psychiatric/Behavioral: Negative for dysphoric mood. The patient is not nervous/anxious.    Current outpatient prescriptions:ALPRAZolam (XANAX) 0.5 MG tablet, Take 0.5 mg by mouth at bedtime. , Disp: , Rfl: ;  aspirin 81 MG tablet, Take 81 mg by mouth daily., Disp: , Rfl: ;  diphenoxylate-atropine (LOMOTIL) 2.5-0.025 MG per tablet, Take 1-2 tablets by mouth every 8 (eight) hours as needed for diarrhea or loose stools., Disp: , Rfl:  Fluticasone-Salmeterol (ADVAIR DISKUS) 100-50 MCG/DOSE AEPB, Inhale 1 puff into the lungs 2 (two) times daily., Disp: 14 each, Rfl: 0;  furosemide (LASIX) 40 MG tablet, 40 mg twice daily, Disp: 180 tablet, Rfl: 0;  hydrALAZINE (APRESOLINE) 25 MG tablet, Take 1 tablet (25 mg total) by mouth 3 (three) times daily., Disp: 270 tablet, Rfl: 0;  isosorbide mononitrate (IMDUR) 60 MG 24 hr tablet, TAKE 2 TABLETS BY MOUTH EVERY DAY, Disp: 180 tablet, Rfl: 0 lovastatin (MEVACOR) 10 MG tablet, Take 10 mg by mouth at bedtime. , Disp: , Rfl: ;  magnesium oxide (MAG-OX) 400 MG tablet, Take 400 mg by mouth at bedtime. , Disp: , Rfl: ;  metolazone (ZAROXOLYN) 2.5 MG tablet, Take 2.5 mg by mouth daily as needed., Disp: , Rfl: ;  metoprolol tartrate (LOPRESSOR) 25 MG tablet, TAKE 3 TABLETS BY MOUTH 3 TIMES A DAY, Disp: 256 tablet, Rfl: 1 mometasone (NASONEX) 50 MCG/ACT nasal spray, Place 2 sprays into the nose daily as needed., Disp: , Rfl: ;  nitroGLYCERIN (NITROSTAT) 0.4 MG SL tablet, Place 0.4 mg under the tongue every 5 (five) minutes as needed for chest pain., Disp: , Rfl: ;  ondansetron (ZOFRAN) 4 MG tablet, Take 1 tablet (4 mg total) by mouth every 8 (eight) hours as needed for nausea or vomiting., Disp: 40 tablet, Rfl: 2 oxyCODONE-acetaminophen (PERCOCET/ROXICET) 5-325 MG per tablet, Take 1 tablet by mouth every 6 (six) hours as needed for severe pain. For pain, Disp: , Rfl: ;  pantoprazole (PROTONIX) 40 MG tablet, Take 40 mg by mouth 2 (two) times daily. , Disp: ,  Rfl: ;  potassium chloride SA (K-DUR,KLOR-CON) 20 MEQ tablet, 2 tablets (40 mEq) twice daily, Disp: , Rfl:  warfarin (COUMADIN) 2 MG tablet, Take 2-3 mg by mouth daily at 6 PM. Takes 1 tablets (3mg ) on Mondays and Fridays.  Takes 1 tablet (2mg ) on all remaining days., Disp: , Rfl: ;  [DISCONTINUED] FLUoxetine (PROZAC) 20 MG capsule, Take 20 mg by mouth daily.  , Disp: , Rfl:       Objective:   Physical Exam   Filed Vitals:   04/25/14 1550  BP: 126/84  Pulse: 71  Temp: 98.4 F (36.9 C)  TempSrc: Oral  Height: 5\' 7"  (1.702 m)  Weight: 207 lb 9.6 oz (94.167 kg)  SpO2: 95%   Vitals  reviewed. Constitutional: She is oriented to person, place, and time. She appears well-developed and well-nourished. No distress.  Obese Sitting in wheel chair Looks deconditoned but much better than before  HENT:  Head: Normocephalic and atraumatic.  Right Ear: External ear normal.  Left Ear: External ear normal.  Mouth/Throat: Oropharynx is clear and moist. No oropharyngeal exudate.  Eyes: Conjunctivae and EOM are normal. Pupils are equal, round, and reactive to light. Right eye exhibits no discharge. Left eye exhibits no discharge. No scleral icterus.  Neck: Normal range of motion. Neck supple. No JVD present. No tracheal deviation present. No thyromegaly present.  Cardiovascular: Normal rate, regular rhythm, normal heart sounds and intact distal pulses.  Exam reveals no gallop and no friction rub.   No murmur heard. Pulmonary/Chest: Effort normal and breath sounds normal. No respiratory distress. She has no wheezes. She has no rales. She exhibits no tenderness.  Some scattered crackles as before due to atelectasis Abdominal: Soft. Bowel sounds are normal. She exhibits no distension and no mass. There is no tenderness. There is no rebound and no guarding.  Musculoskeletal: Normal range of motion. She exhibits no edema and no tenderness.  Sitting in wheel chair  Lymphadenopathy:    She has no cervical  adenopathy.  Neurological: She is alert and oriented to person, place, and time. She has normal reflexes. No cranial nerve deficit. She exhibits normal muscle tone. Coordination normal.  Skin: Skin is warm and dry. No rash noted. She is not diaphoretic. No erythema. No pallor.  Psychiatric: Judgment and thought content normal.       Assessment & Plan:  Shortness of breath is due to weight , physical deconditioning and heart issues You might have asthma contributing Glad you are some 25% better with passage of time, PT and advair Glad you had flu shot  PLAN Continue home exercise ContinueAdvair 100/50,  1 puff twice daily  Followup  6 monthsto report progress

## 2014-04-25 NOTE — Patient Instructions (Addendum)
Shortness of breath is due to weight , physical deconditioning and heart issues You might have asthma contributing Glad you are some 25% better with passage of time, PT and advair Glad you had flu shot  PLAN Continue home exercise ContinueAdvair 100/50,  1 puff twice daily  Followup  6 monthsto report progress

## 2014-05-07 ENCOUNTER — Emergency Department (HOSPITAL_COMMUNITY): Payer: Medicare Other

## 2014-05-07 ENCOUNTER — Encounter (HOSPITAL_COMMUNITY): Payer: Self-pay | Admitting: Emergency Medicine

## 2014-05-07 ENCOUNTER — Inpatient Hospital Stay (HOSPITAL_COMMUNITY)
Admission: EM | Admit: 2014-05-07 | Discharge: 2014-05-12 | DRG: 292 | Disposition: A | Discharge status: Home Health | Payer: Medicare Other | Attending: Internal Medicine | Admitting: Internal Medicine

## 2014-05-07 DIAGNOSIS — I4891 Unspecified atrial fibrillation: Secondary | ICD-10-CM

## 2014-05-07 DIAGNOSIS — L989 Disorder of the skin and subcutaneous tissue, unspecified: Secondary | ICD-10-CM | POA: Diagnosis present

## 2014-05-07 DIAGNOSIS — Z950 Presence of cardiac pacemaker: Secondary | ICD-10-CM | POA: Diagnosis not present

## 2014-05-07 DIAGNOSIS — N185 Chronic kidney disease, stage 5: Secondary | ICD-10-CM | POA: Diagnosis present

## 2014-05-07 DIAGNOSIS — I5031 Acute diastolic (congestive) heart failure: Secondary | ICD-10-CM | POA: Diagnosis present

## 2014-05-07 DIAGNOSIS — G8929 Other chronic pain: Secondary | ICD-10-CM | POA: Diagnosis present

## 2014-05-07 DIAGNOSIS — Z9849 Cataract extraction status, unspecified eye: Secondary | ICD-10-CM | POA: Diagnosis not present

## 2014-05-07 DIAGNOSIS — M129 Arthropathy, unspecified: Secondary | ICD-10-CM | POA: Diagnosis present

## 2014-05-07 DIAGNOSIS — I509 Heart failure, unspecified: Secondary | ICD-10-CM

## 2014-05-07 DIAGNOSIS — R5381 Other malaise: Secondary | ICD-10-CM | POA: Diagnosis present

## 2014-05-07 DIAGNOSIS — R0602 Shortness of breath: Secondary | ICD-10-CM

## 2014-05-07 DIAGNOSIS — N184 Chronic kidney disease, stage 4 (severe): Secondary | ICD-10-CM

## 2014-05-07 DIAGNOSIS — N179 Acute kidney failure, unspecified: Secondary | ICD-10-CM | POA: Diagnosis present

## 2014-05-07 DIAGNOSIS — Z961 Presence of intraocular lens: Secondary | ICD-10-CM | POA: Diagnosis not present

## 2014-05-07 DIAGNOSIS — Z7901 Long term (current) use of anticoagulants: Secondary | ICD-10-CM | POA: Diagnosis not present

## 2014-05-07 DIAGNOSIS — F3289 Other specified depressive episodes: Secondary | ICD-10-CM | POA: Diagnosis present

## 2014-05-07 DIAGNOSIS — J309 Allergic rhinitis, unspecified: Secondary | ICD-10-CM | POA: Diagnosis present

## 2014-05-07 DIAGNOSIS — I5033 Acute on chronic diastolic (congestive) heart failure: Secondary | ICD-10-CM | POA: Diagnosis not present

## 2014-05-07 DIAGNOSIS — T45515A Adverse effect of anticoagulants, initial encounter: Secondary | ICD-10-CM | POA: Diagnosis present

## 2014-05-07 DIAGNOSIS — G4700 Insomnia, unspecified: Secondary | ICD-10-CM | POA: Diagnosis present

## 2014-05-07 DIAGNOSIS — I252 Old myocardial infarction: Secondary | ICD-10-CM | POA: Diagnosis not present

## 2014-05-07 DIAGNOSIS — I10 Essential (primary) hypertension: Secondary | ICD-10-CM | POA: Diagnosis present

## 2014-05-07 DIAGNOSIS — K219 Gastro-esophageal reflux disease without esophagitis: Secondary | ICD-10-CM | POA: Diagnosis present

## 2014-05-07 DIAGNOSIS — I2789 Other specified pulmonary heart diseases: Secondary | ICD-10-CM | POA: Diagnosis present

## 2014-05-07 DIAGNOSIS — I120 Hypertensive chronic kidney disease with stage 5 chronic kidney disease or end stage renal disease: Secondary | ICD-10-CM | POA: Diagnosis present

## 2014-05-07 DIAGNOSIS — M549 Dorsalgia, unspecified: Secondary | ICD-10-CM | POA: Diagnosis present

## 2014-05-07 DIAGNOSIS — Z9861 Coronary angioplasty status: Secondary | ICD-10-CM | POA: Diagnosis not present

## 2014-05-07 DIAGNOSIS — E785 Hyperlipidemia, unspecified: Secondary | ICD-10-CM | POA: Diagnosis present

## 2014-05-07 DIAGNOSIS — Z96659 Presence of unspecified artificial knee joint: Secondary | ICD-10-CM | POA: Diagnosis not present

## 2014-05-07 DIAGNOSIS — F411 Generalized anxiety disorder: Secondary | ICD-10-CM | POA: Diagnosis present

## 2014-05-07 DIAGNOSIS — M109 Gout, unspecified: Secondary | ICD-10-CM | POA: Diagnosis present

## 2014-05-07 DIAGNOSIS — I739 Peripheral vascular disease, unspecified: Secondary | ICD-10-CM | POA: Diagnosis present

## 2014-05-07 DIAGNOSIS — F329 Major depressive disorder, single episode, unspecified: Secondary | ICD-10-CM | POA: Diagnosis present

## 2014-05-07 DIAGNOSIS — Z7982 Long term (current) use of aspirin: Secondary | ICD-10-CM | POA: Diagnosis not present

## 2014-05-07 DIAGNOSIS — Z87891 Personal history of nicotine dependence: Secondary | ICD-10-CM

## 2014-05-07 DIAGNOSIS — Z79899 Other long term (current) drug therapy: Secondary | ICD-10-CM | POA: Diagnosis not present

## 2014-05-07 DIAGNOSIS — E876 Hypokalemia: Secondary | ICD-10-CM

## 2014-05-07 DIAGNOSIS — I480 Paroxysmal atrial fibrillation: Secondary | ICD-10-CM

## 2014-05-07 DIAGNOSIS — I2510 Atherosclerotic heart disease of native coronary artery without angina pectoris: Secondary | ICD-10-CM

## 2014-05-07 DIAGNOSIS — I482 Chronic atrial fibrillation, unspecified: Secondary | ICD-10-CM

## 2014-05-07 DIAGNOSIS — N189 Chronic kidney disease, unspecified: Secondary | ICD-10-CM | POA: Diagnosis present

## 2014-05-07 LAB — PROTIME-INR
INR: 3.22 — ABNORMAL HIGH (ref 0.00–1.49)
PROTHROMBIN TIME: 32.9 s — AB (ref 11.6–15.2)

## 2014-05-07 LAB — CBC
HCT: 41 % (ref 36.0–46.0)
Hemoglobin: 13.4 g/dL (ref 12.0–15.0)
MCH: 29.6 pg (ref 26.0–34.0)
MCHC: 32.7 g/dL (ref 30.0–36.0)
MCV: 90.5 fL (ref 78.0–100.0)
PLATELETS: 113 10*3/uL — AB (ref 150–400)
RBC: 4.53 MIL/uL (ref 3.87–5.11)
RDW: 18.5 % — ABNORMAL HIGH (ref 11.5–15.5)
WBC: 3.5 10*3/uL — AB (ref 4.0–10.5)

## 2014-05-07 LAB — BASIC METABOLIC PANEL
ANION GAP: 14 (ref 5–15)
BUN: 51 mg/dL — ABNORMAL HIGH (ref 6–23)
CHLORIDE: 100 meq/L (ref 96–112)
CO2: 24 meq/L (ref 19–32)
Calcium: 9.5 mg/dL (ref 8.4–10.5)
Creatinine, Ser: 3.26 mg/dL — ABNORMAL HIGH (ref 0.50–1.10)
GFR calc Af Amer: 15 mL/min — ABNORMAL LOW (ref >90)
GFR calc non Af Amer: 13 mL/min — ABNORMAL LOW (ref >90)
Glucose, Bld: 103 mg/dL — ABNORMAL HIGH (ref 70–99)
Potassium: 4.4 mEq/L (ref 3.7–5.3)
Sodium: 138 mEq/L (ref 137–147)

## 2014-05-07 LAB — I-STAT TROPONIN, ED: Troponin i, poc: 0.02 ng/mL (ref 0.00–0.08)

## 2014-05-07 LAB — PRO B NATRIURETIC PEPTIDE: PRO B NATRI PEPTIDE: 11688 pg/mL — AB (ref 0–450)

## 2014-05-07 LAB — MRSA PCR SCREENING: MRSA by PCR: NEGATIVE

## 2014-05-07 MED ORDER — MAGNESIUM OXIDE 400 MG PO TABS: 400.0000 mg | ORAL_TABLET | Freq: Every day | ORAL | Status: DC

## 2014-05-07 MED ORDER — METOLAZONE 2.5 MG PO TABS
2.5000 mg | ORAL_TABLET | Freq: Two times a day (BID) | ORAL | Status: DC
  Administered 2014-05-07 – 2014-05-12 (×10): 2.5 mg via ORAL
  Filled 2014-05-07 (×11): qty 1

## 2014-05-07 MED ORDER — PANTOPRAZOLE SODIUM 40 MG PO TBEC
40.0000 mg | DELAYED_RELEASE_TABLET | Freq: Two times a day (BID) | ORAL | Status: DC
  Administered 2014-05-07 – 2014-05-12 (×10): 40 mg via ORAL
  Filled 2014-05-07 (×10): qty 1

## 2014-05-07 MED ORDER — WARFARIN - PHARMACIST DOSING INPATIENT: Freq: Every day | Status: DC

## 2014-05-07 MED ORDER — METOPROLOL TARTRATE 50 MG PO TABS
75.0000 mg | ORAL_TABLET | Freq: Three times a day (TID) | ORAL | Status: DC
  Administered 2014-05-07 – 2014-05-12 (×14): 75 mg via ORAL
  Filled 2014-05-07 (×17): qty 1

## 2014-05-07 MED ORDER — FUROSEMIDE 10 MG/ML IJ SOLN
40.0000 mg | Freq: Once | INTRAMUSCULAR | Status: AC
  Administered 2014-05-07: 40 mg via INTRAVENOUS
  Filled 2014-05-07: qty 4

## 2014-05-07 MED ORDER — SODIUM CHLORIDE 0.9 % IJ SOLN
3.0000 mL | INTRAMUSCULAR | Status: DC | PRN
  Administered 2014-05-09: 3 mL via INTRAVENOUS

## 2014-05-07 MED ORDER — ISOSORBIDE MONONITRATE ER 60 MG PO TB24
120.0000 mg | ORAL_TABLET | Freq: Every day | ORAL | Status: DC
  Filled 2014-05-07: qty 2

## 2014-05-07 MED ORDER — SIMVASTATIN 5 MG PO TABS
5.0000 mg | ORAL_TABLET | Freq: Every day | ORAL | Status: DC
  Administered 2014-05-08 – 2014-05-11 (×4): 5 mg via ORAL
  Filled 2014-05-07 (×5): qty 1

## 2014-05-07 MED ORDER — POTASSIUM CHLORIDE CRYS ER 10 MEQ PO TBCR
10.0000 meq | EXTENDED_RELEASE_TABLET | Freq: Every day | ORAL | Status: DC
  Filled 2014-05-07: qty 1

## 2014-05-07 MED ORDER — ACETAMINOPHEN 325 MG PO TABS
650.0000 mg | ORAL_TABLET | ORAL | Status: DC | PRN
  Administered 2014-05-07 – 2014-05-11 (×7): 650 mg via ORAL
  Filled 2014-05-07 (×7): qty 2

## 2014-05-07 MED ORDER — ASPIRIN 81 MG PO TABS: 81.0000 mg | ORAL_TABLET | Freq: Every day | ORAL | Status: DC

## 2014-05-07 MED ORDER — TORSEMIDE 20 MG PO TABS
20.0000 mg | ORAL_TABLET | Freq: Two times a day (BID) | ORAL | Status: DC
  Administered 2014-05-07: 20 mg via ORAL
  Filled 2014-05-07 (×3): qty 1

## 2014-05-07 MED ORDER — MOMETASONE FURO-FORMOTEROL FUM 100-5 MCG/ACT IN AERO
2.0000 | INHALATION_SPRAY | Freq: Two times a day (BID) | RESPIRATORY_TRACT | Status: DC
  Administered 2014-05-08 – 2014-05-12 (×9): 2 via RESPIRATORY_TRACT
  Filled 2014-05-07: qty 8.8

## 2014-05-07 MED ORDER — ONDANSETRON HCL 4 MG/2ML IJ SOLN
4.0000 mg | Freq: Four times a day (QID) | INTRAMUSCULAR | Status: DC | PRN
  Administered 2014-05-08: 4 mg via INTRAVENOUS
  Filled 2014-05-07: qty 2

## 2014-05-07 MED ORDER — ASPIRIN 81 MG PO CHEW
81.0000 mg | CHEWABLE_TABLET | Freq: Every day | ORAL | Status: DC
  Administered 2014-05-08 – 2014-05-12 (×5): 81 mg via ORAL
  Filled 2014-05-07 (×5): qty 1

## 2014-05-07 MED ORDER — SODIUM CHLORIDE 0.9 % IV SOLN: 250.0000 mL | INTRAVENOUS | Status: DC | PRN

## 2014-05-07 MED ORDER — WARFARIN SODIUM 2 MG PO TABS: 2.0000 mg | ORAL_TABLET | Freq: Every day | ORAL | Status: DC

## 2014-05-07 MED ORDER — ALPRAZOLAM 0.5 MG PO TABS
0.5000 mg | ORAL_TABLET | Freq: Every day | ORAL | Status: DC
  Administered 2014-05-07 – 2014-05-11 (×5): 0.5 mg via ORAL
  Filled 2014-05-07 (×5): qty 1

## 2014-05-07 MED ORDER — ONDANSETRON HCL 4 MG/2ML IJ SOLN
4.0000 mg | Freq: Once | INTRAMUSCULAR | Status: AC
  Administered 2014-05-07: 4 mg via INTRAVENOUS
  Filled 2014-05-07: qty 2

## 2014-05-07 MED ORDER — NITROGLYCERIN 0.4 MG SL SUBL: 0.4000 mg | SUBLINGUAL_TABLET | SUBLINGUAL | Status: DC | PRN

## 2014-05-07 MED ORDER — MAGNESIUM OXIDE 400 (241.3 MG) MG PO TABS
400.0000 mg | ORAL_TABLET | Freq: Every day | ORAL | Status: DC
  Administered 2014-05-07 – 2014-05-11 (×5): 400 mg via ORAL
  Filled 2014-05-07 (×6): qty 1

## 2014-05-07 MED ORDER — SODIUM CHLORIDE 0.9 % IJ SOLN
3.0000 mL | Freq: Two times a day (BID) | INTRAMUSCULAR | Status: DC
  Administered 2014-05-07 – 2014-05-12 (×9): 3 mL via INTRAVENOUS

## 2014-05-07 NOTE — Progress Notes (Signed)
ANTICOAGULATION CONSULT NOTE - Initial Consult  Pharmacy Consult for warfarin  Indication: atrial fibrillation  Allergies  Allergen Reactions  . Amiodarone Hives  . Propylthiouracil Rash    Patient Measurements: Height: 5\' 7"  (170.2 cm) Weight: 213 lb 11.2 oz (96.934 kg) IBW/kg (Calculated) : 61.6 Heparin Dosing Weight:   Vital Signs: Temp: 98 F (36.7 C) (09/23 2101) Temp src: Oral (09/23 2101) BP: 140/67 mmHg (09/23 2101) Pulse Rate: 60 (09/23 2101)  Labs:  Recent Labs  05/07/14 1659  HGB 13.4  HCT 41.0  PLT 113*  LABPROT 32.9*  INR 3.22*  CREATININE 3.26*    Estimated Creatinine Clearance: 17.3 ml/min (by C-G formula based on Cr of 3.26).   Medical History: Past Medical History  Diagnosis Date  . Cellulitis and abscess of unspecified site     a. h/o MSRA cellulitis of abdominal wall.  Marland Kitchen CAD (coronary artery disease)     a. DES to OM 01/2005. b. Rotational atherotomy/DES to prox LAD 02/2008. c. Cath 07/2013: stable, for med rx.  . Edema   . Warfarin anticoagulation     a. Followed by PCP.  Marland Kitchen GI bleed     a. EGD 01/2007: antral ulcers related to NSAID/aspirin use.  . Peripheral vascular disease   . Hyperthyroidism     a. Hx of intolerance to PTU.  Marland Kitchen Blood transfusion years ago    "heart problems" (09/26/2013)  . H/O hiatal hernia   . Umbilical hernia     unrepaired (11/24/11)  . Valvular heart disease     Echo 11/2011: mild-mod MR.  Marland Kitchen Hypertension     takes Amlodipine,Hydralazine,and Imdur daily  . Hyperlipidemia     takes Lovastatin daily  . SSS (sick sinus syndrome)     a. H/o AV node ablation 2008 secondary to difficult to control afib/flutter, with subsequent pacer.  . Atrial fibrillation with rapid ventricular response     a. H/o amiodarone thyroiditis. b. s/p AV node ablation 2008 with implantation of PPM 01/2007, upgraded to CRT-P 06/2007.;takes Coumadin daily  . GERD (gastroesophageal reflux disease)     takes Protonix daily  . Chronic  diastolic CHF (congestive heart failure)     takes Lasix daily and Spironolactone   . Pacemaker     [employer]  . History of gout   . Bruises easily   . Hemorrhoids   . Depression     takes Xanax daily  . Insomnia     takes Xanax if needed  . Heart murmur   . Chronic renal insufficiency     Cr ~2.3.  . Polycystic kidney disease     a. Per remote E-Chart records.  . Kidney stones   . Myocardial infarction     "I've had 2-3; last one was in /1996" (09/26/2013)  . Exertional shortness of breath   . Migraines     "last one was a pretty good while ago" (09/26/2013)  . Arthritis     "all over" (09/26/2013)  . Chronic lower back pain     deteriorating disc  . Anxiety   . Iron deficiency anemia     takes an Iron Pill daily  . Pneumonia    Assessment: 79 year old female with history of afib on coumadin. INR above goal tonight at 3.2, no bleeding issues noted. Will hold dose tonight and follow up with daily INR.  Warfarin dose pta: 2mg  daily except 3mg  on Monday/Fridays  Goal of Therapy:  INR 2-3 Monitor platelets by anticoagulation protocol:  Yes   Plan:  Hold warfarin tonight Daily INR  Erin Hearing PharmD., BCPS Clinical Pharmacist Pager [phone] 05/07/2014 10:27 PM

## 2014-05-07 NOTE — Progress Notes (Signed)
Admitted pt from the ED per stretcher. Alert, oriented x4 with O2 at 2L per [place]. Denies chest pain, denies nausea and vomiting, not in respiratory distress. Put on telemetry box 7 and CMT aware. Oriented to room and call bell. Advised on strict I&O's. Will continue to monitor pt  05/07/14 2101  Vitals  Temp 98 F (36.7 C)  Temp src Oral  BP 140/67 mmHg  BP Location Left arm  BP Method Automatic  Patient Position (if appropriate) Lying  Pulse Rate 60  Pulse Rate Source Dinamap  Resp 18  Oxygen Therapy  SpO2 100 %  O2 Device Nasal cannula  O2 Flow Rate (L/min) 1 L/min  Height and Weight  Height 5\' 7"  (1.702 m)  Weight 96.934 kg (213 lb 11.2 oz)  Type of Scale Used Standing  Type of Weight Actual  BSA (Calculated - sq m) 2.14 sq meters  BMI (Calculated) 33.5  Weight in (lb) to have BMI = 25 159.3

## 2014-05-07 NOTE — H&P (Signed)
[facility] Admission History and Physical       [name] ZOX:[id number] DOB: 05/11/1935 DOA: 05/07/2014  Referring physician: EDP PCP: Tamsen Roers, MD  Specialists:   Chief Complaint: SOB  HPI: [name] is a 79 y.o. female with a history of CAD, Diastolic CHF, P.A.Fib, S/P Pacemaker, and CKD who presents to the ED with complaints of increased SOB and worsening Edema over the past 2 weeks.  She reports having swelling around her ABD as well.   She was advised to increase her lasix but her symptoms continued to worsen.  She reports a 20 pound weight gain over the past month.   She was evalauted in the ED and found to have a BNP =11688.0, and a Chest X-ray revealing CHF.  Her last 2D Echo was in 03/2013.    She was referred for medical admission.      Review of Systems:  Constitutional: No Weight Loss, +Weight Gain, Night Sweats, Fevers, Chills, Dizziness, Fatigue, or Generalized Weakness HEENT: No Headaches, Difficulty Swallowing,Tooth/Dental Problems,Sore Throat,  No Sneezing, Rhinitis, Ear Ache, Nasal Congestion, or Post Nasal Drip,  Cardio-vascular:  No Chest pain, Orthopnea, PND, +Edema in Lower Extremities, Anasarca, Dizziness, Palpitations  Resp: +Dyspnea, +DOE, No Cough, No Hemoptysis, No Wheezing.    GI: No Heartburn, Indigestion, Abdominal Pain, Nausea, Vomiting, Diarrhea, Hematemesis, Hematochezia, Melena, Change in Bowel Habits,  Loss of Appetite  GU: No Dysuria, Change in Color of Urine, No Urgency or Frequency, No Flank pain.  Musculoskeletal: No Joint Pain or Swelling, No Decreased Range of Motion, No Back Pain.  Neurologic: No Syncope, No Seizures, Muscle Weakness, Paresthesia, Vision Disturbance or Loss, No Diplopia, No Vertigo, No Difficulty Walking,  Skin: No Rash or Lesions. Psych: No Change in Mood or Affect, No Depression or Anxiety, No Memory loss, No Confusion, or Hallucinations   Past Medical History  Diagnosis Date  . Cellulitis and  abscess of unspecified site     a. h/o MSRA cellulitis of abdominal wall.  Marland Kitchen CAD (coronary artery disease)     a. DES to OM 01/2005. b. Rotational atherotomy/DES to prox LAD 02/2008. c. Cath 07/2013: stable, for med rx.  . Edema   . Warfarin anticoagulation     a. Followed by PCP.  Marland Kitchen GI bleed     a. EGD 01/2007: antral ulcers related to NSAID/aspirin use.  . Peripheral vascular disease   . Hyperthyroidism     a. Hx of intolerance to PTU.  Marland Kitchen Blood transfusion years ago    "heart problems" (09/26/2013)  . H/O hiatal hernia   . Umbilical hernia     unrepaired (11/24/11)  . Valvular heart disease     Echo 11/2011: mild-mod MR.  Marland Kitchen Hypertension     takes Amlodipine,Hydralazine,and Imdur daily  . Hyperlipidemia     takes Lovastatin daily  . SSS (sick sinus syndrome)     a. H/o AV node ablation 2008 secondary to difficult to control afib/flutter, with subsequent pacer.  . Atrial fibrillation with rapid ventricular response     a. H/o amiodarone thyroiditis. b. s/p AV node ablation 2008 with implantation of PPM 01/2007, upgraded to CRT-P 06/2007.;takes Coumadin daily  . GERD (gastroesophageal reflux disease)     takes Protonix daily  . Chronic diastolic CHF (congestive heart failure)     takes Lasix daily and Spironolactone   . Pacemaker     [employer]tronic  . History of gout   . Bruises easily   . Hemorrhoids   .  Depression     takes Xanax daily  . Insomnia     takes Xanax if needed  . Heart murmur   . Chronic renal insufficiency     Cr ~2.3.  . Polycystic kidney disease     a. Per remote E-Chart records.  . Kidney stones   . Myocardial infarction     "I've had 2-3; last one was in /1996" (09/26/2013)  . Exertional shortness of breath   . Migraines     "last one was a pretty good while ago" (09/26/2013)  . Arthritis     "all over" (09/26/2013)  . Chronic lower back pain     deteriorating disc  . Anxiety   . Iron deficiency anemia     takes an Iron Pill daily  . Pneumonia        Past Surgical History  Procedure Laterality Date  . Esophagogastroduodenoscopy    . Tonsillectomy  ~ 1960  . Laminectomy and microdiscectomy lumbar spine  08/2003  . Cardioversion  10/2004; 02/2007    /E-chart  . Cholecystectomy    . Appendectomy  1954  . Vaginal hysterectomy  ?1981  . Av node ablation  02/2007    /E-chart  . Insert / replace / remove pacemaker  01/2007    initial placement  . Insert / replace / remove pacemaker  06/2007    upgraded/E-chart  . Av fistula placement, brachiocephalic Right 08/624    "didn't work; my veins were too small"  . Joint replacement    . Total knee arthroplasty Left 01/2009  . Bladder and bowel  1970's    "tacked; damaged my intestines & had to remove some; done @ [facility]  . Cataract extraction w/ intraocular lens  implant, bilateral Bilateral   . Bascilic vein transposition Right 06/21/2013    Procedure: [place];  Surgeon: Serafina Mitchell, MD;  Location: [facility] OR;  Service: Vascular;  Laterality: Right;  . Esophagogastroduodenoscopy N/A 07/19/2013    Procedure: ESOPHAGOGASTRODUODENOSCOPY (EGD);  Surgeon: Lafayette Dragon, MD;  Location: [facility] ENDOSCOPY;  Service: Endoscopy;  Laterality: N/A;  . Balloon dilation N/A 07/19/2013    Procedure: BALLOON DILATION;  Surgeon: Lafayette Dragon, MD;  Location: [facility] ENDOSCOPY;  Service: Endoscopy;  Laterality: N/A;  . Esophagogastroduodenoscopy N/A 07/20/2013    Procedure: ESOPHAGOGASTRODUODENOSCOPY (EGD);  Surgeon: Lafayette Dragon, MD;  Location: [facility] ENDOSCOPY;  Service: Endoscopy;  Laterality: N/A;  with removal of polyp in pylorus  . Bascilic vein transposition Right 09/20/2013    Procedure: 2ND STAGE RIGHT BASCILIC VEIN TRANSPOSITION; ULTRSOUND GUIDED;  Surgeon: Serafina Mitchell, MD;  Location: [facility] OR;  Service: Vascular;  Laterality: Right;  . Cardiac catheterization  2006; 2008; 2009; 01/13/2011  . Coronary angioplasty with stent placement  ~ 2007; ?    "had a total of 2 stents put  in" 92/07/2014)  . Back surgery         Prior to Admission medications   Medication Sig Start Date End Date Taking? Authorizing Provider  ALPRAZolam Duanne Moron) 0.5 MG tablet Take 0.5 mg by mouth at bedtime.    Yes Historical Provider, MD  aspirin 81 MG tablet Take 81 mg by mouth daily.   Yes Historical Provider, MD  diphenoxylate-atropine (LOMOTIL) 2.5-0.025 MG per tablet Take 1-2 tablets by mouth every 8 (eight) hours as needed for diarrhea or loose stools.   Yes Historical Provider, MD  Fluticasone-Salmeterol (ADVAIR DISKUS) 100-50 MCG/DOSE AEPB Inhale 1 puff into the lungs 2 (two)  times daily. 01/27/14  Yes Brand Males, MD  furosemide (LASIX) 40 MG tablet Take 40 mg by mouth 2 (two) times daily. 04/08/14  Yes Josue Hector, MD  hydrALAZINE (APRESOLINE) 25 MG tablet Take 1 tablet (25 mg total) by mouth 3 (three) times daily. 04/08/14  Yes Josue Hector, MD  isosorbide mononitrate (IMDUR) 60 MG 24 hr tablet Take 120 mg by mouth daily. 04/08/14  Yes Josue Hector, MD  lovastatin (MEVACOR) 10 MG tablet Take 10 mg by mouth at bedtime.    Yes Historical Provider, MD  magnesium oxide (MAG-OX) 400 MG tablet Take 400 mg by mouth at bedtime.    Yes Historical Provider, MD  metolazone (ZAROXOLYN) 2.5 MG tablet Take 2.5 mg by mouth daily as needed (edema).    Yes Historical Provider, MD  metoprolol tartrate (LOPRESSOR) 25 MG tablet Take 75 mg by mouth 3 (three) times daily.   Yes Historical Provider, MD  mometasone (NASONEX) 50 MCG/ACT nasal spray Place 2 sprays into the nose daily as needed (for congrestion).    Yes Historical Provider, MD  nitroGLYCERIN (NITROSTAT) 0.4 MG SL tablet Place 0.4 mg under the tongue every 5 (five) minutes as needed for chest pain.   Yes Historical Provider, MD  ondansetron (ZOFRAN) 4 MG tablet Take 1 tablet (4 mg total) by mouth every 8 (eight) hours as needed for nausea or vomiting. 10/28/13  Yes Amy S Esterwood, PA-C  oxyCODONE-acetaminophen (PERCOCET/ROXICET) 5-325 MG  per tablet Take 1 tablet by mouth every 6 (six) hours as needed for severe pain. For pain 09/20/13  Yes Samantha J Rhyne, PA-C  pantoprazole (PROTONIX) 40 MG tablet Take 40 mg by mouth 2 (two) times daily.    Yes Historical Provider, MD  potassium chloride SA (K-DUR,KLOR-CON) 20 MEQ tablet 2 tablets (40 mEq) twice daily 11/22/13  Yes Liliane Shi, PA-C  warfarin (COUMADIN) 2 MG tablet Take 2-3 mg by mouth daily at 6 PM. Takes 1 tablets (3mg ) on Mondays and Fridays.  Takes 1 tablet (2mg ) on all remaining days.    Historical Provider, MD      Allergies  Allergen Reactions  . Amiodarone Hives  . Propylthiouracil Rash     Social History:  reports that she quit smoking about 18 years ago. Her smoking use included Cigarettes. She has a 50 pack-year smoking history. She has never used smokeless tobacco. She reports that she does not drink alcohol or use illicit drugs.     Family History  Problem Relation Age of Onset  . Colon cancer Neg Hx   . Thyroid disease Neg Hx   . Stroke Brother   . Hypertension Brother   . Kidney disease Brother   . Kidney disease Brother   . Other Mother     Sepsis and perforated colon  . Kidney disease Mother   . Other Father     Motor vehicle accident  . Learning disabilities Son   . Kidney disease Maternal Aunt   . Kidney disease Maternal Grandmother        Physical Exam:  GEN:  Pleasant  79 y.o. female  examined  and in no acute distress; cooperative with exam Filed Vitals:   05/07/14 1659 05/07/14 2101  BP: 127/50 140/67  Pulse: 62 60  Temp: 97.7 F (36.5 C) 98 F (36.7 C)  TempSrc: Oral Oral  Resp: 15 18  Height: 5\' 7"  (1.702 m) 5\' 7"  (1.702 m)  Weight: 96.163 kg (212 lb) 96.934 kg (213 lb 11.2  oz)  SpO2: 94% 100%   Blood pressure 140/67, pulse 60, temperature 98 F (36.7 C), temperature source Oral, resp. rate 18, height 5\' 7"  (1.702 m), weight 96.934 kg (213 lb 11.2 oz), SpO2 100.00%. PSYCH: She is alert and oriented x4; does not appear  anxious does not appear depressed; affect is normal HEENT: Normocephalic and Atraumatic, Mucous membranes pink; PERRLA; EOM intact; Fundi:  Benign;  No scleral icterus, Nares: Patent, Oropharynx: Clear, Fair Dentition,    Neck:  FROM, No Cervical Lymphadenopathy nor Thyromegaly or Carotid Bruit; No JVD; Breasts:: Not examined CHEST WALL: No tenderness CHEST: Normal respiration, clear to auscultation bilaterally HEART: Regular rate and rhythm; no murmurs rubs or gallops BACK: No kyphosis or scoliosis; No CVA tenderness ABDOMEN: Positive Bowel Sounds, Obese, Soft Non-Tender; No Masses, No Organomegaly. Rectal Exam: Not done EXTREMITIES: No Cyanosis, Clubbing, 2+ BLE Edema; No Ulcerations. Genitalia: not examined PULSES: 2+ and symmetric SKIN: Normal hydration no rash or ulceration CNS: alert and Oriented x 4, No focal Deficits  Vascular: pulses palpable throughout    Labs on Admission:  Basic Metabolic Panel:  Recent Labs Lab 05/07/14 1659  NA 138  K 4.4  CL 100  CO2 24  GLUCOSE 103*  BUN 51*  CREATININE 3.26*  CALCIUM 9.5   Liver Function Tests: No results found for this basename: AST, ALT, ALKPHOS, BILITOT, PROT, ALBUMIN,  in the last 168 hours No results found for this basename: LIPASE, AMYLASE,  in the last 168 hours No results found for this basename: AMMONIA,  in the last 168 hours CBC:  Recent Labs Lab 05/07/14 1659  WBC 3.5*  HGB 13.4  HCT 41.0  MCV 90.5  PLT 113*   Cardiac Enzymes: No results found for this basename: CKTOTAL, CKMB, CKMBINDEX, TROPONINI,  in the last 168 hours  BNP (last 3 results)  Recent Labs  10/04/13 1416 10/28/13 1450 05/07/14 1659  PROBNP 1143.0* 1368.0* 11688.0*   CBG: No results found for this basename: GLUCAP,  in the last 168 hours  Radiological Exams on Admission: Dg Chest 2 View  05/07/2014   CLINICAL DATA:  One month history of cough and shortness of breath with onset of emesis postprandially over the past 2-3 weeks  ; chronic renal insufficiency nearing dialysis  EXAM: CHEST  2 VIEW  COMPARISON:  Portable chest x-ray of September 26, 2013  FINDINGS: The right lung is mildly hypoinflated. A possibly secondary to chronically elevated hemidiaphragm. There is no alveolar infiltrate. The interstitial markings are slightly increased overall. The cardiac silhouette is enlarged. The pulmonary vascularity is prominent centrally. There is no significant pleural effusion. The permanent pacemaker electrodes are in appropriate position. The bony thorax exhibits no acute abnormalities.  IMPRESSION: The study is limited due to hypoinflation of the right lung. This appears chronic and may be related to hemidiaphragm weakness or paralysis. Low-grade compensated CHF is suspected.   Electronically Signed   By: David  Martinique   On: 05/07/2014 18:31     EKG:   Ventricular paced   Rate =72, and low Voltage.      Assessment/Plan:   79 y.o. female with    Principal Problem:   1.   Acute diastolic CHF (congestive heart failure)/ SOB (shortness of breath)   CHF Protocol- diurese with Demadex and Zaroxylyn   Continue Betablocker, and NO Ace INhibor Rx due to CKD    2.   HYPERTENSION   Continue Metoprolol Rx as  BP tolerates,  Hold Hydralazine due to low  BP     Since Diuretics have been increased    3.   CAD, NATIVE VESSEL   stable on Metoprolol Rx, ASA, Statin Rx and Imdur Rx    4.   Acute-on-chronic kidney injury   Monitor BUN/Cr    5.   A-fib   Continue Coumadin RX   Pharmacy Adjustment PRN`     6.   HLD (hyperlipidemia)   continue Statin Rx    7.   CKD (chronic kidney disease)   Monitor BUN/Cr     8.   Long term (current) use of anticoagulants   Continue coumadin rx.       Code Status:   FULL CODE Family Communication:    No Family Present Disposition Plan:     Inpatient  Time spent: War[place] C [facility] Pager [phone]   If [place] Please Contact the Day Rounding Team MD  for [facility]  If 7PM-7AM, Please Contact night-coverage  [email or web address] Password [facility] 05/07/2014, 10:17 PM

## 2014-05-07 NOTE — ED Notes (Addendum)
Pt reports n/v x 2 weeks. Denies diarrhea. Having sob, fatigue and swelling to abd. Airway is intact, speaking in full sentences, spo2 94% at triage. ekg being done. Pt takes coumadin, reports having bleeding from left ear when she lays down, x 2 weeks.

## 2014-05-07 NOTE — ED Provider Notes (Signed)
CSN: [id number]     Arrival date & time 05/07/14  1639 History   First MD Initiated Contact with Patient 05/07/14 1856     Chief Complaint  Patient presents with  . Shortness of Breath  . Emesis     (Consider location/radiation/quality/duration/timing/severity/associated sxs/prior Treatment) The history is provided by the patient.  [name] is a 79 y.o. female hx of CAD, afib on coumadin, CHF with diastolic dysfunction here with shortness of breath, nausea, vomiting. Hasn't been eating much for the last 2 weeks. However, had 20 pound weight gain. Also feeling more short of breath. Legs chronically swollen, not getting worse. Took lasix 40 mg but not improved.    Past Medical History  Diagnosis Date  . Cellulitis and abscess of unspecified site     a. h/o MSRA cellulitis of abdominal wall.  Marland Kitchen CAD (coronary artery disease)     a. DES to OM 01/2005. b. Rotational atherotomy/DES to prox LAD 02/2008. c. Cath 07/2013: stable, for med rx.  . Edema   . Warfarin anticoagulation     a. Followed by PCP.  Marland Kitchen GI bleed     a. EGD 01/2007: antral ulcers related to NSAID/aspirin use.  . Peripheral vascular disease   . Hyperthyroidism     a. Hx of intolerance to PTU.  Marland Kitchen Blood transfusion years ago    "heart problems" (09/26/2013)  . H/O hiatal hernia   . Umbilical hernia     unrepaired (11/24/11)  . Valvular heart disease     Echo 11/2011: mild-mod MR.  Marland Kitchen Hypertension     takes Amlodipine,Hydralazine,and Imdur daily  . Hyperlipidemia     takes Lovastatin daily  . SSS (sick sinus syndrome)     a. H/o AV node ablation 2008 secondary to difficult to control afib/flutter, with subsequent pacer.  . Atrial fibrillation with rapid ventricular response     a. H/o amiodarone thyroiditis. b. s/p AV node ablation 2008 with implantation of PPM 01/2007, upgraded to CRT-P 06/2007.;takes Coumadin daily  . GERD (gastroesophageal reflux disease)     takes Protonix daily  . Chronic diastolic CHF  (congestive heart failure)     takes Lasix daily and Spironolactone   . Pacemaker     Medtronic  . History of gout   . Bruises easily   . Hemorrhoids   . Depression     takes Xanax daily  . Insomnia     takes Xanax if needed  . Heart murmur   . Chronic renal insufficiency     Cr ~2.3.  . Polycystic kidney disease     a. Per remote E-Chart records.  . Kidney stones   . Myocardial infarction     "I've had 2-3; last one was in /1996" (09/26/2013)  . Exertional shortness of breath   . Migraines     "last one was a pretty good while ago" (09/26/2013)  . Arthritis     "all over" (09/26/2013)  . Chronic lower back pain     deteriorating disc  . Anxiety   . Iron deficiency anemia     takes an Iron Pill daily  . Pneumonia    Past Surgical History  Procedure Laterality Date  . Esophagogastroduodenoscopy    . Tonsillectomy  ~ 1960  . Laminectomy and microdiscectomy lumbar spine  08/2003  . Cardioversion  10/2004; 02/2007    /E-chart  . Cholecystectomy    . Appendectomy  1954  . Vaginal hysterectomy  ?1981  . Av node  ablation  02/2007    /E-chart  . Insert / replace / remove pacemaker  01/2007    initial placement  . Insert / replace / remove pacemaker  06/2007    upgraded/E-chart  . Av fistula placement, brachiocephalic Right 11/2593    "didn't work; my veins were too small"  . Joint replacement    . Total knee arthroplasty Left 01/2009  . Bladder and bowel  1970's    "tacked; damaged my intestines & had to remove some; done @ [facility]  . Cataract extraction w/ intraocular lens  implant, bilateral Bilateral   . Bascilic vein transposition Right 06/21/2013    Procedure: [place];  Surgeon: Serafina Mitchell, MD;  Location: [facility] OR;  Service: Vascular;  Laterality: Right;  . Esophagogastroduodenoscopy N/A 07/19/2013    Procedure: ESOPHAGOGASTRODUODENOSCOPY (EGD);  Surgeon: Lafayette Dragon, MD;  Location: [facility] ENDOSCOPY;  Service: Endoscopy;  Laterality: N/A;   . Balloon dilation N/A 07/19/2013    Procedure: BALLOON DILATION;  Surgeon: Lafayette Dragon, MD;  Location[facility] ENDOSCOPY;  Service: Endoscopy;  Laterality: N/A;  . Esophagogastroduodenoscopy N/A 07/20/2013    Procedure: ESOPHAGOGASTRODUODENOSCOPY (EGD);  Surgeon: Lafayette Dragon, MD;  Location: [facility] ENDOSCOPY;  Service: Endoscopy;  Laterality: N/A;  with removal of polyp in pylorus  . Bascilic vein transposition Right 09/20/2013    Procedure: 2ND STAGE RIGHT BASCILIC VEIN TRANSPOSITION; ULTRSOUND GUIDED;  Surgeon: Serafina Mitchell, MD;  Location: [facility] OR;  Service: Vascular;  Laterality: Right;  . Cardiac catheterization  2006; 2008; 2009; 01/13/2011  . Coronary angioplasty with stent placement  ~ 2007; ?    "had a total of 2 stents put in" 92/07/2014)  . Back surgery     Family History  Problem Relation Age of Onset  . Colon cancer Neg Hx   . Thyroid disease Neg Hx   . Stroke Brother   . Hypertension Brother   . Kidney disease Brother   . Kidney disease Brother   . Other Mother     Sepsis and perforated colon  . Kidney disease Mother   . Other Father     Motor vehicle accident  . Learning disabilities Son   . Kidney disease Maternal Aunt   . Kidney disease Maternal Grandmother    History  Substance Use Topics  . Smoking status: Former Smoker -- 1.00 packs/day for 50 years    Types: Cigarettes    Quit date: 07/13/1995  . Smokeless tobacco: Never Used     Comment: quit smoking around 1996  . Alcohol Use: No   OB History   Grav Para Term Preterm Abortions TAB SAB Ect Mult Living                 Review of Systems  Respiratory: Positive for shortness of breath.   Gastrointestinal: Positive for vomiting.  All other systems reviewed and are negative.     Allergies  Amiodarone and Propylthiouracil  Home Medications   Prior to Admission medications   Medication Sig Start Date End Date Taking? Authorizing Provider  ALPRAZolam Duanne Moron) 0.5 MG tablet Take 0.5 mg by mouth at bedtime.      Historical Provider, MD  aspirin 81 MG tablet Take 81 mg by mouth daily.    Historical Provider, MD  diphenoxylate-atropine (LOMOTIL) 2.5-0.025 MG per tablet Take 1-2 tablets by mouth every 8 (eight) hours as needed for diarrhea or loose stools.    Historical Provider, MD  Fluticasone-Salmeterol (ADVAIR DISKUS) 100-50 MCG/DOSE  AEPB Inhale 1 puff into the lungs 2 (two) times daily. 01/27/14   Brand Males, MD  furosemide (LASIX) 40 MG tablet 40 mg twice daily 04/08/14   Josue Hector, MD  hydrALAZINE (APRESOLINE) 25 MG tablet Take 1 tablet (25 mg total) by mouth 3 (three) times daily. 04/08/14   Josue Hector, MD  isosorbide mononitrate (IMDUR) 60 MG 24 hr tablet TAKE 2 TABLETS BY MOUTH EVERY DAY 04/08/14   Josue Hector, MD  lovastatin (MEVACOR) 10 MG tablet Take 10 mg by mouth at bedtime.     Historical Provider, MD  magnesium oxide (MAG-OX) 400 MG tablet Take 400 mg by mouth at bedtime.     Historical Provider, MD  metolazone (ZAROXOLYN) 2.5 MG tablet Take 2.5 mg by mouth daily as needed.    Historical Provider, MD  metoprolol tartrate (LOPRESSOR) 25 MG tablet TAKE 3 TABLETS BY MOUTH 3 TIMES A DAY 04/23/14   Evans Lance, MD  mometasone (NASONEX) 50 MCG/ACT nasal spray Place 2 sprays into the nose daily as needed.    Historical Provider, MD  nitroGLYCERIN (NITROSTAT) 0.4 MG SL tablet Place 0.4 mg under the tongue every 5 (five) minutes as needed for chest pain.    Historical Provider, MD  ondansetron (ZOFRAN) 4 MG tablet Take 1 tablet (4 mg total) by mouth every 8 (eight) hours as needed for nausea or vomiting. 10/28/13   Amy S Esterwood, PA-C  oxyCODONE-acetaminophen (PERCOCET/ROXICET) 5-325 MG per tablet Take 1 tablet by mouth every 6 (six) hours as needed for severe pain. For pain 09/20/13   Hulen Shouts Rhyne, PA-C  pantoprazole (PROTONIX) 40 MG tablet Take 40 mg by mouth 2 (two) times daily.     Historical Provider, MD  potassium chloride SA (K-DUR,KLOR-CON) 20 MEQ tablet 2 tablets (40 mEq)  twice daily 11/22/13   Liliane Shi, PA-C  warfarin (COUMADIN) 2 MG tablet Take 2-3 mg by mouth daily at 6 PM. Takes 1 tablets (3mg ) on Mondays and Fridays.  Takes 1 tablet (2mg ) on all remaining days.    Historical Provider, MD   BP 127/50  Pulse 62  Temp(Src) 97.7 F (36.5 C) (Oral)  Resp 15  Ht 5\' 7"  (1.702 m)  Wt 212 lb (96.163 kg)  BMI 33.20 kg/m2  SpO2 94% Physical Exam  Nursing note and vitals reviewed. Constitutional: She is oriented to person, place, and time.  tachypneic   HENT:  Head: Normocephalic.  Mouth/Throat: Oropharynx is clear and moist.  Eyes: Conjunctivae and EOM are normal. Pupils are equal, round, and reactive to light.  Neck: Normal range of motion. Neck supple.  Cardiovascular: Normal rate, regular rhythm and normal heart sounds.   Pulmonary/Chest:  Diminished breath sounds bilateral bases   Abdominal: Soft. Bowel sounds are normal. She exhibits no distension. There is no tenderness. There is no rebound.  Musculoskeletal:  1+ edema bilaterally   Neurological: She is alert and oriented to person, place, and time.  Skin: Skin is warm and dry.  Psychiatric: She has a normal mood and affect. Her behavior is normal. Judgment and thought content normal.    ED Course  Procedures (including critical care time) Labs Review Labs Reviewed  CBC - Abnormal; Notable for the following:    WBC 3.5 (*)    RDW 18.5 (*)    Platelets 113 (*)    All other components within normal limits  BASIC METABOLIC PANEL - Abnormal; Notable for the following:    Glucose, Bld 103 (*)  BUN 51 (*)    Creatinine, Ser 3.26 (*)    GFR calc non Af Amer 13 (*)    GFR calc Af Amer 15 (*)    All other components within normal limits  PRO B NATRIURETIC PEPTIDE - Abnormal; Notable for the following:    Pro B Natriuretic peptide (BNP) 11688.0 (*)    All other components within normal limits  PROTIME-INR - Abnormal; Notable for the following:    Prothrombin Time 32.9 (*)    INR 3.22  (*)    All other components within normal limits  I-STAT TROPOININ, ED    Imaging Review Dg Chest 2 View  05/07/2014   CLINICAL DATA:  One month history of cough and shortness of breath with onset of emesis postprandially over the past 2-3 weeks ; chronic renal insufficiency nearing dialysis  EXAM: CHEST  2 VIEW  COMPARISON:  Portable chest x-ray of September 26, 2013  FINDINGS: The right lung is mildly hypoinflated. A possibly secondary to chronically elevated hemidiaphragm. There is no alveolar infiltrate. The interstitial markings are slightly increased overall. The cardiac silhouette is enlarged. The pulmonary vascularity is prominent centrally. There is no significant pleural effusion. The permanent pacemaker electrodes are in appropriate position. The bony thorax exhibits no acute abnormalities.  IMPRESSION: The study is limited due to hypoinflation of the right lung. This appears chronic and may be related to hemidiaphragm weakness or paralysis. Low-grade compensated CHF is suspected.   Electronically Signed   By: Vinay Ertl  [place]   On: 05/07/2014 18:31     EKG Interpretation None      MDM   Final diagnoses:  None    [name] is a 79 y.o. female here with shortness of breath, weight gain. Likely CHF exacerbation. BNP 11000. CXR showed CHF. Given lasix. Will admit to tele.      Wandra Arthurs, MD 05/07/14 [phone]

## 2014-05-07 NOTE — Progress Notes (Signed)
Dr. Arnoldo Morale text paged of this admission.

## 2014-05-08 ENCOUNTER — Encounter (HOSPITAL_COMMUNITY): Payer: Self-pay | Admitting: General Practice

## 2014-05-08 DIAGNOSIS — I5033 Acute on chronic diastolic (congestive) heart failure: Principal | ICD-10-CM

## 2014-05-08 DIAGNOSIS — I369 Nonrheumatic tricuspid valve disorder, unspecified: Secondary | ICD-10-CM

## 2014-05-08 LAB — BASIC METABOLIC PANEL
Anion gap: 15 (ref 5–15)
BUN: 51 mg/dL — AB (ref 6–23)
CALCIUM: 9.4 mg/dL (ref 8.4–10.5)
CO2: 21 mEq/L (ref 19–32)
CREATININE: 3.2 mg/dL — AB (ref 0.50–1.10)
Chloride: 101 mEq/L (ref 96–112)
GFR calc non Af Amer: 13 mL/min — ABNORMAL LOW (ref >90)
GFR, EST AFRICAN AMERICAN: 15 mL/min — AB (ref >90)
GLUCOSE: 88 mg/dL (ref 70–99)
POTASSIUM: 4.4 meq/L (ref 3.7–5.3)
Sodium: 137 mEq/L (ref 137–147)

## 2014-05-08 LAB — TSH: TSH: 6.53 u[IU]/mL — ABNORMAL HIGH (ref 0.350–4.500)

## 2014-05-08 LAB — PROTIME-INR
INR: 3.42 — AB (ref 0.00–1.49)
Prothrombin Time: 34.5 seconds — ABNORMAL HIGH (ref 11.6–15.2)

## 2014-05-08 MED ORDER — WARFARIN SODIUM 1 MG PO TABS
1.0000 mg | ORAL_TABLET | Freq: Once | ORAL | Status: AC
  Administered 2014-05-08: 1 mg via ORAL
  Filled 2014-05-08: qty 1

## 2014-05-08 MED ORDER — ISOSORBIDE MONONITRATE ER 60 MG PO TB24
60.0000 mg | ORAL_TABLET | Freq: Every day | ORAL | Status: DC
  Administered 2014-05-08 – 2014-05-12 (×5): 60 mg via ORAL
  Filled 2014-05-08 (×5): qty 1

## 2014-05-08 MED ORDER — FUROSEMIDE 10 MG/ML IJ SOLN
40.0000 mg | Freq: Two times a day (BID) | INTRAMUSCULAR | Status: DC
  Administered 2014-05-08 – 2014-05-09 (×3): 40 mg via INTRAVENOUS
  Filled 2014-05-08 (×5): qty 4

## 2014-05-08 MED ORDER — POTASSIUM CHLORIDE CRYS ER 20 MEQ PO TBCR
20.0000 meq | EXTENDED_RELEASE_TABLET | Freq: Every day | ORAL | Status: DC
  Administered 2014-05-08 – 2014-05-11 (×4): 20 meq via ORAL
  Filled 2014-05-08 (×4): qty 1

## 2014-05-08 NOTE — Progress Notes (Signed)
  Echocardiogram 2D Echocardiogram has been performed.  [name] FRANCES 05/08/2014, 11:00 AM

## 2014-05-08 NOTE — Progress Notes (Signed)
[facility] PROGRESS NOTE Interim History: 79 y/o with HTN, HLD, CAD, atrial fibrillation and CHF. Came to ED with complaints of increase LE swelling SOB and weight gain of almost 20 pounds.  Filed Weights   05/07/14 1659 05/07/14 2101 05/08/14 0510  Weight: 96.163 kg (212 lb) 96.934 kg (213 lb 11.2 oz) 96.48 kg (212 lb 11.2 oz)        Intake/Output Summary (Last 24 hours) at 05/08/14 2235 Last data filed at 05/08/14 2048  Gross per 24 hour  Intake    703 ml  Output   1845 ml  Net  -1142 ml     Assessment/Plan: 1-Acute on chronic diastolic CHF (congestive heart failure): in presence of diet indiscretion. -continue IV lasix -daily weight -low sodium diet -close follow up of BMET -will check 2-D echo  2-CKD (chronic kidney disease): stage 4-5 at baseline -will follow closely with ongoing diuresis  3-Long term (current) use of anticoagulants: continue coumadin (dose per pharmacy).  4-SOB (shortness of breath): secondary to #1. Will follow clinical response  5-HLD (hyperlipidemia): continue statins  6-A-fib: -continue b-blocker and coumadin   7-CAD, NATIVE VESSEL: troponin negative. No CP currently. -continue current therapy  8-HYPERTENSION: stable and well controlled. Will continue current treatment.   Code Status: Full Family Communication: no family at bedside Disposition Plan: to be determine   Consultants:  None   Procedures: ECHO: pending  Antibiotics:  None   HPI/Subjective: No CP, feeling better and somewhat with improve breathing  Objective: Filed Vitals:   05/08/14 1133 05/08/14 1413 05/08/14 1922 05/08/14 2116  BP: 146/59 134/62  138/67  Pulse: 60 63  68  Temp:  97.8 F (36.6 C)  98 F (36.7 C)  TempSrc:  Oral  Oral  Resp:  18  20  Height:      Weight:      SpO2:  99% 95% 98%     Exam:  General: Alert, awake, oriented x3, in no acute distress; denies CP. Feeling better and breathing somewhat easier  HEENT: No bruits,  no goiter. Mild JVD Heart: Regular rate; positive SEM, no rubs or gallops Lungs: no wheezing; decrease BS at bases with fine crackles  Abdomen: Soft, nontender, nondistended, positive bowel sounds.  Neuro: Grossly intact, nonfocal.   Data Reviewed: Basic Metabolic Panel:  Recent Labs Lab 05/07/14 1659 05/08/14 0700  NA 138 137  K 4.4 4.4  CL 100 101  CO2 24 21  GLUCOSE 103* 88  BUN 51* 51*  CREATININE 3.26* 3.20*  CALCIUM 9.5 9.4   CBC:  Recent Labs Lab 05/07/14 1659  WBC 3.5*  HGB 13.4  HCT 41.0  MCV 90.5  PLT 113*   BNP (last 3 results)  Recent Labs  10/04/13 1416 10/28/13 1450 05/07/14 1659  PROBNP 1143.0* 1368.0* 11688.0*    Recent Results (from the past 240 hour(s))  MRSA PCR SCREENING     Status: None   Collection Time    05/07/14  8:49 PM      Result Value Ref Range Status   MRSA by PCR NEGATIVE  NEGATIVE Final   Comment:            The GeneXpert MRSA Assay (FDA     approved for NASAL specimens     only), is one component of a     comprehensive MRSA colonization     surveillance program. It is not     intended to diagnose MRSA     infection nor to guide  or     monitor treatment for     MRSA infections.     Studies: Dg Chest 2 View  05/07/2014   CLINICAL DATA:  One month history of cough and shortness of breath with onset of emesis postprandially over the past 2-3 weeks ; chronic renal insufficiency nearing dialysis  EXAM: CHEST  2 VIEW  COMPARISON:  Portable chest x-ray of September 26, 2013  FINDINGS: The right lung is mildly hypoinflated. A possibly secondary to chronically elevated hemidiaphragm. There is no alveolar infiltrate. The interstitial markings are slightly increased overall. The cardiac silhouette is enlarged. The pulmonary vascularity is prominent centrally. There is no significant pleural effusion. The permanent pacemaker electrodes are in appropriate position. The bony thorax exhibits no acute abnormalities.  IMPRESSION: The study  is limited due to hypoinflation of the right lung. This appears chronic and may be related to hemidiaphragm weakness or paralysis. Low-grade compensated CHF is suspected.   Electronically Signed   By: David  [place]   On: 05/07/2014 18:31    Scheduled Meds: . ALPRAZolam  0.5 mg Oral QHS  . aspirin  81 mg Oral Daily  . furosemide  40 mg Intravenous BID  . isosorbide mononitrate  60 mg Oral Daily  . magnesium oxide  400 mg Oral QHS  . metolazone  2.5 mg Oral BID  . metoprolol tartrate  75 mg Oral TID  . mometasone-formoterol  2 puff Inhalation BID  . pantoprazole  40 mg Oral BID  . potassium chloride  20 mEq Oral Daily  . simvastatin  5 mg Oral q1800  . sodium chloride  3 mL Intravenous Q12H  . Warfarin - Pharmacist Dosing Inpatient   Does not apply q1800   Continuous Infusions:   Time > 30 minutes   Barton Dubois  [facility] Pager [phone]. If 8PM-8AM, please contact night-coverage at [email or web address], password [facility] 05/08/2014, 10:35 PM  LOS: 1 day     **Disclaimer: This note may have been dictated with voice recognition software. Similar sounding words can inadvertently be transcribed and this note may contain transcription errors which may not have been corrected upon publication of note.**

## 2014-05-08 NOTE — Progress Notes (Signed)
ANTICOAGULATION CONSULT NOTE - Follow up Pharmacy Consult for warfarin  Indication: atrial fibrillation  Allergies  Allergen Reactions  . Amiodarone Hives  . Propylthiouracil Rash    Patient Measurements: Height: 5\' 7"  (170.2 cm) Weight: 212 lb 11.2 oz (96.48 kg) (Scale A) IBW/kg (Calculated) : 61.6   Vital Signs: Temp: 97.8 F (36.6 C) (09/24 1413) Temp src: Oral (09/24 1413) BP: 134/62 mmHg (09/24 1413) Pulse Rate: 63 (09/24 1413)  Labs:  Recent Labs  05/07/14 1659 05/08/14 0700 05/08/14 0940  HGB 13.4  --   --   HCT 41.0  --   --   PLT 113*  --   --   LABPROT 32.9*  --  34.5*  INR 3.22*  --  3.42*  CREATININE 3.26* 3.20*  --     Estimated Creatinine Clearance: 17.6 ml/min (by C-G formula based on Cr of 3.2).   Medical History: Past Medical History  Diagnosis Date  . Cellulitis and abscess of unspecified site     a. h/o MSRA cellulitis of abdominal wall.  Marland Kitchen CAD (coronary artery disease)     a. DES to OM 01/2005. b. Rotational atherotomy/DES to prox LAD 02/2008. c. Cath 07/2013: stable, for med rx.  . Edema   . Warfarin anticoagulation     a. Followed by PCP.  Marland Kitchen GI bleed     a. EGD 01/2007: antral ulcers related to NSAID/aspirin use.  . Peripheral vascular disease   . Hyperthyroidism     a. Hx of intolerance to PTU.  Marland Kitchen Blood transfusion years ago    "heart problems" (09/26/2013)  . H/O hiatal hernia   . Umbilical hernia     unrepaired (11/24/11)  . Valvular heart disease     Echo 11/2011: mild-mod MR.  Marland Kitchen Hypertension     takes Amlodipine,Hydralazine,and Imdur daily  . Hyperlipidemia     takes Lovastatin daily  . SSS (sick sinus syndrome)     a. H/o AV node ablation 2008 secondary to difficult to control afib/flutter, with subsequent pacer.  . Atrial fibrillation with rapid ventricular response     a. H/o amiodarone thyroiditis. b. s/p AV node ablation 2008 with implantation of PPM 01/2007, upgraded to CRT-P 06/2007.;takes Coumadin daily  . GERD  (gastroesophageal reflux disease)     takes Protonix daily  . Chronic diastolic CHF (congestive heart failure)     takes Lasix daily and Spironolactone   . Pacemaker     Medtronic  . History of gout   . Bruises easily   . Hemorrhoids   . Depression     takes Xanax daily  . Insomnia     takes Xanax if needed  . Heart murmur   . Chronic renal insufficiency     Cr ~2.3.  . Polycystic kidney disease     a. Per remote E-Chart records.  . Kidney stones   . Myocardial infarction     "I've had 2-3; last one was in /1996" (09/26/2013)  . Exertional shortness of breath   . Migraines     "last one was a pretty good while ago" (09/26/2013)  . Arthritis     "all over" (09/26/2013)  . Chronic lower back pain     deteriorating disc  . Anxiety   . Iron deficiency anemia     takes an Iron Pill daily  . Pneumonia    Assessment: 79 year old female admitted 05/07/14 for acute diastolic CHF  with history of afib on coumadin. INR increased to  3.4 today from 3.2 on admit last night. No bleeding issues noted. Coumadin dose held last night.   Warfarin dose pta: 2mg  daily except 3mg  on Monday/Fridays  Acute on chronic kidney injury: SCr 3.42 today, up from 3.22 on admit.  Goal of Therapy:  INR 2-3 Monitor platelets by anticoagulation protocol: Yes   Plan:  Give coumadin 1mg  tonight. Daily INR  Nicole Cella, RPh Clinical Pharmacist Pager: [phone] 05/08/2014 3:31 PM

## 2014-05-08 NOTE — Progress Notes (Signed)
UR completed Steffie Waggoner K. Cayli Escajeda, RN, BSN, [place], CCM  05/08/2014 10:27 AM

## 2014-05-08 NOTE — Care Management Note (Addendum)
  Page 2 of 2   05/12/2014     2:40:23 PM CARE MANAGEMENT NOTE 05/12/2014  Patient:  [name], [name]   Account Number:  [id number]  Date Initiated:  05/08/2014  Documentation initiated by:  Mariann Laster  Subjective/Objective Assessment:   Emesis, Body Aches, acute on chronic diastolic HF,     Action/Plan:   CM to follow for disposition needs   Anticipated DC Date:  05/11/2014   Anticipated DC Plan:  [place]  CM consult      [place]   Choice offered to / List presented to:  C-1 Patient   DME arranged  3-N-1      DME agency  [place] arranged  HH-1 RN  [place] OT      [place].   Status of service:  Completed, signed off Medicare Important Message given?  YES (If response is "NO", the following Medicare IM given date fields will be blank) Date Medicare IM given:  05/09/2014 Medicare IM given by:  Jlyn Cerros Date Additional Medicare IM given:  05/12/2014 Additional Medicare IM given by:  Jaedan Schuman  Discharge Disposition:  [place]  Per UR Regulation:  Reviewed for med. necessity/level of care/duration of stay  If discussed at [place] of Stay Meetings, dates discussed:   05/13/2014    Comments:  Mariann Laster RN, BSN, MSHL, CCM  Nurse - Case Manager,  ([facility])  [phone]  05/09/2014 Social:  From home with son.  Ambulates via walker. Acute on Chronic HF, AV Fistula in place but currently does not need. IV Lasix PT/OT RECS:  HH PT/OT and 3n1 Dispo Plan:  Home / HHS:  RN, PT, OT [facility] / Debbie notified); DME 3n1 ([facility] notified)

## 2014-05-09 ENCOUNTER — Encounter: Payer: Medicare Other | Admitting: Cardiovascular Disease

## 2014-05-09 LAB — PROTIME-INR
INR: 3.06 — AB (ref 0.00–1.49)
PROTHROMBIN TIME: 31.6 s — AB (ref 11.6–15.2)

## 2014-05-09 LAB — BASIC METABOLIC PANEL
ANION GAP: 19 — AB (ref 5–15)
BUN: 55 mg/dL — ABNORMAL HIGH (ref 6–23)
CALCIUM: 9.1 mg/dL (ref 8.4–10.5)
CO2: 23 mEq/L (ref 19–32)
Chloride: 101 mEq/L (ref 96–112)
Creatinine, Ser: 3.32 mg/dL — ABNORMAL HIGH (ref 0.50–1.10)
GFR calc Af Amer: 14 mL/min — ABNORMAL LOW (ref >90)
GFR calc non Af Amer: 12 mL/min — ABNORMAL LOW (ref >90)
Glucose, Bld: 81 mg/dL (ref 70–99)
Potassium: 3.8 mEq/L (ref 3.7–5.3)
SODIUM: 143 meq/L (ref 137–147)

## 2014-05-09 MED ORDER — SALINE SPRAY 0.65 % NA SOLN
1.0000 | NASAL | Status: DC | PRN
  Filled 2014-05-09: qty 44

## 2014-05-09 MED ORDER — LORATADINE 10 MG PO TABS
10.0000 mg | ORAL_TABLET | Freq: Every day | ORAL | Status: DC
  Administered 2014-05-09 – 2014-05-12 (×4): 10 mg via ORAL
  Filled 2014-05-09 (×4): qty 1

## 2014-05-09 MED ORDER — PHENOL 1.4 % MT LIQD
1.0000 | OROMUCOSAL | Status: DC | PRN
  Filled 2014-05-09: qty 177

## 2014-05-09 MED ORDER — FUROSEMIDE 10 MG/ML IJ SOLN
40.0000 mg | Freq: Three times a day (TID) | INTRAMUSCULAR | Status: DC
  Administered 2014-05-09 – 2014-05-11 (×6): 40 mg via INTRAVENOUS
  Filled 2014-05-09 (×7): qty 4

## 2014-05-09 MED ORDER — WARFARIN SODIUM 2 MG PO TABS
2.0000 mg | ORAL_TABLET | Freq: Once | ORAL | Status: AC
  Administered 2014-05-09: 2 mg via ORAL
  Filled 2014-05-09: qty 1

## 2014-05-09 NOTE — Progress Notes (Signed)
[facility] PROGRESS NOTE Interim History: 79 y/o with HTN, HLD, CAD, atrial fibrillation and CHF. Came to ED with complaints of increase LE swelling SOB and weight gain of almost 20 pounds.  Filed Weights   05/07/14 2101 05/08/14 0510 05/09/14 0511  Weight: 96.934 kg (213 lb 11.2 oz) 96.48 kg (212 lb 11.2 oz) 96.072 kg (211 lb 12.8 oz)        Intake/Output Summary (Last 24 hours) at 05/09/14 1314 Last data filed at 05/09/14 1001  Gross per 24 hour  Intake    460 ml  Output   1800 ml  Net  -1340 ml     Assessment/Plan: 1-Acute on chronic diastolic CHF (congestive heart failure): in presence of diet indiscretion. -continue IV lasix (will increase to TID) and also metolazone  -daily weight -continue low sodium diet -close follow up of BMET -2-D echo with preserved EF, no wall motion abnormalities.  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  4-SOB (shortness of breath): secondary to #1. Improving. Treatment as mentioned above.  5-HLD (hyperlipidemia): continue statins  6-A-fib: -continue b-blocker and coumadin   7-CAD, NATIVE VESSEL: troponin negative. No CP currently. -continue current therapy  8-HYPERTENSION: stable and well controlled. Will continue current treatment.  9-pulmonary HTN: continue imdur and lasix.  10-rhinitis: will start claritin and saline nasal spray. PRN chloraseptic spray  Code Status: Full Family Communication: no family at bedside Disposition Plan: to be determine   Consultants:  None   Procedures: ECHO:  - Left ventricle: The cavity size was normal. Systolic function was normal. The estimated ejection fraction was in the range of 55% to 60%. Wall motion was normal; there were no regional wall motion abnormalities. - Aortic valve: Trileaflet; normal thickness,  mildly calcified leaflets. There was trivial regurgitation. - Mitral valve: There was moderate regurgitation. - Left atrium: The atrium was severely dilated. - Right ventricle: The cavity size was severely dilated. Wall thickness was normal. - Right atrium: The atrium was severely dilated. - Tricuspid valve: There was moderate-severe regurgitation. - Pulmonic valve: There was trivial regurgitation. - Pulmonary arteries: PA peak pressure: 44 mm Hg (S).  Antibiotics:  None   HPI/Subjective: No CP, feeling better and with improve breathing; complaining of sore throat, nasal congestion and dry cough.  Objective: Filed Vitals:   05/08/14 2116 05/09/14 0511 05/09/14 0900 05/09/14 1008  BP: 138/67 131/61  126/45  Pulse: 68 65  59  Temp: 98 F (36.7 C) 97.3 F (36.3 C)  97.4 F (36.3 C)  TempSrc: Oral Oral  Oral  Resp: 20 20  20   Height:      Weight:  96.072 kg (211 lb 12.8 oz)    SpO2: 98% 95% 100% 97%     Exam:  General: Alert, awake, oriented x3, in no acute distress; denies CP. Feeling better and breathing better. Complaining of some dry cough, nasal congestion and sore throat  HEENT: No bruits, no goiter. No JVD Heart: Regular rate; positive SEM, no rubs or gallops Lungs: no wheezing; decrease BS at bases with fine crackles  Abdomen: Soft, nontender, nondistended, positive bowel sounds. There is increased girth on abd wall on exam (but improved from admission)  Neuro: Grossly intact, nonfocal.   Data Reviewed: Basic Metabolic Panel:  Recent Labs Lab 05/07/14 1659 05/08/14 0700 05/09/14 0425  NA 138 137 143  K 4.4  4.4 3.8  CL 100 101 101  CO2 24 21 23   GLUCOSE 103* 88 81  BUN 51* 51* 55*  CREATININE 3.26* 3.20* 3.32*  CALCIUM 9.5 9.4 9.1   CBC:  Recent Labs Lab 05/07/14 1659  WBC 3.5*  HGB 13.4  HCT 41.0  MCV 90.5  PLT 113*   BNP (last 3 results)  Recent Labs  10/04/13 1416 10/28/13 1450 05/07/14 1659  PROBNP 1143.0* 1368.0* 11688.0*     Recent Results (from the past 240 hour(s))  MRSA PCR SCREENING     Status: None   Collection Time    05/07/14  8:49 PM      Result Value Ref Range Status   MRSA by PCR NEGATIVE  NEGATIVE Final   Comment:            The GeneXpert MRSA Assay (FDA     approved for NASAL specimens     only), is one component of a     comprehensive MRSA colonization     surveillance program. It is not     intended to diagnose MRSA     infection nor to guide or     monitor treatment for     MRSA infections.     Studies: Dg Chest 2 View  05/07/2014   CLINICAL DATA:  One month history of cough and shortness of breath with onset of emesis postprandially over the past 2-3 weeks ; chronic renal insufficiency nearing dialysis  EXAM: CHEST  2 VIEW  COMPARISON:  Portable chest x-ray of September 26, 2013  FINDINGS: The right lung is mildly hypoinflated. A possibly secondary to chronically elevated hemidiaphragm. There is no alveolar infiltrate. The interstitial markings are slightly increased overall. The cardiac silhouette is enlarged. The pulmonary vascularity is prominent centrally. There is no significant pleural effusion. The permanent pacemaker electrodes are in appropriate position. The bony thorax exhibits no acute abnormalities.  IMPRESSION: The study is limited due to hypoinflation of the right lung. This appears chronic and may be related to hemidiaphragm weakness or paralysis. Low-grade compensated CHF is suspected.   Electronically Signed   By: David  Martinique   On: 05/07/2014 18:31    Scheduled Meds: . ALPRAZolam  0.5 mg Oral QHS  . aspirin  81 mg Oral Daily  . furosemide  40 mg Intravenous BID  . isosorbide mononitrate  60 mg Oral Daily  . loratadine  10 mg Oral Daily  . magnesium oxide  400 mg Oral QHS  . metolazone  2.5 mg Oral BID  . metoprolol tartrate  75 mg Oral TID  . mometasone-formoterol  2 puff Inhalation BID  . pantoprazole  40 mg Oral BID  . potassium chloride  20 mEq Oral Daily  .  simvastatin  5 mg Oral q1800  . sodium chloride  3 mL Intravenous Q12H  . Warfarin - Pharmacist Dosing Inpatient   Does not apply q1800   Continuous Infusions:   Time > 30 minutes   Barton Dubois  [facility] Pager [phone]. If 8PM-8AM, please contact night-coverage at [email or web address], password [facility] 05/09/2014, 1:14 PM  LOS: 2 days     **Disclaimer: This note may have been dictated with voice recognition software. Similar sounding words can inadvertently be transcribed and this note may contain transcription errors which may not have been corrected upon publication of note.**

## 2014-05-09 NOTE — Progress Notes (Signed)
Patient ID: [name], female   DOB: 04-Jan-1935, 79 y.o.   MRN: [id number] [name] is a 79 y.o. female with a hx of CAD, s/p DES to the OM in 6/06, rotational atherectomy + DES to pLAD in 7/09, symptomatic bradycardia s/p pacemaker with upgrade to CRT-P 11/08, AFib s/p AVN ablation (prior amiodarone thyroiditis), chronic diastolic CHF, mitral regurgitation, CKD stage IV, HTN, HL, GERD, prior UGI bleed in 2008 related to NSAID use, PVD. She has had a difficult time with access for hemodialysis. She had a brachial vein transposition in 09/2013.  She was seen in 06/2013 with worsening chest pain consistent with CCS class III angina. She was ultimately set up for cardiac catheterization that demonstrated patent stent in the LAD and obtuse marginal 1 with nonobstructive disease elsewhere. Medical therapy was continued.  She was admitted in 07/2013 with progressive dysphagia. EGD demonstrated large prepyloric ulcer and polyp which was removed. She had esophageal dilatation.   Studies:  - Myoview (01/2009): EF 79%, no scar or ischemia.  - Echo (03/2013): Mild focal basal septal hypertrophy, EF 55-60%, mild MR, moderate to severe LAE, mild to moderate RVE, reduced RVSF, moderate RAE.  - LHC (07/14/13): pLAD stent ok, LAD 30-40, ostial first septal perf 80, OM1 stent ok with 20 ISR, AVCFX 20-30, prox to mid RCA 30-40. Med Rx.       ROS: Denies fever, malais, weight loss, blurry vision, decreased visual acuity, cough, sputum, SOB, hemoptysis, pleuritic pain, palpitaitons, heartburn, abdominal pain, melena, lower extremity edema, claudication, or rash.  All other systems reviewed and negative  General: Affect appropriate Healthy:  appears stated age 26: normal Neck supple with no adenopathy JVP normal no bruits no thyromegaly Lungs clear with no wheezing and good diaphragmatic motion Heart:  S1/S2 no murmur, no rub, gallop or click PMI normal Abdomen: benighn, BS positve, no tenderness,  no AAA no bruit.  No HSM or HJR Distal pulses intact with no bruits No edema Neuro non-focal Skin warm and dry No muscular weakness   No current facility-administered medications for this visit.   Current Outpatient Prescriptions  Medication Sig Dispense Refill  . [DISCONTINUED] FLUoxetine (PROZAC) 20 MG capsule Take 20 mg by mouth daily.         Facility-Administered Medications Ordered in Other Visits  Medication Dose Route Frequency Provider Last Rate Last Dose  . 0.9 %  sodium chloride infusion  250 mL Intravenous PRN Theressa Millard, MD      . acetaminophen (TYLENOL) tablet 650 mg  650 mg Oral Q4H PRN Theressa Millard, MD   650 mg at 05/08/14 2041  . ALPRAZolam Duanne Moron) tablet 0.5 mg  0.5 mg Oral QHS Theressa Millard, MD   0.5 mg at 05/08/14 2317  . aspirin chewable tablet 81 mg  81 mg Oral Daily Theressa Millard, MD   81 mg at 05/09/14 1025  . furosemide (LASIX) injection 40 mg  40 mg Intravenous BID Barton Dubois, MD   40 mg at 05/09/14 0751  . isosorbide mononitrate (IMDUR) 24 hr tablet 60 mg  60 mg Oral Daily Barton Dubois, MD   60 mg at 05/09/14 1024  . magnesium oxide (MAG-OX) tablet 400 mg  400 mg Oral QHS Theressa Millard, MD   400 mg at 05/08/14 2317  . metolazone (ZAROXOLYN) tablet 2.5 mg  2.5 mg Oral BID Theressa Millard, MD   2.5 mg at 05/09/14 1024  . metoprolol tartrate (LOPRESSOR) tablet 75 mg  75 mg Oral TID Theressa Millard, MD   75 mg at 05/09/14 1024  . mometasone-formoterol (DULERA) 100-5 MCG/ACT inhaler 2 puff  2 puff Inhalation BID Theressa Millard, MD   2 puff at 05/09/14 0914  . nitroGLYCERIN (NITROSTAT) SL tablet 0.4 mg  0.4 mg Sublingual Q5 min PRN Theressa Millard, MD      . ondansetron [facility]) injection 4 mg  4 mg Intravenous Q6H PRN Theressa Millard, MD   4 mg at 05/08/14 1945  . pantoprazole (PROTONIX) EC tablet 40 mg  40 mg Oral BID Theressa Millard, MD   40 mg at 05/09/14 1024  . potassium chloride SA (K-DUR,KLOR-CON) CR tablet  20 mEq  20 mEq Oral Daily Barton Dubois, MD   20 mEq at 05/09/14 1024  . simvastatin (ZOCOR) tablet 5 mg  5 mg Oral q1800 Theressa Millard, MD   5 mg at 05/08/14 1757  . sodium chloride 0.9 % injection 3 mL  3 mL Intravenous Q12H Theressa Millard, MD   3 mL at 05/08/14 2318  . sodium chloride 0.9 % injection 3 mL  3 mL Intravenous PRN Theressa Millard, MD   3 mL at 05/09/14 0752  . Warfarin - Pharmacist Dosing Inpatient   Does not apply q1800 Georgina Peer, [facility]        Allergies  Amiodarone and Propylthiouracil  Electrocardiogram:  V paced low voltage   Assessment and Plan

## 2014-05-09 NOTE — Progress Notes (Signed)
ANTICOAGULATION CONSULT NOTE - Follow up Pharmacy Consult for warfarin  Indication: atrial fibrillation  Allergies  Allergen Reactions  . Amiodarone Hives  . Propylthiouracil Rash    Patient Measurements: Height: 5\' 7"  (170.2 cm) Weight: 211 lb 12.8 oz (96.072 kg) (Scale A) IBW/kg (Calculated) : 61.6   Vital Signs: Temp: 97.4 F (36.3 C) (09/25 1008) Temp src: Oral (09/25 1008) BP: 126/45 mmHg (09/25 1008) Pulse Rate: 59 (09/25 1008)  Labs:  Recent Labs  05/07/14 1659 05/08/14 0700 05/08/14 0940 05/09/14 0425  HGB 13.4  --   --   --   HCT 41.0  --   --   --   PLT 113*  --   --   --   LABPROT 32.9*  --  34.5* 31.6*  INR 3.22*  --  3.42* 3.06*  CREATININE 3.26* 3.20*  --  3.32*    Estimated Creatinine Clearance: 16.9 ml/min (by C-G formula based on Cr of 3.32).   Assessment: 79 year old female admitted 05/07/14 for acute diastolic CHF with history of afib on coumadin. INR 3.06 today. No bleeding issues noted. Coumadin dose held 9/23.   Warfarin dose pta: 2mg  daily except 3mg  on Monday/Fridays  Acute on chronic kidney injury: SCr 3.32 today   Goal of Therapy:  INR 2-3 Monitor platelets by anticoagulation protocol: Yes   Plan:  Give coumadin 2mg  tonight. Daily INR  Eudelia Bunch, Pharm.D. [phone] 05/09/2014 2:14 PM

## 2014-05-10 LAB — BASIC METABOLIC PANEL
ANION GAP: 20 — AB (ref 5–15)
BUN: 59 mg/dL — AB (ref 6–23)
CO2: 23 mEq/L (ref 19–32)
CREATININE: 3.47 mg/dL — AB (ref 0.50–1.10)
Calcium: 9.3 mg/dL (ref 8.4–10.5)
Chloride: 98 mEq/L (ref 96–112)
GFR, EST AFRICAN AMERICAN: 14 mL/min — AB (ref >90)
GFR, EST NON AFRICAN AMERICAN: 12 mL/min — AB (ref >90)
Glucose, Bld: 92 mg/dL (ref 70–99)
POTASSIUM: 3.3 meq/L — AB (ref 3.7–5.3)
Sodium: 141 mEq/L (ref 137–147)

## 2014-05-10 LAB — HEMOGLOBIN AND HEMATOCRIT, BLOOD
HCT: 39.4 % (ref 36.0–46.0)
Hemoglobin: 13 g/dL (ref 12.0–15.0)

## 2014-05-10 LAB — PROTIME-INR
INR: 3.12 — AB (ref 0.00–1.49)
Prothrombin Time: 32.1 seconds — ABNORMAL HIGH (ref 11.6–15.2)

## 2014-05-10 LAB — PRO B NATRIURETIC PEPTIDE: PRO B NATRI PEPTIDE: 12366 pg/mL — AB (ref 0–450)

## 2014-05-10 MED ORDER — WARFARIN SODIUM 1 MG PO TABS
1.0000 mg | ORAL_TABLET | Freq: Once | ORAL | Status: AC
  Administered 2014-05-10: 1 mg via ORAL
  Filled 2014-05-10: qty 1

## 2014-05-10 MED ORDER — "THROMBI-PAD 3""X3"" EX PADS"
1.0000 | MEDICATED_PAD | Freq: Once | CUTANEOUS | Status: AC
  Administered 2014-05-10: 1 via TOPICAL
  Filled 2014-05-10: qty 1

## 2014-05-10 MED ORDER — CAMPHOR-MENTHOL 0.5-0.5 % EX LOTN
TOPICAL_LOTION | CUTANEOUS | Status: DC | PRN
  Administered 2014-05-10: 1 via TOPICAL
  Administered 2014-05-11: 11:00:00 via TOPICAL
  Filled 2014-05-10: qty 222

## 2014-05-10 NOTE — Progress Notes (Signed)
[facility] PROGRESS NOTE Interim History: 79 y/o with HTN, HLD, CAD, atrial fibrillation and CHF. Came to ED with complaints of increase LE swelling SOB and weight gain of almost 20 pounds.  Filed Weights   05/08/14 0510 05/09/14 0511 05/10/14 0354  Weight: 96.48 kg (212 lb 11.2 oz) 96.072 kg (211 lb 12.8 oz) 95 kg (209 lb 7 oz)        Intake/Output Summary (Last 24 hours) at 05/10/14 1512 Last data filed at 05/10/14 1415  Gross per 24 hour  Intake     53 ml  Output   1751 ml  Net  -1698 ml     Assessment/Plan: 1-Acute on chronic diastolic CHF (congestive heart failure): in presence of diet indiscretion. -continue IV lasix (TID) and also metolazone  -daily weight -continue low sodium diet -close follow up of BMET -2-D echo with preserved EF, no wall motion abnormalities.  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  4-SOB (shortness of breath): secondary to #1. Improving. Treatment as mentioned above.  5-HLD (hyperlipidemia): continue statins  6-A-fib: -continue b-blocker and coumadin per pharmacy (INr therapeutic)   7-CAD, NATIVE VESSEL: troponin negative. No CP currently. -continue current therapy  8-HYPERTENSION: stable and well controlled. Will continue current treatment.  9-pulmonary HTN: continue imdur and lasix.  10-rhinitis: will continue claritin and saline nasal spray. PRN chloraseptic spray  11-skin lesion and bleeding: bleeding due to coumadin -start sarna lotion to control itching and prevent further skin damage -will apply thrombipad   Code Status: Full Family Communication: no family at bedside Disposition Plan: to be determine   Consultants:  None   Procedures: ECHO:  - Left ventricle: The cavity size was normal. Systolic function was normal. The  estimated ejection fraction was in the range of 55% to 60%. Wall motion was normal; there were no regional wall motion abnormalities. - Aortic valve: Trileaflet; normal thickness, mildly calcified leaflets. There was trivial regurgitation. - Mitral valve: There was moderate regurgitation. - Left atrium: The atrium was severely dilated. - Right ventricle: The cavity size was severely dilated. Wall thickness was normal. - Right atrium: The atrium was severely dilated. - Tricuspid valve: There was moderate-severe regurgitation. - Pulmonic valve: There was trivial regurgitation. - Pulmonary arteries: PA peak pressure: 44 mm Hg (S).  Antibiotics:  None   HPI/Subjective: No CP, feeling better and with improved breathing. Patient has scratch and removed on of old scabs on her back (with significant bleeding from created wound)  Objective: Filed Vitals:   05/10/14 0354 05/10/14 0750 05/10/14 1045 05/10/14 1501  BP: 150/70  142/62 135/59  Pulse: 61  59 59  Temp: 97.4 F (36.3 C)   97.6 F (36.4 C)  TempSrc: Oral   Oral  Resp: 22  18 18   Height:      Weight: 95 kg (209 lb 7 oz)     SpO2: 100% 96% 98% 99%     Exam:  General: Alert, awake, oriented x3, in no acute distress; denies CP. Feeling better and breathing better. Complaining of some dry cough, nasal congestion and sore throat  HEENT: No bruits, no goiter. No JVD Heart: Regular rate; positive SEM, no rubs or gallops Lungs: no wheezing; decrease BS at bases with fine crackles  Abdomen: Soft, nontender, nondistended, positive bowel sounds. There is increased girth on abd wall on exam (  but improved from admission)  Neuro: Grossly intact, nonfocal.   Data Reviewed: Basic Metabolic Panel:  Recent Labs Lab 05/07/14 1659 05/08/14 0700 05/09/14 0425 05/10/14 0503  NA 138 137 143 141  K 4.4 4.4 3.8 3.3*  CL 100 101 101 98  CO2 24 21 23 23   GLUCOSE 103* 88 81 92  BUN 51* 51* 55* 59*  CREATININE 3.26* 3.20* 3.32* 3.47*   CALCIUM 9.5 9.4 9.1 9.3   CBC:  Recent Labs Lab 05/07/14 1659  WBC 3.5*  HGB 13.4  HCT 41.0  MCV 90.5  PLT 113*   BNP (last 3 results)  Recent Labs  10/28/13 1450 05/07/14 1659 05/10/14 0508  PROBNP 1368.0* 11688.0* 12366.0*    Recent Results (from the past 240 hour(s))  MRSA PCR SCREENING     Status: None   Collection Time    05/07/14  8:49 PM      Result Value Ref Range Status   MRSA by PCR NEGATIVE  NEGATIVE Final   Comment:            The GeneXpert MRSA Assay (FDA     approved for NASAL specimens     only), is one component of a     comprehensive MRSA colonization     surveillance program. It is not     intended to diagnose MRSA     infection nor to guide or     monitor treatment for     MRSA infections.     Studies: No results found.  Scheduled Meds: . ALPRAZolam  0.5 mg Oral QHS  . aspirin  81 mg Oral Daily  . furosemide  40 mg Intravenous TID  . isosorbide mononitrate  60 mg Oral Daily  . loratadine  10 mg Oral Daily  . magnesium oxide  400 mg Oral QHS  . metolazone  2.5 mg Oral BID  . metoprolol tartrate  75 mg Oral TID  . mometasone-formoterol  2 puff Inhalation BID  . pantoprazole  40 mg Oral BID  . potassium chloride  20 mEq Oral Daily  . simvastatin  5 mg Oral q1800  . sodium chloride  3 mL Intravenous Q12H  . THROMBI-PAD  1 each Topical Once  . warfarin  1 mg Oral ONCE-1800  . Warfarin - Pharmacist Dosing Inpatient   Does not apply q1800   Continuous Infusions:   Time > 30 minutes   Barton Dubois  [facility] Pager [phone]. If 8PM-8AM, please contact night-coverage at [email or web address], password [facility] 05/10/2014, 3:12 PM  LOS: 3 days

## 2014-05-10 NOTE — Progress Notes (Signed)
ANTICOAGULATION CONSULT NOTE - Follow Up Consult  Pharmacy Consult for coumadin Indication: atrial fibrillation  Allergies  Allergen Reactions  . Amiodarone Hives  . Propylthiouracil Rash    Patient Measurements: Height: 5\' 7"  (170.2 cm) Weight: 209 lb 7 oz (95 kg) IBW/kg (Calculated) : 61.6 Heparin Dosing Weight:   Vital Signs: Temp: 97.4 F (36.3 C) (09/26 0354) Temp src: Oral (09/26 0354) BP: 150/70 mmHg (09/26 0354) Pulse Rate: 61 (09/26 0354)  Labs:  Recent Labs  05/07/14 1659 05/08/14 0700 05/08/14 0940 05/09/14 0425 05/10/14 0503  HGB 13.4  --   --   --   --   HCT 41.0  --   --   --   --   PLT 113*  --   --   --   --   LABPROT 32.9*  --  34.5* 31.6* 32.1*  INR 3.22*  --  3.42* 3.06* 3.12*  CREATININE 3.26* 3.20*  --  3.32* 3.47*    Estimated Creatinine Clearance: 16.1 ml/min (by C-G formula based on Cr of 3.47).   Medications:  Scheduled:  . ALPRAZolam  0.5 mg Oral QHS  . aspirin  81 mg Oral Daily  . furosemide  40 mg Intravenous TID  . isosorbide mononitrate  60 mg Oral Daily  . loratadine  10 mg Oral Daily  . magnesium oxide  400 mg Oral QHS  . metolazone  2.5 mg Oral BID  . metoprolol tartrate  75 mg Oral TID  . mometasone-formoterol  2 puff Inhalation BID  . pantoprazole  40 mg Oral BID  . potassium chloride  20 mEq Oral Daily  . simvastatin  5 mg Oral q1800  . sodium chloride  3 mL Intravenous Q12H  . Warfarin - Pharmacist Dosing Inpatient   Does not apply q1800   Infusions:    Assessment: 79 yo female with afib is currently on supratherapeutic coumadin.  INR is up a little to 3.12. Goal of Therapy:  INR 2-3 Monitor platelets by anticoagulation protocol: Yes   Plan:  - coumadin 1 mg po x1 - INR in am  Melvie Paglia, [facility] 05/10/2014,10:10 AM

## 2014-05-11 LAB — PROTIME-INR
INR: 3.38 — AB (ref 0.00–1.49)
Prothrombin Time: 34.2 seconds — ABNORMAL HIGH (ref 11.6–15.2)

## 2014-05-11 LAB — BASIC METABOLIC PANEL
Anion gap: 19 — ABNORMAL HIGH (ref 5–15)
BUN: 62 mg/dL — ABNORMAL HIGH (ref 6–23)
CHLORIDE: 97 meq/L (ref 96–112)
CO2: 27 meq/L (ref 19–32)
CREATININE: 3.56 mg/dL — AB (ref 0.50–1.10)
Calcium: 9.7 mg/dL (ref 8.4–10.5)
GFR calc Af Amer: 13 mL/min — ABNORMAL LOW (ref >90)
GFR calc non Af Amer: 11 mL/min — ABNORMAL LOW (ref >90)
Glucose, Bld: 100 mg/dL — ABNORMAL HIGH (ref 70–99)
POTASSIUM: 3.2 meq/L — AB (ref 3.7–5.3)
Sodium: 143 mEq/L (ref 137–147)

## 2014-05-11 MED ORDER — OXYCODONE-ACETAMINOPHEN 5-325 MG PO TABS: 1.0000 | ORAL_TABLET | Freq: Three times a day (TID) | ORAL | Status: DC | PRN

## 2014-05-11 MED ORDER — POTASSIUM CHLORIDE CRYS ER 20 MEQ PO TBCR
40.0000 meq | EXTENDED_RELEASE_TABLET | Freq: Every day | ORAL | Status: DC
Start: 2014-05-12 — End: 2014-05-12
  Administered 2014-05-12: 40 meq via ORAL
  Filled 2014-05-11: qty 2

## 2014-05-11 MED ORDER — WARFARIN 0.5 MG HALF TABLET
0.5000 mg | ORAL_TABLET | Freq: Once | ORAL | Status: AC
  Administered 2014-05-11: 0.5 mg via ORAL
  Filled 2014-05-11: qty 1

## 2014-05-11 MED ORDER — FUROSEMIDE 10 MG/ML IJ SOLN
40.0000 mg | Freq: Two times a day (BID) | INTRAMUSCULAR | Status: DC
  Administered 2014-05-11 – 2014-05-12 (×2): 40 mg via INTRAVENOUS
  Filled 2014-05-11 (×2): qty 4

## 2014-05-11 NOTE — Progress Notes (Signed)
Pt alert and oriented to self, disoriented to time, place and situation. MD made aware as pt was A&O x3/4 prior to this shift per documentation.  No additional orders at this time.  Will continue to monitor.

## 2014-05-11 NOTE — Progress Notes (Signed)
ANTICOAGULATION CONSULT NOTE - Follow Up Consult  Pharmacy Consult for coumadin Indication: atrial fibrillation  Allergies  Allergen Reactions  . Amiodarone Hives  . Propylthiouracil Rash    Patient Measurements: Height: 5\' 7"  (170.2 cm) Weight: 203 lb 4.2 oz (92.2 kg) IBW/kg (Calculated) : 61.6 Heparin Dosing Weight:   Vital Signs: Temp: 97.4 F (36.3 C) (09/27 0500) Temp src: Oral (09/27 0500) BP: 140/65 mmHg (09/27 0500) Pulse Rate: 60 (09/27 0500)  Labs:  Recent Labs  05/09/14 0425 05/10/14 0503 05/10/14 1555 05/11/14 0413 05/11/14 0833  HGB  --   --  13.0  --   --   HCT  --   --  39.4  --   --   LABPROT 31.6* 32.1*  --  34.2*  --   INR 3.06* 3.12*  --  3.38*  --   CREATININE 3.32* 3.47*  --   --  3.56*    Estimated Creatinine Clearance: 15.4 ml/min (by C-G formula based on Cr of 3.56).   Medications:  Scheduled:  . ALPRAZolam  0.5 mg Oral QHS  . aspirin  81 mg Oral Daily  . furosemide  40 mg Intravenous TID  . isosorbide mononitrate  60 mg Oral Daily  . loratadine  10 mg Oral Daily  . magnesium oxide  400 mg Oral QHS  . metolazone  2.5 mg Oral BID  . metoprolol tartrate  75 mg Oral TID  . mometasone-formoterol  2 puff Inhalation BID  . pantoprazole  40 mg Oral BID  . potassium chloride  20 mEq Oral Daily  . simvastatin  5 mg Oral q1800  . sodium chloride  3 mL Intravenous Q12H  . Warfarin - Pharmacist Dosing Inpatient   Does not apply q1800   Infusions:    Assessment: 79 yo female with hx of afib is currently on supratherapeutic coumadin. INR today up to 3.38. Goal of Therapy:  INR 2-3 Monitor platelets by anticoagulation protocol: Yes   Plan:  - coumadin 0.5 mg po x1 - INR in am  Jaxsyn Catalfamo, [facility] 05/11/2014,10:06 AM

## 2014-05-11 NOTE — Evaluation (Signed)
Physical Therapy Evaluation Patient Details Name: [name] MRN: [id number] DOB: [birth year] Today's Date: 05/11/2014   History of Present Illness  79 y/o with HTN, HLD, CAD, atrial fibrillation and CHF. Came to ED with complaints of increase LE swelling SOB and weight gain of almost 20 pounds. -Acute on chronic diastolic CHF (congestive heart failure): in presence of diet indiscretion.  Clinical Impression   Pt admitted with above. Pt currently with functional limitations due to the deficits listed below (see PT Problem List).  Pt will benefit from skilled PT to increase their independence and safety with mobility to allow discharge to the venue listed below. On eval, pt showing behaviors consistent with delirium; As delirium resolves, it is likely pt will progress well enough to dc home with assist as needed and HH therapy follow-up        Follow Up Recommendations Home health PT;Supervision/Assistance - 24 hour HHRN for chronic disease management    Equipment Recommendations  3in1 (PT)    Recommendations for Other Services       Precautions / Restrictions Precautions Precautions: Fall      Mobility  Bed Mobility Overal bed mobility: Needs Assistance Bed Mobility: Supine to Sit     Supine to sit: Min assist     General bed mobility comments: mod to max verbal and tactile cues to initiate and then complete task; min assist to square off hips at EOB  Transfers Overall transfer level: Needs assistance Equipment used: Rolling walker (2 wheeled) Transfers: Sit to/from Stand Sit to Stand: Min assist         General transfer comment: Verbal and tactile cues to fully weight shift over feet to initiate pushing to stand; support for steadiness  Ambulation/Gait Ambulation/Gait assistance: Min assist Ambulation Distance (Feet): 5 Feet Assistive device: Rolling walker (2 wheeled) Gait Pattern/deviations: Step-to pattern     General Gait Details: very slow moving  with steps, at times simply stopping and requiring cues to continue  Stairs            Wheelchair Mobility    Modified Rankin (Stroke Patients Only)       Balance Overall balance assessment: Needs assistance         Standing balance support: Bilateral upper extremity supported Standing balance-Leahy Scale: Poor                               Pertinent Vitals/Pain Pain Assessment: No/denies pain    Home Living Family/patient expects to be discharged to:: Private residence Living Arrangements: Children Available Help at Discharge: Family;Available 24 hours/day Type of Home: House Home Access:  (to be determined)     Home Layout: One level Home Equipment: Walker - 2 wheels;Other (comment) (hoveround)      Prior Function Level of Independence: Independent with assistive device(s)         Comments: household amb with RW     Hand Dominance        Extremity/Trunk Assessment   Upper Extremity Assessment: Generalized weakness           Lower Extremity Assessment: Generalized weakness         Communication   Communication: No difficulties  Cognition Arousal/Alertness: Awake/alert Behavior During Therapy: Flat affect Overall Cognitive Status: Impaired/Different from baseline Area of Impairment: Orientation;Problem solving Orientation Level: Disoriented to;Time           Problem Solving: Slow processing;Decreased initiation;Difficulty sequencing  General Comments      Exercises        Assessment/Plan    PT Assessment Patient needs continued PT services  PT Diagnosis Difficulty walking;Abnormality of gait;Generalized weakness   PT Problem List Decreased strength;Decreased activity tolerance;Decreased balance;Decreased mobility;Decreased coordination;Decreased cognition;Decreased knowledge of use of DME;Decreased safety awareness  PT Treatment Interventions DME instruction;Gait training;Stair training;Functional  mobility training;Therapeutic activities;Therapeutic exercise;Patient/family education   PT Goals (Current goals can be found in the Care Plan section) Acute Rehab PT Goals Patient Stated Goal: wants to go home PT Goal Formulation: With patient Time For Goal Achievement: 05/25/14 Potential to Achieve Goals: Good    Frequency Min 3X/week   Barriers to discharge        Co-evaluation               End of Session Equipment Utilized During Treatment: Gait belt Activity Tolerance: Patient tolerated treatment well Patient left: in chair;with call bell/phone within reach Nurse Communication: Mobility status;Other (comment) (Need for chair alarm to be hooked up)         Time: 8469-6295 PT Time Calculation (min): 29 min   Charges:   PT Evaluation $Initial PT Evaluation Tier I: 1 Procedure PT Treatments $Gait Training: 8-22 mins $Therapeutic Activity: 8-22 mins   PT G Codes:          Roney Marion Hamff 05/11/2014, 1:17 PM  Roney Marion, [place]  Acute Rehabilitation Services Pager [phone] Office [phone]

## 2014-05-11 NOTE — Progress Notes (Addendum)
Patient's son expressing concern about her confusion.  Information about delirium provided to patient and son.  MD at bedside to discuss plan of care with patient and son.  Both patient and son deny additional questions or concerns after MD update.  Will continue to monitor.

## 2014-05-11 NOTE — Progress Notes (Signed)
[facility] PROGRESS NOTE Interim History: 79 y/o with HTN, HLD, CAD, atrial fibrillation and CHF. Came to ED with complaints of increase LE swelling SOB and weight gain of almost 20 pounds.  Filed Weights   05/09/14 0511 05/10/14 0354 05/11/14 0500  Weight: 96.072 kg (211 lb 12.8 oz) 95 kg (209 lb 7 oz) 92.2 kg (203 lb 4.2 oz)        Intake/Output Summary (Last 24 hours) at 05/11/14 1501 Last data filed at 05/11/14 1305  Gross per 24 hour  Intake    363 ml  Output   2200 ml  Net  -1837 ml     Assessment/Plan: 1-Acute on chronic diastolic CHF (congestive heart failure): in presence of diet indiscretion. -continue IV lasix, since she has improved volume will change to BID and also metolazone  -daily weight -continue low sodium diet -close follow up of BMET -2-D echo with preserved EF, no wall motion abnormalities.  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  4-SOB (shortness of breath): secondary to #1. Improving. Treatment as mentioned above.  5-HLD (hyperlipidemia): continue statins  6-A-fib: -continue b-blocker and coumadin per pharmacy (INr therapeutic)   7-CAD, NATIVE VESSEL: troponin negative. No CP currently. -continue current therapy  8-HYPERTENSION: stable and well controlled. Will continue current treatment.  9-pulmonary HTN: continue imdur and lasix.  10-rhinitis: will continue claritin and saline nasal spray. PRN chloraseptic spray  11-skin lesion and bleeding: bleeding due to coumadin -continue sarna lotion to control itching and prevent further skin damage -will use thrombipad PRN   Code Status: Full Family Communication: no family  at bedside Disposition Plan: to be determine   Consultants:  None   Procedures: ECHO:  - Left ventricle: The cavity size was normal. Systolic function was normal. The estimated ejection fraction was in the range of 55% to 60%. Wall motion was normal; there were no regional wall motion abnormalities. - Aortic valve: Trileaflet; normal thickness, mildly calcified leaflets. There was trivial regurgitation. - Mitral valve: There was moderate regurgitation. - Left atrium: The atrium was severely dilated. - Right ventricle: The cavity size was severely dilated. Wall thickness was normal. - Right atrium: The atrium was severely dilated. - Tricuspid valve: There was moderate-severe regurgitation. - Pulmonic valve: There was trivial regurgitation. - Pulmonary arteries: PA peak pressure: 44 mm Hg (S).  Antibiotics:  None   HPI/Subjective: No CP, feeling better and with improved breathing. Patient is slightly confused/disoriented this morning. Complaining of pain on her Legs  Objective: Filed Vitals:   05/11/14 0500 05/11/14 0728 05/11/14 1049 05/11/14 1454  BP: 140/65  136/57 147/67  Pulse: 60  61 59  Temp: 97.4 F (36.3 C)   97.4 F (36.3 C)  TempSrc: Oral   Oral  Resp: 20  20 18   Height:      Weight: 92.2 kg (203 lb 4.2 oz)     SpO2: 98% 98% 99% 98%     Exam:  General: Alert, awake, oriented x2, in no acute distress; denies CP. Feeling better and breathing better. Complaining of LE pain and feeling weak  HEENT: No bruits, no goiter. No JVD Heart: Regular rate; positive SEM, no rubs or gallops' 1-2+ LE edema  Lungs: no wheezing; decrease BS at bases with no frank crackles  Abdomen: Soft, nontender, nondistended, positive bowel sounds. There is increased girth on abd wall on exam (but improved from admission)  Neuro: Grossly intact, nonfocal.   Data Reviewed: Basic Metabolic Panel:  Recent Labs Lab 05/07/14 1659 05/08/14 0700 05/09/14 0425 05/10/14 0503  05/11/14 0833  NA 138 137 143 141 143  K 4.4 4.4 3.8 3.3* 3.2*  CL 100 101 101 98 97  CO2 24 21 23 23 27   GLUCOSE 103* 88 81 92 100*  BUN 51* 51* 55* 59* 62*  CREATININE 3.26* 3.20* 3.32* 3.47* 3.56*  CALCIUM 9.5 9.4 9.1 9.3 9.7   CBC:  Recent Labs Lab 05/07/14 1659 05/10/14 1555  WBC 3.5*  --   HGB 13.4 13.0  HCT 41.0 39.4  MCV 90.5  --   PLT 113*  --    BNP (last 3 results)  Recent Labs  10/28/13 1450 05/07/14 1659 05/10/14 0508  PROBNP 1368.0* 11688.0* 12366.0*    Recent Results (from the past 240 hour(s))  MRSA PCR SCREENING     Status: None   Collection Time    05/07/14  8:49 PM      Result Value Ref Range Status   MRSA by PCR NEGATIVE  NEGATIVE Final   Comment:            The GeneXpert MRSA Assay (FDA     approved for NASAL specimens     only), is one component of a     comprehensive MRSA colonization     surveillance program. It is not     intended to diagnose MRSA     infection nor to guide or     monitor treatment for     MRSA infections.     Studies: No results found.  Scheduled Meds: . ALPRAZolam  0.5 mg Oral QHS  . aspirin  81 mg Oral Daily  . furosemide  40 mg Intravenous BID  . isosorbide mononitrate  60 mg Oral Daily  . loratadine  10 mg Oral Daily  . magnesium oxide  400 mg Oral QHS  . metolazone  2.5 mg Oral BID  . metoprolol tartrate  75 mg Oral TID  . mometasone-formoterol  2 puff Inhalation BID  . pantoprazole  40 mg Oral BID  . [START ON 05/12/2014] potassium chloride  40 mEq Oral Daily  . simvastatin  5 mg Oral q1800  . sodium chloride  3 mL Intravenous Q12H  . warfarin  0.5 mg Oral ONCE-1800  . Warfarin - Pharmacist Dosing Inpatient   Does not apply q1800    Time > 30 minutes   Barton Dubois  [facility] Pager [phone]. If 8PM-8AM, please contact night-coverage at [email or web address], password [facility] 05/11/2014, 3:01 PM  LOS: 4 days

## 2014-05-11 NOTE — Progress Notes (Signed)
MD made aware as patient states she feels weaker and has slight tremors when moving arms.  PO potassium administered per order.  Will continue to monitor.

## 2014-05-12 DIAGNOSIS — R5381 Other malaise: Secondary | ICD-10-CM

## 2014-05-12 LAB — BASIC METABOLIC PANEL
ANION GAP: 20 — AB (ref 5–15)
BUN: 63 mg/dL — ABNORMAL HIGH (ref 6–23)
CO2: 26 mEq/L (ref 19–32)
Calcium: 9.6 mg/dL (ref 8.4–10.5)
Chloride: 97 mEq/L (ref 96–112)
Creatinine, Ser: 3.41 mg/dL — ABNORMAL HIGH (ref 0.50–1.10)
GFR calc non Af Amer: 12 mL/min — ABNORMAL LOW (ref >90)
GFR, EST AFRICAN AMERICAN: 14 mL/min — AB (ref >90)
Glucose, Bld: 90 mg/dL (ref 70–99)
POTASSIUM: 3 meq/L — AB (ref 3.7–5.3)
SODIUM: 143 meq/L (ref 137–147)

## 2014-05-12 LAB — PROTIME-INR
INR: 2.86 — ABNORMAL HIGH (ref 0.00–1.49)
Prothrombin Time: 30 seconds — ABNORMAL HIGH (ref 11.6–15.2)

## 2014-05-12 MED ORDER — ALPRAZOLAM 0.5 MG PO TABS: 0.5000 mg | ORAL_TABLET | Freq: Every evening | ORAL | Status: DC | PRN

## 2014-05-12 MED ORDER — METOLAZONE 2.5 MG PO TABS: 2.5000 mg | ORAL_TABLET | ORAL | Status: DC

## 2014-05-12 MED ORDER — FUROSEMIDE 80 MG PO TABS: 80.0000 mg | ORAL_TABLET | Freq: Two times a day (BID) | ORAL | Status: DC

## 2014-05-12 MED ORDER — LORATADINE 10 MG PO TABS: 10.0000 mg | ORAL_TABLET | Freq: Every day | ORAL | Status: DC

## 2014-05-12 MED ORDER — CAMPHOR-MENTHOL 0.5-0.5 % EX LOTN: TOPICAL_LOTION | CUTANEOUS | Status: DC | PRN

## 2014-05-12 NOTE — Discharge Instructions (Signed)
Information on my medicine - Coumadin   (Warfarin)  This medication education was reviewed with me or my healthcare representative as part of my discharge preparation.  The pharmacist that spoke with me during my hospital stay was:  Arman Bogus, [facility]  Why was Coumadin prescribed for you? Coumadin was prescribed for you because you have a blood clot or a medical condition that can cause an increased risk of forming blood clots. Blood clots can cause serious health problems by blocking the flow of blood to the heart, lung, or brain. Coumadin can prevent harmful blood clots from forming. As a reminder your indication for Coumadin is:   Select from menu  What test will check on my response to Coumadin? While on Coumadin (warfarin) you will need to have an INR test regularly to ensure that your dose is keeping you in the desired range. The INR (international normalized ratio) number is calculated from the result of the laboratory test called prothrombin time (PT).  If an INR APPOINTMENT HAS NOT ALREADY BEEN MADE FOR YOU please schedule an appointment to have this lab work done by your health care provider within 7 days. Your INR goal is usually a number between:  2 to 3 or your provider may give you a more narrow range like 2-2.5.  Ask your health care provider during an office visit what your goal INR is.  What  do you need to  know  About  COUMADIN? Take Coumadin (warfarin) exactly as prescribed by your healthcare provider about the same time each day.  DO NOT stop taking without talking to the doctor who prescribed the medication.  Stopping without other blood clot prevention medication to take the place of Coumadin may increase your risk of developing a new clot or stroke.  Get refills before you run out.  What do you do if you miss a dose? If you miss a dose, take it as soon as you remember on the same day then continue your regularly scheduled regimen the next day.  Do not take two doses  of Coumadin at the same time.  Important Safety Information A possible side effect of Coumadin (Warfarin) is an increased risk of bleeding. You should call your healthcare provider right away if you experience any of the following:   Bleeding from an injury or your nose that does not stop.   Unusual colored urine (red or dark brown) or unusual colored stools (red or black).   Unusual bruising for unknown reasons.   A serious fall or if you hit your head (even if there is no bleeding).  Some foods or medicines interact with Coumadin (warfarin) and might alter your response to warfarin. To help avoid this:   Eat a balanced diet, maintaining a consistent amount of Vitamin K.   Notify your provider about major diet changes you plan to make.   Avoid alcohol or limit your intake to 1 drink for women and 2 drinks for men per day. (1 drink is 5 oz. wine, 12 oz. beer, or 1.5 oz. liquor.)  Make sure that ANY health care provider who prescribes medication for you knows that you are taking Coumadin (warfarin).  Also make sure the healthcare provider who is monitoring your Coumadin knows when you have started a new medication including herbals and non-prescription products.  Coumadin (Warfarin)  Major Drug Interactions  Increased Warfarin Effect Decreased Warfarin Effect  Alcohol (large quantities) Antibiotics (esp. Septra/Bactrim, Flagyl, Cipro) Amiodarone (Cordarone) Aspirin (ASA) Cimetidine (Tagamet)  Megestrol (Megace) NSAIDs (ibuprofen, naproxen, etc.) Piroxicam (Feldene) Propafenone (Rythmol SR) Propranolol (Inderal) Isoniazid (INH) Posaconazole (Noxafil) Barbiturates (Phenobarbital) Carbamazepine (Tegretol) Chlordiazepoxide (Librium) Cholestyramine (Questran) Griseofulvin Oral Contraceptives Rifampin Sucralfate (Carafate) Vitamin K   Coumadin (Warfarin) Major Herbal Interactions  Increased Warfarin Effect Decreased Warfarin Effect  Garlic Ginseng Ginkgo biloba Coenzyme  Q10 Green tea St. Johns wort    Coumadin (Warfarin) FOOD Interactions  Eat a consistent number of servings per week of foods HIGH in Vitamin K (1 serving =  cup)  Collards (cooked, or boiled & drained) Kale (cooked, or boiled & drained) Mustard greens (cooked, or boiled & drained) Parsley *serving size only =  cup Spinach (cooked, or boiled & drained) Swiss chard (cooked, or boiled & drained) Turnip greens (cooked, or boiled & drained)  Eat a consistent number of servings per week of foods MEDIUM-HIGH in Vitamin K (1 serving = 1 cup)  Asparagus (cooked, or boiled & drained) Broccoli (cooked, boiled & drained, or raw & chopped) Brussel sprouts (cooked, or boiled & drained) *serving size only =  cup Lettuce, raw (green leaf, endive, romaine) Spinach, raw Turnip greens, raw & chopped   These websites have more information on Coumadin (warfarin):  FailFactory.se; VeganReport.com.au;

## 2014-05-12 NOTE — Progress Notes (Signed)
Pt.is A/Ox2 and is 1 person assist to [facility]. She had no c/o pain and no signs of distress. IV taken out. Pt.transported by wheelchair accompanied by NT for discharge home.

## 2014-05-12 NOTE — Evaluation (Signed)
Occupational Therapy Evaluation Patient Details Name: [name] MRN: [id number] DOB: 1934-09-02 Today's Date: 05/12/2014    History of Present Illness 79 y/o with HTN, HLD, CAD, atrial fibrillation and CHF. Came to ED with complaints of increase LE swelling SOB and weight gain of almost 20 pounds. -Acute on chronic diastolic CHF (congestive heart failure): in presence of diet indiscretion.   Clinical Impression   Pt admitted with above. MD came in session at end of OT session and states pt will be discharging home today. Recommending HHOT upon d/c. No goals set due to pt discharging-all further needs can be met at next venue of care.     Follow Up Recommendations  Home health OT;Supervision/Assistance - 24 hour    Equipment Recommendations  None recommended by OT    Recommendations for Other Services       Precautions / Restrictions Precautions Precautions: Fall Restrictions Weight Bearing Restrictions: No      Mobility Bed Mobility Overal bed mobility: Needs Assistance Bed Mobility: Sit to Sidelying;Sidelying to Sit   Sidelying to sit: Modified independent (Device/Increase time)     Sit to sidelying: Min assist General bed mobility comments: assist with LE.  Transfers Overall transfer level: Needs assistance Equipment used: Rolling walker (2 wheeled) Transfers: Sit to/from Stand Sit to Stand: Mod assist         General transfer comment: Mod A for sit to stand from bed,    Balance                                            ADL Overall ADL's : Needs assistance/impaired     Grooming: Wash/dry face;Brushing hair;Oral care;Min guard;Standing       Lower Body Bathing: Min guard;Sit to/from stand           Toilet Transfer: Min guard;Ambulation;RW;BSC (Mod A for sit to stand from bed)           Functional mobility during ADLs: Min guard;Rolling walker General ADL Comments: Pt performed grooming at sink. Spoke about sitting  for LB dressing and also about having bag on walker. Ambulated some in room. Cues to stand up straight at sink.     Vision                     Perception     Praxis      Pertinent Vitals/Pain Pain Assessment: No/denies pain     Hand Dominance     Extremity/Trunk Assessment Upper Extremity Assessment Upper Extremity Assessment: Generalized weakness   Lower Extremity Assessment Lower Extremity Assessment: Defer to PT evaluation       Communication Communication Communication: No difficulties   Cognition Arousal/Alertness: Awake/alert Behavior During Therapy: Flat affect Overall Cognitive Status: Impaired/Different from baseline               Problem Solving: Slow processing (would change what she said; not make sense)     General Comments       Exercises       Shoulder Instructions      Home Living Family/patient expects to be discharged to:: Private residence Living Arrangements: Children Available Help at Discharge: Family;Available 24 hours/day Type of Home: House       Home Layout: One level     Bathroom Shower/Tub: Tub/shower unit         Home Equipment: Environmental consultant -  2 wheels;Other (comment);Bedside commode;Shower seat (hoveround)          Prior Functioning/Environment Level of Independence: Independent with assistive device(s)        Comments: household amb with RW    OT Diagnosis: Generalized weakness   OT Problem List: Decreased strength;Decreased activity tolerance;Decreased knowledge of use of DME or AE;Decreased knowledge of precautions;Pain   OT Treatment/Interventions:      OT Goals(Current goals can be found in the care plan section) Acute Rehab OT Goals Patient Stated Goal: wants to go home  OT Frequency:     Barriers to D/C:            Co-evaluation              End of Session Equipment Utilized During Treatment: Gait belt;Rolling walker  Activity Tolerance: Patient tolerated treatment  well Patient left: in bed;with call bell/phone within reach;with bed alarm set;with family/visitor present;Other (comment) (MD in room)   Time: 7615-1834 OT Time Calculation (min): 24 min Charges:  OT General Charges $OT Visit: 1 Procedure OT Evaluation $Initial OT Evaluation Tier I: 1 Procedure OT Treatments $Self Care/Home Management : 8-22 mins G-CodesBenito Mccreedy OTR/L [phone] 05/12/2014, 11:51 AM

## 2014-05-12 NOTE — Plan of Care (Signed)
Problem: Discharge Progression Outcomes Goal: Able to perform self care activities Outcome: Adequate for Discharge With 1 person assistance.

## 2014-05-12 NOTE — Discharge Summary (Signed)
Physician Discharge Summary  [name]:[id number] DOB: Feb 23, 1935 DOA: 05/07/2014  PCP: Tamsen Roers, MD  Admit date: 05/07/2014 Discharge date: 05/12/2014  Time spent: >30 minutes  Recommendations for Outpatient Follow-up:  1-check BMET to follow renal function and electrolytes 2-close follow up to volume status and further medication adjustments for long term therapy.  BNP    Component Value Date/Time   PROBNP 12366.0* 05/10/2014 0508   Filed Weights   05/10/14 0354 05/11/14 0500 05/12/14 0400  Weight: 95 kg (209 lb 7 oz) 92.2 kg (203 lb 4.2 oz) 90.765 kg (200 lb 1.6 oz)    Discharge Diagnoses:  Principal Problem:   Acute diastolic CHF (congestive heart failure) Active Problems:   HYPERTENSION   CAD, NATIVE VESSEL   Acute-on-chronic kidney injury   A-fib   HLD (hyperlipidemia)   SOB (shortness of breath)   CKD (chronic kidney disease)   Long term (current) use of anticoagulants   Discharge Condition:stable and improved. Will discharge home with home health services and follow up with nephrology and cardiology as an outpatient.  Diet recommendation: low sodium diet  History of present illness:  79 y.o. female with a history of CAD, Diastolic CHF, P.A.Fib, S/P Pacemaker, and CKD who presents to the ED with complaints of increased SOB and worsening Edema over the past 2 weeks. She reports having swelling around her ABD as well. She was advised to increase her lasix but her symptoms continued to worsen. She reports a 20 pound weight gain over the past month. She was evalauted in the ED and found to have a BNP =11688.0, and a Chest X-ray revealing CHF. Her last 2D Echo was in 03/2013. She was referred for medical admission.    Hospital Course:  1-Acute on chronic diastolic CHF (congestive heart failure): in presence of diet indiscretion.  -improved and with breathing now back to baseline. Still with mild fluid overload on exam; but limited acute diuresis given  renal failure. -after discussing with patient, her son and nephrologist Arty Baumgartner, MD); will discharge on increase dose of lasix to try keep compensated/clear few extra pounds on board at discharge. -will also schedule her metolazon to twice a week -daily weight  -continue low sodium diet  -close follow up of BMET as an outpatient and if she continue struggling controlling volume or renal function worsen will need to start HD -2-D echo with preserved EF, no wall motion abnormalities.  -patient is approx 10 pounds lighter at discharge -BNP went up, but unclear measure given renal failure (which worsen at the time BNP was measured)  2-CKD (chronic kidney disease): stage 4-5  -will need close follow up as an outpatient to determine if renal function decline further timing for HD.   3-Long term (current) use of anticoagulants: continue coumadin.   4-SOB (shortness of breath): secondary to #1. Improved and at baseline at discharge. Treatment as mentioned above.   5-HLD (hyperlipidemia): continue statins   6-A-fib: continue b-blocker and coumadin. Rate controlled   7-CAD, NATIVE VESSEL: troponin negative. No CP currently.  -continue current therapy   8-HYPERTENSION: stable and well controlled. Will continue current treatment.   9-pulmonary HTN: continue imdur and lasix.   10-rhinitis: will continue claritin and nasonex    11-skin lesion and bleeding: bleeding due to coumadin  -continue sarna lotion to control itching and prevent further skin damage  -controlled with thrombipad while inpatient  12-physical deconditioning: will discharge home with [facility] services (PT,OT, RN)   Procedures: ECHO: 05/08/14 - Left ventricle:  The cavity size was normal. Systolic function was normal. The estimated ejection fraction was in the range of 55% to 60%. Wall motion was normal; there were no regional wall motion abnormalities. - Aortic valve: Trileaflet; normal thickness, mildly  calcified leaflets. There was trivial regurgitation. - Mitral valve: There was moderate regurgitation. - Left atrium: The atrium was severely dilated. - Right ventricle: The cavity size was severely dilated. Wall thickness was normal. - Right atrium: The atrium was severely dilated. - Tricuspid valve: There was moderate-severe regurgitation. - Pulmonic valve: There was trivial regurgitation. - Pulmonary arteries: PA peak pressure: 44 mm Hg (S).  Consultations:  None   Discharge Exam: Filed Vitals:   05/12/14 1022  BP: 142/56  Pulse: 65  Temp:   Resp: 20   General: Alert, awake, oriented x3, in no acute distress; denies CP. Feeling better and breathing better.  HEENT: No bruits, no goiter. No JVD  Heart: Regular rate; positive SEM, no rubs or gallops' 1++ LE edema  Lungs: no wheezing; decrease BS at bases with no frank crackles  Abdomen: Soft, nontender, nondistended, positive bowel sounds. There is increased girth on abd wall on exam (but significantly improved from admission)  Neuro: Grossly intact, nonfocal.   Discharge Instructions  Discharge Instructions   Diet - low sodium heart healthy    Complete by:  As directed      Discharge instructions    Complete by:  As directed   Take medications as prescribed Follow a low sodium diet (less than 2 grams daily) Arrange follow up with PCP in 10 days Follow up with cardiology in 1 week Follow up with Dr. Arty Baumgartner as already scheduled            Medication List         ALPRAZolam 0.5 MG tablet  Commonly known as:  XANAX  Take 1 tablet (0.5 mg total) by mouth at bedtime as needed for anxiety or sleep.     aspirin 81 MG tablet  Take 81 mg by mouth daily.     camphor-menthol lotion  Commonly known as:  SARNA  Apply topically as needed for itching.     diphenoxylate-atropine 2.5-0.025 MG per tablet  Commonly known as:  LOMOTIL  Take 1-2 tablets by mouth every 8 (eight) hours as needed for diarrhea or loose  stools.     Fluticasone-Salmeterol 100-50 MCG/DOSE Aepb  Commonly known as:  ADVAIR DISKUS  Inhale 1 puff into the lungs 2 (two) times daily.     furosemide 80 MG tablet  Commonly known as:  LASIX  Take 1 tablet (80 mg total) by mouth 2 (two) times daily.     hydrALAZINE 25 MG tablet  Commonly known as:  APRESOLINE  Take 1 tablet (25 mg total) by mouth 3 (three) times daily.     isosorbide mononitrate 60 MG 24 hr tablet  Commonly known as:  IMDUR  Take 120 mg by mouth daily.     loratadine 10 MG tablet  Commonly known as:  CLARITIN  Take 1 tablet (10 mg total) by mouth daily.     lovastatin 10 MG tablet  Commonly known as:  MEVACOR  Take 10 mg by mouth at bedtime.     magnesium oxide 400 MG tablet  Commonly known as:  MAG-OX  Take 400 mg by mouth at bedtime.     metolazone 2.5 MG tablet  Commonly known as:  ZAROXOLYN  Take 1 tablet (2.5 mg total) by mouth 2 (two)  times a week. Monday and Friday     metoprolol tartrate 25 MG tablet  Commonly known as:  LOPRESSOR  Take 75 mg by mouth 3 (three) times daily.     mometasone 50 MCG/ACT nasal spray  Commonly known as:  NASONEX  Place 2 sprays into the nose daily as needed (for congrestion).     nitroGLYCERIN 0.4 MG SL tablet  Commonly known as:  NITROSTAT  Place 0.4 mg under the tongue every 5 (five) minutes as needed for chest pain.     ondansetron 4 MG tablet  Commonly known as:  ZOFRAN  Take 1 tablet (4 mg total) by mouth every 8 (eight) hours as needed for nausea or vomiting.     oxyCODONE-acetaminophen 5-325 MG per tablet  Commonly known as:  PERCOCET/ROXICET  Take 1 tablet by mouth every 6 (six) hours as needed for severe pain. For pain     potassium chloride SA 20 MEQ tablet  Commonly known as:  K-DUR,KLOR-CON  2 tablets (40 mEq) twice daily     PROTONIX 40 MG tablet  Generic drug:  pantoprazole  Take 40 mg by mouth 2 (two) times daily.     warfarin 2 MG tablet  Commonly known as:  COUMADIN  Take 2-3 mg  by mouth daily at 6 PM. Takes 1 tablets (3mg ) on Mondays and Fridays.  Takes 1 tablet (2mg ) on all remaining days.       Allergies  Allergen Reactions  . Amiodarone Hives  . Propylthiouracil Rash       Follow-up Information   Follow up with Jenkins Rouge, MD. Schedule an appointment as soon as possible for a visit in 1 week.   Specialty:  Cardiology   Contact information:   [address] [place] [address] [phone]       Follow up with Tamsen Roers, MD. Schedule an appointment as soon as possible for a visit in 10 days.   Specialty:  Family Medicine   Contact information:   [address] [place] [address] [phone]       The results of significant diagnostics from this hospitalization (including imaging, microbiology, ancillary and laboratory) are listed below for reference.    Significant Diagnostic Studies: Dg Chest 2 View  05/07/2014   CLINICAL DATA:  One month history of cough and shortness of breath with onset of emesis postprandially over the past 2-3 weeks ; chronic renal insufficiency nearing dialysis  EXAM: CHEST  2 VIEW  COMPARISON:  Portable chest x-ray of September 26, 2013  FINDINGS: The right lung is mildly hypoinflated. A possibly secondary to chronically elevated hemidiaphragm. There is no alveolar infiltrate. The interstitial markings are slightly increased overall. The cardiac silhouette is enlarged. The pulmonary vascularity is prominent centrally. There is no significant pleural effusion. The permanent pacemaker electrodes are in appropriate position. The bony thorax exhibits no acute abnormalities.  IMPRESSION: The study is limited due to hypoinflation of the right lung. This appears chronic and may be related to hemidiaphragm weakness or paralysis. Low-grade compensated CHF is suspected.   Electronically Signed   By: David  Martinique   On: 05/07/2014 18:31    Microbiology: Recent Results (from the past 240 hour(s))  MRSA PCR  SCREENING     Status: None   Collection Time    05/07/14  8:49 PM      Result Value Ref Range Status   MRSA by PCR NEGATIVE  NEGATIVE Final   Comment:  The GeneXpert MRSA Assay (FDA     approved for NASAL specimens     only), is one component of a     comprehensive MRSA colonization     surveillance program. It is not     intended to diagnose MRSA     infection nor to guide or     monitor treatment for     MRSA infections.     Labs: Basic Metabolic Panel:  Recent Labs Lab 05/08/14 0700 05/09/14 0425 05/10/14 0503 05/11/14 0833 05/12/14 0435  NA 137 143 141 143 143  K 4.4 3.8 3.3* 3.2* 3.0*  CL 101 101 98 97 97  CO2 21 23 23 27 26   GLUCOSE 88 81 92 100* 90  BUN 51* 55* 59* 62* 63*  CREATININE 3.20* 3.32* 3.47* 3.56* 3.41*  CALCIUM 9.4 9.1 9.3 9.7 9.6   CBC:  Recent Labs Lab 05/07/14 1659 05/10/14 1555  WBC 3.5*  --   HGB 13.4 13.0  HCT 41.0 39.4  MCV 90.5  --   PLT 113*  --    BNP (last 3 results)  Recent Labs  10/28/13 1450 05/07/14 1659 05/10/14 0508  PROBNP 1368.0* 11688.0* 12366.0*   Signed:  Barton Dubois  [facility] 05/12/2014, 1:06 PM

## 2014-05-19 ENCOUNTER — Ambulatory Visit (INDEPENDENT_AMBULATORY_CARE_PROVIDER_SITE_OTHER): Payer: Medicare Other | Admitting: Physician Assistant

## 2014-05-19 ENCOUNTER — Encounter: Payer: Self-pay | Admitting: Physician Assistant

## 2014-05-19 VITALS — BP 134/58 | HR 64 | Ht 65.0 in | Wt 198.0 lb

## 2014-05-19 DIAGNOSIS — N184 Chronic kidney disease, stage 4 (severe): Secondary | ICD-10-CM

## 2014-05-19 DIAGNOSIS — I2510 Atherosclerotic heart disease of native coronary artery without angina pectoris: Secondary | ICD-10-CM

## 2014-05-19 DIAGNOSIS — I10 Essential (primary) hypertension: Secondary | ICD-10-CM

## 2014-05-19 DIAGNOSIS — Z7901 Long term (current) use of anticoagulants: Secondary | ICD-10-CM

## 2014-05-19 DIAGNOSIS — I5032 Chronic diastolic (congestive) heart failure: Secondary | ICD-10-CM

## 2014-05-19 NOTE — Assessment & Plan Note (Signed)
Primary care manages

## 2014-05-19 NOTE — Assessment & Plan Note (Signed)
Patient's diastolic heart failure is well compensated. I'm worried about her kidneys but she sees the renal doctors this week and they will do blood work. Continue current therapy.

## 2014-05-19 NOTE — Assessment & Plan Note (Signed)
No chest pain

## 2014-05-19 NOTE — Progress Notes (Signed)
HPI: This is a 79 year old female patient Dr.Nishan who has history of CAD status post drug-eluting stent to the OM in 2006, rotational atherectomy and drug-eluting stent to the LAD in 4403, diastolic heart failure, paroxysmal atrial fibrillation status post pacemaker, and chronic kidney disease-stage IV hospital with acute on chronic congestive heart failure the presence of dietary indiscretion. She was diuresed approximately 10 pounds but this was limited do to renal failure. Dr. Arty Baumgartner follows her closely and will see her back Thursday.  The patient is doing much better at home. Physical therapy is working with her and she is starting to walk again. Her weight is down to 198 pounds(was 213 and an admission). She is taking Lasix 80 mg twice a day and metolazone 2.5 mg Mondays and Fridays. She denies any dyspnea, dyspnea on exertion, chest pain or edema. Her Coumadin will be checked by her primary care tomorrow.  Allergies  Allergen Reactions  . Amiodarone Hives  . Propylthiouracil Rash     Current Outpatient Prescriptions  Medication Sig Dispense Refill  . ALPRAZolam (XANAX) 0.5 MG tablet Take 1 tablet (0.5 mg total) by mouth at bedtime as needed for anxiety or sleep.      Marland Kitchen aspirin 81 MG tablet Take 81 mg by mouth daily.      . camphor-menthol (SARNA) lotion Apply topically as needed for itching.  222 mL  0  . diphenoxylate-atropine (LOMOTIL) 2.5-0.025 MG per tablet Take 1-2 tablets by mouth every 8 (eight) hours as needed for diarrhea or loose stools.      . Fluticasone-Salmeterol (ADVAIR DISKUS) 100-50 MCG/DOSE AEPB Inhale 1 puff into the lungs 2 (two) times daily.  14 each  0  . furosemide (LASIX) 80 MG tablet Take 1 tablet (80 mg total) by mouth 2 (two) times daily.  60 tablet  1  . hydrALAZINE (APRESOLINE) 25 MG tablet Take 1 tablet (25 mg total) by mouth 3 (three) times daily.  270 tablet  0  . isosorbide mononitrate (IMDUR) 60 MG 24 hr tablet Take 120 mg by mouth  daily.      Marland Kitchen loratadine (CLARITIN) 10 MG tablet Take 1 tablet (10 mg total) by mouth daily.  30 tablet  1  . lovastatin (MEVACOR) 10 MG tablet Take 10 mg by mouth at bedtime.       . magnesium oxide (MAG-OX) 400 MG tablet Take 400 mg by mouth at bedtime.       . metolazone (ZAROXOLYN) 2.5 MG tablet Take 1 tablet (2.5 mg total) by mouth 2 (two) times a week. Monday and Friday  30 tablet  1  . metoprolol tartrate (LOPRESSOR) 25 MG tablet Take 75 mg by mouth 3 (three) times daily.      . mometasone (NASONEX) 50 MCG/ACT nasal spray Place 2 sprays into the nose daily as needed (for congrestion).       . nitroGLYCERIN (NITROSTAT) 0.4 MG SL tablet Place 0.4 mg under the tongue every 5 (five) minutes as needed for chest pain.      Marland Kitchen ondansetron (ZOFRAN) 4 MG tablet Take 1 tablet (4 mg total) by mouth every 8 (eight) hours as needed for nausea or vomiting.  40 tablet  2  . oxyCODONE-acetaminophen (PERCOCET/ROXICET) 5-325 MG per tablet Take 1 tablet by mouth every 6 (six) hours as needed for severe pain. For pain      . pantoprazole (PROTONIX) 40 MG tablet Take 40 mg by mouth 2 (two) times daily.       Marland Kitchen  potassium chloride SA (K-DUR,KLOR-CON) 20 MEQ tablet 2 tablets (40 mEq) twice daily      . warfarin (COUMADIN) 2 MG tablet Take 2-3 mg by mouth daily at 6 PM. Takes 1 tablets (3mg ) on Mondays and Fridays.  Takes 1 tablet (2mg ) on all remaining days.      . [DISCONTINUED] FLUoxetine (PROZAC) 20 MG capsule Take 20 mg by mouth daily.         No current facility-administered medications for this visit.    Past Medical History  Diagnosis Date  . Cellulitis and abscess of unspecified site     a. h/o MSRA cellulitis of abdominal wall.  Marland Kitchen CAD (coronary artery disease)     a. DES to OM 01/2005. b. Rotational atherotomy/DES to prox LAD 02/2008. c. Cath 07/2013: stable, for med rx.  . Edema   . Warfarin anticoagulation     a. Followed by PCP.  Marland Kitchen GI bleed     a. EGD 01/2007: antral ulcers related to  NSAID/aspirin use.  . Peripheral vascular disease   . Hyperthyroidism     a. Hx of intolerance to PTU.  Marland Kitchen Blood transfusion years ago    "heart problems" (09/26/2013)  . H/O hiatal hernia   . Umbilical hernia     unrepaired (11/24/11)  . Valvular heart disease     Echo 11/2011: mild-mod MR.  Marland Kitchen Hypertension     takes Amlodipine,Hydralazine,and Imdur daily  . Hyperlipidemia     takes Lovastatin daily  . SSS (sick sinus syndrome)     a. H/o AV node ablation 2008 secondary to difficult to control afib/flutter, with subsequent pacer.  . Atrial fibrillation with rapid ventricular response     a. H/o amiodarone thyroiditis. b. s/p AV node ablation 2008 with implantation of PPM 01/2007, upgraded to CRT-P 06/2007.;takes Coumadin daily  . GERD (gastroesophageal reflux disease)     takes Protonix daily  . Chronic diastolic CHF (congestive heart failure)     takes Lasix daily and Spironolactone   . Pacemaker     [employer]  . History of gout   . Bruises easily   . Hemorrhoids   . Depression     takes Xanax daily  . Insomnia     takes Xanax if needed  . Heart murmur   . Chronic renal insufficiency     Cr ~2.3.  . Polycystic kidney disease     a. Per remote E-Chart records.  . Kidney stones   . Myocardial infarction     "I've had 2-3; last one was in /1996" (09/26/2013)  . Exertional shortness of breath   . Migraines     "last one was a pretty good while ago" (09/26/2013)  . Arthritis     "all over" (09/26/2013)  . Chronic lower back pain     deteriorating disc  . Anxiety   . Iron deficiency anemia     takes an Iron Pill daily  . Pneumonia     Past Surgical History  Procedure Laterality Date  . Esophagogastroduodenoscopy    . Tonsillectomy  ~ 1960  . Laminectomy and microdiscectomy lumbar spine  08/2003  . Cardioversion  10/2004; 02/2007    /E-chart  . Cholecystectomy    . Appendectomy  1954  . Vaginal hysterectomy  ?1981  . Av node ablation  02/2007    /E-chart  . Insert /  replace / remove pacemaker  01/2007    initial placement  . Insert / replace / remove pacemaker  06/2007  upgraded/E-chart  . Av fistula placement, brachiocephalic Right 02/8937    "didn't work; my veins were too small"  . Joint replacement    . Total knee arthroplasty Left 01/2009  . Bladder and bowel  1970's    "tacked; damaged my intestines & had to remove some; done @ [facility]  . Cataract extraction w/ intraocular lens  implant, bilateral Bilateral   . Bascilic vein transposition Right 06/21/2013    Procedure: [place];  Surgeon: Serafina Mitchell, MD;  Location: [facility] OR;  Service: Vascular;  Laterality: Right;  . Esophagogastroduodenoscopy N/A 07/19/2013    Procedure: ESOPHAGOGASTRODUODENOSCOPY (EGD);  Surgeon: Lafayette Dragon, MD;  Location: [facility] ENDOSCOPY;  Service: Endoscopy;  Laterality: N/A;  . Balloon dilation N/A 07/19/2013    Procedure: BALLOON DILATION;  Surgeon: Lafayette Dragon, MD;  Location[facility] END[facility]COPY;  Service: Endoscopy;  Laterality: N/A;  . Esophagogastroduodenoscopy N/A 07/20/2013    Procedure: ESOPHAGOGASTRODUODENOSCOPY (EGD);  Surgeon: Lafayette Dragon, MD;  Location: [facility] ENDOSCOPY;  Service: Endoscopy;  Laterality: N/A;  with removal of polyp in pylorus  . Bascilic vein transposition Right 09/20/2013    Procedure: 2ND STAGE RIGHT BASCILIC VEIN TRANSPOSITION; ULTRSOUND GUIDED;  Surgeon: Serafina Mitchell, MD;  Location: [facility] OR;  Service: Vascular;  Laterality: Right;  . Cardiac catheterization  2006; 2008; 2009; 01/13/2011  . Coronary angioplasty with stent placement  ~ 2007; ?    "had a total of 2 stents put in" 92/07/2014)  . Back surgery      Family History  Problem Relation Age of Onset  . Colon cancer Neg Hx   . Thyroid disease Neg Hx   . Stroke Brother   . Hypertension Brother   . Kidney disease Brother   . Kidney disease Brother   . Other Mother     Sepsis and perforated colon  . Kidney disease Mother   . Other Father     Motor vehicle  accident  . Learning disabilities Son   . Kidney disease Maternal Aunt   . Kidney disease Maternal Grandmother     History   Social History  . Marital Status: Widowed    Spouse Name: N/A    Number of Children: N/A  . Years of Education: N/A   Occupational History  . Not on file.   Social History Main Topics  . Smoking status: Former Smoker -- 1.00 packs/day for 50 years    Types: Cigarettes    Quit date: 07/13/1995  . Smokeless tobacco: Never Used     Comment: quit smoking around 1996  . Alcohol Use: No  . Drug Use: No  . Sexual Activity: No   Other Topics Concern  . Not on file   Social History Narrative   Lives in [place].    ROS: See history of present illness otherwise negative  BP 134/58  Pulse 64  Ht 5\' 5"  (1.651 m)  Wt 198 lb (89.812 kg)  BMI 32.95 kg/m2  PHYSICAL EXAM: Well-nournished, in no acute distress. Neck: No JVD, HJR, Bruit, or thyroid enlargement  Lungs: Decreased breath sounds at the bases but No tachypnea, clear without wheezing, rales, or rhonchi  Cardiovascular: Irregular irregular, PMI not displaced, 2/6 systolic murmur at the left sternal border, no gallops, bruit, thrill, or heave.  Abdomen: BS normal. Soft without organomegaly, masses, lesions or tenderness.  Extremities: Mild edema bilaterally with brawny changes decreased distal pulses  SKin: Warm, no lesions or rashes   Musculoskeletal: No deformities  Neuro: no focal signs   Wt Readings from Last 3 Encounters:  05/12/14 200 lb 1.6 oz (90.765 kg)  04/25/14 207 lb 9.6 oz (94.167 kg)  03/03/14 201 lb (91.173 kg)     EKG: Paced rhythm underlying atrial fibrillation

## 2014-05-19 NOTE — Assessment & Plan Note (Signed)
To see Dr.Colodonato Thurs with blood work.

## 2014-05-19 NOTE — Assessment & Plan Note (Signed)
Blood pressure stable ? ?

## 2014-05-19 NOTE — Patient Instructions (Signed)
Your physician recommends that you continue on your current medications as directed. Please refer to the Current Medication list given to you today.  Your physician recommends that you schedule a follow-up appointment in: 2 month ov with Dr Johnsie Cancel

## 2014-07-01 ENCOUNTER — Emergency Department (HOSPITAL_COMMUNITY): Payer: Medicare Other

## 2014-07-01 ENCOUNTER — Encounter (HOSPITAL_COMMUNITY): Payer: Self-pay | Admitting: Adult Health

## 2014-07-01 ENCOUNTER — Emergency Department (HOSPITAL_COMMUNITY)
Admission: EM | Admit: 2014-07-01 | Discharge: 2014-07-01 | Disposition: A | Discharge status: Home / Self Care | Payer: Medicare Other | Attending: Emergency Medicine | Admitting: Emergency Medicine

## 2014-07-01 DIAGNOSIS — L8990 Pressure ulcer of unspecified site, unspecified stage: Secondary | ICD-10-CM | POA: Diagnosis not present

## 2014-07-01 DIAGNOSIS — R011 Cardiac murmur, unspecified: Secondary | ICD-10-CM | POA: Diagnosis not present

## 2014-07-01 DIAGNOSIS — N189 Chronic kidney disease, unspecified: Secondary | ICD-10-CM | POA: Diagnosis not present

## 2014-07-01 DIAGNOSIS — Z872 Personal history of diseases of the skin and subcutaneous tissue: Secondary | ICD-10-CM | POA: Diagnosis not present

## 2014-07-01 DIAGNOSIS — N390 Urinary tract infection, site not specified: Secondary | ICD-10-CM | POA: Insufficient documentation

## 2014-07-01 DIAGNOSIS — Z8742 Personal history of other diseases of the female genital tract: Secondary | ICD-10-CM | POA: Diagnosis not present

## 2014-07-01 DIAGNOSIS — Z7901 Long term (current) use of anticoagulants: Secondary | ICD-10-CM | POA: Diagnosis not present

## 2014-07-01 DIAGNOSIS — Z950 Presence of cardiac pacemaker: Secondary | ICD-10-CM | POA: Insufficient documentation

## 2014-07-01 DIAGNOSIS — K219 Gastro-esophageal reflux disease without esophagitis: Secondary | ICD-10-CM | POA: Diagnosis not present

## 2014-07-01 DIAGNOSIS — I252 Old myocardial infarction: Secondary | ICD-10-CM | POA: Insufficient documentation

## 2014-07-01 DIAGNOSIS — R5383 Other fatigue: Secondary | ICD-10-CM | POA: Insufficient documentation

## 2014-07-01 DIAGNOSIS — Z87891 Personal history of nicotine dependence: Secondary | ICD-10-CM | POA: Insufficient documentation

## 2014-07-01 DIAGNOSIS — Z87442 Personal history of urinary calculi: Secondary | ICD-10-CM | POA: Diagnosis not present

## 2014-07-01 DIAGNOSIS — Z79899 Other long term (current) drug therapy: Secondary | ICD-10-CM | POA: Insufficient documentation

## 2014-07-01 DIAGNOSIS — E785 Hyperlipidemia, unspecified: Secondary | ICD-10-CM | POA: Insufficient documentation

## 2014-07-01 DIAGNOSIS — I2510 Atherosclerotic heart disease of native coronary artery without angina pectoris: Secondary | ICD-10-CM | POA: Diagnosis not present

## 2014-07-01 DIAGNOSIS — I129 Hypertensive chronic kidney disease with stage 1 through stage 4 chronic kidney disease, or unspecified chronic kidney disease: Secondary | ICD-10-CM | POA: Diagnosis not present

## 2014-07-01 DIAGNOSIS — F418 Other specified anxiety disorders: Secondary | ICD-10-CM | POA: Insufficient documentation

## 2014-07-01 DIAGNOSIS — R112 Nausea with vomiting, unspecified: Secondary | ICD-10-CM | POA: Diagnosis present

## 2014-07-01 DIAGNOSIS — Z8701 Personal history of pneumonia (recurrent): Secondary | ICD-10-CM | POA: Insufficient documentation

## 2014-07-01 DIAGNOSIS — R197 Diarrhea, unspecified: Secondary | ICD-10-CM | POA: Diagnosis not present

## 2014-07-01 DIAGNOSIS — Z7982 Long term (current) use of aspirin: Secondary | ICD-10-CM | POA: Insufficient documentation

## 2014-07-01 DIAGNOSIS — Z9889 Other specified postprocedural states: Secondary | ICD-10-CM | POA: Insufficient documentation

## 2014-07-01 DIAGNOSIS — M1990 Unspecified osteoarthritis, unspecified site: Secondary | ICD-10-CM | POA: Insufficient documentation

## 2014-07-01 DIAGNOSIS — I5032 Chronic diastolic (congestive) heart failure: Secondary | ICD-10-CM | POA: Diagnosis not present

## 2014-07-01 DIAGNOSIS — R531 Weakness: Secondary | ICD-10-CM

## 2014-07-01 LAB — CBC
HCT: 37.2 % (ref 36.0–46.0)
Hemoglobin: 12.3 g/dL (ref 12.0–15.0)
MCH: 29.2 pg (ref 26.0–34.0)
MCHC: 33.1 g/dL (ref 30.0–36.0)
MCV: 88.4 fL (ref 78.0–100.0)
PLATELETS: 139 10*3/uL — AB (ref 150–400)
RBC: 4.21 MIL/uL (ref 3.87–5.11)
RDW: 15.6 % — ABNORMAL HIGH (ref 11.5–15.5)
WBC: 3.9 10*3/uL — AB (ref 4.0–10.5)

## 2014-07-01 LAB — COMPREHENSIVE METABOLIC PANEL
ALT: 8 U/L (ref 0–35)
ANION GAP: 15 (ref 5–15)
AST: 20 U/L (ref 0–37)
Albumin: 3 g/dL — ABNORMAL LOW (ref 3.5–5.2)
Alkaline Phosphatase: 73 U/L (ref 39–117)
BUN: 65 mg/dL — AB (ref 6–23)
CALCIUM: 9.4 mg/dL (ref 8.4–10.5)
CO2: 24 mEq/L (ref 19–32)
Chloride: 98 mEq/L (ref 96–112)
Creatinine, Ser: 3.39 mg/dL — ABNORMAL HIGH (ref 0.50–1.10)
GFR calc non Af Amer: 12 mL/min — ABNORMAL LOW (ref >90)
GFR, EST AFRICAN AMERICAN: 14 mL/min — AB (ref >90)
GLUCOSE: 105 mg/dL — AB (ref 70–99)
Potassium: 4.2 mEq/L (ref 3.7–5.3)
SODIUM: 137 meq/L (ref 137–147)
TOTAL PROTEIN: 7.2 g/dL (ref 6.0–8.3)
Total Bilirubin: 1.5 mg/dL — ABNORMAL HIGH (ref 0.3–1.2)

## 2014-07-01 LAB — URINALYSIS, ROUTINE W REFLEX MICROSCOPIC
Bilirubin Urine: NEGATIVE
Glucose, UA: NEGATIVE mg/dL
Hgb urine dipstick: NEGATIVE
Ketones, ur: NEGATIVE mg/dL
Nitrite: NEGATIVE
PROTEIN: NEGATIVE mg/dL
Specific Gravity, Urine: <1.005 — ABNORMAL LOW (ref 1.005–1.030)
UROBILINOGEN UA: 0.2 mg/dL (ref 0.0–1.0)
pH: 7 (ref 5.0–8.0)

## 2014-07-01 LAB — URINE MICROSCOPIC-ADD ON

## 2014-07-01 LAB — I-STAT TROPONIN, ED: Troponin i, poc: 0.01 ng/mL (ref 0.00–0.08)

## 2014-07-01 LAB — LIPASE, BLOOD: Lipase: 34 U/L (ref 11–59)

## 2014-07-01 MED ORDER — ONDANSETRON HCL 4 MG PO TABS: 4.0000 mg | ORAL_TABLET | Freq: Four times a day (QID) | ORAL | Status: DC

## 2014-07-01 MED ORDER — CEPHALEXIN 500 MG PO CAPS: 500.0000 mg | ORAL_CAPSULE | Freq: Two times a day (BID) | ORAL | Status: DC

## 2014-07-01 NOTE — Discharge Instructions (Signed)
Please follow the directions provided.  Be sure to follow-up with your primary care provider regarding today's visit.  We have established home health wound care to check and treat the pressure ulcer on your hip.  Please take the antibiotic as directed for your urinary tract infection. Don't hesitate to return for any new, worsening or concerning symptoms.     SEEK IMMEDIATE MEDICAL CARE IF:  You have severe back pain or lower abdominal pain.  You develop chills.  You have nausea or vomiting.  You have continued burning or discomfort with urination. You have increasing redness, swelling, or pain in your pressure ulcer.  You notice pus coming from your pressure ulcer.  You notice a bad smell coming from the wound or dressing.  Your pressure ulcer opens up again.

## 2014-07-01 NOTE — ED Provider Notes (Signed)
Patient presented to the ER with generalized weakness with nausea. Patient reports that she has not been able to eat and has been losing weight.  Face to face Exam: HEENT - PERRLA Lungs - CTAB Heart - RRR, no M/R/G Abd - S/NT/ND Neuro - alert, oriented x3  Plan: Workup unremarkable. Vital signs stable. No change in renal function. Remainder of workup did not show any obvious cause of her symptoms. Will need follow-up as an outpatient with primary care and possibly GI.   Orpah Greek, MD 07/01/14 2109

## 2014-07-01 NOTE — Care Management (Signed)
  CARE MANAGEMENT ED NOTE 07/01/2014  Patient:  [name], [name]   Account Number:  [id number]  Date Initiated:  07/01/2014  Documentation initiated by:  Laurena Slimmer  Subjective/Objective Assessment:     Subjective/Objective Assessment Detail:   presenting with report of general weakness and intermittent nausea and vomiting x several months.  She states she has been seen by her PCP for the nausea, vomting and weakness in the past and started on an acid reducer which seemed to improve her symptoms for a while but recently the intermittent nausea and vomiting has returned.  It does not occur every day but most recently she vomited once yesterday.  She endorses some diarrhea also     Action/Plan:   Home Health  RN  PT   Action/Plan Detail:   Anticipated DC Date:  07/01/2014     Status Recommendation to Physician:   Result of Recommendation:    Other ED Services  Appropriate Status [place]  CM consult   [facility] Choice  HOME HEALTH   Choice offered to / List presented to:  C-1 Patient     [place] arranged  HH-1 RN  [place].    Status of service:  Completed, signed off  ED Comments:   ED Comments Detail:  07/01/14 8:29pm Wendi Maya RN BSN NCM [phone] ED CM received consult from E. Tysinger NP in the ED concerning HH weakness N/V and left buttock decub stage II wound care. Patient has had 2 ED visits and 1 inpatient hospitalization in the past 6 months. Insurance is [place]. PCP Dr. Rex Kras.  CM reviewed record, patient has had [place] services in the past with [facility]. Met with patient and son Lawson Radar at bedside. Discussed the recommendation for [facility] RN/PT and wound care , patient and son are agreeable to disposition plan. Offered choice, patient selected [facility] stated,she was pleased with previous experience. Referral placed and faxed to [facility] 336 [phone], fax confirmation received. Explained someone from [facility] will contact patient  at home at number and addressed verified in record to schedule a date and time for the initial visit. Patient and son verbalized understanding teach back done. CM Contact information provided to patient and son along with with [facility] contact information should any further questions or concerns should arise. Updated E. Tysinger on disposition plan. No further CM needs identified.

## 2014-07-01 NOTE — ED Provider Notes (Signed)
CSN: [id number]     Arrival date & time 07/01/14  1640 History   First MD Initiated Contact with Patient 07/01/14 1909     Chief Complaint  Patient presents with  . Fatigue  . Nausea   (Consider location/radiation/quality/duration/timing/severity/associated sxs/prior Treatment) HPI [name] is a 79 yo female presenting with report of general weakness and intermittent nausea and vomiting x several months.  She states she has been seen by her PCP for the nausea, vomting and weakness in the past and started on an acid reducer which seemed to improve her symptoms for a while but recently the intermittent nausea and vomiting has returned.  It does not occur every day but most recently she vomited once yesterday.  She endorses some diarrhea also but it resolved to day as well and her last bm was soft and formed.  She also reports a wound on her left buttock x 2 weeks.  She was seen by her PCP 1 week ago and instructed to use Silvadene cream without improvement.  She denies fevers, chills, chest pain, shortness of breath or dysuria.  Past Medical History  Diagnosis Date  . Cellulitis and abscess of unspecified site     a. h/o MSRA cellulitis of abdominal wall.  Marland Kitchen CAD (coronary artery disease)     a. DES to OM 01/2005. b. Rotational atherotomy/DES to prox LAD 02/2008. c. Cath 07/2013: stable, for med rx.  . Edema   . Warfarin anticoagulation     a. Followed by PCP.  Marland Kitchen GI bleed     a. EGD 01/2007: antral ulcers related to NSAID/aspirin use.  . Peripheral vascular disease   . Hyperthyroidism     a. Hx of intolerance to PTU.  Marland Kitchen Blood transfusion years ago    "heart problems" (09/26/2013)  . H/O hiatal hernia   . Umbilical hernia     unrepaired (11/24/11)  . Valvular heart disease     Echo 11/2011: mild-mod MR.  Marland Kitchen Hypertension     takes Amlodipine,Hydralazine,and Imdur daily  . Hyperlipidemia     takes Lovastatin daily  . SSS (sick sinus syndrome)     a. H/o AV node ablation 2008  secondary to difficult to control afib/flutter, with subsequent pacer.  . Atrial fibrillation with rapid ventricular response     a. H/o amiodarone thyroiditis. b. s/p AV node ablation 2008 with implantation of PPM 01/2007, upgraded to CRT-P 06/2007.;takes Coumadin daily  . GERD (gastroesophageal reflux disease)     takes Protonix daily  . Chronic diastolic CHF (congestive heart failure)     takes Lasix daily and Spironolactone   . Pacemaker     [employer]tronic  . History of gout   . Bruises easily   . Hemorrhoids   . Depression     takes Xanax daily  . Insomnia     takes Xanax if needed  . Heart murmur   . Chronic renal insufficiency     Cr ~2.3.  . Polycystic kidney disease     a. Per remote E-Chart records.  . Kidney stones   . Myocardial infarction     "I've had 2-3; last one was in /1996" (09/26/2013)  . Exertional shortness of breath   . Migraines     "last one was a pretty good while ago" (09/26/2013)  . Arthritis     "all over" (09/26/2013)  . Chronic lower back pain     deteriorating disc  . Anxiety   . Iron deficiency anemia  takes an Iron Pill daily  . Pneumonia    Past Surgical History  Procedure Laterality Date  . Esophagogastroduodenoscopy    . Tonsillectomy  ~ 1960  . Laminectomy and microdiscectomy lumbar spine  08/2003  . Cardioversion  10/2004; 02/2007    /E-chart  . Cholecystectomy    . Appendectomy  1954  . Vaginal hysterectomy  ?1981  . Av node ablation  02/2007    /E-chart  . Insert / replace / remove pacemaker  01/2007    initial placement  . Insert / replace / remove pacemaker  06/2007    upgraded/E-chart  . Av fistula placement, brachiocephalic Right 12/3974    "didn't work; my veins were too small"  . Joint replacement    . Total knee arthroplasty Left 01/2009  . Bladder and bowel  1970's    "tacked; damaged my intestines & had to remove some; done @ [facility]  . Cataract extraction w/ intraocular lens  implant, bilateral Bilateral   .  Bascilic vein transposition Right 06/21/2013    Procedure: [place];  Surgeon: Serafina Mitchell, MD;  Location: [place] OR;  Service: Vascular;  Laterality: Right;  . Esophagogastroduodenoscopy N/A 07/19/2013    Procedure: ESOPHAGOGASTRODUODENOSCOPY (EGD);  Surgeon: Lafayette Dragon, MD;  Location: [facility] ENDOSCOPY;  Service: Endoscopy;  Laterality: N/A;  . Balloon dilation N/A 07/19/2013    Procedure: BALLOON DILATION;  Surgeon: Lafayette Dragon, MD;  Location[facility] ENDOSCOPY;  Service: Endoscopy;  Laterality: N/A;  . Esophagogastroduodenoscopy N/A 07/20/2013    Procedure: ESOPHAGOGASTRODUODENOSCOPY (EGD);  Surgeon: Lafayette Dragon, MD;  Location: [facility] ENDOSCOPY;  Service: Endoscopy;  Laterality: N/A;  with removal of polyp in pylorus  . Bascilic vein transposition Right 09/20/2013    Procedure: 2ND STAGE RIGHT BASCILIC VEIN TRANSPOSITION; ULTRSOUND GUIDED;  Surgeon: Serafina Mitchell, MD;  Location: [facility] OR;  Service: Vascular;  Laterality: Right;  . Cardiac catheterization  2006; 2008; 2009; 01/13/2011  . Coronary angioplasty with stent placement  ~ 2007; ?    "had a total of 2 stents put in" 92/07/2014)  . Back surgery     Family History  Problem Relation Age of Onset  . Colon cancer Neg Hx   . Thyroid disease Neg Hx   . Stroke Brother   . Hypertension Brother   . Kidney disease Brother   . Kidney disease Brother   . Other Mother     Sepsis and perforated colon  . Kidney disease Mother   . Other Father     Motor vehicle accident  . Learning disabilities Son   . Kidney disease Maternal Aunt   . Kidney disease Maternal Grandmother    History  Substance Use Topics  . Smoking status: Former Smoker -- 1.00 packs/day for 50 years    Types: Cigarettes    Quit date: 07/13/1995  . Smokeless tobacco: Never Used     Comment: quit smoking around 1996  . Alcohol Use: No   OB History    No data available     Review of Systems  Constitutional: Positive for fatigue. Negative for fever  and chills.  HENT: Negative for sore throat.   Eyes: Negative for visual disturbance.  Respiratory: Negative for cough and shortness of breath.   Cardiovascular: Negative for chest pain and leg swelling.  Gastrointestinal: Positive for nausea, vomiting and diarrhea. Negative for abdominal pain.  Genitourinary: Negative for dysuria.  Musculoskeletal: Negative for myalgias.  Skin: Negative for rash.  Neurological: Negative  for weakness, numbness and headaches.      Allergies  Amiodarone and Propylthiouracil  Home Medications   Prior to Admission medications   Medication Sig Start Date End Date Taking? Authorizing Provider  ALPRAZolam Duanne Moron) 0.5 MG tablet Take 1 tablet (0.5 mg total) by mouth at bedtime as needed for anxiety or sleep. 05/12/14   Barton Dubois, MD  aspirin 81 MG tablet Take 81 mg by mouth daily.    Historical Provider, MD  camphor-menthol Timoteo Ace) lotion Apply topically as needed for itching. 05/12/14   Barton Dubois, MD  diphenoxylate-atropine (LOMOTIL) 2.5-0.025 MG per tablet Take 1-2 tablets by mouth every 8 (eight) hours as needed for diarrhea or loose stools.    Historical Provider, MD  ergocalciferol (VITAMIN D2) 50000 UNITS capsule Take 50,000 Units by mouth once a week.    Historical Provider, MD  Fluticasone-Salmeterol (ADVAIR DISKUS) 100-50 MCG/DOSE AEPB Inhale 1 puff into the lungs 2 (two) times daily. 01/27/14   Brand Males, MD  furosemide (LASIX) 80 MG tablet Take 1 tablet (80 mg total) by mouth 2 (two) times daily. 05/12/14   Barton Dubois, MD  hydrALAZINE (APRESOLINE) 25 MG tablet Take 1 tablet (25 mg total) by mouth 3 (three) times daily. 04/08/14   Josue Hector, MD  isosorbide mononitrate (IMDUR) 60 MG 24 hr tablet Take 120 mg by mouth daily. 04/08/14   Josue Hector, MD  levothyroxine (SYNTHROID, LEVOTHROID) 100 MCG tablet Take 100 mcg by mouth daily before breakfast.    Historical Provider, MD  loratadine (CLARITIN) 10 MG tablet Take 1 tablet (10 mg  total) by mouth daily. 05/12/14   Barton Dubois, MD  lovastatin (MEVACOR) 10 MG tablet Take 10 mg by mouth at bedtime.     Historical Provider, MD  magnesium oxide (MAG-OX) 400 MG tablet Take 400 mg by mouth at bedtime.     Historical Provider, MD  metolazone (ZAROXOLYN) 2.5 MG tablet Take 1 tablet (2.5 mg total) by mouth 2 (two) times a week. Monday and Friday 05/12/14   Barton Dubois, MD  metoprolol tartrate (LOPRESSOR) 25 MG tablet Take 75 mg by mouth 3 (three) times daily.    Historical Provider, MD  mometasone (NASONEX) 50 MCG/ACT nasal spray Place 2 sprays into the nose daily as needed (for congrestion).     Historical Provider, MD  nitroGLYCERIN (NITROSTAT) 0.4 MG SL tablet Place 0.4 mg under the tongue every 5 (five) minutes as needed for chest pain.    Historical Provider, MD  ondansetron (ZOFRAN) 4 MG tablet Take 1 tablet (4 mg total) by mouth every 8 (eight) hours as needed for nausea or vomiting. 10/28/13   Amy S Esterwood, PA-C  oxyCODONE-acetaminophen (PERCOCET/ROXICET) 5-325 MG per tablet Take 1 tablet by mouth every 6 (six) hours as needed for severe pain. For pain 09/20/13   Hulen Shouts Rhyne, PA-C  pantoprazole (PROTONIX) 40 MG tablet Take 40 mg by mouth 2 (two) times daily.     Historical Provider, MD  potassium chloride SA (K-DUR,KLOR-CON) 20 MEQ tablet 2 tablets (40 mEq) twice daily 11/22/13   Liliane Shi, PA-C  warfarin (COUMADIN) 2 MG tablet Take 2-3 mg by mouth daily at 6 PM. Takes 1 tablets (3mg ) on Mondays and Fridays.  Takes 1 tablet (2mg ) on all remaining days.    Historical Provider, MD   BP 134/48 mmHg  Pulse 61  Temp(Src) 97.3 F (36.3 C) (Oral)  Resp 18  SpO2 96% Physical Exam  Constitutional: She is oriented to person, place,  and time. She appears well-developed and well-nourished. No distress.  HENT:  Head: Normocephalic and atraumatic.  Mouth/Throat: Oropharynx is clear and moist. No oropharyngeal exudate.  Eyes: Conjunctivae are normal.  Neck: Neck supple. No  thyromegaly present.  Cardiovascular: Normal rate, regular rhythm and intact distal pulses.   Pulmonary/Chest: Effort normal and breath sounds normal. No respiratory distress. She has no wheezes. She has no rales. She exhibits no tenderness.  Abdominal: Soft. There is no tenderness. There is no rigidity, no rebound, no guarding, no CVA tenderness, no tenderness at McBurney's point and negative Murphy's sign.  Musculoskeletal: She exhibits no tenderness.  Lymphadenopathy:    She has no cervical adenopathy.  Neurological: She is alert and oriented to person, place, and time. She has normal strength. No cranial nerve deficit or sensory deficit. Coordination normal. GCS eye subscore is 4. GCS verbal subscore is 5. GCS motor subscore is 6.  Reflex Scores:      Patellar reflexes are 2+ on the right side and 2+ on the left side. Cranial nerves 2-12 intact  Skin: Skin is warm and dry. No rash noted. She is not diaphoretic.     Psychiatric: She has a normal mood and affect.  Nursing note and vitals reviewed.   ED Course  Procedures (including critical care time) Labs Review Labs Reviewed  CBC - Abnormal; Notable for the following:    WBC 3.9 (*)    RDW 15.6 (*)    Platelets 139 (*)    All other components within normal limits  COMPREHENSIVE METABOLIC PANEL - Abnormal; Notable for the following:    Glucose, Bld 105 (*)    BUN 65 (*)    Creatinine, Ser 3.39 (*)    Albumin 3.0 (*)    Total Bilirubin 1.5 (*)    GFR calc non Af Amer 12 (*)    GFR calc Af Amer 14 (*)    All other components within normal limits  URINALYSIS, ROUTINE W REFLEX MICROSCOPIC - Abnormal; Notable for the following:    APPearance CLOUDY (*)    Specific Gravity, Urine <1.005 (*)    Leukocytes, UA MODERATE (*)    All other components within normal limits  URINE MICROSCOPIC-ADD ON - Abnormal; Notable for the following:    Squamous Epithelial / LPF FEW (*)    All other components within normal limits  LIPASE, BLOOD    I-STAT TROPOININ, ED    Imaging Review Dg Chest 2 View  07/01/2014   CLINICAL DATA:  Nausea, vomiting, weight loss, fatigue, and weakness.  EXAM: CHEST  2 VIEW  COMPARISON:  05/07/2014  FINDINGS: Stable moderate enlargement of the cardiopericardial silhouette Stable position of pacer leads. Tortuous thoracic aorta.  Minimal scarring at the left lung base. No pleural effusion or edema.  IMPRESSION: 1. Stable moderate enlargement of the cardiopericardial silhouette. No acute findings.   Electronically Signed   By: Sherryl Barters M.D.   On: 07/01/2014 18:39     EKG Interpretation   Date/Time:  Tuesday July 01 2014 17:01:06 EST Ventricular Rate:  66 PR Interval:    QRS Duration: 98 QT Interval:  446 QTC Calculation: 467 R Axis:   -114 Text Interpretation:  Ventricular-paced rhythm Abnormal ECG Confirmed by  POLLINA  MD, CHRISTOPHER [phone]) on 07/01/2014 7:47:08 PM      MDM   Final diagnoses:  UTI (lower urinary tract infection)  Pressure ulcer   79 yo female with intermittent weakness x several months along with episodic vomiting and decreased  appetite.  Her symptoms have resolved today but she continues to have a left gluteal pressure ulcer.  Her CBC, CMP, Troponin and Lipase negative for acute abnormality but significant for elevated BUN and creatinine but at the pt's baseline.  Her EKG and CXR negative.  Her urine however has moderate leukocytes.  Wound care provided to wound care and arranged home health wound care with case mgmt.  Prescription of keflex provided for treatment UTI.  At this time there does not appear to be any evidence of an acute emergency medical condition and the patient appears stable for discharge with and instructed to follow-up with PCP. Pt and son verbalize understanding and are agreeable to discharge. Pt case discussed with Dr. Betsey Holiday who agrees with plan.    Filed Vitals:   07/01/14 2015 07/01/14 2030 07/01/14 2100 07/01/14 2112  BP: 121/50  136/46 147/65   Pulse: 60 62 63   Temp:    97.7 F (36.5 C)  TempSrc:    Oral  Resp: 18 14 14    SpO2: 98% 99% 99%    Meds given in ED:  Medications - No data to display  Discharge Medication List as of 07/01/2014  9:07 PM    START taking these medications   Details  cephALEXin (KEFLEX) 500 MG capsule Take 1 capsule (500 mg total) by mouth 2 (two) times daily., Starting 07/01/2014, Until Discontinued, Print    !! ondansetron (ZOFRAN) 4 MG tablet Take 1 tablet (4 mg total) by mouth every 6 (six) hours., Starting 07/01/2014, Until Discontinued, Print     !! - Potential duplicate medications found. Please discuss with provider.                Britt Bottom, NP 07/03/14 [place], MD 07/07/14 1041

## 2014-07-01 NOTE — ED Notes (Addendum)
Presents with nausea, vomiting, weight loss, fatigue and weakness that began 2 days ago. Pt has bedsore on left buttock. Reports inability to hold food down.  Fistula in right arm, has not started dialysis yet, denies pain.  Alert and oriented. Generalized fatigue.

## 2014-07-06 ENCOUNTER — Other Ambulatory Visit: Payer: Self-pay | Admitting: Cardiovascular Disease

## 2014-07-18 ENCOUNTER — Other Ambulatory Visit: Payer: Self-pay | Admitting: Cardiovascular Disease

## 2014-07-19 NOTE — Telephone Encounter (Signed)
Rx was sent to pharmacy electronically. 

## 2014-07-22 ENCOUNTER — Encounter (HOSPITAL_COMMUNITY): Payer: Self-pay

## 2014-07-22 ENCOUNTER — Inpatient Hospital Stay (HOSPITAL_COMMUNITY)
Admission: EM | Admit: 2014-07-22 | Discharge: 2014-07-29 | DRG: 377 | Disposition: A | Discharge status: Skilled Nursing Facility | Payer: Medicare Other | Attending: Internal Medicine | Admitting: Internal Medicine

## 2014-07-22 DIAGNOSIS — M1990 Unspecified osteoarthritis, unspecified site: Secondary | ICD-10-CM | POA: Diagnosis present

## 2014-07-22 DIAGNOSIS — Z90710 Acquired absence of both cervix and uterus: Secondary | ICD-10-CM | POA: Diagnosis not present

## 2014-07-22 DIAGNOSIS — I495 Sick sinus syndrome: Secondary | ICD-10-CM | POA: Diagnosis present

## 2014-07-22 DIAGNOSIS — Z7901 Long term (current) use of anticoagulants: Secondary | ICD-10-CM

## 2014-07-22 DIAGNOSIS — Z9842 Cataract extraction status, left eye: Secondary | ICD-10-CM

## 2014-07-22 DIAGNOSIS — I4891 Unspecified atrial fibrillation: Secondary | ICD-10-CM | POA: Diagnosis present

## 2014-07-22 DIAGNOSIS — I50812 Chronic right heart failure: Secondary | ICD-10-CM

## 2014-07-22 DIAGNOSIS — E0590 Thyrotoxicosis, unspecified without thyrotoxic crisis or storm: Secondary | ICD-10-CM | POA: Diagnosis present

## 2014-07-22 DIAGNOSIS — I071 Rheumatic tricuspid insufficiency: Secondary | ICD-10-CM | POA: Diagnosis present

## 2014-07-22 DIAGNOSIS — I5032 Chronic diastolic (congestive) heart failure: Secondary | ICD-10-CM | POA: Diagnosis present

## 2014-07-22 DIAGNOSIS — E876 Hypokalemia: Secondary | ICD-10-CM | POA: Diagnosis present

## 2014-07-22 DIAGNOSIS — R5383 Other fatigue: Secondary | ICD-10-CM | POA: Diagnosis present

## 2014-07-22 DIAGNOSIS — I739 Peripheral vascular disease, unspecified: Secondary | ICD-10-CM | POA: Diagnosis present

## 2014-07-22 DIAGNOSIS — R1310 Dysphagia, unspecified: Secondary | ICD-10-CM | POA: Diagnosis present

## 2014-07-22 DIAGNOSIS — M109 Gout, unspecified: Secondary | ICD-10-CM | POA: Diagnosis present

## 2014-07-22 DIAGNOSIS — K222 Esophageal obstruction: Secondary | ICD-10-CM | POA: Diagnosis present

## 2014-07-22 DIAGNOSIS — Z66 Do not resuscitate: Secondary | ICD-10-CM | POA: Diagnosis present

## 2014-07-22 DIAGNOSIS — M79606 Pain in leg, unspecified: Secondary | ICD-10-CM | POA: Diagnosis present

## 2014-07-22 DIAGNOSIS — L89323 Pressure ulcer of left buttock, stage 3: Secondary | ICD-10-CM | POA: Diagnosis present

## 2014-07-22 DIAGNOSIS — I252 Old myocardial infarction: Secondary | ICD-10-CM

## 2014-07-22 DIAGNOSIS — I272 Other secondary pulmonary hypertension: Secondary | ICD-10-CM | POA: Diagnosis present

## 2014-07-22 DIAGNOSIS — K224 Dyskinesia of esophagus: Secondary | ICD-10-CM | POA: Diagnosis present

## 2014-07-22 DIAGNOSIS — Z87891 Personal history of nicotine dependence: Secondary | ICD-10-CM

## 2014-07-22 DIAGNOSIS — Z955 Presence of coronary angioplasty implant and graft: Secondary | ICD-10-CM

## 2014-07-22 DIAGNOSIS — K7689 Other specified diseases of liver: Secondary | ICD-10-CM | POA: Diagnosis present

## 2014-07-22 DIAGNOSIS — N19 Unspecified kidney failure: Secondary | ICD-10-CM

## 2014-07-22 DIAGNOSIS — E038 Other specified hypothyroidism: Secondary | ICD-10-CM | POA: Insufficient documentation

## 2014-07-22 DIAGNOSIS — I10 Essential (primary) hypertension: Secondary | ICD-10-CM | POA: Diagnosis present

## 2014-07-22 DIAGNOSIS — Z452 Encounter for adjustment and management of vascular access device: Secondary | ICD-10-CM

## 2014-07-22 DIAGNOSIS — Z7982 Long term (current) use of aspirin: Secondary | ICD-10-CM | POA: Diagnosis not present

## 2014-07-22 DIAGNOSIS — E039 Hypothyroidism, unspecified: Secondary | ICD-10-CM | POA: Diagnosis present

## 2014-07-22 DIAGNOSIS — R17 Unspecified jaundice: Secondary | ICD-10-CM | POA: Diagnosis not present

## 2014-07-22 DIAGNOSIS — D689 Coagulation defect, unspecified: Secondary | ICD-10-CM | POA: Diagnosis present

## 2014-07-22 DIAGNOSIS — Z96652 Presence of left artificial knee joint: Secondary | ICD-10-CM | POA: Diagnosis present

## 2014-07-22 DIAGNOSIS — E785 Hyperlipidemia, unspecified: Secondary | ICD-10-CM | POA: Diagnosis present

## 2014-07-22 DIAGNOSIS — K7460 Unspecified cirrhosis of liver: Secondary | ICD-10-CM | POA: Diagnosis present

## 2014-07-22 DIAGNOSIS — K920 Hematemesis: Secondary | ICD-10-CM | POA: Diagnosis present

## 2014-07-22 DIAGNOSIS — I2510 Atherosclerotic heart disease of native coronary artery without angina pectoris: Secondary | ICD-10-CM | POA: Diagnosis present

## 2014-07-22 DIAGNOSIS — D509 Iron deficiency anemia, unspecified: Secondary | ICD-10-CM | POA: Diagnosis present

## 2014-07-22 DIAGNOSIS — Z8711 Personal history of peptic ulcer disease: Secondary | ICD-10-CM | POA: Diagnosis not present

## 2014-07-22 DIAGNOSIS — N186 End stage renal disease: Secondary | ICD-10-CM | POA: Diagnosis present

## 2014-07-22 DIAGNOSIS — K259 Gastric ulcer, unspecified as acute or chronic, without hemorrhage or perforation: Secondary | ICD-10-CM | POA: Diagnosis present

## 2014-07-22 DIAGNOSIS — R197 Diarrhea, unspecified: Secondary | ICD-10-CM | POA: Diagnosis present

## 2014-07-22 DIAGNOSIS — Z79899 Other long term (current) drug therapy: Secondary | ICD-10-CM | POA: Diagnosis not present

## 2014-07-22 DIAGNOSIS — D62 Acute posthemorrhagic anemia: Secondary | ICD-10-CM | POA: Diagnosis present

## 2014-07-22 DIAGNOSIS — G8929 Other chronic pain: Secondary | ICD-10-CM | POA: Diagnosis present

## 2014-07-22 DIAGNOSIS — Q613 Polycystic kidney, unspecified: Secondary | ICD-10-CM | POA: Diagnosis not present

## 2014-07-22 DIAGNOSIS — R7989 Other specified abnormal findings of blood chemistry: Secondary | ICD-10-CM | POA: Diagnosis present

## 2014-07-22 DIAGNOSIS — D61818 Other pancytopenia: Secondary | ICD-10-CM | POA: Diagnosis present

## 2014-07-22 DIAGNOSIS — K219 Gastro-esophageal reflux disease without esophagitis: Secondary | ICD-10-CM | POA: Diagnosis present

## 2014-07-22 DIAGNOSIS — K449 Diaphragmatic hernia without obstruction or gangrene: Secondary | ICD-10-CM | POA: Diagnosis present

## 2014-07-22 DIAGNOSIS — Z791 Long term (current) use of non-steroidal anti-inflammatories (NSAID): Secondary | ICD-10-CM | POA: Diagnosis not present

## 2014-07-22 DIAGNOSIS — Z9049 Acquired absence of other specified parts of digestive tract: Secondary | ICD-10-CM | POA: Diagnosis present

## 2014-07-22 DIAGNOSIS — N390 Urinary tract infection, site not specified: Secondary | ICD-10-CM | POA: Diagnosis present

## 2014-07-22 DIAGNOSIS — Z7951 Long term (current) use of inhaled steroids: Secondary | ICD-10-CM | POA: Diagnosis not present

## 2014-07-22 DIAGNOSIS — K921 Melena: Principal | ICD-10-CM

## 2014-07-22 DIAGNOSIS — N184 Chronic kidney disease, stage 4 (severe): Secondary | ICD-10-CM | POA: Diagnosis present

## 2014-07-22 DIAGNOSIS — Z9841 Cataract extraction status, right eye: Secondary | ICD-10-CM

## 2014-07-22 DIAGNOSIS — Z888 Allergy status to other drugs, medicaments and biological substances status: Secondary | ICD-10-CM | POA: Diagnosis not present

## 2014-07-22 DIAGNOSIS — K922 Gastrointestinal hemorrhage, unspecified: Secondary | ICD-10-CM | POA: Diagnosis present

## 2014-07-22 DIAGNOSIS — T45515A Adverse effect of anticoagulants, initial encounter: Secondary | ICD-10-CM | POA: Diagnosis present

## 2014-07-22 DIAGNOSIS — Z961 Presence of intraocular lens: Secondary | ICD-10-CM | POA: Diagnosis present

## 2014-07-22 DIAGNOSIS — IMO0002 Reserved for concepts with insufficient information to code with codable children: Secondary | ICD-10-CM

## 2014-07-22 DIAGNOSIS — R791 Abnormal coagulation profile: Secondary | ICD-10-CM

## 2014-07-22 DIAGNOSIS — I120 Hypertensive chronic kidney disease with stage 5 chronic kidney disease or end stage renal disease: Secondary | ICD-10-CM | POA: Diagnosis present

## 2014-07-22 DIAGNOSIS — T82594A Other mechanical complication of infusion catheter, initial encounter: Secondary | ICD-10-CM

## 2014-07-22 HISTORY — DX: Chronic kidney disease, unspecified: N18.9

## 2014-07-22 HISTORY — DX: Other specified anxiety disorders: F41.8

## 2014-07-22 LAB — CBC WITH DIFFERENTIAL/PLATELET
BASOS ABS: 0 10*3/uL (ref 0.0–0.1)
BASOS PCT: 0 % (ref 0–1)
Eosinophils Absolute: 0.1 10*3/uL (ref 0.0–0.7)
Eosinophils Relative: 2 % (ref 0–5)
HCT: 28.4 % — ABNORMAL LOW (ref 36.0–46.0)
Hemoglobin: 9.2 g/dL — ABNORMAL LOW (ref 12.0–15.0)
LYMPHS PCT: 12 % (ref 12–46)
Lymphs Abs: 0.6 10*3/uL — ABNORMAL LOW (ref 0.7–4.0)
MCH: 28.8 pg (ref 26.0–34.0)
MCHC: 32.4 g/dL (ref 30.0–36.0)
MCV: 88.8 fL (ref 78.0–100.0)
Monocytes Absolute: 0.4 10*3/uL (ref 0.1–1.0)
Monocytes Relative: 8 % (ref 3–12)
NEUTROS ABS: 3.5 10*3/uL (ref 1.7–7.7)
NEUTROS PCT: 78 % — AB (ref 43–77)
PLATELETS: 128 10*3/uL — AB (ref 150–400)
RBC: 3.2 MIL/uL — ABNORMAL LOW (ref 3.87–5.11)
RDW: 16.4 % — AB (ref 11.5–15.5)
WBC: 4.6 10*3/uL (ref 4.0–10.5)

## 2014-07-22 LAB — COMPREHENSIVE METABOLIC PANEL
ALBUMIN: 2.9 g/dL — AB (ref 3.5–5.2)
ALT: 8 U/L (ref 0–35)
AST: 21 U/L (ref 0–37)
Alkaline Phosphatase: 65 U/L (ref 39–117)
Anion gap: 16 — ABNORMAL HIGH (ref 5–15)
BILIRUBIN TOTAL: 1.8 mg/dL — AB (ref 0.3–1.2)
BUN: 102 mg/dL — ABNORMAL HIGH (ref 6–23)
CHLORIDE: 100 meq/L (ref 96–112)
CO2: 25 meq/L (ref 19–32)
Calcium: 9.2 mg/dL (ref 8.4–10.5)
Creatinine, Ser: 3.64 mg/dL — ABNORMAL HIGH (ref 0.50–1.10)
GFR calc Af Amer: 13 mL/min — ABNORMAL LOW (ref >90)
GFR calc non Af Amer: 11 mL/min — ABNORMAL LOW (ref >90)
Glucose, Bld: 95 mg/dL (ref 70–99)
Potassium: 4.7 mEq/L (ref 3.7–5.3)
SODIUM: 141 meq/L (ref 137–147)
Total Protein: 6.6 g/dL (ref 6.0–8.3)

## 2014-07-22 LAB — POC OCCULT BLOOD, ED: Fecal Occult Bld: POSITIVE — AB

## 2014-07-22 LAB — I-STAT TROPONIN, ED: TROPONIN I, POC: 0.02 ng/mL (ref 0.00–0.08)

## 2014-07-22 LAB — CBC
HCT: 23.2 % — ABNORMAL LOW (ref 36.0–46.0)
Hemoglobin: 7.9 g/dL — ABNORMAL LOW (ref 12.0–15.0)
MCH: 30.9 pg (ref 26.0–34.0)
MCHC: 34.1 g/dL (ref 30.0–36.0)
MCV: 90.6 fL (ref 78.0–100.0)
Platelets: 95 10*3/uL — ABNORMAL LOW (ref 150–400)
RBC: 2.56 MIL/uL — ABNORMAL LOW (ref 3.87–5.11)
RDW: 16.5 % — AB (ref 11.5–15.5)
WBC: 3.6 10*3/uL — ABNORMAL LOW (ref 4.0–10.5)

## 2014-07-22 LAB — URINALYSIS, ROUTINE W REFLEX MICROSCOPIC
Bilirubin Urine: NEGATIVE
GLUCOSE, UA: NEGATIVE mg/dL
HGB URINE DIPSTICK: NEGATIVE
Ketones, ur: NEGATIVE mg/dL
Nitrite: NEGATIVE
Protein, ur: NEGATIVE mg/dL
SPECIFIC GRAVITY, URINE: 1.008 (ref 1.005–1.030)
UROBILINOGEN UA: 0.2 mg/dL (ref 0.0–1.0)
pH: 7 (ref 5.0–8.0)

## 2014-07-22 LAB — URINE MICROSCOPIC-ADD ON

## 2014-07-22 LAB — LIPASE, BLOOD: LIPASE: 26 U/L (ref 11–59)

## 2014-07-22 LAB — PROTIME-INR
INR: 6.23 — AB (ref 0.00–1.49)
PROTHROMBIN TIME: 55.5 s — AB (ref 11.6–15.2)

## 2014-07-22 LAB — AMMONIA: AMMONIA: 103 umol/L — AB (ref 11–60)

## 2014-07-22 LAB — TSH: TSH: 0.365 u[IU]/mL (ref 0.350–4.500)

## 2014-07-22 MED ORDER — ONDANSETRON HCL 4 MG/2ML IJ SOLN
4.0000 mg | Freq: Four times a day (QID) | INTRAMUSCULAR | Status: DC | PRN
  Administered 2014-07-27: 4 mg via INTRAVENOUS
  Filled 2014-07-22: qty 2

## 2014-07-22 MED ORDER — TIOTROPIUM BROMIDE MONOHYDRATE 2.5 MCG/ACT IN AERS: 1.0000 | INHALATION_SPRAY | Freq: Two times a day (BID) | RESPIRATORY_TRACT | Status: DC

## 2014-07-22 MED ORDER — PANTOPRAZOLE SODIUM 40 MG IV SOLR
40.0000 mg | Freq: Two times a day (BID) | INTRAVENOUS | Status: DC
  Administered 2014-07-22 – 2014-07-25 (×5): 40 mg via INTRAVENOUS
  Filled 2014-07-22 (×10): qty 40

## 2014-07-22 MED ORDER — SODIUM CHLORIDE 0.9 % IV SOLN
80.0000 mg | Freq: Once | INTRAVENOUS | Status: AC
  Administered 2014-07-22: 80 mg via INTRAVENOUS
  Filled 2014-07-22: qty 80

## 2014-07-22 MED ORDER — KCL IN DEXTROSE-NACL 20-5-0.45 MEQ/L-%-% IV SOLN
INTRAVENOUS | Status: DC
  Administered 2014-07-22: 21:00:00 via INTRAVENOUS
  Filled 2014-07-22 (×2): qty 1000

## 2014-07-22 MED ORDER — FUROSEMIDE 10 MG/ML IJ SOLN
40.0000 mg | Freq: Two times a day (BID) | INTRAMUSCULAR | Status: DC
  Administered 2014-07-23: 40 mg via INTRAVENOUS
  Filled 2014-07-22 (×2): qty 4

## 2014-07-22 MED ORDER — SODIUM CHLORIDE 0.9 % IV SOLN: INTRAVENOUS | Status: DC

## 2014-07-22 MED ORDER — SILVER SULFADIAZINE 1 % EX CREA
1.0000 "application " | TOPICAL_CREAM | Freq: Every day | CUTANEOUS | Status: DC
  Administered 2014-07-23 – 2014-07-28 (×6): 1 via TOPICAL
  Filled 2014-07-22: qty 85

## 2014-07-22 MED ORDER — METOPROLOL TARTRATE 1 MG/ML IV SOLN
5.0000 mg | Freq: Four times a day (QID) | INTRAVENOUS | Status: DC
  Administered 2014-07-23 (×2): 5 mg via INTRAVENOUS
  Filled 2014-07-22 (×5): qty 5

## 2014-07-22 MED ORDER — SODIUM CHLORIDE 0.9 % IV SOLN: 10.0000 mL/h | Freq: Once | INTRAVENOUS | Status: DC

## 2014-07-22 MED ORDER — SODIUM CHLORIDE 0.9 % IV SOLN
8.0000 mg/h | INTRAVENOUS | Status: DC
  Administered 2014-07-22: 8 mg/h via INTRAVENOUS
  Filled 2014-07-22 (×2): qty 80

## 2014-07-22 MED ORDER — PHYTONADIONE 5 MG PO TABS
5.0000 mg | ORAL_TABLET | Freq: Once | ORAL | Status: AC
  Administered 2014-07-22: 5 mg via ORAL
  Filled 2014-07-22: qty 1

## 2014-07-22 MED ORDER — MOMETASONE FURO-FORMOTEROL FUM 100-5 MCG/ACT IN AERO
2.0000 | INHALATION_SPRAY | Freq: Two times a day (BID) | RESPIRATORY_TRACT | Status: DC
  Administered 2014-07-22 – 2014-07-29 (×13): 2 via RESPIRATORY_TRACT
  Filled 2014-07-22 (×2): qty 8.8

## 2014-07-22 MED ORDER — ALBUTEROL SULFATE (2.5 MG/3ML) 0.083% IN NEBU: 2.5000 mg | INHALATION_SOLUTION | Freq: Four times a day (QID) | RESPIRATORY_TRACT | Status: DC | PRN

## 2014-07-22 MED ORDER — SODIUM CHLORIDE 0.9 % IJ SOLN
3.0000 mL | Freq: Two times a day (BID) | INTRAMUSCULAR | Status: DC
  Administered 2014-07-22 – 2014-07-26 (×6): 3 mL via INTRAVENOUS

## 2014-07-22 MED ORDER — ONDANSETRON HCL 4 MG PO TABS
4.0000 mg | ORAL_TABLET | Freq: Four times a day (QID) | ORAL | Status: DC | PRN
  Administered 2014-07-23 – 2014-07-28 (×2): 4 mg via ORAL
  Filled 2014-07-22 (×2): qty 1

## 2014-07-22 MED ORDER — TIOTROPIUM BROMIDE MONOHYDRATE 18 MCG IN CAPS
18.0000 ug | ORAL_CAPSULE | Freq: Every day | RESPIRATORY_TRACT | Status: DC
  Administered 2014-07-22 – 2014-07-29 (×7): 18 ug via RESPIRATORY_TRACT
  Filled 2014-07-22 (×4): qty 5

## 2014-07-22 MED ORDER — LEVOTHYROXINE SODIUM 100 MCG IV SOLR
50.0000 ug | Freq: Every day | INTRAVENOUS | Status: DC
  Administered 2014-07-23: 50 ug via INTRAVENOUS
  Filled 2014-07-22: qty 5

## 2014-07-22 MED ORDER — MORPHINE SULFATE 2 MG/ML IJ SOLN
1.0000 mg | INTRAMUSCULAR | Status: DC | PRN
  Administered 2014-07-22 – 2014-07-28 (×5): 1 mg via INTRAVENOUS
  Filled 2014-07-22 (×5): qty 1

## 2014-07-22 NOTE — ED Provider Notes (Signed)
CSN: [id number]     Arrival date & time 07/22/14  1217 History   First MD Initiated Contact with Patient 07/22/14 1300     Chief Complaint  Patient presents with  . Emesis  . Diarrhea     (Consider location/radiation/quality/duration/timing/severity/associated sxs/prior Treatment) HPI The patient has had increasing general weakness. Her son reports that today she passed a large stool that was bloody in appearance. The patient also reports that she vomited as well and that had a dark appearance. The patient does have a history of stomach ulcers and is currently on Coumadin. She has not had any passing out episodes. She reports she has been very weak however in today it was a challenge just to do any of her usual activities. The symptoms have been worsening over the past 3 weeks with poor ability to eat. She also has been developing kidney failure however is not yet on dialysis. Past Medical History  Diagnosis Date  . Cellulitis and abscess of unspecified site     a. h/o MSRA cellulitis of abdominal wall.  Marland Kitchen CAD (coronary artery disease)     a. DES to OM 01/2005. b. Rotational atherotomy/DES to prox LAD 02/2008. c. Cath 07/2013: stable, for med rx.  . Edema   . Warfarin anticoagulation     a. Followed by PCP.  Marland Kitchen GI bleed     a. EGD 01/2007: antral ulcers related to NSAID/aspirin use.  . Peripheral vascular disease   . Hyperthyroidism     a. Hx of intolerance to PTU.  Marland Kitchen Blood transfusion years ago    "heart problems" (09/26/2013)  . H/O hiatal hernia   . Umbilical hernia     unrepaired (11/24/11)  . Valvular heart disease     Echo 11/2011: mild-mod MR.  Marland Kitchen Hypertension     takes Amlodipine,Hydralazine,and Imdur daily  . Hyperlipidemia     takes Lovastatin daily  . SSS (sick sinus syndrome)     a. H/o AV node ablation 2008 secondary to difficult to control afib/flutter, with subsequent pacer.  . Atrial fibrillation with rapid ventricular response     a. H/o amiodarone thyroiditis. b.  s/p AV node ablation 2008 with implantation of PPM 01/2007, upgraded to CRT-P 06/2007.;takes Coumadin daily  . GERD (gastroesophageal reflux disease)     takes Protonix daily  . Chronic diastolic CHF (congestive heart failure)     takes Lasix daily and Spironolactone   . Pacemaker     [employer]tronic  . History of gout   . Bruises easily   . Hemorrhoids   . Depression     takes Xanax daily  . Insomnia     takes Xanax if needed  . Heart murmur   . Chronic renal insufficiency     Cr ~2.3.  . Polycystic kidney disease     a. Per remote E-Chart records.  . Kidney stones   . Myocardial infarction     "I've had 2-3; last one was in /1996" (09/26/2013)  . Exertional shortness of breath   . Migraines     "last one was a pretty good while ago" (09/26/2013)  . Arthritis     "all over" (09/26/2013)  . Chronic lower back pain     deteriorating disc  . Anxiety   . Iron deficiency anemia     takes an Iron Pill daily  . Pneumonia    Past Surgical History  Procedure Laterality Date  . Esophagogastroduodenoscopy    . Tonsillectomy  ~ 1960  .  Laminectomy and microdiscectomy lumbar spine  08/2003  . Cardioversion  10/2004; 02/2007    /E-chart  . Cholecystectomy    . Appendectomy  1954  . Vaginal hysterectomy  ?1981  . Av node ablation  02/2007    /E-chart  . Insert / replace / remove pacemaker  01/2007    initial placement  . Insert / replace / remove pacemaker  06/2007    upgraded/E-chart  . Av fistula placement, brachiocephalic Right 01/8340    "didn't work; my veins were too small"  . Joint replacement    . Total knee arthroplasty Left 01/2009  . Bladder and bowel  1970's    "tacked; damaged my intestines & had to remove some; done @ [facility]  . Cataract extraction w/ intraocular lens  implant, bilateral Bilateral   . Bascilic vein transposition Right 06/21/2013    Procedure: DeBary;  Surgeon: Serafina Mitchell, MD;  Location: [place] OR;  Service: Vascular;   Laterality: Right;  . Esophagogastroduodenoscopy N/A 07/19/2013    Procedure: ESOPHAGOGASTRODUODENOSCOPY (EGD);  Surgeon: Lafayette Dragon, MD;  Location: [facility] ENDOSCOPY;  Service: Endoscopy;  Laterality: N/A;  . Balloon dilation N/A 07/19/2013    Procedure: BALLOON DILATION;  Surgeon: Lafayette Dragon, MD;  Location: [facility] ENDOSCOPY;  Service: Endoscopy;  Laterality: N/A;  . Esophagogastroduodenoscopy N/A 07/20/2013    Procedure: ESOPHAGOGASTRODUODENOSCOPY (EGD);  Surgeon: Lafayette Dragon, MD;  Location: [facility] ENDOSCOPY;  Service: Endoscopy;  Laterality: N/A;  with removal of polyp in pylorus  . Bascilic vein transposition Right 09/20/2013    Procedure: 2ND STAGE RIGHT BASCILIC VEIN TRANSPOSITION; ULTRSOUND GUIDED;  Surgeon: Serafina Mitchell, MD;  Location: [facility] OR;  Service: Vascular;  Laterality: Right;  . Cardiac catheterization  2006; 2008; 2009; 01/13/2011  . Coronary angioplasty with stent placement  ~ 2007; ?    "had a total of 2 stents put in" 92/07/2014)  . Back surgery     Family History  Problem Relation Age of Onset  . Colon cancer Neg Hx   . Thyroid disease Neg Hx   . Stroke Brother   . Hypertension Brother   . Kidney disease Brother   . Kidney disease Brother   . Other Mother     Sepsis and perforated colon  . Kidney disease Mother   . Other Father     Motor vehicle accident  . Learning disabilities Son   . Kidney disease Maternal Aunt   . Kidney disease Maternal Grandmother    History  Substance Use Topics  . Smoking status: Former Smoker -- 1.00 packs/day for 50 years    Types: Cigarettes    Quit date: 07/13/1995  . Smokeless tobacco: Never Used     Comment: quit smoking around 1996  . Alcohol Use: No   OB History    No data available     Review of Systems 10 Systems reviewed and are negative for acute change except as noted in the HPI.   Allergies  Amiodarone and Propylthiouracil  Home Medications   Prior to Admission medications   Medication Sig Start Date End Date  Taking? Authorizing Provider  ALPRAZolam Duanne Moron) 0.5 MG tablet Take 1 tablet (0.5 mg total) by mouth at bedtime as needed for anxiety or sleep. 05/12/14   Barton Dubois, MD  aspirin 81 MG tablet Take 81 mg by mouth daily.    Historical Provider, MD  camphor-menthol Timoteo Ace) lotion Apply topically as needed for itching. 05/12/14   Barton Dubois, MD  cephALEXin (KEFLEX) 500 MG capsule Take 1 capsule (500 mg total) by mouth 2 (two) times daily. 07/01/14   Britt Bottom, NP  dexlansoprazole (DEXILANT) 60 MG capsule Take 60 mg by mouth daily.    Historical Provider, MD  diphenoxylate-atropine (LOMOTIL) 2.5-0.025 MG per tablet Take 1-2 tablets by mouth every 8 (eight) hours as needed for diarrhea or loose stools.    Historical Provider, MD  ergocalciferol (VITAMIN D2) 50000 UNITS capsule Take 50,000 Units by mouth once a week. Take on saturdays    Historical Provider, MD  Fluticasone-Salmeterol (ADVAIR DISKUS) 100-50 MCG/DOSE AEPB Inhale 1 puff into the lungs 2 (two) times daily. 01/27/14   Brand Males, MD  furosemide (LASIX) 80 MG tablet Take 1 tablet (80 mg total) by mouth 2 (two) times daily. 05/12/14   Barton Dubois, MD  hydrALAZINE (APRESOLINE) 25 MG tablet TAKE 1 TABLET BY MOUTH 3 TIMES A DAY 07/19/14   Josue Hector, MD  isosorbide mononitrate (IMDUR) 60 MG 24 hr tablet TAKE 2 TABLETS BY MOUTH EVERY DAY 07/08/14   Josue Hector, MD  levothyroxine (SYNTHROID, LEVOTHROID) 100 MCG tablet Take 100 mcg by mouth daily before breakfast.    Historical Provider, MD  loratadine (CLARITIN) 10 MG tablet Take 1 tablet (10 mg total) by mouth daily. 05/12/14   Barton Dubois, MD  lovastatin (MEVACOR) 10 MG tablet Take 10 mg by mouth at bedtime.     Historical Provider, MD  magnesium oxide (MAG-OX) 400 MG tablet Take 400 mg by mouth at bedtime.     Historical Provider, MD  metolazone (ZAROXOLYN) 2.5 MG tablet Take 1 tablet (2.5 mg total) by mouth 2 (two) times a week. Monday and Friday 05/12/14   Barton Dubois,  MD  metoprolol tartrate (LOPRESSOR) 25 MG tablet Take 75 mg by mouth 3 (three) times daily.    Historical Provider, MD  mometasone (NASONEX) 50 MCG/ACT nasal spray Place 2 sprays into the nose daily as needed (for congrestion).     Historical Provider, MD  nitroGLYCERIN (NITROSTAT) 0.4 MG SL tablet Place 0.4 mg under the tongue every 5 (five) minutes as needed for chest pain.    Historical Provider, MD  ondansetron (ZOFRAN) 4 MG tablet Take 1 tablet (4 mg total) by mouth every 8 (eight) hours as needed for nausea or vomiting. 10/28/13   Amy S Esterwood, PA-C  ondansetron (ZOFRAN) 4 MG tablet Take 1 tablet (4 mg total) by mouth every 6 (six) hours. 07/01/14   Britt Bottom, NP  oxyCODONE-acetaminophen (PERCOCET/ROXICET) 5-325 MG per tablet Take 1 tablet by mouth every 6 (six) hours as needed for severe pain. For pain 09/20/13   Hulen Shouts Rhyne, PA-C  pantoprazole (PROTONIX) 40 MG tablet Take 40 mg by mouth 2 (two) times daily.     Historical Provider, MD  potassium chloride SA (K-DUR,KLOR-CON) 20 MEQ tablet 2 tablets (40 mEq) twice daily 11/22/13   Liliane Shi, PA-C  silver sulfADIAZINE (SILVADENE) 1 % cream Apply 1 application topically daily. For irration on hip per family member    Historical Provider, MD  warfarin (COUMADIN) 2 MG tablet Take 2-3 mg by mouth daily at 6 PM. Takes 1 tablets (3mg ) on Mondays and Fridays.  Takes 1 tablet (2mg ) on all remaining days.    Historical Provider, MD   BP 123/57 mmHg  Pulse 59  Temp(Src) 97.7 F (36.5 C) (Oral)  Resp 16  SpO2 99% Physical Exam  Constitutional: She is oriented to person, place, and time.  The patient  is ill and pale in appearance. Her mental status is clear. She is not in any acute respiratory distress.  HENT:  Head: Normocephalic and atraumatic.  Eyes: EOM are normal. Pupils are equal, round, and reactive to light.  Neck: Neck supple.  Cardiovascular: Normal rate, regular rhythm, normal heart sounds and intact distal pulses.    Pulmonary/Chest: Effort normal and breath sounds normal. No respiratory distress.  Abdominal: Soft. Bowel sounds are normal. She exhibits distension. There is no tenderness. There is no rebound.  Genitourinary:  Rectal examination shows dark reddish black stool. There is no active bleeding.  Musculoskeletal: She exhibits edema. She exhibits no tenderness.  Neurological: She is alert and oriented to person, place, and time. Coordination normal.  Skin: Skin is warm and dry.  The patient has many bruises of varying ages. Her skin has a sallow appearance.  Psychiatric: She has a normal mood and affect.    ED Course  Procedures (including critical care time) Labs Review Labs Reviewed  CBC WITH DIFFERENTIAL - Abnormal; Notable for the following:    RBC 3.20 (*)    Hemoglobin 9.2 (*)    HCT 28.4 (*)    RDW 16.4 (*)    Platelets 128 (*)    Neutrophils Relative % 78 (*)    Lymphs Abs 0.6 (*)    All other components within normal limits  COMPREHENSIVE METABOLIC PANEL - Abnormal; Notable for the following:    BUN 102 (*)    Creatinine, Ser 3.64 (*)    Albumin 2.9 (*)    Total Bilirubin 1.8 (*)    GFR calc non Af Amer 11 (*)    GFR calc Af Amer 13 (*)    Anion gap 16 (*)    All other components within normal limits  URINALYSIS, ROUTINE W REFLEX MICROSCOPIC - Abnormal; Notable for the following:    APPearance HAZY (*)    Leukocytes, UA MODERATE (*)    All other components within normal limits  PROTIME-INR - Abnormal; Notable for the following:    Prothrombin Time 55.5 (*)    INR 6.23 (*)    All other components within normal limits  URINE MICROSCOPIC-ADD ON - Abnormal; Notable for the following:    Squamous Epithelial / LPF FEW (*)    Bacteria, UA MANY (*)    All other components within normal limits  POC OCCULT BLOOD, ED - Abnormal; Notable for the following:    Fecal Occult Bld POSITIVE (*)    All other components within normal limits  LIPASE, BLOOD  AMMONIA  I-STAT  TROPOININ, ED  TYPE AND SCREEN    Imaging Review No results found.   EKG Interpretation None     CRITICAL CARE Performed by: Charlesetta Shanks   Total critical care time: 40  Critical care time was exclusive of separately billable procedures and treating other patients.  Critical care was necessary to treat or prevent imminent or life-threatening deterioration.  Critical care was time spent personally by me on the following activities: development of treatment plan with patient and/or surrogate as well as nursing, discussions with consultants, evaluation of patient's response to treatment, examination of patient, obtaining history from patient or surrogate, ordering and performing treatments and interventions, ordering and review of laboratory studies, ordering and review of radiographic studies, pulse oximetry and re-evaluation of patient's condition. MDM   Final diagnoses:  Gastrointestinal hemorrhage with melena  Over-anticoagulated  Renal failure   The patient presents on above with a complex medical history. At  this point in time she is over anticoagulated on Coumadin with an INR of 6 and GI bleed present. The patient is treated with FFP and vitamin K. She will be admitted for further treatment and monitoring. She has multiple severe medical comorbidities.    Charlesetta Shanks, MD 07/22/14 [phone]

## 2014-07-22 NOTE — Plan of Care (Signed)
Problem: Consults Goal: GI Bleeding Patient Education See Patient Education Module for education specifics. Outcome: Completed/Met Date Met:  07/22/14 Goal: Skin Care Protocol Initiated - if Braden Score 18 or less If consults are not indicated, leave blank or document N/A Outcome: Completed/Met Date Met:  07/22/14 Goal: Nutrition Consult-if indicated Outcome: Not Applicable Date Met:  31/54/00 Goal: Diabetes Guidelines if Diabetic/Glucose > 140 If diabetic or lab glucose is > 140 mg/dl - Initiate Diabetes/Hyperglycemia Guidelines & Document Interventions  Outcome: Not Applicable Date Met:  86/76/19  Problem: Phase I Progression Outcomes Goal: Pain controlled with appropriate interventions Outcome: Completed/Met Date Met:  07/22/14 Goal: OOB as tolerated unless otherwise ordered Outcome: Progressing Goal: Voiding-avoid urinary catheter unless indicated Outcome: Completed/Met Date Met:  07/22/14 Goal: Hemodynamically stable Outcome: Completed/Met Date Met:  07/22/14

## 2014-07-22 NOTE — Progress Notes (Signed)
Pt arrived from ED-VSS-first unit of plasma infusing, pt experiencing pain in legs 4/10. Notified elink and CMT of arrival. Paged Dr. Wendee Beavers to clarify future lab order after plasma and asked for pain medication for leg pain. Will continue to monitor.

## 2014-07-22 NOTE — Progress Notes (Signed)
Started second unit of plasma. Patient tolerating well. Will continue to monitor.

## 2014-07-22 NOTE — ED Notes (Signed)
Critical lab results given to Dr. Ermalinda Memos

## 2014-07-22 NOTE — H&P (Signed)
[facility] History and Physical  [name]:[id number] DOB: October 23, 1934 DOA: 07/22/2014  Referring physician: Dr. Johnney Killian PCP: Tamsen Roers, MD   Chief Complaint: Bloody BM x 2 and hematemesis x 1  HPI: [name] is a 79 y.o. female  With history of atrial fibrillation on Coumadin who presents to the ED after family found her to have 2 bloody bowel movements and one bout of hematemesis. The patient denies any fevers or chills. No pain associated with bloody bowel movements. The patient denies any travel outside of the country. While there in the emergency department patient was found to have supratherapeutic INR level.  We were subsequently consulted for further evaluation recommendations. While in the ED patient was ordered to be given 2 units of FFP and vitamin K by mouth.   Family states the patient has history of esophageal dilatation as well as history of peptic ulcer disease. They are not sure of the GI physician was. Reportedly the patient has had poor oral intake within the last several weeks. Family also endorses weakness   Review of Systems:  Constitutional:  No weight loss, night sweats, Fevers, chills,  + fatigue.  HEENT:  No headaches, Difficulty swallowing,Tooth/dental problems,Sore throat,  No sneezing, itching, ear ache, nasal congestion, post nasal drip,  Cardio-vascular:  No chest pain, Orthopnea, PND,  anasarca, dizziness, palpitations  GI:  No heartburn, indigestion, abdominal pain, nausea, + vomiting x 1, diarrhea, change in bowel habits, loss of appetite  Resp:  No shortness of breath with exertion or at rest. No excess mucus, no productive cough, No non-productive cough, No coughing up of blood.No change in color of mucus.No wheezing.No chest wall deformity  Skin:  no rashes GU:  no dysuria, change in color of urine, no urgency or frequency. No flank pain.  Musculoskeletal:  No joint pain or swelling. No decreased range of motion. No  back pain.  Psych:  No change in mood or affect. No depression or anxiety. No memory loss.   Past Medical History  Diagnosis Date  . Cellulitis and abscess of unspecified site     a. h/o MSRA cellulitis of abdominal wall.  Marland Kitchen CAD (coronary artery disease)     a. DES to OM 01/2005. b. Rotational atherotomy/DES to prox LAD 02/2008. c. Cath 07/2013: stable, for med rx.  . Edema   . Warfarin anticoagulation     a. Followed by PCP.  Marland Kitchen GI bleed     a. EGD 01/2007: antral ulcers related to NSAID/aspirin use.  . Peripheral vascular disease   . Hyperthyroidism     a. Hx of intolerance to PTU.  Marland Kitchen Blood transfusion years ago    "heart problems" (09/26/2013)  . H/O hiatal hernia   . Umbilical hernia     unrepaired (11/24/11)  . Valvular heart disease     Echo 11/2011: mild-mod MR.  Marland Kitchen Hypertension     takes Amlodipine,Hydralazine,and Imdur daily  . Hyperlipidemia     takes Lovastatin daily  . SSS (sick sinus syndrome)     a. H/o AV node ablation 2008 secondary to difficult to control afib/flutter, with subsequent pacer.  . Atrial fibrillation with rapid ventricular response     a. H/o amiodarone thyroiditis. b. s/p AV node ablation 2008 with implantation of PPM 01/2007, upgraded to CRT-P 06/2007.;takes Coumadin daily  . GERD (gastroesophageal reflux disease)     takes Protonix daily  . Chronic diastolic CHF (congestive heart failure)     takes Lasix daily and Spironolactone   .  Pacemaker     [employer]  . History of gout   . Bruises easily   . Hemorrhoids   . Depression     takes Xanax daily  . Insomnia     takes Xanax if needed  . Heart murmur   . Chronic renal insufficiency     Cr ~2.3.  . Polycystic kidney disease     a. Per remote E-Chart records.  . Kidney stones   . Myocardial infarction     "I've had 2-3; last one was in /1996" (09/26/2013)  . Exertional shortness of breath   . Migraines     "last one was a pretty good while ago" (09/26/2013)  . Arthritis     "all over"  (09/26/2013)  . Chronic lower back pain     deteriorating disc  . Anxiety   . Iron deficiency anemia     takes an Iron Pill daily  . Pneumonia    Past Surgical History  Procedure Laterality Date  . Esophagogastroduodenoscopy    . Tonsillectomy  ~ 1960  . Laminectomy and microdiscectomy lumbar spine  08/2003  . Cardioversion  10/2004; 02/2007    /E-chart  . Cholecystectomy    . Appendectomy  1954  . Vaginal hysterectomy  ?1981  . Av node ablation  02/2007    /E-chart  . Insert / replace / remove pacemaker  01/2007    initial placement  . Insert / replace / remove pacemaker  06/2007    upgraded/E-chart  . Av fistula placement, brachiocephalic Right 02/3709    "didn't work; my veins were too small"  . Joint replacement    . Total knee arthroplasty Left 01/2009  . Bladder and bowel  1970's    "tacked; damaged my intestines & had to remove some; done @ [facility]  . Cataract extraction w/ intraocular lens  implant, bilateral Bilateral   . Bascilic vein transposition Right 06/21/2013    Procedure: Abrams;  Surgeon: Serafina Mitchell, MD;  Location: [facility] OR;  Service: Vascular;  Laterality: Right;  . Esophagogastroduodenoscopy N/A 07/19/2013    Procedure: ESOPHAGOGASTRODUODENOSCOPY (EGD);  Surgeon: Lafayette Dragon, MD;  Location: [facility] ENDOSCOPY;  Service: Endoscopy;  Laterality: N/A;  . Balloon dilation N/A 07/19/2013    Procedure: BALLOON DILATION;  Surgeon: Lafayette Dragon, MD;  Location: [facility] ENDOSCOPY;  Service: Endoscopy;  Laterality: N/A;  . Esophagogastroduodenoscopy N/A 07/20/2013    Procedure: ESOPHAGOGASTRODUODENOSCOPY (EGD);  Surgeon: Lafayette Dragon, MD;  Location: [facility] ENDOSCOPY;  Service: Endoscopy;  Laterality: N/A;  with removal of polyp in pylorus  . Bascilic vein transposition Right 09/20/2013    Procedure: 2ND STAGE RIGHT BASCILIC VEIN TRANSPOSITION; ULTRSOUND GUIDED;  Surgeon: Serafina Mitchell, MD;  Location: [facility] OR;  Service: Vascular;  Laterality: Right;  .  Cardiac catheterization  2006; 2008; 2009; 01/13/2011  . Coronary angioplasty with stent placement  ~ 2007; ?    "had a total of 2 stents put in" 92/07/2014)  . Back surgery     Social History:  reports that she quit smoking about 19 years ago. Her smoking use included Cigarettes. She has a 50 pack-year smoking history. She has never used smokeless tobacco. She reports that she does not drink alcohol or use illicit drugs.  Allergies  Allergen Reactions  . Amiodarone Hives  . Propylthiouracil Rash    Family History  Problem Relation Age of Onset  . Colon cancer Neg Hx   . Thyroid disease Neg Hx   .  Stroke Brother   . Hypertension Brother   . Kidney disease Brother   . Kidney disease Brother   . Other Mother     Sepsis and perforated colon  . Kidney disease Mother   . Other Father     Motor vehicle accident  . Learning disabilities Son   . Kidney disease Maternal Aunt   . Kidney disease Maternal Grandmother      Prior to Admission medications   Medication Sig Start Date End Date Taking? Authorizing Provider  ALPRAZolam Duanne Moron) 0.5 MG tablet Take 1 tablet (0.5 mg total) by mouth at bedtime as needed for anxiety or sleep. 05/12/14  Yes Barton Dubois, MD  camphor-menthol [facility]) lotion Apply topically as needed for itching. 05/12/14  Yes Barton Dubois, MD  colchicine 0.6 MG tablet Take 0.6 mg by mouth 2 (two) times daily as needed (Gout).  05/20/14  Yes Historical Provider, MD  ergocalciferol (VITAMIN D2) 50000 UNITS capsule Take 50,000 Units by mouth once a week. Take on saturdays   Yes Historical Provider, MD  furosemide (LASIX) 80 MG tablet Take 1 tablet (80 mg total) by mouth 2 (two) times daily. 05/12/14  Yes Barton Dubois, MD  hydrALAZINE (APRESOLINE) 25 MG tablet TAKE 1 TABLET BY MOUTH 3 TIMES A DAY 07/19/14  Yes Josue Hector, MD  isosorbide mononitrate (IMDUR) 60 MG 24 hr tablet TAKE 2 TABLETS BY MOUTH EVERY DAY 07/08/14  Yes Josue Hector, MD  levothyroxine (SYNTHROID,  LEVOTHROID) 100 MCG tablet Take 100 mcg by mouth daily before breakfast.   Yes Historical Provider, MD  loratadine (CLARITIN) 10 MG tablet Take 1 tablet (10 mg total) by mouth daily. 05/12/14  Yes Barton Dubois, MD  lovastatin (MEVACOR) 10 MG tablet Take 10 mg by mouth at bedtime.    Yes Historical Provider, MD  magnesium oxide (MAG-OX) 400 MG tablet Take 400 mg by mouth at bedtime.    Yes Historical Provider, MD  metolazone (ZAROXOLYN) 2.5 MG tablet Take 1 tablet (2.5 mg total) by mouth 2 (two) times a week. Monday and Friday 05/12/14  Yes Barton Dubois, MD  miconazole (MONISTAT 7) 2 % vaginal cream Place 1 Applicatorful vaginally 2 (two) times daily.   Yes Historical Provider, MD  ondansetron (ZOFRAN) 4 MG tablet Take 1 tablet (4 mg total) by mouth every 6 (six) hours. 07/01/14  Yes Britt Bottom, NP  oxyCODONE-acetaminophen (PERCOCET/ROXICET) 5-325 MG per tablet Take 1 tablet by mouth every 6 (six) hours as needed for severe pain. For pain 09/20/13  Yes Samantha J Rhyne, PA-C  pantoprazole (PROTONIX) 40 MG tablet Take 40 mg by mouth 2 (two) times daily.    Yes Historical Provider, MD  promethazine (PHENERGAN) 25 MG tablet Take 25 mg by mouth every 8 (eight) hours as needed for nausea or vomiting.  07/17/14  Yes Historical Provider, MD  triamcinolone cream (KENALOG) 0.1 % Apply 1 application topically 2 (two) times daily.  07/17/14  Yes Historical Provider, MD  warfarin (COUMADIN) 2 MG tablet Take 2-3 mg by mouth daily at 6 PM. Takes 1 tablets (3mg ) on Mondays and Fridays.  Takes 1 tablet (2mg ) on all remaining days.   Yes Historical Provider, MD  aspirin 81 MG tablet Take 81 mg by mouth daily.    Historical Provider, MD  cephALEXin (KEFLEX) 500 MG capsule Take 1 capsule (500 mg total) by mouth 2 (two) times daily. 07/01/14   Britt Bottom, NP  dexlansoprazole (DEXILANT) 60 MG capsule Take 60 mg by mouth daily.  Historical Provider, MD  diphenoxylate-atropine (LOMOTIL) 2.5-0.025 MG per tablet  Take 1-2 tablets by mouth every 8 (eight) hours as needed for diarrhea or loose stools.    Historical Provider, MD  Fluticasone-Salmeterol (ADVAIR DISKUS) 100-50 MCG/DOSE AEPB Inhale 1 puff into the lungs 2 (two) times daily. Patient not taking: Reported on 07/22/2014 01/27/14   Brand Males, MD  metoprolol tartrate (LOPRESSOR) 25 MG tablet Take 75 mg by mouth 3 (three) times daily.    Historical Provider, MD  mometasone (NASONEX) 50 MCG/ACT nasal spray Place 2 sprays into the nose daily as needed (for congrestion).     Historical Provider, MD  nitroGLYCERIN (NITROSTAT) 0.4 MG SL tablet Place 0.4 mg under the tongue every 5 (five) minutes as needed for chest pain.    Historical Provider, MD  potassium chloride SA (K-DUR,KLOR-CON) 20 MEQ tablet 2 tablets (40 mEq) twice daily 11/22/13   Liliane Shi, PA-C  silver sulfADIAZINE (SILVADENE) 1 % cream Apply 1 application topically daily. For irration on hip per family member    Historical Provider, MD  Tiotropium Bromide Monohydrate (SPIRIVA RESPIMAT) 2.5 MCG/ACT AERS Inhale 1 puff into the lungs 2 (two) times daily.    Historical Provider, MD   Physical Exam: Filed Vitals:   07/22/14 1600 07/22/14 1630 07/22/14 1726 07/22/14 1751  BP: 119/46 106/46 104/57 109/53  Pulse: 59 62  63  Temp: 97.5 F (36.4 C)  97.8 F (36.6 C) 97.5 F (36.4 C)  TempSrc: Oral  Oral Oral  Resp: 15 15  17   Height:   5\' 7"  (1.702 m)   SpO2: 100% 100%  100%    Wt Readings from Last 3 Encounters:  05/19/14 89.812 kg (198 lb)  05/12/14 90.765 kg (200 lb 1.6 oz)  04/25/14 94.167 kg (207 lb 9.6 oz)    General:  Appears calm and comfortable Eyes: PERRL, normal lids, irises & conjunctiva ENT: grossly normal hearing, lips & tongue Neck: no LAD, masses or thyromegaly Cardiovascular: RRR, no m/r/g.  Telemetry: SR, no arrhythmias  Respiratory: CTA bilaterally, no w/r/r. Normal respiratory effort. Abdomen: soft, nt, nd Skin: no rash on limited exam Musculoskeletal:  grossly normal tone BUE/BLE Psychiatric: grossly normal mood and affect, speech fluent and appropriate Neurologic: no facial asymmetry          Labs on Admission:  Basic Metabolic Panel:  Recent Labs Lab 07/22/14 1247  NA 141  K 4.7  CL 100  CO2 25  GLUCOSE 95  BUN 102*  CREATININE 3.64*  CALCIUM 9.2   Liver Function Tests:  Recent Labs Lab 07/22/14 1247  AST 21  ALT 8  ALKPHOS 65  BILITOT 1.8*  PROT 6.6  ALBUMIN 2.9*    Recent Labs Lab 07/22/14 1247  LIPASE 26   No results for input(s): AMMONIA in the last 168 hours. CBC:  Recent Labs Lab 07/22/14 1247  WBC 4.6  NEUTROABS 3.5  HGB 9.2*  HCT 28.4*  MCV 88.8  PLT 128*   Cardiac Enzymes: No results for input(s): CKTOTAL, CKMB, CKMBINDEX, TROPONINI in the last 168 hours.  BNP (last 3 results)  Recent Labs  10/28/13 1450 05/07/14 1659 05/10/14 0508  PROBNP 1368.0* 11688.0* 12366.0*   CBG: No results for input(s): GLUCAP in the last 168 hours.  Radiological Exams on Admission: No results found.  EKG: Independently reviewed. Ventricular paced rhythm, no st elevations or depressions  Assessment/Plan Principal Problem:   GI bleed - Most likely secondary to supratherapeutic INR and patient with history of peptic  ulcer disease. - Protonix IV twice a day - Hold Coumadin - Patient  administered 2 units of FFP and vitamin K while in the ED, will monitor INR levels daily  Active Problems:   Hypothyroidism - Will place on IV synthroid    Essential hypertension - Continue B blocker    GERD - Place on protonix IV BID    A-fib - coumadin will be placed on hold - B blocker will be continued IV form     End stage renal disease - Continue Lasix 40 mg IV BID to equal home regimen - Monitor potassium levels    HLD (hyperlipidemia) - Hold statin as will make patient npo    Supratherapeutic INR - We'll hold Coumadin and monitor INRs daily  Chronic leg pain - Since patient will be NPO  will place order for morphine but may have to de escalate that should patient become confused  Code Status: DNR DVT Prophylaxis:Currently patient supratherapeutic and has h/o leg wounds Family Communication: discussed with family at bedside Disposition Plan: Pending improvement in condition and cessation of bleeding  Time spent: > 55 minutes  Velvet Bathe [facility] Pager [phone]

## 2014-07-22 NOTE — ED Notes (Signed)
Per EMS, pt c/o N/V/D x3 weeks. Seen by PCP and referred to GI specialist but did not follow up. Per son, patient had a dark stool, possibly blood in stool, last night and vomited once this AM at 0600 that was described as "dark that looked like blood". Hx of stomach ulcers and esophageal stretching.

## 2014-07-23 ENCOUNTER — Encounter (HOSPITAL_COMMUNITY): Payer: Self-pay | Admitting: Physician Assistant

## 2014-07-23 DIAGNOSIS — K224 Dyskinesia of esophagus: Secondary | ICD-10-CM | POA: Diagnosis present

## 2014-07-23 DIAGNOSIS — K920 Hematemesis: Secondary | ICD-10-CM

## 2014-07-23 DIAGNOSIS — N184 Chronic kidney disease, stage 4 (severe): Secondary | ICD-10-CM

## 2014-07-23 DIAGNOSIS — I071 Rheumatic tricuspid insufficiency: Secondary | ICD-10-CM | POA: Diagnosis present

## 2014-07-23 DIAGNOSIS — R110 Nausea: Secondary | ICD-10-CM

## 2014-07-23 DIAGNOSIS — D62 Acute posthemorrhagic anemia: Secondary | ICD-10-CM | POA: Diagnosis present

## 2014-07-23 DIAGNOSIS — I482 Chronic atrial fibrillation: Secondary | ICD-10-CM

## 2014-07-23 DIAGNOSIS — K921 Melena: Principal | ICD-10-CM

## 2014-07-23 DIAGNOSIS — I50812 Chronic right heart failure: Secondary | ICD-10-CM

## 2014-07-23 DIAGNOSIS — D61818 Other pancytopenia: Secondary | ICD-10-CM | POA: Diagnosis present

## 2014-07-23 DIAGNOSIS — I272 Pulmonary hypertension, unspecified: Secondary | ICD-10-CM | POA: Diagnosis present

## 2014-07-23 DIAGNOSIS — R7989 Other specified abnormal findings of blood chemistry: Secondary | ICD-10-CM | POA: Diagnosis present

## 2014-07-23 LAB — CBC
HCT: 22.5 % — ABNORMAL LOW (ref 36.0–46.0)
HCT: 22.8 % — ABNORMAL LOW (ref 36.0–46.0)
HEMOGLOBIN: 7.3 g/dL — AB (ref 12.0–15.0)
Hemoglobin: 7.5 g/dL — ABNORMAL LOW (ref 12.0–15.0)
MCH: 29.1 pg (ref 26.0–34.0)
MCH: 29.2 pg (ref 26.0–34.0)
MCHC: 32.4 g/dL (ref 30.0–36.0)
MCHC: 32.9 g/dL (ref 30.0–36.0)
MCV: 89.6 fL (ref 78.0–100.0)
MCV: 88.7 fL (ref 78.0–100.0)
PLATELETS: 94 10*3/uL — AB (ref 150–400)
Platelets: 90 10*3/uL — ABNORMAL LOW (ref 150–400)
RBC: 2.51 MIL/uL — ABNORMAL LOW (ref 3.87–5.11)
RBC: 2.57 MIL/uL — ABNORMAL LOW (ref 3.87–5.11)
RDW: 16.9 % — ABNORMAL HIGH (ref 11.5–15.5)
RDW: 16.9 % — ABNORMAL HIGH (ref 11.5–15.5)
WBC: 2.9 10*3/uL — ABNORMAL LOW (ref 4.0–10.5)
WBC: 2.9 10*3/uL — AB (ref 4.0–10.5)

## 2014-07-23 LAB — PREPARE FRESH FROZEN PLASMA
Unit division: 0
Unit division: 0

## 2014-07-23 LAB — MRSA PCR SCREENING: MRSA by PCR: NEGATIVE

## 2014-07-23 LAB — BASIC METABOLIC PANEL
Anion gap: 17 — ABNORMAL HIGH (ref 5–15)
BUN: 97 mg/dL — ABNORMAL HIGH (ref 6–23)
CO2: 24 mEq/L (ref 19–32)
Calcium: 9 mg/dL (ref 8.4–10.5)
Chloride: 101 mEq/L (ref 96–112)
Creatinine, Ser: 3.55 mg/dL — ABNORMAL HIGH (ref 0.50–1.10)
GFR calc Af Amer: 13 mL/min — ABNORMAL LOW (ref >90)
GFR calc non Af Amer: 11 mL/min — ABNORMAL LOW (ref >90)
Glucose, Bld: 89 mg/dL (ref 70–99)
Potassium: 4.3 mEq/L (ref 3.7–5.3)
SODIUM: 142 meq/L (ref 137–147)

## 2014-07-23 LAB — PREPARE RBC (CROSSMATCH)

## 2014-07-23 LAB — PROTIME-INR
INR: 3.29 — ABNORMAL HIGH (ref 0.00–1.49)
PROTHROMBIN TIME: 33.7 s — AB (ref 11.6–15.2)

## 2014-07-23 MED ORDER — NITROGLYCERIN 0.4 MG SL SUBL
0.4000 mg | SUBLINGUAL_TABLET | SUBLINGUAL | Status: DC | PRN
Start: 2014-07-23 — End: 2014-07-29
  Administered 2014-07-23: 0.4 mg via SUBLINGUAL
  Filled 2014-07-23: qty 1

## 2014-07-23 MED ORDER — SODIUM CHLORIDE 0.9 % IV SOLN: Freq: Once | INTRAVENOUS | Status: AC

## 2014-07-23 MED ORDER — CHLORHEXIDINE GLUCONATE 0.12 % MT SOLN
15.0000 mL | Freq: Two times a day (BID) | OROMUCOSAL | Status: DC
  Administered 2014-07-23 – 2014-07-25 (×3): 15 mL via OROMUCOSAL
  Filled 2014-07-23 (×9): qty 15

## 2014-07-23 MED ORDER — SODIUM CHLORIDE 0.9 % IV SOLN
Freq: Once | INTRAVENOUS | Status: AC
  Administered 2014-07-23: 13:00:00 via INTRAVENOUS

## 2014-07-23 MED ORDER — LEVOTHYROXINE SODIUM 100 MCG PO TABS
100.0000 ug | ORAL_TABLET | Freq: Every day | ORAL | Status: DC
  Administered 2014-07-24 – 2014-07-29 (×6): 100 ug via ORAL
  Filled 2014-07-23 (×9): qty 1

## 2014-07-23 MED ORDER — CETYLPYRIDINIUM CHLORIDE 0.05 % MT LIQD
7.0000 mL | Freq: Two times a day (BID) | OROMUCOSAL | Status: DC
  Administered 2014-07-23 – 2014-07-25 (×4): 7 mL via OROMUCOSAL

## 2014-07-23 MED ORDER — SODIUM CHLORIDE 0.9 % IV SOLN
INTRAVENOUS | Status: DC
  Administered 2014-07-24 – 2014-07-26 (×3): via INTRAVENOUS

## 2014-07-23 NOTE — Consult Note (Signed)
[facility] Consult: 12:59 PM 07/23/2014  LOS: 1 day    Referring Provider: Dorothy Puffer  Primary Care Physician:  Tamsen Roers, MD Primary Gastroenterologist:  Dr. Ardis Hughs    Reason for Consultation:  CG emesis and dark stool in setting of INR of 12.    HPI: [name] is a 79 y.o. female.  On chronic Coumadin and low-dose ASA for A fib.  Hypothyroidism. CKD due to polycystic kidneys. 2008 AV node ablation for SSS.  Diastolic heart failure. Iron deficiency anemia not currently on oral iron supplements..   Hepatic cysts per 10/2013 CT.  Variable thrombocytopenia dating to 2008.  In 2008 she had gastric ulcers. She takes twice a day Protonix chronically.  For at least 2 weeks patient has been having nausea and non-bloody emesis, mostly associated with meals. Last week Dr. Rex Kras added Zofran when necessary but it does not seem to be helping.  He also prescribed a 5 day course of Keflex, she's not sure of its indication; it could've been skin infection, UTI or worry about a lung infection. She has become progressively more fatigued and weak but not having increase in dyspnea. Over the weekend the emesis turned coffee ground and stools became tarry and black. She thinks she's lost about 10 pounds in the last few weeks. She has not been bleeding from her nose or mouth. However over the weekend she did take a fall after losing her balance onto a carpeted floor, impacting her right side.  This has caused bruising on her torso. There was no head trauma.  She also denies syncope leading up to the fall. Patient was brought to the ED yesterday where the INR was 6.23, protime 55.5. Treated with FFP x 2, vitamin K. INR is now 3.2.  Hgb baseline is .  In ED was 9.2, dropped to 7.3 at 0200 today. 2 units PRBCs ordered. Platelets also  low and dropping: 129 to 90.  Ammonia level is elevated at 103.  Transaminases and alkaline phosphatase are normal but the total bilirubin is 1.8.  Her WN has jumped to 102 from 65 3 weeks ago.  This proportion is up more than her interval creatinine increase.   Patient says that several weeks ago her Coumadin was held for what sounds like at least a week because her INR was too high., but it has been resumed for at least the last month.   ENDOSCOPIC STUDIES: 07/2013 EGD for dysphagia: tortuous and spastic esophagus c/w presbyesophagus.  No esoph stricture but was empirically dilated  With 19 mm [place].   10/2011  Esophagram: Mildly diminished primary esophageal peristalsis.  No fixed esophageal narrowing or stricture.  Small hiatal hernia. Mild gastroesophageal reflux..    04/2007 EGD:  Healed gastric ulcers.   01/2007 EGD: several small antral ulcers seen. CLO testing negative.  Pt was taking daily NSAIDs And on Coumadin   08/2009  Colonoscopy No polyps.    Past Medical History  Diagnosis Date  . Cellulitis and abscess of unspecified site     a. h/o MSRA cellulitis of  abdominal wall.  Marland Kitchen CAD (coronary artery disease)     a. DES to OM 01/2005. b. Rotational atherotomy/DES to prox LAD 02/2008. c. Cath 07/2013: stable, for med rx.  . Edema   . Warfarin anticoagulation     a. Followed by PCP.  Marland Kitchen GI bleed     a. EGD 01/2007: antral ulcers related to NSAID/aspirin use.  . Peripheral vascular disease   . Hyperthyroidism     a. Hx of intolerance to PTU.  Marland Kitchen Blood transfusion years ago    "heart problems" (09/26/2013)  . H/O hiatal hernia   . Umbilical hernia     unrepaired (11/24/11)  . Valvular heart disease     Echo 11/2011: mild-mod MR.  Marland Kitchen Hypertension     takes Amlodipine,Hydralazine,and Imdur daily  . Hyperlipidemia     takes Lovastatin daily  . SSS (sick sinus syndrome)     a. H/o AV node ablation 2008 secondary to difficult to control afib/flutter, with subsequent pacer.  .  Atrial fibrillation with rapid ventricular response     a. H/o amiodarone thyroiditis. b. s/p AV node ablation 2008 with implantation of PPM 01/2007, upgraded to CRT-P 06/2007.;takes Coumadin daily  . GERD (gastroesophageal reflux disease)     takes Protonix daily  . Chronic diastolic CHF (congestive heart failure)     takes Lasix daily and Spironolactone   . Pacemaker     [employer]  . History of gout   . Hemorrhoids   . Depression with anxiety     takes Xanax daily  . Insomnia     takes Xanax if needed  . Heart murmur   . Chronic renal insufficiency     Cr ~2.3.  . Polycystic kidney disease     a. Per remote E-Chart records.  . Kidney stones   . Myocardial infarction     "I've had 2-3; last one was in /1996" (09/26/2013)  . Migraines     "last one was a pretty good while ago" (09/26/2013)  . Arthritis     "all over" (09/26/2013)  . Chronic lower back pain     deteriorating disc  . Iron deficiency anemia     takes an Iron Pill daily  . Pneumonia     Past Surgical History  Procedure Laterality Date  . Esophagogastroduodenoscopy    . Tonsillectomy  ~ 1960  . Laminectomy and microdiscectomy lumbar spine  08/2003  . Cardioversion  10/2004; 02/2007    /E-chart  . Cholecystectomy    . Appendectomy  1954  . Vaginal hysterectomy  ?1981  . Av node ablation  02/2007    /E-chart  . Insert / replace / remove pacemaker  01/2007    initial placement  . Insert / replace / remove pacemaker  06/2007    upgraded/E-chart  . Av fistula placement, brachiocephalic Right 0/0938    "didn't work; my veins were too small"  . Joint replacement    . Total knee arthroplasty Left 01/2009  . Bladder and bowel  1970's    "tacked; damaged my intestines & had to remove some; done @ [facility]  . Cataract extraction w/ intraocular lens  implant, bilateral Bilateral   . Bascilic vein transposition Right 06/21/2013    Procedure: [place];  Surgeon: Serafina Mitchell, MD;   Location: [facility] OR;  Service: Vascular;  Laterality: Right;  . Esophagogastroduodenoscopy N/A 07/19/2013    Procedure: ESOPHAGOGASTRODUODENOSCOPY (EGD);  Surgeon: Lafayette Dragon, MD;  Location: [facility] ENDOSCOPY;  Service: Endoscopy;  Laterality: N/A;  . Balloon dilation N/A 07/19/2013    Procedure: BALLOON DILATION;  Surgeon: Lafayette Dragon, MD;  Location: [facility] ENDOSCOPY;  Service: Endoscopy;  Laterality: N/A;  . Esophagogastroduodenoscopy N/A 07/20/2013    Procedure: ESOPHAGOGASTRODUODENOSCOPY (EGD);  Surgeon: Lafayette Dragon, MD;  Location: [facility] ENDOSCOPY;  Service: Endoscopy;  Laterality: N/A;  with removal of polyp in pylorus  . Bascilic vein transposition Right 09/20/2013    Procedure: 2ND STAGE RIGHT BASCILIC VEIN TRANSPOSITION; ULTRSOUND GUIDED;  Surgeon: Serafina Mitchell, MD;  Location: [facility] OR;  Service: Vascular;  Laterality: Right;  . Cardiac catheterization  2006; 2008; 2009; 01/13/2011  . Coronary angioplasty with stent placement  ~ 2007; ?    "had a total of 2 stents put in" 92/07/2014)  . Back surgery      Prior to Admission medications   Medication Sig Start Date End Date Taking? Authorizing Provider  ALPRAZolam Duanne Moron) 0.5 MG tablet Take 1 tablet (0.5 mg total) by mouth at bedtime as needed for anxiety or sleep. 05/12/14  Yes Barton Dubois, MD  aspirin 81 MG tablet Take 81 mg by mouth daily.   Yes Historical Provider, MD  camphor-menthol Timoteo Ace) lotion Apply topically as needed for itching. 05/12/14  Yes Barton Dubois, MD  colchicine 0.6 MG tablet Take 0.6 mg by mouth 2 (two) times daily as needed (Gout).  05/20/14  Yes Historical Provider, MD  ergocalciferol (VITAMIN D2) 50000 UNITS capsule Take 50,000 Units by mouth every Saturday. Take on saturdays   Yes Historical Provider, MD  furosemide (LASIX) 80 MG tablet Take 1 tablet (80 mg total) by mouth 2 (two) times daily. 05/12/14  Yes Barton Dubois, MD  hydrALAZINE (APRESOLINE) 25 MG tablet TAKE 1 TABLET BY MOUTH 3 TIMES A DAY 07/19/14  Yes Josue Hector,  MD  isosorbide mononitrate (IMDUR) 60 MG 24 hr tablet TAKE 2 TABLETS BY MOUTH EVERY DAY 07/08/14  Yes Josue Hector, MD  levothyroxine (SYNTHROID, LEVOTHROID) 100 MCG tablet Take 100 mcg by mouth daily before breakfast.   Yes Historical Provider, MD  loratadine (CLARITIN) 10 MG tablet Take 1 tablet (10 mg total) by mouth daily. 05/12/14  Yes Barton Dubois, MD  lovastatin (MEVACOR) 10 MG tablet Take 10 mg by mouth at bedtime.    Yes Historical Provider, MD  magnesium oxide (MAG-OX) 400 MG tablet Take 400 mg by mouth at bedtime.    Yes Historical Provider, MD  metolazone (ZAROXOLYN) 2.5 MG tablet Take 1 tablet (2.5 mg total) by mouth 2 (two) times a week. Monday and Friday 05/12/14  Yes Barton Dubois, MD  nitroGLYCERIN (NITROSTAT) 0.4 MG SL tablet Place 0.4 mg under the tongue every 5 (five) minutes as needed for chest pain.   Yes Historical Provider, MD  ondansetron (ZOFRAN) 4 MG tablet Take 1 tablet (4 mg total) by mouth every 6 (six) hours. 07/01/14  Yes Britt Bottom, NP  oxyCODONE-acetaminophen (PERCOCET/ROXICET) 5-325 MG per tablet Take 1 tablet by mouth every 6 (six) hours as needed for severe pain. For pain 09/20/13  Yes Samantha J Rhyne, PA-C  pantoprazole (PROTONIX) 40 MG tablet Take 40 mg by mouth 2 (two) times daily.    Yes Historical Provider, MD  potassium chloride SA (K-DUR,KLOR-CON) 20 MEQ tablet Take 40 mEq by mouth 2 (two) times daily.  11/22/13  Yes Scott T Kathlen Mody, PA-C  promethazine (PHENERGAN) 25 MG tablet Take 25 mg by mouth every 8 (eight) hours as needed for nausea or vomiting.  07/17/14  Yes Historical Provider, MD  silver sulfADIAZINE (SILVADENE) 1 % cream Apply 1 application topically daily. For irration on hip per family member   Yes Historical Provider, MD  triamcinolone cream (KENALOG) 0.1 % Apply 1 application topically 2 (two) times daily as needed (for rash).  07/17/14  Yes Historical Provider, MD  warfarin (COUMADIN) 2 MG tablet Take 2-3 mg by mouth daily at 6 PM. Takes  1 tablets (3mg ) on Mondays and Fridays.  Takes 1 tablet (2mg ) on all remaining days.   Yes Historical Provider, MD  cephALEXin (KEFLEX) 500 MG capsule Take 1 capsule (500 mg total) by mouth 2 (two) times daily. Patient not taking: Reported on 07/23/2014 07/01/14   Britt Bottom, NP  Fluticasone-Salmeterol (ADVAIR DISKUS) 100-50 MCG/DOSE AEPB Inhale 1 puff into the lungs 2 (two) times daily. Patient not taking: Reported on 07/22/2014 01/27/14   Brand Males, MD    Scheduled Meds: . sodium chloride   Intravenous STAT  . sodium chloride   Intravenous Once  . sodium chloride   Intravenous Once  . antiseptic oral rinse  7 mL Mouth Rinse q12n4p  . chlorhexidine  15 mL Mouth Rinse BID  . levothyroxine  50 mcg Intravenous Daily  . metoprolol  5 mg Intravenous 4 times per day  . mometasone-formoterol  2 puff Inhalation BID  . pantoprazole (PROTONIX) IV  40 mg Intravenous Q12H  . silver sulfADIAZINE  1 application Topical Daily  . sodium chloride  3 mL Intravenous Q12H  . tiotropium  18 mcg Inhalation Daily   Infusions: . sodium chloride     PRN Meds: albuterol, morphine injection, ondansetron **OR** ondansetron (ZOFRAN) IV   Allergies as of 07/22/2014 - Review Complete 07/22/2014  Allergen Reaction Noted  . Amiodarone Hives   . Propylthiouracil Rash 11/23/2011    Family History  Problem Relation Age of Onset  . Colon cancer Neg Hx   . Thyroid disease Neg Hx   . Stroke Brother   . Hypertension Brother   . Kidney disease Brother   . Kidney disease Brother   . Other Mother     Sepsis and perforated colon  . Kidney disease Mother   . Other Father     Motor vehicle accident  . Learning disabilities Son   . Kidney disease Maternal Aunt   . Kidney disease Maternal Grandmother     History   Social History  . Marital Status: Widowed    Spouse Name: N/A    Number of Children: N/A  . Years of Education: N/A   Occupational History  . Not on file.   Social History Main  Topics  . Smoking status: Former Smoker -- 1.00 packs/day for 50 years    Types: Cigarettes    Quit date: 07/13/1995  . Smokeless tobacco: Never Used     Comment: quit smoking around 1996  . Alcohol Use: No  . Drug Use: No  . Sexual Activity: No   Other Topics Concern  . Not on file   Social History Narrative   Lives in [place].    REVIEW OF SYSTEMS: Constitutional:  Chronically weak but this is has increased in the last several days.  Her baseline is ambulatory only with a walker or a cane and this has gotten much more limited in the past week or so. ENT:  No nose bleeds Pulm:  Stable dyspnea. She is sedentary so does not get significant DOE. CV:  No palpitations, occasional LE edema.  GU:  No hematuria, no  frequency GI:  Denies dysphagia. More details in the HPI  Heme:  Per HPI    Transfusions:  None recently per her recall. Neuro:  No headaches, no peripheral tingling or numbness.  No vision changes. Derm:  No itching, no rash or sores.  Endocrine:  No sweats or chills.  No polyuria or dysuria Immunization:  Did not inquire Travel:  None     PHYSICAL EXAM: Vital signs in last 24 hours: Filed Vitals:   07/23/14 1201  BP:   Pulse:   Temp: 97.4 F (36.3 C)  Resp:    Wt Readings from Last 3 Encounters:  07/23/14 190 lb 4.1 oz (86.3 kg)  05/19/14 198 lb (89.812 kg)  05/12/14 200 lb 1.6 oz (90.765 kg)    General: Obese, frail, elderly white female who looks ill. She is comfortable Head:  No signs of trauma. No swelling, no facial asymmetry  Eyes:  No icterus, no conjunctival pallor. EOMI. Ears:  Minor hearing loss  Nose:  No congestion, no discharge, no dried blood in the nares Mouth:  Full set of dentures in place. Oral mucosa is dry without lesions or exudates Neck:  No mass, no TMG, no JVD. Lungs:  Crackles bilaterally halfway up. Dyspnea with speech. No cough Heart: RRR, soft 1 to 2/6 systolic murmur. S1/S2 audible Abdomen:  Soft, non-tender, nondistended  but obese. There is a baseball sized area of fullness in the right mid to upper quadrant. Does not appear to be a hernia..   Rectal: Burgundy, glistening, soft stool: Looks like a proximal colon or upper GI source.. External hemorrhoids visible.  No masses.  There is a dressing covering a sacral wound, this was not removed but per RN it is a stage III ulcer  Musc/Skeltl: Arthritic and postsurgical changes in both knees.    Extremities:  Extensive bruising in the right upper arm, shoulder and the right and left torso. Venous stasis hyperpigmented changes in the lower extremities.  Neurologic:   tremor in the upper extremities. No asterixis. Appropriate, follows commands. Very difficult for her to move about in the bed without assistance. Moves all 4 limbs. Limb strength was not tested. Skin:   multiple hematomas and purpura on the trunk, arms and legs. Tattoos:   none Nodes:   no cervical adenopathy.   Psych:   relaxed, affect blunted.  Intake/Output from previous day: 12/08 0701 - 12/09 0700 In: 847.8 [I.V.:616.8; Blood:231] Out: 1150 [Urine:1150] Intake/Output this shift: Total I/O In: 200 [I.V.:200] Out: 100 [Urine:100]  LAB RESULTS:  Recent Labs  07/22/14 2209 07/23/14 0220 07/23/14 1025  WBC 3.6* 2.9* 2.9*  HGB 7.9* 7.3* 7.5*  HCT 23.2* 22.5* 22.8*  PLT 95* 90* 94*   BMET Lab Results  Component Value Date   NA 142 07/23/2014   NA 141 07/22/2014   NA 137 07/01/2014   K 4.3 07/23/2014   K 4.7 07/22/2014   K 4.2 07/01/2014   CL 101 07/23/2014   CL 100 07/22/2014   CL 98 07/01/2014   CO2 24 07/23/2014   CO2 25 07/22/2014   CO2 24 07/01/2014   GLUCOSE 89 07/23/2014   GLUCOSE 95 07/22/2014   GLUCOSE 105* 07/01/2014   BUN 97* 07/23/2014   BUN 102* 07/22/2014   BUN 65* 07/01/2014   CREATININE 3.55* 07/23/2014   CREATININE 3.64* 07/22/2014   CREATININE 3.39* 07/01/2014   CALCIUM 9.0 07/23/2014   CALCIUM 9.2 07/22/2014   CALCIUM 9.4 07/01/2014   LFT  Recent  Labs  07/22/14 1247  PROT 6.6  ALBUMIN 2.9*  AST 21  ALT 8  ALKPHOS 65  BILITOT 1.8*   PT/INR Lab Results  Component Value Date   INR 3.29* 07/23/2014   INR 6.23* 07/22/2014   INR 2.86* 05/12/2014   Hepatitis Panel No results for input(s): HEPBSAG, HCVAB, HEPAIGM, HEPBIGM in the last 72 hours. C-Diff No components found for: CDIFF Lipase     Component Value Date/Time   LIPASE 26 07/22/2014 1247    Drugs of Abuse  No results found for: LABOPIA, COCAINSCRNUR, LABBENZ, AMPHETMU, THCU, LABBARB   RADIOLOGY STUDIES: No results found.    IMPRESSION:   *  Upper GI bleed. In setting of iatrogenic coagulopathy.  Despite twice daily Protonix, she developed nausea vomiting and eventually hematemesis. Rule out ulcer disease which she had in 2008; though with PPI therapy this is less likely. Rule out Mallory-Weiss tear. No signs of varices or portal hypertension on EGD of 07/2013.    *  ABL anemia. Baseline of chronic anemia, not taking iron at home. MCV normal. Transfusions with 2 units packed red cells ordered late this morning.  *  Iatrogenic coagulopathy. INR improved but still supratherapeutic following FFP and vitamin K.  She had a course of Keflex last week and this may have been the inciting factor for coagulopathy  Takes Coumadin for history of atrial fib.  *  Thrombocytopenia  *  Elevated ammonia level, essentially normal LFTs, no history of cirrhosis.  Patient not currently confused. Last abdominal imaging was noncontrasted CT of March 2015 where there were benign hepatic cysts.   *  History of dysphagia with presbyesophagus. Status post empiric dilation of non-strictured esophagus one year ago. Dysphagia is not currently an issue   PLAN:     *  If stable, we will need to undergo upper endoscopy, once the coags are corrected and anemia is improved.  Leave Protonix drip in place for now. For now she can stay on a diet of clear liquids as tolerated.  Will start with  sips of clears   Azucena Freed  07/23/2014, 12:59 PM Pager: [phone]  GI ATTENDING  History, laboratories, x-rays, previous endoscopy reports reviewed. Patient personally seen and examined. Agree with H&P as outlined above. The patient presents with upper GI bleeding in the face of over anticoagulation. She does have a history of peptic ulcer disease, but has been on regular PPI therapy. She has been having nausea and vomiting. Potential etiologies include Mallory-Weiss tear, retching gastropathy, recurrent ulcer disease, or AVMs. Agree with plans to correct coagulopathy, transfuse for symptomatic anemia, and continue PPI. Antiemetics as needed. Plans for upper endoscopy tomorrow. Last colonoscopy 2011. Discussed with patient.  Docia Chuck. Geri Seminole., M.D. [facility] Division of Gastroenterology

## 2014-07-23 NOTE — Progress Notes (Signed)
UR Completed.  [name] Jane [phone]  

## 2014-07-23 NOTE — Progress Notes (Signed)
Physician notified: Otis Dials, NP At: 1413  Regarding: Pt requesting NTG SL for "throat pain". States she takes it at home for this and it goes away, stating NOT chest pain. Awaiting return response.   Returned Response at: 1416  Order(s): NTG SL tabs ordered for eso spasms

## 2014-07-23 NOTE — Consult Note (Signed)
WOC wound consult note Reason for Consult: evaluation and treatment of the LE wounds Wound type: pt has various scabbed areas over the LEs, more noted on the LLE. Nothing open at the current time.  Measurement: scattered small areas none with significant size Wound bed: scabbed crust Drainage (amount, consistency, odor) none Periwound: intact, senile purpura upper and lower extremities  Dressing procedure/placement/frequency: No topical care recommended at this time.   Discussed POC with patient and bedside nurse.  Re consult if needed, will not follow at this time. Thanks  [name], [place] [phone])

## 2014-07-23 NOTE — Progress Notes (Signed)
[facility]  [name] WJX:[id number] DOB: 1935/06/05 DOA: 07/22/2014 PCP: Tamsen Roers, MD  Brief narrative: 79 year old female patient with atrial fibrillation on Coumadin, known CAD, known multiple GI disorders including prior benign gastric polyp, GI bleed secondary to gastric ulcers after NSAIDs use, as well as esophageal dysmotility, hypertension, and chronic kidney disease who presented to the ER after her family found out she had had 2 bloody bowel movements and an episode of coffee-ground emesis. Patient denied abdominal pain.  In the ER the patient was found to have a supratherapeutic INR level of 6.23. She was also thrombocytopenic with a platelet count of 128,000 but she does have a degree of chronic thrombocytopenia with readings anywhere between 113,000 and 139,000 in the past few months. In the ER she was given vitamin K and 2 units of FFP.  HPI/Subjective: Awake and complaining of generalized fatigue and malaise. Continues to have dark stools since admission. Denied bloody stools or emesis.  Assessment/Plan:  Acute GI bleed / history of Gastric ulcer (2008) -Likely influenced by supratherapeutic INR therefore continue to work on correcting INR -Continue Protonix IV every 12 hours -GI consulted -BUN elevated reflective of ongoing upper GI blood loss  Supratherapeutic INR / Long term current use of anticoagulant therapy -Was given vitamin K and 2 units of FFP at time of admission -Unfortunately INR has only decreased to 3.29 and her hemoglobin continues to decrease and she continues with dark stools -Give additional 2 units FFP and follow-up INR after infusion complete    Acute blood loss anemia -Baseline hemoglobin around 13 -Hemoglobin at presentation 9.2 -Today hemoglobin has dropped to 7.5 -Transfuse 2 units packed red blood cells  Pancytopenia -Patient with known chronic thrombocytopenia with platelets now lower than  typical range - suspect acute drop due to consumption -Her white count is also variable and low at times  -She now has a new elevated ammonia level of 103 with slightly elevated total bilirubin of 1.8-this is suggestive of hemolysis process and again likely due to consumption from acute GI bleeding -As precaution we'll check LDH and haptoglobin  Increased ammonia level -See above  CAD -Currently asymptomatic -Recommend keep hemoglobin greater than or equal to 8.0  Essential hypertension -Blood pressure actually solve so usual antihypertensive medications on hold  Campath-induced atrial fibrillation -AV sequentially paced -Was on low-dose aspirin prior to admission  CKD stage 4, GFR 15-29 ml/min -Baseline renal function BUN 59 and creatinine 3.47 -Current BUN 97 with creatinine of 3.55  Esophageal dysmotility -Has required multiple dilatations in the past -Usually complains of "sore throat" which responds to sublingual nitroglycerin  Hyperthyroidism -Previously intolerant to PTU and apparently post-ablation since preadmission meds included Synthroid -TSH 0.365  HLD  -On Mevacor prior to admission  Pulmonary hypertension with Chronic right-sided heart failure and Severe tricuspid regurgitation -Currently stable on room air -Because of GI bleeding and suboptimal blood pressures usual Lasix and Zaroxolyn on hold as well as Apresoline and Imdur  DVT prophylaxis: Current INR supratherapeutic but once INR less than 2 we'll need to apply SCDs until safe to resume anticoagulation Code Status: DO NOT RESUSCITATE Family Communication: No family at bedside Disposition Plan/Expected LOS: Remain in stepdown  Consultants: Gastroenterology  Procedures: None  Cultures: None  Antibiotics: None  Objective: Blood pressure 124/51, pulse 61, temperature 97.6 F (36.4 C), temperature source Oral, resp. rate 14, height 5\' 7"  (1.702 m), weight 190 lb 4.1 oz (86.3 kg), SpO2 98  %.  Intake/Output Summary (Last 24 hours) at 07/23/14 1526 Last data filed at 07/23/14 1332  Gross per 24 hour  Intake 1347.75 ml  Output   1475 ml  Net -127.25 ml   Exam: Gen: No acute respiratory distress, appears fatigued but easily awakens and is oriented Chest: Clear to auscultation bilaterally without wheezes, rhonchi or crackles, room air Cardiac: Regular rate and rhythm, S1-S2, no rubs murmurs or gallops Abdomen: Soft nontender nondistended without obvious hepatosplenomegaly, no ascites Extremities: no signif c/c/edema B LE   Scheduled Meds:  Scheduled Meds: . sodium chloride   Intravenous STAT  . antiseptic oral rinse  7 mL Mouth Rinse q12n4p  . chlorhexidine  15 mL Mouth Rinse BID  . levothyroxine  50 mcg Intravenous Daily  . metoprolol  5 mg Intravenous 4 times per day  . mometasone-formoterol  2 puff Inhalation BID  . pantoprazole (PROTONIX) IV  40 mg Intravenous Q12H  . silver sulfADIAZINE  1 application Topical Daily  . sodium chloride  3 mL Intravenous Q12H  . tiotropium  18 mcg Inhalation Daily   Data Reviewed: Basic Metabolic Panel:  Recent Labs Lab 07/22/14 1247 07/23/14 0220  NA 141 142  K 4.7 4.3  CL 100 101  CO2 25 24  GLUCOSE 95 89  BUN 102* 97*  CREATININE 3.64* 3.55*  CALCIUM 9.2 9.0   Liver Function Tests:  Recent Labs Lab 07/22/14 1247  AST 21  ALT 8  ALKPHOS 65  BILITOT 1.8*  PROT 6.6  ALBUMIN 2.9*    Recent Labs Lab 07/22/14 1247  LIPASE 26    Recent Labs Lab 07/22/14 2219  AMMONIA 103*   CBC:  Recent Labs Lab 07/22/14 1247 07/22/14 2209 07/23/14 0220 07/23/14 1025  WBC 4.6 3.6* 2.9* 2.9*  NEUTROABS 3.5  --   --   --   HGB 9.2* 7.9* 7.3* 7.5*  HCT 28.4* 23.2* 22.5* 22.8*  MCV 88.8 90.6 89.6 88.7  PLT 128* 95* 90* 94*    Recent Results (from the past 240 hour(s))  MRSA PCR Screening     Status: None   Collection Time: 07/22/14 10:09 PM  Result Value Ref Range Status   MRSA by PCR NEGATIVE NEGATIVE  Final    Comment:        The GeneXpert MRSA Assay (FDA approved for NASAL specimens only), is one component of a comprehensive MRSA colonization surveillance program. It is not intended to diagnose MRSA infection nor to guide or monitor treatment for MRSA infections.      Studies:  Recent x-ray studies have been reviewed in detail by the Attending Physician  Time spent : [place], ANP [facility] Office  [phone] Pager [phone]  On-Call/Text Page:      [email or web address]      password [email or web address]  If 7PM-7AM, please contact night-coverage [email or web address] Password [email or web address] 07/23/2014, 3:26 PM   LOS: 1 day   I have personally examined this patient and reviewed the entire database. I have reviewed the above note, made any necessary editorial changes, and agree with its content.  Cherene Altes, MD [facility]

## 2014-07-23 NOTE — Progress Notes (Signed)
[facility]  Patient Status: Active (receiving services up to time of hospitalization)  [facility] is providing the following services: RN  If patient discharges after hours, please call [phone].   [name] 07/23/2014, 2:29 PM

## 2014-07-24 ENCOUNTER — Encounter (HOSPITAL_COMMUNITY): Payer: Self-pay | Admitting: Cardiovascular Disease

## 2014-07-24 ENCOUNTER — Inpatient Hospital Stay (HOSPITAL_COMMUNITY): Payer: Medicare Other

## 2014-07-24 DIAGNOSIS — I071 Rheumatic tricuspid insufficiency: Secondary | ICD-10-CM

## 2014-07-24 DIAGNOSIS — E785 Hyperlipidemia, unspecified: Secondary | ICD-10-CM

## 2014-07-24 DIAGNOSIS — R791 Abnormal coagulation profile: Secondary | ICD-10-CM

## 2014-07-24 DIAGNOSIS — N184 Chronic kidney disease, stage 4 (severe): Secondary | ICD-10-CM | POA: Insufficient documentation

## 2014-07-24 DIAGNOSIS — K224 Dyskinesia of esophagus: Secondary | ICD-10-CM

## 2014-07-24 DIAGNOSIS — I270 Primary pulmonary hypertension: Secondary | ICD-10-CM

## 2014-07-24 DIAGNOSIS — E038 Other specified hypothyroidism: Secondary | ICD-10-CM | POA: Insufficient documentation

## 2014-07-24 DIAGNOSIS — I509 Heart failure, unspecified: Secondary | ICD-10-CM

## 2014-07-24 DIAGNOSIS — D61818 Other pancytopenia: Secondary | ICD-10-CM

## 2014-07-24 LAB — PREPARE FRESH FROZEN PLASMA
UNIT DIVISION: 0
Unit division: 0

## 2014-07-24 LAB — CBC
HCT: 29.4 % — ABNORMAL LOW (ref 36.0–46.0)
HEMATOCRIT: 29.7 % — AB (ref 36.0–46.0)
Hemoglobin: 9.9 g/dL — ABNORMAL LOW (ref 12.0–15.0)
Hemoglobin: 9.7 g/dL — ABNORMAL LOW (ref 12.0–15.0)
MCH: 29.7 pg (ref 26.0–34.0)
MCH: 29.1 pg (ref 26.0–34.0)
MCHC: 33.3 g/dL (ref 30.0–36.0)
MCHC: 33 g/dL (ref 30.0–36.0)
MCV: 89.2 fL (ref 78.0–100.0)
MCV: 88.3 fL (ref 78.0–100.0)
Platelets: 83 10*3/uL — ABNORMAL LOW (ref 150–400)
Platelets: 88 10*3/uL — ABNORMAL LOW (ref 150–400)
RBC: 3.33 MIL/uL — ABNORMAL LOW (ref 3.87–5.11)
RBC: 3.33 MIL/uL — AB (ref 3.87–5.11)
RDW: 16.6 % — ABNORMAL HIGH (ref 11.5–15.5)
RDW: 16.9 % — ABNORMAL HIGH (ref 11.5–15.5)
WBC: 2.9 10*3/uL — AB (ref 4.0–10.5)
WBC: 4 10*3/uL (ref 4.0–10.5)

## 2014-07-24 LAB — COMPREHENSIVE METABOLIC PANEL
ALBUMIN: 3 g/dL — AB (ref 3.5–5.2)
ALK PHOS: 63 U/L (ref 39–117)
ALT: 10 U/L (ref 0–35)
AST: 18 U/L (ref 0–37)
Anion gap: 17 — ABNORMAL HIGH (ref 5–15)
BILIRUBIN TOTAL: 1.8 mg/dL — AB (ref 0.3–1.2)
BUN: 91 mg/dL — ABNORMAL HIGH (ref 6–23)
CHLORIDE: 98 meq/L (ref 96–112)
CO2: 24 mEq/L (ref 19–32)
Calcium: 9.2 mg/dL (ref 8.4–10.5)
Creatinine, Ser: 3.5 mg/dL — ABNORMAL HIGH (ref 0.50–1.10)
GFR calc Af Amer: 13 mL/min — ABNORMAL LOW (ref >90)
GFR calc non Af Amer: 12 mL/min — ABNORMAL LOW (ref >90)
Glucose, Bld: 86 mg/dL (ref 70–99)
POTASSIUM: 3.4 meq/L — AB (ref 3.7–5.3)
SODIUM: 139 meq/L (ref 137–147)
Total Protein: 6.7 g/dL (ref 6.0–8.3)

## 2014-07-24 LAB — LACTATE DEHYDROGENASE: LDH: 288 U/L — ABNORMAL HIGH (ref 94–250)

## 2014-07-24 LAB — TYPE AND SCREEN
ABO/RH(D): O POS
ANTIBODY SCREEN: NEGATIVE
Unit division: 0
Unit division: 0

## 2014-07-24 LAB — HAPTOGLOBIN: Haptoglobin: 46 mg/dL (ref 45–215)

## 2014-07-24 LAB — PROTIME-INR
INR: 2.35 — AB (ref 0.00–1.49)
PROTHROMBIN TIME: 26 s — AB (ref 11.6–15.2)

## 2014-07-24 MED ORDER — PHYTONADIONE 5 MG PO TABS
5.0000 mg | ORAL_TABLET | Freq: Once | ORAL | Status: AC
Start: 2014-07-24 — End: 2014-07-24
  Administered 2014-07-24: 5 mg via ORAL
  Filled 2014-07-24: qty 1

## 2014-07-24 MED ORDER — OXYCODONE-ACETAMINOPHEN 5-325 MG PO TABS
1.0000 | ORAL_TABLET | Freq: Once | ORAL | Status: AC
  Administered 2014-07-24: 1 via ORAL
  Filled 2014-07-24: qty 1

## 2014-07-24 MED ORDER — SODIUM CHLORIDE 0.9 % IV SOLN
Freq: Once | INTRAVENOUS | Status: AC
  Administered 2014-07-24: 11:00:00 via INTRAVENOUS

## 2014-07-24 NOTE — Progress Notes (Signed)
[facility]  [name] [id number]:[id number] DOB: Mar 22, 1935 DOA: 07/22/2014 PCP: Tamsen Roers, MD  Brief narrative: 79 year old female patient with atrial fibrillation on Coumadin, known CAD, known multiple GI disorders including prior benign gastric polyp, GI bleed secondary to gastric ulcers after NSAIDs use, as well as esophageal dysmotility, hypertension, and chronic kidney disease who presented to the ER after her family found out she had had 2 bloody bowel movements and an episode of coffee-ground emesis. Patient denied abdominal pain.  In the ER the patient was found to have a supratherapeutic INR level of 6.23. She was also thrombocytopenic with a platelet count of 128,000 but she does have a degree of chronic thrombocytopenia with readings anywhere between 113,000 and 139,000 in the past few months. In the ER she was given vitamin K and 2 units of FFP.  HPI/Subjective: Awake, still endorsing malaise and some dark stools  Assessment/Plan:  Acute GI bleed / history of Gastric ulcer (2008) -Likely influenced by supratherapeutic INR therefore continue to work on correcting INR -Continue Protonix IV every 12 hours -GI consulted-EGD planned for today 12/10 -BUN elevated reflective of ongoing upper GI blood loss  Supratherapeutic INR / Long term current use of anticoagulant therapy -Was given vitamin K and 2 units of FFP at time of admission -Unfortunately INR has only decreased to 3.29 and her hemoglobin continues to decrease and she continues with dark stools -Given additional 2 units FFP on 12/09 and post transfusion INR was 2.35 -since going for EGD will give additional 2 FFP today 12/10; addendum IV access lost a PCCM attempted to place R IJ, unsuccessfully. Patient has left chest wall pacer will request IR place CVL under fluoroscopy to ensure pacer wires are not disturbed.  -Unable to complete FFP or PRBC secondary to loss IV access -  Vitamin K PO 5 mg x 1 -GI to EGD patient once IV access obtained.    Acute blood loss anemia -Baseline hemoglobin around 13 -Hemoglobin at presentation 9.2 -Today hemoglobin has dropped to 7.5 -Transfused 2 units packed red blood cells on 12/9 with Hgb up to 9.9 -follow CBC q 12 hours  Pancytopenia -Patient with known chronic thrombocytopenia with platelets now lower than typical range - suspect acute drop due to consumption -Her white count is also variable and low at times  -She now has a new elevated ammonia level of 103 with slightly elevated total bilirubin of 1.8-this is suggestive of hemolysis process and again likely due to consumption from acute GI bleeding -As precaution we checked LDH which was mildly elevated at 288 but haptoglobin 46 so not hemolyzing  Abnormal UA -appears c/w UTI so ck cx -if has gram neg UTI could explain some of the thrombocytopenia  Increased ammonia level -See above  Stage 3 left ischial decubitus -cont preadmit wound care  CAD -Currently asymptomatic -Recommend keep hemoglobin greater than or equal to 8.0  Essential hypertension -Blood pressure actually soft so usual antihypertensive medications on hold  Campath-induced atrial fibrillation -AV sequentially paced -Was on low-dose aspirin prior to admission  CKD stage 4, GFR 15-29 ml/min -Baseline renal function BUN 59 and creatinine 3.47 -Current BUN 97 with creatinine of 3.55  Esophageal dysmotility -Has required multiple dilatations in the past -Usually complains of "sore throat" which responds to sublingual nitroglycerin  Hyperthyroidism -Previously intolerant to PTU and apparently post-ablation since preadmission meds included Synthroid -TSH 0.365  HLD  -On Mevacor prior to admission  Pulmonary hypertension  with Chronic right-sided heart failure and Severe tricuspid regurgitation -Currently stable on room air -Because of GI bleeding and suboptimal blood pressures usual Lasix  and Zaroxolyn on hold as well as Apresoline and Imdur    DVT prophylaxis: Current INR supratherapeutic but once INR less than 2 we'll need to apply SCDs until safe to resume anticoagulation Code Status: DO NOT RESUSCITATE Family Communication: No family at bedside Disposition Plan/Expected LOS: Remain in stepdown-will need PT/OT eval soon  Consultants: Gastroenterology/Dr. Henrene Pastor  Procedures: None  Cultures: Urine cx pending  Antibiotics: None  Objective: Blood pressure 139/57, pulse 60, temperature 97.5 F (36.4 C), temperature source Oral, resp. rate 15, height 5\' 7"  (1.702 m), weight 190 lb 4.1 oz (86.3 kg), SpO2 99 %.  Intake/Output Summary (Last 24 hours) at 07/24/14 1249 Last data filed at 07/24/14 1200  Gross per 24 hour  Intake   2632 ml  Output   1515 ml  Net   1117 ml   Exam: Gen: No acute respiratory distress, continues to appear chronically ill Chest: Clear to auscultation bilaterally, room air Cardiac: Regular rate and rhythm, S1-S2, no rubs murmurs or gallops-blood pressure has improved Abdomen: Soft nontender nondistended without obvious hepatosplenomegaly, no ascites Extremities: no signif c/c/edema B LE   Scheduled Meds:  Scheduled Meds: . sodium chloride   Intravenous Once  . antiseptic oral rinse  7 mL Mouth Rinse q12n4p  . chlorhexidine  15 mL Mouth Rinse BID  . levothyroxine  100 mcg Oral QAC breakfast  . mometasone-formoterol  2 puff Inhalation BID  . pantoprazole (PROTONIX) IV  40 mg Intravenous Q12H  . silver sulfADIAZINE  1 application Topical Daily  . sodium chloride  3 mL Intravenous Q12H  . tiotropium  18 mcg Inhalation Daily   Data Reviewed: Basic Metabolic Panel:  Recent Labs Lab 07/22/14 1247 07/23/14 0220 07/24/14 0403  NA 141 142 139  K 4.7 4.3 3.4*  CL 100 101 98  CO2 25 24 24   GLUCOSE 95 89 86  BUN 102* 97* 91*  CREATININE 3.64* 3.55* 3.50*  CALCIUM 9.2 9.0 9.2   Liver Function Tests:  Recent Labs Lab  07/22/14 1247 07/24/14 0403  AST 21 18  ALT 8 10  ALKPHOS 65 63  BILITOT 1.8* 1.8*  PROT 6.6 6.7  ALBUMIN 2.9* 3.0*    Recent Labs Lab 07/22/14 1247  LIPASE 26    Recent Labs Lab 07/22/14 2219  AMMONIA 103*   CBC:  Recent Labs Lab 07/22/14 1247 07/22/14 2209 07/23/14 0220 07/23/14 1025 07/24/14 0403  WBC 4.6 3.6* 2.9* 2.9* 2.9*  NEUTROABS 3.5  --   --   --   --   HGB 9.2* 7.9* 7.3* 7.5* 9.9*  HCT 28.4* 23.2* 22.5* 22.8* 29.7*  MCV 88.8 90.6 89.6 88.7 89.2  PLT 128* 95* 90* 94* 83*    Recent Results (from the past 240 hour(s))  MRSA PCR Screening     Status: None   Collection Time: 07/22/14 10:09 PM  Result Value Ref Range Status   MRSA by PCR NEGATIVE NEGATIVE Final    Comment:        The GeneXpert MRSA Assay (FDA approved for NASAL specimens only), is one component of a comprehensive MRSA colonization surveillance program. It is not intended to diagnose MRSA infection nor to guide or monitor treatment for MRSA infections.      Studies:  Recent x-ray studies have been reviewed in detail by the Attending Physician  Time spent : 35 mins  Erin Hearing, ANP [facility] Office  [phone] Pager [phone]  On-Call/Text Page:      [email or web address]      password [email or web address]  If 7PM-7AM, please contact night-coverage [email or web address] Password [email or web address] 07/24/2014, 12:49 PM   LOS: 2 days  Examined patient and discussed assessment and plan with ANP Ebony Hail and agree with above. Discuss plan of care with patient and son to include explaining risks and benefits of CVL insertion, patient and son agreed to procedure. Answered all questions. Patient with multiple complex medical problems> 60 minutes spent in direct patient care

## 2014-07-24 NOTE — Progress Notes (Signed)
[facility] Progress Note Patient Name: [name] DOB: Mar 22, 1935 MRN: [id number]   Date of Service  07/24/2014  HPI/Events of Note  Asked to check cxr  eICU Interventions  cxr ok s/p cvl attempt, no PTX     Intervention Category Intermediate Interventions: Diagnostic test evaluation  Asencion Noble 07/24/2014, 6:11 PM

## 2014-07-24 NOTE — Progress Notes (Signed)
Daily Rounding Note  07/24/2014, 10:53 AM  LOS: 2 days   SUBJECTIVE:       Small black stool overnight.  No emesis.  Per RN: no complaints.  sleeping currently.  OBJECTIVE:         Vital signs in last 24 hours:    Temp:  [97.4 F (36.3 C)-98.7 F (37.1 C)] 97.5 F (36.4 C) (12/10 0800) Pulse Rate:  [58-72] 72 (12/10 0800) Resp:  [13-23] 17 (12/10 0800) BP: (90-154)/(49-80) 154/69 mmHg (12/10 0800) SpO2:  [96 %-100 %] 99 % (12/10 0800) Last BM Date: 07/23/14 Filed Weights   07/23/14 0419  Weight: 190 lb 4.1 oz (86.3 kg)   General: laying sleeping in bed, did not perform manual exam.  Looks ill.  Heart: Paced, regular rhythm on monitor.  Not tachy Chest: no labored breathing or cough   Intake/Output from previous day: 12/09 0701 - 12/10 0700 In: 1949 [I.V.:650; Blood:1299] Out: 1640 [Urine:1625; Emesis/NG output:15]  Intake/Output this shift: Total I/O In: 228 [I.V.:228] Out: 100 [Urine:100]  Lab Results:  Recent Labs  07/23/14 0220 07/23/14 1025 07/24/14 0403  WBC 2.9* 2.9* 2.9*  HGB 7.3* 7.5* 9.9*  HCT 22.5* 22.8* 29.7*  PLT 90* 94* 83*   BMET  Recent Labs  07/22/14 1247 07/23/14 0220 07/24/14 0403  NA 141 142 139  K 4.7 4.3 3.4*  CL 100 101 98  CO2 25 24 24   GLUCOSE 95 89 86  BUN 102* 97* 91*  CREATININE 3.64* 3.55* 3.50*  CALCIUM 9.2 9.0 9.2   LFT  Recent Labs  07/22/14 1247 07/24/14 0403  PROT 6.6 6.7  ALBUMIN 2.9* 3.0*  AST 21 18  ALT 8 10  ALKPHOS 65 63  BILITOT 1.8* 1.8*   PT/INR  Recent Labs  07/23/14 0220 07/24/14 0403  LABPROT 33.7* 26.0*  INR 3.29* 2.35*    Scheduled Meds: . sodium chloride   Intravenous Once  . antiseptic oral rinse  7 mL Mouth Rinse q12n4p  . chlorhexidine  15 mL Mouth Rinse BID  . levothyroxine  100 mcg Oral QAC breakfast  . mometasone-formoterol  2 puff Inhalation BID  . pantoprazole (PROTONIX) IV  40 mg Intravenous Q12H  .  silver sulfADIAZINE  1 application Topical Daily  . sodium chloride  3 mL Intravenous Q12H  . tiotropium  18 mcg Inhalation Daily   Continuous Infusions: . sodium chloride 75 mL/hr at 07/24/14 0400   PRN Meds:.morphine injection, nitroGLYCERIN, ondansetron **OR** ondansetron (ZOFRAN) IV   ASSESMENT:   * Upper GI bleed. In setting of iatrogenic coagulopathy. Despite twice daily Protonix, she developed nausea vomiting and eventually hematemesis. Rule out ulcer disease which she had in 2008; though with PPI therapy this is less likely. Rule out Mallory-Weiss tear. No signs of varices or portal hypertension on EGD of 07/2013. Remains on BID IV Protonix.   * ABL anemia. Baseline of chronic anemia, not taking iron at home. MCV normal. S/P 2 units PRBCs, Hgb improved.   * Iatrogenic coagulopathy. INR improved but still supratherapeutic following FFP x 4 and vitamin K. 2 more units FFP ordered and to transfuse today.  Course of Keflex last week may have been inciting factor for coagulopathy Takes Coumadin for history of atrial fib.  * Thrombocytopenia  * Elevated ammonia level, essentially normal LFTs, no history of cirrhosis. Patient not currently confused. Last abdominal imaging was noncontrasted CT of March 2015 showing benign hepatic cysts.   *  History of dysphagia with presbyesophagus. Status post empiric dilation of non-strictured esophagus one year ago. Dysphagia is not currently an issue    PLAN   *  2 additional units FFP ordered by attending MD.  Dr Henrene Pastor ok with proceeding with diagnostic EGD even with INR of 2.3 *  EGD at 1400 today. (Rescheduled, see below)   Azucena Freed  07/24/2014, 10:53 AM Pager: [phone]  GI ATTENDING  Interval data and history reviewed. Agree with progress note as above. Unfortunately, the patient has no IV access. She is for PICC line placement today. Fortunately, she is stable without obvious significant GI bleeding. INR still little  elevated. Endoscopy rescheduled for tomorrow.  Docia Chuck. Geri [place]., M.D. [facility] Division of Gastroenterology

## 2014-07-24 NOTE — Plan of Care (Signed)
Problem: Phase I Progression Outcomes Goal: OOB as tolerated unless otherwise ordered Outcome: Progressing     

## 2014-07-24 NOTE — Progress Notes (Signed)
Pt IV access lost. MD notified. RN, staff, and IV team attempted to place IV. Primary MD on floor. IV not obtained. CL recommended per IV team. PCCM Brandi attempted to place CL at bedside. Access not obtained. IR to place in AM per Dr. Sherral Hammers. 1 unit of FFP per order given. 1 unit of FFP remains to be given upon IV access. MD aware. New orders written.

## 2014-07-24 NOTE — Progress Notes (Signed)
Called to place central line for ESRD patient admitted with GIB in the setting of supratherapeutic INR.  Medications, allergies and indication for line reviewed.  Attempted to place R IJ TLC, able to cannulate vessel without difficulty, unable to pass wire.  Noted pacemaker on L.  Discussed with Dr. Sherral Hammers.  May be safest to have IR place central line under fluoro to avoid disturbing pacemaker leads.     Plan: Pressure held to site, dressing applied Attending MD aware (Dr. Sherral Hammers) CXR to confirm no complications  Noe Gens, NP-C [facility] Pgr: [phone] or [phone]

## 2014-07-25 ENCOUNTER — Inpatient Hospital Stay (HOSPITAL_COMMUNITY): Payer: Medicare Other | Admitting: Anesthesiology

## 2014-07-25 ENCOUNTER — Inpatient Hospital Stay (HOSPITAL_COMMUNITY): Payer: Medicare Other

## 2014-07-25 ENCOUNTER — Encounter (HOSPITAL_COMMUNITY)
Admission: EM | Disposition: A | Discharge status: Skilled Nursing Facility | Payer: Self-pay | Source: Home / Self Care | Attending: Internal Medicine

## 2014-07-25 ENCOUNTER — Encounter (HOSPITAL_COMMUNITY): Payer: Self-pay | Admitting: Physician Assistant

## 2014-07-25 DIAGNOSIS — R7989 Other specified abnormal findings of blood chemistry: Secondary | ICD-10-CM

## 2014-07-25 HISTORY — PX: ESOPHAGOGASTRODUODENOSCOPY: SHX5428

## 2014-07-25 LAB — CBC
HCT: 29.8 % — ABNORMAL LOW (ref 36.0–46.0)
HEMATOCRIT: 30 % — AB (ref 36.0–46.0)
HEMOGLOBIN: 9.7 g/dL — AB (ref 12.0–15.0)
Hemoglobin: 9.9 g/dL — ABNORMAL LOW (ref 12.0–15.0)
MCH: 28.9 pg (ref 26.0–34.0)
MCH: 29.4 pg (ref 26.0–34.0)
MCHC: 32.6 g/dL (ref 30.0–36.0)
MCHC: 33 g/dL (ref 30.0–36.0)
MCV: 88.7 fL (ref 78.0–100.0)
MCV: 89 fL (ref 78.0–100.0)
PLATELETS: 85 10*3/uL — AB (ref 150–400)
Platelets: 87 10*3/uL — ABNORMAL LOW (ref 150–400)
RBC: 3.36 MIL/uL — AB (ref 3.87–5.11)
RBC: 3.37 MIL/uL — ABNORMAL LOW (ref 3.87–5.11)
RDW: 17.1 % — ABNORMAL HIGH (ref 11.5–15.5)
RDW: 17.1 % — AB (ref 11.5–15.5)
WBC: 4.6 10*3/uL (ref 4.0–10.5)
WBC: 5 10*3/uL (ref 4.0–10.5)

## 2014-07-25 LAB — PROTIME-INR
INR: 2.26 — ABNORMAL HIGH (ref 0.00–1.49)
Prothrombin Time: 25.1 seconds — ABNORMAL HIGH (ref 11.6–15.2)

## 2014-07-25 LAB — BASIC METABOLIC PANEL
Anion gap: 19 — ABNORMAL HIGH (ref 5–15)
BUN: 85 mg/dL — ABNORMAL HIGH (ref 6–23)
CALCIUM: 9.3 mg/dL (ref 8.4–10.5)
CO2: 24 meq/L (ref 19–32)
CREATININE: 3.2 mg/dL — AB (ref 0.50–1.10)
Chloride: 99 mEq/L (ref 96–112)
GFR calc Af Amer: 15 mL/min — ABNORMAL LOW (ref >90)
GFR, EST NON AFRICAN AMERICAN: 13 mL/min — AB (ref >90)
GLUCOSE: 93 mg/dL (ref 70–99)
Potassium: 3.3 mEq/L — ABNORMAL LOW (ref 3.7–5.3)
Sodium: 142 mEq/L (ref 137–147)

## 2014-07-25 LAB — URINE CULTURE

## 2014-07-25 LAB — MAGNESIUM: MAGNESIUM: 1.8 mg/dL (ref 1.5–2.5)

## 2014-07-25 LAB — AMMONIA: Ammonia: 71 umol/L — ABNORMAL HIGH (ref 11–60)

## 2014-07-25 LAB — PHOSPHORUS: Phosphorus: 3.7 mg/dL (ref 2.3–4.6)

## 2014-07-25 SURGERY — EGD (ESOPHAGOGASTRODUODENOSCOPY)
Anesthesia: Monitor Anesthesia Care

## 2014-07-25 MED ORDER — HYDRALAZINE HCL 25 MG PO TABS
25.0000 mg | ORAL_TABLET | Freq: Three times a day (TID) | ORAL | Status: DC
  Administered 2014-07-25 – 2014-07-28 (×7): 25 mg via ORAL
  Filled 2014-07-25 (×15): qty 1

## 2014-07-25 MED ORDER — LACTATED RINGERS IV SOLN
INTRAVENOUS | Status: DC | PRN
  Administered 2014-07-25: 13:00:00 via INTRAVENOUS

## 2014-07-25 MED ORDER — POTASSIUM CHLORIDE CRYS ER 20 MEQ PO TBCR
40.0000 meq | EXTENDED_RELEASE_TABLET | Freq: Once | ORAL | Status: AC
  Administered 2014-07-25: 40 meq via ORAL
  Filled 2014-07-25: qty 2

## 2014-07-25 MED ORDER — ISOSORBIDE MONONITRATE ER 60 MG PO TB24
120.0000 mg | ORAL_TABLET | Freq: Every day | ORAL | Status: DC
  Administered 2014-07-25 – 2014-07-29 (×5): 120 mg via ORAL
  Filled 2014-07-25 (×5): qty 2

## 2014-07-25 MED ORDER — ALPRAZOLAM 0.5 MG PO TABS: 0.5000 mg | ORAL_TABLET | Freq: Every evening | ORAL | Status: DC | PRN

## 2014-07-25 MED ORDER — OXYCODONE HCL 5 MG PO TABS
5.0000 mg | ORAL_TABLET | ORAL | Status: DC | PRN
  Administered 2014-07-25: 5 mg via ORAL
  Administered 2014-07-26 – 2014-07-27 (×3): 10 mg via ORAL
  Filled 2014-07-25: qty 1
  Filled 2014-07-25 (×3): qty 2

## 2014-07-25 MED ORDER — PANTOPRAZOLE SODIUM 40 MG PO TBEC
40.0000 mg | DELAYED_RELEASE_TABLET | Freq: Every day | ORAL | Status: DC
  Administered 2014-07-26 – 2014-07-29 (×4): 40 mg via ORAL
  Filled 2014-07-25 (×4): qty 1

## 2014-07-25 MED ORDER — SODIUM CHLORIDE 0.9 % IV SOLN: INTRAVENOUS | Status: DC

## 2014-07-25 MED ORDER — PROPOFOL INFUSION 10 MG/ML OPTIME
INTRAVENOUS | Status: DC | PRN
  Administered 2014-07-25: 100 ug/kg/min via INTRAVENOUS

## 2014-07-25 MED ORDER — LIDOCAINE HCL 1 % IJ SOLN
INTRAMUSCULAR | Status: AC
  Filled 2014-07-25: qty 20

## 2014-07-25 MED ORDER — BUTAMBEN-TETRACAINE-BENZOCAINE 2-2-14 % EX AERO
INHALATION_SPRAY | CUTANEOUS | Status: DC | PRN
  Administered 2014-07-25: 2 via TOPICAL

## 2014-07-25 NOTE — Op Note (Signed)
[facility] [place], [id number]   ENDOSCOPY PROCEDURE REPORT  PATIENT: [name], [name]  MR#: [id number] BIRTHDATE: [birth year] , 79  yrs. old GENDER: female ENDOSCOPIST: Eustace Quail, MD REFERRED BY:  [facility] PROCEDURE DATE:  07/25/2014 PROCEDURE:  EGD, diagnostic ASA CLASS:     Class III INDICATIONS:  melena.   in the face of over anticoagulation MEDICATIONS: Monitored anesthesia care and Per Anesthesia TOPICAL ANESTHETIC: none  DESCRIPTION OF PROCEDURE: After the risks benefits and alternatives of the procedure were thoroughly explained, informed consent was obtained.  The Pentax Gastroscope [id number] endoscope was introduced through the mouth and advanced to the second portion of the duodenum , Without limitations.  The instrument was slowly withdrawn as the mucosa was fully examined.  EXAM: Esophagus revealed a distal esophageal ring which was large caliber.  otherwise normal.  The stomach was normal with somewhat atrophic mucosa.  The duodenal bulb and post bulbar duodenum to the third portion was normal.  There was no blood in the upper gut. Retroflexed views revealed a hiatal hernia.     The scope was then withdrawn from the patient and the procedure completed.  COMPLICATIONS: There were no immediate complications.  ENDOSCOPIC IMPRESSION: 1. Distal esophageal ring. Otherwise normal EGD  RECOMMENDATIONS: 1. Continue PPI 2. Resume diet 3. Keep anticoagulation therapeutic 4. No further GI workup planned unless rebleeding occurs.  Discussed with patient and family. Will sign off.  REPEAT EXAM:  eSigned:  Eustace Quail, MD 07/25/2014 1:52 PM    [id number] Ardis Hughs, MD, Tamsen Roers, MD, and The Patient

## 2014-07-25 NOTE — Anesthesia Postprocedure Evaluation (Signed)
  Anesthesia Post-op Note  Patient: [name]  Procedure(s) Performed: Procedure(s): ESOPHAGOGASTRODUODENOSCOPY (EGD) (N/A)  Patient Location: Endoscopy Unit  Anesthesia Type:MAC  Level of Consciousness: awake and alert   Airway and Oxygen Therapy: Patient Spontanous Breathing  Post-op Pain: none  Post-op Assessment: Post-op Vital signs reviewed  Post-op Vital Signs: stable  Last Vitals:  Filed Vitals:   07/25/14 1410  BP: 147/47  Pulse: 62  Temp:   Resp: 14    Complications: No apparent anesthesia complications

## 2014-07-25 NOTE — Transfer of Care (Signed)
Immediate Anesthesia Transfer of Care Note  Patient: [name]  Procedure(s) Performed: Procedure(s): ESOPHAGOGASTRODUODENOSCOPY (EGD) (N/A)  Patient Location: Endoscopy Unit  Anesthesia Type:MAC  Level of Consciousness: awake, alert  and oriented  Airway & Oxygen Therapy: Patient Spontanous Breathing and Patient connected to nasal cannula oxygen  Post-op Assessment: Report given to PACU RN and Post -op Vital signs reviewed and stable  Post vital signs: Reviewed and stable  Complications: No apparent anesthesia complications

## 2014-07-25 NOTE — Progress Notes (Signed)
[facility]  LOUCILE POSNER EHU:[id number] DOB: [birth year] DOA: 07/22/2014 PCP: Tamsen Roers, MD  Brief narrative: 79 year old female patient with atrial fibrillation on Coumadin, known CAD, known multiple GI disorders including prior benign gastric polyp, GI bleed secondary to gastric ulcers after NSAIDs use, as well as esophageal dysmotility, hypertension, and chronic kidney disease who presented to the ER after her family found out she had had 2 bloody bowel movements and an episode of coffee-ground emesis. Patient denied abdominal pain.  In the ER the patient was found to have a supratherapeutic INR level of 6.23. She was also thrombocytopenic with a platelet count of 128,000 but she does have a degree of chronic thrombocytopenia with readings anywhere between 113,000 and 139,000 in the past few months. In the ER she was given vitamin K and 2 units of FFP.  HPI/Subjective: Complaining of left hip discomfort at site of decubitus, states has more energy today, last BM and past 24 hours a transition to brown color  Assessment/Plan:  Acute GI bleed / history of Gastric ulcer (2008) -Likely influenced by supratherapeutic INR -Continue Protonix  -GI consulted-EGD planned for 12/10 but unable to be accomplished due to loss of IV access; was completed on 12/11 and was unremarkable except for esophageal ring, no bleeding seen -BUN trending downward -Bowel movements have transitioned from black to brown  Supratherapeutic INR / Long term current use of anticoagulant therapy -Was given vitamin K and 2 units of FFP at time of admission -Given additional 2 units FFP on 12/09 and post transfusion INR was 2.35 -since EGD was planned for 12/10 was given an additional 2 units FFP  and another dose of Vitamin K PO 5 mg x 1 -As of 12/11 INR down to 2.26    Acute blood loss anemia -Baseline hemoglobin around 13 -Hemoglobin at presentation 9.2 - nadir  7.3 -Transfused 2 units packed red blood cells on 12/9 with Hgb up to 9.9 -follow CBC q 12 hours for an additional 24 hours  Pancytopenia -Patient with known chronic thrombocytopenia with platelets now lower than typical range - suspect acute drop due to consumption -Her white count is also variable and low at times  -As precaution we checked LDH which was mildly elevated at 288 but haptoglobin 46 so not hemolyzing  Abnormal UA -appears c/w UTI but urine cx did not prove to be helpful  Hypokalemia -Replete -Check Phosphorus and magnesium in AM   Increased ammonia level -Repeat in AM - ?due to GI blood breakdown   Stage 3 left ischial decubitus -cont preadmit wound care -Begin oxycodone when necessary for pain in addition to previously ordered IV morphine for severe pain  CAD -Currently asymptomatic -keep hemoglobin greater than or equal to 8.0  Essential hypertension -Blood pressure actually soft so usual antihypertensive medications on hold  Campath-induced atrial fibrillation -AV sequentially paced -Was on low-dose aspirin and warfarin prior to admission  CKD stage 4, GFR 15-29 ml/min -Baseline renal function BUN 59 and creatinine 3.47 -Current BUN is decreased at 85 with creatinine of 3.20  Esophageal dysmotility -Has required multiple dilatations in the past -Usually complains of "sore throat" which responds to sublingual nitroglycerin  Hyperthyroidism -Previously intolerant to PTU and apparently post-ablation since preadmission meds included Synthroid -TSH 0.365  HLD  -On Mevacor prior to admission  Pulmonary hypertension with Chronic right-sided heart failure and Severe tricuspid regurgitation -Currently stable on room air -Because of GI bleeding and suboptimal blood pressures  usual Lasix and Zaroxolyn were held as well as Apresoline and Imdur -12/11: We'll continue to hold diuretics for now but resume Imdur and a Apresoline  DVT prophylaxis: Current INR  supratherapeutic but once INR less than 2 we'll need to apply SCDs until safe to resume anticoagulation Code Status: DO NOT RESUSCITATE Family Communication: No family at bedside Disposition Plan/Expected LOS: Remain in stepdown - PT/OT ordered 12/11  Consultants: Gastroenterology  Procedures: EGD: Normal except for esophageal ring  Antibiotics: None  Objective: Blood pressure 110/65, pulse 62, temperature 98.2 F (36.8 C), temperature source Oral, resp. rate 18, height 5\' 7"  (1.702 m), weight 86.3 kg (190 lb 4.1 oz), SpO2 98 %.  Intake/Output Summary (Last 24 hours) at 07/25/14 1644 Last data filed at 07/25/14 1531  Gross per 24 hour  Intake    620 ml  Output    625 ml  Net     -5 ml   Exam: Gen: No acute respiratory distress, more alert and color improved Chest: Clear to auscultation bilaterally, room air Cardiac: Irregular rate and rhythm-noted with frequent PVCs and occasional PVC bigeminy, 3/6 systolic murmur, no rubs murmurs or gallops-blood pressure has improved Abdomen: Soft nontender nondistended without obvious hepatosplenomegaly, no ascites Extremities: no signif c/c/edema B LE   Scheduled Meds:  Scheduled Meds: . antiseptic oral rinse  7 mL Mouth Rinse q12n4p  . chlorhexidine  15 mL Mouth Rinse BID  . hydrALAZINE  25 mg Oral TID  . isosorbide mononitrate  120 mg Oral Daily  . levothyroxine  100 mcg Oral QAC breakfast  . lidocaine      . mometasone-formoterol  2 puff Inhalation BID  . pantoprazole (PROTONIX) IV  40 mg Intravenous Q12H  . silver sulfADIAZINE  1 application Topical Daily  . sodium chloride  3 mL Intravenous Q12H  . tiotropium  18 mcg Inhalation Daily   Data Reviewed: Basic Metabolic Panel:  Recent Labs Lab 07/22/14 1247 07/23/14 0220 07/24/14 0403 07/25/14 0317  NA 141 142 139 142  K 4.7 4.3 3.4* 3.3*  CL 100 101 98 99  CO2 25 24 24 24   GLUCOSE 95 89 86 93  BUN 102* 97* 91* 85*  CREATININE 3.64* 3.55* 3.50* 3.20*  CALCIUM 9.2 9.0  9.2 9.3   Liver Function Tests:  Recent Labs Lab 07/22/14 1247 07/24/14 0403  AST 21 18  ALT 8 10  ALKPHOS 65 63  BILITOT 1.8* 1.8*  PROT 6.6 6.7  ALBUMIN 2.9* 3.0*    Recent Labs Lab 07/22/14 1247  LIPASE 26    Recent Labs Lab 07/22/14 2219  AMMONIA 103*   CBC:  Recent Labs Lab 07/22/14 1247  07/23/14 1025 07/24/14 0403 07/24/14 1913 07/25/14 0317 07/25/14 1625  WBC 4.6  < > 2.9* 2.9* 4.0 4.6 5.0  NEUTROABS 3.5  --   --   --   --   --   --   HGB 9.2*  < > 7.5* 9.9* 9.7* 9.7* 9.9*  HCT 28.4*  < > 22.8* 29.7* 29.4* 29.8* 30.0*  MCV 88.8  < > 88.7 89.2 88.3 88.7 89.0  PLT 128*  < > 94* 83* 88* 87* 85*  < > = values in this interval not displayed.  Recent Results (from the past 240 hour(s))  MRSA PCR Screening     Status: None   Collection Time: 07/22/14 10:09 PM  Result Value Ref Range Status   MRSA by PCR NEGATIVE NEGATIVE Final    Comment:  The GeneXpert MRSA Assay (FDA approved for NASAL specimens only), is one component of a comprehensive MRSA colonization surveillance program. It is not intended to diagnose MRSA infection nor to guide or monitor treatment for MRSA infections.   Urine culture     Status: None   Collection Time: 07/24/14  9:44 AM  Result Value Ref Range Status   Specimen Description URINE, RANDOM  Final   Special Requests NONE  Final   Culture  Setup Time   Final    07/24/2014 10:19 Performed at [facility]   Final    >=100,000 COLONIES/ML Performed at [facility]    Culture   Final    Multiple bacterial morphotypes present, none predominant. Suggest appropriate recollection if clinically indicated. Performed at [facility]    Report Status 07/25/2014 FINAL  Final     Studies:  Recent x-ray studies have been reviewed in detail by the Attending Physician  Time spent : [place], ANP [facility] Office  [phone] Pager  [phone]  On-Call/Text Page:      [email or web address]      password [email or web address]  If 7PM-7AM, please contact night-coverage [email or web address] Password [email or web address] 07/25/2014, 4:44 PM   LOS: 3 days   I have personally examined this patient and reviewed the entire database. I have reviewed the above note, made any necessary editorial changes, and agree with its content.  Cherene Altes, MD [facility]

## 2014-07-25 NOTE — Procedures (Signed)
Successful RT IJ dbl lumen power picc insertion No comp Tip svc/ra  Ready for use

## 2014-07-25 NOTE — Anesthesia Preprocedure Evaluation (Addendum)
Anesthesia Evaluation  Patient identified by MRN, date of birth, ID band Patient awake    Reviewed: Allergy & Precautions, H&P , NPO status , Patient's Chart, lab work & pertinent test results  Airway Mallampati: II       Dental  (+) Edentulous Upper, Dental Advisory Given, Edentulous Lower   Pulmonary pneumonia -, former smoker,    + decreased breath sounds      Cardiovascular hypertension, Pt. on medications + CAD, + Past MI, + Peripheral Vascular Disease and +CHF + dysrhythmias Atrial Fibrillation + pacemaker Rhythm:Irregular Rate:Normal  Echo noted-   Neuro/Psych  Headaches, Depression  Neuromuscular disease    GI/Hepatic hiatal hernia, PUD, GERD-  Medicated and Controlled,  Endo/Other  Hypothyroidism Hyperthyroidism   Renal/GU Renal InsufficiencyRenal disease     Musculoskeletal  (+) Arthritis -,   Abdominal   Peds  Hematology  (+) anemia ,   Anesthesia Other Findings   Reproductive/Obstetrics                         Anesthesia Physical Anesthesia Plan  ASA: IV  Anesthesia Plan: MAC   Post-op Pain Management:    Induction: Intravenous  Airway Management Planned: Natural Airway  Additional Equipment:   Intra-op Plan:   Post-operative Plan:   Informed Consent: I have reviewed the patients History and Physical, chart, labs and discussed the procedure including the risks, benefits and alternatives for the proposed anesthesia with the patient or authorized representative who has indicated his/her understanding and acceptance.     Plan Discussed with: CRNA and Surgeon  Anesthesia Plan Comments:         Anesthesia Quick Evaluation

## 2014-07-25 NOTE — Progress Notes (Signed)
Daily Rounding Note  07/25/2014, 9:51 AM  LOS: 3 days   SUBJECTIVE:      CCM unable to place central line, so IR planning to place this sometime today. Last stool yesterday was brown.  Last bloody stool was on 10/9.    Pt c/o low back pain.  Also very hungry.  No abd pain, no dyspnea, no nausea or emesis.   OBJECTIVE:         Vital signs in last 24 hours:    Temp:  [97.1 F (36.2 C)-98.9 F (37.2 C)] 98.9 F (37.2 C) (12/11 0700) Pulse Rate:  [59-75] 63 (12/11 0857) Resp:  [13-24] 24 (12/11 0857) BP: (123-145)/(46-63) 145/63 mmHg (12/11 0344) SpO2:  [93 %-100 %] 97 % (12/11 0857) Last BM Date: 07/24/14 Filed Weights   07/23/14 0419  Weight: 190 lb 4.1 oz (86.3 kg)   General: sleeping quietly, comfortable, aroused easily.  Chronically ill looking.    Heart: RRR Chest: clear bil.  No labored breathing Abdomen: soft, baseball sized fullness, mass in LUQ .  Not tneder,  Active BS.  Extremities: non-pitting edema in feet, lower legs.   Derm:  Lots of bruising and venous hyper pigmentation beneath her skin in multiple limbs and trunk.  Neuro/Psych:  Appropriate, cooperative, relaxed. No gross deficits.   Intake/Output from previous day: 12/10 0701 - 12/11 0700 In: 733 [I.V.:553; Blood:180] Out: 700 [Urine:700]  Intake/Output this shift:    Lab Results:  Recent Labs  07/24/14 0403 07/24/14 1913 07/25/14 0317  WBC 2.9* 4.0 4.6  HGB 9.9* 9.7* 9.7*  HCT 29.7* 29.4* 29.8*  PLT 83* 88* 87*   BMET  Recent Labs  07/23/14 0220 07/24/14 0403 07/25/14 0317  NA 142 139 142  K 4.3 3.4* 3.3*  CL 101 98 99  CO2 24 24 24   GLUCOSE 89 86 93  BUN 97* 91* 85*  CREATININE 3.55* 3.50* 3.20*  CALCIUM 9.0 9.2 9.3   LFT  Recent Labs  07/22/14 1247 07/24/14 0403  PROT 6.6 6.7  ALBUMIN 2.9* 3.0*  AST 21 18  ALT 8 10  ALKPHOS 65 63  BILITOT 1.8* 1.8*   PT/INR  Recent Labs  07/24/14 0403 07/25/14 0317    LABPROT 26.0* 25.1*  INR 2.35* 2.26*   Hepatitis Panel No results for input(s): HEPBSAG, HCVAB, HEPAIGM, HEPBIGM in the last 72 hours.  Studies/Results: Dg Chest Port 1 View  07/24/2014   CLINICAL DATA:  79 year old female status post attempted central line. Initial encounter.  EXAM: PORTABLE CHEST - 1 VIEW  COMPARISON:  07/01/2014 and earlier.  FINDINGS: Portable AP semi upright view at 1701 hrs. Lower lung volumes. No pneumothorax. Stable cardiac size and mediastinal contours. Stable left chest cardiac pacemaker. Mildly increased bibasilar opacity most resembles atelectasis. No other acute pulmonary opacity. Stable cholecystectomy clips.  IMPRESSION: No pneumothorax. Low lung volumes, otherwise no acute cardiopulmonary abnormality.   Electronically Signed   By: Lars Pinks M.D.   On: 07/24/2014 17:28   Scheduled Meds: . antiseptic oral rinse  7 mL Mouth Rinse q12n4p  . chlorhexidine  15 mL Mouth Rinse BID  . levothyroxine  100 mcg Oral QAC breakfast  . mometasone-formoterol  2 puff Inhalation BID  . pantoprazole (PROTONIX) IV  40 mg Intravenous Q12H  . silver sulfADIAZINE  1 application Topical Daily  . sodium chloride  3 mL Intravenous Q12H  . tiotropium  18 mcg Inhalation Daily   Continuous Infusions: .  sodium chloride 75 mL/hr at 07/24/14 0400  . sodium chloride     PRN Meds:.morphine injection, nitroGLYCERIN, ondansetron **OR** ondansetron (ZOFRAN) IV, oxyCODONE  ASSESMENT:   * Upper GI bleed. In setting of iatrogenic coagulopathy. Despite twice daily Protonix, she developed nausea vomiting and eventually hematemesis. Rule out ulcer disease which she had in 2008; though with PPI therapy this is less likely. Rule out Mallory-Weiss tear.  Rule out angioectasia. No signs of varices or portal hypertension on EGD of 07/2013. Remains on BID IV Protonix. Bloody stools have resolved.   * ABL anemia. Baseline of chronic anemia, not taking iron at home. MCV normal. S/P 2 units PRBCs,  Hgb improved.   * Iatrogenic coagulopathy. INR improved to therapeutic range following FFP x 5 and vitamin K. Suspect course of Keflex last week was inciting factor for coagulopathy Takes Coumadin for history of atrial fib.  * Thrombocytopenia.  Stable.   * Elevated ammonia level, essentially normal LFTs, no history of cirrhosis. Patient not currently confused. Last abdominal imaging was noncontrasted CT of March 2015 showing benign hepatic cysts.   * History of dysphagia with presbyesophagus. Status post empiric dilation of non-strictured esophagus one year ago. Dysphagia is not currently an issue    *  Acute on chronic kidney dz.    PLAN   *  If central line placed can proceed to EGD this afternoon as planned.   *  Hopefully can begin solid diet for pt after EGD.  Dr Ardis Hughs is her GI MD.     Azucena Freed  07/25/2014, 9:51 AM Pager: [phone]  Agree. Patient for EGD today

## 2014-07-25 NOTE — Interval H&P Note (Signed)
History and Physical Interval Note:  07/25/2014 1:27 PM  [name]  has presented today for surgery, with the diagnosis of GI bleed, anemia.  The various methods of treatment have been discussed with the patient and family. After consideration of risks, benefits and other options for treatment, the patient has consented to  Procedure(s): ESOPHAGOGASTRODUODENOSCOPY (EGD) (N/A) as a surgical intervention .  The patient's history has been reviewed, patient examined, no change in status, stable for surgery.  I have reviewed the patient's chart and labs.  Questions were answered to the patient's satisfaction.     [name]

## 2014-07-25 NOTE — Progress Notes (Signed)
Medicare Important Message given? YES  (If response is "NO", the following Medicare IM given date fields will be blank)  Date Medicare IM given: 07/25/14 Medicare IM given by:  Kule Gascoigne  

## 2014-07-25 NOTE — H&P (View-Only) (Signed)
[facility]  [name] QMV:[id number] DOB: November 22, 1934 DOA: 07/22/2014 PCP: Tamsen Roers, MD  Brief narrative: 79 year old female patient with atrial fibrillation on Coumadin, known CAD, known multiple GI disorders including prior benign gastric polyp, GI bleed secondary to gastric ulcers after NSAIDs use, as well as esophageal dysmotility, hypertension, and chronic kidney disease who presented to the ER after her family found out she had had 2 bloody bowel movements and an episode of coffee-ground emesis. Patient denied abdominal pain.  In the ER the patient was found to have a supratherapeutic INR level of 6.23. She was also thrombocytopenic with a platelet count of 128,000 but she does have a degree of chronic thrombocytopenia with readings anywhere between 113,000 and 139,000 in the past few months. In the ER she was given vitamin K and 2 units of FFP.  HPI/Subjective: Awake and complaining of generalized fatigue and malaise. Continues to have dark stools since admission. Denied bloody stools or emesis.  Assessment/Plan:  Acute GI bleed / history of Gastric ulcer (2008) -Likely influenced by supratherapeutic INR therefore continue to work on correcting INR -Continue Protonix IV every 12 hours -GI consulted -BUN elevated reflective of ongoing upper GI blood loss  Supratherapeutic INR / Long term current use of anticoagulant therapy -Was given vitamin K and 2 units of FFP at time of admission -Unfortunately INR has only decreased to 3.29 and her hemoglobin continues to decrease and she continues with dark stools -Give additional 2 units FFP and follow-up INR after infusion complete    Acute blood loss anemia -Baseline hemoglobin around 13 -Hemoglobin at presentation 9.2 -Today hemoglobin has dropped to 7.5 -Transfuse 2 units packed red blood cells  Pancytopenia -Patient with known chronic thrombocytopenia with platelets now lower than  typical range - suspect acute drop due to consumption -Her white count is also variable and low at times  -She now has a new elevated ammonia level of 103 with slightly elevated total bilirubin of 1.8-this is suggestive of hemolysis process and again likely due to consumption from acute GI bleeding -As precaution we'll check LDH and haptoglobin  Increased ammonia level -See above  CAD -Currently asymptomatic -Recommend keep hemoglobin greater than or equal to 8.0  Essential hypertension -Blood pressure actually solve so usual antihypertensive medications on hold  Campath-induced atrial fibrillation -AV sequentially paced -Was on low-dose aspirin prior to admission  CKD stage 4, GFR 15-29 ml/min -Baseline renal function BUN 59 and creatinine 3.47 -Current BUN 97 with creatinine of 3.55  Esophageal dysmotility -Has required multiple dilatations in the past -Usually complains of "sore throat" which responds to sublingual nitroglycerin  Hyperthyroidism -Previously intolerant to PTU and apparently post-ablation since preadmission meds included Synthroid -TSH 0.365  HLD  -On Mevacor prior to admission  Pulmonary hypertension with Chronic right-sided heart failure and Severe tricuspid regurgitation -Currently stable on room air -Because of GI bleeding and suboptimal blood pressures usual Lasix and Zaroxolyn on hold as well as Apresoline and Imdur  DVT prophylaxis: Current INR supratherapeutic but once INR less than 2 we'll need to apply SCDs until safe to resume anticoagulation Code Status: DO NOT RESUSCITATE Family Communication: No family at bedside Disposition Plan/Expected LOS: Remain in stepdown  Consultants: Gastroenterology  Procedures: None  Cultures: None  Antibiotics: None  Objective: Blood pressure 124/51, pulse 61, temperature 97.6 F (36.4 C), temperature source Oral, resp. rate 14, height 5\' 7"  (1.702 m), weight 190 lb 4.1 oz (86.3 kg), SpO2 98  %.  Intake/Output Summary (Last 24 hours) at 07/23/14 1526 Last data filed at 07/23/14 1332  Gross per 24 hour  Intake 1347.75 ml  Output   1475 ml  Net -127.25 ml   Exam: Gen: No acute respiratory distress, appears fatigued but easily awakens and is oriented Chest: Clear to auscultation bilaterally without wheezes, rhonchi or crackles, room air Cardiac: Regular rate and rhythm, S1-S2, no rubs murmurs or gallops Abdomen: Soft nontender nondistended without obvious hepatosplenomegaly, no ascites Extremities: no signif c/c/edema B LE   Scheduled Meds:  Scheduled Meds: . sodium chloride   Intravenous STAT  . antiseptic oral rinse  7 mL Mouth Rinse q12n4p  . chlorhexidine  15 mL Mouth Rinse BID  . levothyroxine  50 mcg Intravenous Daily  . metoprolol  5 mg Intravenous 4 times per day  . mometasone-formoterol  2 puff Inhalation BID  . pantoprazole (PROTONIX) IV  40 mg Intravenous Q12H  . silver sulfADIAZINE  1 application Topical Daily  . sodium chloride  3 mL Intravenous Q12H  . tiotropium  18 mcg Inhalation Daily   Data Reviewed: Basic Metabolic Panel:  Recent Labs Lab 07/22/14 1247 07/23/14 0220  NA 141 142  K 4.7 4.3  CL 100 101  CO2 25 24  GLUCOSE 95 89  BUN 102* 97*  CREATININE 3.64* 3.55*  CALCIUM 9.2 9.0   Liver Function Tests:  Recent Labs Lab 07/22/14 1247  AST 21  ALT 8  ALKPHOS 65  BILITOT 1.8*  PROT 6.6  ALBUMIN 2.9*    Recent Labs Lab 07/22/14 1247  LIPASE 26    Recent Labs Lab 07/22/14 2219  AMMONIA 103*   CBC:  Recent Labs Lab 07/22/14 1247 07/22/14 2209 07/23/14 0220 07/23/14 1025  WBC 4.6 3.6* 2.9* 2.9*  NEUTROABS 3.5  --   --   --   HGB 9.2* 7.9* 7.3* 7.5*  HCT 28.4* 23.2* 22.5* 22.8*  MCV 88.8 90.6 89.6 88.7  PLT 128* 95* 90* 94*    Recent Results (from the past 240 hour(s))  MRSA PCR Screening     Status: None   Collection Time: 07/22/14 10:09 PM  Result Value Ref Range Status   MRSA by PCR NEGATIVE NEGATIVE  Final    Comment:        The GeneXpert MRSA Assay (FDA approved for NASAL specimens only), is one component of a comprehensive MRSA colonization surveillance program. It is not intended to diagnose MRSA infection nor to guide or monitor treatment for MRSA infections.      Studies:  Recent x-ray studies have been reviewed in detail by the Attending Physician  Time spent : [place], ANP [facility] Office  [phone] Pager [phone]  On-Call/Text Page:      [email or web address]      password [email or web address]  If 7PM-7AM, please contact night-coverage [email or web address] Password [email or web address] 07/23/2014, 3:26 PM   LOS: 1 day   I have personally examined this patient and reviewed the entire database. I have reviewed the above note, made any necessary editorial changes, and agree with its content.  Cherene Altes, MD [facility]

## 2014-07-26 LAB — CBC
HCT: 28.1 % — ABNORMAL LOW (ref 36.0–46.0)
HCT: 28.5 % — ABNORMAL LOW (ref 36.0–46.0)
Hemoglobin: 9.1 g/dL — ABNORMAL LOW (ref 12.0–15.0)
Hemoglobin: 9.4 g/dL — ABNORMAL LOW (ref 12.0–15.0)
MCH: 28.8 pg (ref 26.0–34.0)
MCH: 30.1 pg (ref 26.0–34.0)
MCHC: 32.4 g/dL (ref 30.0–36.0)
MCHC: 33 g/dL (ref 30.0–36.0)
MCV: 88.9 fL (ref 78.0–100.0)
MCV: 91.3 fL (ref 78.0–100.0)
PLATELETS: 90 10*3/uL — AB (ref 150–400)
PLATELETS: 93 10*3/uL — AB (ref 150–400)
RBC: 3.16 MIL/uL — AB (ref 3.87–5.11)
RBC: 3.12 MIL/uL — ABNORMAL LOW (ref 3.87–5.11)
RDW: 17 % — ABNORMAL HIGH (ref 11.5–15.5)
RDW: 17.1 % — ABNORMAL HIGH (ref 11.5–15.5)
WBC: 4.9 10*3/uL (ref 4.0–10.5)
WBC: 5.4 10*3/uL (ref 4.0–10.5)

## 2014-07-26 LAB — COMPREHENSIVE METABOLIC PANEL
ALBUMIN: 2.9 g/dL — AB (ref 3.5–5.2)
ALT: 8 U/L (ref 0–35)
ANION GAP: 17 — AB (ref 5–15)
AST: 17 U/L (ref 0–37)
Alkaline Phosphatase: 64 U/L (ref 39–117)
BUN: 78 mg/dL — ABNORMAL HIGH (ref 6–23)
CO2: 23 mEq/L (ref 19–32)
CREATININE: 3 mg/dL — AB (ref 0.50–1.10)
Calcium: 8.9 mg/dL (ref 8.4–10.5)
Chloride: 103 mEq/L (ref 96–112)
GFR calc Af Amer: 16 mL/min — ABNORMAL LOW (ref >90)
GFR calc non Af Amer: 14 mL/min — ABNORMAL LOW (ref >90)
Glucose, Bld: 102 mg/dL — ABNORMAL HIGH (ref 70–99)
Potassium: 3.9 mEq/L (ref 3.7–5.3)
Sodium: 143 mEq/L (ref 137–147)
TOTAL PROTEIN: 6.3 g/dL (ref 6.0–8.3)
Total Bilirubin: 2.7 mg/dL — ABNORMAL HIGH (ref 0.3–1.2)

## 2014-07-26 LAB — GLUCOSE, CAPILLARY: Glucose-Capillary: 102 mg/dL — ABNORMAL HIGH (ref 70–99)

## 2014-07-26 LAB — MAGNESIUM: MAGNESIUM: 1.8 mg/dL (ref 1.5–2.5)

## 2014-07-26 LAB — AMMONIA: AMMONIA: 48 umol/L (ref 11–60)

## 2014-07-26 LAB — PROTIME-INR
INR: 1.89 — ABNORMAL HIGH (ref 0.00–1.49)
PROTHROMBIN TIME: 21.9 s — AB (ref 11.6–15.2)

## 2014-07-26 LAB — PHOSPHORUS: PHOSPHORUS: 3.2 mg/dL (ref 2.3–4.6)

## 2014-07-26 MED ORDER — WARFARIN - PHARMACIST DOSING INPATIENT: Freq: Every day | Status: DC

## 2014-07-26 MED ORDER — WARFARIN SODIUM 3 MG PO TABS
3.0000 mg | ORAL_TABLET | Freq: Once | ORAL | Status: DC
  Filled 2014-07-26: qty 1

## 2014-07-26 NOTE — Progress Notes (Signed)
Informed pts son, Lawson Radar, of pt transfer to [phone].

## 2014-07-26 NOTE — Progress Notes (Signed)
[facility]  HAJRA PORT GOT:[id number] DOB: 26-Aug-1934 DOA: 07/22/2014 PCP: Tamsen Roers, MD  Brief narrative: 79 year old female patient with atrial fibrillation on Coumadin, known CAD, known multiple GI disorders including prior benign gastric polyp, GI bleed secondary to gastric ulcers after NSAIDs use, as well as esophageal dysmotility, hypertension, and chronic kidney disease who presented to the ER after her family found out she had 2 bloody bowel movements and an episode of coffee-ground emesis. Patient denied abdominal pain.  In the ER the patient was found to have a supratherapeutic INR level of 6.23. She was also thrombocytopenic with a platelet count of 128,000 but she does have a degree of chronic thrombocytopenia with readings anywhere between 113k and 139k in the past few months. In the ER she was given vitamin K and 2 units of FFP.  Since her admission she has undergone an EGD which revealed only an esoph stricture.  At this time the plan is to cont to watch her Hgb, and to resume coumadin anticoag once Hgb is stable.  She will need to be watched post anticoag resump to assure she does not rebleed.    HPI/Subjective: No new complaints today.  Has very poor appetite w/ poor intake.  Onoing hip/back pain related to ulceration/wound.   Assessment/Plan:  Acute GI bleed / history of Gastric ulcer (2008) Likely influenced by supratherapeutic INR - continue Protonix - GI consulted - EGD 12/11 was unremarkable except for esophageal ring, no bleeding seen - BUN trending downward - bowel movements have transitioned from black to brown  Supratherapeutic INR / Long term current use of anticoagulant therapy given vitamin K and 2 units of FFP at time of admission - additional 2 units FFP on 12/09 and post transfusion INR was 2.35 - 12/10 was given an additional 2 units FFP and another dose of Vitamin K PO 5 mg x 1 - INR now <1.0    Acute blood  loss anemia Baseline hemoglobin around 13 - Hemoglobin at presentation 9.2 - nadir 7.3 - transfused 2 units packed red blood cells on 12/9 - Hgb appears to be declining so will cont to follow - resume anticoag as soon as Hgb proven to be stable  Pancytopenia chronic thrombocytopenia with platelets now lower than typical range - suspect acute drop due to consumption - white count is also variable and low at times - haptoglobin not c/w hemolysis  Abnormal UA UA c/w UTI but urine cx did not prove to be helpful - asymptomatic w/o abx tx  Hypokalemia K+ and Mg normal today   Increased ammonia level Has now normalized - ?due to GI blood breakdown   Stage 3 left ischial decubitus cont preadmit wound care - oxycodone prn pain   CAD Asymptomatic - keep hemoglobin greater than or equal to 8.0  Essential hypertension BP currently well controlled   Campath-induced atrial fibrillation AV sequentially paced - was on low-dose aspirin and warfarin prior to admission - both on hold for now   CKD stage 4, GFR 15-29 ml/min Baseline renal function BUN 59 and creatinine 3.47 - BUN continues to improve, and crt is better than her baseline   Esophageal dysmotility / strictures  Has required multiple dilatations in the past - no evidence of complications at present   Hyperthyroidism Previously intolerant to PTU and apparently post-ablation since preadmission meds included Synthroid -TSH 0.365  HLD  On Mevacor prior to admission  Pulmonary hypertension with Chronic right-sided  heart failure and Severe tricuspid regurgitation Currently stable on room air - because of GI bleeding and suboptimal blood pressures usual Lasix and Zaroxolyn were initially held as well as Apresoline and Imdur   DVT prophylaxis: SCDs Code Status: DO NOT RESUSCITATE Family Communication: No family at bedside Disposition Plan/Expected LOS: transfer to med bed - resume coumadin if Hgb stable on recheck today - follow on  resumed coumadin as high risk for rebleed - ultimately for d/c to SNF   Consultants: Gastroenterology  Procedures: EGD: Normal except for esophageal ring  Antibiotics: None  Objective: Blood pressure 137/41, pulse 60, temperature 98.5 F (36.9 C), temperature source Oral, resp. rate 14, height 5\' 7"  (1.702 m), weight 86.3 kg (190 lb 4.1 oz), SpO2 97 %.  Intake/Output Summary (Last 24 hours) at 07/26/14 1208 Last data filed at 07/26/14 1035  Gross per 24 hour  Intake   1475 ml  Output    875 ml  Net    600 ml   Exam: Gen: No acute respiratory distress Chest: Clear to auscultation bilaterally, room air Cardiac: regular rate - no gallup or rub - no appreciable rub or gallup  Abdomen: Soft nontender nondistended without obvious hepatosplenomegaly, no ascites Extremities: no signif c/c/edema B LE   Scheduled Meds:  Scheduled Meds: . hydrALAZINE  25 mg Oral TID  . isosorbide mononitrate  120 mg Oral Daily  . levothyroxine  100 mcg Oral QAC breakfast  . mometasone-formoterol  2 puff Inhalation BID  . pantoprazole  40 mg Oral Q1200  . silver sulfADIAZINE  1 application Topical Daily  . sodium chloride  3 mL Intravenous Q12H  . tiotropium  18 mcg Inhalation Daily   Data Reviewed: Basic Metabolic Panel:  Recent Labs Lab 07/22/14 1247 07/23/14 0220 07/24/14 0403 07/25/14 0317 07/25/14 1625 07/26/14 0302  NA 141 142 139 142  --  143  K 4.7 4.3 3.4* 3.3*  --  3.9  CL 100 101 98 99  --  103  CO2 25 24 24 24   --  23  GLUCOSE 95 89 86 93  --  102*  BUN 102* 97* 91* 85*  --  78*  CREATININE 3.64* 3.55* 3.50* 3.20*  --  3.00*  CALCIUM 9.2 9.0 9.2 9.3  --  8.9  MG  --   --   --   --  1.8 1.8  PHOS  --   --   --   --  3.7 3.2   Liver Function Tests:  Recent Labs Lab 07/22/14 1247 07/24/14 0403 07/26/14 0302  AST 21 18 17   ALT 8 10 8   ALKPHOS 65 63 64  BILITOT 1.8* 1.8* 2.7*  PROT 6.6 6.7 6.3  ALBUMIN 2.9* 3.0* 2.9*    Recent Labs Lab 07/22/14 1247  LIPASE  26    Recent Labs Lab 07/22/14 2219 07/25/14 1620 07/26/14 0302  AMMONIA 103* 71* 48   CBC:  Recent Labs Lab 07/22/14 1247  07/24/14 0403 07/24/14 1913 07/25/14 0317 07/25/14 1625 07/26/14 0302  WBC 4.6  < > 2.9* 4.0 4.6 5.0 4.9  NEUTROABS 3.5  --   --   --   --   --   --   HGB 9.2*  < > 9.9* 9.7* 9.7* 9.9* 9.1*  HCT 28.4*  < > 29.7* 29.4* 29.8* 30.0* 28.1*  MCV 88.8  < > 89.2 88.3 88.7 89.0 88.9  PLT 128*  < > 83* 88* 87* 85* 90*  < > = values in  this interval not displayed.  Recent Results (from the past 240 hour(s))  MRSA PCR Screening     Status: None   Collection Time: 07/22/14 10:09 PM  Result Value Ref Range Status   MRSA by PCR NEGATIVE NEGATIVE Final    Comment:        The GeneXpert MRSA Assay (FDA approved for NASAL specimens only), is one component of a comprehensive MRSA colonization surveillance program. It is not intended to diagnose MRSA infection nor to guide or monitor treatment for MRSA infections.   Urine culture     Status: None   Collection Time: 07/24/14  9:44 AM  Result Value Ref Range Status   Specimen Description URINE, RANDOM  Final   Special Requests NONE  Final   Culture  Setup Time   Final    07/24/2014 10:19 Performed at [facility]   Final    >=100,000 COLONIES/ML Performed at [facility]    Culture   Final    Multiple bacterial morphotypes present, none predominant. Suggest appropriate recollection if clinically indicated. Performed at [facility]    Report Status 07/25/2014 FINAL  Final     Studies:  Recent x-ray studies have been reviewed in detail by the Attending Physician  Time spent : 82 mins   Cherene Altes, MD [facility] For Consults/Admissions - Flow Manager - [phone] Office  [phone] Pager [phone]  On-Call/Text Page:      [email or web address]      password [facility]   07/26/2014, 12:08 PM   LOS: 4 days

## 2014-07-26 NOTE — Evaluation (Signed)
Physical Therapy Evaluation Patient Details Name: [name] MRN: [id number] DOB: 07-29-1935 Today's Date: 07/26/2014   History of Present Illness  79 year old female patient with atrial fibrillation on Coumadin, known CAD, known multiple GI disorders including prior benign gastric polyp, GI bleed secondary to gastric ulcers after NSAIDs use, as well as esophageal dysmotility, hypertension, and chronic kidney disease who presented to the ER after her family found out she had had 2 bloody bowel movements and an episode of coffee-ground emesis. Patient denied abdominal pain.Marland Kitchen  Pt found to have supra therapeutic INR of over 6  Clinical Impression  Pt admitted with/for GIB.  Pt currently limited functionally due to the problems listed. ( See problems list.)  It sounds like pt is very sedentary at home and mobilizes very little at a time and with assist.  Pt likely needs to get reconditioned at [facility] rehab before back home.   Pt will benefit from PT to maximize function and safety in order to get ready for next venue listed below.     Follow Up Recommendations SNF (24 hour assist if family insists on taking her home.)    Equipment Recommendations  None recommended by PT    Recommendations for Other Services       Precautions / Restrictions Precautions Precautions: Fall Restrictions Weight Bearing Restrictions: No      Mobility  Bed Mobility Overal bed mobility: Needs Assistance Bed Mobility: Supine to Sit     Supine to sit: Mod assist     General bed mobility comments: cues for hand placement and assist to come forward and scoot  Transfers Overall transfer level: Needs assistance   Transfers: Sit to/from Stand;Stand Pivot Transfers Sit to Stand: Mod assist;Min assist (depending on surface) Stand pivot transfers: Mod assist       General transfer comment: cues for hand placement  Ambulation/Gait Ambulation/Gait assistance: Min assist Ambulation Distance (Feet): 25  Feet Assistive device: Rolling walker (2 wheeled) Gait Pattern/deviations: Step-through pattern;Step-to pattern;Decreased step length - right;Decreased step length - left;Decreased stride length;Shuffle;Trunk flexed Gait velocity: slow Gait velocity interpretation: Below normal speed for age/gender General Gait Details: unsteady throughout with flexed posture and variable focus due to back pain  Stairs            Wheelchair Mobility    Modified Rankin (Stroke Patients Only)       Balance Overall balance assessment: Needs assistance Sitting-balance support: Feet supported;No upper extremity supported Sitting balance-Leahy Scale: Fair     Standing balance support: Bilateral upper extremity supported Standing balance-Leahy Scale: Poor                               Pertinent Vitals/Pain Pain Assessment: Faces Faces Pain Scale: Hurts whole lot Pain Location: back on left Pain Descriptors / Indicators: Aching;Burning;Constant ("when I lay in bed too much) Pain Intervention(s): Limited activity within patient's tolerance;Monitored during session;Repositioned    Home Living Family/patient expects to be discharged to:: Private residence Living Arrangements: Children Available Help at Discharge: Family;Available 24 hours/day Type of Home: House Home Access: Stairs to enter Entrance Stairs-Rails: Psychiatric nurse of Steps: 2 Home Layout: One level Home Equipment: Walker - 2 wheels;Bedside commode;Other (comment) (hovearound) Additional Comments: Has slept alot lately; ambulates short distance only    Prior Function           Comments: pt reports that someone walks with her everywhere     Hand Dominance  Extremity/Trunk Assessment   Upper Extremity Assessment: Generalized weakness;Overall [facility] for tasks assessed           Lower Extremity Assessment: Generalized weakness      Cervical / Trunk Assessment: Kyphotic   Communication   Communication: No difficulties  Cognition Arousal/Alertness: Awake/alert Behavior During Therapy: WFL for tasks assessed/performed Overall Cognitive Status: Within Functional Limits for tasks assessed                      General Comments      Exercises        Assessment/Plan    PT Assessment Patient needs continued PT services  PT Diagnosis Difficulty walking;Acute pain;Generalized weakness   PT Problem List Decreased strength;Decreased activity tolerance;Decreased mobility;Decreased knowledge of use of DME;Decreased safety awareness;Decreased knowledge of precautions;Pain  PT Treatment Interventions DME instruction;Gait training;Functional mobility training;Stair training;Therapeutic activities;Patient/family education   PT Goals (Current goals can be found in the Care Plan section) Acute Rehab PT Goals Patient Stated Goal: not stated PT Goal Formulation: With patient Time For Goal Achievement: 08/09/14 Potential to Achieve Goals: Fair    Frequency Min 3X/week   Barriers to discharge        Co-evaluation               End of Session   Activity Tolerance: Patient limited by fatigue;Patient limited by pain Patient left: in chair;with call bell/phone within reach Nurse Communication: Mobility status         Time: 1021-1051 PT Time Calculation (min) (ACUTE ONLY): 30 min   Charges:   PT Evaluation $Initial PT Evaluation Tier I: 1 Procedure PT Treatments $Gait Training: 8-22 mins $Therapeutic Activity: 8-22 mins   PT G Codes:          Marquerite Forsman, Tessie Fass 07/26/2014, 11:08 AM 07/26/2014  Donnella Sham, PT [phone]  (pager)

## 2014-07-26 NOTE — Progress Notes (Signed)
Report given to [facility] on 6N.

## 2014-07-26 NOTE — Progress Notes (Signed)
ANTICOAGULATION CONSULT NOTE - Initial Consult  Pharmacy Consult for Coumadin Indication: atrial fibrillation  Allergies  Allergen Reactions  . Amiodarone Hives  . Propylthiouracil Rash    Patient Measurements: Height: 5\' 7"  (170.2 cm) Weight: 190 lb 4.1 oz (86.3 kg) IBW/kg (Calculated) : 61.6  Vital Signs: Temp: 97.5 F (36.4 C) (12/12 1508) Temp Source: Oral (12/12 1508) BP: 132/51 mmHg (12/12 1205) Pulse Rate: 71 (12/12 1205)  Labs:  Recent Labs  07/24/14 0403  07/25/14 0317 07/25/14 1625 07/26/14 0302 07/26/14 1548  HGB 9.9*  < > 9.7* 9.9* 9.1* 9.4*  HCT 29.7*  < > 29.8* 30.0* 28.1* 28.5*  PLT 83*  < > 87* 85* 90* PENDING  LABPROT 26.0*  --  25.1*  --  21.9*  --   INR 2.35*  --  2.26*  --  1.89*  --   CREATININE 3.50*  --  3.20*  --  3.00*  --   < > = values in this interval not displayed.  Estimated Creatinine Clearance: 17.7 mL/min (by C-G formula based on Cr of 3).   Medical History: Past Medical History  Diagnosis Date  . Cellulitis and abscess of unspecified site     a. h/o MSRA cellulitis of abdominal wall.  Marland Kitchen CAD (coronary artery disease)     a. DES to OM 01/2005. b. Rotational atherotomy/DES to prox LAD 02/2008. c. Cath 07/2013: stable, for med rx.  . Edema   . Warfarin anticoagulation     a. Followed by PCP.  Marland Kitchen GI bleed     a. EGD 01/2007: antral ulcers related to NSAID/aspirin use.  . Peripheral vascular disease   . Hyperthyroidism     a. Hx of intolerance to PTU.  Marland Kitchen Blood transfusion years ago    "heart problems" (09/26/2013)  . H/O hiatal hernia   . Umbilical hernia     unrepaired (11/24/11)  . Valvular heart disease     Echo 11/2011: mild-mod MR.  Marland Kitchen Hypertension     takes Amlodipine,Hydralazine,and Imdur daily  . Hyperlipidemia     takes Lovastatin daily  . SSS (sick sinus syndrome)     a. H/o AV node ablation 2008 secondary to difficult to control afib/flutter, with subsequent pacer.  . Atrial fibrillation with rapid ventricular  response     a. H/o amiodarone thyroiditis. b. s/p AV node ablation 2008 with implantation of PPM 01/2007, upgraded to CRT-P 06/2007.;takes Coumadin daily  . GERD (gastroesophageal reflux disease)     takes Protonix daily  . Chronic diastolic CHF (congestive heart failure)     takes Lasix daily and Spironolactone   . Pacemaker     [employer]tr[employer]  . History of gout   . Hemorrhoids   . Depression with anxiety     takes Xanax daily  . Insomnia     takes Xanax if needed  . Heart murmur   . CKD (chronic kidney disease)     Cr ~2.3.  . Kidney stones   . Myocardial infarction     "I've had 2-3; last one was in /1996" (09/26/2013)  . Migraines     "last one was a pretty good while ago" (09/26/2013)  . Arthritis     "all over" (09/26/2013)  . Chronic lower back pain     deteriorating disc  . Iron deficiency anemia     takes an Iron Pill daily  . Pneumonia     Assessment: 79 year old female on Coumadin PTA for Afib.  On admission (  12/8) the patient was found to have a supra-therapeutic INR of 6.23 and was given vitamin K (10 mg po total).  Since admission she has undergone EGD which revealed only an esophageal stricture.  The plan now is to watch her HgB and to resume Coumadin  Coumadin dose PTA was 3 mg on Monday and Fridays, 2 mg on other days  Goal of Therapy:  INR 2-3 Monitor platelets by anticoagulation protocol: Yes   Plan:  Coumadin 3 mg po x 1 dose tonight Follow daily INR  Thank you. Anette Guarneri, PharmD [phone]  07/26/2014,5:20 PM

## 2014-07-27 DIAGNOSIS — N186 End stage renal disease: Secondary | ICD-10-CM

## 2014-07-27 DIAGNOSIS — I10 Essential (primary) hypertension: Secondary | ICD-10-CM

## 2014-07-27 LAB — CBC
HCT: 30.5 % — ABNORMAL LOW (ref 36.0–46.0)
Hemoglobin: 9.8 g/dL — ABNORMAL LOW (ref 12.0–15.0)
MCH: 28.9 pg (ref 26.0–34.0)
MCHC: 32.1 g/dL (ref 30.0–36.0)
MCV: 90 fL (ref 78.0–100.0)
Platelets: 104 10*3/uL — ABNORMAL LOW (ref 150–400)
RBC: 3.39 MIL/uL — ABNORMAL LOW (ref 3.87–5.11)
RDW: 17.5 % — ABNORMAL HIGH (ref 11.5–15.5)
WBC: 6.8 10*3/uL (ref 4.0–10.5)

## 2014-07-27 LAB — BASIC METABOLIC PANEL
ANION GAP: 19 — AB (ref 5–15)
BUN: 75 mg/dL — ABNORMAL HIGH (ref 6–23)
CALCIUM: 9.2 mg/dL (ref 8.4–10.5)
CO2: 22 mEq/L (ref 19–32)
Chloride: 99 mEq/L (ref 96–112)
Creatinine, Ser: 2.87 mg/dL — ABNORMAL HIGH (ref 0.50–1.10)
GFR calc Af Amer: 17 mL/min — ABNORMAL LOW (ref >90)
GFR, EST NON AFRICAN AMERICAN: 15 mL/min — AB (ref >90)
Glucose, Bld: 107 mg/dL — ABNORMAL HIGH (ref 70–99)
Potassium: 3.8 mEq/L (ref 3.7–5.3)
SODIUM: 140 meq/L (ref 137–147)

## 2014-07-27 LAB — PROTIME-INR
INR: 1.75 — AB (ref 0.00–1.49)
Prothrombin Time: 20.6 seconds — ABNORMAL HIGH (ref 11.6–15.2)

## 2014-07-27 MED ORDER — WARFARIN SODIUM 4 MG PO TABS
4.0000 mg | ORAL_TABLET | Freq: Once | ORAL | Status: AC
  Administered 2014-07-27: 4 mg via ORAL
  Filled 2014-07-27: qty 1

## 2014-07-27 NOTE — Progress Notes (Signed)
Progress Note  [name]:[id number] DOB: 06-22-1935 DOA: 07/22/2014 PCP: Tamsen Roers, MD  Brief narrative: 79 year old female patient with atrial fibrillation on Coumadin, known CAD, known multiple GI disorders including prior benign gastric polyp, GI bleed secondary to gastric ulcers after NSAIDs use, as well as esophageal dysmotility, hypertension, and chronic kidney disease who presented to the ER after her family found out she had 2 bloody bowel movements and an episode of coffee-ground emesis. Patient denied abdominal pain.  In the ER the patient was found to have a supratherapeutic INR level of 6.23. She was also thrombocytopenic with a platelet count of 128,000 but she does have a degree of chronic thrombocytopenia with readings anywhere between 113k and 139k in the past few months. In the ER she was given vitamin K and 2 units of FFP.  Since her admission she has undergone an EGD which revealed only an esoph stricture.  At this time the plan is to cont to watch her Hgb, and to resume coumadin anticoag once Hgb is stable.  She will need to be watched post anticoag resump to assure she does not rebleed.    HPI/Subjective: No complaint, atorvastatin PT/OT to evaluate  Assessment/Plan:  Acute GI bleed / history of Gastric ulcer (2008) Likely influenced by supratherapeutic INR - continue Protonix. GI consulted - EGD 12/11 was unremarkable except for esophageal ring, no bleeding seen BUN trending downward - bowel movements have transitioned from black to brown Last bowel movement was the day before yesterday.  Supratherapeutic INR / Long term current use of anticoagulant therapy Given vitamin K and 2 units of FFP at time of admission - additional 2 units FFP on 12/09 and post transfusion INR was 2.35 12/10 was given an additional 2 units FFP and another dose of Vitamin K PO 5 mg x 1. INR currently is 1.75, Coumadin restarted.    Acute blood loss anemia Baseline  hemoglobin around 13 - Hemoglobin at presentation 9.2 - nadir 7.3. Transfused 2 units packed red blood cells on 12/9. Hemoglobin is stable after anticoagulation resumed.  Pancytopenia Chronic thrombocytopenia with platelets now lower than typical range - suspect acute drop due to consumption - white count is also variable and low at times - haptoglobin not c/w hemolysis Patient also has hyperbilirubinemia, hypoalbuminemia, hyperammonemia, cannot rule out cirrhosis secondary to congestive hepatopathy secondary to right-sided heart failure  Abnormal UA UA c/w UTI but urine cx did not prove to be helpful - asymptomatic w/o abx tx  Hypokalemia K+ and Mg normal today   Increased ammonia level Has now normalized - ?due to GI blood breakdown   Stage 3 left ischial decubitus cont preadmit wound care - oxycodone prn pain   CAD Asymptomatic - keep hemoglobin greater than or equal to 8.0  Essential hypertension BP currently well controlled   Campath-induced atrial fibrillation AV sequentially paced - was on low-dose aspirin and warfarin prior to admission - both on hold for now   CKD stage 4, GFR 15-29 ml/min Baseline renal function BUN 59 and creatinine 3.47 - BUN continues to improve, and crt is better than her baseline   Esophageal dysmotility / strictures  Has required multiple dilatations in the past - no evidence of complications at present   Hyperthyroidism Previously intolerant to PTU and apparently post-ablation since preadmission meds included Synthroid -TSH 0.365  HLD  On Mevacor prior to admission  Pulmonary hypertension with Chronic right-sided heart failure and Severe tricuspid regurgitation Currently stable on room air -  because of GI bleeding and suboptimal blood pressures usual Lasix and Zaroxolyn were initially held as well as Apresoline and Imdur.   DVT prophylaxis: SCDs Code Status: DO NOT RESUSCITATE Family Communication: No family at bedside Disposition  Plan/Expected LOS: transfer to med bed - resume coumadin if Hgb stable on recheck today - follow on resumed coumadin as high risk for rebleed - ultimately for d/c to SNF   Consultants: Gastroenterology  Procedures: EGD: Normal except for esophageal ring  Antibiotics: None  Objective: Blood pressure 147/56, pulse 60, temperature 98.2 F (36.8 C), temperature source Oral, resp. rate 17, height 5\' 7"  (1.702 m), weight 86.3 kg (190 lb 4.1 oz), SpO2 99 %.  Intake/Output Summary (Last 24 hours) at 07/27/14 1302 Last data filed at 07/27/14 1006  Gross per 24 hour  Intake    508 ml  Output    230 ml  Net    278 ml   Exam: Gen: No acute respiratory distress Chest: Clear to auscultation bilaterally, room air Cardiac: regular rate - no gallup or rub - no appreciable rub or gallup  Abdomen: Soft nontender nondistended without obvious hepatosplenomegaly, no ascites Extremities: no signif c/c/edema B LE   Scheduled Meds:  Scheduled Meds: . hydrALAZINE  25 mg Oral TID  . isosorbide mononitrate  120 mg Oral Daily  . levothyroxine  100 mcg Oral QAC breakfast  . mometasone-formoterol  2 puff Inhalation BID  . pantoprazole  40 mg Oral Q1200  . silver sulfADIAZINE  1 application Topical Daily  . sodium chloride  3 mL Intravenous Q12H  . tiotropium  18 mcg Inhalation Daily  . warfarin  4 mg Oral ONCE-1800  . Warfarin - Pharmacist Dosing Inpatient   Does not apply q1800   Data Reviewed: Basic Metabolic Panel:  Recent Labs Lab 07/23/14 0220 07/24/14 0403 07/25/14 0317 07/25/14 1625 07/26/14 0302 07/27/14 0500  NA 142 139 142  --  143 140  K 4.3 3.4* 3.3*  --  3.9 3.8  CL 101 98 99  --  103 99  CO2 24 24 24   --  23 22  GLUCOSE 89 86 93  --  102* 107*  BUN 97* 91* 85*  --  78* 75*  CREATININE 3.55* 3.50* 3.20*  --  3.00* 2.87*  CALCIUM 9.0 9.2 9.3  --  8.9 9.2  MG  --   --   --  1.8 1.8  --   PHOS  --   --   --  3.7 3.2  --    Liver Function Tests:  Recent Labs Lab  07/22/14 1247 07/24/14 0403 07/26/14 0302  AST 21 18 17   ALT 8 10 8   ALKPHOS 65 63 64  BILITOT 1.8* 1.8* 2.7*  PROT 6.6 6.7 6.3  ALBUMIN 2.9* 3.0* 2.9*    Recent Labs Lab 07/22/14 1247  LIPASE 26    Recent Labs Lab 07/22/14 2219 07/25/14 1620 07/26/14 0302  AMMONIA 103* 71* 48   CBC:  Recent Labs Lab 07/22/14 1247  07/25/14 0317 07/25/14 1625 07/26/14 0302 07/26/14 1548 07/27/14 0640  WBC 4.6  < > 4.6 5.0 4.9 5.4 6.8  NEUTROABS 3.5  --   --   --   --   --   --   HGB 9.2*  < > 9.7* 9.9* 9.1* 9.4* 9.8*  HCT 28.4*  < > 29.8* 30.0* 28.1* 28.5* 30.5*  MCV 88.8  < > 88.7 89.0 88.9 91.3 90.0  PLT 128*  < > 87*  85* 90* 93* 104*  < > = values in this interval not displayed.  Recent Results (from the past 240 hour(s))  MRSA PCR Screening     Status: None   Collection Time: 07/22/14 10:09 PM  Result Value Ref Range Status   MRSA by PCR NEGATIVE NEGATIVE Final    Comment:        The GeneXpert MRSA Assay (FDA approved for NASAL specimens only), is one component of a comprehensive MRSA colonization surveillance program. It is not intended to diagnose MRSA infection nor to guide or monitor treatment for MRSA infections.   Urine culture     Status: None   Collection Time: 07/24/14  9:44 AM  Result Value Ref Range Status   Specimen Description URINE, RANDOM  Final   Special Requests NONE  Final   Culture  Setup Time   Final    07/24/2014 10:19 Performed at [facility]   Final    >=100,000 COLONIES/ML Performed at [facility]    Culture   Final    Multiple bacterial morphotypes present, none predominant. Suggest appropriate recollection if clinically indicated. Performed at [facility]    Report Status 07/25/2014 FINAL  Final     Studies:  Recent x-ray studies have been reviewed in detail by the Attending Physician  Time spent : 16 mins   Cherene Altes, MD [facility] For Consults/Admissions -  Flow Manager - [phone] Office  [phone] Pager [phone]  On-Call/Text Page:      [email or web address]      password [facility]   07/27/2014, 1:02 PM   LOS: 5 days

## 2014-07-27 NOTE — Progress Notes (Signed)
[facility] for Coumadin Indication: atrial fibrillation  Allergies  Allergen Reactions  . Amiodarone Hives  . Propylthiouracil Rash    Patient Measurements: Height: 5\' 7"  (170.2 cm) Weight: 190 lb 4.1 oz (86.3 kg) IBW/kg (Calculated) : 61.6  Vital Signs: Temp: 98.2 F (36.8 C) (12/13 0539) Temp Source: Oral (12/13 0539) BP: 147/56 mmHg (12/13 0539) Pulse Rate: 60 (12/13 0539)  Labs:  Recent Labs  07/25/14 0317  07/26/14 0302 07/26/14 1548 07/27/14 0500 07/27/14 0640  HGB 9.7*  < > 9.1* 9.4*  --  9.8*  HCT 29.8*  < > 28.1* 28.5*  --  30.5*  PLT 87*  < > 90* 93*  --  104*  LABPROT 25.1*  --  21.9*  --   --  20.6*  INR 2.26*  --  1.89*  --   --  1.75*  CREATININE 3.20*  --  3.00*  --  2.87*  --   < > = values in this interval not displayed.  Estimated Creatinine Clearance: 18.5 mL/min (by C-G formula based on Cr of 2.87).  Assessment: 79 year old female on Coumadin PTA for Afib.  On admission (12/8) the patient was found to have a supra-therapeutic INR of 6.23 and was given vitamin K (10 mg po total).  Since admission she has undergone EGD which revealed only an esophageal stricture.  The plan now is to watch her HgB and to resume Coumadin Warfarin was to resume last evening- a dose was ordered and note was written by RPh, however dose was NOT charted as given. INR this morning 1.75. Hgb 9.8, platelets improving. No overt bleeding noted.  Coumadin dose PTA was 3 mg on Monday and Fridays, 2 mg on other days  Goal of Therapy:  INR 2-3 Monitor platelets by anticoagulation protocol: Yes   Plan:  1. warfarin 4mg  po x 1 dose tonight 2. daily INR 3. Follow for s/s bleeding  Sven Pinheiro D. Steffanie Mingle, PharmD, BCPS Clinical Pharmacist Pager: [phone] 07/27/2014 11:07 AM

## 2014-07-28 ENCOUNTER — Encounter (HOSPITAL_COMMUNITY): Payer: Self-pay | Admitting: Internal Medicine

## 2014-07-28 LAB — CBC
HCT: 29.6 % — ABNORMAL LOW (ref 36.0–46.0)
Hemoglobin: 9.4 g/dL — ABNORMAL LOW (ref 12.0–15.0)
MCH: 28.7 pg (ref 26.0–34.0)
MCHC: 31.8 g/dL (ref 30.0–36.0)
MCV: 90.5 fL (ref 78.0–100.0)
PLATELETS: 116 10*3/uL — AB (ref 150–400)
RBC: 3.27 MIL/uL — ABNORMAL LOW (ref 3.87–5.11)
RDW: 17.5 % — ABNORMAL HIGH (ref 11.5–15.5)
WBC: 6.7 10*3/uL (ref 4.0–10.5)

## 2014-07-28 LAB — COMPREHENSIVE METABOLIC PANEL
ALT: 6 U/L (ref 0–35)
AST: 12 U/L (ref 0–37)
Albumin: 2.6 g/dL — ABNORMAL LOW (ref 3.5–5.2)
Alkaline Phosphatase: 61 U/L (ref 39–117)
Anion gap: 17 — ABNORMAL HIGH (ref 5–15)
BUN: 76 mg/dL — ABNORMAL HIGH (ref 6–23)
CALCIUM: 9.2 mg/dL (ref 8.4–10.5)
CO2: 22 mEq/L (ref 19–32)
Chloride: 98 mEq/L (ref 96–112)
Creatinine, Ser: 2.82 mg/dL — ABNORMAL HIGH (ref 0.50–1.10)
GFR, EST AFRICAN AMERICAN: 18 mL/min — AB (ref >90)
GFR, EST NON AFRICAN AMERICAN: 15 mL/min — AB (ref >90)
GLUCOSE: 95 mg/dL (ref 70–99)
Potassium: 3.9 mEq/L (ref 3.7–5.3)
SODIUM: 137 meq/L (ref 137–147)
TOTAL PROTEIN: 6.2 g/dL (ref 6.0–8.3)
Total Bilirubin: 4 mg/dL — ABNORMAL HIGH (ref 0.3–1.2)

## 2014-07-28 LAB — PREPARE FRESH FROZEN PLASMA
UNIT DIVISION: 0
UNIT DIVISION: 0

## 2014-07-28 LAB — PROTIME-INR
INR: 2.1 — ABNORMAL HIGH (ref 0.00–1.49)
PROTHROMBIN TIME: 23.7 s — AB (ref 11.6–15.2)

## 2014-07-28 MED ORDER — WARFARIN SODIUM 1 MG PO TABS
1.0000 mg | ORAL_TABLET | Freq: Once | ORAL | Status: AC
  Administered 2014-07-28: 1 mg via ORAL
  Filled 2014-07-28: qty 1

## 2014-07-28 NOTE — Progress Notes (Signed)
Progress Note  [name] MOQ:[id number] DOB: 20-May-1935 DOA: 07/22/2014 PCP: Tamsen Roers, MD  Brief narrative: 79 year old female patient with atrial fibrillation on Coumadin, known CAD, known multiple GI disorders including prior benign gastric polyp, GI bleed secondary to gastric ulcers after NSAIDs use, as well as esophageal dysmotility, hypertension, and chronic kidney disease who presented to the ER after her family found out she had 2 bloody bowel movements and an episode of coffee-ground emesis. Patient denied abdominal pain.  In the ER the patient was found to have a supratherapeutic INR level of 6.23. She was also thrombocytopenic with a platelet count of 128,000 but she does have a degree of chronic thrombocytopenia with readings anywhere between 113k and 139k in the past few months. In the ER she was given vitamin K and 2 units of FFP.  Since her admission she has undergone an EGD which revealed only an esoph stricture.  At this time the plan is to cont to watch her Hgb, and to resume coumadin anticoag once Hgb is stable.  She will need to be watched post anticoag resump to assure she does not rebleed.    HPI/Subjective: Feels tired and weak, denies any bowel movement for the past 2-3 days.  Assessment/Plan:  Acute GI bleed / history of Gastric ulcer (2008) Likely influenced by supratherapeutic INR - continue Protonix. GI consulted - EGD 12/11 was unremarkable except for esophageal ring, no bleeding seen BUN trending downward - bowel movements have transitioned from black to brown Last bowel movement was the day before yesterday.  Supratherapeutic INR / Long term current use of anticoagulant therapy Given vitamin K and 2 units of FFP at time of admission - additional 2 units FFP on 12/09 and post transfusion INR was 2.35 12/10 was given an additional 2 units FFP and another dose of Vitamin K PO 5 mg x 1. INR is currently 2.01 Coumadin per pharmacy.    Acute blood  loss anemia Baseline hemoglobin around 13 - Hemoglobin at presentation 9.2 - nadir 7.3. Transfused 2 units packed red blood cells on 12/9. Hemoglobin is stable after anticoagulation resumed.  Pancytopenia Chronic thrombocytopenia with platelets now lower than typical range - suspect acute drop due to consumption - white count is also variable and low at times - haptoglobin not c/w hemolysis Patient also has hyperbilirubinemia, hypoalbuminemia, hyperammonemia, cannot rule out cirrhosis secondary to congestive hepatopathy secondary to right-sided heart failure  Abnormal UA UA c/w UTI but urine cx did not prove to be helpful - asymptomatic w/o abx tx  Hypokalemia K+ and Mg normal today   Increased ammonia level Has now normalized - ?due to GI blood breakdown   Stage 3 left ischial decubitus cont preadmit wound care - oxycodone prn pain   CAD Asymptomatic - keep hemoglobin greater than or equal to 8.0  Essential hypertension BP currently well controlled   Campath-induced atrial fibrillation AV sequentially paced - was on low-dose aspirin and warfarin prior to admission - both on hold for now   CKD stage 4, GFR 15-29 ml/min Baseline renal function BUN 59 and creatinine 3.47 - BUN continues to improve, and crt is better than her baseline   Esophageal dysmotility / strictures  Has required multiple dilatations in the past - no evidence of complications at present   Hyperthyroidism Previously intolerant to PTU and apparently post-ablation since preadmission meds included Synthroid -TSH 0.365  HLD  On Mevacor prior to admission  Pulmonary hypertension with Chronic right-sided heart failure and  Severe tricuspid regurgitation Currently stable on room air - because of GI bleeding and suboptimal blood pressures usual Lasix and Zaroxolyn were initially held as well as Apresoline and Imdur.   DVT prophylaxis: SCDs Code Status: DO NOT RESUSCITATE Family Communication: No family at  bedside Disposition Plan/Expected LOS: transfer to med bed - resume coumadin if Hgb stable on recheck today - follow on resumed coumadin as high risk for rebleed - ultimately for d/c to SNF   Consultants: Gastroenterology  Procedures: EGD: Normal except for esophageal ring  Antibiotics: None  Objective: Blood pressure 153/49, pulse 62, temperature 97.6 F (36.4 C), temperature source Oral, resp. rate 15, height 5\' 7"  (1.702 m), weight 86.3 kg (190 lb 4.1 oz), SpO2 99 %.  Intake/Output Summary (Last 24 hours) at 07/28/14 1301 Last data filed at 07/28/14 0951  Gross per 24 hour  Intake    440 ml  Output      0 ml  Net    440 ml   Exam: Gen: No acute respiratory distress Chest: Clear to auscultation bilaterally, room air Cardiac: regular rate - no gallup or rub - no appreciable rub or gallup  Abdomen: Soft nontender nondistended without obvious hepatosplenomegaly, no ascites Extremities: no signif c/c/edema B LE   Scheduled Meds:  Scheduled Meds: . hydrALAZINE  25 mg Oral TID  . isosorbide mononitrate  120 mg Oral Daily  . levothyroxine  100 mcg Oral QAC breakfast  . mometasone-formoterol  2 puff Inhalation BID  . pantoprazole  40 mg Oral Q1200  . silver sulfADIAZINE  1 application Topical Daily  . sodium chloride  3 mL Intravenous Q12H  . tiotropium  18 mcg Inhalation Daily  . warfarin  1 mg Oral ONCE-1800  . Warfarin - Pharmacist Dosing Inpatient   Does not apply q1800   Data Reviewed: Basic Metabolic Panel:  Recent Labs Lab 07/24/14 0403 07/25/14 0317 07/25/14 1625 07/26/14 0302 07/27/14 0500 07/28/14 0444  NA 139 142  --  143 140 137  K 3.4* 3.3*  --  3.9 3.8 3.9  CL 98 99  --  103 99 98  CO2 24 24  --  23 22 22   GLUCOSE 86 93  --  102* 107* 95  BUN 91* 85*  --  78* 75* 76*  CREATININE 3.50* 3.20*  --  3.00* 2.87* 2.82*  CALCIUM 9.2 9.3  --  8.9 9.2 9.2  MG  --   --  1.8 1.8  --   --   PHOS  --   --  3.7 3.2  --   --    Liver Function Tests:  Recent  Labs Lab 07/22/14 1247 07/24/14 0403 07/26/14 0302 07/28/14 0444  AST 21 18 17 12   ALT 8 10 8 6   ALKPHOS 65 63 64 61  BILITOT 1.8* 1.8* 2.7* 4.0*  PROT 6.6 6.7 6.3 6.2  ALBUMIN 2.9* 3.0* 2.9* 2.6*    Recent Labs Lab 07/22/14 1247  LIPASE 26    Recent Labs Lab 07/22/14 2219 07/25/14 1620 07/26/14 0302  AMMONIA 103* 71* 48   CBC:  Recent Labs Lab 07/22/14 1247  07/25/14 1625 07/26/14 0302 07/26/14 1548 07/27/14 0640 07/28/14 0444  WBC 4.6  < > 5.0 4.9 5.4 6.8 6.7  NEUTROABS 3.5  --   --   --   --   --   --   HGB 9.2*  < > 9.9* 9.1* 9.4* 9.8* 9.4*  HCT 28.4*  < > 30.0* 28.1* 28.5* 30.5*  29.6*  MCV 88.8  < > 89.0 88.9 91.3 90.0 90.5  PLT 128*  < > 85* 90* 93* 104* 116*  < > = values in this interval not displayed.  Recent Results (from the past 240 hour(s))  MRSA PCR Screening     Status: None   Collection Time: 07/22/14 10:09 PM  Result Value Ref Range Status   MRSA by PCR NEGATIVE NEGATIVE Final    Comment:        The GeneXpert MRSA Assay (FDA approved for NASAL specimens only), is one component of a comprehensive MRSA colonization surveillance program. It is not intended to diagnose MRSA infection nor to guide or monitor treatment for MRSA infections.   Urine culture     Status: None   Collection Time: 07/24/14  9:44 AM  Result Value Ref Range Status   Specimen Description URINE, RANDOM  Final   Special Requests NONE  Final   Culture  Setup Time   Final    07/24/2014 10:19 Performed at [facility]   Final    >=100,000 COLONIES/ML Performed at [facility]    Culture   Final    Multiple bacterial morphotypes present, none predominant. Suggest appropriate recollection if clinically indicated. Performed at [facility]    Report Status 07/25/2014 FINAL  Final     Studies:  Recent x-ray studies have been reviewed in detail by the Attending Physician  Time spent : 4 mins   Cherene Altes,  MD [facility] For Consults/Admissions - Flow Manager - [phone] Office  [phone] Pager [phone]  On-Call/Text Page:      [email or web address] [email or web address]      password [facility]   07/28/2014, 1:01 PM   LOS: 6 days

## 2014-07-28 NOTE — Care Management Note (Addendum)
  Page 2 of 2   07/29/2014     1:47:08 PM CARE MANAGEMENT NOTE 07/29/2014  Patient:  [name], [name]   Account Number:  [id number]  Date Initiated:  07/24/2014  Documentation initiated by:  [name]  Subjective/Objective Assessment:   Pt admitted with GIB- plan for EGD 07/24/14 at 2pm     Action/Plan:   PTA pt lived at home-   Anticipated DC Date:  07/29/2014   Anticipated DC Plan:  [place]  CM consult      Choice offered to / List presented to:             Status of service:  In process, will continue to follow Medicare Important Message given?  YES (If response is "NO", the following Medicare IM given date fields will be blank) Date Medicare IM given:  07/25/2014 Medicare IM given by:  [name] Date Additional Medicare IM given:  07/28/2014 Additional Medicare IM given by:  [name]  Discharge Disposition:    Per UR Regulation:  Reviewed for med. necessity/level of care/duration of stay  If discussed at [place] of Stay Meetings, dates discussed:   07/29/2014    Comments:   07-29-14 Paged MD and left message for [facility] with [place] [facility]. Alexandria Spatz RN SBN 12-15 -15 Discussed home health again with son and patient and bedside . Patient is requiring a lot of assistance .  Patient has now agreed to go to short term SNF. SW aware. Alexandria Spatz RN BSN    07-29-14 Discussed discharge planning with patient and her son [name] at bedside . Patient wants to go home with home health she already has walker and bedside commode . [name] in agreement .  Address [place] , [place] [address] , phone [phone]  Paged MD for orders .  Patient would like [place] , referral called to [facility] they are checking staffing and will call back .  Alexandria Spatz RN SBN [phone]

## 2014-07-28 NOTE — Progress Notes (Addendum)
ANTICOAGULATION CONSULT NOTE - Follow Up Consult  Pharmacy Consult for Warfarin Indication: atrial fibrillation  Allergies  Allergen Reactions  . Amiodarone Hives  . Propylthiouracil Rash    Patient Measurements: Height: 5\' 7"  (170.2 cm) Weight: 190 lb 4.1 oz (86.3 kg) IBW/kg (Calculated) : 61.6  Vital Signs: Temp: 97.6 F (36.4 C) (12/14 0549) Temp Source: Oral (12/14 0549) BP: 153/49 mmHg (12/14 0549) Pulse Rate: 62 (12/14 0549)  Labs:  Recent Labs  07/26/14 0302 07/26/14 1548 07/27/14 0500 07/27/14 0640 07/28/14 0444  HGB 9.1* 9.4*  --  9.8* 9.4*  HCT 28.1* 28.5*  --  30.5* 29.6*  PLT 90* 93*  --  104* 116*  LABPROT 21.9*  --   --  20.6* 23.7*  INR 1.89*  --   --  1.75* 2.10*  CREATININE 3.00*  --  2.87*  --  2.82*    Estimated Creatinine Clearance: 18.9 mL/min (by C-G formula based on Cr of 2.82).   Assessment: 67 YOF on Coumadin PTA for Afib. On admission (12/8) the patient was found to have a supra-therapeutic INR of 6.23 and was given vitamin K (10 mg po total). Since admission she has undergone EGD which revealed only an esophageal stricture. The plan now is to watch her HgB and to resume Coumadin  INR today is therapeutic (INR 2.1 << 1.75, goal of 2-3). Hgb/Hct/Plt stable - no overt s/sx of bleeding noted. Will reduce dose today given jump in INR.  Goal of Therapy:  INR 2-3   Plan:  1. Warfarin 1 mg x 1 dose at 1800 today 2. Will continue to monitor for any signs/symptoms of bleeding and will follow up with PT/INR in the a.m.   Alycia Rossetti, PharmD, BCPS Clinical Pharmacist Pager: [phone] 07/28/2014 11:41 AM

## 2014-07-28 NOTE — Evaluation (Signed)
Occupational Therapy Evaluation Patient Details Name: [name] MRN: [id number] DOB: 1935-01-28 Today's Date: 07/28/2014    History of Present Illness 79 year old female patient with atrial fibrillation on Coumadin, known CAD, known multiple GI disorders including prior benign gastric polyp, GI bleed secondary to gastric ulcers after NSAIDs use, as well as esophageal dysmotility, hypertension, and chronic kidney disease who presented to the ER after her family found out she had had 2 bloody bowel movements and an episode of coffee-ground emesis. Patient denied abdominal pain.Marland Kitchen  Pt found to have supra therapeutic INR of over 6   Clinical Impression   Pt was assisted for mobility and ADL prior to admission and fairly sedentary.  Pt presents with generalized weakness and R LE pain.  She was not oriented today.  Pt unable to stand or transfer to [facility] this visit. Recommending SNF for ST rehab.  Pt states she plans to go home today with her son.    Follow Up Recommendations  SNF;Supervision/Assistance - 24 hour    Equipment Recommendations       Recommendations for Other Services       Precautions / Restrictions Precautions Precautions: Fall Restrictions Weight Bearing Restrictions: No      Mobility Bed Mobility   Bed Mobility: Rolling;Supine to Sit;Sit to Supine Rolling: Mod assist   Supine to sit: +2 for physical assistance;Max assist Sit to supine: +2 for physical assistance;Max assist   General bed mobility comments: heavy reliance on rail, step by step verbal cues for sequence  Transfers                 General transfer comment: attempted to stand x 1, unable to clear hips with +2 assist    Balance     Sitting balance-Leahy Scale: Fair                                      ADL Overall ADL's : Needs assistance/impaired Eating/Feeding: Set up;Bed level   Grooming: Wash/dry hands;Wash/dry face;Set up;Bed level   Upper Body Bathing:  Moderate assistance;Sitting   Lower Body Bathing: +2 for physical assistance;Total assistance;Bed level   Upper Body Dressing : Sitting;Moderate assistance   Lower Body Dressing: +2 for physical assistance;Total assistance;Bed level                 General ADL Comments: Pt stating she needed to urinate.  Attempted a +2 stand pivot transfer, but pt unable to assist. Returned to supine and used bed pan.     Vision                     Perception     Praxis      Pertinent Vitals/Pain Pain Assessment: Faces Faces Pain Scale: Hurts worst Pain Location: R LE with movement Pain Intervention(s): Repositioned;RN gave pain meds during session;Monitored during session;Limited activity within patient's tolerance     Hand Dominance Right   Extremity/Trunk Assessment Upper Extremity Assessment Upper Extremity Assessment: Generalized weakness   Lower Extremity Assessment Lower Extremity Assessment: Defer to PT evaluation   Cervical / Trunk Assessment Cervical / Trunk Assessment: Kyphotic   Communication Communication Communication: No difficulties   Cognition Arousal/Alertness: Awake/alert Behavior During Therapy: Anxious Overall Cognitive Status: Impaired/Different from baseline Area of Impairment: Orientation;Memory;Safety/judgement Orientation Level: Disoriented to;Place;Time;Situation (knew she was in the hospital, could not name it)       Safety/Judgement: Decreased awareness of  safety;Decreased awareness of deficits         General Comments       Exercises       Shoulder Instructions      Home Living Family/patient expects to be discharged to:: Private residence Living Arrangements: Children Available Help at Discharge: Family;Available 24 hours/day Type of Home: House Home Access: Stairs to enter [employer] of Steps: 2 Entrance Stairs-Rails: Right;Left Home Layout: One level     Bathroom Shower/Tub: Engineer, site: Standard     Home Equipment: Environmental consultant - 2 wheels;Bedside commode;Electric scooter   Additional Comments: Has slept alot lately; ambulates short distance only      Prior Functioning/Environment Level of Independence: Needs assistance    ADL's / Homemaking Assistance Needed: pt reports someone helps her with bathing and dressing   Comments: pt reports that someone walks with her everywhere    OT Diagnosis: Generalized weakness;Cognitive deficits;Acute pain   OT Problem List: Decreased strength;Decreased activity tolerance;Impaired balance (sitting and/or standing);Decreased cognition;Decreased knowledge of use of DME or AE;Obesity;Pain   OT Treatment/Interventions:      OT Goals(Current goals can be found in the care plan section) Acute Rehab OT Goals Patient Stated Goal: not stated  OT Frequency:     Barriers to D/C:            Co-evaluation              End of Session Nurse Communication: Mobility status  Activity Tolerance: Patient limited by pain Patient left: in bed;with call bell/phone within reach   Time: 1058-1130 OT Time Calculation (min): 32 min Charges:  OT General Charges $OT Visit: 1 Procedure OT Evaluation $Initial OT Evaluation Tier I: 1 Procedure OT Treatments $Self Care/Home Management : 8-22 mins G-Codes:    Malka So 07/28/2014, 11:31 AM [phone]

## 2014-07-28 NOTE — Progress Notes (Signed)
PT Cancellation Note  Patient Details Name: [name] MRN: [id number] DOB: 29-Dec-1934   Cancelled Treatment:    Reason Eval/Treat Not Completed: Patient declined, no reason specified, pt reported that she was not up to moving around today, encouraged pt in value of mobility but pt firm. Pt reported nausea, RN notified of this.   [place], [place] 07/28/2014, 12:49 PM

## 2014-07-29 ENCOUNTER — Inpatient Hospital Stay (HOSPITAL_COMMUNITY): Payer: Medicare Other

## 2014-07-29 DIAGNOSIS — Z7901 Long term (current) use of anticoagulants: Secondary | ICD-10-CM

## 2014-07-29 LAB — HEPATIC FUNCTION PANEL
ALT: 7 U/L (ref 0–35)
AST: 17 U/L (ref 0–37)
Albumin: 2.5 g/dL — ABNORMAL LOW (ref 3.5–5.2)
Alkaline Phosphatase: 66 U/L (ref 39–117)
Bilirubin, Direct: 2.6 mg/dL — ABNORMAL HIGH (ref 0.0–0.3)
Indirect Bilirubin: 1.6 mg/dL — ABNORMAL HIGH (ref 0.3–0.9)
TOTAL PROTEIN: 6.3 g/dL (ref 6.0–8.3)
Total Bilirubin: 4.2 mg/dL — ABNORMAL HIGH (ref 0.3–1.2)

## 2014-07-29 LAB — BASIC METABOLIC PANEL
Anion gap: 20 — ABNORMAL HIGH (ref 5–15)
BUN: 84 mg/dL — ABNORMAL HIGH (ref 6–23)
CALCIUM: 9 mg/dL (ref 8.4–10.5)
CO2: 20 mEq/L (ref 19–32)
CREATININE: 2.8 mg/dL — AB (ref 0.50–1.10)
Chloride: 100 mEq/L (ref 96–112)
GFR calc non Af Amer: 15 mL/min — ABNORMAL LOW (ref >90)
GFR, EST AFRICAN AMERICAN: 18 mL/min — AB (ref >90)
Glucose, Bld: 95 mg/dL (ref 70–99)
Potassium: 4.1 mEq/L (ref 3.7–5.3)
Sodium: 140 mEq/L (ref 137–147)

## 2014-07-29 LAB — CBC
HCT: 31 % — ABNORMAL LOW (ref 36.0–46.0)
Hemoglobin: 10 g/dL — ABNORMAL LOW (ref 12.0–15.0)
MCH: 29.2 pg (ref 26.0–34.0)
MCHC: 32.3 g/dL (ref 30.0–36.0)
MCV: 90.4 fL (ref 78.0–100.0)
PLATELETS: 138 10*3/uL — AB (ref 150–400)
RBC: 3.43 MIL/uL — ABNORMAL LOW (ref 3.87–5.11)
RDW: 17.5 % — AB (ref 11.5–15.5)
WBC: 7.8 10*3/uL (ref 4.0–10.5)

## 2014-07-29 LAB — PROTIME-INR
INR: 2.6 — AB (ref 0.00–1.49)
Prothrombin Time: 28.1 seconds — ABNORMAL HIGH (ref 11.6–15.2)

## 2014-07-29 MED ORDER — WARFARIN SODIUM 1 MG PO TABS
1.0000 mg | ORAL_TABLET | Freq: Once | ORAL | Status: AC
  Administered 2014-07-29: 1 mg via ORAL
  Filled 2014-07-29: qty 1

## 2014-07-29 NOTE — Clinical Social Work Psychosocial (Signed)
Clinical Social Work Department BRIEF PSYCHOSOCIAL ASSESSMENT 07/29/2014  Patient:  [name], [name]     Account Number:  [id number]     Admit date:  07/22/2014  Clinical Social Worker:  Delrae Sawyers  Date/Time:  07/29/2014 01:20 PM  Referred by:  Physician  Date Referred:  07/29/2014 Referred for  SNF Placement   Other Referral:   none.   Interview type:  Patient Other interview type:   none.    PSYCHOSOCIAL DATA Living Status:  ALONE Admitted from facility:   Level of care:   Primary support name:  [name] Primary support relationship to patient:  CHILD, ADULT Degree of support available:   Strong support system.    CURRENT CONCERNS Current Concerns  Post-Acute Placement   Other Concerns:   none.    SOCIAL WORK ASSESSMENT / PLAN CSW received referral for possible SNF placement at time of discharge. CSW met with pt at bedside to discuss discharge disposition. Pt currently refusing SNF placement, and requesting to be discharge home with home health. CSW updated MD and RNCM regarding information above. CSW signing off.   Assessment/plan status:  Psychosocial Support/Ongoing Assessment of Needs Other assessment/ plan:   none.   Information/referral to community resources:   none.    PATIENT'S/FAMILY'S RESPONSE TO PLAN OF CARE: Pt currently refusing SNF placement. Pt requests to be discharged home with home health services, with assistance from pt's son.       Lubertha Sayres, [place] ([phone]) Licensed Clinical Social Worker Orthopedics [phone]) and Surgical [phone])

## 2014-07-29 NOTE — Clinical Social Work Placement (Signed)
Clinical Social Work Department CLINICAL SOCIAL WORK PLACEMENT NOTE 07/29/2014  Patient:  [name], [name]  Account Number:  [id number] Admit date:  07/22/2014  Clinical Social Worker:  Delrae Sawyers  Date/time:  07/29/2014 03:06 PM  Clinical Social Work is seeking post-discharge placement for this patient at the following level of care:   SKILLED NURSING   (*CSW will update this form in Epic as items are completed)   07/29/2014  Patient/family provided with [place] Department of Clinical Social Work's list of facilities offering this level of care within the geographic area requested by the patient (or if unable, by the patient's family).  07/29/2014  Patient/family informed of their freedom to choose among providers that offer the needed level of care, that participate in Medicare, Medicaid or managed care program needed by the patient, have an available bed and are willing to accept the patient.  07/29/2014  Patient/family informed of [facility]' ownership interest in [facility], as well as of the fact that they are under no obligation to receive care at this facility.  PASARR submitted to EDS on 07/29/2014 PASARR number received on 07/29/2014  FL2 transmitted to all facilities in geographic area requested by pt/family on  07/29/2014 FL2 transmitted to all facilities within larger geographic area on   Patient informed that his/her managed care company has contracts with or will negotiate with  certain facilities, including the following:     Patient/family informed of bed offers received:  07/29/2014 Patient chooses bed at [facility] Physician recommends and patient chooses bed at    Patient to be transferred to [facility] on  07/29/2014 Patient to be transferred to facility by [facility] Patient and family notified of transfer on 07/29/2014 Name of family member notified:  Lawson Radar, pt's son.  The following  physician request were entered in Epic:   Additional Comments:  H[place]erson [place] ([phone]) Licensed Clinical Social Worker Orthopedics [phone]) and Surgical [phone])

## 2014-07-29 NOTE — Clinical Social Work Note (Signed)
CSW received notification from [facility] pt and pt's son change in disposition. Per RNCM, pt and pt's son now requesting SNF placement at time of discharge. MD updated regarding change in disposition.  Pt to be discharge to [facility]. Pt's son updated at bedside.  Facility: [facility]. Report number: [id number] Transportation: EMS ([address]  Lubertha Sayres, [place] ([phone]) Licensed Clinical Social Worker Orthopedics [phone]) and Surgical [phone])

## 2014-07-29 NOTE — Progress Notes (Signed)
Physical Therapy Treatment Patient Details Name: [name] MRN: [id number] DOB: 1935/01/16 Today's Date: 07/29/2014    History of Present Illness 79 year old female patient with atrial fibrillation on Coumadin, known CAD, known multiple GI disorders including prior benign gastric polyp, GI bleed secondary to gastric ulcers after NSAIDs use, as well as esophageal dysmotility, hypertension, and chronic kidney disease who presented to the ER after her family found out she had had 2 bloody bowel movements and an episode of coffee-ground emesis. Patient denied abdominal pain.Marland Kitchen  Pt found to have supra therapeutic INR of over 6    PT Comments    Treatment limited due to painful right LE and patient not willing to try to move if at all painful; did engage at least in edge of bed activity for short while.  Agree with SNF level therapy and needs pre-medication to maximize participation.  Follow Up Recommendations  SNF     Equipment Recommendations  None recommended by PT    Recommendations for Other Services       Precautions / Restrictions Precautions Precautions: Fall    Mobility  Bed Mobility Overal bed mobility: Needs Assistance   Rolling: Mod assist   Supine to sit: Max assist Sit to supine: Mod assist   General bed mobility comments: encouraged pt participation with increased time given for each transition, very painful with movement of right LE  Transfers                 General transfer comment: NT due to pain, pt refusing  Ambulation/Gait                 Stairs            Wheelchair Mobility    Modified Rankin (Stroke Patients Only)       Balance Overall balance assessment: Needs assistance Sitting-balance support: Bilateral upper extremity supported;Feet unsupported Sitting balance-Leahy Scale: Poor Sitting balance - Comments: sat with bedrail close for support, pt attempting to lie back down; gave mod support and encouragement to  stay upright; sat maybe 4 minutes with attempts to have pt engage in moving right LE Postural control: Right lateral lean                          Cognition Arousal/Alertness: Awake/alert Behavior During Therapy: Anxious Overall Cognitive Status: Impaired/Different from baseline   Orientation Level: Disoriented to;Place;Time;Situation       Safety/Judgement: Decreased awareness of safety;Decreased awareness of deficits          Exercises Total Joint Exercises Ankle Circles/Pumps: AROM;10 reps;Supine Heel Slides: AROM;AAROM;Right;Left;Both;5 reps;Supine    General Comments        Pertinent Vitals/Pain Faces Pain Scale: Hurts worst Pain Location: right LE with movement Pain Intervention(s): Monitored during session;Repositioned    Home Living                      Prior Function            PT Goals (current goals can now be found in the care plan section) Progress towards PT goals: Not progressing toward goals - comment (due to pain)    Frequency  Min 3X/week    PT Plan Current plan remains appropriate    Co-evaluation             End of Session   Activity Tolerance: Patient limited by pain Patient left: in bed;with call bell/phone within reach  Time: 6773-7366 PT Time Calculation (min) (ACUTE ONLY): 16 min  Charges:  $Therapeutic Activity: 8-22 mins                    G Codes:      WYNN,CYNDI 24-Aug-2014, 5:12 PM Magda Kiel, [place] Aug 24, 2014

## 2014-07-29 NOTE — Progress Notes (Signed)
Report given to [facility]. All questions answered. Pt will be ready for discharge when non-emergent ambulance arrives for transport.

## 2014-07-29 NOTE — Progress Notes (Signed)
ANTICOAGULATION CONSULT NOTE - Follow Up Consult  Pharmacy Consult for Warfarin Indication: atrial fibrillation  Allergies  Allergen Reactions  . Amiodarone Hives  . Propylthiouracil Rash    Patient Measurements: Height: 5\' 7"  (170.2 cm) Weight: 190 lb 4.1 oz (86.3 kg) IBW/kg (Calculated) : 61.6  Vital Signs: Temp: 98.2 F (36.8 C) (12/15 0640) Temp Source: Oral (12/15 0640) BP: 162/45 mmHg (12/15 0640) Pulse Rate: 60 (12/15 0640)  Labs:  Recent Labs  07/27/14 0500 07/27/14 0640 07/28/14 0444 07/29/14 0350  HGB  --  9.8* 9.4* 10.0*  HCT  --  30.5* 29.6* 31.0*  PLT  --  104* 116* 138*  LABPROT  --  20.6* 23.7* 28.1*  INR  --  1.75* 2.10* 2.60*  CREATININE 2.87*  --  2.82* 2.80*    Estimated Creatinine Clearance: 19 mL/min (by C-G formula based on Cr of 2.8).   Assessment: 12 YOF on Coumadin PTA for Afib. On admission (12/8) the patient was found to have a supra-therapeutic INR of 6.23 and was given vitamin K (10 mg po total). Since admission she has undergone EGD which revealed only an esophageal stricture. The plan now is to watch her HgB and to resume Coumadin  INR today remains therapeutic (INR 2.6 << 2.1, goal of 2-3). Hgb/Hct/Plt stable - no overt s/sx of bleeding noted. Will continue with reduced dose today.  The patient and her son were re-educated on warfarin this admission.  Goal of Therapy:  INR 2-3   Plan:  1. Warfarin 1 mg x 1 dose at 1800 today 2. Will continue to monitor for any signs/symptoms of bleeding and will follow up with PT/INR in the a.m.   Alycia Rossetti, PharmD, BCPS Clinical Pharmacist Pager: [phone] 07/29/2014 9:26 AM
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
## 2014-07-30 ENCOUNTER — Other Ambulatory Visit: Payer: Self-pay | Admitting: *Deleted

## 2014-07-30 MED ORDER — OXYCODONE-ACETAMINOPHEN 5-325 MG PO TABS: 1.0000 | ORAL_TABLET | Freq: Four times a day (QID) | ORAL | Status: DC | PRN

## 2014-07-30 MED ORDER — ALPRAZOLAM 0.5 MG PO TABS: 0.5000 mg | ORAL_TABLET | Freq: Every evening | ORAL | Status: DC | PRN

## 2014-07-30 NOTE — Telephone Encounter (Signed)
[facility] 

## 2014-07-30 NOTE — Telephone Encounter (Signed)
[facility] 

## 2014-08-03 ENCOUNTER — Non-Acute Institutional Stay (SKILLED_NURSING_FACILITY): Payer: Medicare Other | Admitting: Internal Medicine

## 2014-08-03 DIAGNOSIS — K257 Chronic gastric ulcer without hemorrhage or perforation: Secondary | ICD-10-CM

## 2014-08-03 DIAGNOSIS — I2510 Atherosclerotic heart disease of native coronary artery without angina pectoris: Secondary | ICD-10-CM

## 2014-08-03 DIAGNOSIS — K224 Dyskinesia of esophagus: Secondary | ICD-10-CM

## 2014-08-03 DIAGNOSIS — Z7901 Long term (current) use of anticoagulants: Secondary | ICD-10-CM

## 2014-08-03 DIAGNOSIS — I10 Essential (primary) hypertension: Secondary | ICD-10-CM

## 2014-08-03 DIAGNOSIS — L89323 Pressure ulcer of left buttock, stage 3: Secondary | ICD-10-CM

## 2014-08-03 DIAGNOSIS — I482 Chronic atrial fibrillation, unspecified: Secondary | ICD-10-CM

## 2014-08-03 DIAGNOSIS — R7989 Other specified abnormal findings of blood chemistry: Secondary | ICD-10-CM

## 2014-08-03 DIAGNOSIS — N184 Chronic kidney disease, stage 4 (severe): Secondary | ICD-10-CM

## 2014-08-03 DIAGNOSIS — D61818 Other pancytopenia: Secondary | ICD-10-CM

## 2014-08-03 DIAGNOSIS — I270 Primary pulmonary hypertension: Secondary | ICD-10-CM

## 2014-08-03 DIAGNOSIS — I272 Pulmonary hypertension, unspecified: Secondary | ICD-10-CM

## 2014-08-03 DIAGNOSIS — E785 Hyperlipidemia, unspecified: Secondary | ICD-10-CM

## 2014-08-03 DIAGNOSIS — K921 Melena: Secondary | ICD-10-CM

## 2014-08-03 DIAGNOSIS — E0590 Thyrotoxicosis, unspecified without thyrotoxic crisis or storm: Secondary | ICD-10-CM

## 2014-08-03 DIAGNOSIS — D62 Acute posthemorrhagic anemia: Secondary | ICD-10-CM

## 2014-08-03 NOTE — Progress Notes (Signed)
MRN: [id number] Name: [name]  Sex: female Age: 79 y.o. DOB: 07-14-1935  [place] #: Helene Kelp Facility/Room:120 Level Of Care: SNF Provider: Inocencio Homes D Emergency Contacts: Extended Emergency Contact Information Primary Emergency Contact: Dara Lords [place] Mobile Phone: [phone] Relation: Son Secondary Emergency Contact: Darral Dash [place] Phone: [phone] Relation: Friend    Allergies: Amiodarone and Propylthiouracil  Chief Complaint  Patient presents with  . New Admit To SNF    HPI: Patient is 79 y.o. female who is admitted for OT/PT after acute GI bleed in setting of multiple morbidities.  Past Medical History  Diagnosis Date  . Cellulitis and abscess of unspecified site     a. h/o MSRA cellulitis of abdominal wall.  Marland Kitchen CAD (coronary artery disease)     a. DES to OM 01/2005. b. Rotational atherotomy/DES to prox LAD 02/2008. c. Cath 07/2013: stable, for med rx.  . Edema   . Warfarin anticoagulation     a. Followed by PCP.  Marland Kitchen GI bleed     a. EGD 01/2007: antral ulcers related to NSAID/aspirin use.  . Peripheral vascular disease   . Hyperthyroidism     a. Hx of intolerance to PTU.  Marland Kitchen Blood transfusion years ago    "heart problems" (09/26/2013)  . H/O hiatal hernia   . Umbilical hernia     unrepaired (11/24/11)  . Valvular heart disease     Echo 11/2011: mild-mod MR.  Marland Kitchen Hypertension     takes Amlodipine,Hydralazine,and Imdur daily  . Hyperlipidemia     takes Lovastatin daily  . SSS (sick sinus syndrome)     a. H/o AV node ablation 2008 secondary to difficult to control afib/flutter, with subsequent pacer.  . Atrial fibrillation with rapid ventricular response     a. H/o amiodarone thyroiditis. b. s/p AV node ablation 2008 with implantation of PPM 01/2007, upgraded to CRT-P 06/2007.;takes Coumadin daily  . GERD (gastroesophageal reflux disease)     takes Protonix daily  . Chronic diastolic CHF  (congestive heart failure)     takes Lasix daily and Spironolactone   . Pacemaker     Medtronic  . History of gout   . Hemorrhoids   . Depression with anxiety     takes Xanax daily  . Insomnia     takes Xanax if needed  . Heart murmur   . CKD (chronic kidney disease)     Cr ~2.3.  . Kidney stones   . Myocardial infarction     "I've had 2-3; last one was in /1996" (09/26/2013)  . Migraines     "last one was a pretty good while ago" (09/26/2013)  . Arthritis     "all over" (09/26/2013)  . Chronic lower back pain     deteriorating disc  . Iron deficiency anemia     takes an Iron Pill daily  . Pneumonia     Past Surgical History  Procedure Laterality Date  . Esophagogastroduodenoscopy    . Tonsillectomy  ~ 1960  . Laminectomy and microdiscectomy lumbar spine  08/2003  . Cardioversion  10/2004; 02/2007    /E-chart  . Cholecystectomy    . Appendectomy  1954  . Vaginal hysterectomy  ?1981  . Av node ablation  02/2007    /E-chart  . Insert / replace / remove pacemaker  01/2007    initial placement  . Insert / replace / remove pacemaker  06/2007    upgraded/E-chart  . Av fistula placement,  brachiocephalic Right 03/1855    "didn't work; my veins were too small"  . Joint replacement    . Total knee arthroplasty Left 01/2009  . Bladder and bowel  1970's    "tacked; damaged my intestines & had to remove some; done @ [facility]  . Cataract extraction w/ intraocular lens  implant, bilateral Bilateral   . Bascilic vein transposition Right 06/21/2013    Procedure: [place];  Surgeon: Serafina Mitchell, MD;  Location: [place] OR;  Service: Vascular;  Laterality: Right;  . Esophagogastroduodenoscopy N/A 07/19/2013    Procedure: ESOPHAGOGASTRODUODENOSCOPY (EGD);  Surgeon: Lafayette Dragon, MD;  Location: [facility] ENDOSCOPY;  Service: Endoscopy;  Laterality: N/A;  . Balloon dilation N/A 07/19/2013    Procedure: BALLOON DILATION;  Surgeon: Lafayette Dragon, MD;  Location: [facility]  ENDOSCOPY;  Service: Endoscopy;  Laterality: N/A;  . Esophagogastroduodenoscopy N/A 07/20/2013    Procedure: ESOPHAGOGASTRODUODENOSCOPY (EGD);  Surgeon: Lafayette Dragon, MD;  Location: [facility] ENDOSCOPY;  Service: Endoscopy;  Laterality: N/A;  with removal of polyp in pylorus  . Bascilic vein transposition Right 09/20/2013    Procedure: 2ND STAGE RIGHT BASCILIC VEIN TRANSPOSITION; ULTRSOUND GUIDED;  Surgeon: Serafina Mitchell, MD;  Location: [facility] OR;  Service: Vascular;  Laterality: Right;  . Cardiac catheterization  2006; 2008; 2009; 01/13/2011  . Coronary angioplasty with stent placement  ~ 2007; ?    "had a total of 2 stents put in" 92/07/2014)  . Back surgery    . Left heart catheterization with coronary angiogram N/A 07/15/2013    Procedure: LEFT HEART CATHETERIZATION WITH CORONARY ANGIOGRAM;  Surgeon: Blane Ohara, MD;  Location: [facility] CATH LAB;  Service: Cardiovascular;  Laterality: N/A;  . Esophagogastroduodenoscopy N/A 07/25/2014    Procedure: ESOPHAGOGASTRODUODENOSCOPY (EGD);  Surgeon: Irene Shipper, MD;  Location: [facility] ENDOSCOPY;  Service: Endoscopy;  Laterality: N/A;      Medication List       This list is accurate as of: 08/03/14 11:59 PM.  Always use your most recent med list.               ALPRAZolam 0.5 MG tablet  Commonly known as:  XANAX  Take 1 tablet (0.5 mg total) by mouth at bedtime as needed for anxiety or sleep.     aspirin 81 MG tablet  Take 81 mg by mouth daily.     camphor-menthol lotion  Commonly known as:  SARNA  Apply topically as needed for itching.     colchicine 0.6 MG tablet  Take 0.6 mg by mouth 2 (two) times daily as needed (Gout).     ergocalciferol 50000 UNITS capsule  Commonly known as:  VITAMIN D2  Take 50,000 Units by mouth every Saturday. Take on saturdays     Fluticasone-Salmeterol 100-50 MCG/DOSE Aepb  Commonly known as:  ADVAIR DISKUS  Inhale 1 puff into the lungs 2 (two) times daily.     furosemide 80 MG tablet  Commonly known as:  LASIX   Take 1 tablet (80 mg total) by mouth 2 (two) times daily.     hydrALAZINE 25 MG tablet  Commonly known as:  APRESOLINE  TAKE 1 TABLET BY MOUTH 3 TIMES A DAY     isosorbide mononitrate 60 MG 24 hr tablet  Commonly known as:  IMDUR  TAKE 2 TABLETS BY MOUTH EVERY DAY     levothyroxine 100 MCG tablet  Commonly known as:  SYNTHROID, LEVOTHROID  Take 100 mcg by mouth daily before breakfast.  loratadine 10 MG tablet  Commonly known as:  CLARITIN  Take 1 tablet (10 mg total) by mouth daily.     lovastatin 10 MG tablet  Commonly known as:  MEVACOR  Take 10 mg by mouth at bedtime.     magnesium oxide 400 MG tablet  Commonly known as:  MAG-OX  Take 400 mg by mouth at bedtime.     metolazone 2.5 MG tablet  Commonly known as:  ZAROXOLYN  Take 1 tablet (2.5 mg total) by mouth 2 (two) times a week. Monday and Friday     nitroGLYCERIN 0.4 MG SL tablet  Commonly known as:  NITROSTAT  Place 0.4 mg under the tongue every 5 (five) minutes as needed for chest pain.     ondansetron 4 MG tablet  Commonly known as:  ZOFRAN  Take 1 tablet (4 mg total) by mouth every 6 (six) hours.     oxyCODONE-acetaminophen 5-325 MG per tablet  Commonly known as:  PERCOCET/ROXICET  Take 1 tablet by mouth every 6 (six) hours as needed for severe pain.     potassium chloride SA 20 MEQ tablet  Commonly known as:  K-DUR,KLOR-CON  Take 40 mEq by mouth 2 (two) times daily.     promethazine 25 MG tablet  Commonly known as:  PHENERGAN  Take 25 mg by mouth every 8 (eight) hours as needed for nausea or vomiting.     PROTONIX 40 MG tablet  Generic drug:  pantoprazole  Take 40 mg by mouth 2 (two) times daily.     silver sulfADIAZINE 1 % cream  Commonly known as:  SILVADENE  Apply 1 application topically daily. For irration on hip per family member     triamcinolone cream 0.1 %  Commonly known as:  KENALOG  Apply 1 application topically 2 (two) times daily as needed (for rash).     warfarin 2 MG tablet   Commonly known as:  COUMADIN  Take 2-3 mg by mouth daily at 6 PM. Takes 1 tablets (3mg ) on Mondays and Fridays.  Takes 1 tablet (2mg ) on all remaining days.        No orders of the defined types were placed in this encounter.    Immunization History  Administered Date(s) Administered  . Influenza Split 03/28/2014  . Influenza-Unspecified 04/15/2013  . Pneumococcal Polysaccharide-23 07/15/2013    History  Substance Use Topics  . Smoking status: Former Smoker -- 1.00 packs/day for 50 years    Types: Cigarettes    Quit date: 07/13/1995  . Smokeless tobacco: Never Used     Comment: quit smoking around 1996  . Alcohol Use: No    Family history is noncontributory    Review of Systems  DATA OBTAINED: from patient GENERAL:  no fevers, fatigue, appetite changes SKIN: No itching,rash EYES: No eye pain, redness, discharge EARS: No earache, tinnitus, change in hearing NOSE: No congestion, drainage or bleeding  MOUTH/THROAT: No mouth or tooth pain, No sore throat RESPIRATORY: No cough, wheezing, SOB CARDIAC: No chest pain, palpitations, lower extremity edema  GI: No abdominal pain, No N/V/D or constipation, No heartburn or reflux  GU: No dysuria, frequency or urgency, or incontinence  MUSCULOSKELETAL: No unrelieved bone/joint pain NEUROLOGIC: No headache, dizziness or focal weakness PSYCHIATRIC: No overt anxiety or sadness, No behavior issue.   Filed Vitals:   08/03/14 1249  BP: 119/70  Pulse: 84  Temp: 96.4 F (35.8 C)  Resp: 20    Physical Exam  GENERAL APPEARANCE: Alert, conversant,  No acute distress.  SKIN: No diaphoresis rash HEAD: Normocephalic, atraumatic  EYES: Conjunctiva/lids clear. Pupils round, reactive. EOMs intact.  EARS: External exam WNL, canals clear. Hearing grossly normal.  NOSE: No deformity or discharge.  MOUTH/THROAT: Lips w/o lesions  RESPIRATORY: Breathing is even, unlabored. Lung sounds are clear   CARDIOVASCULAR: Heart RRR no murmurs,  rubs or gallops.trace peripheral edema.   GASTROINTESTINAL: Abdomen is soft, non-tender, not distended w/ normal bowel sounds. GENITOURINARY: Bladder non tender, not distended  MUSCULOSKELETAL: No abnormal joints or musculature NEUROLOGIC:  Cranial nerves 2-12 grossly intact. Moves all extremities  PSYCHIATRIC: Mood and affect appropriate to situation, no behavioral issues  Patient Active Problem List   Diagnosis Date Noted  . Decubitus ulcer of left ischium, stage 3 08/04/2014  . Chronic kidney disease (CKD), stage IV (severe)   . Other specified hypothyroidism   . Pulmonary hypertension 07/23/2014  . Chronic right-sided heart failure 07/23/2014  . Severe tricuspid regurgitation 07/23/2014  . Esophageal dysmotility 07/23/2014  . Acute blood loss anemia 07/23/2014  . Increased ammonia level 07/23/2014  . Pancytopenia 07/23/2014  . GI bleed 07/22/2014  . Supratherapeutic INR 07/22/2014  . Long term current use of anticoagulant therapy 05/07/2014  . Lung nodule 11/12/2013  . Gastric polyp 07/21/2013  . Benign neoplasm of stomach 07/20/2013  . Dysphagia 07/18/2013  . CKD (chronic kidney disease) stage 4, GFR 15-29 ml/min 07/09/2013  . HLD (hyperlipidemia) 07/09/2013  . CAD, NATIVE VESSEL 05/05/2010  . ANEMIA 12/25/2008  . BRADYCARDIA 12/25/2008  . PACEMAKER-[employer]tr[employer] 12/25/2008  . Hyperthyroidism 08/07/2007  . DEPRESSION 05/14/2007  . Essential hypertension 05/14/2007  . Campath-induced atrial fibrillation 05/14/2007  . Gastric ulcer (2008) 05/14/2007    CBC    Component Value Date/Time   WBC 7.8 07/29/2014 0350   RBC 3.43* 07/29/2014 0350   HGB 10.0* 07/29/2014 0350   HCT 31.0* 07/29/2014 0350   PLT 138* 07/29/2014 0350   MCV 90.4 07/29/2014 0350   LYMPHSABS 0.6* 07/22/2014 1247   MONOABS 0.4 07/22/2014 1247   EOSABS 0.1 07/22/2014 1247   BASOSABS 0.0 07/22/2014 1247    CMP     Component Value Date/Time   NA 140 07/29/2014 0350   K 4.1 07/29/2014 0350   CL 100  07/29/2014 0350   CO2 20 07/29/2014 0350   GLUCOSE 95 07/29/2014 0350   BUN 84* 07/29/2014 0350   CREATININE 2.80* 07/29/2014 0350   CALCIUM 9.0 07/29/2014 0350   CALCIUM 8.7 03/02/2007 0505   PROT 6.3 07/29/2014 0350   ALBUMIN 2.5* 07/29/2014 0350   AST 17 07/29/2014 0350   ALT 7 07/29/2014 0350   ALKPHOS 66 07/29/2014 0350   BILITOT 4.2* 07/29/2014 0350   GFRNONAA 15* 07/29/2014 0350   GFRAA 18* 07/29/2014 0350    Assessment and Plan  GI bleed / history of Gastric ulcer (2008) Likely influenced by supratherapeutic INR - continue Protonix. GI consulted - EGD 12/11 was unremarkable except for esophageal ring, no bleeding seen Hemoglobin is stable and overall trending up, on discharge hemoglobin is 10.0. Had bowel movement prior to discharge, clear  Gastric ulcer (2008)  Likely influenced by supratherapeutic INR - continue Protonix. GI consulted - EGD 12/11 was unremarkable except for esophageal ring, no bleeding seen Hemoglobin is stable and overall trending up, on discharge hemoglobin is 10.0. Had bowel movement prior to discharge, clear  Long term current use of anticoagulant therapy Given vitamin K and 2 units of FFP at time of admission - additional 2 units FFP on  12/09 and post transfusion INR was 2.35 12/10 was given an additional 2 units FFP and another dose of Vitamin K PO 5 mg x 1. INR is 2.6 on discharge  Acute blood loss anemia Baseline hemoglobin around 13 - Hemoglobin at presentation 9.2 - nadir 7.3. Transfused 2 units packed red blood cells on 12/9. Hemoglobin is stable after anticoagulation resumed  Pancytopenia Chronic thrombocytopenia with platelets now lower than typical range - suspect acute drop due to consumption - white count is also variable and low at times - haptoglobin not c/w hemolysis Patient also has hyperbilirubinemia, hypoalbuminemia, hyperammonemia, cannot rule out cirrhosis secondary to congestive hepatopathy secondary to right-sided heart  failure  Increased ammonia level ? Due to GI bleed and breaking down of the blood. As mentioned above patient has hyperbilirubinemia and hyperammonemia as well as thrombocytopenia. Cannot rule out liver disease secondary to congestive hepatopathy, patient needs imaging as outpatient  Decubitus ulcer of left ischium, stage 3 cont preadmit wound care, PT recommended SNF  CAD, NATIVE VESSEL cont preadmit wound care, PT recommended SNF  Essential hypertension Stable on current meds  Campath-induced atrial fibrillation AV sequentially paced - was on low-dose aspirin and warfarin prior to admission. Warfarin restarted at time of discharge.   CKD (chronic kidney disease) stage 4, GFR 15-29 ml/min Baseline renal function BUN 59 and creatinine 3.47 - BUN continues to improve, and crt is better than her baseline. Back to baseline, creatinine at 2.8, restarted Lasix and Aldactone at the time of discharge  Esophageal dysmotility Has required multiple dilatations in the past - no evidence of complications at present    Hyperthyroidism Previously intolerant to PTU and apparently post-ablation since preadmission meds included Synthroid -TSH 0.365  Pulmonary hypertension Chronic right-sided heart failure and Severe tricuspid regurgitation Currently stable on room air - because of GI bleeding and suboptimal blood pressures usual Lasix and Zaroxolyn were initially held as well as Apresoline and Imdur.  Home medications restarted at the time of discharge.   HLD (hyperlipidemia) Continue mevacor- LFT's nl   Pt seen 08/01/2014 Hennie Duos, MD

## 2014-08-04 ENCOUNTER — Encounter: Payer: Self-pay | Admitting: Internal Medicine

## 2014-08-04 DIAGNOSIS — L89323 Pressure ulcer of left buttock, stage 3: Secondary | ICD-10-CM | POA: Insufficient documentation

## 2014-08-04 NOTE — Assessment & Plan Note (Signed)
Given vitamin K and 2 units of FFP at time of admission - additional 2 units FFP on 12/09 and post transfusion INR was 2.35 12/10 was given an additional 2 units FFP and another dose of Vitamin K PO 5 mg x 1. INR is 2.6 on discharge

## 2014-08-04 NOTE — Assessment & Plan Note (Signed)
  Likely influenced by supratherapeutic INR - continue Protonix. GI consulted - EGD 12/11 was unremarkable except for esophageal ring, no bleeding seen Hemoglobin is stable and overall trending up, on discharge hemoglobin is 10.0. Had bowel movement prior to discharge, clear

## 2014-08-04 NOTE — Assessment & Plan Note (Signed)
/   history of Gastric ulcer (2008) Likely influenced by supratherapeutic INR - continue Protonix. GI consulted - EGD 12/11 was unremarkable except for esophageal ring, no bleeding seen Hemoglobin is stable and overall trending up, on discharge hemoglobin is 10.0. Had bowel movement prior to discharge, clear

## 2014-08-04 NOTE — Assessment & Plan Note (Signed)
Baseline hemoglobin around 13 - Hemoglobin at presentation 9.2 - nadir 7.3. Transfused 2 units packed red blood cells on 12/9. Hemoglobin is stable after anticoagulation resumed

## 2014-08-04 NOTE — Assessment & Plan Note (Signed)
cont preadmit wound care, PT recommended SNF

## 2014-08-04 NOTE — Assessment & Plan Note (Signed)
AV sequentially paced - was on low-dose aspirin and warfarin prior to admission. Warfarin restarted at time of discharge.

## 2014-08-04 NOTE — Assessment & Plan Note (Signed)
Chronic right-sided heart failure and Severe tricuspid regurgitation Currently stable on room air - because of GI bleeding and suboptimal blood pressures usual Lasix and Zaroxolyn were initially held as well as Apresoline and Imdur.  Home medications restarted at the time of discharge.

## 2014-08-04 NOTE — Assessment & Plan Note (Signed)
?   Due to GI bleed and breaking down of the blood. As mentioned above patient has hyperbilirubinemia and hyperammonemia as well as thrombocytopenia. Cannot rule out liver disease secondary to congestive hepatopathy, patient needs imaging as outpatient

## 2014-08-04 NOTE — Assessment & Plan Note (Signed)
Has required multiple dilatations in the past - no evidence of complications at present

## 2014-08-04 NOTE — Assessment & Plan Note (Signed)
Stable on current meds 

## 2014-08-04 NOTE — Assessment & Plan Note (Signed)
Continue mevacor- LFT's nl

## 2014-08-04 NOTE — Assessment & Plan Note (Signed)
Chronic thrombocytopenia with platelets now lower than typical range - suspect acute drop due to consumption - white count is also variable and low at times - haptoglobin not c/w hemolysis Patient also has hyperbilirubinemia, hypoalbuminemia, hyperammonemia, cannot rule out cirrhosis secondary to congestive hepatopathy secondary to right-sided heart failure

## 2014-08-04 NOTE — Assessment & Plan Note (Signed)
Baseline renal function BUN 59 and creatinine 3.47 - BUN continues to improve, and crt is better than her baseline. Back to baseline, creatinine at 2.8, restarted Lasix and Aldactone at the time of discharge

## 2014-08-04 NOTE — Assessment & Plan Note (Signed)
Previously intolerant to PTU and apparently post-ablation since preadmission meds included Synthroid -TSH 0.365

## 2014-08-07 ENCOUNTER — Encounter (HOSPITAL_COMMUNITY): Payer: Self-pay | Admitting: Emergency Medicine

## 2014-08-07 ENCOUNTER — Inpatient Hospital Stay (HOSPITAL_COMMUNITY)
Admission: EM | Admit: 2014-08-07 | Discharge: 2014-08-13 | DRG: 378 | Disposition: A | Discharge status: Home Health | Payer: Medicare Other | Attending: Internal Medicine | Admitting: Internal Medicine

## 2014-08-07 DIAGNOSIS — I5032 Chronic diastolic (congestive) heart failure: Secondary | ICD-10-CM | POA: Diagnosis present

## 2014-08-07 DIAGNOSIS — Z87891 Personal history of nicotine dependence: Secondary | ICD-10-CM | POA: Diagnosis not present

## 2014-08-07 DIAGNOSIS — I2510 Atherosclerotic heart disease of native coronary artery without angina pectoris: Secondary | ICD-10-CM | POA: Diagnosis present

## 2014-08-07 DIAGNOSIS — E876 Hypokalemia: Secondary | ICD-10-CM

## 2014-08-07 DIAGNOSIS — Z79899 Other long term (current) drug therapy: Secondary | ICD-10-CM

## 2014-08-07 DIAGNOSIS — Z7982 Long term (current) use of aspirin: Secondary | ICD-10-CM | POA: Diagnosis not present

## 2014-08-07 DIAGNOSIS — E785 Hyperlipidemia, unspecified: Secondary | ICD-10-CM | POA: Diagnosis present

## 2014-08-07 DIAGNOSIS — D62 Acute posthemorrhagic anemia: Secondary | ICD-10-CM | POA: Diagnosis present

## 2014-08-07 DIAGNOSIS — E039 Hypothyroidism, unspecified: Secondary | ICD-10-CM | POA: Diagnosis present

## 2014-08-07 DIAGNOSIS — Z7901 Long term (current) use of anticoagulants: Secondary | ICD-10-CM

## 2014-08-07 DIAGNOSIS — I252 Old myocardial infarction: Secondary | ICD-10-CM | POA: Diagnosis not present

## 2014-08-07 DIAGNOSIS — T45515A Adverse effect of anticoagulants, initial encounter: Secondary | ICD-10-CM | POA: Diagnosis present

## 2014-08-07 DIAGNOSIS — K922 Gastrointestinal hemorrhage, unspecified: Secondary | ICD-10-CM | POA: Diagnosis present

## 2014-08-07 DIAGNOSIS — K920 Hematemesis: Secondary | ICD-10-CM | POA: Diagnosis present

## 2014-08-07 DIAGNOSIS — K219 Gastro-esophageal reflux disease without esophagitis: Secondary | ICD-10-CM | POA: Diagnosis present

## 2014-08-07 DIAGNOSIS — Z96652 Presence of left artificial knee joint: Secondary | ICD-10-CM | POA: Diagnosis present

## 2014-08-07 DIAGNOSIS — I4891 Unspecified atrial fibrillation: Secondary | ICD-10-CM | POA: Diagnosis present

## 2014-08-07 DIAGNOSIS — Z950 Presence of cardiac pacemaker: Secondary | ICD-10-CM

## 2014-08-07 DIAGNOSIS — M25461 Effusion, right knee: Secondary | ICD-10-CM | POA: Diagnosis present

## 2014-08-07 DIAGNOSIS — K625 Hemorrhage of anus and rectum: Secondary | ICD-10-CM | POA: Diagnosis present

## 2014-08-07 DIAGNOSIS — I129 Hypertensive chronic kidney disease with stage 1 through stage 4 chronic kidney disease, or unspecified chronic kidney disease: Secondary | ICD-10-CM | POA: Diagnosis present

## 2014-08-07 DIAGNOSIS — R52 Pain, unspecified: Secondary | ICD-10-CM | POA: Insufficient documentation

## 2014-08-07 DIAGNOSIS — M179 Osteoarthritis of knee, unspecified: Secondary | ICD-10-CM | POA: Diagnosis present

## 2014-08-07 DIAGNOSIS — Z66 Do not resuscitate: Secondary | ICD-10-CM | POA: Diagnosis present

## 2014-08-07 DIAGNOSIS — R110 Nausea: Secondary | ICD-10-CM | POA: Diagnosis not present

## 2014-08-07 DIAGNOSIS — R791 Abnormal coagulation profile: Secondary | ICD-10-CM | POA: Diagnosis present

## 2014-08-07 DIAGNOSIS — Z90710 Acquired absence of both cervix and uterus: Secondary | ICD-10-CM

## 2014-08-07 DIAGNOSIS — I5033 Acute on chronic diastolic (congestive) heart failure: Secondary | ICD-10-CM

## 2014-08-07 DIAGNOSIS — N184 Chronic kidney disease, stage 4 (severe): Secondary | ICD-10-CM | POA: Diagnosis present

## 2014-08-07 DIAGNOSIS — K921 Melena: Principal | ICD-10-CM | POA: Insufficient documentation

## 2014-08-07 DIAGNOSIS — Z87442 Personal history of urinary calculi: Secondary | ICD-10-CM

## 2014-08-07 LAB — PROTIME-INR
INR: 3.8 — AB (ref 0.00–1.49)
PROTHROMBIN TIME: 37.7 s — AB (ref 11.6–15.2)

## 2014-08-07 LAB — COMPREHENSIVE METABOLIC PANEL
ALT: 24 U/L (ref 0–35)
AST: 53 U/L — AB (ref 0–37)
Albumin: 2.4 g/dL — ABNORMAL LOW (ref 3.5–5.2)
Alkaline Phosphatase: 82 U/L (ref 39–117)
Anion gap: 8 (ref 5–15)
BUN: 99 mg/dL — ABNORMAL HIGH (ref 6–23)
CALCIUM: 8.6 mg/dL (ref 8.4–10.5)
CO2: 24 mmol/L (ref 19–32)
CREATININE: 2.86 mg/dL — AB (ref 0.50–1.10)
Chloride: 108 mEq/L (ref 96–112)
GFR calc Af Amer: 17 mL/min — ABNORMAL LOW (ref >90)
GFR calc non Af Amer: 15 mL/min — ABNORMAL LOW (ref >90)
Glucose, Bld: 92 mg/dL (ref 70–99)
Potassium: 3.8 mmol/L (ref 3.5–5.1)
SODIUM: 140 mmol/L (ref 135–145)
TOTAL PROTEIN: 6.2 g/dL (ref 6.0–8.3)
Total Bilirubin: 2 mg/dL — ABNORMAL HIGH (ref 0.3–1.2)

## 2014-08-07 LAB — CBC WITH DIFFERENTIAL/PLATELET
BASOS ABS: 0.1 10*3/uL (ref 0.0–0.1)
Basophils Relative: 1 % (ref 0–1)
EOS ABS: 0.3 10*3/uL (ref 0.0–0.7)
EOS PCT: 4 % (ref 0–5)
HCT: 25.4 % — ABNORMAL LOW (ref 36.0–46.0)
Hemoglobin: 8.3 g/dL — ABNORMAL LOW (ref 12.0–15.0)
Lymphocytes Relative: 9 % — ABNORMAL LOW (ref 12–46)
Lymphs Abs: 0.6 10*3/uL — ABNORMAL LOW (ref 0.7–4.0)
MCH: 29.7 pg (ref 26.0–34.0)
MCHC: 32.7 g/dL (ref 30.0–36.0)
MCV: 91 fL (ref 78.0–100.0)
Monocytes Absolute: 0.4 10*3/uL (ref 0.1–1.0)
Monocytes Relative: 6 % (ref 3–12)
Neutro Abs: 5.1 10*3/uL (ref 1.7–7.7)
Neutrophils Relative %: 80 % — ABNORMAL HIGH (ref 43–77)
Platelets: 202 10*3/uL (ref 150–400)
RBC: 2.79 MIL/uL — ABNORMAL LOW (ref 3.87–5.11)
RDW: 17 % — AB (ref 11.5–15.5)
WBC: 6.4 10*3/uL (ref 4.0–10.5)

## 2014-08-07 LAB — MRSA PCR SCREENING: MRSA BY PCR: NEGATIVE

## 2014-08-07 LAB — PREPARE RBC (CROSSMATCH)

## 2014-08-07 LAB — LIPASE, BLOOD: Lipase: 40 U/L (ref 11–59)

## 2014-08-07 LAB — APTT: APTT: 50 s — AB (ref 24–37)

## 2014-08-07 LAB — ABO/RH: ABO/RH(D): O POS

## 2014-08-07 MED ORDER — ISOSORBIDE MONONITRATE ER 60 MG PO TB24
120.0000 mg | ORAL_TABLET | Freq: Every day | ORAL | Status: DC
  Administered 2014-08-08 – 2014-08-13 (×6): 120 mg via ORAL
  Filled 2014-08-07 (×6): qty 2

## 2014-08-07 MED ORDER — PANTOPRAZOLE SODIUM 40 MG IV SOLR
40.0000 mg | Freq: Two times a day (BID) | INTRAVENOUS | Status: DC
  Administered 2014-08-07 – 2014-08-12 (×11): 40 mg via INTRAVENOUS
  Filled 2014-08-07 (×11): qty 40

## 2014-08-07 MED ORDER — PHYTONADIONE 5 MG PO TABS
5.0000 mg | ORAL_TABLET | ORAL | Status: AC
  Administered 2014-08-07: 5 mg via ORAL
  Filled 2014-08-07: qty 1

## 2014-08-07 MED ORDER — BLISTEX MEDICATED EX OINT: TOPICAL_OINTMENT | CUTANEOUS | Status: DC | PRN

## 2014-08-07 MED ORDER — MOMETASONE FURO-FORMOTEROL FUM 100-5 MCG/ACT IN AERO
2.0000 | INHALATION_SPRAY | Freq: Two times a day (BID) | RESPIRATORY_TRACT | Status: DC
  Administered 2014-08-07 – 2014-08-13 (×13): 2 via RESPIRATORY_TRACT
  Filled 2014-08-07: qty 8.8

## 2014-08-07 MED ORDER — LIP MEDEX EX OINT
TOPICAL_OINTMENT | CUTANEOUS | Status: DC | PRN
  Filled 2014-08-07: qty 7

## 2014-08-07 MED ORDER — SODIUM CHLORIDE 0.9 % IV SOLN
INTRAVENOUS | Status: DC
  Administered 2014-08-07: 10:00:00 via INTRAVENOUS
  Administered 2014-08-08: 1000 mL via INTRAVENOUS
  Administered 2014-08-08: 06:00:00 via INTRAVENOUS

## 2014-08-07 MED ORDER — ONDANSETRON HCL 4 MG/2ML IJ SOLN: 4.0000 mg | Freq: Four times a day (QID) | INTRAMUSCULAR | Status: DC | PRN

## 2014-08-07 MED ORDER — FUROSEMIDE 40 MG PO TABS
80.0000 mg | ORAL_TABLET | Freq: Two times a day (BID) | ORAL | Status: DC
  Administered 2014-08-08 – 2014-08-13 (×10): 80 mg via ORAL
  Filled 2014-08-07 (×11): qty 2

## 2014-08-07 MED ORDER — SODIUM CHLORIDE 0.9 % IV SOLN: Freq: Once | INTRAVENOUS | Status: DC

## 2014-08-07 MED ORDER — MORPHINE SULFATE 2 MG/ML IJ SOLN
1.0000 mg | INTRAMUSCULAR | Status: DC | PRN
  Administered 2014-08-07 – 2014-08-11 (×5): 1 mg via INTRAVENOUS
  Filled 2014-08-07 (×5): qty 1

## 2014-08-07 MED ORDER — ONDANSETRON HCL 4 MG PO TABS: 4.0000 mg | ORAL_TABLET | Freq: Four times a day (QID) | ORAL | Status: DC | PRN

## 2014-08-07 MED ORDER — SODIUM CHLORIDE 0.9 % IV SOLN
Freq: Once | INTRAVENOUS | Status: AC
  Administered 2014-08-07: 15:00:00 via INTRAVENOUS

## 2014-08-07 MED ORDER — ALPRAZOLAM 0.5 MG PO TABS
0.5000 mg | ORAL_TABLET | Freq: Every evening | ORAL | Status: DC | PRN
  Administered 2014-08-12 (×2): 0.5 mg via ORAL
  Filled 2014-08-07 (×2): qty 1

## 2014-08-07 MED ORDER — LEVOTHYROXINE SODIUM 100 MCG PO TABS
100.0000 ug | ORAL_TABLET | Freq: Every day | ORAL | Status: DC
  Administered 2014-08-08 – 2014-08-13 (×6): 100 ug via ORAL
  Filled 2014-08-07 (×2): qty 1
  Filled 2014-08-07: qty 2
  Filled 2014-08-07: qty 1
  Filled 2014-08-07: qty 2
  Filled 2014-08-07: qty 1

## 2014-08-07 MED ORDER — SODIUM CHLORIDE 0.9 % IJ SOLN
3.0000 mL | Freq: Two times a day (BID) | INTRAMUSCULAR | Status: DC
  Administered 2014-08-07 – 2014-08-12 (×9): 3 mL via INTRAVENOUS

## 2014-08-07 MED ORDER — METOLAZONE 2.5 MG PO TABS
2.5000 mg | ORAL_TABLET | ORAL | Status: DC
  Administered 2014-08-07 – 2014-08-11 (×2): 2.5 mg via ORAL
  Filled 2014-08-07 (×2): qty 1

## 2014-08-07 NOTE — Progress Notes (Signed)
Patient ID: [name], female   DOB: June 11, 1935, 79 y.o.   MRN: [id number] KEITH CANCIO is a 79 y.o. female with a hx of CAD, s/p DES to the OM in 6/06, rotational atherectomy + DES to pLAD in 7/09, symptomatic bradycardia s/p pacemaker with upgrade to CRT-P 11/08, AFib s/p AVN ablation (prior amiodarone thyroiditis), chronic diastolic CHF, mitral regurgitation, CKD stage IV, HTN, HL, GERD, prior UGI bleed in 2008 related to NSAID use, PVD. She has had a difficult time with access for hemodialysis. She had a brachial vein transposition in 09/2013.  She was seen in 06/2013 with worsening chest pain consistent with CCS class III angina. She was ultimately set up for cardiac catheterization that demonstrated patent stent in the LAD and obtuse marginal 1 with nonobstructive disease elsewhere. Medical therapy was continued.  She was admitted in 07/2013 with progressive dysphagia. EGD demonstrated large prepyloric ulcer and polyp which was removed. She had esophageal dilatation.  Admitted in 09/2013 with a/c renal failure in the setting of nausea, vomiting and anorexia with poor by mouth intake.   - Myoview (01/2009): EF 79%, no scar or ischemia.  - Echo (03/2013): Mild focal basal septal hypertrophy, EF 55-60%, mild MR, moderate to severe LAE, mild to moderate RVE, reduced RVSF, moderate RAE.  - LHC (07/14/13): pLAD stent ok, LAD 30-40, ostial first septal perf 80, OM1 stent ok with 20 ISR, AVCFX 20-30, prox to mid RCA 30-40. Med Rx.  Recent Labs:  12/24  CR 2.86   Hct 25.6   Abdominal CT (10/2013):  IMPRESSION: Scattered hepatic cysts measuring up to 17 mm. Liver is otherwise unremarkable on unenhanced CT. 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 Mild patchy/ground-glass opacity in the right middle lobe, suspicious for pneumonia. Trace right pleural effusion. 11 x 7 mm patchy/nodular opacity at the left lung base,  possibly infectious/inflammatory, although indeterminate. Consider follow-up CT chest in 3 months to assess for persistence. Wt Readings from Last 3 Encounters:   11/07/13  208 lb (94.348 kg)   11/04/13  206 lb 1.6 oz (93.486 kg)   11/01/13  208 lb (94.348 kg)    Some nausea and uremic symptoms RUE fistula slowly healing with wet to dries On antibiotics for arm as well

## 2014-08-07 NOTE — ED Notes (Signed)
Consent signed.

## 2014-08-07 NOTE — ED Provider Notes (Signed)
CSN: [id number]     Arrival date & time 08/07/14  0913 History   First MD Initiated Contact with Patient 08/07/14 0913     Chief Complaint  Patient presents with  . Rectal Bleeding  . Hematemesis     (Consider location/radiation/quality/duration/timing/severity/associated sxs/prior Treatment) HPI Comments: Patient here complaining of hematemesis 2 along with bloody stools. Patient symptoms began today and she was recently evaluated for similar symptoms 2 weeks ago and found to have a GI bleed at that time likely from a super therapeutic Coumadin level which was 6. She denies any abdominal pain. No fever or chills. Notes diffuse weakness without syncope. Symptoms persistent and nothing makes them better or worse. No treatment use prior to arrival. Patient was transported by EMS  Patient is a 79 y.o. female presenting with hematochezia. The history is provided by the patient and medical records.  Rectal Bleeding   Past Medical History  Diagnosis Date  . Cellulitis and abscess of unspecified site     a. h/o MSRA cellulitis of abdominal wall.  Marland Kitchen CAD (coronary artery disease)     a. DES to OM 01/2005. b. Rotational atherotomy/DES to prox LAD 02/2008. c. Cath 07/2013: stable, for med rx.  . Edema   . Warfarin anticoagulation     a. Followed by PCP.  Marland Kitchen GI bleed     a. EGD 01/2007: antral ulcers related to NSAID/aspirin use.  . Peripheral vascular disease   . Hyperthyroidism     a. Hx of intolerance to PTU.  Marland Kitchen Blood transfusion years ago    "heart problems" (09/26/2013)  . H/O hiatal hernia   . Umbilical hernia     unrepaired (11/24/11)  . Valvular heart disease     Echo 11/2011: mild-mod MR.  Marland Kitchen Hypertension     takes Amlodipine,Hydralazine,and Imdur daily  . Hyperlipidemia     takes Lovastatin daily  . SSS (sick sinus syndrome)     a. H/o AV node ablation 2008 secondary to difficult to control afib/flutter, with subsequent pacer.  . Atrial fibrillation with rapid ventricular response      a. H/o amiodarone thyroiditis. b. s/p AV node ablation 2008 with implantation of PPM 01/2007, upgraded to CRT-P 06/2007.;takes Coumadin daily  . GERD (gastroesophageal reflux disease)     takes Protonix daily  . Chronic diastolic CHF (congestive heart failure)     takes Lasix daily and Spironolactone   . Pacemaker     [employer]tr[employer]  . History of gout   . Hemorrhoids   . Depression with anxiety     takes Xanax daily  . Insomnia     takes Xanax if needed  . Heart murmur   . CKD (chronic kidney disease)     Cr ~2.3.  . Kidney stones   . Myocardial infarction     "I've had 2-3; last one was in /1996" (09/26/2013)  . Migraines     "last one was a pretty good while ago" (09/26/2013)  . Arthritis     "all over" (09/26/2013)  . Chronic lower back pain     deteriorating disc  . Iron deficiency anemia     takes an Iron Pill daily  . Pneumonia    Past Surgical History  Procedure Laterality Date  . Esophagogastroduodenoscopy    . Tonsillectomy  ~ 1960  . Laminectomy and microdiscectomy lumbar spine  08/2003  . Cardioversion  10/2004; 02/2007    /E-chart  . Cholecystectomy    . Appendectomy  1954  .  Vaginal hysterectomy  ?1981  . Av node ablation  02/2007    /E-chart  . Insert / replace / remove pacemaker  01/2007    initial placement  . Insert / replace / remove pacemaker  06/2007    upgraded/E-chart  . Av fistula placement, brachiocephalic Right 08/9145    "didn't work; my veins were too small"  . Joint replacement    . Total knee arthroplasty Left 01/2009  . Bladder and bowel  1970's    "tacked; damaged my intestines & had to remove some; done @ [facility]  . Cataract extraction w/ intraocular lens  implant, bilateral Bilateral   . Bascilic vein transposition Right 06/21/2013    Procedure: Candor;  Surgeon: Serafina Mitchell, MD;  Location: [facility] OR;  Service: Vascular;  Laterality: Right;  . Esophagogastroduodenoscopy N/A 07/19/2013    Procedure:  ESOPHAGOGASTRODUODENOSCOPY (EGD);  Surgeon: Lafayette Dragon, MD;  Location: [facility] ENDOSCOPY;  Service: Endoscopy;  Laterality: N/A;  . Balloon dilation N/A 07/19/2013    Procedure: BALLOON DILATION;  Surgeon: Lafayette Dragon, MD;  Location: [facility] ENDOSCOPY;  Service: Endoscopy;  Laterality: N/A;  . Esophagogastroduodenoscopy N/A 07/20/2013    Procedure: ESOPHAGOGASTRODUODENOSCOPY (EGD);  Surgeon: Lafayette Dragon, MD;  Location: [facility] ENDOSCOPY;  Service: Endoscopy;  Laterality: N/A;  with removal of polyp in pylorus  . Bascilic vein transposition Right 09/20/2013    Procedure: 2ND STAGE RIGHT BASCILIC VEIN TRANSPOSITION; ULTRSOUND GUIDED;  Surgeon: Serafina Mitchell, MD;  Location: [facility] OR;  Service: Vascular;  Laterality: Right;  . Cardiac catheterization  2006; 2008; 2009; 01/13/2011  . Coronary angioplasty with stent placement  ~ 2007; ?    "had a total of 2 stents put in" 92/07/2014)  . Back surgery    . Left heart catheterization with coronary angiogram N/A 07/15/2013    Procedure: LEFT HEART CATHETERIZATION WITH CORONARY ANGIOGRAM;  Surgeon: Blane Ohara, MD;  Location: [facility] CATH LAB;  Service: Cardiovascular;  Laterality: N/A;  . Esophagogastroduodenoscopy N/A 07/25/2014    Procedure: ESOPHAGOGASTRODUODENOSCOPY (EGD);  Surgeon: Irene Shipper, MD;  Location: [facility] ENDOSCOPY;  Service: Endoscopy;  Laterality: N/A;   Family History  Problem Relation Age of Onset  . Colon cancer Neg Hx   . Thyroid disease Neg Hx   . Stroke Brother   . Hypertension Brother   . Kidney disease Brother   . Kidney disease Brother   . Other Mother     Sepsis and perforated colon  . Kidney disease Mother   . Other Father     Motor vehicle accident  . Learning disabilities Son   . Kidney disease Maternal Aunt   . Kidney disease Maternal Grandmother    History  Substance Use Topics  . Smoking status: Former Smoker -- 1.00 packs/day for 50 years    Types: Cigarettes    Quit date: 07/13/1995  . Smokeless tobacco: Never Used      Comment: quit smoking around 1996  . Alcohol Use: No   OB History    No data available     Review of Systems  Gastrointestinal: Positive for hematochezia.  All other systems reviewed and are negative.     Allergies  Amiodarone and Propylthiouracil  Home Medications   Prior to Admission medications   Medication Sig Start Date End Date Taking? Authorizing Provider  ALPRAZolam Duanne Moron) 0.5 MG tablet Take 1 tablet (0.5 mg total) by mouth at bedtime as needed for anxiety or sleep. 07/30/14   [facility]  Viviano Simas, DO  aspirin 81 MG tablet Take 81 mg by mouth daily.    Historical Provider, MD  camphor-menthol Timoteo Ace) lotion Apply topically as needed for itching. 05/12/14   Barton Dubois, MD  colchicine 0.6 MG tablet Take 0.6 mg by mouth 2 (two) times daily as needed (Gout).  05/20/14   Historical Provider, MD  ergocalciferol (VITAMIN D2) 50000 UNITS capsule Take 50,000 Units by mouth every Saturday. Take on saturdays    Historical Provider, MD  Fluticasone-Salmeterol (ADVAIR DISKUS) 100-50 MCG/DOSE AEPB Inhale 1 puff into the lungs 2 (two) times daily. 01/27/14   Brand Males, MD  furosemide (LASIX) 80 MG tablet Take 1 tablet (80 mg total) by mouth 2 (two) times daily. 05/12/14   Barton Dubois, MD  hydrALAZINE (APRESOLINE) 25 MG tablet TAKE 1 TABLET BY MOUTH 3 TIMES A DAY 07/19/14   Josue Hector, MD  isosorbide mononitrate (IMDUR) 60 MG 24 hr tablet TAKE 2 TABLETS BY MOUTH EVERY DAY 07/08/14   Josue Hector, MD  levothyroxine (SYNTHROID, LEVOTHROID) 100 MCG tablet Take 100 mcg by mouth daily before breakfast.    Historical Provider, MD  loratadine (CLARITIN) 10 MG tablet Take 1 tablet (10 mg total) by mouth daily. 05/12/14   Barton Dubois, MD  lovastatin (MEVACOR) 10 MG tablet Take 10 mg by mouth at bedtime.     Historical Provider, MD  magnesium oxide (MAG-OX) 400 MG tablet Take 400 mg by mouth at bedtime.     Historical Provider, MD  metolazone (ZAROXOLYN) 2.5 MG tablet Take 1  tablet (2.5 mg total) by mouth 2 (two) times a week. Monday and Friday 05/12/14   Barton Dubois, MD  nitroGLYCERIN (NITROSTAT) 0.4 MG SL tablet Place 0.4 mg under the tongue every 5 (five) minutes as needed for chest pain.    Historical Provider, MD  ondansetron (ZOFRAN) 4 MG tablet Take 1 tablet (4 mg total) by mouth every 6 (six) hours. 07/01/14   Britt Bottom, NP  oxyCODONE-acetaminophen (PERCOCET/ROXICET) 5-325 MG per tablet Take 1 tablet by mouth every 6 (six) hours as needed for severe pain. 07/30/14   Monica Shamsid-Deen Carter, DO  pantoprazole (PROTONIX) 40 MG tablet Take 40 mg by mouth 2 (two) times daily.     Historical Provider, MD  potassium chloride SA (K-DUR,KLOR-CON) 20 MEQ tablet Take 40 mEq by mouth 2 (two) times daily.  11/22/13   Liliane Shi, PA-C  promethazine (PHENERGAN) 25 MG tablet Take 25 mg by mouth every 8 (eight) hours as needed for nausea or vomiting.  07/17/14   Historical Provider, MD  silver sulfADIAZINE (SILVADENE) 1 % cream Apply 1 application topically daily. For irration on hip per family member    Historical Provider, MD  triamcinolone cream (KENALOG) 0.1 % Apply 1 application topically 2 (two) times daily as needed (for rash).  07/17/14   Historical Provider, MD  warfarin (COUMADIN) 2 MG tablet Take 2-3 mg by mouth daily at 6 PM. Takes 1 tablets (3mg ) on Mondays and Fridays.  Takes 1 tablet (2mg ) on all remaining days.    Historical Provider, MD   BP 144/37 mmHg  Pulse 63  Temp(Src) 98.1 F (36.7 C) (Oral)  Resp 25  SpO2 98% Physical Exam  Constitutional: She is oriented to person, place, and time. She appears well-developed and well-nourished.  Non-toxic appearance. No distress.  HENT:  Head: Normocephalic and atraumatic.  Eyes: Conjunctivae, EOM and lids are normal. Pupils are equal, round, and reactive to light.  Neck: Normal range  of motion. Neck supple. No tracheal deviation present. No thyroid mass present.  Cardiovascular: Normal rate and  normal heart sounds.  An irregularly irregular rhythm present. Exam reveals no gallop.   No murmur heard. Pulmonary/Chest: Effort normal and breath sounds normal. No stridor. No respiratory distress. She has no decreased breath sounds. She has no wheezes. She has no rhonchi. She has no rales.  Abdominal: Soft. Normal appearance and bowel sounds are normal. She exhibits no distension. There is no tenderness. There is no rigidity, no rebound, no guarding and no CVA tenderness.  Genitourinary:  Bloody stools noted  Musculoskeletal: Normal range of motion. She exhibits no edema or tenderness.  Neurological: She is alert and oriented to person, place, and time. She has normal strength. No cranial nerve deficit or sensory deficit. GCS eye subscore is 4. GCS verbal subscore is 5. GCS motor subscore is 6.  Skin: Skin is warm and dry. No abrasion and no rash noted.  Psychiatric: She has a normal mood and affect. Her speech is normal and behavior is normal.  Nursing note and vitals reviewed.   ED Course  Procedures (including critical care time) Labs Review Labs Reviewed  CBC WITH DIFFERENTIAL  COMPREHENSIVE METABOLIC PANEL  PROTIME-INR  APTT  LIPASE, BLOOD  TYPE AND SCREEN    Imaging Review No results found.   EKG Interpretation   Date/Time:  Thursday August 07 2014 09:20:29 EST Ventricular Rate:  64 PR Interval:    QRS Duration: 95 QT Interval:  483 QTC Calculation: 498 R Axis:   -93 Text Interpretation:  Afib/flutter and ventricular-paced rhythm No further  analysis attempted due to paced rhythm No significant change since last  tracing Confirmed by Biruk Troia  MD, Victor Langenbach ([id number]) on 08/07/2014 9:25:49 AM      MDM   Final diagnoses:  None     Patient has had blood ordered 2 units of packed red blood cells and will be admitted by the hospitalist service   Leota Jacobsen, MD 08/07/14 1036

## 2014-08-07 NOTE — ED Notes (Signed)
Per EMS-pt here with c/o of 1 episode of coffee ground emesis and 3 episodes of blood diarrhea. Hx of same.

## 2014-08-07 NOTE — H&P (Signed)
[facility] History and Physical  [name] IZT:[id number] DOB: 10/02/1934 DOA: 08/07/2014  Referring physician: EDP PCP: Tamsen Roers, MD   Chief Complaint: blood per rectum and vomiting blood.   HPI: [name] is a 79 y.o. female with h/o CAD, atrial fibrillation on coumadin, hypertension, hyperlipidemia, stage 4 CKD,  h/o GI bleed from NSAID use  Int he past , recently admitted and discharged for evaluation of GI bleed comes back for blood per rectum and vomiting blood since last night. Her hemoglobin on admission is 8.3. Its a drop from a hemoglobin of 10. Her INR is 3.8.   Rectal exam in ED showed bloody stool.  She is from [facility]. She also reports pain in the right knee since one month. She is referred to medical service for admission. Gastroenterology consulted.    Review of Systems:  Constitutional:  No weight loss, night sweats, Fevers, chills, fatigue.  HEENT:  No headaches, Difficulty swallowing,Tooth/dental problems,Sore throat,  No sneezing, itching, ear ache, nasal congestion, post nasal drip,  Cardio-vascular:  No chest pain, Orthopnea, PND, swelling in lower extremities, anasarca, dizziness, palpitations  GI:  See HPI otherwise negative.  Resp:  No shortness of breath with exertion or at rest. No excess mucus, no productive cough, No non-productive cough, No coughing up of blood.No change in color of mucus.No wheezing.No chest wall deformity  Skin:  no rash or lesions.  GU:  no dysuria, change in color of urine, no urgency or frequency. No flank pain.  Musculoskeletal:  trace pedal edema.   Past Medical History  Diagnosis Date  . Cellulitis and abscess of unspecified site     a. h/o MSRA cellulitis of abdominal wall.  Marland Kitchen CAD (coronary artery disease)     a. DES to OM 01/2005. b. Rotational atherotomy/DES to prox LAD 02/2008. c. Cath 07/2013: stable, for med rx.  . Edema   . Warfarin anticoagulation     a. Followed by PCP.  Marland Kitchen GI bleed     a.  EGD 01/2007: antral ulcers related to NSAID/aspirin use.  . Peripheral vascular disease   . Hyperthyroidism     a. Hx of intolerance to PTU.  Marland Kitchen Blood transfusion years ago    "heart problems" (09/26/2013)  . H/O hiatal hernia   . Umbilical hernia     unrepaired (11/24/11)  . Valvular heart disease     Echo 11/2011: mild-mod MR.  Marland Kitchen Hypertension     takes Amlodipine,Hydralazine,and Imdur daily  . Hyperlipidemia     takes Lovastatin daily  . SSS (sick sinus syndrome)     a. H/o AV node ablation 2008 secondary to difficult to control afib/flutter, with subsequent pacer.  . Atrial fibrillation with rapid ventricular response     a. H/o amiodarone thyroiditis. b. s/p AV node ablation 2008 with implantation of PPM 01/2007, upgraded to CRT-P 06/2007.;takes Coumadin daily  . GERD (gastroesophageal reflux disease)     takes Protonix daily  . Chronic diastolic CHF (congestive heart failure)     takes Lasix daily and Spironolactone   . Pacemaker     [employer]tronic  . History of gout   . Hemorrhoids   . Depression with anxiety     takes Xanax daily  . Insomnia     takes Xanax if needed  . Heart murmur   . CKD (chronic kidney disease)     Cr ~2.3.  . Kidney stones   . Myocardial infarction     "I've had 2-3; last one  was in /1996" (09/26/2013)  . Migraines     "last one was a pretty good while ago" (09/26/2013)  . Arthritis     "all over" (09/26/2013)  . Chronic lower back pain     deteriorating disc  . Iron deficiency anemia     takes an Iron Pill daily  . Pneumonia    Past Surgical History  Procedure Laterality Date  . Esophagogastroduodenoscopy    . Tonsillectomy  ~ 1960  . Laminectomy and microdiscectomy lumbar spine  08/2003  . Cardioversion  10/2004; 02/2007    /E-chart  . Cholecystectomy    . Appendectomy  1954  . Vaginal hysterectomy  ?1981  . Av node ablation  02/2007    /E-chart  . Insert / replace / remove pacemaker  01/2007    initial placement  . Insert / replace / remove  pacemaker  06/2007    upgraded/E-chart  . Av fistula placement, brachiocephalic Right 0/1779    "didn't work; my veins were too small"  . Joint replacement    . Total knee arthroplasty Left 01/2009  . Bladder and bowel  1970's    "tacked; damaged my intestines & had to remove some; done @ [facility]  . Cataract extraction w/ intraocular lens  implant, bilateral Bilateral   . Bascilic vein transposition Right 06/21/2013    Procedure: [place];  Surgeon: Serafina Mitchell, MD;  Location: [facility] OR;  Service: Vascular;  Laterality: Right;  . Esophagogastroduodenoscopy N/A 07/19/2013    Procedure: ESOPHAGOGASTRODUODENOSCOPY (EGD);  Surgeon: Lafayette Dragon, MD;  Location: [facility] ENDOSCOPY;  Service: Endoscopy;  Laterality: N/A;  . Balloon dilation N/A 07/19/2013    Procedure: BALLOON DILATION;  Surgeon: Lafayette Dragon, MD;  Location: [facility] ENDOSCOPY;  Service: Endoscopy;  Laterality: N/A;  . Esophagogastroduodenoscopy N/A 07/20/2013    Procedure: ESOPHAGOGASTRODUODENOSCOPY (EGD);  Surgeon: Lafayette Dragon, MD;  Location: [facility] ENDOSCOPY;  Service: Endoscopy;  Laterality: N/A;  with removal of polyp in pylorus  . Bascilic vein transposition Right 09/20/2013    Procedure: 2ND STAGE RIGHT BASCILIC VEIN TRANSPOSITION; ULTRSOUND GUIDED;  Surgeon: Serafina Mitchell, MD;  Location: [facility] OR;  Service: Vascular;  Laterality: Right;  . Cardiac catheterization  2006; 2008; 2009; 01/13/2011  . Coronary angioplasty with stent placement  ~ 2007; ?    "had a total of 2 stents put in" 92/07/2014)  . Back surgery    . Left heart catheterization with coronary angiogram N/A 07/15/2013    Procedure: LEFT HEART CATHETERIZATION WITH CORONARY ANGIOGRAM;  Surgeon: Blane Ohara, MD;  Location: [facility] CATH LAB;  Service: Cardiovascular;  Laterality: N/A;  . Esophagogastroduodenoscopy N/A 07/25/2014    Procedure: ESOPHAGOGASTRODUODENOSCOPY (EGD);  Surgeon: Irene Shipper, MD;  Location: [facility] ENDOSCOPY;  Service: Endoscopy;   Laterality: N/A;   Social History:  reports that she quit smoking about 19 years ago. Her smoking use included Cigarettes. She has a 50 pack-year smoking history. She has never used smokeless tobacco. She reports that she does not drink alcohol or use illicit drugs.  Allergies  Allergen Reactions  . Amiodarone Hives  . Propylthiouracil Rash    Family History  Problem Relation Age of Onset  . Colon cancer Neg Hx   . Thyroid disease Neg Hx   . Stroke Brother   . Hypertension Brother   . Kidney disease Brother   . Kidney disease Brother   . Other Mother     Sepsis and perforated colon  . Kidney  disease Mother   . Other Father     Motor vehicle accident  . Learning disabilities Son   . Kidney disease Maternal Aunt   . Kidney disease Maternal Grandmother      Prior to Admission medications   Medication Sig Start Date End Date Taking? Authorizing Provider  ALPRAZolam Duanne Moron) 0.5 MG tablet Take 1 tablet (0.5 mg total) by mouth at bedtime as needed for anxiety or sleep. 07/30/14  Yes Monica Viviano Simas, DO  aspirin 81 MG tablet Take 81 mg by mouth daily.   Yes Historical Provider, MD  camphor-menthol Timoteo Ace) lotion Apply topically as needed for itching. 05/12/14  Yes Barton Dubois, MD  colchicine 0.6 MG tablet Take 0.6 mg by mouth 2 (two) times daily as needed (Gout).  05/20/14  Yes Historical Provider, MD  ergocalciferol (VITAMIN D2) 50000 UNITS capsule Take 50,000 Units by mouth every Saturday. Take on saturdays   Yes Historical Provider, MD  Fluticasone-Salmeterol (ADVAIR DISKUS) 100-50 MCG/DOSE AEPB Inhale 1 puff into the lungs 2 (two) times daily. 01/27/14  Yes Brand Males, MD  furosemide (LASIX) 80 MG tablet Take 1 tablet (80 mg total) by mouth 2 (two) times daily. 05/12/14  Yes Barton Dubois, MD  hydrALAZINE (APRESOLINE) 25 MG tablet TAKE 1 TABLET BY MOUTH 3 TIMES A DAY 07/19/14  Yes Josue Hector, MD  isosorbide mononitrate (IMDUR) 60 MG 24 hr tablet TAKE 2 TABLETS BY  MOUTH EVERY DAY 07/08/14  Yes Josue Hector, MD  levothyroxine (SYNTHROID, LEVOTHROID) 100 MCG tablet Take 100 mcg by mouth daily before breakfast.   Yes Historical Provider, MD  loratadine (CLARITIN) 10 MG tablet Take 1 tablet (10 mg total) by mouth daily. 05/12/14  Yes Barton Dubois, MD  lovastatin (MEVACOR) 10 MG tablet Take 10 mg by mouth at bedtime.    Yes Historical Provider, MD  magnesium oxide (MAG-OX) 400 MG tablet Take 400 mg by mouth at bedtime.    Yes Historical Provider, MD  metolazone (ZAROXOLYN) 2.5 MG tablet Take 1 tablet (2.5 mg total) by mouth 2 (two) times a week. Monday and Friday 05/12/14  Yes Barton Dubois, MD  nitroGLYCERIN (NITROSTAT) 0.4 MG SL tablet Place 0.4 mg under the tongue every 5 (five) minutes as needed for chest pain.   Yes Historical Provider, MD  ondansetron (ZOFRAN) 4 MG tablet Take 1 tablet (4 mg total) by mouth every 6 (six) hours. 07/01/14  Yes Britt Bottom, NP  oxyCODONE-acetaminophen (PERCOCET/ROXICET) 5-325 MG per tablet Take 1 tablet by mouth every 6 (six) hours as needed for severe pain. 07/30/14  Yes Monica [employer], DO  pantoprazole (PROTONIX) 40 MG tablet Take 40 mg by mouth 2 (two) times daily.    Yes Historical Provider, MD  potassium chloride SA (K-DUR,KLOR-CON) 20 MEQ tablet Take 40 mEq by mouth 2 (two) times daily.  11/22/13  Yes Scott T Kathlen Mody, PA-C  promethazine (PHENERGAN) 25 MG tablet Take 25 mg by mouth every 8 (eight) hours as needed for nausea or vomiting.  07/17/14  Yes Historical Provider, MD  silver sulfADIAZINE (SILVADENE) 1 % cream Apply 1 application topically daily. For irration on hip per family member   Yes Historical Provider, MD  triamcinolone cream (KENALOG) 0.1 % Apply 1 application topically 2 (two) times daily as needed (for rash).  07/17/14  Yes Historical Provider, MD  warfarin (COUMADIN) 2 MG tablet Take 2-3 mg by mouth daily at 6 PM. Takes 2mg  everyday except 3mg  on Monday's and Friday's   Yes Historical  Provider, MD   Physical Exam: Filed Vitals:   08/07/14 3893 08/07/14 0956 08/07/14 1150  BP: 144/37 136/47 153/38  Pulse: 63 64 61  Temp: 98.1 F (36.7 C)    TempSrc: Oral  Oral  Resp: 25 18 18   SpO2: 98% 100% 100%    Wt Readings from Last 3 Encounters:  07/23/14 86.3 kg (190 lb 4.1 oz)  05/19/14 89.812 kg (198 lb)  05/12/14 90.765 kg (200 lb 1.6 oz)    General:  Appears calm and comfortable Eyes: PERRL, normal lids, irises & conjunctiva Neck: no LAD, masses or thyromegaly Cardiovascular: RRR, no m/r/g. No LE edema. Respiratory: CTA bilaterally, no w/r/r. Normal respiratory effort. Abdomen: soft, ntnd Skin: no rash or induration seen on limited exam Musculoskeletal: trace pedal edema.  Neurologic: pt slightly confused, is alert . Speech normal. No facial droop. Able to move all extremities .           Labs on Admission:  Basic Metabolic Panel:  Recent Labs Lab 08/07/14 0933  NA 140  K 3.8  CL 108  CO2 24  GLUCOSE 92  BUN 99*  CREATININE 2.86*  CALCIUM 8.6   Liver Function Tests:  Recent Labs Lab 08/07/14 0933  AST 53*  ALT 24  ALKPHOS 82  BILITOT 2.0*  PROT 6.2  ALBUMIN 2.4*    Recent Labs Lab 08/07/14 0933  LIPASE 40   No results for input(s): AMMONIA in the last 168 hours. CBC:  Recent Labs Lab 08/07/14 0933  WBC 6.4  NEUTROABS 5.1  HGB 8.3*  HCT 25.4*  MCV 91.0  PLT 202   Cardiac Enzymes: No results for input(s): CKTOTAL, CKMB, CKMBINDEX, TROPONINI in the last 168 hours.  BNP (last 3 results)  Recent Labs  10/28/13 1450 05/07/14 1659 05/10/14 0508  PROBNP 1368.0* 11688.0* 12366.0*   CBG: No results for input(s): GLUCAP in the last 168 hours.  Radiological Exams on Admission: No results found.  EKG: afib/ flutter rate at 64/min.   Assessment/Plan Active Problems:   GI bleed   BRBPR and hematemesis: unclear etiology, possibly mucosal bleed from supratherapeutic INR.  Her EGD on last admission showed esophageal  stricture. Last colonoscopy was in 2011.  Admitted to telemetry.  Started her on IV protonix, NPO .  Gastroenterology consulted and recommendations given.  Holding coumadin. Coumadin reversal with vitamin K AND FFP.  Repeat INR in am.  H&H every 8 hours.   Hypothyroidism: Resume synthroid.    Hypertension: BP well controlled.   CAD/ atrial fibrillation: She denies any chest pain or sob. Resume medications orther than aspirin and coumadin. Keep hemoglobin greater than 9.  Rate controlled atrial fibrillation.    Right knee pain and swelling: -x ray of the knee will be ordered and pain control.       Code Status: DNR DVT Prophylaxis: Family Communication:DISCUSSED with family members at bedside Disposition Plan: admitted to telemetry.   Time spent: 55 min  [facility] Pager [phone]

## 2014-08-07 NOTE — Progress Notes (Signed)
UR completed 

## 2014-08-07 NOTE — Consult Note (Signed)
Referring Provider: No ref. provider found Primary Care Physician:  Tamsen Roers, MD Primary Gastroenterologist:  Dr. Ardis Hughs  Reason for Consultation:  Gastrointestinal bleeding  HPI: [name] is a 79 y.o. female with extensive PMH including being on coumadin for sick sinus syndrome for which she also has a pacemaker.  She has history of GI bleeding in 2008 from ulcers (secondary to NSAID's).  Was also recently admitted to the hospital 2 weeks ago with black stools and hematemesis in the setting of supratherapeutic INR at 6.  This was reversed and she underwent EGD on 07/25/2014 by Dr. Henrene Pastor, which showed a distal esophageal ring, but was otherwise normal.  She did not undergo any further GI evaluation at that time.  Last colonoscopy was in 08/2009 by Dr. Ardis Hughs, which was normal except for external hemorrhoids.  She presents to [facility] hospital today with complaints of black stools and hematemesis.  Says that she is unsure how long this has been going on this occasion because she has her days mixed up; thinks it has been a couple of days.  She is not a great historian.  Hgb is 8.3 grams, down from 10.0 grams just 9 days ago.  INR is 3.8 so she is going to receive some Vitamin K and FFP to help reverse that.  She is hemodynamically stable.  I believe that she is going to receive two units of PRBC's as well.   Past Medical History  Diagnosis Date  . Cellulitis and abscess of unspecified site     a. h/o MSRA cellulitis of abdominal wall.  Marland Kitchen CAD (coronary artery disease)     a. DES to OM 01/2005. b. Rotational atherotomy/DES to prox LAD 02/2008. c. Cath 07/2013: stable, for med rx.  . Edema   . Warfarin anticoagulation     a. Followed by PCP.  Marland Kitchen GI bleed     a. EGD 01/2007: antral ulcers related to NSAID/aspirin use.  . Peripheral vascular disease   . Hyperthyroidism     a. Hx of intolerance to PTU.  Marland Kitchen Blood transfusion years ago    "heart problems" (09/26/2013)  . H/O hiatal hernia     . Umbilical hernia     unrepaired (11/24/11)  . Valvular heart disease     Echo 11/2011: mild-mod MR.  Marland Kitchen Hypertension     takes Amlodipine,Hydralazine,and Imdur daily  . Hyperlipidemia     takes Lovastatin daily  . SSS (sick sinus syndrome)     a. H/o AV node ablation 2008 secondary to difficult to control afib/flutter, with subsequent pacer.  . Atrial fibrillation with rapid ventricular response     a. H/o amiodarone thyroiditis. b. s/p AV node ablation 2008 with implantation of PPM 01/2007, upgraded to CRT-P 06/2007.;takes Coumadin daily  . GERD (gastroesophageal reflux disease)     takes Protonix daily  . Chronic diastolic CHF (congestive heart failure)     takes Lasix daily and Spironolactone   . Pacemaker     [employer]  . History of gout   . Hemorrhoids   . Depression with anxiety     takes Xanax daily  . Insomnia     takes Xanax if needed  . Heart murmur   . CKD (chronic kidney disease)     Cr ~2.3.  . Kidney stones   . Myocardial infarction     "I've had 2-3; last one was in /1996" (09/26/2013)  . Migraines     "last one was a pretty good while  ago" (09/26/2013)  . Arthritis     "all over" (09/26/2013)  . Chronic lower back pain     deteriorating disc  . Iron deficiency anemia     takes an Iron Pill daily  . Pneumonia     Past Surgical History  Procedure Laterality Date  . Esophagogastroduodenoscopy    . Tonsillectomy  ~ 1960  . Laminectomy and microdiscectomy lumbar spine  08/2003  . Cardioversion  10/2004; 02/2007    /E-chart  . Cholecystectomy    . Appendectomy  1954  . Vaginal hysterectomy  ?1981  . Av node ablation  02/2007    /E-chart  . Insert / replace / remove pacemaker  01/2007    initial placement  . Insert / replace / remove pacemaker  06/2007    upgraded/E-chart  . Av fistula placement, brachiocephalic Right 04/9370    "didn't work; my veins were too small"  . Joint replacement    . Total knee arthroplasty Left 01/2009  . Bladder and bowel  1970's     "tacked; damaged my intestines & had to remove some; done @ [facility]  . Cataract extraction w/ intraocular lens  implant, bilateral Bilateral   . Bascilic vein transposition Right 06/21/2013    Procedure: [place];  Surgeon: Serafina Mitchell, MD;  Location: [place] OR;  Service: Vascular;  Laterality: Right;  . Esophagogastroduodenoscopy N/A 07/19/2013    Procedure: ESOPHAGOGASTRODUODENOSCOPY (EGD);  Surgeon: Lafayette Dragon, MD;  Location: [facility] ENDOSCOPY;  Service: Endoscopy;  Laterality: N/A;  . Balloon dilation N/A 07/19/2013    Procedure: BALLOON DILATION;  Surgeon: Lafayette Dragon, MD;  Location: [facility] ENDOSCOPY;  Service: Endoscopy;  Laterality: N/A;  . Esophagogastroduodenoscopy N/A 07/20/2013    Procedure: ESOPHAGOGASTRODUODENOSCOPY (EGD);  Surgeon: Lafayette Dragon, MD;  Location: [facility] ENDOSCOPY;  Service: Endoscopy;  Laterality: N/A;  with removal of polyp in pylorus  . Bascilic vein transposition Right 09/20/2013    Procedure: 2ND STAGE RIGHT BASCILIC VEIN TRANSPOSITION; ULTRSOUND GUIDED;  Surgeon: Serafina Mitchell, MD;  Location: [facility] OR;  Service: Vascular;  Laterality: Right;  . Cardiac catheterization  2006; 2008; 2009; 01/13/2011  . Coronary angioplasty with stent placement  ~ 2007; ?    "had a total of 2 stents put in" 92/07/2014)  . Back surgery    . Left heart catheterization with coronary angiogram N/A 07/15/2013    Procedure: LEFT HEART CATHETERIZATION WITH CORONARY ANGIOGRAM;  Surgeon: Blane Ohara, MD;  Location[facility] CATH LAB;  Service: Cardiovascular;  Laterality: N/A;  . Esophagogastroduodenoscopy N/A 07/25/2014    Procedure: ESOPHAGOGASTRODUODENOSCOPY (EGD);  Surgeon: Irene Shipper, MD;  Location: [facility] ENDOSCOPY;  Service: Endoscopy;  Laterality: N/A;    Prior to Admission medications   Medication Sig Start Date End Date Taking? Authorizing Provider  ALPRAZolam Duanne Moron) 0.5 MG tablet Take 1 tablet (0.5 mg total) by mouth at bedtime as needed for anxiety or  sleep. 07/30/14  Yes Monica Viviano Simas, DO  aspirin 81 MG tablet Take 81 mg by mouth daily.   Yes Historical Provider, MD  camphor-menthol Timoteo Ace) lotion Apply topically as needed for itching. 05/12/14  Yes Barton Dubois, MD  colchicine 0.6 MG tablet Take 0.6 mg by mouth 2 (two) times daily as needed (Gout).  05/20/14  Yes Historical Provider, MD  ergocalciferol (VITAMIN D2) 50000 UNITS capsule Take 50,000 Units by mouth every Saturday. Take on saturdays   Yes Historical Provider, MD  Fluticasone-Salmeterol (ADVAIR DISKUS) 100-50 MCG/DOSE AEPB Inhale 1  puff into the lungs 2 (two) times daily. 01/27/14  Yes Brand Males, MD  furosemide (LASIX) 80 MG tablet Take 1 tablet (80 mg total) by mouth 2 (two) times daily. 05/12/14  Yes Barton Dubois, MD  hydrALAZINE (APRESOLINE) 25 MG tablet TAKE 1 TABLET BY MOUTH 3 TIMES A DAY 07/19/14  Yes Josue Hector, MD  isosorbide mononitrate (IMDUR) 60 MG 24 hr tablet TAKE 2 TABLETS BY MOUTH EVERY DAY 07/08/14  Yes Josue Hector, MD  levothyroxine (SYNTHROID, LEVOTHROID) 100 MCG tablet Take 100 mcg by mouth daily before breakfast.   Yes Historical Provider, MD  loratadine (CLARITIN) 10 MG tablet Take 1 tablet (10 mg total) by mouth daily. 05/12/14  Yes Barton Dubois, MD  lovastatin (MEVACOR) 10 MG tablet Take 10 mg by mouth at bedtime.    Yes Historical Provider, MD  magnesium oxide (MAG-OX) 400 MG tablet Take 400 mg by mouth at bedtime.    Yes Historical Provider, MD  metolazone (ZAROXOLYN) 2.5 MG tablet Take 1 tablet (2.5 mg total) by mouth 2 (two) times a week. Monday and Friday 05/12/14  Yes Barton Dubois, MD  nitroGLYCERIN (NITROSTAT) 0.4 MG SL tablet Place 0.4 mg under the tongue every 5 (five) minutes as needed for chest pain.   Yes Historical Provider, MD  ondansetron (ZOFRAN) 4 MG tablet Take 1 tablet (4 mg total) by mouth every 6 (six) hours. 07/01/14  Yes Britt Bottom, NP  oxyCODONE-acetaminophen (PERCOCET/ROXICET) 5-325 MG per tablet Take 1  tablet by mouth every 6 (six) hours as needed for severe pain. 07/30/14  Yes Monica [employer], DO  pantoprazole (PROTONIX) 40 MG tablet Take 40 mg by mouth 2 (two) times daily.    Yes Historical Provider, MD  potassium chloride SA (K-DUR,KLOR-CON) 20 MEQ tablet Take 40 mEq by mouth 2 (two) times daily.  11/22/13  Yes Scott T Kathlen Mody, PA-C  promethazine (PHENERGAN) 25 MG tablet Take 25 mg by mouth every 8 (eight) hours as needed for nausea or vomiting.  07/17/14  Yes Historical Provider, MD  silver sulfADIAZINE (SILVADENE) 1 % cream Apply 1 application topically daily. For irration on hip per family member   Yes Historical Provider, MD  triamcinolone cream (KENALOG) 0.1 % Apply 1 application topically 2 (two) times daily as needed (for rash).  07/17/14  Yes Historical Provider, MD  warfarin (COUMADIN) 2 MG tablet Take 2-3 mg by mouth daily at 6 PM. Takes 2mg  everyday except 3mg  on Monday's and Friday's   Yes Historical Provider, MD    Current Facility-Administered Medications  Medication Dose Route Frequency Provider Last Rate Last Dose  . 0.9 %  sodium chloride infusion   Intravenous Continuous Hosie Poisson, MD 125 mL/hr at 08/07/14 0953    . 0.9 %  sodium chloride infusion   Intravenous Once Leota Jacobsen, MD      . 0.9 %  sodium chloride infusion   Intravenous Once Hosie Poisson, MD      . phytonadione (VITAMIN K) tablet 5 mg  5 mg Oral NOW Hosie Poisson, MD        Allergies as of 08/07/2014 - Review Complete 08/07/2014  Allergen Reaction Noted  . Amiodarone Hives   . Propylthiouracil Rash 11/23/2011    Family History  Problem Relation Age of Onset  . Colon cancer Neg Hx   . Thyroid disease Neg Hx   . Stroke Brother   . Hypertension Brother   . Kidney disease Brother   . Kidney disease Brother   .  Other Mother     Sepsis and perforated colon  . Kidney disease Mother   . Other Father     Motor vehicle accident  . Learning disabilities Son   . Kidney disease Maternal Aunt    . Kidney disease Maternal Grandmother     History   Social History  . Marital Status: Widowed    Spouse Name: N/A    Number of Children: N/A  . Years of Education: N/A   Occupational History  . Not on file.   Social History Main Topics  . Smoking status: Former Smoker -- 1.00 packs/day for 50 years    Types: Cigarettes    Quit date: 07/13/1995  . Smokeless tobacco: Never Used     Comment: quit smoking around 1996  . Alcohol Use: No  . Drug Use: No  . Sexual Activity: No   Other Topics Concern  . Not on file   Social History Narrative   Lives in [place].    Review of Systems: Ten point ROS is O/W negative except as mentioned in HPI.  Physical Exam: Vital signs in last 24 hours: Temp:  [98.1 F (36.7 C)] 98.1 F (36.7 C) (12/24 0918) Pulse Rate:  [61-64] 61 (12/24 1150) Resp:  [18-25] 18 (12/24 1150) BP: (136-153)/(37-47) 153/38 mmHg (12/24 1150) SpO2:  [98 %-100 %] 100 % (12/24 1150)   General:  Alert, Well-developed, well-nourished, pleasant and cooperative in NAD Head:  Normocephalic and atraumatic. Eyes:  Sclera clear, no icterus.  Conjunctiva pink. Ears:  Normal auditory acuity. Mouth:  No deformity or lesions.   Lungs:  Clear throughout to auscultation.  No wheezes, crackles, or rhonchi.  Heart:  Regular rate and rhythm; 2/6 systolic murmur Abdomen:  Soft, non-distended.  BS present.  Non-tender. Fullness, questionable mass right perimubilical area Rectal:  Black stools with dark maroon color noted on exam glove; strongly occult blood positive. Msk:  Symmetrical without gross deformities. Pulses:  Normal pulses noted. Extremities:  Without clubbing or edema. Neurologic:  Alert and  oriented x4;  grossly normal neurologically. Skin:  Intact without significant lesions or rashes. Psych:  Alert and cooperative. Normal mood and affect.  Lab Results:  Recent Labs  08/07/14 0933  WBC 6.4  HGB 8.3*  HCT 25.4*  PLT 202   BMET  Recent Labs   08/07/14 0933  NA 140  K 3.8  CL 108  CO2 24  GLUCOSE 92  BUN 99*  CREATININE 2.86*  CALCIUM 8.6   LFT  Recent Labs  08/07/14 0933  PROT 6.2  ALBUMIN 2.4*  AST 53*  ALT 24  ALKPHOS 82  BILITOT 2.0*   PT/INR  Recent Labs  08/07/14 0933  LABPROT 37.7*  INR 3.80*   IMPRESSION:  -Gastrointestinal bleeding in the setting of coumadin coagulopathy. Stools are black with deep maroon color as well and strongly FOBT positive on rectal exam.  She endorses hematemesis as well.  Recent EGD normal within the past couple of weeks.  Differential includes right sided colon AVM's vs small bowel source and other etiologies.   -Supratherapeutic INR:  3.8 on admission.  Receiving Vitamin K and FFP. -Acute blood loss anemia:  Going to receive 2 units PRBC's. -Multiple medical problems  PLAN: -Agree with reversing INR. -Monitor Hgb and transfuse further if needed. -Will discuss with Dr. Deatra Ina regarding endoscopic evaluation.  With recent normal EGD we may want to perform colonoscopy as next step but with reports of hematemesis ? if we should perform enteroscopy  first?  ZEHR, JESSICA D.  08/07/2014, 12:01 PM  Pager number [phone]  GI Attending Note   Chart was reviewed and patient was examined. X-rays and lab were reviewed.    Pt has had recurrent GI bleeding in the face of supratheraputic INR.  Can see spontaneous mucosal bleeding under these circumstances.  H/o hematemasis strongly suggests UGI source though recent EGD was negative.  A proximal small bowel bleeding source, less likely colonic bleeding must also be considered.  Dielofoy's ulcer must also be considered. Palpable abdominal mass is probably her polycystic kidney.  Recommend observation while correcting INR.  If she continues to bleed would repeat EGD.  Would empirically Rx with a PPI.  Further studies pending clinical course.  Sandy Salaam. Deatra Ina, M.D., [facility] Gastroenterology Cell [phone]

## 2014-08-07 NOTE — ED Notes (Signed)
Blood Bank called.

## 2014-08-07 NOTE — ED Notes (Signed)
Blood bank stated that blood is ready, per charge get patient up to floor.

## 2014-08-07 NOTE — ED Notes (Signed)
Bed: DB52 Expected date:  Expected time:  Means of arrival:  Comments: EMS- GI Bleed

## 2014-08-08 ENCOUNTER — Inpatient Hospital Stay (HOSPITAL_COMMUNITY): Payer: Medicare Other

## 2014-08-08 DIAGNOSIS — K921 Melena: Secondary | ICD-10-CM | POA: Insufficient documentation

## 2014-08-08 LAB — HEMOGLOBIN AND HEMATOCRIT, BLOOD
HCT: 24.4 % — ABNORMAL LOW (ref 36.0–46.0)
HCT: 21.6 % — ABNORMAL LOW (ref 36.0–46.0)
HEMOGLOBIN: 8.1 g/dL — AB (ref 12.0–15.0)
Hemoglobin: 7.1 g/dL — ABNORMAL LOW (ref 12.0–15.0)

## 2014-08-08 LAB — PROTIME-INR
INR: 2.27 — AB (ref 0.00–1.49)
Prothrombin Time: 25.2 seconds — ABNORMAL HIGH (ref 11.6–15.2)

## 2014-08-08 LAB — PREPARE RBC (CROSSMATCH)

## 2014-08-08 MED ORDER — SODIUM CHLORIDE 0.9 % IV SOLN
Freq: Once | INTRAVENOUS | Status: AC
  Administered 2014-08-08: 13:00:00 via INTRAVENOUS

## 2014-08-08 MED ORDER — SODIUM CHLORIDE 0.9 % IV SOLN
Freq: Once | INTRAVENOUS | Status: AC
  Administered 2014-08-09: 250 mL via INTRAVENOUS

## 2014-08-08 MED ORDER — SODIUM CHLORIDE 0.9 % IV SOLN: Freq: Once | INTRAVENOUS | Status: DC

## 2014-08-08 NOTE — Progress Notes (Signed)
Blood drawn by ICU nurse

## 2014-08-08 NOTE — Progress Notes (Signed)
Dark red blood noted per rectum in bedpan x3 episodes this shift. Laboratory staff unable to obtain venous blood for H&H and PT-INR. Respiratory staff unable to obtain arterial blood, either. Alvy Beal, RN unsuccessful in attempt to obtain blood sample from IV site.  Dr. Karleen Hampshire made aware. Pt. Transferred to room 1234 as per Dr. Petra Kuba order to transfer to [facility] ICU. ICU RN notified of above. Lind Guest, RN

## 2014-08-08 NOTE — Progress Notes (Signed)
     Progress Note   Subjective  **No bowel movement overnight*   Objective  Vital signs in last 24 hours: Temp:  [97.2 F (36.2 C)-98.3 F (36.8 C)] 97.7 F (36.5 C) (12/25 0525) Pulse Rate:  [59-64] 61 (12/25 0525) Resp:  [14-20] 18 (12/25 0525) BP: (115-153)/(29-47) 115/38 mmHg (12/25 0525) SpO2:  [95 %-100 %] 96 % (12/25 0750) Last BM Date: 08/07/14 General:   Alert,  Well-developed,   in NAD Heart:  Regular rate and rhythm; no murmurs Abdomen:  Soft, nontender and nondistended. Normal bowel sounds, without guarding, and without rebound.   Extremities:  Without edema. Neurologic:  Alert and  oriented x4;  grossly normal neurologically. Psych:  Alert and cooperative. Normal mood and affect.  Intake/Output from previous day: 12/24 0701 - 12/25 0700 In: 1405 [Blood:1405] Out: 1150 [Urine:1150] Intake/Output this shift: Total I/O In: -  Out: 300 [Urine:300]  Lab Results:  Recent Labs  08/07/14 0933 08/08/14 0800  WBC 6.4  --   HGB 8.3* 8.1*  HCT 25.4* 24.4*  PLT 202  --    BMET  Recent Labs  08/07/14 0933  NA 140  K 3.8  CL 108  CO2 24  GLUCOSE 92  BUN 99*  CREATININE 2.86*  CALCIUM 8.6   LFT  Recent Labs  08/07/14 0933  PROT 6.2  ALBUMIN 2.4*  AST 53*  ALT 24  ALKPHOS 82  BILITOT 2.0*   PT/INR  Recent Labs  08/07/14 0933  LABPROT 37.7*  INR 3.80*   Hepatitis Panel No results for input(s): HEPBSAG, HCVAB, HEPAIGM, HEPBIGM in the last 72 hours.  Studies/Results: No results found.    Assessment & Plan  *1.  GI bleed in the face of supratherapeutic INR.  No further bleeding.  INR not drawn today.  Plan to hold GI studies unless she has recurrent bleeding.  If bleeding is severe with give FFP.  Okay to feed patient ** Active Problems:   GI bleed   Acute blood loss anemia     LOS: 1 day   Erskine Emery  08/08/2014, 9:32 AM

## 2014-08-08 NOTE — Progress Notes (Addendum)
[facility] PROGRESS NOTE  [name] NLG:[id number] DOB: March 08, 1935 DOA: 08/07/2014 PCP: Tamsen Roers, MD  Assessment/Plan: BRBPR and hematemesis: unclear etiology, possibly mucosal bleed from supratherapeutic INR. Her EGD on last admission showed esophageal stricture. Last colonoscopy was in 2011.  Started her on IV protonix, she was made NPO on admission, over night she didn't have any bleeding or hematemesis, hence she was started on clears today.   Gastroenterology consulted and recommendations given.  Holding coumadin. Coumadin reversal with vitamin K AND FFP given yesterday. rpeat INR pending. .  H&H every 8 hours.     Hypothyroidism: Resume synthroid.    Hypertension: BP well controlled.   CAD/ atrial fibrillation: She denies any chest pain or sob. Resume medications orther than aspirin and coumadin. Keep hemoglobin greater than 9.  Rate controlled atrial fibrillation.    Right knee pain and swelling: -x ray of the knee will be ordered and pain control.   Code Status: DNR Family Communication: NONE at bedside today Disposition Plan: pending.    Consultants:  gastroenterology  Procedures:  none  Antibiotics:  none  HPI/Subjective: Feeling much better, no nausea, or hematemesis. No bloody diarrhea.   Objective: Filed Vitals:   08/08/14 1325  BP: 137/48  Pulse: 61  Temp: 97.7 F (36.5 C)  Resp: 20    Intake/Output Summary (Last 24 hours) at 08/08/14 1557 Last data filed at 08/08/14 1438  Gross per 24 hour  Intake   1915 ml  Output   2050 ml  Net   -135 ml   There were no vitals filed for this visit.  Exam:   General:  Alert afebrile comfortbale  Cardiovascular: s1s2  Respiratory: ctab  Abdomen: soft NT ND BS+  Musculoskeletal: Trace pedal edema.   Data Reviewed: Basic Metabolic Panel:  Recent Labs Lab 08/07/14 0933  NA 140  K 3.8  CL 108  CO2 24  GLUCOSE 92  BUN 99*  CREATININE 2.86*  CALCIUM 8.6    Liver Function Tests:  Recent Labs Lab 08/07/14 0933  AST 53*  ALT 24  ALKPHOS 82  BILITOT 2.0*  PROT 6.2  ALBUMIN 2.4*    Recent Labs Lab 08/07/14 0933  LIPASE 40   No results for input(s): AMMONIA in the last 168 hours. CBC:  Recent Labs Lab 08/07/14 0933 08/08/14 0800  WBC 6.4  --   NEUTROABS 5.1  --   HGB 8.3* 8.1*  HCT 25.4* 24.4*  MCV 91.0  --   PLT 202  --    Cardiac Enzymes: No results for input(s): CKTOTAL, CKMB, CKMBINDEX, TROPONINI in the last 168 hours. BNP (last 3 results)  Recent Labs  10/28/13 1450 05/07/14 1659 05/10/14 0508  PROBNP 1368.0* 11688.0* 12366.0*   CBG: No results for input(s): GLUCAP in the last 168 hours.  Recent Results (from the past 240 hour(s))  MRSA PCR Screening     Status: None   Collection Time: 08/07/14 12:43 PM  Result Value Ref Range Status   MRSA by PCR NEGATIVE NEGATIVE Final    Comment:        The GeneXpert MRSA Assay (FDA approved for NASAL specimens only), is one component of a comprehensive MRSA colonization surveillance program. It is not intended to diagnose MRSA infection nor to guide or monitor treatment for MRSA infections.      Studies: Dg Knee Complete 4 Views Right  08/08/2014   CLINICAL DATA:  [place] 2 weeks go. Patient complains of right anterior knee pain that radiates to  the lateral aspect.  EXAM: RIGHT KNEE - COMPLETE 4+ VIEW  COMPARISON:  None.  FINDINGS: There is diffuse joint space loss throughout the right knee. Evidence for a moderate-sized joint effusion. There is osteophytosis in the knee compartments and evidence for chondrocalcinosis. Negative for an acute fracture or dislocation. There are vascular calcifications.  IMPRESSION: Moderate to severe osteoarthritis in the right knee with a joint effusion. No acute bone abnormality.   Electronically Signed   By: Markus Daft M.D.   On: 08/08/2014 10:06    Scheduled Meds: . sodium chloride   Intravenous Once  . furosemide  80 mg Oral  BID  . isosorbide mononitrate  120 mg Oral Daily  . levothyroxine  100 mcg Oral QAC breakfast  . metolazone  2.5 mg Oral Once per day on Mon Thu  . mometasone-formoterol  2 puff Inhalation BID  . pantoprazole (PROTONIX) IV  40 mg Intravenous BID  . sodium chloride  3 mL Intravenous Q12H   Continuous Infusions: . sodium chloride 75 mL/hr at 08/08/14 0538    Active Problems:   GI bleed   Acute blood loss anemia    Time spent: 15 min    [facility] Pager [phone]. If 7PM-7AM, please contact night-coverage at [email or web address], password [facility] 08/08/2014, 3:57 PM  LOS: 1 day      Addendum: RN reported unable to draw blood for repeat H&H and INR around 5 30, requested for an arterial stick . RN reported unable to draw blood via arterial stick. She has 3 dark bloody stools today in the afternoon. New IV team coming at 7 to attempt draw blood for H&h and INR. If unable to draw blood please call PCCM for central access. Ordering another unit of prbc tonight.  Hosie Poisson, MD [phone].

## 2014-08-09 ENCOUNTER — Encounter (HOSPITAL_COMMUNITY)
Admission: EM | Disposition: A | Discharge status: Home Health | Payer: Medicare Other | Source: Home / Self Care | Attending: Internal Medicine

## 2014-08-09 DIAGNOSIS — K264 Chronic or unspecified duodenal ulcer with hemorrhage: Secondary | ICD-10-CM

## 2014-08-09 HISTORY — PX: ESOPHAGOGASTRODUODENOSCOPY: SHX5428

## 2014-08-09 LAB — PROTIME-INR
INR: 1.44 (ref 0.00–1.49)
PROTHROMBIN TIME: 17.7 s — AB (ref 11.6–15.2)

## 2014-08-09 LAB — HEMOGLOBIN AND HEMATOCRIT, BLOOD
HCT: 31.6 % — ABNORMAL LOW (ref 36.0–46.0)
HEMATOCRIT: 29.5 % — AB (ref 36.0–46.0)
Hemoglobin: 9.6 g/dL — ABNORMAL LOW (ref 12.0–15.0)
Hemoglobin: 10.1 g/dL — ABNORMAL LOW (ref 12.0–15.0)

## 2014-08-09 SURGERY — EGD (ESOPHAGOGASTRODUODENOSCOPY)
Anesthesia: Moderate Sedation

## 2014-08-09 MED ORDER — BUTAMBEN-TETRACAINE-BENZOCAINE 2-2-14 % EX AERO
INHALATION_SPRAY | CUTANEOUS | Status: DC | PRN
  Administered 2014-08-09: 2 via TOPICAL

## 2014-08-09 MED ORDER — FENTANYL CITRATE 0.05 MG/ML IJ SOLN
INTRAMUSCULAR | Status: AC
  Filled 2014-08-09: qty 2

## 2014-08-09 MED ORDER — MIDAZOLAM HCL 10 MG/2ML IJ SOLN
INTRAMUSCULAR | Status: DC | PRN
  Administered 2014-08-09 (×3): 2 mg via INTRAVENOUS

## 2014-08-09 MED ORDER — SODIUM CHLORIDE 0.9 % IV SOLN: INTRAVENOUS | Status: DC

## 2014-08-09 MED ORDER — CHLORHEXIDINE GLUCONATE 0.12 % MT SOLN
15.0000 mL | Freq: Two times a day (BID) | OROMUCOSAL | Status: DC
  Administered 2014-08-09 – 2014-08-13 (×8): 15 mL via OROMUCOSAL
  Filled 2014-08-09 (×9): qty 15

## 2014-08-09 MED ORDER — FENTANYL CITRATE 0.05 MG/ML IJ SOLN
INTRAMUSCULAR | Status: DC | PRN
  Administered 2014-08-09 (×3): 25 ug via INTRAVENOUS

## 2014-08-09 MED ORDER — PEG-KCL-NACL-NASULF-NA ASC-C 100 G PO SOLR
0.5000 | Freq: Once | ORAL | Status: AC
  Administered 2014-08-10: 100 g via ORAL
  Filled 2014-08-09: qty 1

## 2014-08-09 MED ORDER — PEG-KCL-NACL-NASULF-NA ASC-C 100 G PO SOLR
0.5000 | Freq: Once | ORAL | Status: DC
  Filled 2014-08-09: qty 1

## 2014-08-09 MED ORDER — CETYLPYRIDINIUM CHLORIDE 0.05 % MT LIQD
7.0000 mL | Freq: Two times a day (BID) | OROMUCOSAL | Status: DC
  Administered 2014-08-09 – 2014-08-13 (×6): 7 mL via OROMUCOSAL

## 2014-08-09 MED ORDER — MIDAZOLAM HCL 10 MG/2ML IJ SOLN
INTRAMUSCULAR | Status: AC
  Filled 2014-08-09: qty 2

## 2014-08-09 NOTE — Progress Notes (Signed)
[facility] PROGRESS NOTE  [name] GQQ:[id number] DOB: [birth year] DOA: 08/07/2014 PCP: Tamsen Roers, MD  Assessment/Plan: BRBPR and hematemesis: unclear etiology, possibly mucosal bleed from supratherapeutic INR. Her EGD on last admission showed esophageal stricture. Last colonoscopy was in 2011.  Started her on IV protonix, NPO .  Gastroenterology consulted and recommendations given.  Holding coumadin. Coumadin reversal with vitamin K AND FFP on admission.  On 12/25 pt had 3 more episodes of bloody bowel movements with marron colored blood. 2 more units of prbc and 2 units of FFP ordered and she was transferred to step down. She underwent enteroscopy and EGD on 12/26 , did not reveal any abnormalities.   rpeat INR is 1.4 and repeat hemoglobin is 9.6.  H&H every 8 hours.     Hypothyroidism: Resume synthroid.    Hypertension: BP well controlled.   CAD/ atrial fibrillation: She denies any chest pain or sob. Resume medications orther than aspirin and coumadin. Keep hemoglobin greater than 9.  Rate controlled atrial fibrillation.    Right knee pain and swelling: -x ray of the knee showed severe osteoarthritis with mod sized effusion. Will need arthrocentesis as she has so much pain on bearing weight on the right knee.    Code Status: DNR Family Communication: NONE at bedside today Disposition Plan: pending.    Consultants:  gastroenterology  Procedures: EGD, enteroscopy.  Antibiotics:  none  HPI/Subjective: Feeling much better, no nausea, or hematemesis. No bloody diarrhea.   Objective: Filed Vitals:   08/09/14 1200  BP: 131/37  Pulse: 59  Temp: 97.7 F (36.5 C)  Resp: 17    Intake/Output Summary (Last 24 hours) at 08/09/14 1436 Last data filed at 08/09/14 0947  Gross per 24 hour  Intake 2979.5 ml  Output   1750 ml  Net 1229.5 ml   Filed Weights   08/08/14 2000 08/09/14 0100 08/09/14 0400  Weight: 85.9 kg (189 lb 6 oz) 85.6 kg (188  lb 11.4 oz) 85.6 kg (188 lb 11.4 oz)    Exam:   General:  Alert afebrile comfortbale  Cardiovascular: s1s2  Respiratory: ctab  Abdomen: soft NT ND BS+  Musculoskeletal: Trace pedal edema. Right knee swelling.   Data Reviewed: Basic Metabolic Panel:  Recent Labs Lab 08/07/14 0933  NA 140  K 3.8  CL 108  CO2 24  GLUCOSE 92  BUN 99*  CREATININE 2.86*  CALCIUM 8.6   Liver Function Tests:  Recent Labs Lab 08/07/14 0933  AST 53*  ALT 24  ALKPHOS 82  BILITOT 2.0*  PROT 6.2  ALBUMIN 2.4*    Recent Labs Lab 08/07/14 0933  LIPASE 40   No results for input(s): AMMONIA in the last 168 hours. CBC:  Recent Labs Lab 08/07/14 0933 08/08/14 0800 08/08/14 2115 08/09/14 0726  WBC 6.4  --   --   --   NEUTROABS 5.1  --   --   --   HGB 8.3* 8.1* 7.1* 9.6*  HCT 25.4* 24.4* 21.6* 29.5*  MCV 91.0  --   --   --   PLT 202  --   --   --    Cardiac Enzymes: No results for input(s): CKTOTAL, CKMB, CKMBINDEX, TROPONINI in the last 168 hours. BNP (last 3 results)  Recent Labs  10/28/13 1450 05/07/14 1659 05/10/14 0508  PROBNP 1368.0* 11688.0* 12366.0*   CBG: No results for input(s): GLUCAP in the last 168 hours.  Recent Results (from the past 240 hour(s))  MRSA PCR Screening  Status: None   Collection Time: 08/07/14 12:43 PM  Result Value Ref Range Status   MRSA by PCR NEGATIVE NEGATIVE Final    Comment:        The GeneXpert MRSA Assay (FDA approved for NASAL specimens only), is one component of a comprehensive MRSA colonization surveillance program. It is not intended to diagnose MRSA infection nor to guide or monitor treatment for MRSA infections.      Studies: Dg Knee Complete 4 Views Right  08/08/2014   CLINICAL DATA:  [place] [address] 2 weeks go. Patient complains of right anterior knee pain that radiates to the lateral aspect.  EXAM: RIGHT KNEE - COMPLETE 4+ VIEW  COMPARISON:  None.  FINDINGS: There is diffuse joint space loss throughout the right  knee. Evidence for a moderate-sized joint effusion. There is osteophytosis in the knee compartments and evidence for chondrocalcinosis. Negative for an acute fracture or dislocation. There are vascular calcifications.  IMPRESSION: Moderate to severe osteoarthritis in the right knee with a joint effusion. No acute bone abnormality.   Electronically Signed   By: Markus Daft M.D.   On: 08/08/2014 10:06    Scheduled Meds: . sodium chloride   Intravenous Once  . sodium chloride   Intravenous Once  . antiseptic oral rinse  7 mL Mouth Rinse q12n4p  . chlorhexidine  15 mL Mouth Rinse BID  . furosemide  80 mg Oral BID  . isosorbide mononitrate  120 mg Oral Daily  . levothyroxine  100 mcg Oral QAC breakfast  . metolazone  2.5 mg Oral Once per day on Mon Thu  . mometasone-formoterol  2 puff Inhalation BID  . pantoprazole (PROTONIX) IV  40 mg Intravenous BID  . [START ON 08/10/2014] peg 3350 powder  0.5 kit Oral Once  . sodium chloride  3 mL Intravenous Q12H   Continuous Infusions: . sodium chloride      Active Problems:   GI bleed   Acute blood loss anemia   Gastrointestinal hemorrhage with melena    Time spent: 25 min    [facility] Pager [phone]. If 7PM-7AM, please contact night-coverage at [email or web address], password [facility] 08/09/2014, 2:36 PM  LOS: 2 days

## 2014-08-09 NOTE — Progress Notes (Signed)
PT Cancellation Note  Patient Details Name: [name] MRN: [id number] DOB: May 02, 1935   Cancelled Treatment:     RN advises, pt undergoing EGD this am and likely to be "too sleepy" following procedure - requests PT return in am.  Will follow.   Jaaron Oleson 08/09/2014, 11:49 AM

## 2014-08-09 NOTE — Progress Notes (Signed)
EGD/enteroscopy did not demonstrate any abnormalities.  There was no fresh or old blood.  Plan capsule endoscopy looking for a more distal SB bleeding source.  Hold anticoagulation Rx.

## 2014-08-09 NOTE — Progress Notes (Signed)
     Progress Note   Subjective  *Patient developed acute bleeding last evening with concurrent drop in hemoglobin.**She was transfused and given FFP as well.  Today's INR is pending.   Objective  Vital signs in last 24 hours: Temp:  [97.1 F (36.2 C)-97.8 F (36.6 C)] 97.6 F (36.4 C) (12/26 0800) Pulse Rate:  [59-65] 65 (12/26 0600) Resp:  [11-23] 15 (12/26 0600) BP: (131-158)/(36-56) 140/36 mmHg (12/26 0510) SpO2:  [95 %-100 %] 100 % (12/26 0837) Weight:  [188 lb 11.4 oz (85.6 kg)-189 lb 6 oz (85.9 kg)] 188 lb 11.4 oz (85.6 kg) (12/26 0400) Last BM Date:  (pt cannot recall) General:   Alert,  Well-developed,   in NAD Heart:  Regular rate and rhythm; no murmurs Abdomen:  Soft, nontender and nondistended. Normal bowel sounds, without guarding, and without rebound.   Extremities:  Without edema. Neurologic:  Alert and  oriented x4;  grossly normal neurologically. Psych:  Alert and cooperative. Normal mood and affect.  Intake/Output from previous day: 12/25 0701 - 12/26 0700 In: 2889.5 [P.O.:60; I.V.:1452.5; Blood:1377] Out: 2550 [Urine:2200; Blood:350] Intake/Output this shift: Total I/O In: 150 [I.V.:150] Out: 400 [Urine:400]  Lab Results:  Recent Labs  08/07/14 0933 08/08/14 0800 08/08/14 2115 08/09/14 0726  WBC 6.4  --   --   --   HGB 8.3* 8.1* 7.1* 9.6*  HCT 25.4* 24.4* 21.6* 29.5*  PLT 202  --   --   --    BMET  Recent Labs  08/07/14 0933  NA 140  K 3.8  CL 108  CO2 24  GLUCOSE 92  BUN 99*  CREATININE 2.86*  CALCIUM 8.6   LFT  Recent Labs  08/07/14 0933  PROT 6.2  ALBUMIN 2.4*  AST 53*  ALT 24  ALKPHOS 82  BILITOT 2.0*   PT/INR  Recent Labs  08/07/14 0933 08/08/14 2115  LABPROT 37.7* 25.2*  INR 3.80* 2.27*   Hepatitis Panel No results for input(s): HEPBSAG, HCVAB, HEPAIGM, HEPBIGM in the last 72 hours.  Studies/Results: Dg Knee Complete 4 Views Right  08/08/2014   CLINICAL DATA:  [address] [address] 2 weeks go. Patient complains of  right anterior knee pain that radiates to the lateral aspect.  EXAM: RIGHT KNEE - COMPLETE 4+ VIEW  COMPARISON:  None.  FINDINGS: There is diffuse joint space loss throughout the right knee. Evidence for a moderate-sized joint effusion. There is osteophytosis in the knee compartments and evidence for chondrocalcinosis. Negative for an acute fracture or dislocation. There are vascular calcifications.  IMPRESSION: Moderate to severe osteoarthritis in the right knee with a joint effusion. No acute bone abnormality.   Electronically Signed   By: Markus Daft M.D.   On: 08/08/2014 10:06      Assessment & Plan  *Recurrent acute GI bleed.  BUN is elevated compared to baseline again suggesting a proximal GI bleed.  Note she did have hematemesis at her last admission.  Plan upper endoscopy today.  If negative will proceed with enteroscopy ** Active Problems:   GI bleed   Acute blood loss anemia   Gastrointestinal hemorrhage with melena     LOS: 2 days   Erskine Emery  08/09/2014, 9:53 AM

## 2014-08-10 LAB — PREPARE FRESH FROZEN PLASMA
UNIT DIVISION: 0
Unit division: 0
Unit division: 0
Unit division: 0

## 2014-08-10 LAB — TYPE AND SCREEN
ABO/RH(D): O POS
ANTIBODY SCREEN: POSITIVE
DAT, IgG: POSITIVE
DONOR AG TYPE: NEGATIVE
Donor AG Type: NEGATIVE
Donor AG Type: NEGATIVE
Donor AG Type: NEGATIVE
PT AG Type: NEGATIVE
UNIT DIVISION: 0
UNIT DIVISION: 0
UNIT DIVISION: 0
Unit division: 0

## 2014-08-10 LAB — BASIC METABOLIC PANEL
Anion gap: 8 (ref 5–15)
BUN: 81 mg/dL — AB (ref 6–23)
CALCIUM: 8.4 mg/dL (ref 8.4–10.5)
CO2: 24 mmol/L (ref 19–32)
Chloride: 107 mEq/L (ref 96–112)
Creatinine, Ser: 2.45 mg/dL — ABNORMAL HIGH (ref 0.50–1.10)
GFR calc Af Amer: 21 mL/min — ABNORMAL LOW (ref >90)
GFR calc non Af Amer: 18 mL/min — ABNORMAL LOW (ref >90)
GLUCOSE: 88 mg/dL (ref 70–99)
POTASSIUM: 2.7 mmol/L — AB (ref 3.5–5.1)
SODIUM: 139 mmol/L (ref 135–145)

## 2014-08-10 LAB — CBC
HEMATOCRIT: 27.9 % — AB (ref 36.0–46.0)
Hemoglobin: 9.2 g/dL — ABNORMAL LOW (ref 12.0–15.0)
MCH: 29.5 pg (ref 26.0–34.0)
MCHC: 33 g/dL (ref 30.0–36.0)
MCV: 89.4 fL (ref 78.0–100.0)
Platelets: 123 10*3/uL — ABNORMAL LOW (ref 150–400)
RBC: 3.12 MIL/uL — AB (ref 3.87–5.11)
RDW: 17.6 % — ABNORMAL HIGH (ref 11.5–15.5)
WBC: 4.7 10*3/uL (ref 4.0–10.5)

## 2014-08-10 LAB — HEMOGLOBIN AND HEMATOCRIT, BLOOD
HCT: 28.3 % — ABNORMAL LOW (ref 36.0–46.0)
HEMATOCRIT: 28.4 % — AB (ref 36.0–46.0)
Hemoglobin: 9.2 g/dL — ABNORMAL LOW (ref 12.0–15.0)
Hemoglobin: 9.2 g/dL — ABNORMAL LOW (ref 12.0–15.0)

## 2014-08-10 LAB — PROTIME-INR
INR: 1.72 — ABNORMAL HIGH (ref 0.00–1.49)
Prothrombin Time: 20.3 seconds — ABNORMAL HIGH (ref 11.6–15.2)

## 2014-08-10 LAB — MAGNESIUM: Magnesium: 1.4 mg/dL — ABNORMAL LOW (ref 1.5–2.5)

## 2014-08-10 LAB — POTASSIUM: POTASSIUM: 3.3 mmol/L — AB (ref 3.5–5.1)

## 2014-08-10 MED ORDER — MAGNESIUM SULFATE 2 GM/50ML IV SOLN
2.0000 g | Freq: Once | INTRAVENOUS | Status: AC
  Administered 2014-08-10: 2 g via INTRAVENOUS
  Filled 2014-08-10: qty 50

## 2014-08-10 MED ORDER — POTASSIUM CHLORIDE CRYS ER 20 MEQ PO TBCR
40.0000 meq | EXTENDED_RELEASE_TABLET | Freq: Two times a day (BID) | ORAL | Status: AC
  Administered 2014-08-10 (×2): 40 meq via ORAL
  Filled 2014-08-10 (×2): qty 2

## 2014-08-10 MED ORDER — POTASSIUM CHLORIDE 10 MEQ/100ML IV SOLN
10.0000 meq | INTRAVENOUS | Status: AC
  Administered 2014-08-10 (×4): 10 meq via INTRAVENOUS
  Filled 2014-08-10: qty 100

## 2014-08-10 NOTE — Progress Notes (Signed)
CRITICAL VALUE ALERT  Critical value received:  K+ 2.7   Date of notification:  08/10/14  Time of notification:  0230  Critical value read back:Yes.    Nurse who received alert:  Loyal Gambler, RN   MD notified (1st page):  Rogue Bussing  Time of first page:  0230  MD notified (2nd page):  Time of second page:  Responding MD:  Rogue Bussing   Time MD responded:  [phone]

## 2014-08-10 NOTE — Op Note (Signed)
[facility] [place], [id number]   ENTEROSCOPY PROCEDURE REPORT     EXAM DATE: 08/09/2014  PATIENT NAME:      [name], [name]           MR #:      [id number]  BIRTHDATE:       September 07, 1934      VISIT #:     [id number] ATTENDING:     Inda Castle, MD     STATUS:     inpatient ASSISTANT:      Sharon Mt and Jerel Shepherd MD: ASA CLASS:        Class III  INDICATIONS:  The patient is a 79 yr old female here for an enteroscopy procedure due to melenic bleeding. PROCEDURE PERFORMED:     Diagnostic small bowel enteroscopy  MEDICATIONS:     Versed 6 mg IV and Fentanyl 50 mcg IV  CONSENT: The patient understands the risks and benefits of the procedure and understands that these risks include, but are not limited to: sedation, allergic reaction, infection, perforation and/or bleeding. Alternative means of evaluation and treatment include, among others: physical exam, x-rays, and/or surgical intervention. The patient elects to proceed with this endoscopic procedure.  DESCRIPTION OF PROCEDURE: During intra-op preparation period all mechanical & medical equipment was checked for proper function. Hand hygiene and appropriate measures for infection prevention was taken. After the risks, benefits and alternatives of the procedure were thoroughly explained, Informed consent was verified, confirmed and timeout was successfully executed by the treatment team. The Pentax VSB-2900 endoscope was introduced through the mouth and advanced to the proximal jejunum (210cm). The prep was   . The instrument was then slowly withdrawn while examining the mucosa circumferentially. The scope was then completely withdrawn from the patient and the procedure terminated. The pulse, BP, and O2 saturation were monitored and documented by the physician and the nursing staff throughout the entire procedure.  The patient was cared for as planned according to  standard protocol, then discharged to recovery in stable condition and with appropriate post procedure care.    JEJUNUM: The exam showed no abnormalities in the jejunum.  STOMACH: The stomach was entered and closely examined.  the antrum, angularis, and lesser curvature were well visualized, including a retroflexed view of the cardia and fundus.  The stomach wall was normally distensable.  The scope passed easily through the pylorus into the duodenum.  ESOPHAGUS: The esophagus and gastroesophageal junction were completely normal in appearance.  DUODENUM: The duodenal bulb was normal in appearance, as was the postbulbar duodenum.    ADVERSE EVENTS:      There were no immediate complications.  IMPRESSIONS:     1.  The exam showed no abnormalities in the jejunum  2.  The stomach was well visualized and normal in appearance 3.  Normal appearing esophagus and GE junction 4.  Normal appearing duodenum  There was no fresh or old blood in the upper GI tract  RECOMMENDATIONS:     capsule endoscopy RECALL:  _____________________________ Inda Castle, MD eSigned:  Inda Castle, MD 08/10/2014 12:00 PM   cc:     PATIENT NAME:  Iolanda, Folson MR#: [id number]

## 2014-08-10 NOTE — Progress Notes (Signed)
PT Cancellation Note  Patient Details Name: JILLEEN ESSNER MRN: [id number] DOB: 04-06-1935   Cancelled Treatment:     Dr Karleen Hampshire in pt room on arrival - requests PT deferred pending ortho consult regarding knee effusion.  Will follow.   Fortunata Betty 08/10/2014, 12:38 PM

## 2014-08-10 NOTE — Progress Notes (Signed)
[facility] PROGRESS NOTE  [name] OFB:[id number] DOB: [birth year] DOA: 08/07/2014 PCP: Tamsen Roers, MD  Assessment/Plan: BRBPR and hematemesis: unclear etiology, possibly mucosal bleed from supratherapeutic INR. Her EGD on last admission showed esophageal stricture. Last colonoscopy was in 2011.  Started her on IV protonix, NPO .  Gastroenterology consulted and recommendations given.  Holding coumadin. Coumadin reversal with vitamin K AND FFP on admission.  On 12/25 pt had 3 more episodes of bloody bowel movements with marron colored blood. 2 more units of prbc and 2 units of FFP ordered and she was transferred to step down. She underwent enteroscopy and EGD on 12/26 , did not reveal any abnormalities.   rpeat INR is 1.7 and repeat hemoglobin is around 9 H&H every 8 hours.  Capsule endoscopy,.   Hypothyroidism: Resume synthroid.    Hypertension: BP well controlled.   CAD/ atrial fibrillation: She denies any chest pain or sob. Resume medications orther than aspirin and coumadin. Keep hemoglobin greater than 9.  Rate controlled atrial fibrillation.    Right knee pain and swelling: -x ray of the knee showed severe osteoarthritis with mod sized effusion. Will need arthrocentesis as she has so much pain on bearing weight on the right knee.    Code Status: DNR Family Communication: NONE at bedside today Disposition Plan: pending.    Consultants:  gastroenterology  Procedures: EGD, enteroscopy.  Antibiotics:  none  HPI/Subjective: Feeling much better, no nausea, or hematemesis. 2 bowel movements with streaks of bleed.   Objective: Filed Vitals:   08/10/14 1135  BP: 141/57  Pulse: 65  Temp: 97.7 F (36.5 C)  Resp: 16    Intake/Output Summary (Last 24 hours) at 08/10/14 1748 Last data filed at 08/10/14 1458  Gross per 24 hour  Intake    953 ml  Output    175 ml  Net    778 ml   Filed Weights   08/09/14 0400 08/10/14 0400 08/10/14 0500   Weight: 85.6 kg (188 lb 11.4 oz) 85 kg (187 lb 6.3 oz) 86.5 kg (190 lb 11.2 oz)    Exam:   General:  Alert afebrile comfortbale  Cardiovascular: s1s2  Respiratory: ctab  Abdomen: soft NT ND BS+  Musculoskeletal: Trace pedal edema. Right knee swelling.   Data Reviewed: Basic Metabolic Panel:  Recent Labs Lab 08/07/14 0933 08/10/14 0147 08/10/14 1240  NA 140 139  --   K 3.8 2.7* 3.3*  CL 108 107  --   CO2 24 24  --   GLUCOSE 92 88  --   BUN 99* 81*  --   CREATININE 2.86* 2.45*  --   CALCIUM 8.6 8.4  --   MG  --   --  1.4*   Liver Function Tests:  Recent Labs Lab 08/07/14 0933  AST 53*  ALT 24  ALKPHOS 82  BILITOT 2.0*  PROT 6.2  ALBUMIN 2.4*    Recent Labs Lab 08/07/14 0933  LIPASE 40   No results for input(s): AMMONIA in the last 168 hours. CBC:  Recent Labs Lab 08/07/14 0933  08/08/14 2115 08/09/14 0726 08/09/14 1803 08/10/14 0147 08/10/14 0918  WBC 6.4  --   --   --   --  4.7  --   NEUTROABS 5.1  --   --   --   --   --   --   HGB 8.3*  < > 7.1* 9.6* 10.1* 9.2* 9.2*  HCT 25.4*  < > 21.6* 29.5* 31.6* 27.9* 28.3*  MCV 91.0  --   --   --   --  89.4  --   PLT 202  --   --   --   --  123*  --   < > = values in this interval not displayed. Cardiac Enzymes: No results for input(s): CKTOTAL, CKMB, CKMBINDEX, TROPONINI in the last 168 hours. BNP (last 3 results)  Recent Labs  10/28/13 1450 05/07/14 1659 05/10/14 0508  PROBNP 1368.0* 11688.0* 12366.0*   CBG: No results for input(s): GLUCAP in the last 168 hours.  Recent Results (from the past 240 hour(s))  MRSA PCR Screening     Status: None   Collection Time: 08/07/14 12:43 PM  Result Value Ref Range Status   MRSA by PCR NEGATIVE NEGATIVE Final    Comment:        The GeneXpert MRSA Assay (FDA approved for NASAL specimens only), is one component of a comprehensive MRSA colonization surveillance program. It is not intended to diagnose MRSA infection nor to guide or monitor  treatment for MRSA infections.      Studies: No results found.  Scheduled Meds: . sodium chloride   Intravenous Once  . sodium chloride   Intravenous Once  . antiseptic oral rinse  7 mL Mouth Rinse q12n4p  . chlorhexidine  15 mL Mouth Rinse BID  . furosemide  80 mg Oral BID  . isosorbide mononitrate  120 mg Oral Daily  . levothyroxine  100 mcg Oral QAC breakfast  . metolazone  2.5 mg Oral Once per day on Mon Thu  . mometasone-formoterol  2 puff Inhalation BID  . pantoprazole (PROTONIX) IV  40 mg Intravenous BID  . peg 3350 powder  0.5 kit Oral Once  . potassium chloride  40 mEq Oral BID  . sodium chloride  3 mL Intravenous Q12H   Continuous Infusions:    Active Problems:   GI bleed   Acute blood loss anemia   Gastrointestinal hemorrhage with melena    Time spent: 25 min    [facility] Pager [phone]. If 7PM-7AM, please contact night-coverage at [email or web address], password [facility] 08/10/2014, 5:48 PM  LOS: 3 days

## 2014-08-10 NOTE — Progress Notes (Signed)
     [facility] Progress Note  Subjective:  Had two bloody BM's per nursing staff.  Patient has no complaints except that she is hungry.  Objective:  Vital signs in last 24 hours: Temp:  [97.6 F (36.4 C)-98 F (36.7 C)] 97.6 F (36.4 C) (12/27 0800) Pulse Rate:  [36-67] 60 (12/27 0400) Resp:  [11-29] 19 (12/27 0400) BP: (130-176)/(37-111) 132/50 mmHg (12/27 0400) SpO2:  [100 %] 100 % (12/27 0912) Weight:  [187 lb 6.3 oz (85 kg)-190 lb 11.2 oz (86.5 kg)] 190 lb 11.2 oz (86.5 kg) (12/27 0500) Last BM Date: 08/09/14 General:  Alert, elderly, in NAD Heart:  Regular rate and rhythm; 2/6 SEM noted Pulm:  CTAB.  No W/R/R. Abdomen:  Soft, non-distended.  BS present.  Non-tender. Extremities:  Without edema. Neurologic:  Alert and  oriented x4;  grossly normal neurologically.  Intake/Output from previous day: 12/26 0701 - 12/27 0700 In: 1050 [P.O.:500; I.V.:150; IV Piggyback:400] Out: 950 [Urine:950]  Lab Results:  Recent Labs  08/09/14 0726 08/09/14 1803 08/10/14 0147  WBC  --   --  4.7  HGB 9.6* 10.1* 9.2*  HCT 29.5* 31.6* 27.9*  PLT  --   --  123*   BMET  Recent Labs  08/10/14 0147  NA 139  K 2.7*  CL 107  CO2 24  GLUCOSE 88  BUN 81*  CREATININE 2.45*  CALCIUM 8.4   PT/INR  Recent Labs  08/09/14 1003 08/10/14 0147  LABPROT 17.7* 20.3*  INR 1.44 1.72*   Dg Knee Complete 4 Views Right  08/08/2014   CLINICAL DATA:  [place] [address] 2 weeks go. Patient complains of right anterior knee pain that radiates to the lateral aspect.  EXAM: RIGHT KNEE - COMPLETE 4+ VIEW  COMPARISON:  None.  FINDINGS: There is diffuse joint space loss throughout the right knee. Evidence for a moderate-sized joint effusion. There is osteophytosis in the knee compartments and evidence for chondrocalcinosis. Negative for an acute fracture or dislocation. There are vascular calcifications.  IMPRESSION: Moderate to severe osteoarthritis in the right knee with a joint effusion. No acute  bone abnormality.   Electronically Signed   By: Markus Daft M.D.   On: 08/08/2014 10:06    Assessment / Plan: -Gastrointestinal bleeding in the setting of coumadin coagulopathy:  EGD/enteroscopy negative on 12/26. -Supratherapeutic INR: 3.8 on admission, s/p Vitamin K and FFP with INR 1.72 this AM. -Acute blood loss anemia:  Hgb stable at 9.2 grams. -Multiple medical problems  *Continue to monitor Hgb and transfuse further if needed. *Wireless capsule study tomorrow, 12/28.   LOS: 3 days   ZEHR, JESSICA D.  08/10/2014, 9:38 AM  Pager number [phone]  No further overt GI bleeding.  INR now normalized.  So far no source for GI bleeding has been identified.  In view of recurrent bleeding in the face of anticoagulation therapy will proceed with capsule endoscopy as above.

## 2014-08-11 ENCOUNTER — Encounter: Payer: Medicare Other | Admitting: Cardiovascular Disease

## 2014-08-11 ENCOUNTER — Encounter (HOSPITAL_COMMUNITY): Payer: Self-pay | Admitting: Gastroenterology

## 2014-08-11 ENCOUNTER — Encounter (HOSPITAL_COMMUNITY)
Admission: EM | Disposition: A | Discharge status: Home Health | Payer: Medicare Other | Source: Home / Self Care | Attending: Internal Medicine

## 2014-08-11 DIAGNOSIS — R52 Pain, unspecified: Secondary | ICD-10-CM | POA: Insufficient documentation

## 2014-08-11 HISTORY — PX: GIVENS CAPSULE STUDY: SHX5432

## 2014-08-11 LAB — BASIC METABOLIC PANEL
ANION GAP: 11 (ref 5–15)
BUN: 73 mg/dL — ABNORMAL HIGH (ref 6–23)
CO2: 21 mmol/L (ref 19–32)
CREATININE: 2.38 mg/dL — AB (ref 0.50–1.10)
Calcium: 8.7 mg/dL (ref 8.4–10.5)
Chloride: 108 mEq/L (ref 96–112)
GFR calc Af Amer: 22 mL/min — ABNORMAL LOW (ref >90)
GFR calc non Af Amer: 19 mL/min — ABNORMAL LOW (ref >90)
Glucose, Bld: 96 mg/dL (ref 70–99)
Potassium: 3.9 mmol/L (ref 3.5–5.1)
SODIUM: 140 mmol/L (ref 135–145)

## 2014-08-11 LAB — HEMOGLOBIN AND HEMATOCRIT, BLOOD
HCT: 30.6 % — ABNORMAL LOW (ref 36.0–46.0)
HCT: 29.9 % — ABNORMAL LOW (ref 36.0–46.0)
HEMATOCRIT: 29.4 % — AB (ref 36.0–46.0)
HEMOGLOBIN: 9.9 g/dL — AB (ref 12.0–15.0)
HEMOGLOBIN: 9.6 g/dL — AB (ref 12.0–15.0)
Hemoglobin: 9.5 g/dL — ABNORMAL LOW (ref 12.0–15.0)

## 2014-08-11 LAB — PROTIME-INR
INR: 2.12 — ABNORMAL HIGH (ref 0.00–1.49)
Prothrombin Time: 24 seconds — ABNORMAL HIGH (ref 11.6–15.2)

## 2014-08-11 SURGERY — IMAGING PROCEDURE, GI TRACT, INTRALUMINAL, VIA CAPSULE
Anesthesia: LOCAL

## 2014-08-11 SURGICAL SUPPLY — 1 items: TOWEL COTTON PACK 4EA (MISCELLANEOUS) ×4 IMPLANT

## 2014-08-11 NOTE — Progress Notes (Addendum)
PT Cancellation Note  Patient Details Name: ELEANOR GATLIFF MRN: [id number] DOB: [birth year]   Cancelled Treatment:    Reason Eval/Treat Not Completed: MD requested PT be held until ortho consulted regarding knee effusion. Spoke with RN who reports ortho has not been by yet. Will hold PT eval and check back on tomorrow. Thanks.    Weston Anna, MPT Pager: [phone]

## 2014-08-11 NOTE — Progress Notes (Signed)
    Progress Note   Subjective  feels okay. No further bleeding, stools normal today    Objective   Vital signs in last 24 hours: Temp:  [97.7 F (36.5 C)-98.5 F (36.9 C)] 98.5 F (36.9 C) (12/28 0503) Pulse Rate:  [60-65] 60 (12/28 0503) Resp:  [16-24] 20 (12/28 0503) BP: (123-154)/(43-64) 123/43 mmHg (12/28 0503) SpO2:  [97 %-100 %] 98 % (12/28 0814) Weight:  [184 lb 8.4 oz (83.7 kg)] 184 lb 8.4 oz (83.7 kg) (12/28 0503) Last BM Date: 08/11/14 General:    white female in NAD Heart:  Regular rate and rhythm Abdomen:  Soft, nontender and nondistended. Normal bowel sounds. Neurologic:  Alert and oriented,  grossly normal neurologically. Psych:  Cooperative. Normal mood and affect.    Lab Results:  Recent Labs  08/10/14 0147  08/10/14 1750 08/11/14 0136 08/11/14 0855  WBC 4.7  --   --   --   --   HGB 9.2*  < > 9.2* 9.9* 9.6*  HCT 27.9*  < > 28.4* 30.6* 29.9*  PLT 123*  --   --   --   --   < > = values in this interval not displayed. BMET  Recent Labs  08/10/14 0147 08/10/14 1240 08/11/14 0136  NA 139  --  140  K 2.7* 3.3* 3.9  CL 107  --  108  CO2 24  --  21  GLUCOSE 88  --  96  BUN 81*  --  73*  CREATININE 2.45*  --  2.38*  CALCIUM 8.4  --  8.7   PT/INR  Recent Labs  08/10/14 0147 08/11/14 0136  LABPROT 20.3* 24.0*  INR 1.72* 2.12*      Assessment / Plan:   53. 79 year old female with recent admission for signs of upper GI bleed on coumadin. EGD was unrevealing. Readmitted now with recurrent black stools / hematemesis on coumadin. Enteroscopy this admission was unremarkable. Source of bleeding still undetermined.  [facility] Endocapsule study in progress. Of note, she had a normal colonoscopy January 2011      LOS: 4 days   [name]  08/11/2014, 9:22 AM    GI Attending  I have also seen and assessed the patient and agree with the above note. Await capsule endoscopy. May need to have a repeat colonoscopy depending upon the capsule  results.  Gatha Mayer, MD, [facility] [phone] (pager) 08/11/2014 3:00 PM

## 2014-08-11 NOTE — Progress Notes (Signed)
[facility] PROGRESS NOTE  [name] ZOX:[id number] DOB: [birth year] DOA: 08/07/2014 PCP: Tamsen Roers, MD  Assessment/Plan: BRBPR and hematemesis: unclear etiology, possibly mucosal bleed from supratherapeutic INR. Her EGD on last admission showed esophageal stricture. Last colonoscopy was in 2011.  Started her on IV protonix, NPO .  Gastroenterology consulted and recommendations given.  Holding coumadin. Coumadin reversal with vitamin K AND FFP on admission.  On 12/25 pt had 3 more episodes of bloody bowel movements with marron colored blood. 2 more units of prbc and 2 units of FFP ordered and she was transferred to step down. She underwent enteroscopy and EGD on 12/26 , did not reveal any abnormalities.   rpeat INR oday is 2.1 and repeat hemoglobin is around 9 Capsule endoscopy, today.    Hypothyroidism: Resume synthroid.    Hypertension: BP well controlled.   CAD/ atrial fibrillation: She denies any chest pain or sob. Resume medications orther than aspirin and coumadin. Keep hemoglobin greater than 9.  Rate controlled atrial fibrillation.    Right knee pain and swelling: -x ray of the knee showed severe osteoarthritis with mod sized effusion. Will need arthrocentesis as she has so much pain on bearing weight on the right knee.    Code Status: DNR Family Communication: NONE at bedside today Disposition Plan: pending.    Consultants:  gastroenterology  Procedures: EGD, enteroscopy.  Antibiotics:  none  HPI/Subjective: Feeling much better, no nausea, or hematemesis. 2 bowel movements with streaks of bleed.   Objective: Filed Vitals:   08/11/14 1320  BP: 126/48  Pulse: 60  Temp: 98.3 F (36.8 C)  Resp: 16    Intake/Output Summary (Last 24 hours) at 08/11/14 1530 Last data filed at 08/11/14 1300  Gross per 24 hour  Intake    720 ml  Output      0 ml  Net    720 ml   Filed Weights   08/10/14 0400 08/10/14 0500 08/11/14 0503  Weight:  85 kg (187 lb 6.3 oz) 86.5 kg (190 lb 11.2 oz) 83.7 kg (184 lb 8.4 oz)    Exam:   General:  Alert afebrile comfortbale  Cardiovascular: s1s2  Respiratory: ctab  Abdomen: soft NT ND BS+  Musculoskeletal: Trace pedal edema. Right knee swelling.   Data Reviewed: Basic Metabolic Panel:  Recent Labs Lab 08/07/14 0933 08/10/14 0147 08/10/14 1240 08/11/14 0136  NA 140 139  --  140  K 3.8 2.7* 3.3* 3.9  CL 108 107  --  108  CO2 24 24  --  21  GLUCOSE 92 88  --  96  BUN 99* 81*  --  73*  CREATININE 2.86* 2.45*  --  2.38*  CALCIUM 8.6 8.4  --  8.7  MG  --   --  1.4*  --    Liver Function Tests:  Recent Labs Lab 08/07/14 0933  AST 53*  ALT 24  ALKPHOS 82  BILITOT 2.0*  PROT 6.2  ALBUMIN 2.4*    Recent Labs Lab 08/07/14 0933  LIPASE 40   No results for input(s): AMMONIA in the last 168 hours. CBC:  Recent Labs Lab 08/07/14 0933  08/10/14 0147 08/10/14 0918 08/10/14 1750 08/11/14 0136 08/11/14 0855  WBC 6.4  --  4.7  --   --   --   --   NEUTROABS 5.1  --   --   --   --   --   --   HGB 8.3*  < > 9.2* 9.2* 9.2* 9.9*  9.6*  HCT 25.4*  < > 27.9* 28.3* 28.4* 30.6* 29.9*  MCV 91.0  --  89.4  --   --   --   --   PLT 202  --  123*  --   --   --   --   < > = values in this interval not displayed. Cardiac Enzymes: No results for input(s): CKTOTAL, CKMB, CKMBINDEX, TROPONINI in the last 168 hours. BNP (last 3 results)  Recent Labs  10/28/13 1450 05/07/14 1659 05/10/14 0508  PROBNP 1368.0* 11688.0* 12366.0*   CBG: No results for input(s): GLUCAP in the last 168 hours.  Recent Results (from the past 240 hour(s))  MRSA PCR Screening     Status: None   Collection Time: 08/07/14 12:43 PM  Result Value Ref Range Status   MRSA by PCR NEGATIVE NEGATIVE Final    Comment:        The GeneXpert MRSA Assay (FDA approved for NASAL specimens only), is one component of a comprehensive MRSA colonization surveillance program. It is not intended to diagnose  MRSA infection nor to guide or monitor treatment for MRSA infections.      Studies: No results found.  Scheduled Meds: . sodium chloride   Intravenous Once  . sodium chloride   Intravenous Once  . antiseptic oral rinse  7 mL Mouth Rinse q12n4p  . chlorhexidine  15 mL Mouth Rinse BID  . furosemide  80 mg Oral BID  . isosorbide mononitrate  120 mg Oral Daily  . levothyroxine  100 mcg Oral QAC breakfast  . metolazone  2.5 mg Oral Once per day on Mon Thu  . mometasone-formoterol  2 puff Inhalation BID  . pantoprazole (PROTONIX) IV  40 mg Intravenous BID  . sodium chloride  3 mL Intravenous Q12H   Continuous Infusions:    Active Problems:   GI bleed   Acute blood loss anemia   Gastrointestinal hemorrhage with melena    Time spent: 25 min    [facility] Pager [phone]. If 7PM-7AM, please contact night-coverage at [email or web address], password [facility] 08/11/2014, 3:30 PM  LOS: 4 days

## 2014-08-11 NOTE — Consult Note (Addendum)
ORTHOPAEDIC CONSULTATION  REQUESTING PHYSICIAN: Hosie Poisson, MD  Chief Complaint: right knee pain  HPI: [name] is a 79 y.o. female who complains chronic R knee pain. She was admitted with a GI bleed and R knee pain limits her mobility. She has been treated in the past with cortisone injections several years ago. She denies fevers or systemic symptoms  Past Medical History  Diagnosis Date  . Cellulitis and abscess of unspecified site     a. h/o MSRA cellulitis of abdominal wall.  Marland Kitchen CAD (coronary artery disease)     a. DES to OM 01/2005. b. Rotational atherotomy/DES to prox LAD 02/2008. c. Cath 07/2013: stable, for med rx.  . Edema   . Warfarin anticoagulation     a. Followed by PCP.  Marland Kitchen GI bleed     a. EGD 01/2007: antral ulcers related to NSAID/aspirin use.  . Peripheral vascular disease   . Hyperthyroidism     a. Hx of intolerance to PTU.  Marland Kitchen Blood transfusion years ago    "heart problems" (09/26/2013)  . H/O hiatal hernia   . Umbilical hernia     unrepaired (11/24/11)  . Valvular heart disease     Echo 11/2011: mild-mod MR.  Marland Kitchen Hypertension     takes Amlodipine,Hydralazine,and Imdur daily  . Hyperlipidemia     takes Lovastatin daily  . SSS (sick sinus syndrome)     a. H/o AV node ablation 2008 secondary to difficult to control afib/flutter, with subsequent pacer.  . Atrial fibrillation with rapid ventricular response     a. H/o amiodarone thyroiditis. b. s/p AV node ablation 2008 with implantation of PPM 01/2007, upgraded to CRT-P 06/2007.;takes Coumadin daily  . GERD (gastroesophageal reflux disease)     takes Protonix daily  . Chronic diastolic CHF (congestive heart failure)     takes Lasix daily and Spironolactone   . Pacemaker     Medtronic  . History of gout   . Hemorrhoids   . Depression with anxiety     takes Xanax daily  . Insomnia     takes Xanax if needed  . Heart murmur   . CKD (chronic kidney disease)     Cr ~2.3.  . Kidney stones   .  Myocardial infarction     "I've had 2-3; last one was in /1996" (09/26/2013)  . Migraines     "last one was a pretty good while ago" (09/26/2013)  . Arthritis     "all over" (09/26/2013)  . Chronic lower back pain     deteriorating disc  . Iron deficiency anemia     takes an Iron Pill daily  . Pneumonia    Past Surgical History  Procedure Laterality Date  . Esophagogastroduodenoscopy    . Tonsillectomy  ~ 1960  . Laminectomy and microdiscectomy lumbar spine  08/2003  . Cardioversion  10/2004; 02/2007    /E-chart  . Cholecystectomy    . Appendectomy  1954  . Vaginal hysterectomy  ?1981  . Av node ablation  02/2007    /E-chart  . Insert / replace / remove pacemaker  01/2007    initial placement  . Insert / replace / remove pacemaker  06/2007    upgraded/E-chart  . Av fistula placement, brachiocephalic Right 09/4233    "didn't work; my veins were too small"  . Joint replacement    . Total knee arthroplasty Left 01/2009  . Bladder and bowel  1970's    "tacked; damaged my  intestines & had to remove some; done @ [facility]  . Cataract extraction w/ intraocular lens  implant, bilateral Bilateral   . Bascilic vein transposition Right 06/21/2013    Procedure: BASCILIC VEIN TRANSPOSITION 1ST STAGE;  Surgeon: Nada Libman, MD;  Location: [facility] OR;  Service: Vascular;  Laterality: Right;  . Esophagogastroduodenoscopy N/A 07/19/2013    Procedure: ESOPHAGOGASTRODUODENOSCOPY (EGD);  Surgeon: Hart Carwin, MD;  Location: [facility] ENDOSCOPY;  Service: Endoscopy;  Laterality: N/A;  . Balloon dilation N/A 07/19/2013    Procedure: BALLOON DILATION;  Surgeon: Hart Carwin, MD;  Location: [facility] ENDOSCOPY;  Service: Endoscopy;  Laterality: N/A;  . Esophagogastroduodenoscopy N/A 07/20/2013    Procedure: ESOPHAGOGASTRODUODENOSCOPY (EGD);  Surgeon: Hart Carwin, MD;  Location: [facility] ENDOSCOPY;  Service: Endoscopy;  Laterality: N/A;  with removal of polyp in pylorus  . Bascilic vein transposition Right 09/20/2013     Procedure: 2ND STAGE RIGHT BASCILIC VEIN TRANSPOSITION; ULTRSOUND GUIDED;  Surgeon: Nada Libman, MD;  Location: [facility] OR;  Service: Vascular;  Laterality: Right;  . Cardiac catheterization  2006; 2008; 2009; 01/13/2011  . Coronary angioplasty with stent placement  ~ 2007; ?    "had a total of 2 stents put in" 92/07/2014)  . Back surgery    . Left heart catheterization with coronary angiogram N/A 07/15/2013    Procedure: LEFT HEART CATHETERIZATION WITH CORONARY ANGIOGRAM;  Surgeon: Micheline Chapman, MD;  Location: [facility] C[facility] LAB;  Service: Cardiovascular;  Laterality: N/A;  . Esophagogastroduodenoscopy N/A 07/25/2014    Procedure: ESOPHAGOGASTRODUODENOSCOPY (EGD);  Surgeon: Hilarie Fredrickson, MD;  Location: [facility] ENDOSCOPY;  Service: Endoscopy;  Laterality: N/A;  . Esophagogastroduodenoscopy N/A 08/09/2014    Procedure: ESOPHAGOGASTRODUODENOSCOPY (EGD);  Surgeon: Louis Meckel, MD;  Location: [facility] ENDOSCOPY;  Service: Endoscopy;  Laterality: N/A;  Possible enteroscopy   History   Social History  . Marital Status: Widowed    Spouse Name: N/A    Number of Children: N/A  . Years of Education: N/A   Social History Main Topics  . Smoking status: Former Smoker -- 1.00 packs/day for 50 years    Types: Cigarettes    Quit date: 07/13/1995  . Smokeless tobacco: Never Used     Comment: quit smoking around 1996  . Alcohol Use: No  . Drug Use: No  . Sexual Activity: No   Other Topics Concern  . None   Social History Narrative   Lives in [place].   Family History  Problem Relation Age of Onset  . Colon cancer Neg Hx   . Thyroid disease Neg Hx   . Stroke Brother   . Hypertension Brother   . Kidney disease Brother   . Kidney disease Brother   . Other Mother     Sepsis and perforated colon  . Kidney disease Mother   . Other Father     Motor vehicle accident  . Learning disabilities Son   . Kidney disease Maternal Aunt   . Kidney disease Maternal Grandmother    Allergies  Allergen Reactions    . Amiodarone Hives  . Propylthiouracil Rash   Prior to Admission medications   Medication Sig Start Date End Date Taking? Authorizing Provider  ALPRAZolam Prudy Feeler) 0.5 MG tablet Take 1 tablet (0.5 mg total) by mouth at bedtime as needed for anxiety or sleep. 07/30/14  Yes Monica Caro Laroche, DO  aspirin 81 MG tablet Take 81 mg by mouth daily.   Yes Historical Provider, MD  camphor-menthol Wynelle Fanny) lotion Apply topically as  needed for itching. 05/12/14  Yes Barton Dubois, MD  colchicine 0.6 MG tablet Take 0.6 mg by mouth 2 (two) times daily as needed (Gout).  05/20/14  Yes Historical Provider, MD  ergocalciferol (VITAMIN D2) 50000 UNITS capsule Take 50,000 Units by mouth every Saturday. Take on saturdays   Yes Historical Provider, MD  Fluticasone-Salmeterol (ADVAIR DISKUS) 100-50 MCG/DOSE AEPB Inhale 1 puff into the lungs 2 (two) times daily. 01/27/14  Yes Brand Males, MD  furosemide (LASIX) 80 MG tablet Take 1 tablet (80 mg total) by mouth 2 (two) times daily. 05/12/14  Yes Barton Dubois, MD  hydrALAZINE (APRESOLINE) 25 MG tablet TAKE 1 TABLET BY MOUTH 3 TIMES A DAY 07/19/14  Yes Josue Hector, MD  isosorbide mononitrate (IMDUR) 60 MG 24 hr tablet TAKE 2 TABLETS BY MOUTH EVERY DAY 07/08/14  Yes Josue Hector, MD  levothyroxine (SYNTHROID, LEVOTHROID) 100 MCG tablet Take 100 mcg by mouth daily before breakfast.   Yes Historical Provider, MD  loratadine (CLARITIN) 10 MG tablet Take 1 tablet (10 mg total) by mouth daily. 05/12/14  Yes Barton Dubois, MD  lovastatin (MEVACOR) 10 MG tablet Take 10 mg by mouth at bedtime.    Yes Historical Provider, MD  magnesium oxide (MAG-OX) 400 MG tablet Take 400 mg by mouth at bedtime.    Yes Historical Provider, MD  metolazone (ZAROXOLYN) 2.5 MG tablet Take 1 tablet (2.5 mg total) by mouth 2 (two) times a week. Monday and Friday 05/12/14  Yes Barton Dubois, MD  nitroGLYCERIN (NITROSTAT) 0.4 MG SL tablet Place 0.4 mg under the tongue every 5 (five) minutes  as needed for chest pain.   Yes Historical Provider, MD  ondansetron (ZOFRAN) 4 MG tablet Take 1 tablet (4 mg total) by mouth every 6 (six) hours. 07/01/14  Yes Britt Bottom, NP  oxyCODONE-acetaminophen (PERCOCET/ROXICET) 5-325 MG per tablet Take 1 tablet by mouth every 6 (six) hours as needed for severe pain. 07/30/14  Yes Monica [employer], DO  pantoprazole (PROTONIX) 40 MG tablet Take 40 mg by mouth 2 (two) times daily.    Yes Historical Provider, MD  potassium chloride SA (K-DUR,KLOR-CON) 20 MEQ tablet Take 40 mEq by mouth 2 (two) times daily.  11/22/13  Yes Scott T Kathlen Mody, PA-C  promethazine (PHENERGAN) 25 MG tablet Take 25 mg by mouth every 8 (eight) hours as needed for nausea or vomiting.  07/17/14  Yes Historical Provider, MD  silver sulfADIAZINE (SILVADENE) 1 % cream Apply 1 application topically daily. For irration on hip per family member   Yes Historical Provider, MD  triamcinolone cream (KENALOG) 0.1 % Apply 1 application topically 2 (two) times daily as needed (for rash).  07/17/14  Yes Historical Provider, MD  warfarin (COUMADIN) 2 MG tablet Take 2-3 mg by mouth daily at 6 PM. Takes $RemoveB'2mg'EKRVaHOg$  everyday except $RemoveBeforeD'3mg'eqcoyNDhrFhxFz$  on Monday's and Friday's   Yes Historical Provider, MD   No results found.  Positive ROS: All other systems have been reviewed and were otherwise negative with the exception of those mentioned in the HPI and as above.  Labs cbc  Recent Labs  08/10/14 0147  08/11/14 0136 08/11/14 0855  WBC 4.7  --   --   --   HGB 9.2*  < > 9.9* 9.6*  HCT 27.9*  < > 30.6* 29.9*  PLT 123*  --   --   --   < > = values in this interval not displayed.  Labs inflam No results for input(s): CRP in the last  72 hours.  Invalid input(s): ESR  Labs coag  Recent Labs  08/10/14 0147 08/11/14 0136  INR 1.72* 2.12*     Recent Labs  08/10/14 0147 08/10/14 1240 08/11/14 0136  NA 139  --  140  K 2.7* 3.3* 3.9  CL 107  --  108  CO2 24  --  21  GLUCOSE 88  --  96  BUN 81*   --  73*  CREATININE 2.45*  --  2.38*  CALCIUM 8.4  --  8.7    Physical Exam: Filed Vitals:   08/11/14 0503  BP: 123/43  Pulse: 60  Temp: 98.5 F (36.9 C)  Resp: 20   General: Alert, no acute distress Cardiovascular: some pedal edema Respiratory: No cyanosis, no use of accessory musculature GI: No organomegaly, abdomen is soft and non-tender Skin: No lesions in the area of chief complaint other than those listed below in MSK exam.  Neurologic: Sensation intact distally Psychiatric: Patient is competent for consent with normal mood and affect Lymphatic: No axillary or cervical lymphadenopathy, edema noted  MUSCULOSKELETAL:  RLE: joint line tenderness, no erythema, minimal effusion. Distally NVI.  Other extremities are atraumatic with painless ROM and NVI.  Assessment: Severe R knee arthritis  Plan: I performed a Depomedrol injection Mobilize with PT Weight Bearing Status: WBAT  Procedure: 08/12/2014  8:09 AM  PATIENT:  [name]    PRE-PROCEDURE DIAGNOSIS:  R knee OA, primary  POST-OPERATIVE DIAGNOSIS:  Same  PROCEDURE:  Intra-articular injection right knee  PROCEDURE DETAILS:  After informed verbal consent was obtained the superolateral portal was prepped with alcohol and the knee was injected with 4 ml of Marcaine and 1 ml of Depo-Medrol ($RemoveBeforeD'80mg'XdsbPYhsXgkjMO$ ) was injected. She tolerated this well and a Band-Aid was placed.     I gave her my card and she may follow up on an as needed basis when pain returns. I will sign off at this time.    Edmonia Lynch, D, MD Cell [phone]   08/11/2014 10:23 AM

## 2014-08-12 ENCOUNTER — Encounter (HOSPITAL_COMMUNITY): Payer: Self-pay | Admitting: Gastroenterology

## 2014-08-12 DIAGNOSIS — K920 Hematemesis: Secondary | ICD-10-CM | POA: Insufficient documentation

## 2014-08-12 DIAGNOSIS — R110 Nausea: Secondary | ICD-10-CM

## 2014-08-12 LAB — BASIC METABOLIC PANEL
Anion gap: 10 (ref 5–15)
BUN: 70 mg/dL — AB (ref 6–23)
CHLORIDE: 103 meq/L (ref 96–112)
CO2: 24 mmol/L (ref 19–32)
Calcium: 8.8 mg/dL (ref 8.4–10.5)
Creatinine, Ser: 2.57 mg/dL — ABNORMAL HIGH (ref 0.50–1.10)
GFR calc Af Amer: 20 mL/min — ABNORMAL LOW (ref >90)
GFR calc non Af Amer: 17 mL/min — ABNORMAL LOW (ref >90)
Glucose, Bld: 181 mg/dL — ABNORMAL HIGH (ref 70–99)
Potassium: 3.7 mmol/L (ref 3.5–5.1)
Sodium: 137 mmol/L (ref 135–145)

## 2014-08-12 LAB — HEMOGLOBIN AND HEMATOCRIT, BLOOD
HCT: 28.6 % — ABNORMAL LOW (ref 36.0–46.0)
HCT: 29.7 % — ABNORMAL LOW (ref 36.0–46.0)
Hemoglobin: 9.1 g/dL — ABNORMAL LOW (ref 12.0–15.0)
Hemoglobin: 9.5 g/dL — ABNORMAL LOW (ref 12.0–15.0)

## 2014-08-12 LAB — PROTIME-INR
INR: 2.19 — ABNORMAL HIGH (ref 0.00–1.49)
Prothrombin Time: 24.6 seconds — ABNORMAL HIGH (ref 11.6–15.2)

## 2014-08-12 MED ORDER — PANTOPRAZOLE SODIUM 40 MG PO TBEC
40.0000 mg | DELAYED_RELEASE_TABLET | Freq: Two times a day (BID) | ORAL | Status: DC
  Administered 2014-08-12 – 2014-08-13 (×2): 40 mg via ORAL
  Filled 2014-08-12 (×3): qty 1

## 2014-08-12 NOTE — Progress Notes (Signed)
   See earlier note also. I went over things with patient and son (by phone) and it is clear that she has hematemesis and melena. So, colonoscopy not indicated.  At this point no further GI w/u.  Would plan for restarting warfarin to reduce stroke risk which would be worse than a GI bleed most likely.  If she bleeds again would pursue EGD sooner rather than later to try see if we can identify the bleeding site - which is acting like a Dieaulafoy's lesion which can be hard to ID. (artery protrudes through mucosa and bleeds and retracts)  Son understands plan as does patient.  Please call if other ?  No need for GI visit as outpatient as waiting to see if she bleeds again.  Would aim for low therapeutic INR.  Gatha Mayer, MD, [facility] [phone] (pager) 08/12/2014 5:31 PM

## 2014-08-12 NOTE — Care Management Note (Signed)
CARE MANAGEMENT NOTE 08/12/2014  Patient:  DEZTINY, SARRA   Account Number:  [id number]  Date Initiated:  08/12/2014  Documentation initiated by:  Marney Doctor  Subjective/Objective Assessment:   79 yo admitted with GI BLEED     Action/Plan:   From [facility]   Anticipated DC Date:  08/13/2014   Anticipated DC Plan:  [place]  CM consult      [facility] Choice  HOME HEALTH   Choice offered to / List presented to:  C-1 Patient        [place] arranged  [place]   Status of service:  In process, will continue to follow Medicare Important Message given?   (If response is "NO", the following Medicare IM given date fields will be blank) Date Medicare IM given:   Medicare IM given by:   Date Additional Medicare IM given:   Additional Medicare IM given by:    Discharge Disposition:    Per UR Regulation:  Reviewed for med. necessity/level of care/duration of stay  If discussed at [place] of Stay Meetings, dates discussed:    Comments:  08/12/14 Marney Doctor RN,BSN,NCM PT is recommending HHPT. OT to evaluate as well.  Met with pt and she was given choice for [facility].  Pt has used [place] before and would like to use them again.  [facility] rep called to give referral.  CM will continue to follow and assist with needs.

## 2014-08-12 NOTE — Progress Notes (Signed)
    Progress Note   Subjective  no further bleeding. Feels okay   Objective   Vital signs in last 24 hours: Temp:  [97.5 F (36.4 C)-98.5 F (36.9 C)] 97.5 F (36.4 C) (12/29 0439) Pulse Rate:  [60-65] 64 (12/29 0439) Resp:  [16-20] 16 (12/29 0439) BP: (126-151)/(48-67) 151/64 mmHg (12/29 0439) SpO2:  [93 %-97 %] 95 % (12/29 0904) Weight:  [183 lb 13.8 oz (83.4 kg)] 183 lb 13.8 oz (83.4 kg) (12/29 0439) Last BM Date: 08/11/14 General:    white female in NAD Abdomen:  Soft, nontender and nondistended. Normal bowel sounds. Lungs: Respirations even and unlabored. Bibasilar inspiratory crackles Neurologic:  Alert and oriented,  grossly normal neurologically. Psych:  Cooperative. Normal mood and affect.  Lab Results:  Recent Labs  08/10/14 0147  08/11/14 1718 08/12/14 0155 08/12/14 0909  WBC 4.7  --   --   --   --   HGB 9.2*  < > 9.5* 9.1* 9.5*  HCT 27.9*  < > 29.4* 28.6* 29.7*  PLT 123*  --   --   --   --   < > = values in this interval not displayed. BMET  Recent Labs  08/10/14 0147 08/10/14 1240 08/11/14 0136 08/12/14 0154  NA 139  --  140 137  K 2.7* 3.3* 3.9 3.7  CL 107  --  108 103  CO2 24  --  21 24  GLUCOSE 88  --  96 181*  BUN 81*  --  73* 70*  CREATININE 2.45*  --  2.38* 2.57*  CALCIUM 8.4  --  8.7 8.8     Assessment / Plan:   79 year old female with GI bleeding on coumadin. EGD/enteroscopy unremarkable.  [facility] Endocapsule complete, will try and get a preliminary reading. May need colonoscopy depending on results. No further bleeding in 48 hours (off anti-coagulants).     LOS: 5 days   Tye Savoy  08/12/2014, 9:40 AM   Capsule endoscopy from yesterday is negative for bleeding source though is an incomplete exam.  Confusing picture here - she has sounded most like upper GI bleeding both admits this month.   A colonoscopy could be reasonable but with hx hematemesis and melena not sure its needed. Have to continue to wonder if she has had a  Dieaulafoy's lesion of stomach which can be elusive to identify.  Will discuss with patient.   Gatha Mayer, MD, [facility]

## 2014-08-12 NOTE — Evaluation (Signed)
Physical Therapy Evaluation Patient Details Name: [name] MRN: [id number] DOB: [birth year] Today's Date: 08/12/2014   History of Present Illness  [name] is a 79 y.o. female who complains chronic R knee pain. She was admitted with a GI bleed and R knee pain limits her mobility. She has been treated in the past with cortisone injections several years ago.  Pt recieved injection to R knee 08/12/14  Clinical Impression  Pt admitted with GIB and presenting with functional mobility limitations 2* generalized weakness, ltd endurance, and residual R knee pain(much improved since injections).  Pt plans d.c to home with 24/7 assist of sons and would benefit from follow up [place].    Follow Up Recommendations Home health PT    Equipment Recommendations  None recommended by PT    Recommendations for Other Services OT consult     Precautions / Restrictions Precautions Precautions: Fall Restrictions Weight Bearing Restrictions: No      Mobility  Bed Mobility Overal bed mobility: Needs Assistance Bed Mobility: Supine to Sit     Supine to sit: Min guard (used bed rail to self assist)        Transfers Overall transfer level: Needs assistance Equipment used: Rolling walker (2 wheeled) Transfers: Sit to/from Stand Sit to Stand: Min assist;Mod assist         General transfer comment: cues for transition position and use of UEs to self assist  Ambulation/Gait Ambulation/Gait assistance: Min assist Ambulation Distance (Feet): 22 Feet (and 15) Assistive device: Rolling walker (2 wheeled) Gait Pattern/deviations: Step-to pattern;Step-through pattern;Decreased step length - right;Decreased step length - left;Shuffle;Trunk flexed Gait velocity: decr   General Gait Details: cues for posture and position from RW.  Decreased pace with initial buckling at R knee noted but improved with increased distance ambulated  Stairs            Wheelchair Mobility     Modified Rankin (Stroke Patients Only)       Balance                                             Pertinent Vitals/Pain Pain Assessment: 0-10 Pain Score: 4  Pain Location: R knee Pain Descriptors / Indicators: Sore Pain Intervention(s): Limited activity within patient's tolerance;Monitored during session    Home Living Family/patient expects to be discharged to:: Private residence Living Arrangements: Children Available Help at Discharge: Family;Available 24 hours/day Type of Home: House Home Access: Stairs to enter Entrance Stairs-Rails: Psychiatric nurse of Steps: 2 Home Layout: One level Home Equipment: Walker - 2 wheels;Bedside commode;Electric scooter Additional Comments: Pt states her sons help her get up and down as needed    Prior Function Level of Independence: Needs assistance   Gait / Transfers Assistance Needed: RW  ADL's / Homemaking Assistance Needed: pt reports someone helps her with bathing and dressing        Hand Dominance   Dominant Hand: Right    Extremity/Trunk Assessment   Upper Extremity Assessment: Generalized weakness           Lower Extremity Assessment: RLE deficits/detail RLE Deficits / Details: -10 knee ext to 95 knee flex with strength 3+/5 at quads    Cervical / Trunk Assessment: Kyphotic  Communication   Communication: HOH  Cognition Arousal/Alertness: Awake/alert Behavior During Therapy: WFL for tasks assessed/performed Overall Cognitive Status: Within Functional Limits for tasks  assessed                      General Comments      Exercises        Assessment/Plan    PT Assessment Patient needs continued PT services  PT Diagnosis Difficulty walking;Generalized weakness   PT Problem List Decreased strength;Decreased range of motion;Decreased activity tolerance;Decreased balance;Decreased mobility;Decreased knowledge of use of DME;Obesity;Pain  PT Treatment  Interventions DME instruction;Gait training;Stair training;Functional mobility training;Therapeutic activities;Therapeutic exercise;Patient/family education   PT Goals (Current goals can be found in the Care Plan section) Acute Rehab PT Goals Patient Stated Goal: HOME - not back to rehab PT Goal Formulation: With patient Time For Goal Achievement: 08/09/14 Potential to Achieve Goals: Fair    Frequency Min 3X/week   Barriers to discharge        Co-evaluation               End of Session Equipment Utilized During Treatment: Gait belt Activity Tolerance: Patient tolerated treatment well;Patient limited by fatigue Patient left: in chair;with call bell/phone within reach Nurse Communication: Mobility status         Time: 3335-4562 PT Time Calculation (min) (ACUTE ONLY): 23 min   Charges:   PT Evaluation $Initial PT Evaluation Tier I: 1 Procedure PT Treatments $Gait Training: 8-22 mins   PT G Codes:        Kanda Deluna 2014-09-09, 1:22 PM

## 2014-08-12 NOTE — Progress Notes (Signed)
[facility] PROGRESS NOTE  ALIXIS HARMON XHB:[id number] DOB: July 07, 1935 DOA: 08/07/2014 PCP: Tamsen Roers, MD   Interim summary: [name] is a 79 y.o. female with h/o CAD, atrial fibrillation on coumadin, hypertension, hyperlipidemia, stage 4 CKD, h/o GI bleed from NSAID use Int he past , recently admitted and discharged for evaluation of GI bleed comes back for blood per rectum and vomiting blood.   Assessment/Plan: BRBPR and hematemesis: unclear etiology, possibly mucosal bleed from supratherapeutic INR. Holding coumadin. Coumadin reversal with vitamin K AND FFP on admission. Her EGD on last admission showed esophageal stricture. Last colonoscopy was in 2011. Started her on IV protonix, later on transitioned to po protonix.  Gastroenterology consulted and recommendations given.  On 12/25 pt had 3 more episodes of bloody bowel movements with marron colored blood. 2 more units of prbc and 2 units of FFP ordered and she was transferred to step down. She underwent enteroscopy and EGD on 12/26 , did not reveal any abnormalities.  Capsule endoscopy, was incomplete due to food debris in the small bowel. Plan for colonoscopy as per gi.    Hypothyroidism: Resume synthroid.    Hypertension:  BP well controlled.   CAD/ atrial fibrillation: She denies any chest pain or sob. Resume medications orther than aspirin and coumadin. Keep hemoglobin greater than 9.  Rate controlled atrial fibrillation.    Right knee pain and swelling: -x ray of the knee showed severe osteoarthritis with mod sized effusion. Orthopedics consulted and she received a cortisone shot in the knee. She is able to participate in the physical therapy.   Code Status: DNR Family Communication: NONE at bedside today Disposition Plan: pending.    Consultants:  gastroenterology  Procedures: EGD, enteroscopy.  Antibiotics:  none  HPI/Subjective: Feeling much better, no nausea, or hematemesis. 2  bowel movements with streaks of bleed.   Objective: Filed Vitals:   08/12/14 1424  BP: 137/52  Pulse: 63  Temp: 97.6 F (36.4 C)  Resp: 18    Intake/Output Summary (Last 24 hours) at 08/12/14 1616 Last data filed at 08/12/14 1427  Gross per 24 hour  Intake    360 ml  Output   2150 ml  Net  -1790 ml   Filed Weights   08/10/14 0500 08/11/14 0503 08/12/14 0439  Weight: 86.5 kg (190 lb 11.2 oz) 83.7 kg (184 lb 8.4 oz) 83.4 kg (183 lb 13.8 oz)    Exam:   General:  Alert afebrile comfortbale  Cardiovascular: s1s2  Respiratory: ctab  Abdomen: soft NT ND BS+  Musculoskeletal: Trace pedal edema. Right knee swelling.   Data Reviewed: Basic Metabolic Panel:  Recent Labs Lab 08/07/14 0933 08/10/14 0147 08/10/14 1240 08/11/14 0136 08/12/14 0154  NA 140 139  --  140 137  K 3.8 2.7* 3.3* 3.9 3.7  CL 108 107  --  108 103  CO2 24 24  --  21 24  GLUCOSE 92 88  --  96 181*  BUN 99* 81*  --  73* 70*  CREATININE 2.86* 2.45*  --  2.38* 2.57*  CALCIUM 8.6 8.4  --  8.7 8.8  MG  --   --  1.4*  --   --    Liver Function Tests:  Recent Labs Lab 08/07/14 0933  AST 53*  ALT 24  ALKPHOS 82  BILITOT 2.0*  PROT 6.2  ALBUMIN 2.4*    Recent Labs Lab 08/07/14 0933  LIPASE 40   No results for input(s): AMMONIA in the last  168 hours. CBC:  Recent Labs Lab 08/07/14 0933  08/10/14 0147  08/11/14 0136 08/11/14 0855 08/11/14 1718 08/12/14 0155 08/12/14 0909  WBC 6.4  --  4.7  --   --   --   --   --   --   NEUTROABS 5.1  --   --   --   --   --   --   --   --   HGB 8.3*  < > 9.2*  < > 9.9* 9.6* 9.5* 9.1* 9.5*  HCT 25.4*  < > 27.9*  < > 30.6* 29.9* 29.4* 28.6* 29.7*  MCV 91.0  --  89.4  --   --   --   --   --   --   PLT 202  --  123*  --   --   --   --   --   --   < > = values in this interval not displayed. Cardiac Enzymes: No results for input(s): CKTOTAL, CKMB, CKMBINDEX, TROPONINI in the last 168 hours. BNP (last 3 results)  Recent Labs  10/28/13 1450  05/07/14 1659 05/10/14 0508  PROBNP 1368.0* 11688.0* 12366.0*   CBG: No results for input(s): GLUCAP in the last 168 hours.  Recent Results (from the past 240 hour(s))  MRSA PCR Screening     Status: None   Collection Time: 08/07/14 12:43 PM  Result Value Ref Range Status   MRSA by PCR NEGATIVE NEGATIVE Final    Comment:        The GeneXpert MRSA Assay (FDA approved for NASAL specimens only), is one component of a comprehensive MRSA colonization surveillance program. It is not intended to diagnose MRSA infection nor to guide or monitor treatment for MRSA infections.      Studies: No results found.  Scheduled Meds: . sodium chloride   Intravenous Once  . sodium chloride   Intravenous Once  . antiseptic oral rinse  7 mL Mouth Rinse q12n4p  . chlorhexidine  15 mL Mouth Rinse BID  . furosemide  80 mg Oral BID  . isosorbide mononitrate  120 mg Oral Daily  . levothyroxine  100 mcg Oral QAC breakfast  . metolazone  2.5 mg Oral Once per day on Mon Thu  . mometasone-formoterol  2 puff Inhalation BID  . pantoprazole (PROTONIX) IV  40 mg Intravenous BID  . sodium chloride  3 mL Intravenous Q12H   Continuous Infusions:    Active Problems:   GI bleed   Acute blood loss anemia   Gastrointestinal hemorrhage with melena   Pain    Time spent: 25 min    [place] [facility] Pager [phone]. If 7PM-7AM, please contact night-coverage at [email or web address], password [facility] 08/12/2014, 4:16 PM  LOS: 5 days

## 2014-08-12 NOTE — Progress Notes (Signed)
Clinical Social Work Department BRIEF PSYCHOSOCIAL ASSESSMENT 08/12/2014  Patient:  [name], [name]     Account Number:  [id number]     Admit date:  08/07/2014  Clinical Social Worker:  Lacie Scotts  Date/Time:  08/12/2014 12:34 PM  Referred by:  Physician  Date Referred:  08/12/2014 Referred for  SNF Placement   Other Referral:   Interview type:  Patient Other interview type:    PSYCHOSOCIAL DATA Living Status:  FACILITY Admitted from facility:  [facility] [place] Level of care:  [place] Primary support name:  Lawson Radar Primary support relationship to patient:  CHILD, ADULT Degree of support available:   supportive    CURRENT CONCERNS Current Concerns  Post-Acute Placement   Other Concerns:    SOCIAL WORK ASSESSMENT / PLAN Pt is a 79 yr old female admitted from [facility].   CSW met with pt and PT in room to assist with d/cplanning.Pt declining to return to  SNF following hospital d/c. PT feels pt can return home provided she has 24/7 support. Pt's son contacted  with pt's permission. Son confirmed pt would not be left alone. Plan reviewed with MD. RNCM alerted and will assist with d/c planning. Pt will accept [facility] services .   Assessment/plan status:  No Further Intervention Required Other assessment/ plan:   Information/referral to community resources:   Case referred to [facility] for [facility] service assistance.    PATIENT'S/FAMILY'S RESPONSE TO PLAN OF CARE: " I'm not going back to [place]. "  Both pt / son shared concerns r/t placement. " It took more than 20 minutes for cna assistance." Pt / son felt the rehab was very good. Feedback shared with SNF. Pt is looking forward to returning home when stable. Son supports this plan.   Werner Lean LCSW [phone]

## 2014-08-12 NOTE — Procedures (Addendum)
Findings:  Incomplete study - capsule spent 2 hrs in esophagus and 2 hours in stomach.  Proximal small bowel had food debris and mid small bowel was clear and normal.  Battery did not last to allow complete study of small bowel.  Rec:  Will need to consider colonoscopy but hx has suggested UGI bleed w/ hematemesis and melena  Note study performed 12/28 and read 12/29

## 2014-08-13 LAB — CBC
HEMATOCRIT: 27.8 % — AB (ref 36.0–46.0)
HEMOGLOBIN: 9 g/dL — AB (ref 12.0–15.0)
MCH: 29.5 pg (ref 26.0–34.0)
MCHC: 32.4 g/dL (ref 30.0–36.0)
MCV: 91.1 fL (ref 78.0–100.0)
Platelets: 121 10*3/uL — ABNORMAL LOW (ref 150–400)
RBC: 3.05 MIL/uL — AB (ref 3.87–5.11)
RDW: 17.5 % — ABNORMAL HIGH (ref 11.5–15.5)
WBC: 4.8 10*3/uL (ref 4.0–10.5)

## 2014-08-13 LAB — BASIC METABOLIC PANEL
ANION GAP: 9 (ref 5–15)
BUN: 74 mg/dL — AB (ref 6–23)
CALCIUM: 8.9 mg/dL (ref 8.4–10.5)
CHLORIDE: 101 meq/L (ref 96–112)
CO2: 25 mmol/L (ref 19–32)
CREATININE: 2.69 mg/dL — AB (ref 0.50–1.10)
GFR calc Af Amer: 19 mL/min — ABNORMAL LOW (ref >90)
GFR calc non Af Amer: 16 mL/min — ABNORMAL LOW (ref >90)
GLUCOSE: 111 mg/dL — AB (ref 70–99)
Potassium: 2.9 mmol/L — ABNORMAL LOW (ref 3.5–5.1)
Sodium: 135 mmol/L (ref 135–145)

## 2014-08-13 LAB — MAGNESIUM: Magnesium: 1.7 mg/dL (ref 1.5–2.5)

## 2014-08-13 MED ORDER — OXYCODONE-ACETAMINOPHEN 5-325 MG PO TABS: 1.0000 | ORAL_TABLET | Freq: Four times a day (QID) | ORAL | Status: AC | PRN

## 2014-08-13 MED ORDER — NITROGLYCERIN 0.4 MG SL SUBL: 0.4000 mg | SUBLINGUAL_TABLET | SUBLINGUAL | Status: AC | PRN

## 2014-08-13 MED ORDER — HYDRALAZINE HCL 25 MG PO TABS: 25.0000 mg | ORAL_TABLET | Freq: Three times a day (TID) | ORAL | Status: DC

## 2014-08-13 MED ORDER — FLUTICASONE-SALMETEROL 100-50 MCG/DOSE IN AEPB: 1.0000 | INHALATION_SPRAY | Freq: Two times a day (BID) | RESPIRATORY_TRACT | Status: DC

## 2014-08-13 MED ORDER — MAGNESIUM OXIDE 400 MG PO TABS: 400.0000 mg | ORAL_TABLET | Freq: Every day | ORAL | Status: AC

## 2014-08-13 MED ORDER — POTASSIUM CHLORIDE CRYS ER 20 MEQ PO TBCR
40.0000 meq | EXTENDED_RELEASE_TABLET | Freq: Once | ORAL | Status: AC
  Administered 2014-08-13: 40 meq via ORAL
  Filled 2014-08-13: qty 2

## 2014-08-13 MED ORDER — COLCHICINE 0.6 MG PO TABS: 0.6000 mg | ORAL_TABLET | Freq: Two times a day (BID) | ORAL | Status: DC | PRN

## 2014-08-13 MED ORDER — ERGOCALCIFEROL 1.25 MG (50000 UT) PO CAPS: 50000.0000 [IU] | ORAL_CAPSULE | ORAL | Status: AC

## 2014-08-13 MED ORDER — PANTOPRAZOLE SODIUM 40 MG PO TBEC: 40.0000 mg | DELAYED_RELEASE_TABLET | Freq: Two times a day (BID) | ORAL | Status: AC

## 2014-08-13 MED ORDER — ONDANSETRON HCL 4 MG PO TABS: 4.0000 mg | ORAL_TABLET | Freq: Three times a day (TID) | ORAL | Status: AC | PRN

## 2014-08-13 MED ORDER — LOVASTATIN 10 MG PO TABS: 10.0000 mg | ORAL_TABLET | Freq: Every day | ORAL | Status: AC

## 2014-08-13 MED ORDER — LORATADINE 10 MG PO TABS: 10.0000 mg | ORAL_TABLET | Freq: Every day | ORAL | Status: AC

## 2014-08-13 MED ORDER — PROMETHAZINE HCL 25 MG PO TABS: 25.0000 mg | ORAL_TABLET | Freq: Three times a day (TID) | ORAL | Status: AC | PRN

## 2014-08-13 MED ORDER — ISOSORBIDE MONONITRATE ER 60 MG PO TB24: 120.0000 mg | ORAL_TABLET | Freq: Every day | ORAL | Status: AC

## 2014-08-13 MED ORDER — POTASSIUM CHLORIDE CRYS ER 20 MEQ PO TBCR: 40.0000 meq | EXTENDED_RELEASE_TABLET | Freq: Two times a day (BID) | ORAL | Status: DC

## 2014-08-13 MED ORDER — WARFARIN SODIUM 1 MG PO TABS: 1.0000 mg | ORAL_TABLET | Freq: Every day | ORAL | Status: DC

## 2014-08-13 MED ORDER — ALPRAZOLAM 0.5 MG PO TABS: 0.5000 mg | ORAL_TABLET | Freq: Every evening | ORAL | Status: AC | PRN

## 2014-08-13 MED ORDER — METOLAZONE 2.5 MG PO TABS: 2.5000 mg | ORAL_TABLET | ORAL | Status: AC

## 2014-08-13 MED ORDER — FUROSEMIDE 80 MG PO TABS: 80.0000 mg | ORAL_TABLET | Freq: Two times a day (BID) | ORAL | Status: AC

## 2014-08-13 MED ORDER — LEVOTHYROXINE SODIUM 100 MCG PO TABS: 100.0000 ug | ORAL_TABLET | Freq: Every day | ORAL | Status: AC

## 2014-08-13 NOTE — Discharge Instructions (Signed)

## 2014-08-13 NOTE — Care Management Note (Signed)
CARE MANAGEMENT NOTE 08/13/2014  Patient:  [name], [name]   Account Number:  [id number]  Date Initiated:  08/12/2014  Documentation initiated by:  [name]  Subjective/Objective Assessment:   79 yo admitted with GI BLEED     Action/Plan:   From [place]   Anticipated DC Date:  08/13/2014   Anticipated DC Plan:  [place]  CM consult      [facility] Choice  HOME HEALTH   Choice offered to / List presented to:  C-1 Patient        [place] arranged  [place] RN      [place].   Status of service:  In process, will continue to follow Medicare Important Message given?  YES (If response is "NO", the following Medicare IM given date fields will be blank) Date Medicare IM given:  08/13/2014 Medicare IM given by:  [name] Date Additional Medicare IM given:   Additional Medicare IM given by:    Discharge Disposition:    Per UR Regulation:  Reviewed for med. necessity/level of care/duration of stay  If discussed at [place] of Stay Meetings, dates discussed:    Comments:  08/13/14 Alexandria Doctor RN,BSN,NCM [phone] MD ordered HHPT/OT/RN.  MD would like PT/INR drawn on 08/15/13 or 08/16/13 and called to Dr Kem Kays (cardiologist).  Spoke with [name] from [facility], who states that they do not have the staff to fufill that need at this time.  Spoke with pt and son and they [facility] their second choice.  Referral called to [facility] rep. No other CM needs noted.  08/12/14 Alexandria Doctor RN,BSN,NCM PT is recommending HHPT. OT to evaluate as well.  Met with pt and she was given choice for [facility].  Pt has used [place] before and would like to use them again.  [facility] rep called to give referral.  CM will continue to follow and assist with needs.

## 2014-08-13 NOTE — Progress Notes (Signed)
K-2.9 this AM. Paged Baltazar Najjar, NP for order. Will continue to monitor.

## 2014-08-13 NOTE — Progress Notes (Signed)
OT Cancellation Note  Patient Details Name: [name] MRN: [id number] DOB: 03-19-1935   Cancelled Treatment:      Noted pt with low K+. Will check back after she receives dose.     [name] 08/13/2014, 10:05 AM  Lesle Chris, OTR/L [phone] 08/13/2014

## 2014-08-13 NOTE — Discharge Summary (Signed)
[facility]  Physician Discharge Summary   Patient ID: [name] MRN: [id number] DOB/AGE: 77-Jul-1936 77 y.o.  Admit date: 08/07/2014 Discharge date: 08/13/2014  PCP: Tamsen Roers, MD  DISCHARGE DIAGNOSES:  Active Problems:   GI bleed   Acute blood loss anemia   Gastrointestinal hemorrhage with melena   Pain   Hematemesis with nausea   RECOMMENDATIONS FOR OUTPATIENT FOLLOW UP: 1. Home health PT and RN. 2. PT/INR to be checked by the home health nurse  DISCHARGE CONDITION: fair  Diet recommendation: Heart healthy  Filed Weights   08/11/14 0503 08/12/14 0439 08/13/14 0532  Weight: 83.7 kg (184 lb 8.4 oz) 83.4 kg (183 lb 13.8 oz) 82.8 kg (182 lb 8.7 oz)    INITIAL HISTORY: [name] is a 79 y.o. female with h/o CAD, atrial fibrillation on coumadin, hypertension, hyperlipidemia, stage 4 CKD, h/o GI bleed from NSAID usein the past, recently admitted and discharged for evaluation of GI bleed came back for blood per rectum and vomiting blood.   Consultations:  Gastroenterology  Procedures: EGD 12/26 IMPRESSIONS:  1. The exam showed no abnormalities in the jejunum 2. The stomach was well visualized and normal in appearance 3. Normal appearing esophagus and GE junction 4. Normal appearing duodenum  Capsule study This was an incomplete study due to food debris along with the fact that the capsule stayed in her esophagus and stomach for prolonged period of time.  HOSPITAL COURSE:   BRBPR and hematemesis It was felt that her bleeding was mostly upper GI in origin. She underwent EGD quite recently. So gastroenterology initially monitored her. However, she had a recurrence so she underwent a repeat endoscopy. Impressions are as above. Capsule study was also done which was also inconclusive. Her hemoglobin remained stable. She hasn't had any recurrence of bleeding in the last 2 days. Colonoscopy was discussed by gastroenterology with the  patient and her son. At this time they are not proceeding with same. If the patient has recurrence of bleeding this may have to be done. Warfarin can be resumed with the lower INR goal.  Acute blood loss anemia. She was transfused PRBCs. Hemoglobin remained stable  Hypothyroidism: Resume synthroid.   Hypertension:  BP well controlled. Continue home medications  CAD/ atrial fibrillation: Rate controlled atrial fibrillation. Last 2 time she presented with GI bleeding her INR was elevated. We will reduce the dose of warfarin to 1 mg. Home health RN to check her INR on either Friday or Saturday. INR goal for her should be around 2-2.5. She was supposed to follow-up with Dr. Johnsie Cancel this week, but she was admitted. She will schedule follow-up.  Right knee pain and swelling: X ray of the knee showed severe osteoarthritis with mod sized effusion. Orthopedics was consulted and she received a cortisone shot in the knee. She is able to participate in the physical therapy. And is feeling much better. Home health will be arranged.  Potassium level was noted to be low. This was repleted.  Overall, she feels better. Discussed with the patient and her son in detail. She can be discharged home today.  PERTINENT LABS:  The results of significant diagnostics from this hospitalization (including imaging, microbiology, ancillary and laboratory) are listed below for reference.    Microbiology: Recent Results (from the past 240 hour(s))  MRSA PCR Screening     Status: None   Collection Time: 08/07/14 12:43 PM  Result Value Ref Range Status   MRSA by PCR NEGATIVE NEGATIVE Final  Comment:        The GeneXpert MRSA Assay (FDA approved for NASAL specimens only), is one component of a comprehensive MRSA colonization surveillance program. It is not intended to diagnose MRSA infection nor to guide or monitor treatment for MRSA infections.      Labs: Basic Metabolic Panel:  Recent Labs Lab  08/07/14 0933 08/10/14 0147 08/10/14 1240 08/11/14 0136 08/12/14 0154 08/13/14 0419  NA 140 139  --  140 137 135  K 3.8 2.7* 3.3* 3.9 3.7 2.9*  CL 108 107  --  108 103 101  CO2 24 24  --  21 24 25   GLUCOSE 92 88  --  96 181* 111*  BUN 99* 81*  --  73* 70* 74*  CREATININE 2.86* 2.45*  --  2.38* 2.57* 2.69*  CALCIUM 8.6 8.4  --  8.7 8.8 8.9  MG  --   --  1.4*  --   --  1.7   Liver Function Tests:  Recent Labs Lab 08/07/14 0933  AST 53*  ALT 24  ALKPHOS 82  BILITOT 2.0*  PROT 6.2  ALBUMIN 2.4*    Recent Labs Lab 08/07/14 0933  LIPASE 40   CBC:  Recent Labs Lab 08/07/14 0933  08/10/14 0147  08/11/14 0855 08/11/14 1718 08/12/14 0155 08/12/14 0909 08/13/14 0419  WBC 6.4  --  4.7  --   --   --   --   --  4.8  NEUTROABS 5.1  --   --   --   --   --   --   --   --   HGB 8.3*  < > 9.2*  < > 9.6* 9.5* 9.1* 9.5* 9.0*  HCT 25.4*  < > 27.9*  < > 29.9* 29.4* 28.6* 29.7* 27.8*  MCV 91.0  --  89.4  --   --   --   --   --  91.1  PLT 202  --  123*  --   --   --   --   --  121*  < > = values in this interval not displayed.   IMAGING STUDIES  Dg Knee Complete 4 Views Right  08/08/2014   CLINICAL DATA:  [place] 2 weeks go. Patient complains of right anterior knee pain that radiates to the lateral aspect.  EXAM: RIGHT KNEE - COMPLETE 4+ VIEW  COMPARISON:  None.  FINDINGS: There is diffuse joint space loss throughout the right knee. Evidence for a moderate-sized joint effusion. There is osteophytosis in the knee compartments and evidence for chondrocalcinosis. Negative for an acute fracture or dislocation. There are vascular calcifications.  IMPRESSION: Moderate to severe osteoarthritis in the right knee with a joint effusion. No acute bone abnormality.   Electronically Signed   By: Markus Daft M.D.   On: 08/08/2014 10:06    DISCHARGE EXAMINATION: Filed Vitals:   08/12/14 1424 08/12/14 2205 08/13/14 0532 08/13/14 1102  BP: 137/52 134/45 139/49   Pulse: 63 60 62   Temp: 97.6 F  (36.4 C) 97.7 F (36.5 C) 97.6 F (36.4 C)   TempSrc: Oral Oral Oral   Resp: 18 18 18    Height:      Weight:   82.8 kg (182 lb 8.7 oz)   SpO2: 95% 100% 98% 97%   General appearance: alert, cooperative, appears stated age and no distress Resp: clear to auscultation bilaterally Cardio: regular rate and rhythm, S1, S2 normal, systolic murmur present over precordium. No click, rub or gallop  GI: soft, non-tender; bowel sounds normal; no masses,  no organomegaly Extremities: extremities normal, atraumatic, no cyanosis or edema  DISPOSITION: Home with home health  Discharge Instructions    Call MD for:  difficulty breathing, headache or visual disturbances    Complete by:  As directed      Call MD for:  extreme fatigue    Complete by:  As directed      Call MD for:  persistant dizziness or light-headedness    Complete by:  As directed      Call MD for:  persistant nausea and vomiting    Complete by:  As directed      Call MD for:  severe uncontrolled pain    Complete by:  As directed      Call MD for:  temperature >100.4    Complete by:  As directed      Diet - low sodium heart healthy    Complete by:  As directed      Discharge instructions    Complete by:  As directed   Home health nurse to check PT/INR on 1/1 or 08/16/14. Watch closely for signs of bleeding or black stools.     Increase activity slowly    Complete by:  As directed            ALLERGIES:  Allergies  Allergen Reactions  . Amiodarone Hives  . Propylthiouracil Rash    Discharge Medication List as of 08/13/2014 11:40 AM    CONTINUE these medications which have CHANGED   Details  ALPRAZolam (XANAX) 0.5 MG tablet Take 1 tablet (0.5 mg total) by mouth at bedtime as needed for anxiety or sleep., Starting 08/13/2014, Until Discontinued, Print    colchicine 0.6 MG tablet Take 1 tablet (0.6 mg total) by mouth 2 (two) times daily as needed (Gout)., Starting 08/13/2014, Until Discontinued, Print    ergocalciferol  (VITAMIN D2) 50000 UNITS capsule Take 1 capsule (50,000 Units total) by mouth every Saturday. Take on saturdays, Starting 08/13/2014, Until Discontinued, Print    Fluticasone-Salmeterol (ADVAIR DISKUS) 100-50 MCG/DOSE AEPB Inhale 1 puff into the lungs 2 (two) times daily., Starting 08/13/2014, Until Discontinued, Print    furosemide (LASIX) 80 MG tablet Take 1 tablet (80 mg total) by mouth 2 (two) times daily., Starting 08/13/2014, Until Discontinued, Print    hydrALAZINE (APRESOLINE) 25 MG tablet Take 1 tablet (25 mg total) by mouth 3 (three) times daily., Starting 08/13/2014, Until Discontinued, Print    isosorbide mononitrate (IMDUR) 60 MG 24 hr tablet Take 2 tablets (120 mg total) by mouth daily., Starting 08/13/2014, Until Discontinued, Print    levothyroxine (SYNTHROID, LEVOTHROID) 100 MCG tablet Take 1 tablet (100 mcg total) by mouth daily before breakfast., Starting 08/13/2014, Until Discontinued, Print    loratadine (CLARITIN) 10 MG tablet Take 1 tablet (10 mg total) by mouth daily., Starting 08/13/2014, Until Discontinued, Print    lovastatin (MEVACOR) 10 MG tablet Take 1 tablet (10 mg total) by mouth at bedtime., Starting 08/13/2014, Until Discontinued, Print    magnesium oxide (MAG-OX) 400 MG tablet Take 1 tablet (400 mg total) by mouth at bedtime., Starting 08/13/2014, Until Discontinued, Print    metolazone (ZAROXOLYN) 2.5 MG tablet Take 1 tablet (2.5 mg total) by mouth 2 (two) times a week. Monday and Friday, Starting 08/13/2014, Until Discontinued, Print    nitroGLYCERIN (NITROSTAT) 0.4 MG SL tablet Place 1 tablet (0.4 mg total) under the tongue every 5 (five) minutes as needed for chest pain., Starting 08/13/2014,  Until Discontinued, Print    ondansetron (ZOFRAN) 4 MG tablet Take 1 tablet (4 mg total) by mouth every 8 (eight) hours as needed for nausea or vomiting., Starting 08/13/2014, Until Discontinued, Print    oxyCODONE-acetaminophen (PERCOCET/ROXICET) 5-325 MG per  tablet Take 1 tablet by mouth every 6 (six) hours as needed for severe pain., Starting 08/13/2014, Until Discontinued, Print    pantoprazole (PROTONIX) 40 MG tablet Take 1 tablet (40 mg total) by mouth 2 (two) times daily., Starting 08/13/2014, Until Discontinued, Print    potassium chloride SA (K-DUR,KLOR-CON) 20 MEQ tablet Take 2 tablets (40 mEq total) by mouth 2 (two) times daily., Starting 08/13/2014, Until Discontinued, Print    promethazine (PHENERGAN) 25 MG tablet Take 1 tablet (25 mg total) by mouth every 8 (eight) hours as needed for nausea or vomiting., Starting 08/13/2014, Until Discontinued, Print    warfarin (COUMADIN) 1 MG tablet Take 1 tablet (1 mg total) by mouth daily at 6 PM., Starting 08/13/2014, Until Discontinued, Print      CONTINUE these medications which have NOT CHANGED   Details  camphor-menthol (SARNA) lotion Apply topically as needed for itching., Starting 05/12/2014, Until Discontinued, Print    silver sulfADIAZINE (SILVADENE) 1 % cream Apply 1 application topically daily. For irration on hip per family member, Until Discontinued, Historical Med    triamcinolone cream (KENALOG) 0.1 % Apply 1 application topically 2 (two) times daily as needed (for rash). , Starting 07/17/2014, Until Discontinued, Historical Med      STOP taking these medications     aspirin 81 MG tablet        Follow-up Information    Follow up with LITTLE,JAMES, MD. Schedule an appointment as soon as possible for a visit in 1 week.   Specialty:  Family Medicine   Why:  post hospitalization follow up   Contact information:   [address] [place]  [address] [phone]       Follow up with Jenkins Rouge, MD. Schedule an appointment as soon as possible for a visit in 2 weeks.   Specialty:  Cardiology   Contact information:   [address] [address] [phone]       TOTAL DISCHARGE TIME: 35 mins  [facility] Pager  [phone]  08/13/2014, 2:18 PM

## 2014-08-16 ENCOUNTER — Telehealth: Payer: Self-pay | Admitting: Physician Assistant

## 2014-08-16 NOTE — Telephone Encounter (Signed)
Recieved a call regarding her INR. It was checked today and was 1.4. She has been taking 1 mg daily since discharge on 12/30 after a GI bleed.   Prior to her previous admission, she had been taking 2 mg daily with 3 mg on 2 days a week. Her INR was 3.8.  Will increase her Coumadin to 2 mg daily. Check INR on Friday, 01/08. Hopefully by then, she will be therapeutic and may have to lower the dose somewhat.  She is having no new bleeding issues or other concerns.  Rosaria Ferries, PA-C 08/16/2014 12:10 PM Beeper [phone]

## 2014-08-27 ENCOUNTER — Encounter: Payer: Medicare Other | Admitting: Internal Medicine

## 2014-09-15 NOTE — Progress Notes (Signed)
Patient ID: [name], female   DOB: 1935-04-11, 80 y.o.   MRN: [id number]            HPI: This is a 80 y.o. female patient who has history of CAD status post drug-eluting stent to the OM in 2006, rotational atherectomy and drug-eluting stent to the LAD in 9518, diastolic heart failure, paroxysmal atrial fibrillation status post pacemaker, and chronic kidney disease-stage IV hospital with acute on chronic congestive heart failure the presence of dietary indiscretion. She was diuresed approximately 10 pounds but this was limited do to renal failure. Dr. Arty Baumgartner follows her closely   The patient is doing much better at home. Physical therapy is working with her and she is starting to walk again. Her weight is down to 198 pounds(was 213 and an admission). She is taking Lasix 80 mg twice a day and metolazone 2.5 mg Mondays and Fridays. She denies any dyspnea, dyspnea on exertion, chest pain or edema. Her Coumadin will be checked by her primary care   D/C from hospital 08/13/14 with GI bleed Transfused Had EGD and capsule endoscopy but no colonoscopy  No source identified but ? Dieaulafoy's lesion that  Can be hard to ID unless EGD done during active bleeding   Since last Wendsday  Has had pain and swelling in left arm and hand.  Cannot move fingers Cannot bear weight No systemic fever   Allergies  Allergen Reactions  . Amiodarone Hives  . Propylthiouracil Rash     Current Outpatient Prescriptions  Medication Sig Dispense Refill  . ALPRAZolam (XANAX) 0.5 MG tablet Take 1 tablet (0.5 mg total) by mouth at bedtime as needed for anxiety or sleep. 30 tablet 0  . camphor-menthol (SARNA) lotion Apply topically as needed for itching. (Patient taking differently: Apply 1 application topically daily as needed for itching. ) 222 mL 0  . colchicine 0.6 MG tablet Take 1 tablet (0.6 mg total) by mouth 2 (two) times daily as needed (Gout). 60 tablet 0  . ergocalciferol (VITAMIN D2) 50000 UNITS capsule  Take 1 capsule (50,000 Units total) by mouth every Saturday. Take on saturdays 10 capsule 0  . Fluticasone-Salmeterol (ADVAIR DISKUS) 100-50 MCG/DOSE AEPB Inhale 1 puff into the lungs 2 (two) times daily. 1 each 2  . furosemide (LASIX) 80 MG tablet Take 1 tablet (80 mg total) by mouth 2 (two) times daily. 60 tablet 1  . hydrALAZINE (APRESOLINE) 25 MG tablet Take 1 tablet (25 mg total) by mouth 3 (three) times daily. 90 tablet 1  . isosorbide mononitrate (IMDUR) 60 MG 24 hr tablet Take 2 tablets (120 mg total) by mouth daily. 60 tablet 3  . levothyroxine (SYNTHROID, LEVOTHROID) 100 MCG tablet Take 1 tablet (100 mcg total) by mouth daily before breakfast. 30 tablet 2  . loratadine (CLARITIN) 10 MG tablet Take 1 tablet (10 mg total) by mouth daily. 30 tablet 1  . lovastatin (MEVACOR) 10 MG tablet Take 1 tablet (10 mg total) by mouth at bedtime. 30 tablet 2  . magnesium oxide (MAG-OX) 400 MG tablet Take 1 tablet (400 mg total) by mouth at bedtime. 30 tablet 0  . metolazone (ZAROXOLYN) 2.5 MG tablet Take 1 tablet (2.5 mg total) by mouth 2 (two) times a week. Monday and Friday 30 tablet 1  . nitroGLYCERIN (NITROSTAT) 0.4 MG SL tablet Place 1 tablet (0.4 mg total) under the tongue every 5 (five) minutes as needed for chest pain. 30 tablet 12  . ondansetron (ZOFRAN) 4 MG tablet Take  1 tablet (4 mg total) by mouth every 8 (eight) hours as needed for nausea or vomiting. 20 tablet 0  . oxyCODONE-acetaminophen (PERCOCET/ROXICET) 5-325 MG per tablet Take 1 tablet by mouth every 6 (six) hours as needed for severe pain. 30 tablet 0  . pantoprazole (PROTONIX) 40 MG tablet Take 1 tablet (40 mg total) by mouth 2 (two) times daily. 60 tablet 3  . potassium chloride SA (K-DUR,KLOR-CON) 20 MEQ tablet Take 2 tablets (40 mEq total) by mouth 2 (two) times daily. 60 tablet 0  . promethazine (PHENERGAN) 25 MG tablet Take 1 tablet (25 mg total) by mouth every 8 (eight) hours as needed for nausea or vomiting. 30 tablet 1  .  silver sulfADIAZINE (SILVADENE) 1 % cream Apply 1 application topically daily. For irration on hip per family member    . triamcinolone cream (KENALOG) 0.1 % Apply 1 application topically 2 (two) times daily as needed (for rash).   0  . warfarin (COUMADIN) 1 MG tablet Take 1 tablet (1 mg total) by mouth daily at 6 PM. 30 tablet 2  . [DISCONTINUED] FLUoxetine (PROZAC) 20 MG capsule Take 20 mg by mouth daily.       No current facility-administered medications for this visit.    Past Medical History  Diagnosis Date  . Cellulitis and abscess of unspecified site     a. h/o MSRA cellulitis of abdominal wall.  Marland Kitchen CAD (coronary artery disease)     a. DES to OM 01/2005. b. Rotational atherotomy/DES to prox LAD 02/2008. c. Cath 07/2013: stable, for med rx.  . Edema   . Warfarin anticoagulation     a. Followed by PCP.  Marland Kitchen GI bleed     a. EGD 01/2007: antral ulcers related to NSAID/aspirin use.  . Peripheral vascular disease   . Hyperthyroidism     a. Hx of intolerance to PTU.  Marland Kitchen Blood transfusion years ago    "heart problems" (09/26/2013)  . H/O hiatal hernia   . Umbilical hernia     unrepaired (11/24/11)  . Valvular heart disease     Echo 11/2011: mild-mod MR.  Marland Kitchen Hypertension     takes Amlodipine,Hydralazine,and Imdur daily  . Hyperlipidemia     takes Lovastatin daily  . SSS (sick sinus syndrome)     a. H/o AV node ablation 2008 secondary to difficult to control afib/flutter, with subsequent pacer.  . Atrial fibrillation with rapid ventricular response     a. H/o amiodarone thyroiditis. b. s/p AV node ablation 2008 with implantation of PPM 01/2007, upgraded to CRT-P 06/2007.;takes Coumadin daily  . GERD (gastroesophageal reflux disease)     takes Protonix daily  . Chronic diastolic CHF (congestive heart failure)     takes Lasix daily and Spironolactone   . Pacemaker     [employer]onic  . History of gout   . Hemorrhoids   . Depression with anxiety     takes Xanax daily  . Insomnia     takes  Xanax if needed  . Heart murmur   . CKD (chronic kidney disease)     Cr ~2.3.  . Kidney stones   . Myocardial infarction     "I've had 2-3; last one was in /1996" (09/26/2013)  . Migraines     "last one was a pretty good while ago" (09/26/2013)  . Arthritis     "all over" (09/26/2013)  . Chronic lower back pain     deteriorating disc  . Iron deficiency anemia  takes an Iron Pill daily  . Pneumonia     Past Surgical History  Procedure Laterality Date  . Esophagogastroduodenoscopy    . Tonsillectomy  ~ 1960  . Laminectomy and microdiscectomy lumbar spine  08/2003  . Cardioversion  10/2004; 02/2007    /E-chart  . Cholecystectomy    . Appendectomy  1954  . Vaginal hysterectomy  ?1981  . Av node ablation  02/2007    /E-chart  . Insert / replace / remove pacemaker  01/2007    initial placement  . Insert / replace / remove pacemaker  06/2007    upgraded/E-chart  . Av fistula placement, brachiocephalic Right 09/4399    "didn't work; my veins were too small"  . Joint replacement    . Total knee arthroplasty Left 01/2009  . Bladder and bowel  1970's    "tacked; damaged my intestines & had to remove some; done @ [facility]  . Cataract extraction w/ intraocular lens  implant, bilateral Bilateral   . Bascilic vein transposition Right 06/21/2013    Procedure: Kane;  Surgeon: Serafina Mitchell, MD;  Location: [facility] OR;  Service: Vascular;  Laterality: Right;  . Esophagogastroduodenoscopy N/A 07/19/2013    Procedure: ESOPHAGOGASTRODUODENOSCOPY (EGD);  Surgeon: Lafayette Dragon, MD;  Location: [facility] ENDOSCOPY;  Service: Endoscopy;  Laterality: N/A;  . Balloon dilation N/A 07/19/2013    Procedure: BALLOON DILATION;  Surgeon: Lafayette Dragon, MD;  Location: [facility] ENDOSCOPY;  Service: Endoscopy;  Laterality: N/A;  . Esophagogastroduodenoscopy N/A 07/20/2013    Procedure: ESOPHAGOGASTRODUODENOSCOPY (EGD);  Surgeon: Lafayette Dragon, MD;  Location: [facility] ENDOSCOPY;  Service: Endoscopy;   Laterality: N/A;  with removal of polyp in pylorus  . Bascilic vein transposition Right 09/20/2013    Procedure: 2ND STAGE RIGHT BASCILIC VEIN TRANSPOSITION; ULTRSOUND GUIDED;  Surgeon: Serafina Mitchell, MD;  Location: [facility] OR;  Service: Vascular;  Laterality: Right;  . Cardiac catheterization  2006; 2008; 2009; 01/13/2011  . Coronary angioplasty with stent placement  ~ 2007; ?    "had a total of 2 stents put in" 92/07/2014)  . Back surgery    . Left heart catheterization with coronary angiogram N/A 07/15/2013    Procedure: LEFT HEART CATHETERIZATION WITH CORONARY ANGIOGRAM;  Surgeon: Blane Ohara, MD;  Location: [facility] CATH LAB;  Service: Cardiovascular;  Laterality: N/A;  . Esophagogastroduodenoscopy N/A 07/25/2014    Procedure: ESOPHAGOGASTRODUODENOSCOPY (EGD);  Surgeon: Irene Shipper, MD;  Location: [facility] ENDOSCOPY;  Service: Endoscopy;  Laterality: N/A;  . Esophagogastroduodenoscopy N/A 08/09/2014    Procedure: ESOPHAGOGASTRODUODENOSCOPY (EGD);  Surgeon: Inda Castle, MD;  Location: [facility] ENDOSCOPY;  Service: Endoscopy;  Laterality: N/A;  Possible enteroscopy  . Givens capsule study N/A 08/11/2014    Procedure: GIVENS CAPSULE STUDY;  Surgeon: Inda Castle, MD;  Location: [facility] ENDOSCOPY;  Service: Endoscopy;  Laterality: N/A;  . Colonoscopy  last 2011    Family History  Problem Relation Age of Onset  . Colon cancer Neg Hx   . Thyroid disease Neg Hx   . Stroke Brother   . Hypertension Brother   . Kidney disease Brother   . Kidney disease Brother   . Other Mother     Sepsis and perforated colon  . Kidney disease Mother   . Other Father     Motor vehicle accident  . Learning disabilities Son   . Kidney disease Maternal Aunt   . Kidney disease Maternal Grandmother     History   Social History  .  Marital Status: Widowed    Spouse Name: N/A    Number of Children: N/A  . Years of Education: N/A   Occupational History  . Not on file.   Social History Main Topics  . Smoking status:  Former Smoker -- 1.00 packs/day for 50 years    Types: Cigarettes    Quit date: 07/13/1995  . Smokeless tobacco: Never Used     Comment: quit smoking around 1996  . Alcohol Use: No  . Drug Use: No  . Sexual Activity: No   Other Topics Concern  . Not on file   Social History Narrative   Lives in [place].    ROS: See history of present illness otherwise negative  BP 150/60 mmHg  Pulse 60  Ht 5\' 7"  (1.702 m)  Wt 81.103 kg (178 lb 12.8 oz)  BMI 28.00 kg/m2  SpO2 97%  PHYSICAL EXAM: Well-nournished, in no acute distress. Neck: No JVD, HJR, Bruit, or thyroid enlargement  Lungs: Decreased breath sounds at the bases but No tachypnea, clear without wheezing, rales, or rhonchi  Cardiovascular: Irregular irregular, PMI not displaced, 2/6 systolic murmur at the left sternal border, no gallops, bruit, thrill, or heave.  Abdomen: BS normal. Soft without organomegaly, masses, lesions or tenderness.  Extremities: Mild edema bilaterally with brawny changes decreased distal pulses  SKin: left arm warm and erythematous up to mid forearm  Pain to palpation hand and wrist  Erythema over dorsal aspect of arm   Musculoskeletal: No deformities  Neuro: no focal signs   Wt Readings from Last 3 Encounters:  09/16/14 81.103 kg (178 lb 12.8 oz)  08/13/14 82.8 kg (182 lb 8.7 oz)  07/23/14 86.3 kg (190 lb 4.1 oz)     EKG: Paced rhythm underlying atrial fibrillation

## 2014-09-16 ENCOUNTER — Emergency Department (HOSPITAL_COMMUNITY): Payer: Medicare Other

## 2014-09-16 ENCOUNTER — Inpatient Hospital Stay (HOSPITAL_COMMUNITY)
Admission: EM | Admit: 2014-09-16 | Discharge: 2014-09-19 | DRG: 640 | Disposition: A | Discharge status: Home / Self Care | Payer: Medicare Other | Attending: Internal Medicine | Admitting: Internal Medicine

## 2014-09-16 ENCOUNTER — Encounter: Payer: Self-pay | Admitting: Cardiovascular Disease

## 2014-09-16 ENCOUNTER — Encounter: Payer: Self-pay | Admitting: *Deleted

## 2014-09-16 ENCOUNTER — Encounter: Payer: Medicaid Other | Admitting: Internal Medicine

## 2014-09-16 ENCOUNTER — Encounter (HOSPITAL_COMMUNITY): Payer: Self-pay | Admitting: Emergency Medicine

## 2014-09-16 ENCOUNTER — Ambulatory Visit (INDEPENDENT_AMBULATORY_CARE_PROVIDER_SITE_OTHER): Payer: Medicare Other | Admitting: Cardiovascular Disease

## 2014-09-16 VITALS — BP 150/60 | HR 60 | Ht 67.0 in | Wt 178.8 lb

## 2014-09-16 DIAGNOSIS — L03011 Cellulitis of right finger: Secondary | ICD-10-CM

## 2014-09-16 DIAGNOSIS — E039 Hypothyroidism, unspecified: Secondary | ICD-10-CM | POA: Diagnosis present

## 2014-09-16 DIAGNOSIS — Z7951 Long term (current) use of inhaled steroids: Secondary | ICD-10-CM

## 2014-09-16 DIAGNOSIS — Z96652 Presence of left artificial knee joint: Secondary | ICD-10-CM | POA: Diagnosis present

## 2014-09-16 DIAGNOSIS — M7989 Other specified soft tissue disorders: Secondary | ICD-10-CM | POA: Diagnosis present

## 2014-09-16 DIAGNOSIS — I5032 Chronic diastolic (congestive) heart failure: Secondary | ICD-10-CM | POA: Diagnosis present

## 2014-09-16 DIAGNOSIS — N185 Chronic kidney disease, stage 5: Secondary | ICD-10-CM | POA: Diagnosis present

## 2014-09-16 DIAGNOSIS — Z9841 Cataract extraction status, right eye: Secondary | ICD-10-CM

## 2014-09-16 DIAGNOSIS — G43909 Migraine, unspecified, not intractable, without status migrainosus: Secondary | ICD-10-CM | POA: Diagnosis present

## 2014-09-16 DIAGNOSIS — I252 Old myocardial infarction: Secondary | ICD-10-CM

## 2014-09-16 DIAGNOSIS — F418 Other specified anxiety disorders: Secondary | ICD-10-CM | POA: Diagnosis present

## 2014-09-16 DIAGNOSIS — N189 Chronic kidney disease, unspecified: Secondary | ICD-10-CM

## 2014-09-16 DIAGNOSIS — L0390 Cellulitis, unspecified: Secondary | ICD-10-CM | POA: Insufficient documentation

## 2014-09-16 DIAGNOSIS — D509 Iron deficiency anemia, unspecified: Secondary | ICD-10-CM | POA: Diagnosis present

## 2014-09-16 DIAGNOSIS — Z66 Do not resuscitate: Secondary | ICD-10-CM | POA: Diagnosis present

## 2014-09-16 DIAGNOSIS — K259 Gastric ulcer, unspecified as acute or chronic, without hemorrhage or perforation: Secondary | ICD-10-CM | POA: Diagnosis present

## 2014-09-16 DIAGNOSIS — Z955 Presence of coronary angioplasty implant and graft: Secondary | ICD-10-CM

## 2014-09-16 DIAGNOSIS — E43 Unspecified severe protein-calorie malnutrition: Secondary | ICD-10-CM | POA: Diagnosis present

## 2014-09-16 DIAGNOSIS — Z9842 Cataract extraction status, left eye: Secondary | ICD-10-CM

## 2014-09-16 DIAGNOSIS — I120 Hypertensive chronic kidney disease with stage 5 chronic kidney disease or end stage renal disease: Secondary | ICD-10-CM | POA: Diagnosis present

## 2014-09-16 DIAGNOSIS — Z888 Allergy status to other drugs, medicaments and biological substances status: Secondary | ICD-10-CM

## 2014-09-16 DIAGNOSIS — Z961 Presence of intraocular lens: Secondary | ICD-10-CM | POA: Diagnosis present

## 2014-09-16 DIAGNOSIS — Z950 Presence of cardiac pacemaker: Secondary | ICD-10-CM | POA: Diagnosis present

## 2014-09-16 DIAGNOSIS — M109 Gout, unspecified: Secondary | ICD-10-CM | POA: Diagnosis present

## 2014-09-16 DIAGNOSIS — I10 Essential (primary) hypertension: Secondary | ICD-10-CM | POA: Diagnosis present

## 2014-09-16 DIAGNOSIS — Z9049 Acquired absence of other specified parts of digestive tract: Secondary | ICD-10-CM | POA: Diagnosis present

## 2014-09-16 DIAGNOSIS — Z90710 Acquired absence of both cervix and uterus: Secondary | ICD-10-CM

## 2014-09-16 DIAGNOSIS — Z87891 Personal history of nicotine dependence: Secondary | ICD-10-CM

## 2014-09-16 DIAGNOSIS — K219 Gastro-esophageal reflux disease without esophagitis: Secondary | ICD-10-CM | POA: Diagnosis present

## 2014-09-16 DIAGNOSIS — M1990 Unspecified osteoarthritis, unspecified site: Secondary | ICD-10-CM | POA: Diagnosis present

## 2014-09-16 DIAGNOSIS — Z7901 Long term (current) use of anticoagulants: Secondary | ICD-10-CM

## 2014-09-16 DIAGNOSIS — E875 Hyperkalemia: Principal | ICD-10-CM | POA: Diagnosis present

## 2014-09-16 DIAGNOSIS — I50812 Chronic right heart failure: Secondary | ICD-10-CM

## 2014-09-16 DIAGNOSIS — R791 Abnormal coagulation profile: Secondary | ICD-10-CM | POA: Diagnosis present

## 2014-09-16 DIAGNOSIS — K922 Gastrointestinal hemorrhage, unspecified: Secondary | ICD-10-CM | POA: Diagnosis present

## 2014-09-16 DIAGNOSIS — I2510 Atherosclerotic heart disease of native coronary artery without angina pectoris: Secondary | ICD-10-CM | POA: Diagnosis present

## 2014-09-16 DIAGNOSIS — Z6828 Body mass index (BMI) 28.0-28.9, adult: Secondary | ICD-10-CM

## 2014-09-16 DIAGNOSIS — I509 Heart failure, unspecified: Secondary | ICD-10-CM

## 2014-09-16 DIAGNOSIS — E038 Other specified hypothyroidism: Secondary | ICD-10-CM | POA: Diagnosis present

## 2014-09-16 DIAGNOSIS — I4892 Unspecified atrial flutter: Secondary | ICD-10-CM | POA: Diagnosis present

## 2014-09-16 DIAGNOSIS — E785 Hyperlipidemia, unspecified: Secondary | ICD-10-CM | POA: Diagnosis present

## 2014-09-16 DIAGNOSIS — D500 Iron deficiency anemia secondary to blood loss (chronic): Secondary | ICD-10-CM

## 2014-09-16 DIAGNOSIS — I4891 Unspecified atrial fibrillation: Secondary | ICD-10-CM | POA: Diagnosis present

## 2014-09-16 DIAGNOSIS — N184 Chronic kidney disease, stage 4 (severe): Secondary | ICD-10-CM | POA: Diagnosis present

## 2014-09-16 DIAGNOSIS — D649 Anemia, unspecified: Secondary | ICD-10-CM | POA: Diagnosis present

## 2014-09-16 DIAGNOSIS — I739 Peripheral vascular disease, unspecified: Secondary | ICD-10-CM | POA: Diagnosis present

## 2014-09-16 LAB — BASIC METABOLIC PANEL
ANION GAP: 7 (ref 5–15)
Anion gap: 7 (ref 5–15)
BUN: 36 mg/dL — ABNORMAL HIGH (ref 6–23)
BUN: 37 mg/dL — ABNORMAL HIGH (ref 6–23)
CHLORIDE: 104 mmol/L (ref 96–112)
CO2: 19 mmol/L (ref 19–32)
CO2: 19 mmol/L (ref 19–32)
Calcium: 8.9 mg/dL (ref 8.4–10.5)
Calcium: 8.6 mg/dL (ref 8.4–10.5)
Chloride: 104 mmol/L (ref 96–112)
Creatinine, Ser: 2.96 mg/dL — ABNORMAL HIGH (ref 0.50–1.10)
Creatinine, Ser: 2.97 mg/dL — ABNORMAL HIGH (ref 0.50–1.10)
GFR calc Af Amer: 17 mL/min — ABNORMAL LOW (ref >90)
GFR calc non Af Amer: 14 mL/min — ABNORMAL LOW (ref >90)
GFR calc non Af Amer: 14 mL/min — ABNORMAL LOW (ref >90)
GFR, EST AFRICAN AMERICAN: 16 mL/min — AB (ref >90)
Glucose, Bld: 108 mg/dL — ABNORMAL HIGH (ref 70–99)
Glucose, Bld: 100 mg/dL — ABNORMAL HIGH (ref 70–99)
Potassium: 7.4 mmol/L (ref 3.5–5.1)
SODIUM: 130 mmol/L — AB (ref 135–145)
SODIUM: 130 mmol/L — AB (ref 135–145)

## 2014-09-16 LAB — CBC WITH DIFFERENTIAL/PLATELET
BASOS ABS: 0 10*3/uL (ref 0.0–0.1)
Basophils Relative: 1 % (ref 0–1)
EOS ABS: 0.2 10*3/uL (ref 0.0–0.7)
Eosinophils Relative: 4 % (ref 0–5)
HCT: 32.2 % — ABNORMAL LOW (ref 36.0–46.0)
Hemoglobin: 10.6 g/dL — ABNORMAL LOW (ref 12.0–15.0)
LYMPHS ABS: 0.4 10*3/uL — AB (ref 0.7–4.0)
Lymphocytes Relative: 10 % — ABNORMAL LOW (ref 12–46)
MCH: 29 pg (ref 26.0–34.0)
MCHC: 32.9 g/dL (ref 30.0–36.0)
MCV: 88.2 fL (ref 78.0–100.0)
MONOS PCT: 8 % (ref 3–12)
Monocytes Absolute: 0.4 10*3/uL (ref 0.1–1.0)
Neutro Abs: 3.3 10*3/uL (ref 1.7–7.7)
Neutrophils Relative %: 77 % (ref 43–77)
Platelets: 199 10*3/uL (ref 150–400)
RBC: 3.65 MIL/uL — ABNORMAL LOW (ref 3.87–5.11)
RDW: 16.2 % — ABNORMAL HIGH (ref 11.5–15.5)
WBC: 4.3 10*3/uL (ref 4.0–10.5)

## 2014-09-16 LAB — PROTIME-INR
INR: 5.32 — AB (ref 0.00–1.49)
Prothrombin Time: 48.4 seconds — ABNORMAL HIGH (ref 11.6–15.2)

## 2014-09-16 MED ORDER — OXYCODONE-ACETAMINOPHEN 5-325 MG PO TABS
1.0000 | ORAL_TABLET | Freq: Once | ORAL | Status: AC
  Administered 2014-09-16: 1 via ORAL
  Filled 2014-09-16: qty 1

## 2014-09-16 MED ORDER — ALBUTEROL SULFATE (2.5 MG/3ML) 0.083% IN NEBU
10.0000 mg | INHALATION_SOLUTION | Freq: Once | RESPIRATORY_TRACT | Status: AC
  Administered 2014-09-16: 10 mg via RESPIRATORY_TRACT
  Filled 2014-09-16: qty 12

## 2014-09-16 MED ORDER — SODIUM POLYSTYRENE SULFONATE 15 GM/60ML PO SUSP
30.0000 g | Freq: Once | ORAL | Status: AC
  Administered 2014-09-16: 30 g via ORAL
  Filled 2014-09-16: qty 120

## 2014-09-16 MED ORDER — ALBUTEROL (5 MG/ML) CONTINUOUS INHALATION SOLN
INHALATION_SOLUTION | RESPIRATORY_TRACT | Status: AC
  Filled 2014-09-16: qty 20

## 2014-09-16 MED ORDER — INSULIN ASPART 100 UNIT/ML IV SOLN
5.0000 [IU] | Freq: Once | INTRAVENOUS | Status: AC
  Administered 2014-09-16: 5 [IU] via INTRAVENOUS
  Filled 2014-09-16: qty 1

## 2014-09-16 MED ORDER — DEXTROSE 50 % IV SOLN
1.0000 | Freq: Once | INTRAVENOUS | Status: AC
  Administered 2014-09-16: 50 mL via INTRAVENOUS
  Filled 2014-09-16: qty 50

## 2014-09-16 MED ORDER — SODIUM CHLORIDE 0.9 % IV SOLN
1.0000 g | Freq: Once | INTRAVENOUS | Status: AC
  Administered 2014-09-16: 1 g via INTRAVENOUS
  Filled 2014-09-16: qty 10

## 2014-09-16 NOTE — Assessment & Plan Note (Signed)
Called triage nurse at hospital Son will take her to ER  Needs xray of hand and probably antibiotics  Not clear if this may be from iv in hospital May need hospitalization and inpatient stay given comorbidities Have called Dr Tamsen Roers office in [place] and she has f/u there on Thursday

## 2014-09-16 NOTE — ED Notes (Signed)
Called respiratory for nebulizer.

## 2014-09-16 NOTE — Assessment & Plan Note (Signed)
Normal function reviewed last PACEART  Cancelled appt with Dr Forde Dandy office today as she needs to go to ER  Concern for early Rx of cellulitis given hardware in heart

## 2014-09-16 NOTE — ED Notes (Signed)
Requested pain medication, Dr. Tawnya Crook gives verbal order for percocet for pain management.

## 2014-09-16 NOTE — ED Provider Notes (Signed)
CSN: [id number]     Arrival date & time 09/16/14  1637 History   First MD Initiated Contact with Patient 09/16/14 1908     Chief Complaint  Patient presents with  . Arm Pain     (Consider location/radiation/quality/duration/timing/severity/associated sxs/prior Treatment) HPI Comments: Pt send from PCP's office for eval of 6 days of painful swelling to L hand/arm.   Patient is a 80 y.o. female presenting with hand pain. The history is provided by the patient. No language interpreter was used.  Hand Pain This is a new problem. The current episode started more than 2 days ago. The problem occurs constantly. The problem has been gradually worsening. Pertinent negatives include no chest pain, no abdominal pain, no headaches and no shortness of breath. Exacerbated by: bearing weight. The symptoms are relieved by rest. She has tried nothing for the symptoms. The treatment provided no relief.    Past Medical History  Diagnosis Date  . Cellulitis and abscess of unspecified site     a. h/o MSRA cellulitis of abdominal wall.  Marland Kitchen CAD (coronary artery disease)     a. DES to OM 01/2005. b. Rotational atherotomy/DES to prox LAD 02/2008. c. Cath 07/2013: stable, for med rx.  . Edema   . Warfarin anticoagulation     a. Followed by PCP.  Marland Kitchen GI bleed     a. EGD 01/2007: antral ulcers related to NSAID/aspirin use.  . Peripheral vascular disease   . Hyperthyroidism     a. Hx of intolerance to PTU.  Marland Kitchen Blood transfusion years ago    "heart problems" (09/26/2013)  . H/O hiatal hernia   . Umbilical hernia     unrepaired (11/24/11)  . Valvular heart disease     Echo 11/2011: mild-mod MR.  Marland Kitchen Hypertension     takes Amlodipine,Hydralazine,and Imdur daily  . Hyperlipidemia     takes Lovastatin daily  . SSS (sick sinus syndrome)     a. H/o AV node ablation 2008 secondary to difficult to control afib/flutter, with subsequent pacer.  . Atrial fibrillation with rapid ventricular response     a. H/o amiodarone  thyroiditis. b. s/p AV node ablation 2008 with implantation of PPM 01/2007, upgraded to CRT-P 06/2007.;takes Coumadin daily  . GERD (gastroesophageal reflux disease)     takes Protonix daily  . Chronic diastolic CHF (congestive heart failure)     takes Lasix daily and Spironolactone   . Pacemaker     [employer]onic  . History of gout   . Hemorrhoids   . Depression with anxiety     takes Xanax daily  . Insomnia     takes Xanax if needed  . Heart murmur   . CKD (chronic kidney disease)     Cr ~2.3.  . Kidney stones   . Myocardial infarction     "I've had 2-3; last one was in /1996" (09/26/2013)  . Migraines     "last one was a pretty good while ago" (09/26/2013)  . Arthritis     "all over" (09/26/2013)  . Chronic lower back pain     deteriorating disc  . Iron deficiency anemia     takes an Iron Pill daily  . Pneumonia    Past Surgical History  Procedure Laterality Date  . Esophagogastroduodenoscopy    . Tonsillectomy  ~ 1960  . Laminectomy and microdiscectomy lumbar spine  08/2003  . Cardioversion  10/2004; 02/2007    /E-chart  . Cholecystectomy    . Appendectomy  1954  .  Vaginal hysterectomy  ?1981  . Av node ablation  02/2007    /E-chart  . Insert / replace / remove pacemaker  01/2007    initial placement  . Insert / replace / remove pacemaker  06/2007    upgraded/E-chart  . Av fistula placement, brachiocephalic Right 09/6376    "didn't work; my veins were too small"  . Joint replacement    . Total knee arthroplasty Left 01/2009  . Bladder and bowel  1970's    "tacked; damaged my intestines & had to remove some; done @ [facility]  . Cataract extraction w/ intraocular lens  implant, bilateral Bilateral   . Bascilic vein transposition Right 06/21/2013    Procedure: [place];  Surgeon: Serafina Mitchell, MD;  Location: [place] OR;  Service: Vascular;  Laterality: Right;  . Esophagogastroduodenoscopy N/A 07/19/2013    Procedure: ESOPHAGOGASTRODUODENOSCOPY  (EGD);  Surgeon: Lafayette Dragon, MD;  Location: [facility] ENDOSCOPY;  Service: Endoscopy;  Laterality: N/A;  . Balloon dilation N/A 07/19/2013    Procedure: BALLOON DILATION;  Surgeon: Lafayette Dragon, MD;  Location: [facility] ENDOSCOPY;  Service: Endoscopy;  Laterality: N/A;  . Esophagogastroduodenoscopy N/A 07/20/2013    Procedure: ESOPHAGOGASTRODUODENOSCOPY (EGD);  Surgeon: Lafayette Dragon, MD;  Location: [facility] ENDOSCOPY;  Service: Endoscopy;  Laterality: N/A;  with removal of polyp in pylorus  . Bascilic vein transposition Right 09/20/2013    Procedure: 2ND STAGE RIGHT BASCILIC VEIN TRANSPOSITION; ULTRSOUND GUIDED;  Surgeon: Serafina Mitchell, MD;  Location: [facility] OR;  Service: Vascular;  Laterality: Right;  . Cardiac catheterization  2006; 2008; 2009; 01/13/2011  . Coronary angioplasty with stent placement  ~ 2007; ?    "had a total of 2 stents put in" 92/07/2014)  . Back surgery    . Left heart catheterization with coronary angiogram N/A 07/15/2013    Procedure: LEFT HEART CATHETERIZATION WITH CORONARY ANGIOGRAM;  Surgeon: Blane Ohara, MD;  Location: [facility] CATH LAB;  Service: Cardiovascular;  Laterality: N/A;  . Esophagogastroduodenoscopy N/A 07/25/2014    Procedure: ESOPHAGOGASTRODUODENOSCOPY (EGD);  Surgeon: Irene Shipper, MD;  Location: [facility] ENDOSCOPY;  Service: Endoscopy;  Laterality: N/A;  . Esophagogastroduodenoscopy N/A 08/09/2014    Procedure: ESOPHAGOGASTRODUODENOSCOPY (EGD);  Surgeon: Inda Castle, MD;  Location: [facility] ENDOSCOPY;  Service: Endoscopy;  Laterality: N/A;  Possible enteroscopy  . Givens capsule study N/A 08/11/2014    Procedure: GIVENS CAPSULE STUDY;  Surgeon: Inda Castle, MD;  Location: [facility] ENDOSCOPY;  Service: Endoscopy;  Laterality: N/A;  . Colonoscopy  last 2011   Family History  Problem Relation Age of Onset  . Colon cancer Neg Hx   . Thyroid disease Neg Hx   . Stroke Brother   . Hypertension Brother   . Kidney disease Brother   . Kidney disease Brother   . Other Mother     Sepsis  and perforated colon  . Kidney disease Mother   . Other Father     Motor vehicle accident  . Learning disabilities Son   . Kidney disease Maternal Aunt   . Kidney disease Maternal Grandmother    History  Substance Use Topics  . Smoking status: Former Smoker -- 1.00 packs/day for 50 years    Types: Cigarettes    Quit date: 07/13/1995  . Smokeless tobacco: Never Used     Comment: quit smoking around 1996  . Alcohol Use: No   OB History    No data available     Review of Systems  Constitutional:  Positive for chills. Negative for fever, diaphoresis, activity change, appetite change and fatigue.  HENT: Negative for congestion, facial swelling, rhinorrhea and sore throat.   Eyes: Negative for photophobia and discharge.  Respiratory: Negative for cough, chest tightness and shortness of breath.   Cardiovascular: Negative for chest pain, palpitations and leg swelling.  Gastrointestinal: Negative for nausea, vomiting, abdominal pain and diarrhea.  Endocrine: Negative for polydipsia and polyuria.  Genitourinary: Negative for dysuria, frequency, difficulty urinating and pelvic pain.  Musculoskeletal: Negative for back pain, arthralgias, neck pain and neck stiffness.  Skin: Negative for color change and wound.  Allergic/Immunologic: Negative for immunocompromised state.  Neurological: Negative for facial asymmetry, weakness, numbness and headaches.  Hematological: Does not bruise/bleed easily.  Psychiatric/Behavioral: Negative for confusion and agitation.      Allergies  Amiodarone and Propylthiouracil  Home Medications   Prior to Admission medications   Medication Sig Start Date End Date Taking? Authorizing Provider  ALPRAZolam Duanne Moron) 0.5 MG tablet Take 1 tablet (0.5 mg total) by mouth at bedtime as needed for anxiety or sleep. 08/13/14  Yes Bonnielee Haff, MD  colchicine 0.6 MG tablet Take 1 tablet (0.6 mg total) by mouth 2 (two) times daily as needed (Gout). 08/13/14  Yes Bonnielee Haff, MD  ergocalciferol (VITAMIN D2) 50000 UNITS capsule Take 1 capsule (50,000 Units total) by mouth every Saturday. Take on saturdays 08/13/14  Yes Bonnielee Haff, MD  Fluticasone-Salmeterol (ADVAIR DISKUS) 100-50 MCG/DOSE AEPB Inhale 1 puff into the lungs 2 (two) times daily. 08/13/14  Yes Bonnielee Haff, MD  furosemide (LASIX) 80 MG tablet Take 1 tablet (80 mg total) by mouth 2 (two) times daily. 08/13/14  Yes Bonnielee Haff, MD  hydrALAZINE (APRESOLINE) 25 MG tablet Take 1 tablet (25 mg total) by mouth 3 (three) times daily. 08/13/14  Yes Bonnielee Haff, MD  isosorbide mononitrate (IMDUR) 60 MG 24 hr tablet Take 2 tablets (120 mg total) by mouth daily. 08/13/14  Yes Bonnielee Haff, MD  loratadine (CLARITIN) 10 MG tablet Take 1 tablet (10 mg total) by mouth daily. 08/13/14  Yes Bonnielee Haff, MD  lovastatin (MEVACOR) 10 MG tablet Take 1 tablet (10 mg total) by mouth at bedtime. 08/13/14  Yes Bonnielee Haff, MD  magnesium oxide (MAG-OX) 400 MG tablet Take 1 tablet (400 mg total) by mouth at bedtime. 08/13/14  Yes Bonnielee Haff, MD  metolazone (ZAROXOLYN) 2.5 MG tablet Take 1 tablet (2.5 mg total) by mouth 2 (two) times a week. Monday and Friday 08/13/14  Yes Bonnielee Haff, MD  nitroGLYCERIN (NITROSTAT) 0.4 MG SL tablet Place 1 tablet (0.4 mg total) under the tongue every 5 (five) minutes as needed for chest pain. 08/13/14  Yes Bonnielee Haff, MD  ondansetron (ZOFRAN) 4 MG tablet Take 1 tablet (4 mg total) by mouth every 8 (eight) hours as needed for nausea or vomiting. 08/13/14  Yes Bonnielee Haff, MD  oxyCODONE-acetaminophen (PERCOCET/ROXICET) 5-325 MG per tablet Take 1 tablet by mouth every 6 (six) hours as needed for severe pain. 08/13/14  Yes Bonnielee Haff, MD  pantoprazole (PROTONIX) 40 MG tablet Take 1 tablet (40 mg total) by mouth 2 (two) times daily. 08/13/14  Yes Bonnielee Haff, MD  potassium chloride SA (K-DUR,KLOR-CON) 20 MEQ tablet Take 2 tablets (40 mEq total) by mouth 2 (two)  times daily. 08/13/14  Yes Bonnielee Haff, MD  warfarin (COUMADIN) 1 MG tablet Take 1 tablet (1 mg total) by mouth daily at 6 PM. 08/13/14  Yes Bonnielee Haff, MD  camphor-menthol [facility]) lotion Apply topically  as needed for itching. Patient taking differently: Apply 1 application topically daily as needed for itching.  05/12/14   Barton Dubois, MD  levothyroxine (SYNTHROID, LEVOTHROID) 100 MCG tablet Take 1 tablet (100 mcg total) by mouth daily before breakfast. Patient not taking: Reported on 09/16/2014 08/13/14   Bonnielee Haff, MD  promethazine (PHENERGAN) 25 MG tablet Take 1 tablet (25 mg total) by mouth every 8 (eight) hours as needed for nausea or vomiting. Patient not taking: Reported on 09/16/2014 08/13/14   Bonnielee Haff, MD   BP 143/53 mmHg  Pulse 59  Temp(Src) 97.7 F (36.5 C) (Oral)  Resp 21  SpO2 100% Physical Exam  Constitutional: She is oriented to person, place, and time. She appears well-developed and well-nourished. No distress.  HENT:  Head: Normocephalic and atraumatic.  Mouth/Throat: No oropharyngeal exudate.  Eyes: Pupils are equal, round, and reactive to light.  Neck: Normal range of motion. Neck supple.  Cardiovascular: Normal rate, regular rhythm and normal heart sounds.  Exam reveals no gallop and no friction rub.   No murmur heard. Pulmonary/Chest: Effort normal and breath sounds normal. No respiratory distress. She has no wheezes. She has no rales.  Abdominal: Soft. Bowel sounds are normal. She exhibits no distension and no mass. There is no tenderness. There is no rebound and no guarding.  Musculoskeletal: Normal range of motion. She exhibits no edema (significant pitting edema to R hand/arm with mild erythema to arm w/o clearly demarcated edges. Nml radial/ulnar pulse, motor, & sensation) or tenderness.  Neurological: She is alert and oriented to person, place, and time.  Skin: Skin is warm and dry.  Psychiatric: She has a normal mood and affect.    ED  Course  Procedures (including critical care time) Labs Review Labs Reviewed  CBC WITH DIFFERENTIAL/PLATELET - Abnormal; Notable for the following:    RBC 3.65 (*)    Hemoglobin 10.6 (*)    HCT 32.2 (*)    RDW 16.2 (*)    Lymphocytes Relative 10 (*)    Lymphs Abs 0.4 (*)    All other components within normal limits  BASIC METABOLIC PANEL - Abnormal; Notable for the following:    Sodium 130 (*)    Potassium >7.5 (*)    Glucose, Bld 108 (*)    BUN 36 (*)    Creatinine, Ser 2.96 (*)    GFR calc non Af Amer 14 (*)    GFR calc Af Amer 17 (*)    All other components within normal limits  PROTIME-INR - Abnormal; Notable for the following:    Prothrombin Time 48.4 (*)    INR 5.32 (*)    All other components within normal limits  BASIC METABOLIC PANEL - Abnormal; Notable for the following:    Sodium 130 (*)    Potassium 7.4 (*)    Glucose, Bld 100 (*)    BUN 37 (*)    Creatinine, Ser 2.97 (*)    GFR calc non Af Amer 14 (*)    GFR calc Af Amer 16 (*)    All other components within normal limits  GLUCOSE, RANDOM  BASIC METABOLIC PANEL    Imaging Review Dg Hand Complete Left  09/16/2014   CLINICAL DATA:  Left hand pain and swelling, along the dorsum of the hand. Initial encounter.  EXAM: LEFT HAND - COMPLETE 3+ VIEW  COMPARISON:  None.  FINDINGS: There is no definite evidence of fracture or dislocation. Multiple erosions are noted at the carpal rows, relatively focal along  the mid and distal scaphoid. Degenerative change is noted at the first carpometacarpal joint, with chronic subluxation an underlying osteophyte formation. There is calcification of the triangular fibrocartilage.  Diffuse dorsal soft tissue swelling is noted, without evidence of associated fracture. Mild chronic deformity is noted at the base of the fifth metacarpal, likely reflecting remote injury.  IMPRESSION: 1. No definite evidence of fracture or dislocation. 2. Multiple erosions of the carpal rows, reflecting  underlying erosive arthropathy. 3. Degenerative change at the first carpometacarpal joint likely reflects osteoarthritis. 4. Calcification of the triangular fibrocartilage. 5. Diffuse dorsal soft tissue swelling noted.   Electronically Signed   By: Garald Balding M.D.   On: 09/16/2014 21:26     EKG Interpretation   Date/Time:  Tuesday September 16 2014 23:02:24 EST Ventricular Rate:  60 PR Interval:  207 QRS Duration: 110 QT Interval:  420 QTC Calculation: 420 R Axis:   -115 Text Interpretation:  Atrial fib/flutter Probable left atrial enlargement  Anterolateral infarct, age indeterminate Confirmed by Mylea Roarty  MD, Aslan Montagna  [phone]) on 09/16/2014 11:18:31 PM      MDM   Final diagnoses:  Hyperkalemia  Left arm swelling  CKD (chronic kidney disease), unspecified stage    Pt is a 80 y.o. female with Pmhx as above who presents with pain/swelling of L hand arm for 6 days. She has had no known injury, believes she had and IV in that arm while in the hospital several weeks ago. She denies fever, CP, SOB, numbness weakness, though does have pain w/ weight bearing. She has significant pitting edema over the L arm/hand with minimal erythema w/o distinct borders. PMS intact distally. WBC nml.  She has minimal BLLE edema, no R arm edema. She has no s/sx of PE, and INR supratheraputic making DVT unlikely. Pt found to have incidentally elevated hyperkalemia associated with her chronic kidney disease.  She has no hyperacute T waves, QRS complexes are mildly widened but unchanged from prior.  Calcium gluconate, albuterol, insulin, dextrose, Kayexalate given.  Tried consulted and will admit to stepdown.  They have requested consult to nephrology.  Retired  12:45 AM I spoke w/ Dr. Clydene Laming, who in addition to hyperK meds already given rec 3 amps of d5W and recheck in 1 hr, can call with results. HD will likely be started tomorrow.       Ernestina Patches, MD 09/17/14 [phone]

## 2014-09-16 NOTE — ED Notes (Signed)
INR 5.32 called in by lab.

## 2014-09-16 NOTE — ED Notes (Signed)
Main lab called to report hemolyed K+ level of over 7.5.

## 2014-09-16 NOTE — Assessment & Plan Note (Addendum)
Concerning given need for anticoagulation.  No source found possible Dieaulafoy's lesion.  F/U GI and primary for f/u blood work

## 2014-09-16 NOTE — Assessment & Plan Note (Signed)
Chronic LE edema continue current diuretic  Overall her clinical health is deteriorating quite a bit

## 2014-09-16 NOTE — ED Notes (Signed)
Reported repeat K+ level result of 7.4 not hemolyzed to Dr. Tawnya Crook. She acknowledges.

## 2014-09-16 NOTE — ED Notes (Signed)
cbg is 96 with poc

## 2014-09-16 NOTE — Assessment & Plan Note (Signed)
09/12/14  INR 2.2  Back on coumadin may need to stop again if anemia worsens or bleeds again given no source found

## 2014-09-16 NOTE — ED Notes (Signed)
Spoke to Dr. Tawnya Crook to report high K+ level of over 7.5, she acknowledges, orders repeat blood draw.

## 2014-09-16 NOTE — ED Notes (Signed)
Pt c/o left arm pain and swelling x 4 days; pt sent here for eval; CNS intact

## 2014-09-16 NOTE — ED Notes (Signed)
Spoke to Dr. Tawnya Crook in regards to patient plan of care. INR level 5.32, and the patient voided small amount.  She acknwowedges, and reviewes urinary symptoms, so no urine collection needed.

## 2014-09-16 NOTE — Patient Instructions (Signed)
Your physician recommends that you schedule a follow-up appointment in:  3 MONTHS WITH  DR NISHAN  Your physician recommends that you continue on your current medications as directed. Please refer to the Current Medication list given to you today.  

## 2014-09-17 ENCOUNTER — Encounter (HOSPITAL_COMMUNITY): Payer: Self-pay | Admitting: *Deleted

## 2014-09-17 DIAGNOSIS — Z87891 Personal history of nicotine dependence: Secondary | ICD-10-CM | POA: Diagnosis not present

## 2014-09-17 DIAGNOSIS — Z7901 Long term (current) use of anticoagulants: Secondary | ICD-10-CM | POA: Diagnosis not present

## 2014-09-17 DIAGNOSIS — Z96652 Presence of left artificial knee joint: Secondary | ICD-10-CM | POA: Diagnosis present

## 2014-09-17 DIAGNOSIS — K257 Chronic gastric ulcer without hemorrhage or perforation: Secondary | ICD-10-CM

## 2014-09-17 DIAGNOSIS — I2510 Atherosclerotic heart disease of native coronary artery without angina pectoris: Secondary | ICD-10-CM | POA: Diagnosis present

## 2014-09-17 DIAGNOSIS — E038 Other specified hypothyroidism: Secondary | ICD-10-CM

## 2014-09-17 DIAGNOSIS — E875 Hyperkalemia: Principal | ICD-10-CM

## 2014-09-17 DIAGNOSIS — Z9049 Acquired absence of other specified parts of digestive tract: Secondary | ICD-10-CM | POA: Diagnosis present

## 2014-09-17 DIAGNOSIS — I5032 Chronic diastolic (congestive) heart failure: Secondary | ICD-10-CM | POA: Diagnosis present

## 2014-09-17 DIAGNOSIS — I4891 Unspecified atrial fibrillation: Secondary | ICD-10-CM | POA: Diagnosis present

## 2014-09-17 DIAGNOSIS — D500 Iron deficiency anemia secondary to blood loss (chronic): Secondary | ICD-10-CM

## 2014-09-17 DIAGNOSIS — I4892 Unspecified atrial flutter: Secondary | ICD-10-CM | POA: Diagnosis present

## 2014-09-17 DIAGNOSIS — F418 Other specified anxiety disorders: Secondary | ICD-10-CM | POA: Diagnosis present

## 2014-09-17 DIAGNOSIS — E039 Hypothyroidism, unspecified: Secondary | ICD-10-CM | POA: Diagnosis present

## 2014-09-17 DIAGNOSIS — M109 Gout, unspecified: Secondary | ICD-10-CM | POA: Diagnosis present

## 2014-09-17 DIAGNOSIS — R791 Abnormal coagulation profile: Secondary | ICD-10-CM

## 2014-09-17 DIAGNOSIS — I739 Peripheral vascular disease, unspecified: Secondary | ICD-10-CM | POA: Diagnosis present

## 2014-09-17 DIAGNOSIS — K219 Gastro-esophageal reflux disease without esophagitis: Secondary | ICD-10-CM | POA: Diagnosis present

## 2014-09-17 DIAGNOSIS — E43 Unspecified severe protein-calorie malnutrition: Secondary | ICD-10-CM | POA: Diagnosis present

## 2014-09-17 DIAGNOSIS — K922 Gastrointestinal hemorrhage, unspecified: Secondary | ICD-10-CM | POA: Diagnosis present

## 2014-09-17 DIAGNOSIS — E785 Hyperlipidemia, unspecified: Secondary | ICD-10-CM | POA: Diagnosis present

## 2014-09-17 DIAGNOSIS — Z6828 Body mass index (BMI) 28.0-28.9, adult: Secondary | ICD-10-CM | POA: Diagnosis not present

## 2014-09-17 DIAGNOSIS — Z90710 Acquired absence of both cervix and uterus: Secondary | ICD-10-CM | POA: Diagnosis not present

## 2014-09-17 DIAGNOSIS — Z888 Allergy status to other drugs, medicaments and biological substances status: Secondary | ICD-10-CM | POA: Diagnosis not present

## 2014-09-17 DIAGNOSIS — I120 Hypertensive chronic kidney disease with stage 5 chronic kidney disease or end stage renal disease: Secondary | ICD-10-CM | POA: Diagnosis present

## 2014-09-17 DIAGNOSIS — Z9841 Cataract extraction status, right eye: Secondary | ICD-10-CM | POA: Diagnosis not present

## 2014-09-17 DIAGNOSIS — I10 Essential (primary) hypertension: Secondary | ICD-10-CM

## 2014-09-17 DIAGNOSIS — Z7951 Long term (current) use of inhaled steroids: Secondary | ICD-10-CM | POA: Diagnosis not present

## 2014-09-17 DIAGNOSIS — Z961 Presence of intraocular lens: Secondary | ICD-10-CM | POA: Diagnosis present

## 2014-09-17 DIAGNOSIS — I252 Old myocardial infarction: Secondary | ICD-10-CM | POA: Diagnosis not present

## 2014-09-17 DIAGNOSIS — Z9842 Cataract extraction status, left eye: Secondary | ICD-10-CM | POA: Diagnosis not present

## 2014-09-17 DIAGNOSIS — Z955 Presence of coronary angioplasty implant and graft: Secondary | ICD-10-CM | POA: Diagnosis not present

## 2014-09-17 DIAGNOSIS — Z950 Presence of cardiac pacemaker: Secondary | ICD-10-CM

## 2014-09-17 DIAGNOSIS — Z66 Do not resuscitate: Secondary | ICD-10-CM | POA: Diagnosis present

## 2014-09-17 DIAGNOSIS — D509 Iron deficiency anemia, unspecified: Secondary | ICD-10-CM | POA: Diagnosis present

## 2014-09-17 DIAGNOSIS — M7989 Other specified soft tissue disorders: Secondary | ICD-10-CM | POA: Diagnosis present

## 2014-09-17 DIAGNOSIS — G43909 Migraine, unspecified, not intractable, without status migrainosus: Secondary | ICD-10-CM | POA: Diagnosis present

## 2014-09-17 DIAGNOSIS — N184 Chronic kidney disease, stage 4 (severe): Secondary | ICD-10-CM

## 2014-09-17 DIAGNOSIS — N185 Chronic kidney disease, stage 5: Secondary | ICD-10-CM | POA: Diagnosis present

## 2014-09-17 DIAGNOSIS — M1990 Unspecified osteoarthritis, unspecified site: Secondary | ICD-10-CM | POA: Diagnosis present

## 2014-09-17 DIAGNOSIS — M10042 Idiopathic gout, left hand: Secondary | ICD-10-CM

## 2014-09-17 LAB — BASIC METABOLIC PANEL
ANION GAP: 7 (ref 5–15)
Anion gap: 8 (ref 5–15)
BUN: 36 mg/dL — ABNORMAL HIGH (ref 6–23)
BUN: 39 mg/dL — ABNORMAL HIGH (ref 6–23)
CALCIUM: 8.5 mg/dL (ref 8.4–10.5)
CALCIUM: 9 mg/dL (ref 8.4–10.5)
CO2: 17 mmol/L — ABNORMAL LOW (ref 19–32)
CO2: 19 mmol/L (ref 19–32)
CREATININE: 3.14 mg/dL — AB (ref 0.50–1.10)
CREATININE: 3.19 mg/dL — AB (ref 0.50–1.10)
Chloride: 105 mmol/L (ref 96–112)
Chloride: 108 mmol/L (ref 96–112)
GFR calc Af Amer: 15 mL/min — ABNORMAL LOW (ref >90)
GFR calc Af Amer: 15 mL/min — ABNORMAL LOW (ref >90)
GFR calc non Af Amer: 13 mL/min — ABNORMAL LOW (ref >90)
GFR calc non Af Amer: 13 mL/min — ABNORMAL LOW (ref >90)
GLUCOSE: 89 mg/dL (ref 70–99)
Glucose, Bld: 112 mg/dL — ABNORMAL HIGH (ref 70–99)
POTASSIUM: 6.7 mmol/L — AB (ref 3.5–5.1)
Sodium: 131 mmol/L — ABNORMAL LOW (ref 135–145)
Sodium: 133 mmol/L — ABNORMAL LOW (ref 135–145)

## 2014-09-17 LAB — POCT I-STAT, CHEM 8
BUN: 24 mg/dL — AB (ref 6–23)
CALCIUM ION: 1.28 mmol/L (ref 1.13–1.30)
CHLORIDE: 101 mmol/L (ref 96–112)
Creatinine, Ser: 2.1 mg/dL — ABNORMAL HIGH (ref 0.50–1.10)
GLUCOSE: 90 mg/dL (ref 70–99)
HCT: 30 % — ABNORMAL LOW (ref 36.0–46.0)
HEMOGLOBIN: 10.2 g/dL — AB (ref 12.0–15.0)
Potassium: 4.2 mmol/L (ref 3.5–5.1)
Sodium: 137 mmol/L (ref 135–145)
TCO2: 17 mmol/L (ref 0–100)

## 2014-09-17 LAB — PROTIME-INR
INR: 5.99 (ref 0.00–1.49)
Prothrombin Time: 53.9 seconds — ABNORMAL HIGH (ref 11.6–15.2)

## 2014-09-17 LAB — RENAL FUNCTION PANEL
Albumin: 2.3 g/dL — ABNORMAL LOW (ref 3.5–5.2)
Anion gap: 8 (ref 5–15)
BUN: 35 mg/dL — ABNORMAL HIGH (ref 6–23)
CALCIUM: 8.7 mg/dL (ref 8.4–10.5)
CO2: 20 mmol/L (ref 19–32)
Chloride: 103 mmol/L (ref 96–112)
Creatinine, Ser: 3.16 mg/dL — ABNORMAL HIGH (ref 0.50–1.10)
GFR calc Af Amer: 15 mL/min — ABNORMAL LOW (ref >90)
GFR calc non Af Amer: 13 mL/min — ABNORMAL LOW (ref >90)
GLUCOSE: 164 mg/dL — AB (ref 70–99)
Phosphorus: 2.9 mg/dL (ref 2.3–4.6)
Potassium: 5.5 mmol/L — ABNORMAL HIGH (ref 3.5–5.1)
Sodium: 131 mmol/L — ABNORMAL LOW (ref 135–145)

## 2014-09-17 LAB — CBC WITH DIFFERENTIAL/PLATELET
BASOS ABS: 0 10*3/uL (ref 0.0–0.1)
Basophils Relative: 1 % (ref 0–1)
EOS PCT: 1 % (ref 0–5)
Eosinophils Absolute: 0.1 10*3/uL (ref 0.0–0.7)
HCT: 27 % — ABNORMAL LOW (ref 36.0–46.0)
Hemoglobin: 8.8 g/dL — ABNORMAL LOW (ref 12.0–15.0)
Lymphocytes Relative: 8 % — ABNORMAL LOW (ref 12–46)
Lymphs Abs: 0.4 10*3/uL — ABNORMAL LOW (ref 0.7–4.0)
MCH: 28.6 pg (ref 26.0–34.0)
MCHC: 32.6 g/dL (ref 30.0–36.0)
MCV: 87.7 fL (ref 78.0–100.0)
Monocytes Absolute: 0.5 10*3/uL (ref 0.1–1.0)
Monocytes Relative: 9 % (ref 3–12)
Neutro Abs: 4.2 10*3/uL (ref 1.7–7.7)
Neutrophils Relative %: 81 % — ABNORMAL HIGH (ref 43–77)
Platelets: 189 10*3/uL (ref 150–400)
RBC: 3.08 MIL/uL — ABNORMAL LOW (ref 3.87–5.11)
RDW: 16.3 % — ABNORMAL HIGH (ref 11.5–15.5)
WBC: 5.2 10*3/uL (ref 4.0–10.5)

## 2014-09-17 LAB — HEPATITIS B SURFACE ANTIGEN: Hepatitis B Surface Ag: NEGATIVE

## 2014-09-17 LAB — IRON AND TIBC
Iron: 11 ug/dL — ABNORMAL LOW (ref 42–145)
Saturation Ratios: 6 % — ABNORMAL LOW (ref 20–55)
TIBC: 180 ug/dL — ABNORMAL LOW (ref 250–470)
UIBC: 169 ug/dL (ref 125–400)

## 2014-09-17 LAB — GLUCOSE, RANDOM: Glucose, Bld: 88 mg/dL (ref 70–99)

## 2014-09-17 LAB — MRSA PCR SCREENING: MRSA BY PCR: NEGATIVE

## 2014-09-17 LAB — CBC
HCT: 27.6 % — ABNORMAL LOW (ref 36.0–46.0)
Hemoglobin: 8.9 g/dL — ABNORMAL LOW (ref 12.0–15.0)
MCH: 28.8 pg (ref 26.0–34.0)
MCHC: 32.2 g/dL (ref 30.0–36.0)
MCV: 89.3 fL (ref 78.0–100.0)
Platelets: 182 10*3/uL (ref 150–400)
RBC: 3.09 MIL/uL — ABNORMAL LOW (ref 3.87–5.11)
RDW: 16.2 % — ABNORMAL HIGH (ref 11.5–15.5)
WBC: 4 10*3/uL (ref 4.0–10.5)

## 2014-09-17 LAB — GLUCOSE, CAPILLARY: Glucose-Capillary: 96 mg/dL (ref 70–99)

## 2014-09-17 MED ORDER — DARBEPOETIN ALFA 100 MCG/0.5ML IJ SOSY
100.0000 ug | PREFILLED_SYRINGE | INTRAMUSCULAR | Status: DC
  Filled 2014-09-17: qty 0.5

## 2014-09-17 MED ORDER — HYDRALAZINE HCL 25 MG PO TABS
25.0000 mg | ORAL_TABLET | Freq: Three times a day (TID) | ORAL | Status: DC
  Administered 2014-09-17 – 2014-09-19 (×6): 25 mg via ORAL
  Filled 2014-09-17 (×10): qty 1

## 2014-09-17 MED ORDER — NITROGLYCERIN 0.4 MG SL SUBL: 0.4000 mg | SUBLINGUAL_TABLET | SUBLINGUAL | Status: DC | PRN

## 2014-09-17 MED ORDER — FUROSEMIDE 80 MG PO TABS
80.0000 mg | ORAL_TABLET | Freq: Two times a day (BID) | ORAL | Status: DC
  Filled 2014-09-17 (×3): qty 1

## 2014-09-17 MED ORDER — SODIUM CHLORIDE 0.9 % IJ SOLN
3.0000 mL | Freq: Two times a day (BID) | INTRAMUSCULAR | Status: DC
  Administered 2014-09-17 – 2014-09-18 (×3): 3 mL via INTRAVENOUS

## 2014-09-17 MED ORDER — SODIUM BICARBONATE 8.4 % IV SOLN
INTRAVENOUS | Status: AC
  Administered 2014-09-17: 02:00:00 via INTRAVENOUS
  Filled 2014-09-17: qty 150

## 2014-09-17 MED ORDER — OXYCODONE-ACETAMINOPHEN 5-325 MG PO TABS
1.0000 | ORAL_TABLET | Freq: Four times a day (QID) | ORAL | Status: DC | PRN
  Administered 2014-09-18: 1 via ORAL
  Filled 2014-09-17: qty 1

## 2014-09-17 MED ORDER — SODIUM CHLORIDE 0.9 % IV SOLN
1.0000 g | Freq: Once | INTRAVENOUS | Status: AC
  Administered 2014-09-17: 1 g via INTRAVENOUS
  Filled 2014-09-17: qty 10

## 2014-09-17 MED ORDER — PANTOPRAZOLE SODIUM 40 MG PO TBEC
40.0000 mg | DELAYED_RELEASE_TABLET | Freq: Two times a day (BID) | ORAL | Status: DC
  Administered 2014-09-17 – 2014-09-19 (×4): 40 mg via ORAL
  Filled 2014-09-17 (×4): qty 1

## 2014-09-17 MED ORDER — WARFARIN - PHARMACIST DOSING INPATIENT: Freq: Every day | Status: DC

## 2014-09-17 MED ORDER — DEXTROSE 50 % IV SOLN
1.0000 | Freq: Once | INTRAVENOUS | Status: AC
  Administered 2014-09-17: 50 mL via INTRAVENOUS
  Filled 2014-09-17 (×2): qty 50

## 2014-09-17 MED ORDER — COLCHICINE 0.6 MG PO TABS
0.6000 mg | ORAL_TABLET | Freq: Two times a day (BID) | ORAL | Status: DC
  Administered 2014-09-17 – 2014-09-19 (×4): 0.6 mg via ORAL
  Filled 2014-09-17 (×7): qty 1

## 2014-09-17 MED ORDER — ENSURE COMPLETE PO LIQD
237.0000 mL | Freq: Two times a day (BID) | ORAL | Status: DC
  Administered 2014-09-17 – 2014-09-18 (×2): 237 mL via ORAL

## 2014-09-17 MED ORDER — ISOSORBIDE MONONITRATE ER 60 MG PO TB24
120.0000 mg | ORAL_TABLET | Freq: Every day | ORAL | Status: DC
  Administered 2014-09-17 – 2014-09-19 (×3): 120 mg via ORAL
  Filled 2014-09-17 (×4): qty 2

## 2014-09-17 MED ORDER — INSULIN ASPART 100 UNIT/ML IV SOLN
5.0000 [IU] | Freq: Once | INTRAVENOUS | Status: DC
  Filled 2014-09-17: qty 0.05

## 2014-09-17 MED ORDER — DARBEPOETIN ALFA 100 MCG/0.5ML IJ SOSY
PREFILLED_SYRINGE | INTRAMUSCULAR | Status: AC
Start: 2014-09-17 — End: 2014-09-17
  Filled 2014-09-17: qty 0.5

## 2014-09-17 MED ORDER — MOMETASONE FURO-FORMOTEROL FUM 100-5 MCG/ACT IN AERO
2.0000 | INHALATION_SPRAY | Freq: Two times a day (BID) | RESPIRATORY_TRACT | Status: DC
  Administered 2014-09-17 – 2014-09-19 (×4): 2 via RESPIRATORY_TRACT
  Filled 2014-09-17: qty 8.8

## 2014-09-17 MED ORDER — SODIUM POLYSTYRENE SULFONATE 15 GM/60ML PO SUSP
60.0000 g | Freq: Once | ORAL | Status: DC
  Filled 2014-09-17: qty 240

## 2014-09-17 MED ORDER — ACETAMINOPHEN 650 MG RE SUPP: 650.0000 mg | Freq: Four times a day (QID) | RECTAL | Status: DC | PRN

## 2014-09-17 MED ORDER — INSULIN ASPART 100 UNIT/ML ~~LOC~~ SOLN
5.0000 [IU] | Freq: Once | SUBCUTANEOUS | Status: AC
  Administered 2014-09-17: 5 [IU] via INTRAVENOUS

## 2014-09-17 MED ORDER — SODIUM BICARBONATE 8.4 % IV SOLN: 150.0000 meq | Freq: Once | INTRAVENOUS | Status: DC

## 2014-09-17 MED ORDER — ALPRAZOLAM 0.5 MG PO TABS
0.5000 mg | ORAL_TABLET | Freq: Every evening | ORAL | Status: DC | PRN
  Administered 2014-09-19: 0.5 mg via ORAL
  Filled 2014-09-17: qty 1

## 2014-09-17 MED ORDER — ACETAMINOPHEN 325 MG PO TABS: 650.0000 mg | ORAL_TABLET | Freq: Four times a day (QID) | ORAL | Status: DC | PRN

## 2014-09-17 MED ORDER — DEXTROSE 5 % IV SOLN: Freq: Once | INTRAVENOUS | Status: DC

## 2014-09-17 MED ORDER — ONDANSETRON HCL 4 MG PO TABS
4.0000 mg | ORAL_TABLET | Freq: Four times a day (QID) | ORAL | Status: DC | PRN
  Administered 2014-09-18 – 2014-09-19 (×2): 4 mg via ORAL
  Filled 2014-09-17 (×2): qty 1

## 2014-09-17 MED ORDER — ONDANSETRON HCL 4 MG/2ML IJ SOLN: 4.0000 mg | Freq: Four times a day (QID) | INTRAMUSCULAR | Status: DC | PRN

## 2014-09-17 NOTE — H&P (Addendum)
[facility] History and Physical  Patient: [name]  MRN: [id number]  DOB: 07-05-1935  DOS: the patient was seen and examined on 09/17/2014 PCP: Tamsen Roers, MD  Chief Complaint: Left arm pain and swelling  HPI: [name] is a 80 y.o. female with Past medical history of chronic kidney disease, coronary artery disease, GI bleeding, atrial fibrillation on warfarin, hypothyroidism, valvular heart disease, chronic diastolic heart failure. The patient is presenting with complaints of of left arm swelling. Patient was recently admitted in the hospital with GI bleeding and workup was negative for any source of bleeding. She was restarted on warfarin. Patient was following up with his cardiology. Patient has been complaining of left arm swelling and pain that has been ongoing for last few days. No trauma no injury. No fever no chills. No nausea no vomiting. No diarrhea. Patient has poor appetite and has been drinking a lot of orange juice and eating a lot of banana. Denies any diarrhea or constipation or active bleeding. Denies any burning urination. Mentions has history of gout. Mentions she has been taking all of her medication  The patient is coming from home. And at her baseline independent for most of her ADL.  Review of Systems: as mentioned in the history of present illness.  A Comprehensive review of the other systems is negative.  Past Medical History  Diagnosis Date  . Cellulitis and abscess of unspecified site     a. h/o MSRA cellulitis of abdominal wall.  Marland Kitchen CAD (coronary artery disease)     a. DES to OM 01/2005. b. Rotational atherotomy/DES to prox LAD 02/2008. c. Cath 07/2013: stable, for med rx.  . Edema   . Warfarin anticoagulation     a. Followed by PCP.  Marland Kitchen GI bleed     a. EGD 01/2007: antral ulcers related to NSAID/aspirin use.  . Peripheral vascular disease   . Hyperthyroidism     a. Hx of intolerance to PTU.  Marland Kitchen Blood transfusion years ago    "heart  problems" (09/26/2013)  . H/O hiatal hernia   . Umbilical hernia     unrepaired (11/24/11)  . Valvular heart disease     Echo 11/2011: mild-mod MR.  Marland Kitchen Hypertension     takes Amlodipine,Hydralazine,and Imdur daily  . Hyperlipidemia     takes Lovastatin daily  . SSS (sick sinus syndrome)     a. H/o AV node ablation 2008 secondary to difficult to control afib/flutter, with subsequent pacer.  . Atrial fibrillation with rapid ventricular response     a. H/o amiodarone thyroiditis. b. s/p AV node ablation 2008 with implantation of PPM 01/2007, upgraded to CRT-P 06/2007.;takes Coumadin daily  . GERD (gastroesophageal reflux disease)     takes Protonix daily  . Chronic diastolic CHF (congestive heart failure)     takes Lasix daily and Spironolactone   . Pacemaker     Medtronic  . History of gout   . Hemorrhoids   . Depression with anxiety     takes Xanax daily  . Insomnia     takes Xanax if needed  . Heart murmur   . CKD (chronic kidney disease)     Cr ~2.3.  . Kidney stones   . Myocardial infarction     "I've had 2-3; last one was in /1996" (09/26/2013)  . Migraines     "last one was a pretty good while ago" (09/26/2013)  . Arthritis     "all over" (09/26/2013)  . Chronic lower back  pain     deteriorating disc  . Iron deficiency anemia     takes an Iron Pill daily  . Pneumonia    Past Surgical History  Procedure Laterality Date  . Esophagogastroduodenoscopy    . Tonsillectomy  ~ 1960  . Laminectomy and microdiscectomy lumbar spine  08/2003  . Cardioversion  10/2004; 02/2007    /E-chart  . Cholecystectomy    . Appendectomy  1954  . Vaginal hysterectomy  ?1981  . Av node ablation  02/2007    /E-chart  . Insert / replace / remove pacemaker  01/2007    initial placement  . Insert / replace / remove pacemaker  06/2007    upgraded/E-chart  . Av fistula placement, brachiocephalic Right 08/6382    "didn't work; my veins were too small"  . Joint replacement    . Total knee  arthroplasty Left 01/2009  . Bladder and bowel  1970's    "tacked; damaged my intestines & had to remove some; done @ [facility]  . Cataract extraction w/ intraocular lens  implant, bilateral Bilateral   . Bascilic vein transposition Right 06/21/2013    Procedure: [place];  Surgeon: Serafina Mitchell, MD;  Location: [facility];  Service: Vascular;  Laterality: Right;  . Esophagogastroduodenoscopy N/A 07/19/2013    Procedure: ESOPHAGOGASTRODUODENOSCOPY (EGD);  Surgeon: Lafayette Dragon, MD;  Location[facility] ENDOSCOPY;  Service: Endoscopy;  Laterality: N/A;  . Balloon dilation N/A 07/19/2013    Procedure: BALLOON DILATION;  Surgeon: Lafayette Dragon, MD;  Location: [facility] ENDOSCOPY;  Service: Endoscopy;  Laterality: N/A;  . Esophagogastroduodenoscopy N/A 07/20/2013    Procedure: ESOPHAGOGASTRODUODENOSCOPY (EGD);  Surgeon: Lafayette Dragon, MD;  Location: [facility] ENDOSCOPY;  Service: Endoscopy;  Laterality: N/A;  with removal of polyp in pylorus  . Bascilic vein transposition Right 09/20/2013    Procedure: 2ND STAGE RIGHT BASCILIC VEIN TRANSPOSITION; ULTRSOUND GUIDED;  Surgeon: Serafina Mitchell, MD;  Location[facility] OR;  Service: Vascular;  Laterality: Right;  . Cardiac catheterization  2006; 2008; 2009; 01/13/2011  . Coronary angioplasty with stent placement  ~ 2007; ?    "had a total of 2 stents put in" 92/07/2014)  . Back surgery    . Left heart catheterization with coronary angiogram N/A 07/15/2013    Procedure: LEFT HEART CATHETERIZATION WITH CORONARY ANGIOGRAM;  Surgeon: Blane Ohara, MD;  Location[facility] CATH LAB;  Service: Cardiovascular;  Laterality: N/A;  . Esophagogastroduodenoscopy N/A 07/25/2014    Procedure: ESOPHAGOGASTRODUODENOSCOPY (EGD);  Surgeon: Irene Shipper, MD;  Location: [facility] ENDOSCOPY;  Service: Endoscopy;  Laterality: N/A;  . Esophagogastroduodenoscopy N/A 08/09/2014    Procedure: ESOPHAGOGASTRODUODENOSCOPY (EGD);  Surgeon: Inda Castle, MD;  Location: [facility] ENDOSCOPY;  Service:  Endoscopy;  Laterality: N/A;  Possible enteroscopy  . Givens capsule study N/A 08/11/2014    Procedure: GIVENS CAPSULE STUDY;  Surgeon: Inda Castle, MD;  Location: [facility] ENDOSCOPY;  Service: Endoscopy;  Laterality: N/A;  . Colonoscopy  last 2011   Social History:  reports that she quit smoking about 19 years ago. Her smoking use included Cigarettes. She has a 50 pack-year smoking history. She has never used smokeless tobacco. She reports that she does not drink alcohol or use illicit drugs.  Allergies  Allergen Reactions  . Amiodarone Hives  . Propylthiouracil Rash    Family History  Problem Relation Age of Onset  . Colon cancer Neg Hx   . Thyroid disease Neg Hx   . Stroke Brother   .  Hypertension Brother   . Kidney disease Brother   . Kidney disease Brother   . Other Mother     Sepsis and perforated colon  . Kidney disease Mother   . Other Father     Motor vehicle accident  . Learning disabilities Son   . Kidney disease Maternal Aunt   . Kidney disease Maternal Grandmother     Prior to Admission medications   Medication Sig Start Date End Date Taking? Authorizing Provider  ALPRAZolam Duanne Moron) 0.5 MG tablet Take 1 tablet (0.5 mg total) by mouth at bedtime as needed for anxiety or sleep. 08/13/14  Yes Bonnielee Haff, MD  colchicine 0.6 MG tablet Take 1 tablet (0.6 mg total) by mouth 2 (two) times daily as needed (Gout). 08/13/14  Yes Bonnielee Haff, MD  ergocalciferol (VITAMIN D2) 50000 UNITS capsule Take 1 capsule (50,000 Units total) by mouth every Saturday. Take on saturdays 08/13/14  Yes Bonnielee Haff, MD  Fluticasone-Salmeterol (ADVAIR DISKUS) 100-50 MCG/DOSE AEPB Inhale 1 puff into the lungs 2 (two) times daily. 08/13/14  Yes Bonnielee Haff, MD  furosemide (LASIX) 80 MG tablet Take 1 tablet (80 mg total) by mouth 2 (two) times daily. 08/13/14  Yes Bonnielee Haff, MD  hydrALAZINE (APRESOLINE) 25 MG tablet Take 1 tablet (25 mg total) by mouth 3 (three) times daily. 08/13/14   Yes Bonnielee Haff, MD  isosorbide mononitrate (IMDUR) 60 MG 24 hr tablet Take 2 tablets (120 mg total) by mouth daily. 08/13/14  Yes Bonnielee Haff, MD  loratadine (CLARITIN) 10 MG tablet Take 1 tablet (10 mg total) by mouth daily. 08/13/14  Yes Bonnielee Haff, MD  lovastatin (MEVACOR) 10 MG tablet Take 1 tablet (10 mg total) by mouth at bedtime. 08/13/14  Yes Bonnielee Haff, MD  magnesium oxide (MAG-OX) 400 MG tablet Take 1 tablet (400 mg total) by mouth at bedtime. 08/13/14  Yes Bonnielee Haff, MD  metolazone (ZAROXOLYN) 2.5 MG tablet Take 1 tablet (2.5 mg total) by mouth 2 (two) times a week. Monday and Friday 08/13/14  Yes Bonnielee Haff, MD  nitroGLYCERIN (NITROSTAT) 0.4 MG SL tablet Place 1 tablet (0.4 mg total) under the tongue every 5 (five) minutes as needed for chest pain. 08/13/14  Yes Bonnielee Haff, MD  ondansetron (ZOFRAN) 4 MG tablet Take 1 tablet (4 mg total) by mouth every 8 (eight) hours as needed for nausea or vomiting. 08/13/14  Yes Bonnielee Haff, MD  oxyCODONE-acetaminophen (PERCOCET/ROXICET) 5-325 MG per tablet Take 1 tablet by mouth every 6 (six) hours as needed for severe pain. 08/13/14  Yes Bonnielee Haff, MD  pantoprazole (PROTONIX) 40 MG tablet Take 1 tablet (40 mg total) by mouth 2 (two) times daily. 08/13/14  Yes Bonnielee Haff, MD  potassium chloride SA (K-DUR,KLOR-CON) 20 MEQ tablet Take 2 tablets (40 mEq total) by mouth 2 (two) times daily. 08/13/14  Yes Bonnielee Haff, MD  warfarin (COUMADIN) 1 MG tablet Take 1 tablet (1 mg total) by mouth daily at 6 PM. 08/13/14  Yes Bonnielee Haff, MD  camphor-menthol [facility]) lotion Apply topically as needed for itching. Patient taking differently: Apply 1 application topically daily as needed for itching.  05/12/14   Barton Dubois, MD  levothyroxine (SYNTHROID, LEVOTHROID) 100 MCG tablet Take 1 tablet (100 mcg total) by mouth daily before breakfast. Patient not taking: Reported on 09/16/2014 08/13/14   Bonnielee Haff, MD   promethazine (PHENERGAN) 25 MG tablet Take 1 tablet (25 mg total) by mouth every 8 (eight) hours as needed for nausea or vomiting. Patient not  taking: Reported on 09/16/2014 08/13/14   Bonnielee Haff, MD    Physical Exam: Filed Vitals:   09/17/14 0115 09/17/14 0145 09/17/14 0236 09/17/14 0544  BP: 126/59 142/46 139/44 145/34  Pulse: 60 62 63 61  Temp:   98 F (36.7 C) 97.8 F (36.6 C)  TempSrc:   Oral Oral  Resp: 24 25 20 22   Height:   5\' 7"  (1.702 m)   Weight:   81.5 kg (179 lb 10.8 oz)   SpO2: 96% 94% 98% 98%    General: Alert, Awake and Oriented to Time, Place and Person. Appear in mild distress Eyes: PERRL ENT: Oral Mucosa clear moist. Neck: mild JVD Cardiovascular: S1 and S2 Present, aortic systolic Murmur, Peripheral Pulses Present Respiratory: Bilateral Air entry equal and Decreased, basal Crackles, no wheezes Abdomen: Bowel Sound present, Soft and non tender Skin: no Rash Extremities: Trace Pedal edema, no calf tenderness Neurologic: Grossly no focal neuro deficit.  Labs on Admission:  CBC:  Recent Labs Lab 09/16/14 1938 09/17/14 0523  WBC 4.3 5.2  NEUTROABS 3.3 4.2  HGB 10.6* 8.8*  HCT 32.2* 27.0*  MCV 88.2 87.7  PLT 199 189    CMP     Component Value Date/Time   NA 131* 09/17/2014 0058   K 6.7* 09/17/2014 0058   CL 105 09/17/2014 0058   CO2 19 09/17/2014 0058   GLUCOSE 88 09/17/2014 0058   GLUCOSE 89 09/17/2014 0058   BUN 36* 09/17/2014 0058   CREATININE 3.14* 09/17/2014 0058   CALCIUM 9.0 09/17/2014 0058   CALCIUM 8.7 03/02/2007 0505   PROT 6.2 08/07/2014 0933   ALBUMIN 2.4* 08/07/2014 0933   AST 53* 08/07/2014 0933   ALT 24 08/07/2014 0933   ALKPHOS 82 08/07/2014 0933   BILITOT 2.0* 08/07/2014 0933   GFRNONAA 13* 09/17/2014 0058   GFRAA 15* 09/17/2014 0058    No results for input(s): LIPASE, AMYLASE in the last 168 hours.  No results for input(s): CKTOTAL, CKMB, CKMBINDEX, TROPONINI in the last 168 hours. BNP (last 3 results) No  results for input(s): BNP in the last 8760 hours.  ProBNP (last 3 results)  Recent Labs  10/28/13 1450 05/07/14 1659 05/10/14 0508  PROBNP 1368.0* 11688.0* 12366.0*     Radiological Exams on Admission: Dg Hand Complete Left  09/16/2014   CLINICAL DATA:  Left hand pain and swelling, along the dorsum of the hand. Initial encounter.  EXAM: LEFT HAND - COMPLETE 3+ VIEW  COMPARISON:  None.  FINDINGS: There is no definite evidence of fracture or dislocation. Multiple erosions are noted at the carpal rows, relatively focal along the mid and distal scaphoid. Degenerative change is noted at the first carpometacarpal joint, with chronic subluxation an underlying osteophyte formation. There is calcification of the triangular fibrocartilage.  Diffuse dorsal soft tissue swelling is noted, without evidence of associated fracture. Mild chronic deformity is noted at the base of the fifth metacarpal, likely reflecting remote injury.  IMPRESSION: 1. No definite evidence of fracture or dislocation. 2. Multiple erosions of the carpal rows, reflecting underlying erosive arthropathy. 3. Degenerative change at the first carpometacarpal joint likely reflects osteoarthritis. 4. Calcification of the triangular fibrocartilage. 5. Diffuse dorsal soft tissue swelling noted.   Electronically Signed   By: Garald Balding M.D.   On: 09/16/2014 21:26    EKG: Independently reviewed. A flutter rate controlled.  Assessment/Plan Principal Problem:   Hyperkalemia Active Problems:   Anemia   Essential hypertension   CAD, NATIVE VESSEL   Gastric  ulcer (2008)   PACEMAKER-[employer]tr[employer]   CKD (chronic kidney disease) stage 4, GFR 15-29 ml/min   Supratherapeutic INR   Other specified hypothyroidism   Gout   1. Hyperkalemia The patient is presenting with complaints of left arm swelling and pain. On further workup she was found to have significant hyperkalemia which was rechecked and verified. With her significantly elevated  potassium level patient was discussed with nephrology who mentioned conservative management with Kayexalate and sodium bicarbonate as well as insulin and dextrose. Patient's potassium has been trending down. Patient at present does not have any BM after receiving Kayexalate. Continue close monitoring. Monitor in stepdown. Nephrology will be available as needed for urgent dialysis. They will be following up with the patient in morning. Stop potassium.  2. Chronic kidney disease. Patient has a shunt which has not matured. At present continue close monitoring.  3. Supratherapeutic INR, a flutter. Patient has history of a flutter and has subtherapeutic INR. Pharmacy will be managing warfarin dosing. No active bleeding. Patient recently was admitted for GI bleeding.  4. Chronic diastolic heart failure. Patient is on Lasix. At present I'll continue it. Holding metolazone.  5. Left arm swelling. Patient presented with left arm swelling and redness. Initially there was suspicion for cellulitis. Although patient does not have any fever nor has a leukocytosis. Patient has history of gout and this appears of possible gout attack. I would place her on colchicine.    Advance goals of care discussion: Full code. Patient was prior to this admission was DNR/DNI. But patient mentions intermittent she wants to be resuscitated but does not want to be remaining on life support for long. Her son Mr. Lawson Radar will be her power of attorney  Consults: Nephrology  DVT Prophylaxis: on chronic anticoagulation Nutrition: Cardiac and renal diet  Family Communication: Son was present at bedside, opportunity was given to ask question and all questions were answered satisfactorily at the time of interview. Disposition: Admitted to inpatient in step-down unit.  Author: Berle Mull, MD [facility] Pager: [phone] 09/17/2014, 6:31 AM    If 7PM-7AM, please contact  night-coverage [email or web address] Password [email or web address]

## 2014-09-17 NOTE — Progress Notes (Signed)
[facility]  Patient Status: New  [facility] is providing the following services: RN and PT  If patient discharges after hours, please call [phone].   [name] 09/17/2014, 10:26 AM

## 2014-09-17 NOTE — ED Notes (Signed)
Spoke to phlebotomy about bmp and glucose now ready to be collected.

## 2014-09-17 NOTE — Consult Note (Signed)
Referring Provider: No ref. provider found Primary Care Physician:  Tamsen Roers, MD Primary Nephrologist:  Dr. Lorrene Reid  Reason for Consultation:  Hyperkalemia HPI: [name] is a 79 y.o. female with Past medical history of chronic kidney disease, coronary artery disease, GI bleeding, atrial fibrillation on warfarin, hypothyroidism, valvular heart disease, chronic diastolic heart failure. The patient is presenting with complaints of of left arm swelling. She has a right arm fistula placed in 2014 by Dr Trula Slade. It was discovered that she had an elevated potassium that was treated appropriately in  The ER although she did have some rebound or her hyperkalemia this morning. EKG does demonstrated a paced rhythm and some peaking of the T waves.   Past Medical History  Diagnosis Date  . Cellulitis and abscess of unspecified site     a. h/o MSRA cellulitis of abdominal wall.  Marland Kitchen CAD (coronary artery disease)     a. DES to OM 01/2005. b. Rotational atherotomy/DES to prox LAD 02/2008. c. Cath 07/2013: stable, for med rx.  . Edema   . Warfarin anticoagulation     a. Followed by PCP.  Marland Kitchen GI bleed     a. EGD 01/2007: antral ulcers related to NSAID/aspirin use.  . Peripheral vascular disease   . Hyperthyroidism     a. Hx of intolerance to PTU.  Marland Kitchen Blood transfusion years ago    "heart problems" (09/26/2013)  . H/O hiatal hernia   . Umbilical hernia     unrepaired (11/24/11)  . Valvular heart disease     Echo 11/2011: mild-mod MR.  Marland Kitchen Hypertension     takes Amlodipine,Hydralazine,and Imdur daily  . Hyperlipidemia     takes Lovastatin daily  . SSS (sick sinus syndrome)     a. H/o AV node ablation 2008 secondary to difficult to control afib/flutter, with subsequent pacer.  . Atrial fibrillation with rapid ventricular response     a. H/o amiodarone thyroiditis. b. s/p AV node ablation 2008 with implantation of PPM 01/2007, upgraded to CRT-P 06/2007.;takes Coumadin daily  . GERD (gastroesophageal  reflux disease)     takes Protonix daily  . Chronic diastolic CHF (congestive heart failure)     takes Lasix daily and Spironolactone   . Pacemaker     Medtronic  . History of gout   . Hemorrhoids   . Depression with anxiety     takes Xanax daily  . Insomnia     takes Xanax if needed  . Heart murmur   . CKD (chronic kidney disease)     Cr ~2.3.  . Kidney stones   . Myocardial infarction     "I've had 2-3; last one was in /1996" (09/26/2013)  . Migraines     "last one was a pretty good while ago" (09/26/2013)  . Arthritis     "all over" (09/26/2013)  . Chronic lower back pain     deteriorating disc  . Iron deficiency anemia     takes an Iron Pill daily  . Pneumonia     Past Surgical History  Procedure Laterality Date  . Esophagogastroduodenoscopy    . Tonsillectomy  ~ 1960  . Laminectomy and microdiscectomy lumbar spine  08/2003  . Cardioversion  10/2004; 02/2007    /E-chart  . Cholecystectomy    . Appendectomy  1954  . Vaginal hysterectomy  ?1981  . Av node ablation  02/2007    /E-chart  . Insert / replace / remove pacemaker  01/2007    initial placement  .  Insert / replace / remove pacemaker  06/2007    upgraded/E-chart  . Av fistula placement, brachiocephalic Right 10/2949    "didn't work; my veins were too small"  . Joint replacement    . Total knee arthroplasty Left 01/2009  . Bladder and bowel  1970's    "tacked; damaged my intestines & had to remove some; done @ [facility]  . Cataract extraction w/ intraocular lens  implant, bilateral Bilateral   . Bascilic vein transposition Right 06/21/2013    Procedure: [place];  Surgeon: Serafina Mitchell, MD;  Location: [facility] OR;  Service: Vascular;  Laterality: Right;  . Esophagogastroduodenoscopy N/A 07/19/2013    Procedure: ESOPHAGOGASTRODUODENOSCOPY (EGD);  Surgeon: Lafayette Dragon, MD;  Location: [facility] ENDOSCOPY;  Service: Endoscopy;  Laterality: N/A;  . Balloon dilation N/A 07/19/2013    Procedure:  BALLOON DILATION;  Surgeon: Lafayette Dragon, MD;  Location: [facility] ENDOSCOPY;  Service: Endoscopy;  Laterality: N/A;  . Esophagogastroduodenoscopy N/A 07/20/2013    Procedure: ESOPHAGOGASTRODUODENOSCOPY (EGD);  Surgeon: Lafayette Dragon, MD;  Location: [facility] ENDOSCOPY;  Service: Endoscopy;  Laterality: N/A;  with removal of polyp in pylorus  . Bascilic vein transposition Right 09/20/2013    Procedure: 2ND STAGE RIGHT BASCILIC VEIN TRANSPOSITION; ULTRSOUND GUIDED;  Surgeon: Serafina Mitchell, MD;  Location: [facility] OR;  Service: Vascular;  Laterality: Right;  . Cardiac catheterization  2006; 2008; 2009; 01/13/2011  . Coronary angioplasty with stent placement  ~ 2007; ?    "had a total of 2 stents put in" 92/07/2014)  . Back surgery    . Left heart catheterization with coronary angiogram N/A 07/15/2013    Procedure: LEFT HEART CATHETERIZATION WITH CORONARY ANGIOGRAM;  Surgeon: Blane Ohara, MD;  Location: [facility] CATH LAB;  Service: Cardiovascular;  Laterality: N/A;  . Esophagogastroduodenoscopy N/A 07/25/2014    Procedure: ESOPHAGOGASTRODUODENOSCOPY (EGD);  Surgeon: Irene Shipper, MD;  Location: [facility] ENDOSCOPY;  Service: Endoscopy;  Laterality: N/A;  . Esophagogastroduodenoscopy N/A 08/09/2014    Procedure: ESOPHAGOGASTRODUODENOSCOPY (EGD);  Surgeon: Inda Castle, MD;  Location: [facility] ENDOSCOPY;  Service: Endoscopy;  Laterality: N/A;  Possible enteroscopy  . Givens capsule study N/A 08/11/2014    Procedure: GIVENS CAPSULE STUDY;  Surgeon: Inda Castle, MD;  Location: [facility] ENDOSCOPY;  Service: Endoscopy;  Laterality: N/A;  . Colonoscopy  last 2011    Prior to Admission medications   Medication Sig Start Date End Date Taking? Authorizing Provider  ALPRAZolam Duanne Moron) 0.5 MG tablet Take 1 tablet (0.5 mg total) by mouth at bedtime as needed for anxiety or sleep. 08/13/14  Yes Bonnielee Haff, MD  colchicine 0.6 MG tablet Take 1 tablet (0.6 mg total) by mouth 2 (two) times daily as needed (Gout). 08/13/14  Yes Bonnielee Haff,  MD  ergocalciferol (VITAMIN D2) 50000 UNITS capsule Take 1 capsule (50,000 Units total) by mouth every Saturday. Take on saturdays 08/13/14  Yes Bonnielee Haff, MD  Fluticasone-Salmeterol (ADVAIR DISKUS) 100-50 MCG/DOSE AEPB Inhale 1 puff into the lungs 2 (two) times daily. 08/13/14  Yes Bonnielee Haff, MD  furosemide (LASIX) 80 MG tablet Take 1 tablet (80 mg total) by mouth 2 (two) times daily. 08/13/14  Yes Bonnielee Haff, MD  hydrALAZINE (APRESOLINE) 25 MG tablet Take 1 tablet (25 mg total) by mouth 3 (three) times daily. 08/13/14  Yes Bonnielee Haff, MD  isosorbide mononitrate (IMDUR) 60 MG 24 hr tablet Take 2 tablets (120 mg total) by mouth daily. 08/13/14  Yes Bonnielee Haff, MD  loratadine (CLARITIN)  10 MG tablet Take 1 tablet (10 mg total) by mouth daily. 08/13/14  Yes Bonnielee Haff, MD  lovastatin (MEVACOR) 10 MG tablet Take 1 tablet (10 mg total) by mouth at bedtime. 08/13/14  Yes Bonnielee Haff, MD  magnesium oxide (MAG-OX) 400 MG tablet Take 1 tablet (400 mg total) by mouth at bedtime. 08/13/14  Yes Bonnielee Haff, MD  metolazone (ZAROXOLYN) 2.5 MG tablet Take 1 tablet (2.5 mg total) by mouth 2 (two) times a week. Monday and Friday 08/13/14  Yes Bonnielee Haff, MD  nitroGLYCERIN (NITROSTAT) 0.4 MG SL tablet Place 1 tablet (0.4 mg total) under the tongue every 5 (five) minutes as needed for chest pain. 08/13/14  Yes Bonnielee Haff, MD  ondansetron (ZOFRAN) 4 MG tablet Take 1 tablet (4 mg total) by mouth every 8 (eight) hours as needed for nausea or vomiting. 08/13/14  Yes Bonnielee Haff, MD  oxyCODONE-acetaminophen (PERCOCET/ROXICET) 5-325 MG per tablet Take 1 tablet by mouth every 6 (six) hours as needed for severe pain. 08/13/14  Yes Bonnielee Haff, MD  pantoprazole (PROTONIX) 40 MG tablet Take 1 tablet (40 mg total) by mouth 2 (two) times daily. 08/13/14  Yes Bonnielee Haff, MD  potassium chloride SA (K-DUR,KLOR-CON) 20 MEQ tablet Take 2 tablets (40 mEq total) by mouth 2 (two) times  daily. 08/13/14  Yes Bonnielee Haff, MD  warfarin (COUMADIN) 1 MG tablet Take 1 tablet (1 mg total) by mouth daily at 6 PM. 08/13/14  Yes Bonnielee Haff, MD  camphor-menthol [facility]) lotion Apply topically as needed for itching. Patient taking differently: Apply 1 application topically daily as needed for itching.  05/12/14   Barton Dubois, MD  levothyroxine (SYNTHROID, LEVOTHROID) 100 MCG tablet Take 1 tablet (100 mcg total) by mouth daily before breakfast. Patient not taking: Reported on 09/16/2014 08/13/14   Bonnielee Haff, MD  promethazine (PHENERGAN) 25 MG tablet Take 1 tablet (25 mg total) by mouth every 8 (eight) hours as needed for nausea or vomiting. Patient not taking: Reported on 09/16/2014 08/13/14   Bonnielee Haff, MD    Current Facility-Administered Medications  Medication Dose Route Frequency Provider Last Rate Last Dose  . acetaminophen (TYLENOL) tablet 650 mg  650 mg Oral Q6H PRN Berle Mull, MD       Or  . acetaminophen (TYLENOL) suppository 650 mg  650 mg Rectal Q6H PRN Berle Mull, MD      . albuterol (PROVENTIL, VENTOLIN) (5 MG/ML) 0.5% continuous inhalation solution           . ALPRAZolam (XANAX) tablet 0.5 mg  0.5 mg Oral QHS PRN Berle Mull, MD      . calcium gluconate 1 g in sodium chloride 0.9 % 100 mL IVPB  1 g Intravenous Once Phillips Climes, MD      . colchicine tablet 0.6 mg  0.6 mg Oral BID Berle Mull, MD      . dextrose 50 % solution 50 mL  1 ampule Intravenous Once Phillips Climes, MD      . feeding supplement (ENSURE COMPLETE) (ENSURE COMPLETE) liquid 237 mL  237 mL Oral BID BM Berle Mull, MD      . furosemide (LASIX) tablet 80 mg  80 mg Oral BID Berle Mull, MD      . hydrALAZINE (APRESOLINE) tablet 25 mg  25 mg Oral TID Berle Mull, MD      . insulin aspart (novoLOG) injection 5 Units  5 Units Intravenous Once Phillips Climes, MD      . isosorbide mononitrate (IMDUR) 24  hr tablet 120 mg  120 mg Oral Daily Berle Mull, MD      .  mometasone-formoterol (DULERA) 100-5 MCG/ACT inhaler 2 puff  2 puff Inhalation BID Berle Mull, MD      . nitroGLYCERIN (NITROSTAT) SL tablet 0.4 mg  0.4 mg Sublingual Q5 min PRN Berle Mull, MD      . ondansetron (ZOFRAN) tablet 4 mg  4 mg Oral Q6H PRN Berle Mull, MD       Or  . ondansetron (ZOFRAN) injection 4 mg  4 mg Intravenous Q6H PRN Berle Mull, MD      . oxyCODONE-acetaminophen (PERCOCET/ROXICET) 5-325 MG per tablet 1 tablet  1 tablet Oral Q6H PRN Berle Mull, MD      . pantoprazole (PROTONIX) EC tablet 40 mg  40 mg Oral BID Berle Mull, MD      . sodium bicarbonate 150 mEq in dextrose 5 % 1,000 mL infusion   Intravenous Continuous Ernestina Patches, MD 50 mL/hr at 09/17/14 0155    . sodium chloride 0.9 % injection 3 mL  3 mL Intravenous Q12H Berle Mull, MD   3 mL at 09/17/14 0415  . sodium polystyrene (KAYEXALATE) 15 GM/60ML suspension 60 g  60 g Oral Once Phillips Climes, MD      . Warfarin - Pharmacist Dosing Inpatient   Does not apply [facility]        Allergies as of 09/16/2014 - Review Complete 09/16/2014  Allergen Reaction Noted  . Amiodarone Hives   . Propylthiouracil Rash 11/23/2011    Family History  Problem Relation Age of Onset  . Colon cancer Neg Hx   . Thyroid disease Neg Hx   . Stroke Brother   . Hypertension Brother   . Kidney disease Brother   . Kidney disease Brother   . Other Mother     Sepsis and perforated colon  . Kidney disease Mother   . Other Father     Motor vehicle accident  . Learning disabilities Son   . Kidney disease Maternal Aunt   . Kidney disease Maternal Grandmother     History   Social History  . Marital Status: Widowed    Spouse Name: N/A    Number of Children: N/A  . Years of Education: N/A   Occupational History  . Not on file.   Social History Main Topics  . Smoking status: Former Smoker -- 1.00 packs/day for 50 years    Types: Cigarettes    Quit date: 07/13/1995  . Smokeless tobacco:  Never Used     Comment: quit smoking around 1996  . Alcohol Use: No  . Drug Use: No  . Sexual Activity: No   Other Topics Concern  . Not on file   Social History Narrative   Lives in [place].    Review of Systems: Gen:  Feels generally unwell. HEENT: No visual complaints, No history of Retinopathy. Normal external appearance No Epistaxis or Sore throat. No sinusitis.   CV: Denies chest pain, angina, palpitations, syncope, orthopnea, PND, peripheral edema, and claudication. Resp: Denies dyspnea at rest, dyspnea with exercise, cough, sputum, wheezing, coughing up blood, and pleurisy. GI: History of GI bleed with no obvious focus found GU : Denies urinary burning, blood in urine, urinary frequency, urinary hesitancy, nocturnal urination, and urinary incontinence.  No renal calculi. MS: Denies joint pain, limitation of movement, and swelling, stiffness, low back pain, extremity pain. Denies muscle weakness, cramps, atrophy.  No use of non steroidal antiinflammatory  drugs. Derm: Denies rash, itching, dry skin, hives, moles, warts, or unhealing ulcers.  Psych: Denies depression, anxiety, memory loss, suicidal ideation, hallucinations, paranoia, and confusion. Heme: Denies bruising, bleeding, and enlarged lymph nodes. Neuro: No headache.  No diplopia. No dysarthria.  No dysphasia.  No history of CVA.  No Seizures. No paresthesias.  No weakness. Endocrine No DM.  No Thyroid disease.  No Adrenal disease.  Physical Exam: Vital signs in last 24 hours: Temp:  [97.5 F (36.4 C)-98 F (36.7 C)] 97.5 F (36.4 C) (02/03 0710) Pulse Rate:  [59-66] 61 (02/03 0710) Resp:  [14-29] 23 (02/03 0710) BP: (126-185)/(34-83) 134/54 mmHg (02/03 0710) SpO2:  [94 %-100 %] 98 % (02/03 0710) Weight:  [81.103 kg (178 lb 12.8 oz)-81.5 kg (179 lb 10.8 oz)] 81.5 kg (179 lb 10.8 oz) (02/03 0236)   General:   Alert,  Ill appearing  Head:  Normocephalic and atraumatic. Eyes:  Sclera clear, no icterus.    Conjunctiva pink. Ears:  Normal auditory acuity. Nose:  No deformity, discharge,  or lesions. Mouth:  No deformity or lesions, dentition normal. Neck:  Supple; no masses or thyromegaly. JVP mild  Lungs:  Clear throughout to auscultation.   anteriorly   Heart:  Regular rate and rhythm;paced rhythm Abdomen:  Soft Hypoactive bowel sounds Msk:  Symmetrical without gross deformities. Normal posture. Pulses:  No carotid, renal, femoral bruits. DP and PT symmetrical and equal Extremities:  Without clubbing or edema. Neurologic:  Alert and  oriented x4;  grossly normal neurologically. Skin:  Intact without significant lesions or rashes.   Intake/Output from previous day: 02/02 0701 - 02/03 0700 In: 100 [I.V.:100] Out: 77 [Urine:76; Stool:1] Intake/Output this shift: Total I/O In: -  Out: 1 [Stool:1]  Lab Results:  Recent Labs  09/16/14 1938 09/17/14 0523  WBC 4.3 5.2  HGB 10.6* 8.8*  HCT 32.2* 27.0*  PLT 199 189   BMET  Recent Labs  09/16/14 2104 09/17/14 0058 09/17/14 0523  NA 130* 131* 133*  K 7.4* 6.7* >7.5*  CL 104 105 108  CO2 19 19 17*  GLUCOSE 100* 89  88 112*  BUN 37* 36* 39*  CREATININE 2.97* 3.14* 3.19*  CALCIUM 8.6 9.0 8.5   LFT No results for input(s): PROT, ALBUMIN, AST, ALT, ALKPHOS, BILITOT, BILIDIR, IBILI in the last 72 hours. PT/INR  Recent Labs  09/16/14 1938  LABPROT 48.4*  INR 5.32*   Hepatitis Panel No results for input(s): HEPBSAG, HCVAB, HEPAIGM, HEPBIGM in the last 72 hours.  Studies/Results: Dg Hand Complete Left  09/16/2014   CLINICAL DATA:  Left hand pain and swelling, along the dorsum of the hand. Initial encounter.  EXAM: LEFT HAND - COMPLETE 3+ VIEW  COMPARISON:  None.  FINDINGS: There is no definite evidence of fracture or dislocation. Multiple erosions are noted at the carpal rows, relatively focal along the mid and distal scaphoid. Degenerative change is noted at the first carpometacarpal joint, with chronic subluxation an  underlying osteophyte formation. There is calcification of the triangular fibrocartilage.  Diffuse dorsal soft tissue swelling is noted, without evidence of associated fracture. Mild chronic deformity is noted at the base of the fifth metacarpal, likely reflecting remote injury.  IMPRESSION: 1. No definite evidence of fracture or dislocation. 2. Multiple erosions of the carpal rows, reflecting underlying erosive arthropathy. 3. Degenerative change at the first carpometacarpal joint likely reflects osteoarthritis. 4. Calcification of the triangular fibrocartilage. 5. Diffuse dorsal soft tissue swelling noted.   Electronically Signed   By:  Garald Balding M.D.   On: 09/16/2014 21:26    Assessment/Plan: Chronic kidney disease stage 5 with AVF placed 2014. She does not particularly want to start dialysis although she understands the life threatening nature of her condition and the elevation of her potassium. We shall start dialysis this morning  Hyperkalemia would agree with the emergent administration of Calcium gluconate and Insulin this am prior to dialysis  Anemia history of GI bleed   -   Coumadin given for prevention of CVA  Bones will check PTH  Volume  Diastolic dysfunction with what appears relatively compensated    LOS: 1 Ayyub Krall W @TODAY @7 :18 AM

## 2014-09-17 NOTE — Progress Notes (Signed)
ANTICOAGULATION CONSULT NOTE - Initial Consult  Pharmacy Consult for Coumadin Indication: atrial fibrillation  Allergies  Allergen Reactions  . Amiodarone Hives  . Propylthiouracil Rash    Patient Measurements: Height: 5\' 7"  (170.2 cm) Weight: 179 lb 10.8 oz (81.5 kg) IBW/kg (Calculated) : 61.6  Vital Signs: Temp: 98 F (36.7 C) (02/03 0236) Temp Source: Oral (02/03 0236) BP: 139/44 mmHg (02/03 0236) Pulse Rate: 63 (02/03 0236)  Labs:  Recent Labs  09/16/14 1938 09/16/14 2104 09/17/14 0058  HGB 10.6*  --   --   HCT 32.2*  --   --   PLT 199  --   --   LABPROT 48.4*  --   --   INR 5.32*  --   --   CREATININE 2.96* 2.97* 3.14*    Estimated Creatinine Clearance: 16.5 mL/min (by C-G formula based on Cr of 3.14).   Medical History: Past Medical History  Diagnosis Date  . Cellulitis and abscess of unspecified site     a. h/o MSRA cellulitis of abdominal wall.  Marland Kitchen CAD (coronary artery disease)     a. DES to OM 01/2005. b. Rotational atherotomy/DES to prox LAD 02/2008. c. Cath 07/2013: stable, for med rx.  . Edema   . Warfarin anticoagulation     a. Followed by PCP.  Marland Kitchen GI bleed     a. EGD 01/2007: antral ulcers related to NSAID/aspirin use.  . Peripheral vascular disease   . Hyperthyroidism     a. Hx of intolerance to PTU.  Marland Kitchen Blood transfusion years ago    "heart problems" (09/26/2013)  . H/O hiatal hernia   . Umbilical hernia     unrepaired (11/24/11)  . Valvular heart disease     Echo 11/2011: mild-mod MR.  Marland Kitchen Hypertension     takes Amlodipine,Hydralazine,and Imdur daily  . Hyperlipidemia     takes Lovastatin daily  . SSS (sick sinus syndrome)     a. H/o AV node ablation 2008 secondary to difficult to control afib/flutter, with subsequent pacer.  . Atrial fibrillation with rapid ventricular response     a. H/o amiodarone thyroiditis. b. s/p AV node ablation 2008 with implantation of PPM 01/2007, upgraded to CRT-P 06/2007.;takes Coumadin daily  . GERD  (gastroesophageal reflux disease)     takes Protonix daily  . Chronic diastolic CHF (congestive heart failure)     takes Lasix daily and Spironolactone   . Pacemaker     Medtronic  . History of gout   . Hemorrhoids   . Depression with anxiety     takes Xanax daily  . Insomnia     takes Xanax if needed  . Heart murmur   . CKD (chronic kidney disease)     Cr ~2.3.  . Kidney stones   . Myocardial infarction     "I've had 2-3; last one was in /1996" (09/26/2013)  . Migraines     "last one was a pretty good while ago" (09/26/2013)  . Arthritis     "all over" (09/26/2013)  . Chronic lower back pain     deteriorating disc  . Iron deficiency anemia     takes an Iron Pill daily  . Pneumonia     Medications:  Prescriptions prior to admission  Medication Sig Dispense Refill Last Dose  . ALPRAZolam (XANAX) 0.5 MG tablet Take 1 tablet (0.5 mg total) by mouth at bedtime as needed for anxiety or sleep. 30 tablet 0 09/15/2014 at Unknown time  . colchicine 0.6 MG  tablet Take 1 tablet (0.6 mg total) by mouth 2 (two) times daily as needed (Gout). 60 tablet 0 unknown at unknown  . ergocalciferol (VITAMIN D2) 50000 UNITS capsule Take 1 capsule (50,000 Units total) by mouth every Saturday. Take on saturdays 10 capsule 0 Past Week at Unknown time  . Fluticasone-Salmeterol (ADVAIR DISKUS) 100-50 MCG/DOSE AEPB Inhale 1 puff into the lungs 2 (two) times daily. 1 each 2 09/15/2014 at Unknown time  . furosemide (LASIX) 80 MG tablet Take 1 tablet (80 mg total) by mouth 2 (two) times daily. 60 tablet 1 09/15/2014 at Unknown time  . hydrALAZINE (APRESOLINE) 25 MG tablet Take 1 tablet (25 mg total) by mouth 3 (three) times daily. 90 tablet 1 09/15/2014 at Unknown time  . isosorbide mononitrate (IMDUR) 60 MG 24 hr tablet Take 2 tablets (120 mg total) by mouth daily. 60 tablet 3 09/15/2014 at Unknown time  . loratadine (CLARITIN) 10 MG tablet Take 1 tablet (10 mg total) by mouth daily. 30 tablet 1 09/15/2014 at Unknown time   . lovastatin (MEVACOR) 10 MG tablet Take 1 tablet (10 mg total) by mouth at bedtime. 30 tablet 2 09/15/2014 at Unknown time  . magnesium oxide (MAG-OX) 400 MG tablet Take 1 tablet (400 mg total) by mouth at bedtime. 30 tablet 0 09/15/2014 at Unknown time  . metolazone (ZAROXOLYN) 2.5 MG tablet Take 1 tablet (2.5 mg total) by mouth 2 (two) times a week. Monday and Friday 30 tablet 1 09/15/2014 at Unknown time  . nitroGLYCERIN (NITROSTAT) 0.4 MG SL tablet Place 1 tablet (0.4 mg total) under the tongue every 5 (five) minutes as needed for chest pain. 30 tablet 12 unknown at unknown  . ondansetron (ZOFRAN) 4 MG tablet Take 1 tablet (4 mg total) by mouth every 8 (eight) hours as needed for nausea or vomiting. 20 tablet 0 09/15/2014 at Unknown time  . oxyCODONE-acetaminophen (PERCOCET/ROXICET) 5-325 MG per tablet Take 1 tablet by mouth every 6 (six) hours as needed for severe pain. 30 tablet 0 09/16/2014 at Unknown time  . pantoprazole (PROTONIX) 40 MG tablet Take 1 tablet (40 mg total) by mouth 2 (two) times daily. 60 tablet 3 09/15/2014 at Unknown time  . potassium chloride SA (K-DUR,KLOR-CON) 20 MEQ tablet Take 2 tablets (40 mEq total) by mouth 2 (two) times daily. 60 tablet 0 09/16/2014 at Unknown time  . warfarin (COUMADIN) 1 MG tablet Take 1 tablet (1 mg total) by mouth daily at 6 PM. 30 tablet 2 09/15/2014 at Unknown time  . camphor-menthol (SARNA) lotion Apply topically as needed for itching. (Patient taking differently: Apply 1 application topically daily as needed for itching. ) 222 mL 0 Taking  . levothyroxine (SYNTHROID, LEVOTHROID) 100 MCG tablet Take 1 tablet (100 mcg total) by mouth daily before breakfast. (Patient not taking: Reported on 09/16/2014) 30 tablet 2 Not Taking at Unknown time  . promethazine (PHENERGAN) 25 MG tablet Take 1 tablet (25 mg total) by mouth every 8 (eight) hours as needed for nausea or vomiting. (Patient not taking: Reported on 09/16/2014) 30 tablet 1 Not Taking at Unknown time    Scheduled:  . albuterol      . colchicine  0.6 mg Oral BID  . feeding supplement (ENSURE COMPLETE)  237 mL Oral BID BM  . furosemide  80 mg Oral BID  . hydrALAZINE  25 mg Oral TID  . isosorbide mononitrate  120 mg Oral Daily  . mometasone-formoterol  2 puff Inhalation BID  . pantoprazole  40 mg  Oral BID  . sodium chloride  3 mL Intravenous Q12H   Infusions:  .  sodium bicarbonate  infusion 1000 mL 50 mL/hr at 09/17/14 0155    Assessment: 80yo female c/o LUE pain and swelling x4d, found to be hyperkalemic in ED, to continue Coumadin for Afib during admission; INR 2/2 was above goal, last Coumadin dose PTA was on 2/1.  Goal of Therapy:  INR 2-3   Plan:  Will hold Coumadin and monitor INR to resume and adjust dosing.  Wynona Neat, PharmD, BCPS  09/17/2014,3:33 AM

## 2014-09-17 NOTE — Progress Notes (Signed)
INITIAL NUTRITION ASSESSMENT  DOCUMENTATION CODES Per approved criteria  -Severe malnutrition in the context of chronic illness   Pt meets criteria for severe MALNUTRITION in the context of chronic illness as evidenced by 5% weight loss in past month, moderate fat loss and severe muscle wasting.   INTERVENTION: -Magic Cup BID with meals providing 290 kcals and 9 g protein -Continue to monitor PO intake  NUTRITION DIAGNOSIS: Inadequate oral intake related to poor appetite as evidenced by pt report and 5% weight loss in past month.   Goal: Pt to meet >/=90% of estimated needs through meals and supplements  Monitor:  PO intake, weight trends, labs  Reason for Assessment: MST = 3  80 y.o. female  Admitting Dx: Hyperkalemia  ASSESSMENT: Pt admitted with left arm pain and swelling.  Pt hx of CKD Stage IV, CAD, GI bleeding, A. Fib, CHF.  Pt currently in Hemodialysis.   Pt reports spending two weeks in a facility and did not enjoy the food so she did not eat much, she has been living with her sister since and says she has not been able to eat much. Pt has not noticed a weight loss but records show a 5% wt loss in the past month (significant for time frame) and weight was trending down before that.  Pt reports Nepro gives her diarrhea but willing to try a magic cup.   Nutrition Focused Physical Exam:  Subcutaneous Fat:  Orbital Region: moderate depletion Upper Arm Region: moderate depletion Thoracic and Lumbar Region: n/a  Muscle:  Temple Region: severe depletion Clavicle Bone Region: severe depletion Clavicle and Acromion Bone Region: severe depletion Scapular Bone Region: n/a Dorsal Hand: severe depletion Patellar Region: well nourished Anterior Thigh Region: well nourished Posterior Calf Region: well nourished  Edema: not present    Height: Ht Readings from Last 1 Encounters:  09/17/14 5\' 7"  (1.702 m)    Weight: Wt Readings from Last 1 Encounters:  09/17/14 181  lb 7 oz (82.3 kg)    Ideal Body Weight: 135 lbs  % Ideal Body Weight: 134%  Wt Readings from Last 10 Encounters:  09/17/14 181 lb 7 oz (82.3 kg)  09/16/14 178 lb 12.8 oz (81.103 kg)  08/13/14 182 lb 8.7 oz (82.8 kg)  07/23/14 190 lb 4.1 oz (86.3 kg)  05/19/14 198 lb (89.812 kg)  05/12/14 200 lb 1.6 oz (90.765 kg)  04/25/14 207 lb 9.6 oz (94.167 kg)  03/03/14 201 lb (91.173 kg)  01/27/14 204 lb (92.534 kg)  01/20/14 199 lb (90.266 kg)    Usual Body Weight: 250 lbs  % Usual Body Weight: 72%  BMI:  Body mass index is 28.41 kg/(m^2).  Estimated Nutritional Needs: Kcal: 1900-2100 kcal  Protein: 100-115 g protein Fluid: 1.2 L day  Skin: Skin tear on left arm  Diet Order: Diet renal W/1262mL fluid restriction  EDUCATION NEEDS: -No education needs identified at this time   Intake/Output Summary (Last 24 hours) at 09/17/14 0948 Last data filed at 09/17/14 3762  Gross per 24 hour  Intake    100 ml  Output     78 ml  Net     22 ml    Last BM: PTA   Labs:   Recent Labs Lab 09/17/14 0058 09/17/14 0523 09/17/14 0817 09/17/14 0917  NA 131* 133* 131* 137  K 6.7* >7.5* 5.5* 4.2  CL 105 108 103 101  CO2 19 17* 20  --   BUN 36* 39* 35* 24*  CREATININE 3.14*  3.19* 3.16* 2.10*  CALCIUM 9.0 8.5 8.7  --   PHOS  --   --  2.9  --   GLUCOSE 89  88 112* 164* 90    CBG (last 3)  No results for input(s): GLUCAP in the last 72 hours.  Scheduled Meds: . albuterol      . colchicine  0.6 mg Oral BID  . darbepoetin (ARANESP) injection - DIALYSIS  100 mcg Intravenous Q Wed-HD  . feeding supplement (ENSURE COMPLETE)  237 mL Oral BID BM  . hydrALAZINE  25 mg Oral TID  . isosorbide mononitrate  120 mg Oral Daily  . mometasone-formoterol  2 puff Inhalation BID  . pantoprazole  40 mg Oral BID  . sodium chloride  3 mL Intravenous Q12H  . sodium polystyrene  60 g Oral Once  . Warfarin - Pharmacist Dosing Inpatient   Does not apply q1800    Continuous Infusions: .  sodium  bicarbonate  infusion 1000 mL 50 mL/hr at 09/17/14 0155    Past Medical History  Diagnosis Date  . Cellulitis and abscess of unspecified site     a. h/o MSRA cellulitis of abdominal wall.  Marland Kitchen CAD (coronary artery disease)     a. DES to OM 01/2005. b. Rotational atherotomy/DES to prox LAD 02/2008. c. Cath 07/2013: stable, for med rx.  . Edema   . Warfarin anticoagulation     a. Followed by PCP.  Marland Kitchen GI bleed     a. EGD 01/2007: antral ulcers related to NSAID/aspirin use.  . Peripheral vascular disease   . Hyperthyroidism     a. Hx of intolerance to PTU.  Marland Kitchen Blood transfusion years ago    "heart problems" (09/26/2013)  . H/O hiatal hernia   . Umbilical hernia     unrepaired (11/24/11)  . Valvular heart disease     Echo 11/2011: mild-mod MR.  Marland Kitchen Hypertension     takes Amlodipine,Hydralazine,and Imdur daily  . Hyperlipidemia     takes Lovastatin daily  . SSS (sick sinus syndrome)     a. H/o AV node ablation 2008 secondary to difficult to control afib/flutter, with subsequent pacer.  . Atrial fibrillation with rapid ventricular response     a. H/o amiodarone thyroiditis. b. s/p AV node ablation 2008 with implantation of PPM 01/2007, upgraded to CRT-P 06/2007.;takes Coumadin daily  . GERD (gastroesophageal reflux disease)     takes Protonix daily  . Chronic diastolic CHF (congestive heart failure)     takes Lasix daily and Spironolactone   . Pacemaker     Medtronic  . History of gout   . Hemorrhoids   . Depression with anxiety     takes Xanax daily  . Insomnia     takes Xanax if needed  . Heart murmur   . CKD (chronic kidney disease)     Cr ~2.3.  . Kidney stones   . Myocardial infarction     "I've had 2-3; last one was in /1996" (09/26/2013)  . Migraines     "last one was a pretty good while ago" (09/26/2013)  . Arthritis     "all over" (09/26/2013)  . Chronic lower back pain     deteriorating disc  . Iron deficiency anemia     takes an Iron Pill daily  . Pneumonia     Past  Surgical History  Procedure Laterality Date  . Esophagogastroduodenoscopy    . Tonsillectomy  ~ 1960  . Laminectomy and microdiscectomy lumbar spine  08/2003  .  Cardioversion  10/2004; 02/2007    /E-chart  . Cholecystectomy    . Appendectomy  1954  . Vaginal hysterectomy  ?1981  . Av node ablation  02/2007    /E-chart  . Insert / replace / remove pacemaker  01/2007    initial placement  . Insert / replace / remove pacemaker  06/2007    upgraded/E-chart  . Av fistula placement, brachiocephalic Right 11/4032    "didn't work; my veins were too small"  . Joint replacement    . Total knee arthroplasty Left 01/2009  . Bladder and bowel  1970's    "tacked; damaged my intestines & had to remove some; done @ [facility]  . Cataract extraction w/ intraocular lens  implant, bilateral Bilateral   . Bascilic vein transposition Right 06/21/2013    Procedure: [place];  Surgeon: Serafina Mitchell, MD;  Location: [place] OR;  Service: Vascular;  Laterality: Right;  . Esophagogastroduodenoscopy N/A 07/19/2013    Procedure: ESOPHAGOGASTRODUODENOSCOPY (EGD);  Surgeon: Lafayette Dragon, MD;  Location: [facility] ENDOSCOPY;  Service: Endoscopy;  Laterality: N/A;  . Balloon dilation N/A 07/19/2013    Procedure: BALLOON DILATION;  Surgeon: Lafayette Dragon, MD;  Location: [facility] ENDOSCOPY;  Service: Endoscopy;  Laterality: N/A;  . Esophagogastroduodenoscopy N/A 07/20/2013    Procedure: ESOPHAGOGASTRODUODENOSCOPY (EGD);  Surgeon: Lafayette Dragon, MD;  Location: [facility] ENDOSCOPY;  Service: Endoscopy;  Laterality: N/A;  with removal of polyp in pylorus  . Bascilic vein transposition Right 09/20/2013    Procedure: 2ND STAGE RIGHT BASCILIC VEIN TRANSPOSITION; ULTRSOUND GUIDED;  Surgeon: Serafina Mitchell, MD;  Location: [facility] OR;  Service: Vascular;  Laterality: Right;  . Cardiac catheterization  2006; 2008; 2009; 01/13/2011  . Coronary angioplasty with stent placement  ~ 2007; ?    "had a total of 2 stents put in" 92/07/2014)   . Back surgery    . Left heart catheterization with coronary angiogram N/A 07/15/2013    Procedure: LEFT HEART CATHETERIZATION WITH CORONARY ANGIOGRAM;  Surgeon: Blane Ohara, MD;  Location: [facility] CATH LAB;  Service: Cardiovascular;  Laterality: N/A;  . Esophagogastroduodenoscopy N/A 07/25/2014    Procedure: ESOPHAGOGASTRODUODENOSCOPY (EGD);  Surgeon: Irene Shipper, MD;  Location: [facility] ENDOSCOPY;  Service: Endoscopy;  Laterality: N/A;  . Esophagogastroduodenoscopy N/A 08/09/2014    Procedure: ESOPHAGOGASTRODUODENOSCOPY (EGD);  Surgeon: Inda Castle, MD;  Location: [facility] ENDOSCOPY;  Service: Endoscopy;  Laterality: N/A;  Possible enteroscopy  . Givens capsule study N/A 08/11/2014    Procedure: GIVENS CAPSULE STUDY;  Surgeon: Inda Castle, MD;  Location: [facility] ENDOSCOPY;  Service: Endoscopy;  Laterality: N/A;  . Colonoscopy  last 2011    Elmer Picker MS Dietetic Intern Pager Number [phone]

## 2014-09-17 NOTE — Progress Notes (Signed)
Patient Demographics  [name], is a 80 y.o. female, DOB - [birth year], EGB:[id number]  Admit date - 09/16/2014   Admitting Physician Berle Mull, MD  Outpatient Primary MD for the patient is Tamsen Roers, MD  LOS - 1   Chief Complaint  Patient presents with  . Arm Pain      Admission history of present illness/brief narrative:  [name] is a 80 y.o. female with Past medical history of chronic kidney disease, coronary artery disease, GI bleeding, atrial fibrillation on warfarin, hypothyroidism, valvular heart disease, chronic diastolic heart failure. The patient is presenting with complaints of of left arm swelling. She has a right arm fistula placed in 2014, It was discovered that she had an elevated potassium more than 7.5 ,that was treated appropriately in The ER with minimal improvement, although she did have some rebound or her hyperkalemia t. EKG does demonstrated a paced rhythm and some peaking of the T waves. Patient was started on hemodialysis emergently by nephrology service.  Subjective:   [name] today has, No headache, No chest pain, No abdominal pain - No Nausea, No new weakness tingling or numbness, No Cough - SOB.  Assessment & Plan    Principal Problem:   Hyperkalemia Active Problems:   Anemia   Essential hypertension   CAD, NATIVE VESSEL   Gastric ulcer (2008)   PACEMAKER-Medtronic   CKD (chronic kidney disease) stage 4, GFR 15-29 ml/min   Supratherapeutic INR   Other specified hypothyroidism   Gout    Hyperkalemia -The patient is presenting with complaints of left arm swelling and pain. On further workup she was found to have significant hyperkalemia which was rechecked and verified. -With her significantly elevated potassium level with failure of conservative management with Kayexalate and sodium bicarbonate as  well as insulin and dextrose. -Monitor in stepdown. - Shunt had emergent dialysis today 09/17/14 -Repeat labs in a.m.   Chronic kidney disease. Stage V - Patient started on hemodialysis by nephrology.   History of A. fib -Supratherapeutic INR, a flutter.- - Pharmacy will be managing warfarin dosing. -No active bleeding. - Patient recently was admitted for GI bleeding.   Chronic diastolic heart failure. Patient is on Lasix. At present I'll continue it. Holding metolazone.   Left arm swelling. Patient presented with left arm swelling and redness. Initially there was suspicion for cellulitis. Although patient does not have any fever nor has a leukocytosis. Patient has history of gout and this appears of possible gout attack.  on colchicine  Code Status: full  Family Communication: None at bedside  Disposition Plan: Remains in stepdown   Procedures  None   Consults   Nephrology   Medications  Scheduled Meds: . colchicine  0.6 mg Oral BID  . Darbepoetin Alfa      . darbepoetin (ARANESP) injection - DIALYSIS  100 mcg Intravenous Q Wed-HD  . feeding supplement (ENSURE COMPLETE)  237 mL Oral BID BM  . hydrALAZINE  25 mg Oral TID  . isosorbide mononitrate  120 mg Oral Daily  . mometasone-formoterol  2 puff Inhalation BID  . pantoprazole  40 mg Oral BID  . sodium chloride  3 mL Intravenous Q12H  . sodium polystyrene  60 g Oral Once  . Warfarin -  Pharmacist Dosing Inpatient   Does not apply q1800   Continuous Infusions: .  sodium bicarbonate  infusion 1000 mL 50 mL/hr at 09/17/14 0155   PRN Meds:.acetaminophen **OR** acetaminophen, ALPRAZolam, nitroGLYCERIN, ondansetron **OR** ondansetron (ZOFRAN) IV, oxyCODONE-acetaminophen  DVT Prophylaxis on warfarin  Lab Results  Component Value Date   PLT 182 09/17/2014    Antibiotics    Anti-infectives    None          Objective:   Filed Vitals:   09/17/14 0830 09/17/14 0900 09/17/14 1005 09/17/14 1214  BP:  161/58 152/60 172/64 149/58  Pulse: 60 60 60 64  Temp:   97.9 F (36.6 C) 97.8 F (36.6 C)  TempSrc:   Oral Oral  Resp: 15 14 15 21   Height:      Weight:   80.7 kg (177 lb 14.6 oz)   SpO2:   98% 99%    Wt Readings from Last 3 Encounters:  09/17/14 80.7 kg (177 lb 14.6 oz)  09/16/14 81.103 kg (178 lb 12.8 oz)  08/13/14 82.8 kg (182 lb 8.7 oz)     Intake/Output Summary (Last 24 hours) at 09/17/14 1550 Last data filed at 09/17/14 1300  Gross per 24 hour  Intake    200 ml  Output   1578 ml  Net  -1378 ml     Physical Exam  Awake Alert, Oriented X 3, No new F.N deficits, Normal affect [place].AT,PERRAL Supple Neck,No JVD, No cervical lymphadenopathy appriciated.  Symmetrical Chest wall movement, Good air movement bilaterally, CTAB RRR,No Gallops,Rubs or new Murmurs, No Parasternal Heave +ve B.Sounds, Abd Soft, No tenderness, No organomegaly appriciated, No rebound - guarding or rigidity. No Cyanosis, Clubbing or edema, No new Rash or bruise     Data Review   Micro Results Recent Results (from the past 240 hour(s))  MRSA PCR Screening     Status: None   Collection Time: 09/17/14  4:14 AM  Result Value Ref Range Status   MRSA by PCR NEGATIVE NEGATIVE Final    Comment:        The GeneXpert MRSA Assay (FDA approved for NASAL specimens only), is one component of a comprehensive MRSA colonization surveillance program. It is not intended to diagnose MRSA infection nor to guide or monitor treatment for MRSA infections.     Radiology Reports Dg Hand Complete Left  09/16/2014   CLINICAL DATA:  Left hand pain and swelling, along the dorsum of the hand. Initial encounter.  EXAM: LEFT HAND - COMPLETE 3+ VIEW  COMPARISON:  None.  FINDINGS: There is no definite evidence of fracture or dislocation. Multiple erosions are noted at the carpal rows, relatively focal along the mid and distal scaphoid. Degenerative change is noted at the first carpometacarpal joint, with chronic  subluxation an underlying osteophyte formation. There is calcification of the triangular fibrocartilage.  Diffuse dorsal soft tissue swelling is noted, without evidence of associated fracture. Mild chronic deformity is noted at the base of the fifth metacarpal, likely reflecting remote injury.  IMPRESSION: 1. No definite evidence of fracture or dislocation. 2. Multiple erosions of the carpal rows, reflecting underlying erosive arthropathy. 3. Degenerative change at the first carpometacarpal joint likely reflects osteoarthritis. 4. Calcification of the triangular fibrocartilage. 5. Diffuse dorsal soft tissue swelling noted.   Electronically Signed   By: Garald Balding M.D.   On: 09/16/2014 21:26    CBC  Recent Labs Lab 09/16/14 1938 09/17/14 0523 09/17/14 0817 09/17/14 0917  WBC 4.3 5.2 4.0  --  HGB 10.6* 8.8* 8.9* 10.2*  HCT 32.2* 27.0* 27.6* 30.0*  PLT 199 189 182  --   MCV 88.2 87.7 89.3  --   MCH 29.0 28.6 28.8  --   MCHC 32.9 32.6 32.2  --   RDW 16.2* 16.3* 16.2*  --   LYMPHSABS 0.4* 0.4*  --   --   MONOABS 0.4 0.5  --   --   EOSABS 0.2 0.1  --   --   BASOSABS 0.0 0.0  --   --     Chemistries   Recent Labs Lab 09/16/14 1938 09/16/14 2104 09/17/14 0058 09/17/14 0523 09/17/14 0817 09/17/14 0917  NA 130* 130* 131* 133* 131* 137  K >7.5* 7.4* 6.7* >7.5* 5.5* 4.2  CL 104 104 105 108 103 101  CO2 19 19 19  17* 20  --   GLUCOSE 108* 100* 89  88 112* 164* 90  BUN 36* 37* 36* 39* 35* 24*  CREATININE 2.96* 2.97* 3.14* 3.19* 3.16* 2.10*  CALCIUM 8.9 8.6 9.0 8.5 8.7  --    ------------------------------------------------------------------------------------------------------------------ estimated creatinine clearance is 24.5 mL/min (by C-G formula based on Cr of 2.1). ------------------------------------------------------------------------------------------------------------------ No results for input(s): HGBA1C in the last 72  hours. ------------------------------------------------------------------------------------------------------------------ No results for input(s): CHOL, HDL, LDLCALC, TRIG, CHOLHDL, LDLDIRECT in the last 72 hours. ------------------------------------------------------------------------------------------------------------------ No results for input(s): TSH, T4TOTAL, T3FREE, THYROIDAB in the last 72 hours.  Invalid input(s): FREET3 ------------------------------------------------------------------------------------------------------------------ No results for input(s): VITAMINB12, FOLATE, FERRITIN, TIBC, IRON, RETICCTPCT in the last 72 hours.  Coagulation profile  Recent Labs Lab 09/16/14 1938 09/17/14 0523  INR 5.32* 5.99*    No results for input(s): DDIMER in the last 72 hours.  Cardiac Enzymes No results for input(s): CKMB, TROPONINI, MYOGLOBIN in the last 168 hours.  Invalid input(s): CK ------------------------------------------------------------------------------------------------------------------ Invalid input(s): POCBNP     Time Spent in minutes   25 minutes   Ervine Witucki M.D on 09/17/2014 at 3:50 PM  Between 7am to 7pm - Pager - [phone]  After 7pm go to [email or web address] - password [email or web address]  And look for the night coverage person covering for me after hours  [facility] Office  [phone]   **Disclaimer: This note may have been dictated with voice recognition software. Similar sounding words can inadvertently be transcribed and this note may contain transcription errors which may not have been corrected upon publication of note.**

## 2014-09-17 NOTE — Progress Notes (Signed)
Pt transferred from the ED around 0230, admitted to Rm/3s15. Pt comes from home with son. She is alert and oriented, non-ambulatory d/t generalized weakness. Skin tear to LUE, generalized edema most significant to LUE. Placed on telemetry, paced rhythm. Oriented to room, instructed to call for assistance before getting out of bed. Resting comfortably at this time, will continue to monitor

## 2014-09-17 NOTE — Progress Notes (Signed)
Utilization review completed.  

## 2014-09-17 NOTE — Procedures (Signed)
I have seen and examined this patient and agree with the plan of care  Patient seen on dialysis stable 1st treatment   Life threatening hyperkalemia    [name] W 09/17/2014, 7:47 AM

## 2014-09-18 DIAGNOSIS — M7989 Other specified soft tissue disorders: Secondary | ICD-10-CM

## 2014-09-18 LAB — CBC
HCT: 29.4 % — ABNORMAL LOW (ref 36.0–46.0)
HEMOGLOBIN: 9.5 g/dL — AB (ref 12.0–15.0)
MCH: 28.2 pg (ref 26.0–34.0)
MCHC: 32.3 g/dL (ref 30.0–36.0)
MCV: 87.2 fL (ref 78.0–100.0)
PLATELETS: 184 10*3/uL (ref 150–400)
RBC: 3.37 MIL/uL — ABNORMAL LOW (ref 3.87–5.11)
RDW: 16.2 % — ABNORMAL HIGH (ref 11.5–15.5)
WBC: 4.6 10*3/uL (ref 4.0–10.5)

## 2014-09-18 LAB — RENAL FUNCTION PANEL
ALBUMIN: 2.2 g/dL — AB (ref 3.5–5.2)
Anion gap: 8 (ref 5–15)
BUN: 24 mg/dL — ABNORMAL HIGH (ref 6–23)
CALCIUM: 8.1 mg/dL — AB (ref 8.4–10.5)
CO2: 24 mmol/L (ref 19–32)
Chloride: 101 mmol/L (ref 96–112)
Creatinine, Ser: 2.5 mg/dL — ABNORMAL HIGH (ref 0.50–1.10)
GFR, EST AFRICAN AMERICAN: 20 mL/min — AB (ref >90)
GFR, EST NON AFRICAN AMERICAN: 17 mL/min — AB (ref >90)
Glucose, Bld: 101 mg/dL — ABNORMAL HIGH (ref 70–99)
POTASSIUM: 5.3 mmol/L — AB (ref 3.5–5.1)
Phosphorus: 3 mg/dL (ref 2.3–4.6)
Sodium: 133 mmol/L — ABNORMAL LOW (ref 135–145)

## 2014-09-18 LAB — HEPATITIS B CORE ANTIBODY, TOTAL: Hep B Core Total Ab: NEGATIVE — AB

## 2014-09-18 LAB — PROTIME-INR
INR: 4.66 — AB (ref 0.00–1.49)
PROTHROMBIN TIME: 44.3 s — AB (ref 11.6–15.2)

## 2014-09-18 LAB — PARATHYROID HORMONE, INTACT (NO CA): PTH: 34 pg/mL (ref 15–65)

## 2014-09-18 LAB — HEPATITIS B SURFACE ANTIBODY,QUALITATIVE: Hep B S Ab: NONREACTIVE — AB

## 2014-09-18 MED ORDER — NEPRO/CARBSTEADY PO LIQD
237.0000 mL | Freq: Two times a day (BID) | ORAL | Status: DC
  Filled 2014-09-18 (×5): qty 237

## 2014-09-18 MED ORDER — SODIUM CHLORIDE 0.9 % IV SOLN
1020.0000 mg | Freq: Once | INTRAVENOUS | Status: AC
  Administered 2014-09-18: 1020 mg via INTRAVENOUS
  Filled 2014-09-18: qty 34

## 2014-09-18 MED ORDER — FUROSEMIDE 80 MG PO TABS
80.0000 mg | ORAL_TABLET | Freq: Two times a day (BID) | ORAL | Status: DC
  Administered 2014-09-18 – 2014-09-19 (×3): 80 mg via ORAL
  Filled 2014-09-18 (×5): qty 1

## 2014-09-18 NOTE — Progress Notes (Signed)
[place] for Coumadin Indication: atrial fibrillation  Allergies  Allergen Reactions  . Amiodarone Hives  . Propylthiouracil Rash    Patient Measurements: Height: 5\' 7"  (170.2 cm) Weight: 179 lb 14.3 oz (81.6 kg) IBW/kg (Calculated) : 61.6  Vital Signs: Temp: 98.3 F (36.8 C) (02/04 0735) Temp Source: Oral (02/04 0735) BP: 149/47 mmHg (02/04 1105) Pulse Rate: 60 (02/04 1105)  Labs:  Recent Labs  09/16/14 1938  09/17/14 0523 09/17/14 0817 09/17/14 0917 09/18/14 0307  HGB 10.6*  --  8.8* 8.9* 10.2* 9.5*  HCT 32.2*  --  27.0* 27.6* 30.0* 29.4*  PLT 199  --  189 182  --  184  LABPROT 48.4*  --  53.9*  --   --  44.3*  INR 5.32*  --  5.99*  --   --  4.66*  CREATININE 2.96*  < > 3.19* 3.16* 2.10* 2.50*  < > = values in this interval not displayed.  Estimated Creatinine Clearance: 20.7 mL/min (by C-G formula based on Cr of 2.5).   Assessment: 80yo female c/o LUE pain and swelling x4d, found to be hyperkalemic in ED, to continue Coumadin for Afib during admission; INR 2/2 was above goal, last Coumadin dose PTA was on 2/1.  INR still elevated at 4.66  Goal of Therapy:  INR 2-3   Plan:  Will hold Coumadin and monitor INR to resume and adjust dosing.  Thank you. Anette Guarneri, PharmD [phone] 09/18/2014,11:55 AM

## 2014-09-18 NOTE — Progress Notes (Signed)
Report given to [facility] on 3W.

## 2014-09-18 NOTE — Clinical Documentation Improvement (Signed)
Possible Clinical Conditions?  Severe Malnutrition   Protein Calorie Malnutrition Severe Protein Calorie Malnutrition Other Condition Cannot clinically determine  Supporting Information: INITIAL NUTRITION ASSESSMENT by Molli Barrows, RD, LDN, CNSC  DOCUMENTATION CODES  Per approved criteria   -Severe malnutrition in the context of chronic illness    Pt meets criteria for severe MALNUTRITION in the context of chronic illness as evidenced by 5% weight loss in past month, moderate fat loss and severe muscle wasting.   INTERVENTION:  -Magic Cup BID with meals providing 290 kcals and 9 g protein  -Continue to monitor PO intake   NUTRITION DIAGNOSIS:  Inadequate oral intake related to poor appetite as evidenced by pt report and 5% weight loss in past month.   Thank You, Alessandra Grout, RN, BSN, CCDS,Clinical Documentation Specialist:  [phone]  [phone]=[employer] Information Management

## 2014-09-18 NOTE — Progress Notes (Signed)
S: Has an appetite.  Chronic weakness in legs.  PT was coming to her home O:BP 132/55 mmHg  Pulse 60  Temp(Src) 98.3 F (36.8 C) (Oral)  Resp 17  Ht 5\' 7"  (1.702 m)  Wt 81.6 kg (179 lb 14.3 oz)  BMI 28.17 kg/m2  SpO2 97%  Intake/Output Summary (Last 24 hours) at 09/18/14 0834 Last data filed at 09/18/14 0600  Gross per 24 hour  Intake  757.5 ml  Output   1865 ml  Net -1107.5 ml   Weight change: 0.8 kg (1 lb 12.2 oz) YHC:WCBJS and alert EGB:TDVVO, irreg Resp:few basilar crackles Abd:+ BS NDNT Ext: trace edema  RUA AVF + bruit.  + edema Lt arm NEURO:CNI Ox3 no asterixis   . colchicine  0.6 mg Oral BID  . darbepoetin (ARANESP) injection - DIALYSIS  100 mcg Intravenous Q Wed-HD  . feeding supplement (ENSURE COMPLETE)  237 mL Oral BID BM  . hydrALAZINE  25 mg Oral TID  . isosorbide mononitrate  120 mg Oral Daily  . mometasone-formoterol  2 puff Inhalation BID  . pantoprazole  40 mg Oral BID  . sodium chloride  3 mL Intravenous Q12H  . Warfarin - Pharmacist Dosing Inpatient   Does not apply q1800   Dg Hand Complete Left  09/16/2014   CLINICAL DATA:  Left hand pain and swelling, along the dorsum of the hand. Initial encounter.  EXAM: LEFT HAND - COMPLETE 3+ VIEW  COMPARISON:  None.  FINDINGS: There is no definite evidence of fracture or dislocation. Multiple erosions are noted at the carpal rows, relatively focal along the mid and distal scaphoid. Degenerative change is noted at the first carpometacarpal joint, with chronic subluxation an underlying osteophyte formation. There is calcification of the triangular fibrocartilage.  Diffuse dorsal soft tissue swelling is noted, without evidence of associated fracture. Mild chronic deformity is noted at the base of the fifth metacarpal, likely reflecting remote injury.  IMPRESSION: 1. No definite evidence of fracture or dislocation. 2. Multiple erosions of the carpal rows, reflecting underlying erosive arthropathy. 3. Degenerative change at  the first carpometacarpal joint likely reflects osteoarthritis. 4. Calcification of the triangular fibrocartilage. 5. Diffuse dorsal soft tissue swelling noted.   Electronically Signed   By: Garald Balding M.D.   On: 09/16/2014 21:26   BMET    Component Value Date/Time   NA 133* 09/18/2014 0307   K 5.3* 09/18/2014 0307   CL 101 09/18/2014 0307   CO2 24 09/18/2014 0307   GLUCOSE 101* 09/18/2014 0307   BUN 24* 09/18/2014 0307   CREATININE 2.50* 09/18/2014 0307   CALCIUM 8.1* 09/18/2014 0307   CALCIUM 8.7 03/02/2007 0505   GFRNONAA 17* 09/18/2014 0307   GFRAA 20* 09/18/2014 0307   CBC    Component Value Date/Time   WBC 4.6 09/18/2014 0307   RBC 3.37* 09/18/2014 0307   HGB 9.5* 09/18/2014 0307   HCT 29.4* 09/18/2014 0307   PLT 184 09/18/2014 0307   MCV 87.2 09/18/2014 0307   MCH 28.2 09/18/2014 0307   MCHC 32.3 09/18/2014 0307   RDW 16.2* 09/18/2014 0307   LYMPHSABS 0.4* 09/17/2014 0523   MONOABS 0.5 09/17/2014 0523   EOSABS 0.1 09/17/2014 0523   BASOSABS 0.0 09/17/2014 0523     Assessment: 1. Hyperkalemia, improved 2. CKD 4  (baseline Scr upper 2's, follows Dr Marval Regal) 3.anemia and Iron def   Plan: 1. IV iron 2. Resume lasix 3. PT to see 4. Daily labs  Fran Neiswonger T

## 2014-09-18 NOTE — Progress Notes (Addendum)
VASCULAR LAB PRELIMINARY  PRELIMINARY  PRELIMINARY  PRELIMINARY  Left upper extremity venous duplex completed.    Preliminary report:  Left:  Localized superficial thrombus noted in the [facility].  No evidence of DVT.    Rodneisha Bonnet, RVT 09/18/2014, 4:05 PM

## 2014-09-18 NOTE — Progress Notes (Signed)
Patient Demographics  [name], is a 80 y.o. female, DOB - May 06, 1935, TML:[id number]  Admit date - 09/16/2014   Admitting Physician Berle Mull, MD  Outpatient Primary MD for the patient is Tamsen Roers, MD  LOS - 2   Chief Complaint  Patient presents with  . Arm Pain      Admission history of present illness/brief narrative:  [name] is a 80 y.o. female with Past medical history of chronic kidney disease, coronary artery disease, GI bleeding, atrial fibrillation on warfarin, hypothyroidism, valvular heart disease, chronic diastolic heart failure. The patient is presenting with complaints of of left arm swelling. She has a right arm fistula placed in 2014, It was discovered that she had an elevated potassium more than 7.5 ,that was treated appropriately in The ER with minimal improvement, although she did have some rebound or her hyperkalemia t. EKG does demonstrated a paced rhythm and some peaking of the T waves. Patient was started on hemodialysis emergently by nephrology service.  Subjective:   [name] today has, No headache, No chest pain, No abdominal pain - No Nausea, No new weakness tingling or numbness, No Cough - SOB.  Assessment & Plan    Principal Problem:   Hyperkalemia Active Problems:   Anemia   Essential hypertension   CAD, NATIVE VESSEL   Gastric ulcer (2008)   PACEM[employer]ER-[employer]   CKD (chronic kidney disease) stage 4, GFR 15-29 ml/min   Supratherapeutic INR   Other specified hypothyroidism   Gout    Hyperkalemia -The patient is presenting with complaints of left arm swelling and pain. On further workup she was found to have significant hyperkalemia which was rechecked and verified. -With her significantly elevated potassium level with failure of conservative management with Kayexalate and sodium bicarbonate as  well as insulin and dextrose. - Patient had emergent dialysis today 09/17/14 -Potassium in a.m. is 5.3 - Recheck labs in a.m. unclear if patient will be dependent on dialysis or not yet.   Chronic kidney disease. Stage V - Patient started on hemodialysis by nephrology. One session on 2/3.   History of A. fib -Supratherapeutic INR, a flutter.- - Pharmacy will be managing warfarin dosing. -No active bleeding. - Patient recently was admitted for GI bleeding.   Chronic diastolic heart failure. Patient is on Lasix. At present I'll continue it. Holding metolazone.   Left arm swelling. -Patient presented with left arm swelling and redness. -Unlikely related to cellulitis especially with no fever or leukocytosis. Patient has history of gout and this appears of possible gout attack.  So started on colchicine - Likely related to DVT especially with supratherapeutic INR, will check venous Doppler.  Code Status: full  Family Communication: None at bedside  Disposition Plan: Advised her to telemetry   Procedures  Dialysis on 2/3   Consults   Nephrology   Medications  Scheduled Meds: . colchicine  0.6 mg Oral BID  . darbepoetin (ARANESP) injection - DIALYSIS  100 mcg Intravenous Q Wed-HD  . feeding supplement (ENSURE COMPLETE)  237 mL Oral BID BM  . feeding supplement (NEPRO CARB STEADY)  237 mL Oral BID BM  . furosemide  80 mg Oral BID  . hydrALAZINE  25 mg Oral TID  . isosorbide mononitrate  120 mg Oral Daily  . mometasone-formoterol  2 puff Inhalation BID  . pantoprazole  40 mg Oral BID  . sodium chloride  3 mL Intravenous Q12H  . Warfarin - Pharmacist Dosing Inpatient   Does not apply q1800   Continuous Infusions:   PRN Meds:.acetaminophen **OR** acetaminophen, ALPRAZolam, nitroGLYCERIN, ondansetron **OR** ondansetron (ZOFRAN) IV, oxyCODONE-acetaminophen  DVT Prophylaxis on warfarin  Lab Results  Component Value Date   PLT 184 09/18/2014    Antibiotics     Anti-infectives    None          Objective:   Filed Vitals:   09/18/14 0500 09/18/14 0735 09/18/14 1105 09/18/14 1150  BP:  132/55 149/47   Pulse:  60 60   Temp:  98.3 F (36.8 C) 98.4 F (36.9 C)   TempSrc:  Oral Oral   Resp:  17 20   Height:      Weight: 81.6 kg (179 lb 14.3 oz)     SpO2:  97% 97% 99%    Wt Readings from Last 3 Encounters:  09/18/14 81.6 kg (179 lb 14.3 oz)  09/16/14 81.103 kg (178 lb 12.8 oz)  08/13/14 82.8 kg (182 lb 8.7 oz)     Intake/Output Summary (Last 24 hours) at 09/18/14 1437 Last data filed at 09/18/14 1014  Gross per 24 hour  Intake 1201.5 ml  Output    365 ml  Net  836.5 ml     Physical Exam  Awake Alert, Oriented X 3, No new F.N deficits, Normal affect [place].AT,PERRAL Supple Neck,No JVD, No cervical lymphadenopathy appriciated.  Symmetrical Chest wall movement, Good air movement bilaterally, CTAB RRR,No Gallops,Rubs or new Murmurs, No Parasternal Heave +ve B.Sounds, Abd Soft, No tenderness, No organomegaly appriciated, No rebound - guarding or rigidity. No Cyanosis, Clubbing or edema in lower extremity, No new Rash or bruise  , mild edema in left upper extremity.   Data Review   Micro Results Recent Results (from the past 240 hour(s))  MRSA PCR Screening     Status: None   Collection Time: 09/17/14  4:14 AM  Result Value Ref Range Status   MRSA by PCR NEGATIVE NEGATIVE Final    Comment:        The GeneXpert MRSA Assay (FDA approved for NASAL specimens only), is one component of a comprehensive MRSA colonization surveillance program. It is not intended to diagnose MRSA infection nor to guide or monitor treatment for MRSA infections.     Radiology Reports Dg Hand Complete Left  09/16/2014   CLINICAL DATA:  Left hand pain and swelling, along the dorsum of the hand. Initial encounter.  EXAM: LEFT HAND - COMPLETE 3+ VIEW  COMPARISON:  None.  FINDINGS: There is no definite evidence of fracture or dislocation. Multiple  erosions are noted at the carpal rows, relatively focal along the mid and distal scaphoid. Degenerative change is noted at the first carpometacarpal joint, with chronic subluxation an underlying osteophyte formation. There is calcification of the triangular fibrocartilage.  Diffuse dorsal soft tissue swelling is noted, without evidence of associated fracture. Mild chronic deformity is noted at the base of the fifth metacarpal, likely reflecting remote injury.  IMPRESSION: 1. No definite evidence of fracture or dislocation. 2. Multiple erosions of the carpal rows, reflecting underlying erosive arthropathy. 3. Degenerative change at the first carpometacarpal joint likely reflects osteoarthritis. 4. Calcification of the triangular fibrocartilage. 5. Diffuse dorsal soft tissue swelling noted.   Electronically Signed   By: Francoise Schaumann.D.  On: 09/16/2014 21:26    CBC  Recent Labs Lab 09/16/14 1938 09/17/14 0523 09/17/14 0817 09/17/14 0917 09/18/14 0307  WBC 4.3 5.2 4.0  --  4.6  HGB 10.6* 8.8* 8.9* 10.2* 9.5*  HCT 32.2* 27.0* 27.6* 30.0* 29.4*  PLT 199 189 182  --  184  MCV 88.2 87.7 89.3  --  87.2  MCH 29.0 28.6 28.8  --  28.2  MCHC 32.9 32.6 32.2  --  32.3  RDW 16.2* 16.3* 16.2*  --  16.2*  LYMPHSABS 0.4* 0.4*  --   --   --   MONOABS 0.4 0.5  --   --   --   EOSABS 0.2 0.1  --   --   --   BASOSABS 0.0 0.0  --   --   --     Chemistries   Recent Labs Lab 09/16/14 2104 09/17/14 0058 09/17/14 0523 09/17/14 0817 09/17/14 0917 09/18/14 0307  NA 130* 131* 133* 131* 137 133*  K 7.4* 6.7* >7.5* 5.5* 4.2 5.3*  CL 104 105 108 103 101 101  CO2 19 19 17* 20  --  24  GLUCOSE 100* 89  88 112* 164* 90 101*  BUN 37* 36* 39* 35* 24* 24*  CREATININE 2.97* 3.14* 3.19* 3.16* 2.10* 2.50*  CALCIUM 8.6 9.0 8.5 8.7  --  8.1*   ------------------------------------------------------------------------------------------------------------------ estimated creatinine clearance is 20.7 mL/min (by C-G  formula based on Cr of 2.5). ------------------------------------------------------------------------------------------------------------------ No results for input(s): HGBA1C in the last 72 hours. ------------------------------------------------------------------------------------------------------------------ No results for input(s): CHOL, HDL, LDLCALC, TRIG, CHOLHDL, LDLDIRECT in the last 72 hours. ------------------------------------------------------------------------------------------------------------------ No results for input(s): TSH, T4TOTAL, T3FREE, THYROIDAB in the last 72 hours.  Invalid input(s): FREET3 ------------------------------------------------------------------------------------------------------------------  Recent Labs  09/17/14 0800  TIBC 180*  IRON 11*    Coagulation profile  Recent Labs Lab 09/16/14 1938 09/17/14 0523 09/18/14 0307  INR 5.32* 5.99* 4.66*    No results for input(s): DDIMER in the last 72 hours.  Cardiac Enzymes No results for input(s): CKMB, TROPONINI, MYOGLOBIN in the last 168 hours.  Invalid input(s): CK ------------------------------------------------------------------------------------------------------------------ Invalid input(s): POCBNP     Time Spent in minutes   25 minutes   Mattthew Ziomek M.D on 09/18/2014 at 2:37 PM  Between 7am to 7pm - Pager - [phone]  After 7pm go to [email or web address] - password [email or web address]  And look for the night coverage person covering for me after hours  [facility] Office  [phone]   **Disclaimer: This note may have been dictated with voice recognition software. Similar sounding words can inadvertently be transcribed and this note may contain transcription errors which may not have been corrected upon publication of note.**

## 2014-09-19 LAB — CBC
HEMATOCRIT: 27.7 % — AB (ref 36.0–46.0)
Hemoglobin: 9 g/dL — ABNORMAL LOW (ref 12.0–15.0)
MCH: 28.4 pg (ref 26.0–34.0)
MCHC: 32.5 g/dL (ref 30.0–36.0)
MCV: 87.4 fL (ref 78.0–100.0)
Platelets: 188 10*3/uL (ref 150–400)
RBC: 3.17 MIL/uL — AB (ref 3.87–5.11)
RDW: 15.9 % — ABNORMAL HIGH (ref 11.5–15.5)
WBC: 4.3 10*3/uL (ref 4.0–10.5)

## 2014-09-19 LAB — RENAL FUNCTION PANEL
Albumin: 2.1 g/dL — ABNORMAL LOW (ref 3.5–5.2)
Anion gap: 6 (ref 5–15)
BUN: 30 mg/dL — ABNORMAL HIGH (ref 6–23)
CALCIUM: 8.3 mg/dL — AB (ref 8.4–10.5)
CHLORIDE: 101 mmol/L (ref 96–112)
CO2: 27 mmol/L (ref 19–32)
CREATININE: 2.83 mg/dL — AB (ref 0.50–1.10)
GFR calc Af Amer: 17 mL/min — ABNORMAL LOW (ref >90)
GFR, EST NON AFRICAN AMERICAN: 15 mL/min — AB (ref >90)
GLUCOSE: 89 mg/dL (ref 70–99)
Phosphorus: 3.6 mg/dL (ref 2.3–4.6)
Potassium: 4.1 mmol/L (ref 3.5–5.1)
Sodium: 134 mmol/L — ABNORMAL LOW (ref 135–145)

## 2014-09-19 LAB — PROTIME-INR
INR: 3.53 — AB (ref 0.00–1.49)
PROTHROMBIN TIME: 35.7 s — AB (ref 11.6–15.2)

## 2014-09-19 MED ORDER — WARFARIN SODIUM 1 MG PO TABS: 1.0000 mg | ORAL_TABLET | Freq: Every day | ORAL | Status: AC

## 2014-09-19 MED ORDER — NEPRO/CARBSTEADY PO LIQD: 237.0000 mL | Freq: Two times a day (BID) | ORAL | Status: AC

## 2014-09-19 NOTE — Evaluation (Signed)
Physical Therapy Evaluation Patient Details Name: NIJA KOOPMAN MRN: [id number] DOB: 01/22/1935 Today's Date: 09/19/2014   History of Present Illness    Rosina SALIMATOU SIMONE is a 80 y.o. female with Past medical history of chronic kidney disease, coronary artery disease, GI bleeding, atrial fibrillation on warfarin, hypothyroidism, valvular heart disease, chronic diastolic heart failure. The patient is presenting with complaints of of left arm swelling. She has a right arm fistula placed in 2014, It was discovered that she had an elevated potassium more than 7.5 ,that was treated appropriately in The ER with minimal improvement, although she did have some rebound or her hyperkalemia t. EKG does demonstrated a paced rhythm and some peaking of the T waves. Patient was started on hemodialysis emergently by nephrology service.   Clinical Impression  Pt admitted with above diagnosis. Pt currently with functional limitations due to the deficits listed below (see PT Problem List). Pt reports she has 24 hour care at home.  Incidentally, son Mortimer Fries called whilePT in room and concurred that she has 24 hour care.  Informed him that family could get gait belt to help them guard her at home.  Also recommended continued HHPT at home and son agrees.  Pt will benefit from skilled PT to increase their independence and safety with mobility to allow discharge to the venue listed below.      Follow Up Recommendations Home health PT;Supervision/Assistance - 24 hour    Equipment Recommendations  Other (comment) (gait belt)    Recommendations for Other Services       Precautions / Restrictions Precautions Precautions: Fall Restrictions Weight Bearing Restrictions: No      Mobility  Bed Mobility Overal bed mobility: Needs Assistance Bed Mobility: Supine to Sit     Supine to sit: Min guard     General bed mobility comments: cues for technique  Transfers Overall transfer level: Needs  assistance Equipment used: Rolling walker (2 wheeled) Transfers: Sit to/from [facility] Sit to Stand: Min assist;Mod assist Stand pivot transfers: Min assist       General transfer comment: Pt needed cues for hand placement.  Also needed assist for power up.  Unsteady on feet once up with wide BOS.  Pt with somewhat flexed posture as well.   Assisted pt to 3N1 with pt needing to have a BM and this PT assisting her to 3N1 and then cleaning her when she was finished.  Needed total assist to clean pt. with pt able to hold onto RW and be cleaned needing min assist for stablity in static stance.    Ambulation/Gait Ambulation/Gait assistance: Min assist;Mod assist Ambulation Distance (Feet): 25 Feet Assistive device: Rolling walker (2 wheeled) Gait Pattern/deviations: Decreased stride length;Decreased step length - right;Decreased step length - left;Step-to pattern;Trunk flexed;Antalgic;Wide base of support   Gait velocity interpretation: Below normal speed for age/gender General Gait Details: Pt with flexed posture in standing and with ambulation.  Needs assist to move RW as well as to stay close to RW.  Pt with unsteady gait requiring min assist and cues.    Stairs            Wheelchair Mobility    Modified Rankin (Stroke Patients Only)       Balance Overall balance assessment: Needs assistance;History of Falls         Standing balance support: Bilateral upper extremity supported;During functional activity Standing balance-Leahy Scale: Poor Standing balance comment: Requires UE support.  Able to stand statically with RW with min  assist for support.                               Pertinent Vitals/Pain Pain Assessment: No/denies pain  VSS    Home Living Family/patient expects to be discharged to:: Private residence Living Arrangements: Children Available Help at Discharge: Family;Available 24 hours/day Type of Home: Apartment Home Access:  Stairs to enter Entrance Stairs-Rails: Right;Left Entrance Stairs-Number of Steps: 1 Home Layout: One level Home Equipment: Walker - 2 wheels;Bedside commode;Electric scooter;Grab bars - tub/shower;Shower seat;Cane - single point Additional Comments: Pt states her sons help her get up and down as needed    Prior Function Level of Independence: Needs assistance   Gait / Transfers Assistance Needed: sons help you get up and then the walk with the pt with the RW in house  ADL's / Homemaking Assistance Needed: pt reports someone helps her with bathing and dressing.  Sons do cooking and cleaning.   Comments: pt reports that someone walks with her everywhere     Hand Dominance   Dominant Hand: Right    Extremity/Trunk Assessment   Upper Extremity Assessment: Defer to OT evaluation           Lower Extremity Assessment: Generalized weakness      Cervical / Trunk Assessment: Kyphotic  Communication   Communication: HOH  Cognition Arousal/Alertness: Awake/alert Behavior During Therapy: WFL for tasks assessed/performed Overall Cognitive Status: Within Functional Limits for tasks assessed                      General Comments      Exercises        Assessment/Plan    PT Assessment Patient needs continued PT services  PT Diagnosis Generalized weakness   PT Problem List Decreased balance;Decreased activity tolerance;Decreased mobility;Decreased knowledge of use of DME;Decreased safety awareness;Decreased knowledge of precautions  PT Treatment Interventions DME instruction;Gait training;Functional mobility training;Therapeutic activities;Therapeutic exercise;Balance training;Patient/family education   PT Goals (Current goals can be found in the Care Plan section) Acute Rehab PT Goals Patient Stated Goal: to go home PT Goal Formulation: With patient Time For Goal Achievement: 09/26/14 Potential to Achieve Goals: Good    Frequency Min 3X/week   Barriers to  discharge        Co-evaluation               End of Session Equipment Utilized During Treatment: Gait belt Activity Tolerance: Patient limited by fatigue Patient left: in chair;with call bell/phone within reach Nurse Communication: Mobility status         Time: 0925-1004 PT Time Calculation (min) (ACUTE ONLY): 39 min   Charges:   PT Evaluation $Initial PT Evaluation Tier I: 1 Procedure PT Treatments $Gait Training: 8-22 mins $Self Care/Home Management: 8-22   PT G CodesIrwin Brakeman F 09-29-2014, 11:03 AM Anetta Olvera,PT Acute Rehabilitation [phone] (pager)

## 2014-09-19 NOTE — Care Management Note (Signed)
    Page 1 of 1   09/19/2014     11:29:19 AM CARE MANAGEMENT NOTE 09/19/2014  Patient:  [name], [name]   Account Number:  [id number]  Date Initiated:  09/19/2014  Documentation initiated by:  GRAVES-BIGELOW,Natahsa Marian  Subjective/Objective Assessment:   Pt admitted for complaints of of left arm swelling. Pt is from home with son.     Action/Plan:   Plan to return home today with resumption of [place] services with [facility]. Referral given and SOC to begin within 24-48 hrs post d/c.   Anticipated DC Date:  09/19/2014   Anticipated DC Plan:  [name]  CM consult      [facility] Choice  HOME HEALTH   Choice offered to / List presented to:  C-4 Adult Children        HH arranged  HH-1 RN  HH-10 DISEASE MANAGEMENT  HH-2 PT      [place].   Status of service:  Completed, signed off Medicare Important Message given?  YES (If response is "NO", the following Medicare IM given date fields will be blank) Date Medicare IM given:  09/19/2014 Medicare IM given by:  GRAVES-BIGELOW,Athena Baltz Date Additional Medicare IM given:   Additional Medicare IM given by:    Discharge Disposition:  [name]  Per UR Regulation:  Reviewed for med. necessity/level of care/duration of stay  If discussed at [place] Level of Stay Meetings, dates discussed:    Comments:

## 2014-09-19 NOTE — Discharge Instructions (Signed)
Follow with Primary MD Tamsen Roers, MD in 7 days   Get CBC, CMP, 2 view Chest X ray checked  by Primary MD next visit.    Activity: As tolerated with Full fall precautions use walker/cane & assistance as needed   Disposition Home with HH   Diet: Renal nondialysis diet with fluid restriction 1200 mL, with feeding assistance and aspiration precautions as needed.  For Heart failure patients - Check your Weight same time everyday, if you gain over 2 pounds, or you develop in leg swelling, experience more shortness of breath or chest pain, call your Primary MD immediately. Follow Cardiac Low Salt Diet and 1.8 lit/day fluid restriction.   On your next visit with your primary care physician please Get Medicines reviewed and adjusted.   Please request your Prim.MD to go over all Hospital Tests and Procedure/Radiological results at the follow up, please get all Hospital records sent to your Prim MD by signing hospital release before you go home.   If you experience worsening of your admission symptoms, develop shortness of breath, life threatening emergency, suicidal or homicidal thoughts you must seek medical attention immediately by calling 911 or calling your MD immediately  if symptoms less severe.  You Must read complete instructions/literature along with all the possible adverse reactions/side effects for all the Medicines you take and that have been prescribed to you. Take any new Medicines after you have completely understood and accpet all the possible adverse reactions/side effects.   Do not drive, operating heavy machinery, perform activities at heights, swimming or participation in water activities or provide baby sitting services if your were admitted for syncope or siezures until you have seen by Primary MD or a Neurologist and advised to do so again.  Do not drive when taking Pain medications.    Do not take more than prescribed Pain, Sleep and Anxiety Medications  Special  Instructions: If you have smoked or chewed Tobacco  in the last 2 yrs please stop smoking, stop any regular Alcohol  and or any Recreational drug use.  Wear Seat belts while driving.   Please note  You were cared for by a hospitalist during your hospital stay. If you have any questions about your discharge medications or the care you received while you were in the hospital after you are discharged, you can call the unit and asked to speak with the hospitalist on call if the hospitalist that took care of you is not available. Once you are discharged, your primary care physician will handle any further medical issues. Please note that NO REFILLS for any discharge medications will be authorized once you are discharged, as it is imperative that you return to your primary care physician (or establish a relationship with a primary care physician if you do not have one) for your aftercare needs so that they can reassess your need for medications and monitor your lab values.

## 2014-09-19 NOTE — Progress Notes (Signed)
S: Feels well.  Wants to go home O:BP 169/46 mmHg  Pulse 60  Temp(Src) 97.5 F (36.4 C) (Oral)  Resp 18  Ht 5\' 7"  (1.702 m)  Wt 81.33 kg (179 lb 4.8 oz)  BMI 28.08 kg/m2  SpO2 97%  Intake/Output Summary (Last 24 hours) at 09/19/14 1206 Last data filed at 09/19/14 0848  Gross per 24 hour  Intake    960 ml  Output   1225 ml  Net   -265 ml   Weight change: -0.97 kg (-2 lb 2.2 oz) WEX:HBZJI and alert RCV:ELFYB, irreg Resp: clear Abd:+ BS NDNT Ext: no edema  RUA AVF + bruit.  + edema Lt arm NEURO:CNI Ox3 no asterixis   . darbepoetin (ARANESP) injection - DIALYSIS  100 mcg Intravenous Q Wed-HD  . feeding supplement (NEPRO CARB STEADY)  237 mL Oral BID BM  . furosemide  80 mg Oral BID  . hydrALAZINE  25 mg Oral TID  . isosorbide mononitrate  120 mg Oral Daily  . mometasone-formoterol  2 puff Inhalation BID  . pantoprazole  40 mg Oral BID  . sodium chloride  3 mL Intravenous Q12H  . Warfarin - Pharmacist Dosing Inpatient   Does not apply q1800   No results found. BMET    Component Value Date/Time   NA 134* 09/19/2014 0419   K 4.1 09/19/2014 0419   CL 101 09/19/2014 0419   CO2 27 09/19/2014 0419   GLUCOSE 89 09/19/2014 0419   BUN 30* 09/19/2014 0419   CREATININE 2.83* 09/19/2014 0419   CALCIUM 8.3* 09/19/2014 0419   CALCIUM 8.7 03/02/2007 0505   GFRNONAA 15* 09/19/2014 0419   GFRAA 17* 09/19/2014 0419   CBC    Component Value Date/Time   WBC 4.3 09/19/2014 0419   RBC 3.17* 09/19/2014 0419   HGB 9.0* 09/19/2014 0419   HCT 27.7* 09/19/2014 0419   PLT 188 09/19/2014 0419   MCV 87.4 09/19/2014 0419   MCH 28.4 09/19/2014 0419   MCHC 32.5 09/19/2014 0419   RDW 15.9* 09/19/2014 0419   LYMPHSABS 0.4* 09/17/2014 0523   MONOABS 0.5 09/17/2014 0523   EOSABS 0.1 09/17/2014 0523   BASOSABS 0.0 09/17/2014 0523     Assessment: 1. Hyperkalemia, improved 2. CKD 4  (baseline Scr upper 2's, follows Dr Marval Regal) 3.anemia and Iron def   Plan: 1. Note plans for DC.    Will have our office call her to reschedule her FU visit that she missed while here.  2.  Leave off K supp for now Ceazia Harb T

## 2014-09-19 NOTE — Progress Notes (Signed)
ANTICOAGULATION CONSULT NOTE - Follow Up Consult  Pharmacy Consult for coumadin Indication: atrial fibrillation  Allergies  Allergen Reactions  . Amiodarone Hives  . Propylthiouracil Rash    Patient Measurements: Height: 5\' 7"  (170.2 cm) Weight: 179 lb 4.8 oz (81.33 kg) IBW/kg (Calculated) : 61.6  Vital Signs: Temp: 97.5 F (36.4 C) (02/05 0400) Temp Source: Oral (02/05 0400) BP: 169/46 mmHg (02/05 1016) Pulse Rate: 60 (02/05 0400)  Labs:  Recent Labs  09/17/14 0523 09/17/14 0817 09/17/14 0917 09/18/14 0307 09/19/14 0419  HGB 8.8* 8.9* 10.2* 9.5* 9.0*  HCT 27.0* 27.6* 30.0* 29.4* 27.7*  PLT 189 182  --  184 188  LABPROT 53.9*  --   --  44.3* 35.7*  INR 5.99*  --   --  4.66* 3.53*  CREATININE 3.19* 3.16* 2.10* 2.50* 2.83*    Estimated Creatinine Clearance: 18.3 mL/min (by C-G formula based on Cr of 2.83).   Medications:  Scheduled:  . darbepoetin (ARANESP) injection - DIALYSIS  100 mcg Intravenous Q Wed-HD  . feeding supplement (NEPRO CARB STEADY)  237 mL Oral BID BM  . furosemide  80 mg Oral BID  . hydrALAZINE  25 mg Oral TID  . isosorbide mononitrate  120 mg Oral Daily  . mometasone-formoterol  2 puff Inhalation BID  . pantoprazole  40 mg Oral BID  . sodium chloride  3 mL Intravenous Q12H  . Warfarin - Pharmacist Dosing Inpatient   Does not apply q1800    Assessment: 80yo female c/o LUE pain and swelling x4d, found to be hyperkalemic in ED, to continue Coumadin for Afib during admission. INR today is 3.53 with trend down, venous duplex noted with left superficial thrombus in the [facility] (no evidence of DVT).   Home coumadin dose: 1mg  po daily  Goal of Therapy:  INR 2-3 Monitor platelets by anticoagulation protocol: Yes   Plan:  -Hold coumadin today due to elevated INR -Daily PT/INR  Hildred Laser, Pharm D 09/19/2014 10:25 AM

## 2014-09-19 NOTE — Discharge Summary (Signed)
[name], 80 y.o., DOB [birth year], MRN [id number]. Admission date: 09/16/2014 Discharge Date 09/19/2014 Primary MD Tamsen Roers, MD Admitting Physician Berle Mull, MD   PCP please follow: - Please recheck patient BMP during next visit and follow-up potassium level ( her potassium supplement has been held, and her hyperkalemia on admission). - Please recheck INR level during next visit, patient had supratherapeutic INR on admission 5.3, INR is 3.5 at day of discharge on 2/5, will hold warfarin for next 2 days ,patient was instructed to resume warfarin 1 mg oral daily on Sunday 2/7.  Admission Diagnosis  Hyperkalemia [E87.5] Left arm swelling [M79.89] CKD (chronic kidney disease), unspecified stage [N18.9]  Discharge Diagnosis   Principal Problem:   Hyperkalemia Active Problems:   Anemia   Essential hypertension   CAD, NATIVE VESSEL   Gastric ulcer (2008)   PACEMAKER-Medtronic   CKD (chronic kidney disease) stage 4, GFR 15-29 ml/min   Supratherapeutic INR   Other specified hypothyroidism   Gout     Past Medical History  Diagnosis Date  . Cellulitis and abscess of unspecified site     a. h/o MSRA cellulitis of abdominal wall.  Marland Kitchen CAD (coronary artery disease)     a. DES to OM 01/2005. b. Rotational atherotomy/DES to prox LAD 02/2008. c. Cath 07/2013: stable, for med rx.  . Edema   . Warfarin anticoagulation     a. Followed by PCP.  Marland Kitchen GI bleed     a. EGD 01/2007: antral ulcers related to NSAID/aspirin use.  . Peripheral vascular disease   . Hyperthyroidism     a. Hx of intolerance to PTU.  Marland Kitchen Blood transfusion years ago    "heart problems" (09/26/2013)  . H/O hiatal hernia   . Umbilical hernia     unrepaired (11/24/11)  . Valvular heart disease     Echo 11/2011: mild-mod MR.  Marland Kitchen Hypertension     takes Amlodipine,Hydralazine,and Imdur daily  . Hyperlipidemia     takes Lovastatin daily  . SSS (sick sinus syndrome)     a. H/o AV node ablation 2008 secondary to difficult to  control afib/flutter, with subsequent pacer.  . Atrial fibrillation with rapid ventricular response     a. H/o amiodarone thyroiditis. b. s/p AV node ablation 2008 with implantation of PPM 01/2007, upgraded to CRT-P 06/2007.;takes Coumadin daily  . GERD (gastroesophageal reflux disease)     takes Protonix daily  . Chronic diastolic CHF (congestive heart failure)     takes Lasix daily and Spironolactone   . Pacemaker     Medtronic  . History of gout   . Hemorrhoids   . Depression with anxiety     takes Xanax daily  . Insomnia     takes Xanax if needed  . Heart murmur   . CKD (chronic kidney disease)     Cr ~2.3.  . Kidney stones   . Myocardial infarction     "I've had 2-3; last one was in /1996" (09/26/2013)  . Migraines     "last one was a pretty good while ago" (09/26/2013)  . Arthritis     "all over" (09/26/2013)  . Chronic lower back pain     deteriorating disc  . Iron deficiency anemia     takes an Iron Pill daily  . Pneumonia     Past Surgical History  Procedure Laterality Date  . Esophagogastroduodenoscopy    . Tonsillectomy  ~ 1960  . Laminectomy and microdiscectomy lumbar spine  08/2003  . Cardioversion  10/2004; 02/2007    /E-chart  . Cholecystectomy    . Appendectomy  1954  . Vaginal hysterectomy  ?1981  . Av node ablation  02/2007    /E-chart  . Insert / replace / remove pacemaker  01/2007    initial placement  . Insert / replace / remove pacemaker  06/2007    upgraded/E-chart  . Av fistula placement, brachiocephalic Right 02/961    "didn't work; my veins were too small"  . Joint replacement    . Total knee arthroplasty Left 01/2009  . Bladder and bowel  1970's    "tacked; damaged my intestines & had to remove some; done @ [facility]  . Cataract extraction w/ intraocular lens  implant, bilateral Bilateral   . Bascilic vein transposition Right 06/21/2013    Procedure: [place];  Surgeon: Serafina Mitchell, MD;  Location: [facility] OR;   Service: Vascular;  Laterality: Right;  . Esophagogastroduodenoscopy N/A 07/19/2013    Procedure: ESOPHAGOGASTRODUODENOSCOPY (EGD);  Surgeon: Lafayette Dragon, MD;  Location: [facility] ENDOSCOPY;  Service: Endoscopy;  Laterality: N/A;  . Balloon dilation N/A 07/19/2013    Procedure: BALLOON DILATION;  Surgeon: Lafayette Dragon, MD;  Location: [facility] ENDOSCOPY;  Service: Endoscopy;  Laterality: N/A;  . Esophagogastroduodenoscopy N/A 07/20/2013    Procedure: ESOPHAGOGASTRODUODENOSCOPY (EGD);  Surgeon: Lafayette Dragon, MD;  Location[facility] ENDOSCOPY;  Service: Endoscopy;  Laterality: N/A;  with removal of polyp in pylorus  . Bascilic vein transposition Right 09/20/2013    Procedure: 2ND STAGE RIGHT BASCILIC VEIN TRANSPOSITION; ULTRSOUND GUIDED;  Surgeon: Serafina Mitchell, MD;  Location: [facility] OR;  Service: Vascular;  Laterality: Right;  . Cardiac catheterization  2006; 2008; 2009; 01/13/2011  . Coronary angioplasty with stent placement  ~ 2007; ?    "had a total of 2 stents put in" 92/07/2014)  . Back surgery    . Left heart catheterization with coronary angiogram N/A 07/15/2013    Procedure: LEFT HEART CATHETERIZATION WITH CORONARY ANGIOGRAM;  Surgeon: Blane Ohara, MD;  Location: [facility] CATH LAB;  Service: Cardiovascular;  Laterality: N/A;  . Esophagogastroduodenoscopy N/A 07/25/2014    Procedure: ESOPHAGOGASTRODUODENOSCOPY (EGD);  Surgeon: Irene Shipper, MD;  Location: [facility] ENDOSCOPY;  Service: Endoscopy;  Laterality: N/A;  . Esophagogastroduodenoscopy N/A 08/09/2014    Procedure: ESOPHAGOGASTRODUODENOSCOPY (EGD);  Surgeon: Inda Castle, MD;  Location: [facility] ENDOSCOPY;  Service: Endoscopy;  Laterality: N/A;  Possible enteroscopy  . Givens capsule study N/A 08/11/2014    Procedure: GIVENS CAPSULE STUDY;  Surgeon: Inda Castle, MD;  Location: [facility] ENDOSCOPY;  Service: Endoscopy;  Laterality: N/A;  . Colonoscopy  last 2011     Hospital Course See H&P, Labs, Consult and Test reports for all details in brief, patient was admitted  for **  Principal Problem:   Hyperkalemia Active Problems:   Anemia   Essential hypertension   CAD, NATIVE VESSEL   Gastric ulcer (2008)   PACEMAKER-[employer]onic   CKD (chronic kidney disease) stage 4, GFR 15-29 ml/min   Supratherapeutic INR   Other specified hypothyroidism   Gout  [name] is a 80 y.o. female with Past medical history of chronic kidney disease, coronary artery disease, GI bleeding, atrial fibrillation on warfarin, hypothyroidism, valvular heart disease, chronic diastolic heart failure. The patient is presenting with complaints of of left arm swelling. She has a right arm fistula placed in 2014, It was discovered that she had an elevated potassium more than 7.5 ,that was treated appropriately in [facility] with Kayexalate, D50 with IV insulin, and calcium bicarbonate, with minimal improvement, the patient required emergent hemodialysis on 2/3 , initially with peak T-wave, no recurrence of hyperkalemia during hospital stay, of note patient was on potassium supplement at home as she is on large dose diuretics which has been hold and discharge, as well patient has not been compliant with renal diet, eating banana and orange juice.  Hyperkalemia -The patient is presenting with complaints of left arm swelling and pain. On further workup she was found to have significant hyperkalemia which was rechecked and verified. -With her significantly elevated potassium level with failure of conservative management with Kayexalate and sodium bicarbonate as well as insulin and dextrose. - Patient had emergent dialysis today 09/17/14 -Potassium level has been stable since, discussed with nephrology, does not seem to be needing any further hemodialysis. - We'll discontinue patient potassium supplement at home ( 40 meq every twice a day), and was instructed to follow renal diet. - Will need to check BMP with PCP during next visit, discussed the plan with the son.  Chronic kidney disease.  Stage V - Patient started on hemodialysis by nephrology. One session on 2/3. - No need for further dialysis, and to follow as an outpatient, resume on diuretics.   History of A. fib -Supratherapeutic INR on admission. - Patient recently was admitted for GI bleeding. - INR is 3.5 a day of discharge, patient was instructed to hold warfarin for the next 2 days, resume back on Sunday, and follow with her PCP next week to check INR level, cost the plan with the son.   Chronic diastolic heart failure. CL Lasix and metolazone  Left arm swelling. -Patient presented with left arm swelling and redness, venous Doppler showing superficial localized thrombus noted in the [facility], no evidence of DVT. - Swelling much improved with warm compress, and arm elevation.  Malnutrition - Started patient on Nepro  Weakness and debilitation - PT evaluation appreciated, discussed with son, report he is present 24 hours with her, will resume PT at home, and visiting nurse ,and they would like to defer SNF admission for now.    Consults  Nephrology Significant Tests:  See full reports for all details    Dg Hand Complete Left  09/16/2014   CLINICAL DATA:  Left hand pain and swelling, along the dorsum of the hand. Initial encounter.  EXAM: LEFT HAND - COMPLETE 3+ VIEW  COMPARISON:  None.  FINDINGS: There is no definite evidence of fracture or dislocation. Multiple erosions are noted at the carpal rows, relatively focal along the mid and distal scaphoid. Degenerative change is noted at the first carpometacarpal joint, with chronic subluxation an underlying osteophyte formation. There is calcification of the triangular fibrocartilage.  Diffuse dorsal soft tissue swelling is noted, without evidence of associated fracture. Mild chronic deformity is noted at the base of the fifth metacarpal, likely reflecting remote injury.  IMPRESSION: 1. No definite evidence of fracture or dislocation. 2. Multiple erosions of the carpal  rows, reflecting underlying erosive arthropathy. 3. Degenerative change at the first carpometacarpal joint likely reflects osteoarthritis. 4. Calcification of the triangular fibrocartilage. 5. Diffuse dorsal soft tissue swelling noted.   Electronically Signed   By: Garald Balding M.D.   On: 09/16/2014 21:26     Today   Subjective:   [name] today has no headache,no chest abdominal pain,no new weakness tingling or numbness, feels much better wants to go home today.   Objective:   Blood pressure 169/46,  pulse 60, temperature 97.5 F (36.4 C), temperature source Oral, resp. rate 18, height 5\' 7"  (1.702 m), weight 81.33 kg (179 lb 4.8 oz), SpO2 97 %.  Intake/Output Summary (Last 24 hours) at 09/19/14 1136 Last data filed at 09/19/14 0848  Gross per 24 hour  Intake    960 ml  Output   1225 ml  Net   -265 ml    Exam Awake Alert, Oriented *3, No new F.N deficits, Normal affect [place].AT,PERRAL Supple Neck,No JVD, No cervical lymphadenopathy appriciated.  Symmetrical Chest wall movement, Good air movement bilaterally, CTAB No Gallops,Rubs or new Murmurs, No Parasternal Heave +ve B.Sounds, Abd Soft, Non tender, No organomegaly appriciated, No rebound -guarding or rigidity. No Cyanosis, Clubbing or edema, No new Rash or bruise, left arm swelling much improved  Data Review   Cultures -   CBC w Diff: Lab Results  Component Value Date   WBC 4.3 09/19/2014   HGB 9.0* 09/19/2014   HCT 27.7* 09/19/2014   PLT 188 09/19/2014   LYMPHOPCT 8* 09/17/2014   MONOPCT 9 09/17/2014   EOSPCT 1 09/17/2014   BASOPCT 1 09/17/2014   CMP: Lab Results  Component Value Date   NA 134* 09/19/2014   K 4.1 09/19/2014   CL 101 09/19/2014   CO2 27 09/19/2014   BUN 30* 09/19/2014   CREATININE 2.83* 09/19/2014   PROT 6.2 08/07/2014   ALBUMIN 2.1* 09/19/2014   BILITOT 2.0* 08/07/2014   ALKPHOS 82 08/07/2014   AST 53* 08/07/2014   ALT 24 08/07/2014  .  Micro Results Recent Results (from the  past 240 hour(s))  MRSA PCR Screening     Status: None   Collection Time: 09/17/14  4:14 AM  Result Value Ref Range Status   MRSA by PCR NEGATIVE NEGATIVE Final    Comment:        The GeneXpert MRSA Assay (FDA approved for NASAL specimens only), is one component of a comprehensive MRSA colonization surveillance program. It is not intended to diagnose MRSA infection nor to guide or monitor treatment for MRSA infections.      Discharge Instructions      Follow-up Information    Follow up with [facility].   Why:  Home Health Services: Registered Nurse and Physical Therapy   Contact information:   [address] [place] [address] [phone]       Follow up with Tamsen Roers, MD. Schedule an appointment as soon as possible for a visit in 5 days.   Specialty:  Family Medicine   Why:  post hospitalization follow up.   Contact information:   [place] [address] [phone]       Follow up with DUNHAM,CYNTHIA B, MD. Schedule an appointment as soon as possible for a visit in 2 weeks.   Specialty:  Nephrology   Contact information:   [place] [address] [phone]       Discharge Medications     Medication List    STOP taking these medications        potassium chloride SA 20 MEQ tablet  Commonly known as:  K-DUR,KLOR-CON      TAKE these medications        ALPRAZolam 0.5 MG tablet  Commonly known as:  XANAX  Take 1 tablet (0.5 mg total) by mouth at bedtime as needed for anxiety or sleep.     camphor-menthol lotion  Commonly known as:  SARNA  Apply topically as  needed for itching.     colchicine 0.6 MG tablet  Take 1 tablet (0.6 mg total) by mouth 2 (two) times daily as needed (Gout).     ergocalciferol 50000 UNITS capsule  Commonly known as:  VITAMIN D2  Take 1 capsule (50,000 Units total) by mouth every Saturday. Take on saturdays     feeding supplement (NEPRO CARB STEADY) Liqd  Take 237  mLs by mouth 2 (two) times daily between meals.     Fluticasone-Salmeterol 100-50 MCG/DOSE Aepb  Commonly known as:  ADVAIR DISKUS  Inhale 1 puff into the lungs 2 (two) times daily.     furosemide 80 MG tablet  Commonly known as:  LASIX  Take 1 tablet (80 mg total) by mouth 2 (two) times daily.     hydrALAZINE 25 MG tablet  Commonly known as:  APRESOLINE  Take 1 tablet (25 mg total) by mouth 3 (three) times daily.     isosorbide mononitrate 60 MG 24 hr tablet  Commonly known as:  IMDUR  Take 2 tablets (120 mg total) by mouth daily.     levothyroxine 100 MCG tablet  Commonly known as:  SYNTHROID, LEVOTHROID  Take 1 tablet (100 mcg total) by mouth daily before breakfast.     loratadine 10 MG tablet  Commonly known as:  CLARITIN  Take 1 tablet (10 mg total) by mouth daily.     lovastatin 10 MG tablet  Commonly known as:  MEVACOR  Take 1 tablet (10 mg total) by mouth at bedtime.     magnesium oxide 400 MG tablet  Commonly known as:  MAG-OX  Take 1 tablet (400 mg total) by mouth at bedtime.     metolazone 2.5 MG tablet  Commonly known as:  ZAROXOLYN  Take 1 tablet (2.5 mg total) by mouth 2 (two) times a week. Monday and Friday     nitroGLYCERIN 0.4 MG SL tablet  Commonly known as:  NITROSTAT  Place 1 tablet (0.4 mg total) under the tongue every 5 (five) minutes as needed for chest pain.     ondansetron 4 MG tablet  Commonly known as:  ZOFRAN  Take 1 tablet (4 mg total) by mouth every 8 (eight) hours as needed for nausea or vomiting.     oxyCODONE-acetaminophen 5-325 MG per tablet  Commonly known as:  PERCOCET/ROXICET  Take 1 tablet by mouth every 6 (six) hours as needed for severe pain.     pantoprazole 40 MG tablet  Commonly known as:  PROTONIX  Take 1 tablet (40 mg total) by mouth 2 (two) times daily.     promethazine 25 MG tablet  Commonly known as:  PHENERGAN  Take 1 tablet (25 mg total) by mouth every 8 (eight) hours as needed for nausea or vomiting.      warfarin 1 MG tablet  Commonly known as:  COUMADIN  Take 1 tablet (1 mg total) by mouth daily at 6 PM.  Start taking on:  09/21/2014         Total Time in preparing paper work, data evaluation and todays exam - 35 minutes  ELGERGAWY, DAWOOD M.D on 09/19/2014 at 11:36 AM  [place]  [phone]

## 2014-09-22 ENCOUNTER — Telehealth: Payer: Self-pay | Admitting: Cardiovascular Disease

## 2014-09-22 NOTE — Telephone Encounter (Signed)
We do not monitor or manage pt's INR.  In last OV note states pt's primary MD is managing.

## 2014-09-22 NOTE — Telephone Encounter (Signed)
New message       [facility] [place] [facility] pt[facility]inr.  There number is [phone]

## 2014-09-22 NOTE — Telephone Encounter (Signed)
Pt's son called back, advised pt is not having Coumadin managed in our [facility]in Clinic.  Pt's son states he didn't call then Coumadin clinic he called Dr Kyla Balzarine office, I advised pt's son we manage Dr Kyla Balzarine pt's Coumadin.  Documented in Epic anti-coag note 2013 pt switched to primary MD for Coumadin management.  Home Health order will need to be made through Dr Rex Kras pt's primary MD office.  Pt's son got upset and stated to forget about it and hung up on me.

## 2014-10-08 ENCOUNTER — Ambulatory Visit
Admission: RE | Admit: 2014-10-08 | Discharge: 2014-10-08 | Disposition: A | Discharge status: Home / Self Care | Payer: Medicare Other | Source: Ambulatory Visit | Attending: Nephrology | Admitting: Nephrology

## 2014-10-08 ENCOUNTER — Other Ambulatory Visit: Payer: Self-pay | Admitting: Nephrology

## 2014-10-08 DIAGNOSIS — R05 Cough: Secondary | ICD-10-CM

## 2014-10-08 DIAGNOSIS — R059 Cough, unspecified: Secondary | ICD-10-CM

## 2014-10-08 DIAGNOSIS — R0602 Shortness of breath: Secondary | ICD-10-CM

## 2014-10-20 ENCOUNTER — Encounter: Payer: Self-pay | Admitting: Physician Assistant

## 2014-10-20 ENCOUNTER — Ambulatory Visit (INDEPENDENT_AMBULATORY_CARE_PROVIDER_SITE_OTHER): Payer: Medicare Other | Admitting: Physician Assistant

## 2014-10-20 VITALS — BP 138/50 | HR 56 | Ht 67.0 in | Wt 165.0 lb

## 2014-10-20 DIAGNOSIS — R1314 Dysphagia, pharyngoesophageal phase: Secondary | ICD-10-CM

## 2014-10-20 DIAGNOSIS — Z7901 Long term (current) use of anticoagulants: Secondary | ICD-10-CM | POA: Diagnosis not present

## 2014-10-20 NOTE — Progress Notes (Signed)
Patient ID: TEYAH ROSSY, female   DOB: 16-Apr-1935, 80 y.o.   MRN: [id number]   Subjective:    Patient ID: Jacqualyn Posey, female    DOB: Dec 26, 1934, 79 y.o.   MRN: [id number]  HPI Yetunde is a pleasant 80 year old white female known to Dr. Ardis Hughs. She has multiple medical problems including coronary artery disease status post stents, congestive heart failure, pacemaker placement, chronic kidney disease, and atrial fibrillation for which she is on Coumadin.. Patient had a recent hospitalization due to her kidney disease and had hospitalization in December 2015 with GI bleeding. She had undergone an EGD at that time which showed a large caliber distal esophageal ring not dilated and otherwise negative exam she had enteroscopy which was negative and capsule  endoscopy which was negative. Colonoscopy was last done in 2011. She is now back on her Coumadin and has been doing well without any evidence of GI bleeding. She comes in today with complaints of dysphagia. She says she has had some long-term problems with swallowing but feels that her swallowing is significantly worse since December when she was hospitalized. She is having trouble with both liquids and solids and has trouble just about every meal. She says she can swallow water and it will cause coughing and strangling. If she eats solid food she feels as if the food lodges and she often regurgitates it back up and this also causes coughing and strangling. He has been drinking with a straw taking small sips and also chewing very carefully and all of these measures are helpful. He has no current complaints of odynophagia or heartburn. She did have an endoscopy in 2014 per Dr. Delfin Edis with findings of a somewhat tortuous esophagus or definite stricture she was felt to have presbyesophagus and was empirically dilated with a Savary to 19 mm. She cannot tell me whether this made much difference or not.  Review of Systems Pertinent positive and  negative review of systems were noted in the above HPI section.  All other review of systems was otherwise negative.  Outpatient Encounter Prescriptions as of 10/20/2014  Medication Sig  . ALPRAZolam (XANAX) 0.5 MG tablet Take 1 tablet (0.5 mg total) by mouth at bedtime as needed for anxiety or sleep.  . camphor-menthol (SARNA) lotion Apply topically as needed for itching. (Patient taking differently: Apply 1 application topically daily as needed for itching. )  . colchicine 0.6 MG tablet Take 1 tablet (0.6 mg total) by mouth 2 (two) times daily as needed (Gout).  Marland Kitchen ergocalciferol (VITAMIN D2) 50000 UNITS capsule Take 1 capsule (50,000 Units total) by mouth every Saturday. Take on saturdays  . Fluticasone-Salmeterol (ADVAIR DISKUS) 100-50 MCG/DOSE AEPB Inhale 1 puff into the lungs 2 (two) times daily.  . furosemide (LASIX) 80 MG tablet Take 1 tablet (80 mg total) by mouth 2 (two) times daily.  . hydrALAZINE (APRESOLINE) 25 MG tablet Take 1 tablet (25 mg total) by mouth 3 (three) times daily.  . isosorbide mononitrate (IMDUR) 60 MG 24 hr tablet Take 2 tablets (120 mg total) by mouth daily.  Marland Kitchen levothyroxine (SYNTHROID, LEVOTHROID) 100 MCG tablet Take 1 tablet (100 mcg total) by mouth daily before breakfast.  . loratadine (CLARITIN) 10 MG tablet Take 1 tablet (10 mg total) by mouth daily.  Marland Kitchen lovastatin (MEVACOR) 10 MG tablet Take 1 tablet (10 mg total) by mouth at bedtime.  . magnesium oxide (MAG-OX) 400 MG tablet Take 1 tablet (400 mg total) by mouth at bedtime.  . metolazone (  ZAROXOLYN) 2.5 MG tablet Take 1 tablet (2.5 mg total) by mouth 2 (two) times a week. Monday and Friday  . nitroGLYCERIN (NITROSTAT) 0.4 MG SL tablet Place 1 tablet (0.4 mg total) under the tongue every 5 (five) minutes as needed for chest pain.  . Nutritional Supplements (FEEDING SUPPLEMENT, NEPRO CARB STEADY,) LIQD Take 237 mLs by mouth 2 (two) times daily between meals.  . ondansetron (ZOFRAN) 4 MG tablet Take 1 tablet (4 mg  total) by mouth every 8 (eight) hours as needed for nausea or vomiting.  Marland Kitchen oxyCODONE-acetaminophen (PERCOCET/ROXICET) 5-325 MG per tablet Take 1 tablet by mouth every 6 (six) hours as needed for severe pain.  . pantoprazole (PROTONIX) 40 MG tablet Take 1 tablet (40 mg total) by mouth 2 (two) times daily.  . promethazine (PHENERGAN) 25 MG tablet Take 1 tablet (25 mg total) by mouth every 8 (eight) hours as needed for nausea or vomiting.  . warfarin (COUMADIN) 1 MG tablet Take 1 tablet (1 mg total) by mouth daily at 6 PM.   Allergies  Allergen Reactions  . Amiodarone Hives  . Propylthiouracil Rash   Patient Active Problem List   Diagnosis Date Noted  . Hyperkalemia 09/17/2014  . Gout 09/17/2014  . Cellulitis 09/16/2014  . Hematemesis with nausea   . Pain   . Gastrointestinal hemorrhage with melena   . Decubitus ulcer of left ischium, stage 3 08/04/2014  . Chronic kidney disease (CKD), stage IV (severe)   . Other specified hypothyroidism   . Pulmonary hypertension 07/23/2014  . Chronic right-sided heart failure 07/23/2014  . Severe tricuspid regurgitation 07/23/2014  . Esophageal dysmotility 07/23/2014  . Acute blood loss anemia 07/23/2014  . Increased ammonia level 07/23/2014  . Pancytopenia 07/23/2014  . GI bleed 07/22/2014  . Supratherapeutic INR 07/22/2014  . Long term current use of anticoagulant therapy 05/07/2014  . Lung nodule 11/12/2013  . Gastric polyp 07/21/2013  . Benign neoplasm of stomach 07/20/2013  . Dysphagia 07/18/2013  . CKD (chronic kidney disease) stage 4, GFR 15-29 ml/min 07/09/2013  . HLD (hyperlipidemia) 07/09/2013  . CAD, NATIVE VESSEL 05/05/2010  . Anemia 12/25/2008  . BRADYCARDIA 12/25/2008  . PACEMAKER-Medtronic 12/25/2008  . Hyperthyroidism 08/07/2007  . DEPRESSION 05/14/2007  . Essential hypertension 05/14/2007  . Campath-induced atrial fibrillation 05/14/2007  . Gastric ulcer (2008) 05/14/2007   History   Social History  . Marital Status:  Widowed    Spouse Name: N/A  . Number of Children: N/A  . Years of Education: N/A   Occupational History  . Not on file.   Social History Main Topics  . Smoking status: Former Smoker -- 1.00 packs/day for 50 years    Types: Cigarettes    Quit date: 07/13/1995  . Smokeless tobacco: Never Used     Comment: quit smoking around 1996  . Alcohol Use: No  . Drug Use: No  . Sexual Activity: No   Other Topics Concern  . Not on file   Social History Narrative   Lives in [place].    Ms. Zackery family history includes Hypertension in her brother; Kidney disease in her brother, brother, maternal aunt, maternal grandmother, and mother; Learning disabilities in her son; Other in her father and mother; Stroke in her brother. There is no history of Colon cancer or Thyroid disease.      Objective:    Filed Vitals:   10/20/14 1408  BP: 138/50  Pulse: 56    Physical Exam  well-developed elderly white female sitting in  a wheelchair accompanied by her son in no acute distress blood pressure 138/50 pulse 56 height 5 foot 7 weight 165. HEENT; nontraumatic normocephalic EOMI PERRLA sclera anicteric, Supple; no JVD, Cardiovascular; regular rate and rhythm with B0-Z5 soft systolic murmur, Pulmonary ;clear bilaterally, Abdomen; soft nontender nondistended bowel sounds are active no palpable mass or hepatosplenomegaly, Rectal; exam not done, Extremities; no clubbing cyanosis or edema skin warm and dry, Psych; mood and affect appropriate       Assessment & Plan:   #1 80 yo female with worsening dysphagia to liquids and solids . I suspect this is secondary to motility disorder/presbyesophagus and not a stricture. #2 CKD-stage 4 #3 atrial fib #4 CHF-Right sided heart failure with pulm HTN #5 chronic anticoagulation #6 CAD  #7 CHF #8 GI bleed 07/2014- no etiology identified  Plan; Pt is higher risk for complications with sedation and on Coumadin. Will schedule for Barium swallow with  tablet If she has a stricture then EGD Off Coumadin with Dr Ardis Hughs at hospital, if negative may benefit from speech path eval    Desire Fulp S Lacoya Wilbanks PA-C 10/20/2014   Cc: Tamsen Roers, MD

## 2014-10-20 NOTE — Patient Instructions (Addendum)
You have been scheduled for a Barium Esophogram at [facility] Radiology (1st floor of the hospital) on  Thursday 10-23-2014 at 11:30 am . Please arrive at 11:15 am  to your appointment for registration. Make certain not to have anything to eat or drink 3 hours prior to your test. If you need to reschedule for any reason, please contact radiology at [phone] to do so.  We will call you with the results of the test. __________________________________________________________________ A barium swallow is an examination that concentrates on views of the esophagus. This tends to be a double contrast exam (barium and two liquids which, when combined, create a gas to distend the wall of the oesophagus) or single contrast (non-ionic iodine based). The study is usually tailored to your symptoms so a good history is essential. Attention is paid during the study to the form, structure and configuration of the esophagus, looking for functional disorders (such as aspiration, dysphagia, achalasia, motility and reflux) EXAMINATION You may be asked to change into a gown, depending on the type of swallow being performed. A radiologist and radiographer will perform the procedure. The radiologist will advise you of the type of contrast selected for your procedure and direct you during the exam. You will be asked to stand, sit or lie in several different positions and to hold a small amount of fluid in your mouth before being asked to swallow while the imaging is performed .In some instances you may be asked to swallow barium coated marshmallows to assess the motility of a solid food bolus. The exam can be recorded as a digital or video fluoroscopy procedure. POST PROCEDURE It will take 1-2 days for the barium to pass through your system. To facilitate this, it is important, unless otherwise directed, to increase your fluids for the next 24-48hrs and to resume your normal diet.  This test typically takes about 30 minutes  to perform. __________________________________________________________________________________

## 2014-10-21 NOTE — Progress Notes (Signed)
i agree with BEesophagram, plan above

## 2014-10-23 ENCOUNTER — Ambulatory Visit (HOSPITAL_COMMUNITY)
Admission: RE | Admit: 2014-10-23 | Discharge: 2014-10-23 | Disposition: A | Discharge status: Home / Self Care | Payer: Medicare Other | Source: Ambulatory Visit | Attending: Physician Assistant | Admitting: Physician Assistant

## 2014-10-23 DIAGNOSIS — R1314 Dysphagia, pharyngoesophageal phase: Secondary | ICD-10-CM | POA: Insufficient documentation

## 2014-11-25 ENCOUNTER — Encounter: Payer: Self-pay | Admitting: Cardiovascular Disease

## 2014-11-25 ENCOUNTER — Encounter: Payer: Self-pay | Admitting: Internal Medicine

## 2014-11-25 ENCOUNTER — Ambulatory Visit (INDEPENDENT_AMBULATORY_CARE_PROVIDER_SITE_OTHER): Payer: Medicare Other | Admitting: Cardiovascular Disease

## 2014-11-25 ENCOUNTER — Ambulatory Visit (INDEPENDENT_AMBULATORY_CARE_PROVIDER_SITE_OTHER): Payer: Medicare Other | Admitting: Internal Medicine

## 2014-11-25 VITALS — BP 136/50 | HR 61 | Ht 67.0 in | Wt 173.0 lb

## 2014-11-25 DIAGNOSIS — I509 Heart failure, unspecified: Secondary | ICD-10-CM | POA: Diagnosis not present

## 2014-11-25 DIAGNOSIS — I482 Chronic atrial fibrillation, unspecified: Secondary | ICD-10-CM

## 2014-11-25 DIAGNOSIS — R29898 Other symptoms and signs involving the musculoskeletal system: Secondary | ICD-10-CM

## 2014-11-25 DIAGNOSIS — Z950 Presence of cardiac pacemaker: Secondary | ICD-10-CM | POA: Diagnosis not present

## 2014-11-25 DIAGNOSIS — K921 Melena: Secondary | ICD-10-CM

## 2014-11-25 DIAGNOSIS — I50812 Chronic right heart failure: Secondary | ICD-10-CM

## 2014-11-25 LAB — MDC_IDC_ENUM_SESS_TYPE_INCLINIC
Battery Remaining Longevity: 22 mo
Battery Voltage: 2.91 V
Implantable Pulse Generator Model: 8042
Lead Channel Impedance Value: 0 Ohm
Lead Channel Impedance Value: 475 Ohm
Lead Channel Pacing Threshold Amplitude: 1 V
Lead Channel Pacing Threshold Pulse Width: 0.4 ms
Lead Channel Sensing Intrinsic Amplitude: 8 mV
Lead Channel Setting Pacing Amplitude: 2 V
Lead Channel Setting Pacing Pulse Width: 0.4 ms
Lead Channel Setting Sensing Sensitivity: 2.8 mV
MDC IDC MSMT LEADCHNL LV IMPEDANCE VALUE: 677 Ohm
MDC IDC MSMT LEADCHNL LV PACING THRESHOLD AMPLITUDE: 1 V
MDC IDC MSMT LEADCHNL RV PACING THRESHOLD PULSEWIDTH: 0.4 ms
MDC IDC SESS DTM: 20160412114124
MDC IDC SET LEADCHNL LV PACING PULSEWIDTH: 0.4 ms
MDC IDC SET LEADCHNL RV PACING AMPLITUDE: 2.5 V
MDC IDC STAT BRADY RV PERCENT PACED: 95 %

## 2014-11-25 NOTE — Assessment & Plan Note (Signed)
Her ventricular rate is well controlled. No change in meds.

## 2014-11-25 NOTE — Assessment & Plan Note (Signed)
Her symptoms are well compensated and class II. She will continue her current medications.

## 2014-11-25 NOTE — Assessment & Plan Note (Signed)
Stable with no angina and good activity level.  Continue medical Rx  

## 2014-11-25 NOTE — Progress Notes (Signed)
[name] [name].   [name] is a Alexandria pleasant 80 yo [name] with a history of symptomatic bradycardia, status post pacemaker insertion, hypertension, chronic renal insufficiency with stage IV renal failure, and arthritis. [name] has atrial fibrillation and underwent AV node ablation and pacemaker insertion over 7 years ago.in [place] interim, [name] has been bothered by peripheral edema. [name] has been in [place] hospital with GI bleeding. [name] has a h/o stroke and has not stopped her warfarin. [name] has minimal shortness of breath. No chest pain. Allergies  Allergen Reactions  . Amiodarone Hives  . Propylthiouracil Rash     Current Outpatient Prescriptions  Medication Sig Dispense Refill  . ALPRAZolam (XANAX) 0.5 MG tablet Take 1 tablet (0.5 mg total) by mouth at bedtime as needed for anxiety or sleep. 30 tablet 0  . camphor-menthol (SARNA) lotion Apply topically as needed for itching. (Patient taking differently: Apply 1 application topically daily as needed for itching. ) 222 mL 0  . colchicine 0.6 MG tablet Take 1 tablet (0.6 mg total) by mouth 2 (two) times daily as needed (Gout). [place] tablet 0  . ergocalciferol (VITAMIN D2) 50000 UNITS capsule Take 1 capsule (50,000 Units total) by mouth every Saturday. Take on saturdays 10 capsule 0  . Fluticasone-Salmeterol (ADVAIR DISKUS) 100-50 MCG/DOSE AEPB Inhale 1 puff into [place] lungs 2 (two) times daily. 1 each 2  . furosemide (LASIX) 80 MG tablet Take 1 tablet (80 mg total) by mouth 2 (two) times daily. [place] tablet 1  . hydrALAZINE (APRESOLINE) 25 MG tablet Take 1 tablet (25 mg total) by mouth 3 (three) times daily. 90 tablet 1  . isosorbide mononitrate (IMDUR) [place] MG 24 hr tablet Take 2 tablets (120 mg total) by mouth daily. [place] tablet 3  . levothyroxine (SYNTHROID, LEVOTHROID) 100 MCG tablet Take 1 tablet (100 mcg total) by mouth daily before breakfast. 30 tablet 2  . loratadine (CLARITIN) 10 MG tablet Take 1 tablet (10 mg total) by mouth daily. 30  tablet 1  . lovastatin (MEVACOR) 10 MG tablet Take 1 tablet (10 mg total) by mouth at bedtime. 30 tablet 2  . magnesium oxide (MAG-OX) 400 MG tablet Take 1 tablet (400 mg total) by mouth at bedtime. 30 tablet 0  . metolazone (ZAROXOLYN) 2.5 MG tablet Take 1 tablet (2.5 mg total) by mouth 2 (two) times a week. Monday and Friday (Patient taking differently: Take 2.5 mg by mouth as needed. Monday and Friday) 30 tablet 1  . nitroGLYCERIN (NITROSTAT) 0.4 MG SL tablet Place 1 tablet (0.4 mg total) under [place] tongue every 5 (five) minutes as needed for chest pain. 30 tablet 12  . Nutritional Supplements (FEEDING SUPPLEMENT, NEPRO CARB STEADY,) LIQD Take 237 mLs by mouth 2 (two) times daily between meals. 24 Can 0  . ondansetron (ZOFRAN) 4 MG tablet Take 1 tablet (4 mg total) by mouth every 8 (eight) hours as needed for nausea or vomiting. 20 tablet 0  . oxyCODONE-acetaminophen (PERCOCET/ROXICET) 5-325 MG per tablet Take 1 tablet by mouth every 6 (six) hours as needed for severe pain. 30 tablet 0  . pantoprazole (PROTONIX) 40 MG tablet Take 1 tablet (40 mg total) by mouth 2 (two) times daily. [place] tablet 3  . promethazine (PHENERGAN) 25 MG tablet Take 1 tablet (25 mg total) by mouth every 8 (eight) hours as needed for nausea or vomiting. 30 tablet 1  . warfarin (COUMADIN) 1 MG tablet Take 1 tablet (1 mg total) by mouth daily at 6  PM. 30 tablet 2  . [DISCONTINUED] FLUoxetine (PROZAC) 20 MG capsule Take 20 mg by mouth daily.       No current facility-administered medications for this visit.     Past Medical History  Diagnosis Date  . Cellulitis and abscess of unspecified site     a. h/o MSRA cellulitis of abdominal wall.  Marland Kitchen CAD (coronary artery disease)     a. DES to OM 01/2005. b. Rotational atherotomy/DES to prox LAD 02/2008. c. Cath 07/2013: stable, for med rx.  . Edema   . Warfarin anticoagulation     a. Followed by PCP.  Marland Kitchen GI bleed     a. EGD 01/2007: antral ulcers related to NSAID/aspirin use.  .  Peripheral vascular disease   . Hyperthyroidism     a. Hx of intolerance to PTU.  Marland Kitchen Blood transfusion years ago    "heart problems" (09/26/2013)  . H/O hiatal hernia   . Umbilical hernia     unrepaired (11/24/11)  . Valvular heart disease     Echo 11/2011: mild-mod MR.  Marland Kitchen Hypertension     takes Amlodipine,Hydralazine,and Imdur daily  . Hyperlipidemia     takes Lovastatin daily  . SSS (sick sinus syndrome)     a. H/o AV node ablation 2008 secondary to difficult to control afib/flutter, with subsequent pacer.  . Atrial fibrillation with rapid ventricular response     a. H/o amiodarone thyroiditis. b. s/p AV node ablation 2008 with implantation of PPM 01/2007, upgraded to CRT-P 06/2007.;takes Coumadin daily  . GERD (gastroesophageal reflux disease)     takes Protonix daily  . Chronic diastolic CHF (congestive heart failure)     takes Lasix daily and Spironolactone   . Pacemaker     Medtronic  . History of gout   . Hemorrhoids   . Depression with anxiety     takes Xanax daily  . Insomnia     takes Xanax if needed  . Heart murmur   . CKD (chronic kidney disease)     Cr ~2.3.  . Kidney stones   . Myocardial infarction     "I've had 2-3; last one was in /1996" (09/26/2013)  . Migraines     "last one was a pretty good while ago" (09/26/2013)  . Arthritis     "all over" (09/26/2013)  . Chronic lower back pain     deteriorating disc  . Iron deficiency anemia     takes an Iron Pill daily  . Pneumonia     ROS:   All systems reviewed and negative except as noted in [name].   Past Surgical History  Procedure Laterality Date  . Esophagogastroduodenoscopy    . Tonsillectomy  ~ 1960  . Laminectomy and microdiscectomy lumbar spine  08/2003  . Cardioversion  10/2004; 02/2007    /E-chart  . Cholecystectomy    . Appendectomy  1954  . Vaginal hysterectomy  ?1981  . Av node ablation  02/2007    /E-chart  . Insert / replace / remove pacemaker  01/2007    initial placement  . Insert /  replace / remove pacemaker  06/2007    upgraded/E-chart  . Av fistula placement, brachiocephalic Right 04/5637    "didn't work; my veins were too small"  . Joint replacement    . Total knee arthroplasty Left 01/2009  . Bladder and bowel  1970's    "tacked; damaged my intestines & had to remove some; done @ [facility]  . Cataract extraction  w/ intraocular lens  implant, bilateral Bilateral   . Bascilic vein transposition Right 06/21/2013    Procedure: [place];  Surgeon: Serafina Mitchell, MD;  Location: [facility] OR;  Service: Vascular;  Laterality: Right;  . Esophagogastroduodenoscopy N/A 07/19/2013    Procedure: ESOPHAGOGASTRODUODENOSCOPY (EGD);  Surgeon: Lafayette Dragon, MD;  Location: [facility] ENDOSCOPY;  Service: Endoscopy;  Laterality: N/A;  . Balloon dilation N/A 07/19/2013    Procedure: BALLOON DILATION;  Surgeon: Lafayette Dragon, MD;  Location: [facility] ENDOSCOPY;  Service: Endoscopy;  Laterality: N/A;  . Esophagogastroduodenoscopy N/A 07/20/2013    Procedure: ESOPHAGOGASTRODUODENOSCOPY (EGD);  Surgeon: Lafayette Dragon, MD;  Location: [facility] ENDOSCOPY;  Service: Endoscopy;  Laterality: N/A;  with removal of polyp in pylorus  . Bascilic vein transposition Right 09/20/2013    Procedure: 2ND STAGE RIGHT BASCILIC VEIN TRANSPOSITION; ULTRSOUND GUIDED;  Surgeon: Serafina Mitchell, MD;  Location: [facility] OR;  Service: Vascular;  Laterality: Right;  . Cardiac catheterization  2006; 2008; 2009; 01/13/2011  . Coronary angioplasty with stent placement  ~ 2007; ?    "had a total of 2 stents put in" 92/07/2014)  . Back surgery    . Left heart catheterization with coronary angiogram N/A 07/15/2013    Procedure: LEFT HEART CATHETERIZATION WITH CORONARY ANGIOGRAM;  Surgeon: Blane Ohara, MD;  Location: [facility] CATH LAB;  Service: Cardiovascular;  Laterality: N/A;  . Esophagogastroduodenoscopy N/A 07/25/2014    Procedure: ESOPHAGOGASTRODUODENOSCOPY (EGD);  Surgeon: Irene Shipper, MD;  Location: [facility] ENDOSCOPY;   Service: Endoscopy;  Laterality: N/A;  . Esophagogastroduodenoscopy N/A 08/09/2014    Procedure: ESOPHAGOGASTRODUODENOSCOPY (EGD);  Surgeon: Inda Castle, MD;  Location: [facility] ENDOSCOPY;  Service: Endoscopy;  Laterality: N/A;  Possible enteroscopy  . [place]ens capsule study N/A 08/11/2014    Procedure: GIVENS CAPSULE STUDY;  Surgeon: Inda Castle, MD;  Location: [facility] ENDOSCOPY;  Service: Endoscopy;  Laterality: N/A;  . Colonoscopy  last 2011     Family History  Problem Relation Age of Onset  . Colon cancer Neg Hx   . Thyroid disease Neg Hx   . Stroke Brother   . Hypertension Brother   . Kidney disease Brother   . Kidney disease Brother   . Other Mother     Sepsis and perforated colon  . Kidney disease Mother   . Other Father     Motor vehicle accident  . Learning disabilities Son   . Kidney disease Maternal Aunt   . Kidney disease Maternal Grandmother      History   Social History  . Marital Status: Widowed    Spouse Name: N/A  . Number of Children: N/A  . Years of Education: N/A   Occupational History  . Not on file.   Social History Main Topics  . Smoking status: Former Smoker -- 1.00 packs/day for 50 years    Types: Cigarettes    Quit date: 07/13/1995  . Smokeless tobacco: Never Used     Comment: quit smoking around 1996  . Alcohol Use: No  . Drug Use: No  . Sexual Activity: No   Other Topics Concern  . Not on file   Social History Narrative   Lives in [place].     BP 136/50 mmHg  Pulse 61  Ht 5\' 7"  (1.702 m)  Wt 173 lb (78.472 kg)  BMI 27.09 kg/m2  Physical Exam:  cronically ill appearing [name],[name],NAD HEENT: Unremarkable Neck:  7 cm JVD, no thyromegally Back:  No CVA  tenderness Lungs:  Clear except for scattered basilar rales. No wheezes. No rhonchi. HEART:  Regular rate rhythm, no murmurs, no rubs, no clicks Abd:  soft, positive bowel sounds, no organomegally, no rebound, no guarding Ext:  2 plus pulses, 1+ bilaterally edema,  no cyanosis, no clubbing Skin:  No rashes no nodules Neuro:  CN II through XII intact, motor grossly intact   DEVICE  Normal device function.  See PaceArt for details. [name] has 2 years to ERI with a 8042 Medtronic device.  Assess/Plan:

## 2014-11-25 NOTE — Assessment & Plan Note (Signed)
Normal function has f/u with GT today  DDIR with underlying afib

## 2014-11-25 NOTE — Patient Instructions (Addendum)
Your physician recommends that you schedule a follow-up appointment in:  3 MONTHS WITH  DR NISHAN  Your physician recommends that you continue on your current medications as directed. Please refer to the Current Medication list given to you today.  

## 2014-11-25 NOTE — Assessment & Plan Note (Signed)
The patient has approx. 2 years of battery longevity. She does have a recall device and the recommendation has been to consider early change out in dependent patient's. However, the patient has multiple comorbidities and I think at the current time, the risk of a change out to the patient is greater then watching and waiting. Will follow.

## 2014-11-25 NOTE — Progress Notes (Signed)
Patient ID: [name], female   DOB: [birth year], 80 y.o.   MRN: [id number]            HPI: This is a 80 y.o. female patient who has history of CAD status post drug-eluting stent to the OM in 2006, rotational atherectomy and drug-eluting stent to the LAD in 6237, diastolic heart failure, paroxysmal atrial fibrillation status post pacemaker, and chronic kidney disease-stage IV hospital with acute on chronic congestive heart failure the presence of dietary indiscretion. She was diuresed approximately 10 pounds but this was limited do to renal failure. Dr. Arty Baumgartner follows her closely   The patient is doing much better at home. Physical therapy is working with her and she is starting to walk again. Her weight is down to 198 pounds(was 213 and an admission). She is taking Lasix 80 mg twice a day and metolazone 2.5 mg Mondays and Fridays. She denies any dyspnea, dyspnea on exertion, chest pain or edema. Her Coumadin will be checked by her primary care   D/C from hospital 08/13/14 with GI bleed Transfused Had EGD and capsule endoscopy but no colonoscopy  No source identified but ? Dieaulafoy's lesion that  Can be hard to ID unless EGD done during active bleeding    09/16/14 had left arm and hand swelling seen in ER Had superficial phlebitis and also needed dialysis for hyperkalemia and had supraRx INR   Discharge Cr was 2.8   Depressed still can't walk well.  Hated [facility] Wanted to go to [facility] but no room  Allergies  Allergen Reactions  . Amiodarone Hives  . Propylthiouracil Rash     Current Outpatient Prescriptions  Medication Sig Dispense Refill  . ALPRAZolam (XANAX) 0.5 MG tablet Take 1 tablet (0.5 mg total) by mouth at bedtime as needed for anxiety or sleep. 30 tablet 0  . camphor-menthol (SARNA) lotion Apply topically as needed for itching. (Patient taking differently: Apply 1 application topically daily as needed for itching. ) 222 mL 0  . colchicine 0.6 MG tablet Take 1 tablet (0.6  mg total) by mouth 2 (two) times daily as needed (Gout). 60 tablet 0  . ergocalciferol (VITAMIN D2) 50000 UNITS capsule Take 1 capsule (50,000 Units total) by mouth every Saturday. Take on saturdays 10 capsule 0  . Fluticasone-Salmeterol (ADVAIR DISKUS) 100-50 MCG/DOSE AEPB Inhale 1 puff into the lungs 2 (two) times daily. 1 each 2  . furosemide (LASIX) 80 MG tablet Take 1 tablet (80 mg total) by mouth 2 (two) times daily. 60 tablet 1  . hydrALAZINE (APRESOLINE) 25 MG tablet Take 1 tablet (25 mg total) by mouth 3 (three) times daily. 90 tablet 1  . isosorbide mononitrate (IMDUR) 60 MG 24 hr tablet Take 2 tablets (120 mg total) by mouth daily. 60 tablet 3  . levothyroxine (SYNTHROID, LEVOTHROID) 100 MCG tablet Take 1 tablet (100 mcg total) by mouth daily before breakfast. 30 tablet 2  . loratadine (CLARITIN) 10 MG tablet Take 1 tablet (10 mg total) by mouth daily. 30 tablet 1  . lovastatin (MEVACOR) 10 MG tablet Take 1 tablet (10 mg total) by mouth at bedtime. 30 tablet 2  . magnesium oxide (MAG-OX) 400 MG tablet Take 1 tablet (400 mg total) by mouth at bedtime. 30 tablet 0  . metolazone (ZAROXOLYN) 2.5 MG tablet Take 1 tablet (2.5 mg total) by mouth 2 (two) times a week. Monday and Friday (Patient taking differently: Take 2.5 mg by mouth as needed. Monday and Friday) 30 tablet 1  .  nitroGLYCERIN (NITROSTAT) 0.4 MG SL tablet Place 1 tablet (0.4 mg total) under the tongue every 5 (five) minutes as needed for chest pain. 30 tablet 12  . Nutritional Supplements (FEEDING SUPPLEMENT, NEPRO CARB STEADY,) LIQD Take 237 mLs by mouth 2 (two) times daily between meals. 24 Can 0  . ondansetron (ZOFRAN) 4 MG tablet Take 1 tablet (4 mg total) by mouth every 8 (eight) hours as needed for nausea or vomiting. 20 tablet 0  . oxyCODONE-acetaminophen (PERCOCET/ROXICET) 5-325 MG per tablet Take 1 tablet by mouth every 6 (six) hours as needed for severe pain. 30 tablet 0  . pantoprazole (PROTONIX) 40 MG tablet Take 1  tablet (40 mg total) by mouth 2 (two) times daily. 60 tablet 3  . promethazine (PHENERGAN) 25 MG tablet Take 1 tablet (25 mg total) by mouth every 8 (eight) hours as needed for nausea or vomiting. 30 tablet 1  . warfarin (COUMADIN) 1 MG tablet Take 1 tablet (1 mg total) by mouth daily at 6 PM. 30 tablet 2  . [DISCONTINUED] FLUoxetine (PROZAC) 20 MG capsule Take 20 mg by mouth daily.       No current facility-administered medications for this visit.    Past Medical History  Diagnosis Date  . Cellulitis and abscess of unspecified site     a. h/o MSRA cellulitis of abdominal wall.  Marland Kitchen CAD (coronary artery disease)     a. DES to OM 01/2005. b. Rotational atherotomy/DES to prox LAD 02/2008. c. Cath 07/2013: stable, for med rx.  . Edema   . Warfarin anticoagulation     a. Followed by PCP.  Marland Kitchen GI bleed     a. EGD 01/2007: antral ulcers related to NSAID/aspirin use.  . Peripheral vascular disease   . Hyperthyroidism     a. Hx of intolerance to PTU.  Marland Kitchen Blood transfusion years ago    "heart problems" (09/26/2013)  . H/O hiatal hernia   . Umbilical hernia     unrepaired (11/24/11)  . Valvular heart disease     Echo 11/2011: mild-mod MR.  Marland Kitchen Hypertension     takes Amlodipine,Hydralazine,and Imdur daily  . Hyperlipidemia     takes Lovastatin daily  . SSS (sick sinus syndrome)     a. H/o AV node ablation 2008 secondary to difficult to control afib/flutter, with subsequent pacer.  . Atrial fibrillation with rapid ventricular response     a. H/o amiodarone thyroiditis. b. s/p AV node ablation 2008 with implantation of PPM 01/2007, upgraded to CRT-P 06/2007.;takes Coumadin daily  . GERD (gastroesophageal reflux disease)     takes Protonix daily  . Chronic diastolic CHF (congestive heart failure)     takes Lasix daily and Spironolactone   . Pacemaker     [employer]tronic  . History of gout   . Hemorrhoids   . Depression with anxiety     takes Xanax daily  . Insomnia     takes Xanax if needed  . Heart  murmur   . CKD (chronic kidney disease)     Cr ~2.3.  . Kidney stones   . Myocardial infarction     "I've had 2-3; last one was in /1996" (09/26/2013)  . Migraines     "last one was a pretty good while ago" (09/26/2013)  . Arthritis     "all over" (09/26/2013)  . Chronic lower back pain     deteriorating disc  . Iron deficiency anemia     takes an Iron Pill daily  . Pneumonia  Past Surgical History  Procedure Laterality Date  . Esophagogastroduodenoscopy    . Tonsillectomy  ~ 1960  . Laminectomy and microdiscectomy lumbar spine  08/2003  . Cardioversion  10/2004; 02/2007    /E-chart  . Cholecystectomy    . Appendectomy  1954  . Vaginal hysterectomy  ?1981  . Av node ablation  02/2007    /E-chart  . Insert / replace / remove pacemaker  01/2007    initial placement  . Insert / replace / remove pacemaker  06/2007    upgraded/E-chart  . Av fistula placement, brachiocephalic Right 11/1322    "didn't work; my veins were too small"  . Joint replacement    . Total knee arthroplasty Left 01/2009  . Bladder and bowel  1970's    "tacked; damaged my intestines & had to remove some; done @ [facility]  . Cataract extraction w/ intraocular lens  implant, bilateral Bilateral   . Bascilic vein transposition Right 06/21/2013    Procedure: [place];  Surgeon: Serafina Mitchell, MD;  Location: [facility] OR;  Service: Vascular;  Laterality: Right;  . Esophagogastroduodenoscopy N/A 07/19/2013    Procedure: ESOPHAGOGASTRODUODENOSCOPY (EGD);  Surgeon: Lafayette Dragon, MD;  Location: [facility] ENDOSCOPY;  Service: Endoscopy;  Laterality: N/A;  . Balloon dilation N/A 07/19/2013    Procedure: BALLOON DILATION;  Surgeon: Lafayette Dragon, MD;  Location: [facility] ENDOSCOPY;  Service: Endoscopy;  Laterality: N/A;  . Esophagogastroduodenoscopy N/A 07/20/2013    Procedure: ESOPHAGOGASTRODUODENOSCOPY (EGD);  Surgeon: Lafayette Dragon, MD;  Location[facility] ENDOSCOPY;  Service: Endoscopy;  Laterality: N/A;  with  removal of polyp in pylorus  . Bascilic vein transposition Right 09/20/2013    Procedure: 2ND STAGE RIGHT BASCILIC VEIN TRANSPOSITION; ULTRSOUND GUIDED;  Surgeon: Serafina Mitchell, MD;  Location: [facility] OR;  Service: Vascular;  Laterality: Right;  . Cardiac catheterization  2006; 2008; 2009; 01/13/2011  . Coronary angioplasty with stent placement  ~ 2007; ?    "had a total of 2 stents put in" 92/07/2014)  . Back surgery    . Left heart catheterization with coronary angiogram N/A 07/15/2013    Procedure: LEFT HEART CATHETERIZATION WITH CORONARY ANGIOGRAM;  Surgeon: Blane Ohara, MD;  Location: [facility] CATH LAB;  Service: Cardiovascular;  Laterality: N/A;  . Esophagogastroduodenoscopy N/A 07/25/2014    Procedure: ESOPHAGOGASTRODUODENOSCOPY (EGD);  Surgeon: Irene Shipper, MD;  Location: [facility] ENDOSCOPY;  Service: Endoscopy;  Laterality: N/A;  . Esophagogastroduodenoscopy N/A 08/09/2014    Procedure: ESOPHAGOGASTRODUODENOSCOPY (EGD);  Surgeon: Inda Castle, MD;  Location: [facility] ENDOSCOPY;  Service: Endoscopy;  Laterality: N/A;  Possible enteroscopy  . Givens capsule study N/A 08/11/2014    Procedure: GIVENS CAPSULE STUDY;  Surgeon: Inda Castle, MD;  Location: [facility] ENDOSCOPY;  Service: Endoscopy;  Laterality: N/A;  . Colonoscopy  last 2011    Family History  Problem Relation Age of Onset  . Colon cancer Neg Hx   . Thyroid disease Neg Hx   . Stroke Brother   . Hypertension Brother   . Kidney disease Brother   . Kidney disease Brother   . Other Mother     Sepsis and perforated colon  . Kidney disease Mother   . Other Father     Motor vehicle accident  . Learning disabilities Son   . Kidney disease Maternal Aunt   . Kidney disease Maternal Grandmother     History   Social History  . Marital Status: Widowed    Spouse Name: N/A  .  Number of Children: N/A  . Years of Education: N/A   Occupational History  . Not on file.   Social History Main Topics  . Smoking status: Former Smoker -- 1.00  packs/day for 50 years    Types: Cigarettes    Quit date: 07/13/1995  . Smokeless tobacco: Never Used     Comment: quit smoking around 1996  . Alcohol Use: No  . Drug Use: No  . Sexual Activity: No   Other Topics Concern  . Not on file   Social History Narrative   Lives in [place].    ROS: See history of present illness otherwise negative  BP 136/50 mmHg  Pulse 61  Ht 5\' 7"  (1.702 m)  Wt 173 lb (78.472 kg)  BMI 27.09 kg/m2  PHYSICAL EXAM: Well-nournished, in no acute distress. Neck: No JVD, HJR, Bruit, or thyroid enlargement  Lungs: Decreased breath sounds at the bases but No tachypnea, clear without wheezing, rales, or rhonchi  Cardiovascular: Irregular irregular, PMI not displaced, 2/6 systolic murmur at the left sternal border, no gallops, bruit, thrill, or heave.  Abdomen: BS normal. Soft without organomegaly, masses, lesions or tenderness.  Extremities: Mild edema bilaterally with brawny changes decreased distal pulses  SKin: left arm warm and erythematous up to mid forearm  Pain to palpation hand and wrist  Erythema over dorsal aspect of arm   Musculoskeletal: No deformities  Neuro: no focal signs   Wt Readings from Last 3 Encounters:  11/25/14 173 lb (78.472 kg)  10/20/14 165 lb (74.844 kg)  09/19/14 179 lb 4.8 oz (81.33 kg)     EKG: Paced rhythm underlying atrial fibrillation

## 2014-11-25 NOTE — Assessment & Plan Note (Signed)
Her bleeding is stable. I am concerned about additional GI bleeding but also about another stroke. I will defer any decision to stop Warfarin to Dr. Johnsie Cancel and Olevia Perches.

## 2014-11-25 NOTE — Patient Instructions (Signed)
Your physician recommends that you schedule a follow-up appointment in: 3 months will check her device at her follow up appointment with Dr Johnsie Cancel

## 2014-11-25 NOTE — Assessment & Plan Note (Addendum)
Well controlled.  Continue current medications and low sodium Dash type diet.    

## 2014-11-25 NOTE — Assessment & Plan Note (Signed)
Chonic afib  F/u coumadin clinic rencet supraRx level no current bleeding but issues with GI bleeding in past source not identified

## 2014-11-25 NOTE — Assessment & Plan Note (Signed)
Good fistula in RUE  F/u renal recent hyperkalemia

## 2014-12-01 ENCOUNTER — Ambulatory Visit (INDEPENDENT_AMBULATORY_CARE_PROVIDER_SITE_OTHER): Payer: Medicare Other | Admitting: Internal Medicine

## 2014-12-01 ENCOUNTER — Encounter: Payer: Self-pay | Admitting: Internal Medicine

## 2014-12-01 VITALS — BP 134/60 | HR 60 | Ht 67.0 in | Wt 179.0 lb

## 2014-12-01 DIAGNOSIS — R0602 Shortness of breath: Secondary | ICD-10-CM | POA: Insufficient documentation

## 2014-12-01 NOTE — Patient Instructions (Addendum)
ICD-9-CM ICD-10-CM   1. SOB (shortness of breath) 786.05 R06.02     Shortness of breath is due to weight , physical deconditioning and heart issues Glad is improved/sresolved You might have asthma contributing Glad you are stable with passage of time, PT and advair  PLAN Continue home exercise ContinueAdvair 100/50,  1 puff twice daily  Followup  As needed with pulmonary Otherwise followup with other doctors and  Tamsen Roers, MD your PCP

## 2014-12-01 NOTE — Progress Notes (Signed)
Subjective:    Patient ID: [name], female    DOB: 01/03/1935, 80 y.o.   MRN: [id number] PCP Tamsen Roers, MD  HPI   PCP Tamsen Roers, MD Referred bu  DR Johnsie Cancel   HPI  [name] is a 80 y.o. female with a hx of CAD,  reports that she quit smoking about 18 years ago.  She has a 50 pack-year smoking history.s/p DES to the OM in 6/06, rotational atherectomy + DES to pLAD in 7/09, symptomatic bradycardia s/p pacemaker with upgrade to CRT-P 11/08, AFib s/p AVN ablation (prior amiodarone thyroiditis), chronic diastolic CHF, mitral regurgitation, CKD stage IV, HTN, HL, GERD, prior UGI bleed in 2008 related to NSAID use, PVD. She has had a difficult time with access for hemodialysis. She had a brachial vein transposition in 09/2013.   She was seen in 06/2013 with worsening chest pain consistent with CCS class III angina. She was ultimately set up for cardiac catheterization that demonstrated patent stent in the LAD and obtuse marginal 1 with nonobstructive disease elsewhere. Medical therapy was continued.  She was admitted in 07/2013 with progressive dysphagia. EGD demonstrated large prepyloric ulcer and polyp which was removed. She had esophageal dilatation.   Admitted in 09/2013 with acute on chronic renal failure in the setting of nausea, vomiting and anorexia with poor by mouth intake. Last seen by Dr. Johnsie Cancel 10/2013. She was recently seen by gastroenterology with persistent nausea as well as abdominal bloating, progressive dyspnea and orthopnea. Abdominal CT demonstrated groundglass opacity in the right middle lobe suspicious for pneumonia with trace effusion as well as nodular opacity in the left lung base suspicious for an infectious/inflammatory process, scattered hepatic cysts measuring up to 17 mm, findings consistent with polycystic kidney disease. No ascites was noted. Liver size was normal. BNP remained elevated at 1368.    She presented to cardiology in 11/01/2013 and she  reported  She has had significant cough over the last couple of weeks. She described yellow sputum. She notes poor appetite. She sometimes coughs hard enough to vomit. She also has vomiting with certain meals. She denies hemoptysis. She denies significant fever. She feels weak. She notes increased shortness of breath. She probably described NYHA class III symptoms. She denies orthopnea. She has noted PND as well as edema.   At this time CT scan of the abdomen lung cut showed findings of possible pneumonia [see below]. She was then discharged with 7 days of Avelox. And she was referred to pulmonary. Today 11/08/2013 she presents for pulmonary followup in consultation. She reports that her cough is significantly improved at least over 70% since starting antibiotics. There is some residual cough with mild associated postnasal drainage. However, her main concern is dyspnea since December 2014 as of insidious onset and slowly progressive. Probably New [place] Heart Association class III. Relieved by rest. The last several weeks his increasing edema.  REC #Pneumonia and lung nodule on CT  - repeat CT chest wo contrast in 2 months  #Shortness of breath  - do ONO Test on Room Air ASAP n-  ONO 11/25/13 shows pulse ox < 88% at 45min and 20 sec. - > O2 not started at followup  - do PFT breathing test next several days/few weeks - do duplex Lower Extremity rule out DVT -> NEGATIVE 11/22/13  #Followup  - next few weeks after you complete tests in the dyspnea section  - bring all all your medicines for med calendar with NP Tammy  - Tammy  will also review your test results  12/26/13 Follow up  Returns for follow up  Returns for follow up and med review .  We reviewed all meds and organized them into a med calendar with pt education.  Had PFT today >PFTs showed FEV1 of 1.48 L, 66% of predicted, ratio of 80, no significant bronchodilator response,  diffusing capacity 71%, FVC 62% Has upcoming CT next weeek.  Does  complains of bloating in stomach and no appetite .  No chest pain, orthopnea or edema, fever, or vomiting.  Says her breathing is not to bad, does get winded at times but main issue she feels is her stomach.    Studies:  - Myoview (01/2009): EF 79%, no scar or ischemia.  - Echo (03/2013): Mild focal basal septal hypertrophy, EF 55-60%, mild MR, moderate to severe LAE, mild to moderate RVE, reduced RVSF, moderate RAE.  - LHC (07/14/13): pLAD stent ok, LAD 30-40, ostial first septal perf 80, OM1 stent ok with 20 ISR, AVCFX 20-30, prox to mid RCA 30-40. Med Rx.  Recent Labs:  10/27/2013: ALT 11; Creatinine 2.68*; Hemoglobin 12.7; Potassium 4.6  10/28/2013: Pro B Natriuretic peptide (BNP) 1368.0*  10/27/2013: POC INR 2.16*; WBC 7.1  Abdominal CT (10/2013):  IMPRESSION: Scattered hepatic cysts measuring up to 17 mm. Liver is otherwise unremarkable on unenhanced CT. 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 Mild patchy/ground-glass opacity in the right middle lobe, suspicious for pneumonia. Trace right pleural effusion. 11 x 7 mm patchy/nodular opacity at the left lung base, possibly infectious/inflammatory, although indeterminate. Consider follow-up CT chest in 3 months to assess for persistence.   OV 01/27/2014  Chief Complaint  Patient presents with  . Follow-up    Pt states breathing is unchanged since last OV. C/o dyspnea with activity and mild dry cough. Pt denied CP/tightness. Pt states she has intermittent episodes of emesis lasting 2-3 days at a time. Pt denies diarrhea.    Followup dyspnea. She presents with her son.  - A pulmonary function test shows restricted spirometry but normal lung volumes and normal diffusion capacity. Overnight desaturation test was positive for desaturation but it is over 30 days and she is not on oxygen. Duplex lower extremity was negative for DVT. She continues to be dyspneic.  She is mostly  sedentary and very deconditioned. She uses a walker to get around and gets dyspneic class III. She is aware of a murmur in her heart but she says that there is no intervention planned. She also never had physical therapy after her discharge from the hospital in early 2015.  IMPRESSION CT chest Minimal residual curvilinear bibasilar scarring or less likely  atelectasis. Previously seen areas of more nodular consolidation  have resolved. No new acute cardiopulmonary process.  Cardiomegaly.  Electronically Signed  By: Conchita Paris M.D.  On: 01/16/2014 13:56   REC Shortness of breath is due to weight , physical deconditioning and heart issues I am not convinced that you have a pulmonary problem We will set up home physical therapy We will test her oxygen at night when he is sleeping through an overnight oxygen study Try Advair 100/50,  1 puff twice daily  Followup  6-8 weeks to report progress   OV 04/25/2014  Chief Complaint  Patient presents with  . Follow-up    Pt states SOB is improved after doing PT, and using advair.    Fu multifactorial dyspnea - she is better 25% better she says with  time, advair and PT. Doing bike exercisesa at home. Thinks advair helps. No new issues. Has had flu shot. She is siting in wheel chair in office   OV 12/01/2014   Chief Complaint  Patient presents with  . Follow-up    Pt states SOB has improved. C/o occasional dry cough. Denies any chest congestion/tightness.   Fu multifactorial dyspnea - this is resolved. Thinks advair helps. No new issues. . She is siting in wheel chair in office. Issue according to son is profoudnd deconditioning esp folowing dec 2015 admission for GI bleed. PT at home helps. No new issues    has a past medical history of Cellulitis and abscess of unspecified site; CAD (coronary artery disease); Edema; Warfarin anticoagulation; GI bleed; Peripheral vascular disease; Hyperthyroidism; Blood transfusion (years ago); H/O  hiatal hernia; Umbilical hernia; Valvular heart disease; Hypertension; Hyperlipidemia; SSS (sick sinus syndrome); Atrial fibrillation with rapid ventricular response; GERD (gastroesophageal reflux disease); Chronic diastolic CHF (congestive heart failure); Pacemaker; History of gout; Hemorrhoids; Depression with anxiety; Insomnia; Heart murmur; CKD (chronic kidney disease); Kidney stones; Myocardial infarction; Migraines; Arthritis; Chronic lower back pain; Iron deficiency anemia; and Pneumonia.   reports that she quit smoking about 19 years ago. Her smoking use included Cigarettes. She has a 50 pack-year smoking history. She has never used smokeless tobacco.  Past Surgical History  Procedure Laterality Date  . Esophagogastroduodenoscopy    . Tonsillectomy  ~ 1960  . Laminectomy and microdiscectomy lumbar spine  08/2003  . Cardioversion  10/2004; 02/2007    /E-chart  . Cholecystectomy    . Appendectomy  1954  . Vaginal hysterectomy  ?1981  . Av node ablation  02/2007    /E-chart  . Insert / replace / remove pacemaker  01/2007    initial placement  . Insert / replace / remove pacemaker  06/2007    upgraded/E-chart  . Av fistula placement, brachiocephalic Right 0/2774    "didn't work; my veins were too small"  . Joint replacement    . Total knee arthroplasty Left 01/2009  . Bladder and bowel  1970's    "tacked; damaged my intestines & had to remove some; done @ [facility]  . Cataract extraction w/ intraocular lens  implant, bilateral Bilateral   . Bascilic vein transposition Right 06/21/2013    Procedure: [place];  Surgeon: Serafina Mitchell, MD;  Location: [place] [facility] OR;  Service: Vascular;  Laterality: Right;  . Esophagogastroduodenoscopy N/A 07/19/2013    Procedure: ESOPHAGOGASTRODUODENOSCOPY (EGD);  Surgeon: Lafayette Dragon, MD;  Location: [facility] ENDOSCOPY;  Service: Endoscopy;  Laterality: N/A;  . Balloon dilation N/A 07/19/2013    Procedure: BALLOON DILATION;  Surgeon: Lafayette Dragon, MD;  Location: [facility] ENDOSCOPY;  Service: Endoscopy;  Laterality: N/A;  . Esophagogastroduodenoscopy N/A 07/20/2013    Procedure: ESOPHAGOGASTRODUODENOSCOPY (EGD);  Surgeon: Lafayette Dragon, MD;  Location: [facility] ENDOSCOPY;  Service: Endoscopy;  Laterality: N/A;  with removal of polyp in pylorus  . Bascilic vein transposition Right 09/20/2013    Procedure: 2ND STAGE RIGHT BASCILIC VEIN TRANSPOSITION; ULTRSOUND GUIDED;  Surgeon: Serafina Mitchell, MD;  Location: [facility] OR;  Service: Vascular;  Laterality: Right;  . Cardiac catheterization  2006; 2008; 2009; 01/13/2011  . Coronary angioplasty with stent placement  ~ 2007; ?    "had a total of 2 stents put in" 92/07/2014)  . Back surgery    . Left heart catheterization with coronary angiogram N/A 07/15/2013    Procedure: LEFT HEART CATHETERIZATION WITH CORONARY  Cyril Loosen;  Surgeon: Blane Ohara, MD;  Location: [facility] CATH LAB;  Service: Cardiovascular;  Laterality: N/A;  . Esophagogastroduodenoscopy N/A 07/25/2014    Procedure: ESOPHAGOGASTRODUODENOSCOPY (EGD);  Surgeon: Irene Shipper, MD;  Location: [facility] ENDOSCOPY;  Service: Endoscopy;  Laterality: N/A;  . Esophagogastroduodenoscopy N/A 08/09/2014    Procedure: ESOPHAGOGASTRODUODENOSCOPY (EGD);  Surgeon: Inda Castle, MD;  Location: [facility] ENDOSCOPY;  Service: Endoscopy;  Laterality: N/A;  Possible enteroscopy  . Givens capsule study N/A 08/11/2014    Procedure: GIVENS CAPSULE STUDY;  Surgeon: Inda Castle, MD;  Location: [facility] ENDOSCOPY;  Service: Endoscopy;  Laterality: N/A;  . Colonoscopy  last 2011    Allergies  Allergen Reactions  . Amiodarone Hives  . Propylthiouracil Rash    Immunization History  Administered Date(s) Administered  . Influenza Split 03/28/2014  . Influenza-Unspecified 04/15/2013  . Pneumococcal Polysaccharide-23 07/15/2013    Family History  Problem Relation Age of Onset  . Colon cancer Neg Hx   . Thyroid disease Neg Hx   . Stroke Brother   . Hypertension Brother   .  Kidney disease Brother   . Kidney disease Brother   . Other Mother     Sepsis and perforated colon  . Kidney disease Mother   . Other Father     Motor vehicle accident  . Learning disabilities Son   . Kidney disease Maternal Aunt   . Kidney disease Maternal Grandmother      Current outpatient prescriptions:  .  ALPRAZolam (XANAX) 0.5 MG tablet, Take 1 tablet (0.5 mg total) by mouth at bedtime as needed for anxiety or sleep., Disp: 30 tablet, Rfl: 0 .  ergocalciferol (VITAMIN D2) 50000 UNITS capsule, Take 1 capsule (50,000 Units total) by mouth every Saturday. Take on saturdays, Disp: 10 capsule, Rfl: 0 .  furosemide (LASIX) 80 MG tablet, Take 1 tablet (80 mg total) by mouth 2 (two) times daily., Disp: 60 tablet, Rfl: 1 .  hydrALAZINE (APRESOLINE) 25 MG tablet, Take 1 tablet (25 mg total) by mouth 3 (three) times daily., Disp: 90 tablet, Rfl: 1 .  isosorbide mononitrate (IMDUR) 60 MG 24 hr tablet, Take 2 tablets (120 mg total) by mouth daily., Disp: 60 tablet, Rfl: 3 .  levothyroxine (SYNTHROID, LEVOTHROID) 100 MCG tablet, Take 1 tablet (100 mcg total) by mouth daily before breakfast., Disp: 30 tablet, Rfl: 2 .  loratadine (CLARITIN) 10 MG tablet, Take 1 tablet (10 mg total) by mouth daily., Disp: 30 tablet, Rfl: 1 .  lovastatin (MEVACOR) 10 MG tablet, Take 1 tablet (10 mg total) by mouth at bedtime., Disp: 30 tablet, Rfl: 2 .  metolazone (ZAROXOLYN) 2.5 MG tablet, Take 1 tablet (2.5 mg total) by mouth 2 (two) times a week. Monday and Friday, Disp: 30 tablet, Rfl: 1 .  nitroGLYCERIN (NITROSTAT) 0.4 MG SL tablet, Place 1 tablet (0.4 mg total) under the tongue every 5 (five) minutes as needed for chest pain., Disp: 30 tablet, Rfl: 12 .  ondansetron (ZOFRAN) 4 MG tablet, Take 1 tablet (4 mg total) by mouth every 8 (eight) hours as needed for nausea or vomiting., Disp: 20 tablet, Rfl: 0 .  oxyCODONE-acetaminophen (PERCOCET/ROXICET) 5-325 MG per tablet, Take 1 tablet by mouth every 6 (six) hours  as needed for severe pain., Disp: 30 tablet, Rfl: 0 .  pantoprazole (PROTONIX) 40 MG tablet, Take 1 tablet (40 mg total) by mouth 2 (two) times daily., Disp: 60 tablet, Rfl: 3 .  promethazine (PHENERGAN) 25 MG tablet, Take 1 tablet (25 mg  total) by mouth every 8 (eight) hours as needed for nausea or vomiting., Disp: 30 tablet, Rfl: 1 .  warfarin (COUMADIN) 1 MG tablet, Take 1 tablet (1 mg total) by mouth daily at 6 PM. (Patient taking differently: Take 0.5 mg by mouth daily at 6 PM. ), Disp: 30 tablet, Rfl: 2 .  camphor-menthol (SARNA) lotion, Apply topically as needed for itching. (Patient not taking: Reported on 12/01/2014), Disp: 222 mL, Rfl: 0 .  colchicine 0.6 MG tablet, Take 1 tablet (0.6 mg total) by mouth 2 (two) times daily as needed (Gout). (Patient not taking: Reported on 12/01/2014), Disp: 60 tablet, Rfl: 0 .  magnesium oxide (MAG-OX) 400 MG tablet, Take 1 tablet (400 mg total) by mouth at bedtime., Disp: 30 tablet, Rfl: 0 .  Nutritional Supplements (FEEDING SUPPLEMENT, NEPRO CARB STEADY,) LIQD, Take 237 mLs by mouth 2 (two) times daily between meals. (Patient not taking: Reported on 12/01/2014), Disp: 24 Can, Rfl: 0 .  [DISCONTINUED] FLUoxetine (PROZAC) 20 MG capsule, Take 20 mg by mouth daily.  , Disp: , Rfl:     Review of Systems  Constitutional: Positive for unexpected weight change. Negative for fever.  HENT: Positive for sore throat. Negative for congestion, dental problem, ear pain, nosebleeds, postnasal drip, rhinorrhea, sinus pressure, sneezing and trouble swallowing.   Eyes: Positive for itching. Negative for redness.  Respiratory: Positive for cough. Negative for chest tightness, shortness of breath and wheezing.   Cardiovascular: Negative for palpitations and leg swelling.  Genitourinary: Negative for dysuria.  Neurological: Negative for headaches.  Hematological: Bruises/bleeds easily.  Psychiatric/Behavioral: Negative for dysphoric mood.       Objective:   Physical  Exam  Filed Vitals:   12/01/14 1653  BP: 134/60  Pulse: 60  Height: 5\' 7"  (1.702 m)  Weight: 179 lb (81.194 kg)  SpO2: 95%    Vitals reviewed. Constitutional: She is oriented to person, place, and time. She appears well-developed and well-nourished. No distress.  Obese Sitting in wheel chair Looks deconditoned HENT:  Head: Normocephalic and atraumatic.  Right Ear: External ear normal.  Left Ear: External ear normal.  Mouth/Throat: Oropharynx is clear and moist. No oropharyngeal exudate.  Eyes: Conjunctivae and EOM are normal. Pupils are equal, round, and reactive to light. Right eye exhibits no discharge. Left eye exhibits no discharge. No scleral icterus.  Neck: Normal range of motion. Neck supple. No JVD present. No tracheal deviation present. No thyromegaly present.  Cardiovascular: Normal rate, regular rhythm, normal heart sounds and intact distal pulses.  Exam reveals no gallop and no friction rub.   No murmur heard. Pulmonary/Chest: Effort normal and breath sounds normal. No respiratory distress. She has no wheezes. She has no rales. She exhibits no tenderness.   Abdominal: Soft. Bowel sounds are normal. She exhibits no distension and no mass. There is no tenderness. There is no rebound and no guarding.  Musculoskeletal: Normal range of motion. She exhibits no edema and no tenderness.  Sitting in wheel chair  Lymphadenopathy:    She has no cervical adenopathy.  Neurological: She is alert and oriented to person, place, and time. She has normal reflexes. No cranial nerve deficit. She exhibits normal muscle tone. Coordination normal.  Skin: Skin is warm and dry. No rash noted. She is not diaphoretic. No erythema. No pallor.  Psychiatric: Judgment and thought content normal.         Assessment & Plan:     ICD-9-CM ICD-10-CM   1. SOB (shortness of breath) 786.05 R06.02  Shortness of breath is due to weight , physical deconditioning and heart issues Glad is  improved/sresolved You might have asthma contributing Glad you are stable with passage of time, PT and advair  PLAN Continue home exercise ContinueAdvair 100/50,  1 puff twice daily  Followup  As needed with pulmonary Otherwise followup with other doctors and  Tamsen Roers, MD your PCP   Dr. Brand Males, M.D., [facility].C.P Pulmonary and Critical Care Medicine Staff Physician [place] [facility] Pager: [phone], If no answer or between  15:00h - 7:00h: call [phone]  12/01/2014 6:30 PM

## 2014-12-18 ENCOUNTER — Other Ambulatory Visit: Payer: Self-pay | Admitting: Cardiovascular Disease

## 2015-01-19 ENCOUNTER — Telehealth: Payer: Self-pay | Admitting: Cardiovascular Disease

## 2015-01-19 NOTE — Telephone Encounter (Signed)
Ok to have tooth extraction woud stop coumadin 4 days before procedure

## 2015-01-19 NOTE — Telephone Encounter (Signed)
WILL FORWARD  TO DR Johnsie Cancel  FOR  RECOMMENDATIONS./CY

## 2015-01-19 NOTE — Telephone Encounter (Signed)
FAXED THIS  PHONE  NOTE  TO  DR  Gae Bon AS  REQUESTED .Adonis Housekeeper

## 2015-01-19 NOTE — Telephone Encounter (Signed)
Request for surgical clearance:  What type of surgery is being performed? Tooth extraction  1. When is this surgery scheduled? Pending clearance  2. Are there any medications that need to be held prior to surgery and how long?Warfarin need to know how long   3. Name of physician performing surgery? Dr Gae Bon   4. What is your office phone and fax number? [phone] fax.  5.

## 2015-02-04 ENCOUNTER — Other Ambulatory Visit: Payer: Self-pay | Admitting: Cardiovascular Disease

## 2015-02-24 ENCOUNTER — Telehealth: Payer: Self-pay | Admitting: *Deleted

## 2015-02-24 NOTE — Telephone Encounter (Signed)
Patient made aware that per AVS from 11/25/2014 appointment with Dr. Lovena Le, patient is to be seen by [facility] instead of by Dr. Lovena Le at appointment on 02/25/2015.  Patient agreeable and aware of updated appointment time.  Patient denies any questions or concerns at this time.

## 2015-02-24 NOTE — Progress Notes (Signed)
Patient ID: [name], female   DOB: 1934/09/08, 80 y.o.   MRN: [id number]            HPI: This is a 80 y.o. female patient who has history of CAD status post drug-eluting stent to the OM in 2006, rotational atherectomy and drug-eluting stent to the LAD in 6629, diastolic heart failure, paroxysmal atrial fibrillation status post pacemaker, and chronic kidney disease-stage IV hospital with acute on chronic congestive heart failure the presence of dietary indiscretion. She was diuresed approximately 10 pounds but this was limited do to renal failure. Dr. Arty Baumgartner follows her closely   The patient is doing much better at home. Physical therapy is working with her and she is starting to walk again. Her weight is down to 198 pounds(was 213 and an admission). She is taking Lasix 80 mg twice a day and metolazone 2.5 mg Mondays and Fridays. She denies any dyspnea, dyspnea on exertion, chest pain or edema. Her Coumadin will be checked by her primary care   D/C from hospital 08/13/14 with GI bleed Transfused Had EGD and capsule endoscopy but no colonoscopy  No source identified but ? Dieaulafoy's lesion that  Can be hard to ID unless EGD done during active bleeding    09/16/14 had left arm and hand swelling seen in ER Had superficial phlebitis and also needed dialysis for hyperkalemia and had supraRx INR   Discharge Cr was 2.8   Depressed still can't walk well.  Hated [facility] Wanted to go to [facility] but no room  Last echo 05/08/14 reviewed: Study Conclusions  - Left ventricle: The cavity size was normal. Systolic function was normal. The estimated ejection fraction was in the range of 55% to 60%. Wall motion was normal; there were no regional wall motion abnormalities. - Aortic valve: Trileaflet; normal thickness, mildly calcified leaflets. There was trivial regurgitation. - Mitral valve: There was moderate regurgitation. - Left atrium: The atrium was severely dilated. - Right  ventricle: The cavity size was severely dilated. Wall thickness was normal. - Right atrium: The atrium was severely dilated. - Tricuspid valve: There was moderate-severe regurgitation. - Pulmonic valve: There was trivial regurgitation. - Pulmonary arteries: PA peak pressure: 44 mm Hg (S).  Impressions:  - The right ventricular systolic pressure was increased consistent with moderate pulmonary hypertension.   Still losing weight.  Pacer checked to day and on recall but functioning normally.  No CHF parameters on her model  Allergies  Allergen Reactions  . Amiodarone Hives  . Propylthiouracil Rash     Current Outpatient Prescriptions  Medication Sig Dispense Refill  . ALPRAZolam (XANAX) 0.5 MG tablet Take 1 tablet (0.5 mg total) by mouth at bedtime as needed for anxiety or sleep. 30 tablet 0  . camphor-menthol (SARNA) lotion Apply 1 application topically as needed for itching.    . ergocalciferol (VITAMIN D2) 50000 UNITS capsule Take 1 capsule (50,000 Units total) by mouth every Saturday. Take on saturdays 10 capsule 0  . furosemide (LASIX) 80 MG tablet Take 1 tablet (80 mg total) by mouth 2 (two) times daily. 60 tablet 1  . hydrALAZINE (APRESOLINE) 25 MG tablet TAKE 1 TABLET BY MOUTH 3 TIMES A DAY 270 tablet 0  . isosorbide mononitrate (IMDUR) 60 MG 24 hr tablet Take 2 tablets (120 mg total) by mouth daily. 60 tablet 3  . levothyroxine (SYNTHROID, LEVOTHROID) 100 MCG tablet Take 1 tablet (100 mcg total) by mouth daily before breakfast. 30 tablet 2  . loratadine (CLARITIN) 10  MG tablet Take 1 tablet (10 mg total) by mouth daily. 30 tablet 1  . lovastatin (MEVACOR) 10 MG tablet Take 1 tablet (10 mg total) by mouth at bedtime. 30 tablet 2  . magnesium oxide (MAG-OX) 400 MG tablet Take 1 tablet (400 mg total) by mouth at bedtime. 30 tablet 0  . metolazone (ZAROXOLYN) 2.5 MG tablet Take 1 tablet (2.5 mg total) by mouth 2 (two) times a week. Monday and Friday 30 tablet 1  .  nitroGLYCERIN (NITROSTAT) 0.4 MG SL tablet Place 1 tablet (0.4 mg total) under the tongue every 5 (five) minutes as needed for chest pain. 30 tablet 12  . Nutritional Supplements (FEEDING SUPPLEMENT, NEPRO CARB STEADY,) LIQD Take 237 mLs by mouth 2 (two) times daily between meals. 24 Can 0  . ondansetron (ZOFRAN) 4 MG tablet Take 1 tablet (4 mg total) by mouth every 8 (eight) hours as needed for nausea or vomiting. 20 tablet 0  . oxyCODONE-acetaminophen (PERCOCET/ROXICET) 5-325 MG per tablet Take 1 tablet by mouth every 6 (six) hours as needed for severe pain. 30 tablet 0  . pantoprazole (PROTONIX) 40 MG tablet Take 1 tablet (40 mg total) by mouth 2 (two) times daily. 60 tablet 3  . promethazine (PHENERGAN) 25 MG tablet Take 1 tablet (25 mg total) by mouth every 8 (eight) hours as needed for nausea or vomiting. 30 tablet 1  . warfarin (COUMADIN) 1 MG tablet Take 1 tablet (1 mg total) by mouth daily at 6 PM. (Patient taking differently: Take 0.5 mg by mouth daily at 6 PM. ) 30 tablet 2  . [DISCONTINUED] FLUoxetine (PROZAC) 20 MG capsule Take 20 mg by mouth daily.       No current facility-administered medications for this visit.    Past Medical History  Diagnosis Date  . Cellulitis and abscess of unspecified site     a. h/o MSRA cellulitis of abdominal wall.  Marland Kitchen CAD (coronary artery disease)     a. DES to OM 01/2005. b. Rotational atherotomy/DES to prox LAD 02/2008. c. Cath 07/2013: stable, for med rx.  . Edema   . Warfarin anticoagulation     a. Followed by PCP.  Marland Kitchen GI bleed     a. EGD 01/2007: antral ulcers related to NSAID/aspirin use.  . Peripheral vascular disease   . Hyperthyroidism     a. Hx of intolerance to PTU.  Marland Kitchen Blood transfusion years ago    "heart problems" (09/26/2013)  . H/O hiatal hernia   . Umbilical hernia     unrepaired (11/24/11)  . Valvular heart disease     Echo 11/2011: mild-mod MR.  Marland Kitchen Hypertension     takes Amlodipine,Hydralazine,and Imdur daily  . Hyperlipidemia       takes Lovastatin daily  . SSS (sick sinus syndrome)     a. H/o AV node ablation 2008 secondary to difficult to control afib/flutter, with subsequent pacer.  . Atrial fibrillation with rapid ventricular response     a. H/o amiodarone thyroiditis. b. s/p AV node ablation 2008 with implantation of PPM 01/2007, upgraded to CRT-P 06/2007.;takes Coumadin daily  . GERD (gastroesophageal reflux disease)     takes Protonix daily  . Chronic diastolic CHF (congestive heart failure)     takes Lasix daily and Spironolactone   . Pacemaker     [employer]onic  . History of gout   . Hemorrhoids   . Depression with anxiety     takes Xanax daily  . Insomnia     takes  Xanax if needed  . Heart murmur   . CKD (chronic kidney disease)     Cr ~2.3.  . Kidney stones   . Myocardial infarction     "I've had 2-3; last one was in /1996" (09/26/2013)  . Migraines     "last one was a pretty good while ago" (09/26/2013)  . Arthritis     "all over" (09/26/2013)  . Chronic lower back pain     deteriorating disc  . Iron deficiency anemia     takes an Iron Pill daily  . Pneumonia     Past Surgical History  Procedure Laterality Date  . Esophagogastroduodenoscopy    . Tonsillectomy  ~ 1960  . Laminectomy and microdiscectomy lumbar spine  08/2003  . Cardioversion  10/2004; 02/2007    /E-chart  . Cholecystectomy    . Appendectomy  1954  . Vaginal hysterectomy  ?1981  . Av node ablation  02/2007    /E-chart  . Insert / replace / remove pacemaker  01/2007    initial placement  . Insert / replace / remove pacemaker  06/2007    upgraded/E-chart  . Av fistula placement, brachiocephalic Right 09/7251    "didn't work; my veins were too small"  . Joint replacement    . Total knee arthroplasty Left 01/2009  . Bladder and bowel  1970's    "tacked; damaged my intestines & had to remove some; done @ [facility]  . Cataract extraction w/ intraocular lens  implant, bilateral Bilateral   . Bascilic vein transposition Right  06/21/2013    Procedure: [place];  Surgeon: Serafina Mitchell, MD;  Location: [facility] OR;  Service: Vascular;  Laterality: Right;  . Esophagogastroduodenoscopy N/A 07/19/2013    Procedure: ESOPHAGOGASTRODUODENOSCOPY (EGD);  Surgeon: Lafayette Dragon, MD;  Location: [facility] ENDOSCOPY;  Service: Endoscopy;  Laterality: N/A;  . Balloon dilation N/A 07/19/2013    Procedure: BALLOON DILATION;  Surgeon: Lafayette Dragon, MD;  Location: [facility] ENDOSCOPY;  Service: Endoscopy;  Laterality: N/A;  . Esophagogastroduodenoscopy N/A 07/20/2013    Procedure: ESOPHAGOGASTRODUODENOSCOPY (EGD);  Surgeon: Lafayette Dragon, MD;  Location: [facility] ENDOSCOPY;  Service: Endoscopy;  Laterality: N/A;  with removal of polyp in pylorus  . Bascilic vein transposition Right 09/20/2013    Procedure: 2ND STAGE RIGHT BASCILIC VEIN TRANSPOSITION; ULTRSOUND GUIDED;  Surgeon: Serafina Mitchell, MD;  Location: [facility] OR;  Service: Vascular;  Laterality: Right;  . Cardiac catheterization  2006; 2008; 2009; 01/13/2011  . Coronary angioplasty with stent placement  ~ 2007; ?    "had a total of 2 stents put in" 92/07/2014)  . Back surgery    . Left heart catheterization with coronary angiogram N/A 07/15/2013    Procedure: LEFT HEART CATHETERIZATION WITH CORONARY ANGIOGRAM;  Surgeon: Blane Ohara, MD;  Location: [facility] CATH LAB;  Service: Cardiovascular;  Laterality: N/A;  . Esophagogastroduodenoscopy N/A 07/25/2014    Procedure: ESOPHAGOGASTRODUODENOSCOPY (EGD);  Surgeon: Irene Shipper, MD;  Location: [facility] ENDOSCOPY;  Service: Endoscopy;  Laterality: N/A;  . Esophagogastroduodenoscopy N/A 08/09/2014    Procedure: ESOPHAGOGASTRODUODENOSCOPY (EGD);  Surgeon: Inda Castle, MD;  Location: [facility] ENDOSCOPY;  Service: Endoscopy;  Laterality: N/A;  Possible enteroscopy  . Givens capsule study N/A 08/11/2014    Procedure: GIVENS CAPSULE STUDY;  Surgeon: Inda Castle, MD;  Location: [facility] ENDOSCOPY;  Service: Endoscopy;  Laterality: N/A;  . Colonoscopy  last  2011    Family History  Problem Relation Age of Onset  . Colon  cancer Neg Hx   . Thyroid disease Neg Hx   . Stroke Brother   . Hypertension Brother   . Kidney disease Brother   . Kidney disease Brother   . Other Mother     Sepsis and perforated colon  . Kidney disease Mother   . Other Father     Motor vehicle accident  . Learning disabilities Son   . Kidney disease Maternal Aunt   . Kidney disease Maternal Grandmother     History   Social History  . Marital Status: Widowed    Spouse Name: N/A  . Number of Children: N/A  . Years of Education: N/A   Occupational History  . Not on file.   Social History Main Topics  . Smoking status: Former Smoker -- 1.00 packs/day for 50 years    Types: Cigarettes    Quit date: 07/13/1995  . Smokeless tobacco: Never Used     Comment: quit smoking around 1996  . Alcohol Use: No  . Drug Use: No  . Sexual Activity: No   Other Topics Concern  . Not on file   Social History Narrative   Lives in [place].    ROS: See history of present illness otherwise negative  BP 138/56 mmHg  Pulse 61  Ht 5\' 7"  (1.702 m)  Wt 76.839 kg (169 lb 6.4 oz)  BMI 26.53 kg/m2  SpO2 96%  PHYSICAL EXAM: Well-nournished, in no acute distress. Neck: No JVD, HJR, Bruit, or thyroid enlargement  Lungs: Decreased breath sounds at the bases but No tachypnea, clear without wheezing, rales, or rhonchi  Cardiovascular: Irregular irregular, PMI not displaced, 2/6 systolic murmur at the left sternal border, no gallops, bruit, thrill, or heave.  Abdomen: BS normal. Soft without organomegaly, masses, lesions or tenderness.  Extremities: Mild edema bilaterally with brawny changes decreased distal pulses  SKin: pururitic rash on back with excoriations   Musculoskeletal: No deformities  Neuro: no focal signs   Wt Readings from Last 3 Encounters:  02/25/15 76.839 kg (169 lb 6.4 oz)  12/01/14 81.194 kg (179 lb)  11/25/14 78.472 kg (173 lb)     EKG:    09/16/14   Paced rhythm underlying atrial fibrillation   Assessment and Plan: Afib:good rate control with pacer back up  INR low Rx followed by primary  Pacer: Discussed with Dr Taylor/Charles reviewed PACEART normal function no CHF parameters available CAD: Stable with no angina and good activity level.  Continue medical Rx Edema:  Stable combination of varicose veins and CHF Rash:  Not clear etiology seeing dermatology tomorrow doubt related to coumadin   F/U with me in 6 months

## 2015-02-25 ENCOUNTER — Encounter: Payer: Self-pay | Admitting: Cardiovascular Disease

## 2015-02-25 ENCOUNTER — Ambulatory Visit (INDEPENDENT_AMBULATORY_CARE_PROVIDER_SITE_OTHER): Payer: Medicare Other | Admitting: *Deleted

## 2015-02-25 ENCOUNTER — Ambulatory Visit (INDEPENDENT_AMBULATORY_CARE_PROVIDER_SITE_OTHER): Payer: Medicare Other | Admitting: Cardiovascular Disease

## 2015-02-25 ENCOUNTER — Encounter: Payer: Medicare Other | Admitting: Internal Medicine

## 2015-02-25 VITALS — BP 138/56 | HR 61 | Ht 67.0 in | Wt 169.4 lb

## 2015-02-25 DIAGNOSIS — Z950 Presence of cardiac pacemaker: Secondary | ICD-10-CM | POA: Diagnosis not present

## 2015-02-25 DIAGNOSIS — I509 Heart failure, unspecified: Secondary | ICD-10-CM

## 2015-02-25 DIAGNOSIS — I482 Chronic atrial fibrillation, unspecified: Secondary | ICD-10-CM

## 2015-02-25 DIAGNOSIS — I50812 Chronic right heart failure: Secondary | ICD-10-CM

## 2015-02-25 LAB — CUP PACEART INCLINIC DEVICE CHECK
Battery Remaining Longevity: 19 mo
Date Time Interrogation Session: 20160713161308
Lead Channel Impedance Value: 0 Ohm
Lead Channel Pacing Threshold Amplitude: 0.5 V
Lead Channel Pacing Threshold Amplitude: 1 V
Lead Channel Pacing Threshold Pulse Width: 0.4 ms
Lead Channel Setting Pacing Amplitude: 2.5 V
Lead Channel Setting Pacing Pulse Width: 0.4 ms
Lead Channel Setting Pacing Pulse Width: 0.4 ms
Lead Channel Setting Sensing Sensitivity: 2.8 mV
MDC IDC MSMT BATTERY VOLTAGE: 2.9 V
MDC IDC MSMT LEADCHNL LV IMPEDANCE VALUE: 640 Ohm
MDC IDC MSMT LEADCHNL LV PACING THRESHOLD PULSEWIDTH: 0.4 ms
MDC IDC MSMT LEADCHNL RV IMPEDANCE VALUE: 452 Ohm
MDC IDC SET LEADCHNL LV PACING AMPLITUDE: 2 V
MDC IDC STAT BRADY RV PERCENT PACED: 92 %

## 2015-02-25 NOTE — Patient Instructions (Signed)
Medication Instructions:  NO CHANGES Labwork: NONE  Testing/Procedures: NONE  Follow-Up: Your physician recommends that you schedule a follow-up appointment in:  3 MONTHS  WITH DR Tusayan   Any Other Special Instructions Will Be Listed Below (If Applicable).

## 2015-02-25 NOTE — Progress Notes (Signed)
CRT-P device check in clinic. Normal device function. Thresholds, impedance consistent with previous measurements. Histograms appropriate for patient and level of activity. 1 ventricular high rate---11 beats. 100 Vs episodes---longest 32min5sec--- 0% of time. Device programmed with appropriate safety margins. Estimated longevity 1.56yrs. ROV w/ device clinic 05/27/15 (due to recalled device not able to changeout yet) & ROV w/ GT in 25mo.

## 2015-03-16 DEATH — deceased

## 2015-03-23 ENCOUNTER — Encounter: Payer: Self-pay | Admitting: Internal Medicine
